# Patient Record
Sex: Female | Born: 1935 | Race: Black or African American | Hispanic: No | Smoking: Never smoker
Health system: Southern US, Community
[De-identification: ages and names within clinical notes are randomized; demographics above are authoritative.]

## PROBLEM LIST (undated history)

## (undated) DIAGNOSIS — I209 Angina pectoris, unspecified: Secondary | ICD-10-CM

## (undated) DIAGNOSIS — N3281 Overactive bladder: Secondary | ICD-10-CM

## (undated) DIAGNOSIS — I454 Nonspecific intraventricular block: Secondary | ICD-10-CM

## (undated) DIAGNOSIS — G4700 Insomnia, unspecified: Secondary | ICD-10-CM

## (undated) DIAGNOSIS — H9190 Unspecified hearing loss, unspecified ear: Secondary | ICD-10-CM

## (undated) DIAGNOSIS — E785 Hyperlipidemia, unspecified: Secondary | ICD-10-CM

## (undated) DIAGNOSIS — K259 Gastric ulcer, unspecified as acute or chronic, without hemorrhage or perforation: Secondary | ICD-10-CM

## (undated) DIAGNOSIS — D62 Acute posthemorrhagic anemia: Principal | ICD-10-CM

## (undated) DIAGNOSIS — R32 Unspecified urinary incontinence: Secondary | ICD-10-CM

## (undated) DIAGNOSIS — N189 Chronic kidney disease, unspecified: Secondary | ICD-10-CM

## (undated) DIAGNOSIS — G4730 Sleep apnea, unspecified: Secondary | ICD-10-CM

## (undated) DIAGNOSIS — N6019 Diffuse cystic mastopathy of unspecified breast: Secondary | ICD-10-CM

## (undated) DIAGNOSIS — I872 Venous insufficiency (chronic) (peripheral): Secondary | ICD-10-CM

## (undated) DIAGNOSIS — E119 Type 2 diabetes mellitus without complications: Secondary | ICD-10-CM

## (undated) DIAGNOSIS — R269 Unspecified abnormalities of gait and mobility: Secondary | ICD-10-CM

## (undated) DIAGNOSIS — E039 Hypothyroidism, unspecified: Secondary | ICD-10-CM

## (undated) DIAGNOSIS — D691 Qualitative platelet defects: Secondary | ICD-10-CM

## (undated) DIAGNOSIS — E049 Nontoxic goiter, unspecified: Secondary | ICD-10-CM

## (undated) DIAGNOSIS — I10 Essential (primary) hypertension: Secondary | ICD-10-CM

## (undated) DIAGNOSIS — L0390 Cellulitis, unspecified: Secondary | ICD-10-CM

## (undated) DIAGNOSIS — R2681 Unsteadiness on feet: Secondary | ICD-10-CM

## (undated) DIAGNOSIS — M1990 Unspecified osteoarthritis, unspecified site: Secondary | ICD-10-CM

## (undated) DIAGNOSIS — E559 Vitamin D deficiency, unspecified: Secondary | ICD-10-CM

## (undated) DIAGNOSIS — R209 Unspecified disturbances of skin sensation: Secondary | ICD-10-CM

## (undated) DIAGNOSIS — K449 Diaphragmatic hernia without obstruction or gangrene: Secondary | ICD-10-CM

## (undated) DIAGNOSIS — K219 Gastro-esophageal reflux disease without esophagitis: Secondary | ICD-10-CM

## (undated) DIAGNOSIS — R42 Dizziness and giddiness: Secondary | ICD-10-CM

## (undated) DIAGNOSIS — I517 Cardiomegaly: Secondary | ICD-10-CM

## (undated) DIAGNOSIS — Z9289 Personal history of other medical treatment: Secondary | ICD-10-CM

## (undated) DIAGNOSIS — R413 Other amnesia: Secondary | ICD-10-CM

## (undated) DIAGNOSIS — K5900 Constipation, unspecified: Secondary | ICD-10-CM

## (undated) DIAGNOSIS — R5381 Other malaise: Secondary | ICD-10-CM

## (undated) DIAGNOSIS — R609 Edema, unspecified: Secondary | ICD-10-CM

## (undated) DIAGNOSIS — I2510 Atherosclerotic heart disease of native coronary artery without angina pectoris: Secondary | ICD-10-CM

## (undated) HISTORY — DX: Unspecified osteoarthritis, unspecified site: M19.90

## (undated) HISTORY — DX: Chronic kidney disease, unspecified: N18.9

## (undated) HISTORY — PX: CATARACT EXTRACTION, BILATERAL: SHX1313

## (undated) HISTORY — DX: Venous insufficiency (chronic) (peripheral): I87.2

## (undated) HISTORY — DX: Unsteadiness on feet: R26.81

## (undated) HISTORY — DX: Atherosclerotic heart disease of native coronary artery without angina pectoris: I25.10

## (undated) HISTORY — DX: Dizziness and giddiness: R42

## (undated) HISTORY — DX: Unspecified disturbances of skin sensation: R20.9

## (undated) HISTORY — DX: Diaphragmatic hernia without obstruction or gangrene: K44.9

## (undated) HISTORY — DX: Vitamin D deficiency, unspecified: E55.9

## (undated) HISTORY — DX: Diffuse cystic mastopathy of unspecified breast: N60.19

## (undated) HISTORY — DX: Overactive bladder: N32.81

## (undated) HISTORY — PX: TONSILLECTOMY: SUR1361

## (undated) HISTORY — DX: Acute posthemorrhagic anemia: D62

## (undated) HISTORY — DX: Sleep apnea, unspecified: G47.30

## (undated) HISTORY — DX: Unspecified abnormalities of gait and mobility: R26.9

## (undated) HISTORY — DX: Insomnia, unspecified: G47.00

## (undated) HISTORY — DX: Hyperlipidemia, unspecified: E78.5

## (undated) HISTORY — DX: Other malaise: R53.81

## (undated) HISTORY — DX: Constipation, unspecified: K59.00

## (undated) HISTORY — DX: Unspecified urinary incontinence: R32

## (undated) HISTORY — DX: Edema, unspecified: R60.9

## (undated) HISTORY — DX: Nonspecific intraventricular block: I45.4

---
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
## 1997-03-04 HISTORY — PX: ROTATOR CUFF REPAIR: SHX139

## 1998-01-11 ENCOUNTER — Other Ambulatory Visit: Admission: RE | Admit: 1998-01-11 | Discharge: 1998-01-11 | Payer: Self-pay | Admitting: *Deleted

## 1999-01-23 ENCOUNTER — Other Ambulatory Visit: Admission: RE | Admit: 1999-01-23 | Discharge: 1999-01-23 | Payer: Self-pay | Admitting: *Deleted

## 1999-02-23 ENCOUNTER — Encounter: Admission: RE | Admit: 1999-02-23 | Discharge: 1999-02-23 | Payer: Self-pay | Admitting: *Deleted

## 1999-02-23 ENCOUNTER — Encounter: Payer: Self-pay | Admitting: *Deleted

## 2000-02-04 ENCOUNTER — Other Ambulatory Visit: Admission: RE | Admit: 2000-02-04 | Discharge: 2000-02-04 | Payer: Self-pay | Admitting: *Deleted

## 2000-02-27 ENCOUNTER — Encounter: Payer: Self-pay | Admitting: *Deleted

## 2000-02-27 ENCOUNTER — Encounter: Admission: RE | Admit: 2000-02-27 | Discharge: 2000-02-27 | Payer: Self-pay | Admitting: *Deleted

## 2000-03-04 HISTORY — PX: HIP SURGERY: SHX245

## 2000-10-15 ENCOUNTER — Encounter: Payer: Self-pay | Admitting: Specialist

## 2000-10-15 ENCOUNTER — Inpatient Hospital Stay (HOSPITAL_COMMUNITY): Admission: EM | Admit: 2000-10-15 | Discharge: 2000-10-20 | Payer: Self-pay | Admitting: Emergency Medicine

## 2000-10-20 ENCOUNTER — Inpatient Hospital Stay (HOSPITAL_COMMUNITY)
Admission: RE | Admit: 2000-10-20 | Discharge: 2000-11-14 | Payer: Self-pay | Admitting: Physical Medicine & Rehabilitation

## 2000-11-10 ENCOUNTER — Encounter: Payer: Self-pay | Admitting: Specialist

## 2000-11-11 ENCOUNTER — Encounter: Payer: Self-pay | Admitting: Physical Medicine & Rehabilitation

## 2001-06-23 ENCOUNTER — Encounter: Payer: Self-pay | Admitting: Internal Medicine

## 2001-06-23 ENCOUNTER — Encounter: Admission: RE | Admit: 2001-06-23 | Discharge: 2001-06-23 | Payer: Self-pay | Admitting: Internal Medicine

## 2001-12-29 ENCOUNTER — Ambulatory Visit (HOSPITAL_COMMUNITY): Admission: RE | Admit: 2001-12-29 | Discharge: 2001-12-29 | Payer: Self-pay | Admitting: Gastroenterology

## 2002-07-13 ENCOUNTER — Encounter: Payer: Self-pay | Admitting: Internal Medicine

## 2002-07-13 ENCOUNTER — Encounter: Admission: RE | Admit: 2002-07-13 | Discharge: 2002-07-13 | Payer: Self-pay | Admitting: Internal Medicine

## 2003-09-09 ENCOUNTER — Encounter: Admission: RE | Admit: 2003-09-09 | Discharge: 2003-09-09 | Payer: Self-pay | Admitting: Internal Medicine

## 2004-11-22 ENCOUNTER — Encounter: Admission: RE | Admit: 2004-11-22 | Discharge: 2004-11-22 | Payer: Self-pay | Admitting: Internal Medicine

## 2005-07-25 ENCOUNTER — Emergency Department (HOSPITAL_COMMUNITY): Admission: EM | Admit: 2005-07-25 | Discharge: 2005-07-25 | Payer: Self-pay | Admitting: Family Medicine

## 2005-09-26 ENCOUNTER — Encounter: Admission: RE | Admit: 2005-09-26 | Discharge: 2005-10-07 | Payer: Self-pay | Admitting: Internal Medicine

## 2005-10-28 ENCOUNTER — Encounter: Admission: RE | Admit: 2005-10-28 | Discharge: 2006-01-26 | Payer: Self-pay | Admitting: Internal Medicine

## 2005-12-03 ENCOUNTER — Encounter: Admission: RE | Admit: 2005-12-03 | Discharge: 2006-03-03 | Payer: Self-pay | Admitting: Internal Medicine

## 2006-01-21 ENCOUNTER — Encounter: Admission: RE | Admit: 2006-01-21 | Discharge: 2006-01-21 | Payer: Self-pay | Admitting: Internal Medicine

## 2006-08-28 ENCOUNTER — Encounter: Admission: RE | Admit: 2006-08-28 | Discharge: 2006-11-26 | Payer: Self-pay | Admitting: Specialist

## 2006-12-02 ENCOUNTER — Encounter: Admission: RE | Admit: 2006-12-02 | Discharge: 2006-12-16 | Payer: Self-pay | Admitting: Specialist

## 2006-12-16 ENCOUNTER — Encounter
Admission: RE | Admit: 2006-12-16 | Discharge: 2006-12-16 | Payer: Self-pay | Admitting: Physical Medicine & Rehabilitation

## 2007-04-02 ENCOUNTER — Encounter: Admission: RE | Admit: 2007-04-02 | Discharge: 2007-04-02 | Payer: Self-pay | Admitting: Internal Medicine

## 2007-07-14 ENCOUNTER — Encounter: Admission: RE | Admit: 2007-07-14 | Discharge: 2007-07-14 | Payer: Self-pay | Admitting: Gastroenterology

## 2008-04-11 ENCOUNTER — Encounter: Admission: RE | Admit: 2008-04-11 | Discharge: 2008-04-11 | Payer: Self-pay | Admitting: Internal Medicine

## 2008-10-06 ENCOUNTER — Encounter: Admission: RE | Admit: 2008-10-06 | Discharge: 2008-10-06 | Payer: Self-pay | Admitting: Internal Medicine

## 2009-04-21 ENCOUNTER — Encounter: Admission: RE | Admit: 2009-04-21 | Discharge: 2009-04-21 | Payer: Self-pay | Admitting: Internal Medicine

## 2010-02-27 ENCOUNTER — Encounter
Admission: RE | Admit: 2010-02-27 | Discharge: 2010-02-27 | Payer: Self-pay | Source: Home / Self Care | Attending: Internal Medicine | Admitting: Internal Medicine

## 2010-05-16 ENCOUNTER — Other Ambulatory Visit: Payer: Self-pay | Admitting: Internal Medicine

## 2010-05-16 DIAGNOSIS — Z1231 Encounter for screening mammogram for malignant neoplasm of breast: Secondary | ICD-10-CM

## 2010-05-25 ENCOUNTER — Ambulatory Visit
Admission: RE | Admit: 2010-05-25 | Discharge: 2010-05-25 | Disposition: A | Discharge status: Home / Self Care | Payer: Medicare Other | Source: Ambulatory Visit | Attending: Internal Medicine | Admitting: Internal Medicine

## 2010-05-25 DIAGNOSIS — Z1231 Encounter for screening mammogram for malignant neoplasm of breast: Secondary | ICD-10-CM

## 2010-07-20 NOTE — H&P (Signed)
[place]  Patient:    Jessica Hawkins, Jessica Hawkins                      MRN: [id number] Adm. Date:  [id number] Attending:  Erasmo Leventhal Dictator:   Sammuel Cooper. Mahar, P.A.                         History and Physical  DATE OF BIRTH:  07-Apr-1922  CHIEF COMPLAINT:  Right hip and groin pain.  HISTORY OF PRESENT ILLNESS:  Earlier this evening, the patient was in her home in the kitchen.  She was near the sink and slipped on some water.  She felt her hip twist and she fell to the ground.  She did have the immediate onset of right hip and groin pain and was unable to bear weight at the time.  She called some family members, who in turn called the ambulance, and she was transported here to [facility] via ambulance.  Prior to the injury, she did ambulate with only a cane for assistance, she did live independently and cared for herself.  ALLERGIES:  No known drug allergies.  MEDICATIONS: 1. Synthroid 0.1 mg one p.o. q.d. 2. Hydrochlorothiazide 25 mg one p.o. q.d.  PAST MEDICAL HISTORY:  Significant for hypertension and hypothyroidism.  PAST SURGICAL HISTORY:  A thyroidectomy approximately 20 years ago secondary to a goiter, left rotator cuff surgery in 1999 and a tonsillectomy as a child.  SOCIAL HISTORY:  Patient denies tobacco use and denies alcohol use.  She is married and lives in a private residence in [place] with her husband and one son.  She has been having some difficulty ambulating prior to the fall but has been able to do so with the use of a cane.  REVIEW OF SYSTEMS:  Negative with the exception of some urinary incontinence.  PHYSICAL EXAMINATION:  VITAL SIGNS:  Blood pressure is 150/91.  Respirations 16 and unlabored.  Pulse is 76 and regular.  Temperature is 97.2.  O2 saturation is 100% on room air.  GENERAL APPEARANCE:  Patient is a 77 year old African-American female who is alert and oriented and in no acute  distress.  She is pleasant and cooperative to exam.  She is well-nourished and well-groomed.  HEENT:  Head is normocephalic, atraumatic.  Pupils are equal, round and reactive to light and extraocular movement is intact.  NECK:  Soft and supple to palpation.  No thyromegaly noted.  No bruits appreciated.  CHEST:  Clear to auscultation bilaterally, anterior and posterior.  No wheezes, rales, rhonchi, stridor or friction rubs.  BREASTS:  Not pertinent and not examined.  HEART:  Normal S1, S2.  Regular rate and rhythm.  No murmurs, gallops or rubs appreciated.  ABDOMEN:  Soft and supple to palpation.  Positive bowel sounds throughout. Nondistended, nontender.  No organomegaly noted.  GU:  Not pertinent and not performed.  EXTREMITIES:  Right lower extremity is externally rotated and shortened as compared to the left lower extremity.  Dorsalis pedis and posterior tibialis pulses are intact and equal bilaterally.  Sensation is grossly intact to light touch.  Motion is grossly intact; patient is moving foot and ankle well and toes well.  SKIN:  Intact without any lesions, rashes or disruptions in the skin.  X-RAY FINDINGS:  X-ray revealed a right hip intertrochanteric fracture.  IMPRESSION: 1. Right hip intertrochanteric fracture. 2. Hypertension. 3. Hypothyroidism.  PLAN:  Plan is to admit to Dr. Benny Lennert for an open reduction and internal fixation of the right hip using a compression screw and sideplate on October 15, 2000. DD:  10/15/00 TD:  10/15/00 Job: [id number]

## 2010-07-20 NOTE — Op Note (Signed)
   NAME:  Jessica Hawkins, Jessica Hawkins                         ACCOUNT NO.:  [id number]   MEDICAL RECORD NO.:  [id number]                   PATIENT TYPE:  AMB   LOCATION:  ENDO                                 FACILITY:  [facility]   PHYSICIAN:  Petra Kuba, M.D.                 DATE OF BIRTH:  04-Jan-1923   DATE OF PROCEDURE:  12/29/2001  DATE OF DISCHARGE:                                 OPERATIVE REPORT   PROCEDURE:  Colonoscopy.   INDICATIONS:  Patient with change in bowel habits, due for colonic  screening.  Consent was signed after risks, benefits, methods, and options  thoroughly discussed in the office on multiple occasions.   MEDICATIONS:  Demerol 50 mg, Versed 4 mg.   DESCRIPTION OF PROCEDURE:  Rectal inspection was pertinent for small  external hemorrhoids.  Digital exam was negative.  The video pediatric  adjustable colonoscope was inserted, with moderate difficulty advanced  around the colon to the cecum.  This did require rolling her on her back,  various abdominal pressures.  No abnormality was seen on insertion.  The  cecum was identified by the appendiceal orifice and the ileocecal valve.  The prep was adequate.  There was some liquid stool that required washing  and suctioning, but on slow withdrawal through the colon she had a tortuous,  long, looping colon.  When we fell back around a loop, we did try to  readvanced around the curves to decrease chances of missing things but on  slow withdrawal back to the rectum, no abnormalities were seen.  Once back  in the rectum, the scope was then retroflexed, pertinent for some tiny  internal hemorrhoids.  The scope was straightened and readvanced a short way  up the left side of the colon, air was suctioned, and scope removed.  The  patient tolerated the procedure well.  There was no obvious immediate  complication.   ENDOSCOPIC DIAGNOSES:  1. Internal-external hemorrhoids.  2. Tortuous, long, looping colon.  3. Otherwise normal to the  cecum.   PLAN:  Happy to see back p.r.n.  Return care back to Dr. Chilton Si for the  customary health care maintenance to include yearly rectals and guaiacs, and  consider repeat screening in five to 10 years if doing well medically, and  will leave that to Dr. Chilton Si.                                               Petra Kuba, M.D.    MEM/MEDQ  D:  12/29/2001  T:  12/29/2001  Job:  [id number]   cc:   Lenon Curt. Chilton Si, M.D.

## 2010-07-20 NOTE — Discharge Summary (Signed)
[place]. [facility]  Patient:    Jessica Hawkins, Jessica Hawkins Visit Number: [id number] MRN: [id number]          Service Type: [facility] Location: [id number] Attending Physician:  Herold Harms Dictated by:   Mcarthur Rossetti. Angiulli, P.A. Adm. Date:  10/20/2000 Disc. Date: 10/29/00   CC:         Dr. Hayden Rasmussen  Dr. Janey Greaser   Discharge Summary  DISCHARGE DIAGNOSES: 1. Right comminuted displaced intertrochanteric hip fracture, status post    open reduction and internal fixation October 15, 2000. 2. Hypokalemia. 3. Hypothyroidism. 4. Hypertension.  HISTORY OF PRESENT ILLNESS:  A 77 year old female admitted October 15, 2000, after a fall without loss of consciousness, sustaining a right comminuted displaced intertrochanteric hip fracture.  She underwent open reduction and internal fixation October 15, 2000, per Dr. Hayden Rasmussen.  She was placed on Coumadin for deep venous thrombosis prophylaxis and touchdown weightbearing. Postoperative pain management.  Mild hypokalemia at 3.2 and supplemented. Minimal assistance for ambulation with a standard walker, minimal assistance transfers.  Latest chemistries with an INR of 1.8.  Hemoglobin 10.2. Admitted for comprehensive rehab program.  PAST MEDICAL HISTORY:  See discharge diagnoses.  PAST SURGICAL HISTORY:  Tonsillectomy, hysterectomy, and left rotator cuff tear.  ALLERGIES:  No known drug allergies.  PRIMARY M.D.:  Dr. Janey Greaser.  MEDICATIONS PRIOR TO ADMISSION:  Synthroid and hydrochlorothiazide.  SOCIAL HISTORY:  No tobacco.  No alcohol.  She lives [place] her husband and [place]  She was independent with a cane prior to admission.  She lives in a two-level home and stays on the first floor. There are four steps to entry of home.  Son works night shift. Husband can assist on discharge.  HOSPITAL COURSE:  The patient did well while in rehabilitation services with therapies initiated on a  b.i.d. basis. The following issues were followed during the patients rehab course.  Pertaining to Ms. Tokar right comminuted displaced intertrochanteric hip fracture, she had undergone open reduction and internal fixation October 15, 2000. Surgical site healing nicely. Staples had been removed.  No signs of infection.  She was still touchdown weightbearing and would follow up with Dr. Valma Cava.  She continued on Coumadin for deep venous thrombosis prophylaxis. Venous Doppler studies prior to her discharge were negative and she would complete Coumadin protocol.  There were no bleeding episodes.  She remained on hormone supplement for hypothyroidism. She had early bouts of hypokalemia in her hospital course. This had resolved with latest potassium of 3.6.  Blood pressures remained controlled on low doses of hydrochlorothiazide. She had some mild postoperative anemia.  She continued on iron supplement. Latest hemoglobin 9.3. She remained asymptomatic with no chest pain, no shortness of breath.  Overall for her functional mobility, she was ambulating household functional distances with a walker, essentially independent to standby assist in all areas of activities of daily living, needing some assistance for right lower extremity after recent surgical repair of fracture.  Plan will be for home health physical, occupational therapy and a nurse.  LABORATORY:  Latest labs showed an INR of 2.4, hemoglobin 9.3, sodium 138, BUN 11, creatinine 0.6. She was to be discharged to home.  She did have one follow-up hemoglobin prior to discharge that also improved to 10.3.  DISCHARGE MEDICATIONS: 1. Coumadin daily with dose to be established at the time of discharge to be    completed by November 15, 2000.  Also, she had been  receiving 5 mg of    Coumadin. 2. Os-Cal 500 mg three times daily. 3. Synthroid 100 mcg daily. 4. Hydrochlorothiazide 25 mg daily. 5. Trinsicon one capsule twice daily. 6.  Tylox as needed for pain.  ACTIVITY:  Touchdown weightbearing with walker.  DIET:  Regular.  WOUND CARE:  Cleanse incision daily with warm soap and water.  SPECIAL INSTRUCTIONS:  No aspirin or ibuprofen while on Coumadin.  Home health nurse to be arranged for Coumadin therapy.  Home health physical and occupational therapy had also been arranged.  FOLLOW-UP:  She should follow up with Dr. Hayden Rasmussen in two weeks for follow-up of her recent hip repair, also with Dr. Janey Greaser for medical management. Dictated by:   Mcarthur Rossetti. Angiulli, P.A. Attending Physician:  Herold Harms DD:  10/28/00 TD:  10/28/00 Job: [id number]

## 2011-03-05 DIAGNOSIS — D62 Acute posthemorrhagic anemia: Secondary | ICD-10-CM

## 2011-03-05 DIAGNOSIS — D691 Qualitative platelet defects: Secondary | ICD-10-CM

## 2011-03-05 HISTORY — DX: Qualitative platelet defects: D69.1

## 2011-03-05 HISTORY — DX: Acute posthemorrhagic anemia: D62

## 2011-03-13 DIAGNOSIS — E119 Type 2 diabetes mellitus without complications: Secondary | ICD-10-CM | POA: Diagnosis not present

## 2011-03-13 DIAGNOSIS — I10 Essential (primary) hypertension: Secondary | ICD-10-CM | POA: Diagnosis not present

## 2011-03-13 DIAGNOSIS — Z23 Encounter for immunization: Secondary | ICD-10-CM | POA: Diagnosis not present

## 2011-03-13 DIAGNOSIS — E039 Hypothyroidism, unspecified: Secondary | ICD-10-CM | POA: Diagnosis not present

## 2011-03-20 DIAGNOSIS — R198 Other specified symptoms and signs involving the digestive system and abdomen: Secondary | ICD-10-CM | POA: Diagnosis not present

## 2011-03-20 DIAGNOSIS — K5900 Constipation, unspecified: Secondary | ICD-10-CM | POA: Diagnosis not present

## 2011-03-27 DIAGNOSIS — M1710 Unilateral primary osteoarthritis, unspecified knee: Secondary | ICD-10-CM | POA: Diagnosis not present

## 2011-03-27 DIAGNOSIS — IMO0002 Reserved for concepts with insufficient information to code with codable children: Secondary | ICD-10-CM | POA: Diagnosis not present

## 2011-03-28 DIAGNOSIS — E119 Type 2 diabetes mellitus without complications: Secondary | ICD-10-CM | POA: Diagnosis not present

## 2011-04-09 DIAGNOSIS — M5137 Other intervertebral disc degeneration, lumbosacral region: Secondary | ICD-10-CM | POA: Diagnosis not present

## 2011-04-09 DIAGNOSIS — M999 Biomechanical lesion, unspecified: Secondary | ICD-10-CM | POA: Diagnosis not present

## 2011-04-30 DIAGNOSIS — R42 Dizziness and giddiness: Secondary | ICD-10-CM | POA: Diagnosis not present

## 2011-04-30 DIAGNOSIS — R609 Edema, unspecified: Secondary | ICD-10-CM | POA: Diagnosis not present

## 2011-04-30 DIAGNOSIS — M2550 Pain in unspecified joint: Secondary | ICD-10-CM | POA: Diagnosis not present

## 2011-05-03 DIAGNOSIS — M79609 Pain in unspecified limb: Secondary | ICD-10-CM | POA: Diagnosis not present

## 2011-05-03 DIAGNOSIS — B351 Tinea unguium: Secondary | ICD-10-CM | POA: Diagnosis not present

## 2011-05-06 DIAGNOSIS — M1710 Unilateral primary osteoarthritis, unspecified knee: Secondary | ICD-10-CM | POA: Diagnosis not present

## 2011-05-06 DIAGNOSIS — IMO0002 Reserved for concepts with insufficient information to code with codable children: Secondary | ICD-10-CM | POA: Diagnosis not present

## 2011-05-07 DIAGNOSIS — M999 Biomechanical lesion, unspecified: Secondary | ICD-10-CM | POA: Diagnosis not present

## 2011-05-07 DIAGNOSIS — M5137 Other intervertebral disc degeneration, lumbosacral region: Secondary | ICD-10-CM | POA: Diagnosis not present

## 2011-05-13 DIAGNOSIS — M1710 Unilateral primary osteoarthritis, unspecified knee: Secondary | ICD-10-CM | POA: Diagnosis not present

## 2011-05-13 DIAGNOSIS — IMO0002 Reserved for concepts with insufficient information to code with codable children: Secondary | ICD-10-CM | POA: Diagnosis not present

## 2011-05-20 DIAGNOSIS — IMO0002 Reserved for concepts with insufficient information to code with codable children: Secondary | ICD-10-CM | POA: Diagnosis not present

## 2011-05-20 DIAGNOSIS — M1710 Unilateral primary osteoarthritis, unspecified knee: Secondary | ICD-10-CM | POA: Diagnosis not present

## 2011-05-25 ENCOUNTER — Emergency Department (INDEPENDENT_AMBULATORY_CARE_PROVIDER_SITE_OTHER)
Admission: EM | Admit: 2011-05-25 | Discharge: 2011-05-25 | Disposition: A | Discharge status: Home / Self Care | Payer: Medicare Other | Source: Home / Self Care | Attending: Emergency Medicine | Admitting: Emergency Medicine

## 2011-05-25 ENCOUNTER — Encounter (HOSPITAL_COMMUNITY): Payer: Self-pay | Admitting: Emergency Medicine

## 2011-05-25 DIAGNOSIS — L03119 Cellulitis of unspecified part of limb: Secondary | ICD-10-CM

## 2011-05-25 DIAGNOSIS — L02419 Cutaneous abscess of limb, unspecified: Secondary | ICD-10-CM | POA: Diagnosis not present

## 2011-05-25 DIAGNOSIS — R609 Edema, unspecified: Secondary | ICD-10-CM

## 2011-05-25 DIAGNOSIS — L03115 Cellulitis of right lower limb: Secondary | ICD-10-CM

## 2011-05-25 HISTORY — DX: Unspecified osteoarthritis, unspecified site: M19.90

## 2011-05-25 HISTORY — DX: Essential (primary) hypertension: I10

## 2011-05-25 MED ORDER — DOXYCYCLINE HYCLATE 100 MG PO TABS: 100.0000 mg | ORAL_TABLET | Freq: Two times a day (BID) | ORAL | Status: AC

## 2011-05-25 NOTE — ED Provider Notes (Signed)
History     CSN: [id number]  Arrival date & time 05/25/11  1002   First MD Initiated Contact with Patient 05/25/11 1015      Chief Complaint  Patient presents with  . Leg Swelling    (Consider location/radiation/quality/duration/timing/severity/associated sxs/prior treatment) HPI Comments: Patient presents urgent care with worsening of her right lower leg swelling with a localized area of redness and tenderness she has seen and observed spontaneous purulent drainage from it. Patient seemed to describe that it has been red and tender for at least 4-5 days. No recent injuries, no falls. Patient denies any further symptoms no fevers no chills no changes in appetite. Is accompanied by a relative. She recalls no injuries to the right lower leg and does not know where that scabbed area on her right inner aspect of her right lower leg came from.  The history is provided by the patient.    Past Medical History  Diagnosis Date  . Hypertension   . Diabetes mellitus   . Arthritis   . Osteoporosis     Past Surgical History  Procedure Date  . Hip surgery   . Thyroidectomy     No family history on file.  History  Substance Use Topics  . Smoking status: Never Smoker   . Smokeless tobacco: Not on file  . Alcohol Use: No    OB History    Grav Para Term Preterm Abortions TAB SAB Ect Mult Living                  Review of Systems  Constitutional: Negative for fever, chills, activity change, appetite change and fatigue.  HENT: Negative for neck pain and ear discharge.   Cardiovascular: Positive for leg swelling. Negative for chest pain and palpitations.  Skin: Positive for rash and wound.    Allergies  Review of patient's allergies indicates no known allergies.  Home Medications   Current Outpatient Rx  Name Route Sig Dispense Refill  . ASPIRIN 81 MG PO TABS Oral Take 81 mg by mouth daily.    . FUROSEMIDE 40 MG PO TABS Oral Take 40 mg by mouth daily.    Marland Kitchen GLUCOSAMINE  SULFATE PO Oral Take by mouth once.    Marland Kitchen SYNTHROID PO Oral Take 25 mcg by mouth.     Marland Kitchen LOSARTAN POTASSIUM-HCTZ 50-12.5 MG PO TABS Oral Take 1 tablet by mouth daily. Med stopped at last appt with physician in February    . OMEPRAZOLE 20 MG PO CPDR Oral Take 20 mg by mouth daily. Taken as needed    . DETROL PO Oral Take 1 capsule by mouth once.     Marland Kitchen DOXYCYCLINE HYCLATE 100 MG PO TABS Oral Take 1 tablet (100 mg total) by mouth 2 (two) times daily. 20 tablet 0    BP 168/63  Pulse 72  Temp(Src) 97.9 F (36.6 C) (Oral)  Resp 19  SpO2 100%  Physical Exam  Nursing note and vitals reviewed. Constitutional: No distress.  Pulmonary/Chest: No respiratory distress.  Musculoskeletal: She exhibits edema and tenderness.       Legs: Skin: Skin is warm. She is not diaphoretic. There is erythema.    ED Course  Procedures (including critical care time)  Labs Reviewed - No data to display No results found.   1. Cellulitis of right lower leg   2. Chronic edema       MDM  Right lower leg anterior aspect of tibia with a localized cellulitic process and associated tenderness  and soft tissue swelling superimposed on chronic bilateral edema. Patient looks comfortable is afebrile. We had to discern multiple medications she is chronically on some clear he frequently she is taking them including Lasix. Followup was recommended in 48 hours with [place] or her primary care Dr.        Jimmie Molly, MD 05/25/11 [phone]

## 2011-05-25 NOTE — Discharge Instructions (Signed)
  Return in 2 days for recheck, or followup with your primary care Dr. Melene Muller with antibiotics today, expect tenderness to improve in the next 48 hours if worsening swelling and tenderness or any fevers (above 100.4) should go to the emergency department for further treatment.  Cellulitis Cellulitis is an infection of the tissue under the skin. The infected area is usually red and tender. This is caused by germs. These germs enter the body through cuts or sores. This usually happens in the arms or lower legs. HOME CARE   Take your medicine as told. Finish it even if you start to feel better.   If the infection is on the arm or leg, keep it raised (elevated).   Use a warm cloth on the infected area several times a day.   See your doctor for a follow-up visit as told.  GET HELP RIGHT AWAY IF:   You are tired or confused.   You throw up (vomit).   You have watery poop (diarrhea).   You feel ill and have muscle aches.   You have a fever.  MAKE SURE YOU:   Understand these instructions.   Will watch your condition.   Will get help right away if you are not doing well or get worse.  Document Released: 08/07/2007 Document Revised: 02/07/2011 Document Reviewed: 01/20/2009 [facility] Patient Information 2012 [place], [place].

## 2011-05-25 NOTE — ED Notes (Signed)
Son returned with medications

## 2011-05-25 NOTE — ED Notes (Signed)
Bilateral leg swelling.  Reports chronic leg swelling, but over the past few days swelling has increased and is more than she has ever had.  3 plus edema.  Patient also has a scabbed area to right inner, mid-lower leg.  Redness and increased tenderness around this wound.  Patient reports pus drainage.  Noted much smaller area to left, lateral lower leg.  Unknown injuries.

## 2011-05-25 NOTE — ED Notes (Signed)
Son has gone to patient's home to get medication

## 2011-05-27 ENCOUNTER — Emergency Department (INDEPENDENT_AMBULATORY_CARE_PROVIDER_SITE_OTHER)
Admission: EM | Admit: 2011-05-27 | Discharge: 2011-05-27 | Disposition: A | Discharge status: Home / Self Care | Payer: Medicare Other | Source: Home / Self Care | Attending: Emergency Medicine | Admitting: Emergency Medicine

## 2011-05-27 ENCOUNTER — Encounter (HOSPITAL_COMMUNITY): Payer: Self-pay | Admitting: *Deleted

## 2011-05-27 DIAGNOSIS — L0291 Cutaneous abscess, unspecified: Secondary | ICD-10-CM | POA: Diagnosis not present

## 2011-05-27 DIAGNOSIS — L0390 Cellulitis, unspecified: Secondary | ICD-10-CM

## 2011-05-27 NOTE — Discharge Instructions (Signed)
Your exam was improved in comparison with 2 days ago continue with your antibiotics as discussed if any further concerns or any worsening should return for recheck.   Cellulitis Cellulitis is an infection of the tissue under the skin. The infected area is usually red and tender. This is caused by germs. These germs enter the body through cuts or sores. This usually happens in the arms or lower legs. HOME CARE   Take your medicine as told. Finish it even if you start to feel better.   If the infection is on the arm or leg, keep it raised (elevated).   Use a warm cloth on the infected area several times a day.   See your doctor for a follow-up visit as told.  GET HELP RIGHT AWAY IF:   You are tired or confused.   You throw up (vomit).   You have watery poop (diarrhea).   You feel ill and have muscle aches.   You have a fever.  MAKE SURE YOU:   Understand these instructions.   Will watch your condition.   Will get help right away if you are not doing well or get worse.  Document Released: 08/07/2007 Document Revised: 02/07/2011 Document Reviewed: 01/20/2009 [facility] Patient Information 2012 [place], [place].

## 2011-05-27 NOTE — ED Notes (Signed)
Pt  Reports   She  Is  Here  For  Wound  Check of  r  Lower  Leg     Seen  ucc  2  Days  ago

## 2011-05-27 NOTE — ED Provider Notes (Signed)
History     CSN: [id number]  Arrival date & time 05/27/11  1152   First MD Initiated Contact with Patient 05/27/11 1317      Chief Complaint  Patient presents with  . Wound Check    (Consider location/radiation/quality/duration/timing/severity/associated sxs/prior treatment) HPI Comments: Patient returns today for followup for right lower leg cellulitis. "Feels and looks like the swelling is going down". Home taking the antibiotics as you told me" its. I think is getting better".  Patient denies any fevers, chills, or worsening swelling or redness.  Patient is a 78 y.o. female presenting with wound check. The history is provided by the patient.  Wound Check  She was treated in the ED 5 to 10 days ago. Treatments since wound repair include oral antibiotics. There has been no drainage from the wound. The redness has improved. The swelling has improved. The pain has not changed.    Past Medical History  Diagnosis Date  . Hypertension   . Diabetes mellitus   . Arthritis   . Osteoporosis     Past Surgical History  Procedure Date  . Hip surgery   . Thyroidectomy     No family history on file.  History  Substance Use Topics  . Smoking status: Never Smoker   . Smokeless tobacco: Not on file  . Alcohol Use: No    OB History    Grav Para Term Preterm Abortions TAB SAB Ect Mult Living                  Review of Systems  Constitutional: Negative for fever and chills.  Cardiovascular: Positive for leg swelling.  Musculoskeletal: Negative for joint swelling.  Skin: Positive for wound.    Allergies  Review of patient's allergies indicates no known allergies.  Home Medications   Current Outpatient Rx  Name Route Sig Dispense Refill  . ASPIRIN 81 MG PO TABS Oral Take 81 mg by mouth daily.    Marland Kitchen DOXYCYCLINE HYCLATE 100 MG PO TABS Oral Take 1 tablet (100 mg total) by mouth 2 (two) times daily. 20 tablet 0  . FUROSEMIDE 40 MG PO TABS Oral Take 40 mg by mouth daily.      Marland Kitchen GLUCOSAMINE SULFATE PO Oral Take by mouth once.    Marland Kitchen SYNTHROID PO Oral Take 25 mcg by mouth.     Marland Kitchen LOSARTAN POTASSIUM-HCTZ 50-12.5 MG PO TABS Oral Take 1 tablet by mouth daily. Med stopped at last appt with physician in February    . OMEPRAZOLE 20 MG PO CPDR Oral Take 20 mg by mouth daily. Taken as needed    . DETROL PO Oral Take 1 capsule by mouth once.       BP 139/51  Pulse 76  Temp(Src) 98.2 F (36.8 C) (Oral)  Resp 20  SpO2 100%  Physical Exam  Nursing note and vitals reviewed. Constitutional: She is oriented to person, place, and time. She appears well-developed and well-nourished.  Musculoskeletal: She exhibits tenderness.       Right ankle: She exhibits swelling.       Legs: Neurological: She is oriented to person, place, and time.  Skin: Rash noted. There is erythema.    ED Course  Procedures (including critical care time)  Labs Reviewed - No data to display No results found.   1. Cellulitis       MDM  Followup visit for right lower extremity cellulitis. Patient and myself believe moderate improvement decreased erythema and decreased swelling tenderness seem to  have persisted. Patient encouraged to continue with antibiotics to return if any worsening or new symptoms patient agree with plan of care and understood instructions        Jimmie Molly, MD 05/27/11 1453

## 2011-05-28 ENCOUNTER — Telehealth (HOSPITAL_COMMUNITY): Payer: Self-pay | Admitting: *Deleted

## 2011-05-28 NOTE — ED Notes (Signed)
Pt. called on VM @ 1346 and called again now asking if she was infectious to others with her cellulitis. States it is getting better, and is taking her antibiotics.  States her son is coming home from his group home for Easter. I told her she did not have any cultures done, but she has been on Doxycycline since 3/23 so she is not infectious to others. Pt. very happy with this information and thanked me. [name] 05/28/2011

## 2011-05-29 ENCOUNTER — Ambulatory Visit
Admission: RE | Admit: 2011-05-29 | Discharge: 2011-05-29 | Disposition: A | Discharge status: Home / Self Care | Payer: Medicare Other | Source: Ambulatory Visit | Attending: Internal Medicine | Admitting: Internal Medicine

## 2011-05-29 ENCOUNTER — Other Ambulatory Visit: Payer: Self-pay | Admitting: Internal Medicine

## 2011-05-29 DIAGNOSIS — L0291 Cutaneous abscess, unspecified: Secondary | ICD-10-CM | POA: Diagnosis not present

## 2011-05-29 DIAGNOSIS — I10 Essential (primary) hypertension: Secondary | ICD-10-CM | POA: Diagnosis not present

## 2011-05-29 DIAGNOSIS — S81809A Unspecified open wound, unspecified lower leg, initial encounter: Secondary | ICD-10-CM | POA: Diagnosis not present

## 2011-05-29 DIAGNOSIS — T148XXA Other injury of unspecified body region, initial encounter: Secondary | ICD-10-CM

## 2011-05-29 DIAGNOSIS — L0390 Cellulitis, unspecified: Secondary | ICD-10-CM | POA: Diagnosis not present

## 2011-05-29 DIAGNOSIS — E119 Type 2 diabetes mellitus without complications: Secondary | ICD-10-CM | POA: Diagnosis not present

## 2011-05-29 DIAGNOSIS — R269 Unspecified abnormalities of gait and mobility: Secondary | ICD-10-CM | POA: Diagnosis not present

## 2011-05-29 DIAGNOSIS — R609 Edema, unspecified: Secondary | ICD-10-CM | POA: Diagnosis not present

## 2011-06-13 DIAGNOSIS — M25569 Pain in unspecified knee: Secondary | ICD-10-CM | POA: Diagnosis not present

## 2011-06-13 DIAGNOSIS — M159 Polyosteoarthritis, unspecified: Secondary | ICD-10-CM | POA: Diagnosis not present

## 2011-06-17 DIAGNOSIS — M159 Polyosteoarthritis, unspecified: Secondary | ICD-10-CM | POA: Diagnosis not present

## 2011-06-17 DIAGNOSIS — M25569 Pain in unspecified knee: Secondary | ICD-10-CM | POA: Diagnosis not present

## 2011-06-19 DIAGNOSIS — M159 Polyosteoarthritis, unspecified: Secondary | ICD-10-CM | POA: Diagnosis not present

## 2011-06-19 DIAGNOSIS — M25569 Pain in unspecified knee: Secondary | ICD-10-CM | POA: Diagnosis not present

## 2011-06-21 DIAGNOSIS — M25569 Pain in unspecified knee: Secondary | ICD-10-CM | POA: Diagnosis not present

## 2011-06-21 DIAGNOSIS — M159 Polyosteoarthritis, unspecified: Secondary | ICD-10-CM | POA: Diagnosis not present

## 2011-06-24 DIAGNOSIS — M159 Polyosteoarthritis, unspecified: Secondary | ICD-10-CM | POA: Diagnosis not present

## 2011-06-24 DIAGNOSIS — M25569 Pain in unspecified knee: Secondary | ICD-10-CM | POA: Diagnosis not present

## 2011-06-26 DIAGNOSIS — M25569 Pain in unspecified knee: Secondary | ICD-10-CM | POA: Diagnosis not present

## 2011-06-26 DIAGNOSIS — M159 Polyosteoarthritis, unspecified: Secondary | ICD-10-CM | POA: Diagnosis not present

## 2011-06-28 DIAGNOSIS — M159 Polyosteoarthritis, unspecified: Secondary | ICD-10-CM | POA: Diagnosis not present

## 2011-06-28 DIAGNOSIS — M25569 Pain in unspecified knee: Secondary | ICD-10-CM | POA: Diagnosis not present

## 2011-06-29 ENCOUNTER — Emergency Department (HOSPITAL_COMMUNITY): Payer: Medicare Other

## 2011-06-29 ENCOUNTER — Encounter (HOSPITAL_COMMUNITY): Payer: Self-pay | Admitting: Neurology

## 2011-06-29 ENCOUNTER — Inpatient Hospital Stay (HOSPITAL_COMMUNITY): Payer: Medicare Other

## 2011-06-29 ENCOUNTER — Encounter (HOSPITAL_COMMUNITY): Payer: Self-pay | Admitting: Anesthesiology

## 2011-06-29 ENCOUNTER — Encounter (HOSPITAL_COMMUNITY)
Admission: EM | Disposition: A | Discharge status: Skilled Nursing Facility | Payer: Self-pay | Source: Home / Self Care | Attending: Family Medicine

## 2011-06-29 ENCOUNTER — Inpatient Hospital Stay (HOSPITAL_COMMUNITY): Payer: Medicare Other | Admitting: Anesthesiology

## 2011-06-29 ENCOUNTER — Inpatient Hospital Stay (HOSPITAL_COMMUNITY)
Admission: EM | Admit: 2011-06-29 | Discharge: 2011-07-08 | DRG: 481 | Disposition: A | Discharge status: Skilled Nursing Facility | Payer: Medicare Other | Attending: Family Medicine | Admitting: Family Medicine

## 2011-06-29 DIAGNOSIS — M1990 Unspecified osteoarthritis, unspecified site: Secondary | ICD-10-CM | POA: Diagnosis not present

## 2011-06-29 DIAGNOSIS — N179 Acute kidney failure, unspecified: Secondary | ICD-10-CM | POA: Diagnosis not present

## 2011-06-29 DIAGNOSIS — Y92410 Unspecified street and highway as the place of occurrence of the external cause: Secondary | ICD-10-CM

## 2011-06-29 DIAGNOSIS — Z7982 Long term (current) use of aspirin: Secondary | ICD-10-CM | POA: Diagnosis not present

## 2011-06-29 DIAGNOSIS — S7223XA Displaced subtrochanteric fracture of unspecified femur, initial encounter for closed fracture: Secondary | ICD-10-CM | POA: Diagnosis not present

## 2011-06-29 DIAGNOSIS — S72143A Displaced intertrochanteric fracture of unspecified femur, initial encounter for closed fracture: Secondary | ICD-10-CM

## 2011-06-29 DIAGNOSIS — S2220XA Unspecified fracture of sternum, initial encounter for closed fracture: Secondary | ICD-10-CM | POA: Diagnosis not present

## 2011-06-29 DIAGNOSIS — E119 Type 2 diabetes mellitus without complications: Secondary | ICD-10-CM | POA: Diagnosis present

## 2011-06-29 DIAGNOSIS — D691 Qualitative platelet defects: Secondary | ICD-10-CM

## 2011-06-29 DIAGNOSIS — R079 Chest pain, unspecified: Secondary | ICD-10-CM | POA: Diagnosis not present

## 2011-06-29 DIAGNOSIS — E039 Hypothyroidism, unspecified: Secondary | ICD-10-CM | POA: Diagnosis present

## 2011-06-29 DIAGNOSIS — Z79899 Other long term (current) drug therapy: Secondary | ICD-10-CM

## 2011-06-29 DIAGNOSIS — D62 Acute posthemorrhagic anemia: Secondary | ICD-10-CM | POA: Diagnosis not present

## 2011-06-29 DIAGNOSIS — K2100 Gastro-esophageal reflux disease with esophagitis, without bleeding: Secondary | ICD-10-CM | POA: Diagnosis not present

## 2011-06-29 DIAGNOSIS — I10 Essential (primary) hypertension: Secondary | ICD-10-CM | POA: Diagnosis present

## 2011-06-29 DIAGNOSIS — J90 Pleural effusion, not elsewhere classified: Secondary | ICD-10-CM | POA: Diagnosis not present

## 2011-06-29 DIAGNOSIS — M25559 Pain in unspecified hip: Secondary | ICD-10-CM | POA: Diagnosis not present

## 2011-06-29 DIAGNOSIS — R58 Hemorrhage, not elsewhere classified: Secondary | ICD-10-CM | POA: Diagnosis not present

## 2011-06-29 DIAGNOSIS — M12869 Other specific arthropathies, not elsewhere classified, unspecified knee: Secondary | ICD-10-CM | POA: Diagnosis present

## 2011-06-29 DIAGNOSIS — K5900 Constipation, unspecified: Secondary | ICD-10-CM | POA: Diagnosis not present

## 2011-06-29 DIAGNOSIS — S72142A Displaced intertrochanteric fracture of left femur, initial encounter for closed fracture: Secondary | ICD-10-CM | POA: Diagnosis present

## 2011-06-29 DIAGNOSIS — R109 Unspecified abdominal pain: Secondary | ICD-10-CM | POA: Diagnosis not present

## 2011-06-29 DIAGNOSIS — M810 Age-related osteoporosis without current pathological fracture: Secondary | ICD-10-CM | POA: Diagnosis present

## 2011-06-29 DIAGNOSIS — M069 Rheumatoid arthritis, unspecified: Secondary | ICD-10-CM | POA: Diagnosis not present

## 2011-06-29 DIAGNOSIS — S20219A Contusion of unspecified front wall of thorax, initial encounter: Secondary | ICD-10-CM | POA: Diagnosis present

## 2011-06-29 DIAGNOSIS — M542 Cervicalgia: Secondary | ICD-10-CM | POA: Diagnosis not present

## 2011-06-29 DIAGNOSIS — D696 Thrombocytopenia, unspecified: Secondary | ICD-10-CM | POA: Diagnosis not present

## 2011-06-29 DIAGNOSIS — S300XXA Contusion of lower back and pelvis, initial encounter: Secondary | ICD-10-CM | POA: Diagnosis not present

## 2011-06-29 DIAGNOSIS — R51 Headache: Secondary | ICD-10-CM | POA: Diagnosis not present

## 2011-06-29 DIAGNOSIS — J9819 Other pulmonary collapse: Secondary | ICD-10-CM | POA: Diagnosis not present

## 2011-06-29 DIAGNOSIS — S72009D Fracture of unspecified part of neck of unspecified femur, subsequent encounter for closed fracture with routine healing: Secondary | ICD-10-CM | POA: Diagnosis not present

## 2011-06-29 DIAGNOSIS — IMO0002 Reserved for concepts with insufficient information to code with codable children: Secondary | ICD-10-CM | POA: Diagnosis not present

## 2011-06-29 DIAGNOSIS — S301XXA Contusion of abdominal wall, initial encounter: Secondary | ICD-10-CM | POA: Diagnosis not present

## 2011-06-29 DIAGNOSIS — J8410 Pulmonary fibrosis, unspecified: Secondary | ICD-10-CM | POA: Diagnosis not present

## 2011-06-29 DIAGNOSIS — K219 Gastro-esophageal reflux disease without esophagitis: Secondary | ICD-10-CM | POA: Diagnosis not present

## 2011-06-29 DIAGNOSIS — S72009A Fracture of unspecified part of neck of unspecified femur, initial encounter for closed fracture: Secondary | ICD-10-CM | POA: Diagnosis not present

## 2011-06-29 DIAGNOSIS — I517 Cardiomegaly: Secondary | ICD-10-CM | POA: Diagnosis not present

## 2011-06-29 DIAGNOSIS — Z5189 Encounter for other specified aftercare: Secondary | ICD-10-CM | POA: Diagnosis not present

## 2011-06-29 HISTORY — PX: FEMUR IM NAIL: SHX1597

## 2011-06-29 HISTORY — DX: Essential (primary) hypertension: I10

## 2011-06-29 HISTORY — DX: Gastro-esophageal reflux disease without esophagitis: K21.9

## 2011-06-29 HISTORY — DX: Hypothyroidism, unspecified: E03.9

## 2011-06-29 HISTORY — DX: Qualitative platelet defects: D69.1

## 2011-06-29 LAB — DIFFERENTIAL
Eosinophils Relative: 1 % (ref 0–5)
Lymphocytes Relative: 34 % (ref 12–46)
Lymphs Abs: 3.1 10*3/uL (ref 0.7–4.0)
Monocytes Relative: 7 % (ref 3–12)
Neutro Abs: 5.3 10*3/uL (ref 1.7–7.7)

## 2011-06-29 LAB — RAPID URINE DRUG SCREEN, HOSP PERFORMED
Barbiturates: NOT DETECTED
Cocaine: NOT DETECTED

## 2011-06-29 LAB — CBC
HCT: 36.5 % (ref 36.0–46.0)
Hemoglobin: 12 g/dL (ref 12.0–15.0)
MCHC: 32.9 g/dL (ref 30.0–36.0)
Platelets: 168 10*3/uL (ref 150–400)
RBC: 4.4 MIL/uL (ref 3.87–5.11)
RDW: 14.6 % (ref 11.5–15.5)
WBC: 9.1 10*3/uL (ref 4.0–10.5)

## 2011-06-29 LAB — COMPREHENSIVE METABOLIC PANEL
ALT: 23 U/L (ref 0–35)
Albumin: 3.4 g/dL — ABNORMAL LOW (ref 3.5–5.2)
Alkaline Phosphatase: 92 U/L (ref 39–117)
CO2: 27 mEq/L (ref 19–32)
Calcium: 9.4 mg/dL (ref 8.4–10.5)
Creatinine, Ser: 0.64 mg/dL (ref 0.50–1.10)
GFR calc non Af Amer: 79 mL/min — ABNORMAL LOW (ref >90)
Total Bilirubin: 0.4 mg/dL (ref 0.3–1.2)

## 2011-06-29 LAB — URINALYSIS, ROUTINE W REFLEX MICROSCOPIC
Glucose, UA: NEGATIVE mg/dL
Nitrite: NEGATIVE
Protein, ur: 100 mg/dL — AB
Urobilinogen, UA: 1 mg/dL (ref 0.0–1.0)
pH: 7 (ref 5.0–8.0)

## 2011-06-29 LAB — POCT I-STAT, CHEM 8
BUN: 20 mg/dL (ref 6–23)
Calcium, Ion: 1.17 mmol/L (ref 1.12–1.32)
Chloride: 107 mEq/L (ref 96–112)
Glucose, Bld: 128 mg/dL — ABNORMAL HIGH (ref 70–99)
TCO2: 28 mmol/L (ref 0–100)

## 2011-06-29 LAB — ABO/RH: ABO/RH(D): O POS

## 2011-06-29 SURGERY — INSERTION, INTRAMEDULLARY ROD, FEMUR
Anesthesia: General | Site: Hip | Laterality: Left | Wound class: Clean

## 2011-06-29 MED ORDER — METHOCARBAMOL 100 MG/ML IJ SOLN
500.0000 mg | Freq: Four times a day (QID) | INTRAVENOUS | Status: DC | PRN
  Filled 2011-06-29: qty 5

## 2011-06-29 MED ORDER — ADULT MULTIVITAMIN W/MINERALS CH
1.0000 | ORAL_TABLET | Freq: Every day | ORAL | Status: DC
  Administered 2011-06-30 – 2011-07-08 (×9): 1 via ORAL
  Filled 2011-06-29 (×9): qty 1

## 2011-06-29 MED ORDER — PHENOL 1.4 % MT LIQD: 1.0000 | OROMUCOSAL | Status: DC | PRN

## 2011-06-29 MED ORDER — ONDANSETRON HCL 4 MG PO TABS: 4.0000 mg | ORAL_TABLET | Freq: Four times a day (QID) | ORAL | Status: DC | PRN

## 2011-06-29 MED ORDER — PANTOPRAZOLE SODIUM 40 MG PO TBEC
40.0000 mg | DELAYED_RELEASE_TABLET | Freq: Every day | ORAL | Status: DC
  Administered 2011-06-30 – 2011-07-07 (×8): 40 mg via ORAL
  Filled 2011-06-29 (×7): qty 1

## 2011-06-29 MED ORDER — CLONIDINE HCL 0.1 MG PO TABS
0.1000 mg | ORAL_TABLET | Freq: Four times a day (QID) | ORAL | Status: DC | PRN
  Filled 2011-06-29: qty 1

## 2011-06-29 MED ORDER — IOHEXOL 300 MG/ML  SOLN
100.0000 mL | Freq: Once | INTRAMUSCULAR | Status: AC | PRN
  Administered 2011-06-29: 100 mL via INTRAVENOUS

## 2011-06-29 MED ORDER — HYDROCODONE-ACETAMINOPHEN 5-325 MG PO TABS
1.0000 | ORAL_TABLET | ORAL | Status: DC | PRN
  Administered 2011-06-30 – 2011-07-01 (×3): 1 via ORAL
  Administered 2011-07-01: 2 via ORAL
  Administered 2011-07-02: 1 via ORAL
  Administered 2011-07-02 – 2011-07-03 (×2): 2 via ORAL
  Administered 2011-07-03 – 2011-07-08 (×13): 1 via ORAL
  Filled 2011-06-29 (×10): qty 1
  Filled 2011-06-29: qty 2
  Filled 2011-06-29 (×2): qty 1
  Filled 2011-06-29: qty 2
  Filled 2011-06-29 (×2): qty 1
  Filled 2011-06-29: qty 2
  Filled 2011-06-29 (×3): qty 1

## 2011-06-29 MED ORDER — DOCUSATE SODIUM 100 MG PO CAPS
100.0000 mg | ORAL_CAPSULE | Freq: Two times a day (BID) | ORAL | Status: DC
  Administered 2011-06-30 – 2011-07-06 (×14): 100 mg via ORAL
  Filled 2011-06-29 (×15): qty 1

## 2011-06-29 MED ORDER — CALCIUM CARBONATE 1250 (500 CA) MG PO TABS
1500.0000 mg | ORAL_TABLET | Freq: Two times a day (BID) | ORAL | Status: DC
  Administered 2011-06-30 – 2011-07-08 (×17): 1250 mg via ORAL
  Filled 2011-06-29 (×19): qty 1

## 2011-06-29 MED ORDER — ACETAMINOPHEN 10 MG/ML IV SOLN
INTRAVENOUS | Status: DC | PRN
  Administered 2011-06-29: 1000 mg via INTRAVENOUS

## 2011-06-29 MED ORDER — CEFAZOLIN SODIUM-DEXTROSE 2-3 GM-% IV SOLR
2.0000 g | Freq: Three times a day (TID) | INTRAVENOUS | Status: DC
  Administered 2011-06-30: 2 g via INTRAVENOUS
  Filled 2011-06-29 (×4): qty 50

## 2011-06-29 MED ORDER — HYDROMORPHONE HCL PF 1 MG/ML IJ SOLN: 0.2500 mg | INTRAMUSCULAR | Status: DC | PRN

## 2011-06-29 MED ORDER — SODIUM CHLORIDE 0.9 % IV SOLN
INTRAVENOUS | Status: DC
  Administered 2011-06-30: 02:00:00 via INTRAVENOUS

## 2011-06-29 MED ORDER — HYDROCODONE-ACETAMINOPHEN 5-325 MG PO TABS
1.0000 | ORAL_TABLET | ORAL | Status: DC | PRN
Start: 2011-06-29 — End: 2011-06-29

## 2011-06-29 MED ORDER — POTASSIUM CHLORIDE IN NACL 20-0.9 MEQ/L-% IV SOLN
INTRAVENOUS | Status: DC
  Filled 2011-06-29: qty 1000

## 2011-06-29 MED ORDER — LACTATED RINGERS IV SOLN
INTRAVENOUS | Status: DC | PRN
  Administered 2011-06-29 (×2): via INTRAVENOUS

## 2011-06-29 MED ORDER — FUROSEMIDE 40 MG PO TABS
40.0000 mg | ORAL_TABLET | Freq: Every day | ORAL | Status: DC
  Administered 2011-06-30 – 2011-07-03 (×4): 40 mg via ORAL
  Filled 2011-06-29 (×4): qty 1

## 2011-06-29 MED ORDER — FERROUS SULFATE 325 (65 FE) MG PO TABS
325.0000 mg | ORAL_TABLET | Freq: Two times a day (BID) | ORAL | Status: DC
  Administered 2011-06-30 – 2011-07-04 (×10): 325 mg via ORAL
  Filled 2011-06-29 (×11): qty 1

## 2011-06-29 MED ORDER — METOPROLOL TARTRATE 25 MG PO TABS
25.0000 mg | ORAL_TABLET | Freq: Two times a day (BID) | ORAL | Status: DC
  Administered 2011-06-30 (×2): 25 mg via ORAL
  Filled 2011-06-29 (×3): qty 1

## 2011-06-29 MED ORDER — METOPROLOL TARTRATE 1 MG/ML IV SOLN
5.0000 mg | INTRAVENOUS | Status: DC | PRN
  Filled 2011-06-29: qty 5

## 2011-06-29 MED ORDER — ACETAMINOPHEN 325 MG PO TABS
650.0000 mg | ORAL_TABLET | Freq: Four times a day (QID) | ORAL | Status: DC | PRN
  Administered 2011-06-30: 650 mg via ORAL
  Filled 2011-06-29 (×2): qty 2

## 2011-06-29 MED ORDER — METHOCARBAMOL 500 MG PO TABS
500.0000 mg | ORAL_TABLET | Freq: Four times a day (QID) | ORAL | Status: DC | PRN
  Administered 2011-07-04 – 2011-07-08 (×3): 500 mg via ORAL
  Filled 2011-06-29 (×4): qty 1

## 2011-06-29 MED ORDER — ENOXAPARIN SODIUM 30 MG/0.3ML ~~LOC~~ SOLN
30.0000 mg | Freq: Two times a day (BID) | SUBCUTANEOUS | Status: DC
  Administered 2011-06-30 – 2011-07-01 (×3): 30 mg via SUBCUTANEOUS
  Filled 2011-06-29 (×6): qty 0.3

## 2011-06-29 MED ORDER — ACETAMINOPHEN 650 MG RE SUPP: 650.0000 mg | Freq: Four times a day (QID) | RECTAL | Status: DC | PRN

## 2011-06-29 MED ORDER — MORPHINE SULFATE 2 MG/ML IJ SOLN
0.5000 mg | INTRAMUSCULAR | Status: DC | PRN
  Administered 2011-06-30 (×2): 0.5 mg via INTRAVENOUS
  Filled 2011-06-29 (×2): qty 1

## 2011-06-29 MED ORDER — PROPOFOL 10 MG/ML IV EMUL
INTRAVENOUS | Status: DC | PRN
  Administered 2011-06-29: 50 ug/kg/min via INTRAVENOUS
  Administered 2011-06-29: 25 ug/kg/min via INTRAVENOUS

## 2011-06-29 MED ORDER — CENTRUM PO LIQD: 5.0000 mL | Freq: Every day | ORAL | Status: DC

## 2011-06-29 MED ORDER — CEFAZOLIN SODIUM-DEXTROSE 2-3 GM-% IV SOLR: 2.0000 g | Freq: Once | INTRAVENOUS | Status: DC

## 2011-06-29 MED ORDER — PROPOFOL 10 MG/ML IV EMUL
INTRAVENOUS | Status: DC | PRN
  Administered 2011-06-29: 20 mg via INTRAVENOUS
  Administered 2011-06-29: 30 mg via INTRAVENOUS

## 2011-06-29 MED ORDER — FENTANYL CITRATE 0.05 MG/ML IJ SOLN
50.0000 ug | Freq: Once | INTRAMUSCULAR | Status: AC
  Administered 2011-06-29: 50 ug via INTRAVENOUS
  Filled 2011-06-29: qty 2

## 2011-06-29 MED ORDER — SODIUM CHLORIDE 0.9 % IV BOLUS (SEPSIS)
500.0000 mL | Freq: Once | INTRAVENOUS | Status: AC
  Administered 2011-06-29: 1000 mL via INTRAVENOUS

## 2011-06-29 MED ORDER — ONDANSETRON HCL 4 MG/2ML IJ SOLN: 4.0000 mg | Freq: Once | INTRAMUSCULAR | Status: DC | PRN

## 2011-06-29 MED ORDER — ONDANSETRON HCL 4 MG/2ML IJ SOLN: 4.0000 mg | Freq: Four times a day (QID) | INTRAMUSCULAR | Status: DC | PRN

## 2011-06-29 MED ORDER — SODIUM CHLORIDE 0.9 % IV SOLN: INTRAVENOUS | Status: DC

## 2011-06-29 MED ORDER — CEFAZOLIN SODIUM 1-5 GM-% IV SOLN
INTRAVENOUS | Status: DC | PRN
  Administered 2011-06-29: 2 g via INTRAVENOUS

## 2011-06-29 MED ORDER — DEXTROSE 5 % IV SOLN
INTRAVENOUS | Status: DC | PRN
  Administered 2011-06-29: 20:00:00 via INTRAVENOUS

## 2011-06-29 MED ORDER — SODIUM CHLORIDE 0.9 % IV SOLN
10.0000 mg | INTRAVENOUS | Status: DC | PRN
  Administered 2011-06-29: 10 ug via INTRAVENOUS

## 2011-06-29 MED ORDER — BISACODYL 10 MG RE SUPP
10.0000 mg | Freq: Every day | RECTAL | Status: DC | PRN
  Administered 2011-07-03 – 2011-07-06 (×2): 10 mg via RECTAL
  Filled 2011-06-29 (×3): qty 1

## 2011-06-29 MED ORDER — LOSARTAN POTASSIUM 25 MG PO TABS
25.0000 mg | ORAL_TABLET | Freq: Every day | ORAL | Status: DC
  Administered 2011-06-30 – 2011-07-03 (×5): 25 mg via ORAL
  Filled 2011-06-29 (×5): qty 1

## 2011-06-29 MED ORDER — MENTHOL 3 MG MT LOZG: 1.0000 | LOZENGE | OROMUCOSAL | Status: DC | PRN

## 2011-06-29 MED ORDER — METOCLOPRAMIDE HCL 5 MG/ML IJ SOLN
5.0000 mg | Freq: Three times a day (TID) | INTRAMUSCULAR | Status: DC | PRN
  Filled 2011-06-29: qty 2

## 2011-06-29 MED ORDER — MORPHINE SULFATE 2 MG/ML IJ SOLN
1.0000 mg | INTRAMUSCULAR | Status: DC | PRN
  Administered 2011-06-29: 1 mg via INTRAVENOUS
  Filled 2011-06-29: qty 1

## 2011-06-29 MED ORDER — ONDANSETRON HCL 4 MG/2ML IJ SOLN
INTRAMUSCULAR | Status: DC | PRN
  Administered 2011-06-29: 4 mg via INTRAVENOUS

## 2011-06-29 MED ORDER — FENTANYL CITRATE 0.05 MG/ML IJ SOLN
INTRAMUSCULAR | Status: DC | PRN
  Administered 2011-06-29: 50 ug via INTRAVENOUS
  Administered 2011-06-29: 25 ug via INTRAVENOUS

## 2011-06-29 MED ORDER — SODIUM CHLORIDE 0.9 % IJ SOLN
3.0000 mL | Freq: Two times a day (BID) | INTRAMUSCULAR | Status: DC
  Administered 2011-06-30 – 2011-07-08 (×15): 3 mL via INTRAVENOUS

## 2011-06-29 MED ORDER — LEVOTHYROXINE SODIUM 25 MCG PO TABS
25.0000 ug | ORAL_TABLET | Freq: Every day | ORAL | Status: DC
  Administered 2011-06-30 – 2011-07-08 (×8): 25 ug via ORAL
  Filled 2011-06-29 (×10): qty 1

## 2011-06-29 MED ORDER — ASPIRIN 81 MG PO CHEW
81.0000 mg | CHEWABLE_TABLET | Freq: Every day | ORAL | Status: DC
  Administered 2011-06-30 – 2011-07-08 (×9): 81 mg via ORAL
  Filled 2011-06-29 (×8): qty 1

## 2011-06-29 MED ORDER — CHLORHEXIDINE GLUCONATE 4 % EX LIQD
60.0000 mL | Freq: Once | CUTANEOUS | Status: DC
  Filled 2011-06-29: qty 60

## 2011-06-29 MED ORDER — GUAIFENESIN-DM 100-10 MG/5ML PO SYRP
5.0000 mL | ORAL_SOLUTION | ORAL | Status: DC | PRN
  Filled 2011-06-29: qty 5

## 2011-06-29 MED ORDER — METOCLOPRAMIDE HCL 5 MG PO TABS
5.0000 mg | ORAL_TABLET | Freq: Three times a day (TID) | ORAL | Status: DC | PRN
  Filled 2011-06-29: qty 2

## 2011-06-29 MED ORDER — TOLTERODINE TARTRATE 1 MG PO TABS
1.0000 mg | ORAL_TABLET | Freq: Two times a day (BID) | ORAL | Status: DC
  Administered 2011-06-30 – 2011-07-08 (×17): 1 mg via ORAL
  Filled 2011-06-29 (×19): qty 1

## 2011-06-29 MED ORDER — ALBUTEROL SULFATE (5 MG/ML) 0.5% IN NEBU: 2.5000 mg | INHALATION_SOLUTION | RESPIRATORY_TRACT | Status: DC | PRN

## 2011-06-29 SURGICAL SUPPLY — 51 items
BANDAGE ELASTIC 4 VELCRO ST LF (GAUZE/BANDAGES/DRESSINGS) ×2 IMPLANT
BANDAGE ELASTIC 6 VELCRO ST LF (GAUZE/BANDAGES/DRESSINGS) ×2 IMPLANT
BANDAGE GAUZE ELAST BULKY 4 IN (GAUZE/BANDAGES/DRESSINGS) ×1 IMPLANT
BIT DRILL 4.3MMS DISTAL GRDTED (BIT) IMPLANT
CLOTH BEACON ORANGE TIMEOUT ST (SAFETY) ×2 IMPLANT
COVER MAYO STAND STRL (DRAPES) ×2 IMPLANT
COVER SURGICAL LIGHT HANDLE (MISCELLANEOUS) ×2 IMPLANT
DRAPE C-ARM 42X72 X-RAY (DRAPES) IMPLANT
DRAPE INCISE IOBAN 66X45 STRL (DRAPES) IMPLANT
DRAPE ORTHO SPLIT 77X108 STRL (DRAPES) ×4
DRAPE PROXIMA HALF (DRAPES) ×4 IMPLANT
DRAPE STERI IOBAN 125X83 (DRAPES) IMPLANT
DRAPE SURG ORHT 6 SPLT 77X108 (DRAPES) ×2 IMPLANT
DRAPE U-SHAPE 47X51 STRL (DRAPES) ×2 IMPLANT
DRILL 4.3MMS DISTAL GRADUATED (BIT) ×2
DRSG ADAPTIC 3X8 NADH LF (GAUZE/BANDAGES/DRESSINGS) ×2 IMPLANT
DRSG MEPILEX BORDER 4X4 (GAUZE/BANDAGES/DRESSINGS) ×4 IMPLANT
DRSG MEPILEX BORDER 4X8 (GAUZE/BANDAGES/DRESSINGS) ×2 IMPLANT
DURAPREP 26ML APPLICATOR (WOUND CARE) ×1 IMPLANT
ELECT REM PT RETURN 9FT ADLT (ELECTROSURGICAL) ×2
ELECTRODE REM PT RTRN 9FT ADLT (ELECTROSURGICAL) ×1 IMPLANT
FACESHIELD LNG OPTICON STERILE (SAFETY) ×4 IMPLANT
GLOVE BIO SURGEON STRL SZ7.5 (GLOVE) ×3 IMPLANT
GLOVE BIO SURGEON STRL SZ8.5 (GLOVE) ×1 IMPLANT
GLOVE BIOGEL PI ORTHO PRO 7.5 (GLOVE) ×1
GLOVE BIOGEL PI ORTHO PRO SZ8 (GLOVE) ×1
GLOVE ORTHO TXT STRL SZ7.5 (GLOVE) ×2 IMPLANT
GLOVE PI ORTHO PRO STRL 7.5 (GLOVE) ×1 IMPLANT
GLOVE PI ORTHO PRO STRL SZ8 (GLOVE) ×1 IMPLANT
GLOVE SURG ORTHO 8.5 STRL (GLOVE) ×2 IMPLANT
GOWN STRL NON-REIN LRG LVL3 (GOWN DISPOSABLE) ×6 IMPLANT
GOWN STRL REIN XL XLG (GOWN DISPOSABLE) ×4 IMPLANT
GUIDEPIN 3.2X17.5 THRD DISP (PIN) ×1 IMPLANT
GUIDEWIRE BALL NOSE 100CM (WIRE) ×1 IMPLANT
HFN LH 130 DEG 11MM X 340MM (Nail) ×1 IMPLANT
KIT BASIN OR (CUSTOM PROCEDURE TRAY) ×2 IMPLANT
KIT ROOM TURNOVER OR (KITS) ×2 IMPLANT
MANIFOLD NEPTUNE II (INSTRUMENTS) ×2 IMPLANT
NS IRRIG 1000ML POUR BTL (IV SOLUTION) ×2 IMPLANT
PACK GENERAL/GYN (CUSTOM PROCEDURE TRAY) ×2 IMPLANT
PAD ARMBOARD 7.5X6 YLW CONV (MISCELLANEOUS) ×4 IMPLANT
PAD CAST 4YDX4 CTTN HI CHSV (CAST SUPPLIES) ×1 IMPLANT
PADDING CAST COTTON 4X4 STRL (CAST SUPPLIES) ×2
SCREW BONE CORTICAL 5.0X38 (Screw) ×1 IMPLANT
SCREW LAG HIP NAIL 10.5X95 (Screw) ×1 IMPLANT
STAPLER VISISTAT 35W (STAPLE) ×1 IMPLANT
SUT ETHILON 4 0 PS 2 18 (SUTURE) IMPLANT
SUT VIC AB 0 CTB1 27 (SUTURE) ×1 IMPLANT
SUT VIC AB 2-0 CT1 27 (SUTURE) ×2
SUT VIC AB 2-0 CT1 TAPERPNT 27 (SUTURE) IMPLANT
WATER STERILE IRR 1000ML POUR (IV SOLUTION) ×2 IMPLANT
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
## 2011-06-29 NOTE — ED Notes (Signed)
Patient transported to CT 
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
## 2011-06-29 NOTE — Transfer of Care (Signed)
Immediate Anesthesia Transfer of Care Note  Patient: Jessica Hawkins  Procedure(s) Performed: Procedure(s) (LRB): INTRAMEDULLARY (IM) NAIL FEMORAL (Left)  Patient Location: PACU  Anesthesia Type: Spinal  Level of Consciousness: awake, alert  and oriented  Airway & Oxygen Therapy: Patient Spontanous Breathing and Patient connected to nasal cannula oxygen  Post-op Assessment: Report given to PACU RN and Post -op Vital signs reviewed and stable  Post vital signs: Reviewed and stable  Complications: No apparent anesthesia complications

## 2011-06-29 NOTE — ED Notes (Signed)
This RN reported to [employer] on 4700 that pt is scheduled for surgery w/Dr. Ranell Patrick as long as her CT scan of spine and chest is clear and will come to 4700 after surgery. If results are abnormal pt may or may not come to 4700, please advise receiving RN of recent update
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
## 2011-06-29 NOTE — ED Notes (Signed)
Spoke with Neal Dy and daughter [phone].
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
## 2011-06-29 NOTE — Anesthesia Procedure Notes (Addendum)
Anesthesia Regional Block:   Narrative:    Anesthesia Regional Block:   Narrative:    Spinal  Patient location during procedure: OR Start time: 06/29/2011 8:10 PM End time: 06/29/2011 8:20 PM Staffing Performed by: anesthesiologist  Preanesthetic Checklist Completed: patient identified, site marked, surgical consent, pre-op evaluation, timeout performed, IV checked, risks and benefits discussed and monitors and equipment checked Spinal Block Patient position: right lateral decubitus Prep: ChloraPrep Patient monitoring: heart rate, cardiac monitor, continuous pulse ox and blood pressure Approach: right paramedian Location: L3-4 Injection technique: single-shot Needle Needle type: Tuohy  Needle gauge: 22 G Needle length: 9 cm Needle insertion depth: 5 cm Assessment Sensory level: T12 Additional Notes R. Paramedian approach. Clear CSF, 7 mg 0.75% bupivicaine injected without difficulty.  Kipp Brood, MD
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
## 2011-06-29 NOTE — ED Notes (Signed)
Family updated as to patient's status. Spoke with daughter Jessica Hawkins from [place], Health care POA, she has been updated on pt's condition per patients request. Contact numbers [phone] if not available [phone]. Please call with any new information.
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
## 2011-06-29 NOTE — ED Notes (Signed)
Pt transported to OR perDr. Ranell Patrick
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
## 2011-06-29 NOTE — Preoperative (Signed)
Beta Blockers   Reason not to administer Beta Blockers:Not Applicable 

## 2011-06-30 ENCOUNTER — Encounter (HOSPITAL_COMMUNITY)
Admission: EM | Disposition: A | Discharge status: Skilled Nursing Facility | Payer: Self-pay | Source: Home / Self Care | Attending: Family Medicine

## 2011-06-30 ENCOUNTER — Encounter (HOSPITAL_COMMUNITY): Payer: Self-pay | Admitting: *Deleted

## 2011-06-30 DIAGNOSIS — M542 Cervicalgia: Secondary | ICD-10-CM | POA: Diagnosis not present

## 2011-06-30 DIAGNOSIS — K219 Gastro-esophageal reflux disease without esophagitis: Secondary | ICD-10-CM | POA: Diagnosis not present

## 2011-06-30 DIAGNOSIS — M069 Rheumatoid arthritis, unspecified: Secondary | ICD-10-CM | POA: Diagnosis not present

## 2011-06-30 DIAGNOSIS — I10 Essential (primary) hypertension: Secondary | ICD-10-CM | POA: Diagnosis not present

## 2011-06-30 DIAGNOSIS — S72009A Fracture of unspecified part of neck of unspecified femur, initial encounter for closed fracture: Secondary | ICD-10-CM | POA: Diagnosis not present

## 2011-06-30 LAB — CBC
HCT: 29.8 % — ABNORMAL LOW (ref 36.0–46.0)
MCV: 83 fL (ref 78.0–100.0)
RDW: 15 % (ref 11.5–15.5)
WBC: 11.1 10*3/uL — ABNORMAL HIGH (ref 4.0–10.5)

## 2011-06-30 LAB — PROTIME-INR
INR: 1.22 (ref 0.00–1.49)
Prothrombin Time: 15.7 seconds — ABNORMAL HIGH (ref 11.6–15.2)

## 2011-06-30 LAB — HEMOGLOBIN AND HEMATOCRIT, BLOOD
HCT: 22.8 % — ABNORMAL LOW (ref 36.0–46.0)
Hemoglobin: 7.6 g/dL — ABNORMAL LOW (ref 12.0–15.0)

## 2011-06-30 SURGERY — INSERTION, INTRAMEDULLARY ROD, FEMUR
Anesthesia: General | Laterality: Left

## 2011-06-30 MED ORDER — CEFAZOLIN SODIUM-DEXTROSE 2-3 GM-% IV SOLR
2.0000 g | Freq: Three times a day (TID) | INTRAVENOUS | Status: DC
  Administered 2011-06-30 – 2011-07-03 (×10): 2 g via INTRAVENOUS
  Filled 2011-06-30 (×13): qty 50

## 2011-06-30 MED ORDER — ACETAMINOPHEN 325 MG PO TABS
650.0000 mg | ORAL_TABLET | Freq: Once | ORAL | Status: AC
  Administered 2011-06-30: 650 mg via ORAL

## 2011-06-30 MED ORDER — METOPROLOL TARTRATE 12.5 MG HALF TABLET
12.5000 mg | ORAL_TABLET | Freq: Two times a day (BID) | ORAL | Status: DC
  Administered 2011-06-30 – 2011-07-08 (×16): 12.5 mg via ORAL
  Filled 2011-06-30 (×17): qty 1

## 2011-06-30 MED ORDER — FUROSEMIDE 10 MG/ML IJ SOLN
20.0000 mg | Freq: Once | INTRAMUSCULAR | Status: AC
  Administered 2011-06-30: 20 mg via INTRAVENOUS
  Filled 2011-06-30: qty 2

## 2011-06-30 MED ORDER — DIPHENHYDRAMINE HCL 25 MG PO CAPS
25.0000 mg | ORAL_CAPSULE | Freq: Once | ORAL | Status: AC
  Administered 2011-06-30: 25 mg via ORAL
  Filled 2011-06-30: qty 1
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
## 2011-06-30 NOTE — Progress Notes (Signed)
NOTIFIED [name] [facility] OF PATIENT'S C-COLLAR NOT FITTING APPROPRIATELY.  PATIENT'S CHIN CONTINUED TO SLIP DOWN INTO COLLAR-HAVING HER NOSE REST ON THE TOP OF IT.  THIS WAS CAUSING PATIENT SOME DISCOMFORT.  PER [name], PATIENT HAD NO ORDER AND CT SCAN SHOWED NO ACUTE FRACTURES SO TAKE COLLAR OFF.  WILL REMOVE COLLAR AND CONTINUE TO MONITOR PATIENT.   Jessica Hawkins

## 2011-06-30 NOTE — Progress Notes (Signed)
Orthopedic Tech Progress Note Patient Details:  Jessica Hawkins 1922/07/28 [id number]  Patient ID: Jessica Hawkins, female   DOB: 1922/05/01, 78 y.o.   MRN: [id number] Contacted [employer] for brace order.  Jessica Hawkins 06/30/2011, 11:35 AM

## 2011-06-30 NOTE — Anesthesia Postprocedure Evaluation (Signed)
  Anesthesia Post-op Note  Patient: Jessica Hawkins  Procedure(s) Performed: Procedure(s) (LRB): INTRAMEDULLARY (IM) NAIL FEMORAL (Left)  Patient Location: PACU  Anesthesia Type: Spinal  Level of Consciousness: awake, alert  and oriented  Airway and Oxygen Therapy: Patient Spontanous Breathing and Patient connected to nasal cannula oxygen  Post-op Pain: none  Post-op Assessment: Post-op Vital signs reviewed and Patient's Cardiovascular Status Stable  Post-op Vital Signs: stable  Complications: No apparent anesthesia complications
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
## 2011-06-30 NOTE — Progress Notes (Signed)
PATIENT ARRIVED TO UNIT FROM PACU VIA BED.  PATIENT ALERT & ORIENTED. PATIENT ORIENTED TO UNIT, CALL BUTTON WITHIN REACH. REVIEWED FALL PRECAUTIONS AND SAFETY PLAN.  WILL CONTINUE TO MONITOR.  Jessica Hawkins
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
## 2011-06-30 NOTE — Progress Notes (Addendum)
Clinical Social Work Department CLINICAL SOCIAL WORK PLACEMENT NOTE 06/30/2011  Patient:  Jessica Hawkins, Jessica Hawkins  Account Number:  [id number] Admit date:  06/29/2011  Clinical Social Worker:  Noel Journey, CLINICAL SOCIAL WORKER  Date/time:  06/30/2011 11:00 AM  Clinical Social Work is seeking post-discharge placement for this patient at the following level of care:   SKILLED NURSING   (*CSW will update this form in Epic as items are completed)   06/30/2011  Patient/family provided with [facility] Department of Clinical Social Work's list of facilities offering this level of care within the geographic area requested by the patient (or if unable, by the patient's family).  06/30/2011  Patient/family informed of their freedom to choose among providers that offer the needed level of care, that participate in Medicare, Medicaid or managed care program needed by the patient, have an available bed and are willing to accept the patient.  06/30/2011  Patient/family informed of [facility]' ownership interest in [facility], as well as of the fact that they are under no obligation to receive care at this facility.  PASARR submitted to EDS on 06/30/2011 PASARR number received from EDS on 07/02/2011  FL2 transmitted to all facilities in geographic area requested by pt/family on  06/30/2011 FL2 transmitted to all facilities within larger geographic area on   Patient informed that his/her managed care company has contracts with or will negotiate with  certain facilities, including the following:     Patient/family informed of bed offers received:07/01/2011   Patient chooses bed at [facility] Physician recommends and patient chooses bed at    Patient to be transferred to  on  07/08/11 [facility] Patient to be transferred to facility by - non emergency ambulance-[facility]  The following physician request were entered in Epic:   Additional Comments: Theresia Bough, MSW, [employer] [phone]

## 2011-07-01 ENCOUNTER — Encounter (HOSPITAL_COMMUNITY): Payer: Self-pay | Admitting: Orthopedic Surgery

## 2011-07-01 DIAGNOSIS — S72009A Fracture of unspecified part of neck of unspecified femur, initial encounter for closed fracture: Secondary | ICD-10-CM | POA: Diagnosis not present

## 2011-07-01 DIAGNOSIS — K219 Gastro-esophageal reflux disease without esophagitis: Secondary | ICD-10-CM | POA: Diagnosis not present

## 2011-07-01 DIAGNOSIS — I10 Essential (primary) hypertension: Secondary | ICD-10-CM | POA: Diagnosis not present

## 2011-07-01 DIAGNOSIS — M069 Rheumatoid arthritis, unspecified: Secondary | ICD-10-CM | POA: Diagnosis not present

## 2011-07-01 LAB — HEMOGLOBIN AND HEMATOCRIT, BLOOD: Hemoglobin: 7.7 g/dL — ABNORMAL LOW (ref 12.0–15.0)

## 2011-07-01 LAB — BASIC METABOLIC PANEL
BUN: 22 mg/dL (ref 6–23)
Calcium: 8.2 mg/dL — ABNORMAL LOW (ref 8.4–10.5)
Creatinine, Ser: 1.01 mg/dL (ref 0.50–1.10)
GFR calc non Af Amer: 49 mL/min — ABNORMAL LOW (ref >90)
Glucose, Bld: 130 mg/dL — ABNORMAL HIGH (ref 70–99)
Potassium: 4.1 mEq/L (ref 3.5–5.1)

## 2011-07-01 LAB — CBC
HCT: 25.6 % — ABNORMAL LOW (ref 36.0–46.0)
Hemoglobin: 8.4 g/dL — ABNORMAL LOW (ref 12.0–15.0)
MCH: 27.6 pg (ref 26.0–34.0)
MCHC: 32.8 g/dL (ref 30.0–36.0)
MCV: 84.2 fL (ref 78.0–100.0)

## 2011-07-01 LAB — PREPARE RBC (CROSSMATCH)
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
## 2011-07-01 NOTE — Progress Notes (Signed)
Orthopedics Progress Note  Subjective: I feel better today.  The left hip still hurts.  Objective:  Filed Vitals:   07/01/11 0642  BP: 129/52  Pulse: 95  Temp: 99.3 F (37.4 C)  Resp: 18    General: Awake and alert  Musculoskeletal: Left LE:  Thigh dressings CDI, mod swelling, no cords Neurovascularly intact  Lab Results  Component Value Date   WBC 9.1 07/01/2011   HGB 8.4* 07/01/2011   HCT 25.6* 07/01/2011   MCV 84.2 07/01/2011   PLT 78* 07/01/2011       Component Value Date/Time   NA 142 06/29/2011 1437   K 4.0 06/29/2011 1437   CL 107 06/29/2011 1437   CO2 27 06/29/2011 1425   GLUCOSE 128* 06/29/2011 1437   BUN 20 06/29/2011 1437   CREATININE 0.70 06/29/2011 1437   CALCIUM 9.4 06/29/2011 1425   GFRNONAA 79* 06/29/2011 1425   GFRAA >90 06/29/2011 1425    Lab Results  Component Value Date   INR 1.22 06/30/2011    Assessment/Plan: POD #2s/p Procedure(s): INTRAMEDULLARY (IM) NAIL FEMORAL Stable from ortho standpoint. Will need SNF rehab PT,OT,OOB DVT prophylaxis , mechanical and Lovenox  F/U with Dr Ranell Patrick in 2 weeks [phone]  NWB on left LE  Almedia Balls. Ranell Patrick, MD 07/01/2011 7:55 AM
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
## 2011-07-01 NOTE — Progress Notes (Signed)
Physical Therapy Treatment Patient Details Name: LENNA HAGARTY MRN: [id number] DOB: March 02, 1923 Today's Date: 07/01/2011 Time: 3235-5732 PT Time Calculation (min): 42 min  PT Assessment / Plan / Recommendation Comments on Treatment Session       Follow Up Recommendations  Skilled nursing facility    Equipment Recommendations  Defer to next venue    Frequency Min 5X/week   Plan Discharge plan remains appropriate;Frequency remains appropriate    Precautions / Restrictions Precautions Precautions: Fall Required Braces or Orthoses: Cervical Brace (for comfort only per Dr. Danielle Dess) Restrictions Weight Bearing Restrictions: Yes LLE Weight Bearing: Non weight bearing   Pertinent Vitals/Pain Pt reports pain 4/10 RN to give pain meds.     Mobility  Bed Mobility Bed Mobility: Supine to Sit;Sitting - Scoot to Edge of Bed Supine to Sit: 2: Max assist;HOB flat;HOB elevated;With rails (pt 30%) Sitting - Scoot to Edge of Bed: 2: Max assist Details for Bed Mobility Assistance: Assist to manage LLE cues for sequencing assist to move shoulders to left.  assist to raise shoulders from bed and manage LLE to minimize pain.   Transfers Transfers: Pharmacologist;Sit to Stand;Stand to Sit Sit to Stand: 1: +1 Total assist;With upper extremity assist;From bed Stand to Sit: With upper extremity assist;To chair/3-in-1;2: Max assist Stand Pivot Transfers: 1: +2 Total assist Stand Pivot Transfers: Patient Percentage: 20% Lateral/Scoot Transfers: Not tested (comment) Details for Transfer Assistance: Pt able to maintain NWB in L LE well.  Pt total assist to stand. Verbal cues for hand placement and technique.  Assist to maintain standing secondary to posterior lean. Manual facilitation to initiate pivot on Rt LE and negotiate walker. Mod assist for controlled descent intor chair, pt reaching back for Left armrest without cueing.   Ambulation/Gait Ambulation/Gait Assistance: Not tested (comment)    Exercises General Exercises - Lower Extremity Ankle Circles/Pumps: AROM;Both;10 reps Quad Sets: Both;5 reps Gluteal Sets: Both;5 reps Heel Slides: AAROM;5 reps;Both;Supine (limited ROM in Lt LE secondary to pain. ) Hip ABduction/ADduction: Both;5 reps;AAROM Straight Leg Raises: 5 reps;Left;AAROM;Supine   PT Goals Acute Rehab PT Goals PT Goal Formulation: With patient Time For Goal Achievement: 07/14/11 Potential to Achieve Goals: Good Pt will go Supine/Side to Sit: with min assist PT Goal: Supine/Side to Sit - Progress: Progressing toward goal Pt will go Sit to Supine/Side: with min assist PT Goal: Sit to Supine/Side - Progress: Progressing toward goal Pt will go Sit to Stand: with min assist PT Goal: Sit to Stand - Progress: Progressing toward goal Pt will go Stand to Sit: with min assist PT Goal: Stand to Sit - Progress: Progressing toward goal Pt will Transfer Bed to Chair/Chair to Bed: with min assist PT Transfer Goal: Bed to Chair/Chair to Bed - Progress: Progressing toward goal  Visit Information  Last PT Received On: 07/01/11 Assistance Needed: +2    Subjective Data  Subjective: I am feeling better today.  Patient Stated Goal: be able to stand up    Cognition  Overall Cognitive Status: Appears within functional limits for tasks assessed/performed Arousal/Alertness: Awake/alert Orientation Level: Oriented X4 / Intact Behavior During Session: [facility] for tasks performed    Balance  Balance Balance Assessed: Yes Static Sitting Balance Static Sitting - Balance Support: Bilateral upper extremity supported;Feet supported Static Sitting - Level of Assistance: 4: Min assist Static Sitting - Comment/# of Minutes: 5+ minutes sitting of EOB with min assist secondary to posteiror trunk lean and Rt lateral trunk lean (pt avoids weighbearing on Left hip) Provided pt  with tactile cues through L hip to increase wt on Lt buttock to centralize pt in sitting.  Pt's hips sliding forward  with attempt to have pt flex at hips via tactile cues.   End of Session PT - End of Session Equipment Utilized During Treatment: Gait belt;Cervical collar Activity Tolerance: Patient tolerated treatment well Patient left: in chair;with call bell/phone within reach;with nursing in room;with family/visitor present Nurse Communication: Mobility status    Kamel Haven 07/01/2011, 2:21 PM Dazani Norby L. Sydell Prowell DPT [phone]

## 2011-07-01 NOTE — Clinical Documentation Improvement (Signed)
Abnormal CT Report Clarification  THIS DOCUMENT IS NOT A PERMANENT PART OF THE MEDICAL RECORD   Please update your documentation within the medical record to reflect your response to this query.                                                                                    07/01/11  Dr. Thedore Mins and/or Associates,  Abnormal radiology findings are not coded and reported unless the physician indicates their clinical significance.     The medical record reflects the following clinical findings:     06/29/11   CT Chest Acute sternal fracture.  Probable injury to the right costochondral junction to the right of the sternal fracture.  No evidence of aortic injury. Lack of intravenous contrast limits sensitivity for aortic injury.  Original Report Authenticated By: Donavan Burnet, M.D.   Please clarify and document the clinical significance of the abnormal CT results in the progress notes and discharge summary,    In responding to this query please exercise your independent judgment.  The fact that a query is asked, does not imply that any particular answer is desired or expected.   Reviewed: "chest contusion, acute sternal fracture" documented by dr. Thedore Mins progress note 07/02/11.  Mathis Dad RN   Thank You,  Jerral Ralph  RN BSN Certified Clinical Documentation Specialist: Cell   [phone]  Health Information Management [place]  TO RESPOND TO THE THIS QUERY, FOLLOW THE INSTRUCTIONS BELOW:  1. If needed, update documentation for the patient's encounter via the notes activity.  2. Access this query again and click edit on the [employer].  3. After updating, or not, click F2 to complete all highlighted (required) fields concerning your review. Select "additional documentation in the medical record" OR "no additional documentation provided".  4. Click Sign note button.  5. The deficiency will fall out of your InBasket *Please let us know if you are not able  to complete this workflow by phone or e-mail (listed below).
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
## 2011-07-01 NOTE — Progress Notes (Signed)
Physical Therapy Treatment Patient Details Name: Jessica Hawkins MRN: [id number] DOB: 04-Oct-1922 Today's Date: 07/01/2011 Time: 9528-4132 PT Time Calculation (min): 25 min  PT Assessment / Plan / Recommendation Comments on Treatment Session       Follow Up Recommendations  Skilled nursing facility    Equipment Recommendations  Defer to next venue    Frequency Min 5X/week   Plan Discharge plan remains appropriate;Frequency remains appropriate    Precautions / Restrictions Precautions Required Braces or Orthoses: Cervical Brace Restrictions Weight Bearing Restrictions: Yes LLE Weight Bearing: Non weight bearing    Pertinent Vitals/Pain Pt reports pain 4/10 in hip with activity 2/10 at rest.      Mobility  Bed Mobility Bed Mobility: Sit to Supine Supine to Sit: Not tested (comment) Sitting - Scoot to Edge of Bed: Not tested (comment) Sit to Supine: HOB flat;1: +2 Total assist Sit to Supine: Patient Percentage: 20% Details for Bed Mobility Assistance: Cues for techniques total assist to manage bilateral LEs and trunk.   Transfers Transfers: Lateral/Scoot Transfers Sit to Stand: Not tested (comment) Stand to Sit: Not tested (comment) Stand Pivot Transfers: Not tested (comment) Lateral/Scoot Transfers: 1: +1 Total assist Details for Transfer Assistance: Max cueing for safe technique and encouragement. Total assist to initate scooting to bed. pt able to maintain NWB L LE.      Exercises     PT Goals Acute Rehab PT Goals PT Goal Formulation: With patient Time For Goal Achievement: 07/14/11 Potential to Achieve Goals: Good Pt will go Supine/Side to Sit: with min assist Pt will go Sit to Supine/Side: with min assist PT Goal: Sit to Supine/Side - Progress: Progressing toward goal Pt will Transfer Bed to Chair/Chair to Bed: with min assist PT Transfer Goal: Bed to Chair/Chair to Bed - Progress: Progressing toward goal  Visit Information  Last PT Received On: 07/01/11   Subjective Data      Cognition  Overall Cognitive Status: Appears within functional limits for tasks assessed/performed Arousal/Alertness: Awake/alert Orientation Level: Oriented X4 / Intact Behavior During Session: [facility] for tasks performed    Balance  Balance Balance Assessed: No  End of Session PT - End of Session Equipment Utilized During Treatment: Gait belt;Cervical collar Activity Tolerance: Patient tolerated treatment well Patient left: in bed;with call bell/phone within reach;with family/visitor present;with bed alarm set Nurse Communication: Mobility status    Zakariya Knickerbocker 07/01/2011, 7:13 PM Alajiah Dutkiewicz L. Annmarie Plemmons DPT [phone]

## 2011-07-01 NOTE — Progress Notes (Signed)
Utilization review completed.  

## 2011-07-02 DIAGNOSIS — K219 Gastro-esophageal reflux disease without esophagitis: Secondary | ICD-10-CM | POA: Diagnosis not present

## 2011-07-02 DIAGNOSIS — I10 Essential (primary) hypertension: Secondary | ICD-10-CM | POA: Diagnosis not present

## 2011-07-02 DIAGNOSIS — S72009A Fracture of unspecified part of neck of unspecified femur, initial encounter for closed fracture: Secondary | ICD-10-CM | POA: Diagnosis not present

## 2011-07-02 DIAGNOSIS — M069 Rheumatoid arthritis, unspecified: Secondary | ICD-10-CM | POA: Diagnosis not present

## 2011-07-02 LAB — HEMOGLOBIN AND HEMATOCRIT, BLOOD
HCT: 23.7 % — ABNORMAL LOW (ref 36.0–46.0)
HCT: 24.9 % — ABNORMAL LOW (ref 36.0–46.0)

## 2011-07-02 LAB — PREPARE RBC (CROSSMATCH)

## 2011-07-02 MED ORDER — FUROSEMIDE 10 MG/ML IJ SOLN
10.0000 mg | INTRAMUSCULAR | Status: AC
  Administered 2011-07-02 (×2): 10 mg via INTRAVENOUS
  Filled 2011-07-02 (×2): qty 1
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
## 2011-07-02 NOTE — Progress Notes (Signed)
Pt sates no BM since she adm.  Message sent to Dr.Singh for meds for BM>  Harrie Foreman, RN.

## 2011-07-02 NOTE — Progress Notes (Signed)
Clinical Social Work:  Following for patient who is a new nursing home placement: with bed at [facility].  Spoke with MD and CM and patient is not ready for discharge today, however per MD maybe tomorrow.  Spoke with Jasmine December at [facility] to notify facility of delay of dc today and reports that is not a problem and will accept when ready.   Will continue to follow and assist with discharge planning.  Ashley Jacobs, MSW LCSW [phone]

## 2011-07-02 NOTE — Progress Notes (Signed)
Dr. Gilman Buttner return call. Instructed nurse to  initiate prn laxative order first.  Jessica Hawkins.

## 2011-07-02 NOTE — Progress Notes (Signed)
Orthopedics Progress Note  Subjective: Pt resting comfortably c/o mild pain to left hip this morning, denies any other new symptoms  Objective:  Filed Vitals:   07/02/11 0439  BP: 127/50  Pulse: 72  Temp: 97.8 F (36.6 C)  Resp: 18    General: Awake and alert  Musculoskeletal: left hip incisions healing well, nv intact distally Neurovascularly intact  Lab Results  Component Value Date   WBC 9.1 07/01/2011   HGB 7.9* 07/02/2011   HCT 23.7* 07/02/2011   MCV 84.2 07/01/2011   PLT 78* 07/01/2011       Component Value Date/Time   NA 138 07/01/2011 0525   K 4.1 07/01/2011 0525   CL 104 07/01/2011 0525   CO2 26 07/01/2011 0525   GLUCOSE 130* 07/01/2011 0525   BUN 22 07/01/2011 0525   CREATININE 1.01 07/01/2011 0525   CALCIUM 8.2* 07/01/2011 0525   GFRNONAA 49* 07/01/2011 0525   GFRAA 57* 07/01/2011 0525    Lab Results  Component Value Date   INR 1.22 06/30/2011    Assessment/Plan: POD #3 s/p Procedure(s):left  INTRAMEDULLARY (IM) NAIL FEMORAL  Non weight bearing left lower extremity D/c planning per medical team Stable from orthopedic standpoint  Viviann Spare R. Ranell Patrick, MD 07/02/2011 7:18 AM

## 2011-07-02 NOTE — Progress Notes (Signed)
NP notified at beginning of shift of Hgb 7.7 and Hct 23.1. Orders received to transfuse 1 unit pRBCs. Unit transfused, ending at midnight. NP later paged about pt's new Hgb 7.9 and Hct 23.7. No orders received at this time. Will continue to monitor.

## 2011-07-02 NOTE — Progress Notes (Signed)
Occupational Therapy Treatment Patient Details Name: DORCAS MELITO MRN: [id number] DOB: [birth year] Today's Date: 07/02/2011 Time: 4098-1191 OT Time Calculation (min): 37 min  OT Assessment / Plan / Recommendation Comments on Treatment Session Pt. agreeable for OOB activity today and progressing well even with decreased hgb.     Follow Up Recommendations  Skilled nursing facility    Equipment Recommendations  Defer to next venue    Frequency Min 1X/week   Plan Discharge plan remains appropriate    Precautions / Restrictions Precautions Precautions: Fall Required Braces or Orthoses: Cervical Brace (For comfort per Dr Danielle Dess.  ) Cervical Brace: Hard collar Restrictions Weight Bearing Restrictions: Yes LLE Weight Bearing: Non weight bearing   Pertinent Vitals/Pain 8/10 left LE    ADL  Grooming: Performed;Wash/dry face;Set up Where Assessed - Grooming: Supine, head of bed up Upper Body Dressing: Performed;Minimal assistance Where Assessed - Upper Body Dressing: Unsupported;Sitting, bed Toilet Transfer: Simulated;+2 Total assistance;Comment for patient % (pt=40%) Toilet Transfer Method: Squat pivot Toilet Transfer Equipment: Drop arm bedside commode    OT Goals Acute Rehab OT Goals OT Goal Formulation: With patient Time For Goal Achievement: 07/15/11 Potential to Achieve Goals: Good ADL Goals Pt Will Perform Upper Body Dressing: Sitting, chair;with set-up ADL Goal: Upper Body Dressing - Progress: Progressing toward goals Pt Will Transfer to Toilet: with mod assist;Drop arm 3-in-1;Maintaining weight bearing status;with cueing (comment type and amount) ADL Goal: Toilet Transfer - Progress: Progressing toward goals  Visit Information  Last OT Received On: 07/02/11 Assistance Needed: +2 PT/OT Co-Evaluation/Treatment: Yes          Cognition  Overall Cognitive Status: Appears within functional limits for tasks assessed/performed Arousal/Alertness:  Awake/alert Orientation Level: Oriented X4 / Intact Behavior During Session: [facility] for tasks performed    Mobility Bed Mobility Bed Mobility: Supine to Sit Supine to Sit: 3: Mod assist;With rails Sitting - Scoot to Edge of Bed: 2: Max assist Details for Bed Mobility Assistance: cues for sequencing, safe technique, A with L LE and trunk.   Transfers Transfers: Sit to Stand Sit to Stand: 1: +1 Total assist;With upper extremity assist;From bed Details for Transfer Assistance: max verbal cues for hand placement and technique for maintaining NWB L LE      Balance Balance Balance Assessed: No  End of Session OT - End of Session Equipment Utilized During Treatment: Gait belt Activity Tolerance: Patient tolerated treatment well Patient left: in chair;with call bell/phone within reach Nurse Communication: Mobility status   Marg Macmaster, OTR/L Pager [phone] 07/02/2011, 2:50 PM

## 2011-07-02 NOTE — Progress Notes (Signed)
Physical Therapy Note   07/02/11 1320  PT Visit Information  Last PT Received On 07/02/11  Assistance Needed +2  PT/OT Co-Evaluation/Treatment Yes  PT Time Calculation  PT Start Time 1315  PT Stop Time 1340  PT Time Calculation (min) 25 min  Subjective Data  Subjective IT would feel good to get up.    Precautions  Precautions Fall  Required Braces or Orthoses Cervical Brace (For comfort per Dr Danielle Dess.  )  Cervical Brace Hard collar  Restrictions  Weight Bearing Restrictions Yes  LLE Weight Bearing NWB  Cognition  Overall Cognitive Status Appears within functional limits for tasks assessed/performed  Arousal/Alertness Awake/alert  Orientation Level Oriented X4 / Intact  Behavior During Session [facility] for tasks performed  Bed Mobility  Bed Mobility Supine to Sit  Supine to Sit 3: Mod assist;With rails  Sitting - Scoot to Edge of Bed 2: Max assist  Details for Bed Mobility Assistance cues for sequencing, safe technique, A with L LE and trunk.    Transfers  Transfers Lateral/Scoot Transfers  Lateral/Scoot Transfers 1: +2 Total assist;With armrests removed  Lateral Transfers: Patient Percentage 40%  Details for Transfer Assistance max verbal cues for hand placement and technique for maintaining NWB L LE  Ambulation/Gait  Ambulation/Gait Assistance Not tested (comment)  Stairs No  Wheelchair Mobility  Wheelchair Mobility No  Balance  Balance Assessed No  Exercises  Exercises General Lower Extremity  General Exercises - Lower Extremity  Ankle Circles/Pumps AROM;Both;10 reps  Quad Sets AROM;Both;10 reps  PT - End of Session  Equipment Utilized During Treatment Gait belt;Cervical collar  Activity Tolerance Patient tolerated treatment well  Patient left in chair;with call bell/phone within reach  Nurse Communication Mobility status  PT - Assessment/Plan  PT Plan Discharge plan remains appropriate;Frequency remains appropriate  PT Frequency Min 5X/week  Follow Up  Recommendations Skilled nursing facility  Equipment Recommended Defer to next venue  Acute Rehab PT Goals  PT Goal: Supine/Side to Sit - Progress Progressing toward goal  PT Transfer Goal: Bed to Chair/Chair to Bed - Progress Progressing toward goal    Mack Hook, PT [phone]

## 2011-07-03 ENCOUNTER — Encounter (HOSPITAL_COMMUNITY): Payer: Self-pay | Admitting: Family Medicine

## 2011-07-03 DIAGNOSIS — S72009A Fracture of unspecified part of neck of unspecified femur, initial encounter for closed fracture: Secondary | ICD-10-CM

## 2011-07-03 DIAGNOSIS — K219 Gastro-esophageal reflux disease without esophagitis: Secondary | ICD-10-CM

## 2011-07-03 DIAGNOSIS — M069 Rheumatoid arthritis, unspecified: Secondary | ICD-10-CM

## 2011-07-03 DIAGNOSIS — I10 Essential (primary) hypertension: Secondary | ICD-10-CM

## 2011-07-03 LAB — CBC
HCT: 25.5 % — ABNORMAL LOW (ref 36.0–46.0)
Hemoglobin: 8.7 g/dL — ABNORMAL LOW (ref 12.0–15.0)
WBC: 9.6 10*3/uL (ref 4.0–10.5)

## 2011-07-03 LAB — BASIC METABOLIC PANEL
BUN: 27 mg/dL — ABNORMAL HIGH (ref 6–23)
Calcium: 8.3 mg/dL — ABNORMAL LOW (ref 8.4–10.5)
Calcium: 8.3 mg/dL — ABNORMAL LOW (ref 8.4–10.5)
Chloride: 100 mEq/L (ref 96–112)
Creatinine, Ser: 0.93 mg/dL (ref 0.50–1.10)
GFR calc Af Amer: 63 mL/min — ABNORMAL LOW (ref >90)
GFR calc non Af Amer: 54 mL/min — ABNORMAL LOW (ref >90)
GFR calc non Af Amer: 73 mL/min — ABNORMAL LOW (ref >90)
Glucose, Bld: 121 mg/dL — ABNORMAL HIGH (ref 70–99)
Glucose, Bld: 152 mg/dL — ABNORMAL HIGH (ref 70–99)
Potassium: 5 mEq/L (ref 3.5–5.1)
Sodium: 135 mEq/L (ref 135–145)

## 2011-07-03 LAB — CALCIUM, IONIZED: Calcium, Ion: 1.14 mmol/L (ref 1.12–1.32)

## 2011-07-03 LAB — TYPE AND SCREEN
Unit division: 0
Unit division: 0
Unit division: 0

## 2011-07-03 NOTE — Progress Notes (Signed)
Physical Therapy Treatment Patient Details Name: Jessica Hawkins MRN: [id number] DOB: 07-May-1922 Today's Date: 07/03/2011 Time: 1230-1340 PT Time Calculation (min): 70 min  PT Assessment / Plan / Recommendation Comments on Treatment Session       Follow Up Recommendations  Skilled nursing facility    Equipment Recommendations  Defer to next venue    Frequency Min 5X/week   Plan Discharge plan remains appropriate;Frequency remains appropriate    Precautions / Restrictions Precautions Precautions: Fall Restrictions Weight Bearing Restrictions: No LLE Weight Bearing: Non weight bearing    Pertinent Vitals/Pain Pt denied pain at rest. Mild pain with activity RN medicated pt during session.  SpO2 92% throughout session on Room air.     Mobility  Bed Mobility Bed Mobility: Supine to Sit Supine to Sit: 3: Mod assist;With rails Sitting - Scoot to Edge of Bed: 2: Max assist Sit to Supine: Not Tested (comment) Details for Bed Mobility Assistance: Assist for L LE and trunk, cues for technique and use of strap to assist in managing LLE.   Transfers Transfers: Lateral/Scoot Programmer, systems Transfers Sit to Stand: 1: +2 Total assist;From bed;With upper extremity assist;From chair/3-in-1 (three trials ) Sit to Stand: Patient Percentage: 30% Stand to Sit: 2: Max assist;To chair/3-in-1;With armrests;With upper extremity assist Stand Pivot Transfers: 1: +2 Total assist;From elevated surface;With armrests Stand Pivot Transfers: Patient Percentage: 20% Squat Pivot Transfers: 1: +2 Total assist;With upper extremity assistance;From elevated surface Squat Pivot Transfers: Patient Percentage: 20% Lateral/Scoot Transfers: With slide board;1: +2 Total assist (Unable to complete transfer.  ) Lateral Transfers: Patient Percentage: 20% Details for Transfer Assistance: Instructed pt in sequencing and technique for slide board transfer.  Unable to complete transfer despite +2 total assist.  Pt  unable to raise hips to slide across the board.  Squat pivot transfer from bed to recliner then from recliner to 3 in 1 +2 total assist to initiate standing, blocking right knee and foot, assist to manage pt's body weight while pt maintained NWB on L LE.  +2 total assist to initiate pivot (pt <20%).  Stand Pivot transfer with RW from 3 in 1 to recliner+2 total assist for all aspects of transfer including management of walker.  Pt adheres to NWB restictions well .    Exercises Total Joint Exercises Ankle Circles/Pumps: AROM;Both;5 reps Quad Sets: 5 reps;Left Short Arc Quad: Left;AROM;AAROM;10 reps (5AROM, 5 AAROM) Heel Slides: 10 reps;AAROM;Left   PT Goals Acute Rehab PT Goals PT Goal Formulation: With patient Time For Goal Achievement: 07/14/11 Potential to Achieve Goals: Fair Pt will go Supine/Side to Sit: with min assist PT Goal: Supine/Side to Sit - Progress: Progressing toward goal Pt will go Sit to Supine/Side: with min assist PT Goal: Sit to Supine/Side - Progress: Progressing toward goal Pt will go Sit to Stand: with min assist PT Goal: Sit to Stand - Progress: Progressing toward goal Pt will go Stand to Sit: with min assist PT Goal: Stand to Sit - Progress: Progressing toward goal Pt will Transfer Bed to Chair/Chair to Bed: with min assist PT Transfer Goal: Bed to Chair/Chair to Bed - Progress: Progressing toward goal  Visit Information  Last PT Received On: 07/03/11 Assistance Needed: +2    Subjective Data  Subjective: I am ready to get up.     Cognition  Overall Cognitive Status: Appears within functional limits for tasks assessed/performed Arousal/Alertness: Awake/alert Orientation Level: Oriented X4 / Intact Behavior During Session: [facility] for tasks performed    Balance  Balance Balance Assessed:  Yes Static Sitting Balance Static Sitting - Balance Support: Bilateral upper extremity supported;Feet supported Static Sitting - Level of Assistance: 4: Min assist Static  Sitting - Comment/# of Minutes: 5+ minutes sitting of EOB with min assist secondary to posteiror trunk lean and Rt lateral trunk lean (pt avoids weighbearing on Left hip) Provided pt with tactile cues through L hip to increase wt on Lt buttock to centralize pt in sitting.  Pt required repeated tactile cues to lean forward and decrease dependence on UEs.    End of Session PT - End of Session Equipment Utilized During Treatment: Gait belt;Cervical collar Activity Tolerance: Patient tolerated treatment well Patient left: in chair;with call bell/phone within reach Nurse Communication: Mobility status    Neymar Dowe 07/03/2011, 3:39 PM Sharde Gover L. Prince Couey DPT [phone]
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
## 2011-07-03 NOTE — Progress Notes (Signed)
Patient's foley removed per nursing protocol, patient has been working with PT and agrees to call for bed pan. Lorretta Harp RN
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
## 2011-07-03 NOTE — Progress Notes (Signed)
Orthopedics Progress Note  Subjective: Pt states she is feeling better today along anterior chest wall and left hip. Family concerned about her blood counts and possibility of another cause for bleeding  Objective:  Filed Vitals:   07/03/11 0514  BP: 102/77  Pulse: 88  Temp: 98.2 F (36.8 C)  Resp: 16    General: Awake and alert  Musculoskeletal: left hip incisions healing well, nv intact distally Neurovascularly intact  Lab Results  Component Value Date   WBC 9.6 07/03/2011   HGB 8.7* 07/03/2011   HCT 25.5* 07/03/2011   MCV 84.4 07/03/2011   PLT 83* 07/03/2011       Component Value Date/Time   NA 136 07/03/2011 0525   K 5.0 07/03/2011 0525   CL 100 07/03/2011 0525   CO2 28 07/03/2011 0525   GLUCOSE 121* 07/03/2011 0525   BUN 27* 07/03/2011 0525   CREATININE 0.93 07/03/2011 0525   CALCIUM 8.3* 07/03/2011 0525   GFRNONAA 54* 07/03/2011 0525   GFRAA 63* 07/03/2011 0525    Lab Results  Component Value Date   INR 1.22 06/30/2011    Assessment/Plan: POD #4 s/p Procedure(s):left INTRAMEDULLARY (IM) NAIL FEMORAL  Left hip is stable currently and pt doing well Had a long discussion with the family and patient about her progress and blood counts. Currently pt doing well but non weight bearing on the hip will cause the most difficulty with ADL's.  Agree with snf placement  Almedia Balls. Ranell Patrick, MD 07/03/2011 9:32 AM

## 2011-07-03 NOTE — Progress Notes (Signed)
Orthopedics Progress Note  Subjective: My energy has just gone.  My hip feels better.  Objective:  Filed Vitals:   07/03/11 1638  BP: 121/60  Pulse: 79  Temp: 97 F (36.1 C)  Resp: 18    General: Awake and alert  Musculoskeletal: Left hip wounds CDI.  No drainage.  NO erythema Mod swelling, but not tense around the hip area.  No swelling distally. Abdomen nontender. Neurovascularly intact  Lab Results  Component Value Date   WBC 9.6 07/03/2011   HGB 8.7* 07/03/2011   HCT 25.5* 07/03/2011   MCV 84.4 07/03/2011   PLT 83* 07/03/2011       Component Value Date/Time   NA 136 07/03/2011 0525   K 5.0 07/03/2011 0525   CL 100 07/03/2011 0525   CO2 28 07/03/2011 0525   GLUCOSE 121* 07/03/2011 0525   BUN 27* 07/03/2011 0525   CREATININE 0.93 07/03/2011 0525   CALCIUM 8.3* 07/03/2011 0525   GFRNONAA 54* 07/03/2011 0525   GFRAA 63* 07/03/2011 0525    Lab Results  Component Value Date   INR 1.22 06/30/2011    Assessment/Plan: POD #4 s/p Procedure(s): INTRAMEDULLARY (IM) NAIL FEMORAL Continues with the anemia.  She has had 4 units of PRBCs post op and I suspect that this is due directly to traumatic loss associated with the subtroch fracture and the IM nailing. I very much doubt active bleeding at this time. Continue OOB, PT, OT, D/C planning  Almedia Balls. Ranell Patrick, MD 07/03/2011 5:21 PM

## 2011-07-03 NOTE — Progress Notes (Signed)
Clinical Social Work:   Following patient for placement SNF: at [facility].  Due to medical conditions, patient still not medically stable to discharge.  Facility was notified and aware.  Agreeable to accept patient once medically stable.  No other needs.  Anticipating dc tomorrow.  07/04/2011  Dahlia Client Nail, MSW LCSW [phone]

## 2011-07-04 ENCOUNTER — Inpatient Hospital Stay (HOSPITAL_COMMUNITY): Payer: Medicare Other

## 2011-07-04 DIAGNOSIS — M069 Rheumatoid arthritis, unspecified: Secondary | ICD-10-CM

## 2011-07-04 DIAGNOSIS — IMO0002 Reserved for concepts with insufficient information to code with codable children: Secondary | ICD-10-CM | POA: Diagnosis not present

## 2011-07-04 DIAGNOSIS — K219 Gastro-esophageal reflux disease without esophagitis: Secondary | ICD-10-CM

## 2011-07-04 DIAGNOSIS — R58 Hemorrhage, not elsewhere classified: Secondary | ICD-10-CM | POA: Diagnosis not present

## 2011-07-04 DIAGNOSIS — S300XXA Contusion of lower back and pelvis, initial encounter: Secondary | ICD-10-CM | POA: Diagnosis not present

## 2011-07-04 DIAGNOSIS — I10 Essential (primary) hypertension: Secondary | ICD-10-CM

## 2011-07-04 DIAGNOSIS — S72009A Fracture of unspecified part of neck of unspecified femur, initial encounter for closed fracture: Secondary | ICD-10-CM

## 2011-07-04 LAB — BASIC METABOLIC PANEL
BUN: 22 mg/dL (ref 6–23)
CO2: 28 mEq/L (ref 19–32)
Creatinine, Ser: 0.73 mg/dL (ref 0.50–1.10)
Sodium: 137 mEq/L (ref 135–145)

## 2011-07-04 LAB — CBC
HCT: 21.9 % — ABNORMAL LOW (ref 36.0–46.0)
Hemoglobin: 7.3 g/dL — ABNORMAL LOW (ref 12.0–15.0)
MCH: 28.6 pg (ref 26.0–34.0)
MCH: 29 pg (ref 26.0–34.0)
MCHC: 33.3 g/dL (ref 30.0–36.0)
Platelets: 112 10*3/uL — ABNORMAL LOW (ref 150–400)
RBC: 2.62 MIL/uL — ABNORMAL LOW (ref 3.87–5.11)
RDW: 15.3 % (ref 11.5–15.5)
RDW: 15.3 % (ref 11.5–15.5)

## 2011-07-04 LAB — PROTIME-INR: INR: 1.06 (ref 0.00–1.49)

## 2011-07-04 LAB — APTT: aPTT: 31 seconds (ref 24–37)

## 2011-07-04 LAB — FERRITIN: Ferritin: 290 ng/mL (ref 10–291)

## 2011-07-04 MED ORDER — SORBITOL 70 % SOLN
30.0000 mL | Freq: Two times a day (BID) | Status: DC
  Administered 2011-07-04 – 2011-07-06 (×5): 30 mL via ORAL
  Filled 2011-07-04 (×6): qty 30

## 2011-07-04 MED ORDER — FERROUS SULFATE 325 (65 FE) MG PO TABS
325.0000 mg | ORAL_TABLET | Freq: Three times a day (TID) | ORAL | Status: DC
  Administered 2011-07-05 – 2011-07-08 (×10): 325 mg via ORAL
  Filled 2011-07-04 (×13): qty 1

## 2011-07-04 MED ORDER — ACETAMINOPHEN 325 MG PO TABS
650.0000 mg | ORAL_TABLET | Freq: Once | ORAL | Status: AC
  Administered 2011-07-04: 650 mg via ORAL
  Filled 2011-07-04: qty 2

## 2011-07-04 MED ORDER — FUROSEMIDE 10 MG/ML IJ SOLN
20.0000 mg | Freq: Once | INTRAMUSCULAR | Status: AC
  Administered 2011-07-04: 20 mg via INTRAVENOUS
  Filled 2011-07-04: qty 2

## 2011-07-04 MED ORDER — DIPHENHYDRAMINE HCL 12.5 MG/5ML PO ELIX
12.5000 mg | ORAL_SOLUTION | Freq: Once | ORAL | Status: AC
  Administered 2011-07-04: 12.5 mg via ORAL
  Filled 2011-07-04: qty 5

## 2011-07-04 MED ORDER — DIPHENHYDRAMINE HCL 25 MG PO CAPS: 12.5000 mg | ORAL_CAPSULE | Freq: Once | ORAL | Status: DC

## 2011-07-04 NOTE — Progress Notes (Signed)
Utilization review complete 

## 2011-07-04 NOTE — Progress Notes (Signed)
Orthopedics Progress Note  Subjective: Pt c/o mild headache this morning No pain to left hip today Awaiting snf placement  Objective:  Filed Vitals:   07/04/11 0618  BP: 106/64  Pulse: 79  Temp: 97.5 F (36.4 C)  Resp: 17    General: Awake and alert  Musculoskeletal: left hip incision healing well, no signs of drainage or erythema Neurovascularly intact  Lab Results  Component Value Date   WBC 8.6 07/04/2011   HGB 7.5* 07/04/2011   HCT 22.6* 07/04/2011   MCV 86.3 07/04/2011   PLT 112* 07/04/2011       Component Value Date/Time   NA 135 07/03/2011 1640   K 4.3 07/03/2011 1640   CL 98 07/03/2011 1640   CO2 28 07/03/2011 1640   GLUCOSE 152* 07/03/2011 1640   BUN 25* 07/03/2011 1640   CREATININE 0.79 07/03/2011 1640   CALCIUM 8.3* 07/03/2011 1640   GFRNONAA 73* 07/03/2011 1640   GFRAA 85* 07/03/2011 1640    Lab Results  Component Value Date   INR 1.22 06/30/2011    Assessment/Plan: POD #4 s/p Procedure(s):left INTRAMEDULLARY (IM) NAIL FEMORAL  Anemia: pt continues to drift down. No signs of any bleeding from the hip surgery. Transfusions per medical team. Currently the hip and neck are stable Will continue to follow  Almedia Balls. Ranell Patrick, MD 07/04/2011 7:54 AM
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
## 2011-07-04 NOTE — Progress Notes (Signed)
Physical Therapy Treatment Patient Details Name: Jessica Hawkins MRN: [id number] DOB: April 11, 1922 Today's Date: 07/04/2011 Time: 4403-4742 PT Time Calculation (min): 42 min  PT Assessment / Plan / Recommendation Comments on Treatment Session  Pt very pleasant & motivated to participate in therapy.  Happy to be getting out of bed.  Continues to required +2 total (A) at this time for transfers.      Follow Up Recommendations  Skilled nursing facility    Equipment Recommendations  Defer to next venue    Frequency Min 5X/week   Plan Discharge plan remains appropriate;Frequency remains appropriate    Precautions / Restrictions Precautions Precautions: Fall Restrictions Weight Bearing Restrictions: Yes LLE Weight Bearing: Non weight bearing       Mobility  Bed Mobility Bed Mobility: Supine to Sit Supine to Sit: 3: Mod assist;HOB elevated;With rails (HOB elevated 40 degrees) Sitting - Scoot to Edge of Bed: 2: Max assist Sit to Supine: Not Tested (comment) Details for Bed Mobility Assistance: (A) for LLE & trunk.  Cues for technique.  Use of draw pad to scoot hips closer to EOB while support given at shoulders for stability.   Transfers Transfers: Heritage manager Transfers: 1: +2 Total assist;From elevated surface;With armrests;With upper extremity assistance Squat Pivot Transfers: Patient Percentage: 30% Details for Transfer Assistance: Cues for initiation, hand placement, technique.  (A) to lift hips off of surface, balance/manage pt's body weight, rotation of hips, & safety.      Exercises     PT Goals Acute Rehab PT Goals PT Goal: Supine/Side to Sit - Progress: Not met PT Transfer Goal: Bed to Chair/Chair to Bed - Progress: Not met  Visit Information  Last PT Received On: 07/04/11 Assistance Needed: +2    Subjective Data      Cognition  Overall Cognitive Status: Appears within functional limits for tasks assessed/performed Arousal/Alertness:  Awake/alert Orientation Level: Oriented X4 / Intact Behavior During Session: [facility] for tasks performed    Balance     End of Session PT - End of Session Activity Tolerance: Patient tolerated treatment well Patient left: in chair;with call bell/phone within reach;with family/visitor present Nurse Communication: Need for lift equipment    Verdell Face, PTA [phone] 07/04/2011

## 2011-07-05 ENCOUNTER — Encounter (HOSPITAL_COMMUNITY): Payer: Self-pay | Admitting: Family Medicine

## 2011-07-05 DIAGNOSIS — I10 Essential (primary) hypertension: Secondary | ICD-10-CM

## 2011-07-05 DIAGNOSIS — K219 Gastro-esophageal reflux disease without esophagitis: Secondary | ICD-10-CM

## 2011-07-05 DIAGNOSIS — S72009A Fracture of unspecified part of neck of unspecified femur, initial encounter for closed fracture: Secondary | ICD-10-CM

## 2011-07-05 DIAGNOSIS — M069 Rheumatoid arthritis, unspecified: Secondary | ICD-10-CM

## 2011-07-05 DIAGNOSIS — D691 Qualitative platelet defects: Secondary | ICD-10-CM | POA: Insufficient documentation

## 2011-07-05 LAB — TYPE AND SCREEN
ABO/RH(D): O POS
Unit division: 0

## 2011-07-05 LAB — CBC
HCT: 26.3 % — ABNORMAL LOW (ref 36.0–46.0)
Hemoglobin: 8.6 g/dL — ABNORMAL LOW (ref 12.0–15.0)
RBC: 2.98 MIL/uL — ABNORMAL LOW (ref 3.87–5.11)
WBC: 8.3 10*3/uL (ref 4.0–10.5)

## 2011-07-05 LAB — PLATELET FUNCTION ASSAY
Collagen / ADP: 145 seconds (ref 0–108)
Collagen / Epinephrine: >300 seconds (ref 0–197)

## 2011-07-05 NOTE — Progress Notes (Signed)
CRITICAL VALUE ALERT  Critical value received:  Collagen epinephrine >300/collagen ADP 145  Date of notification:  07/05/2011   Time of notification:  0340  Critical value read back:yes  Nurse who received alert:  Hively, Avie Echevaria, RN  MD notified (1st page):  Benedetto Coons  Time of first page:  3:48 AM   MD notified (2nd page):  Time of second page:  Responding MD:    Time MD responded:
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
## 2011-07-05 NOTE — Progress Notes (Signed)
Clinical Social Worker continuing to follow for disposition planning. Plan is for pt to discharge to [facility] when medically stable. Due to medical conditions, patient still not medically stable to discharge today. Clinical Social Worker received phone call from MD at 4pm stating that pt may be ready for discharge today and asked Clinical Social Worker to discuss with facility if they could admit during weekend. Clinical Social Worker contacted [facility] who stated that they were unable to accept pt during weekend, but could accept pt on Monday. MD notified. Clinical Social Worker to facilitate pt discharge needs to [facility] when pt medically stable for discharge.  Jacklynn Lewis, MSW, LCSWA  Clinical Social Work [phone]

## 2011-07-06 DIAGNOSIS — M069 Rheumatoid arthritis, unspecified: Secondary | ICD-10-CM

## 2011-07-06 DIAGNOSIS — S72009A Fracture of unspecified part of neck of unspecified femur, initial encounter for closed fracture: Secondary | ICD-10-CM

## 2011-07-06 DIAGNOSIS — K219 Gastro-esophageal reflux disease without esophagitis: Secondary | ICD-10-CM

## 2011-07-06 DIAGNOSIS — I10 Essential (primary) hypertension: Secondary | ICD-10-CM

## 2011-07-06 LAB — CBC
HCT: 25.7 % — ABNORMAL LOW (ref 36.0–46.0)
MCHC: 33.1 g/dL (ref 30.0–36.0)
Platelets: 181 10*3/uL (ref 150–400)
RDW: 15.3 % (ref 11.5–15.5)

## 2011-07-06 MED ORDER — SENNOSIDES-DOCUSATE SODIUM 8.6-50 MG PO TABS
1.0000 | ORAL_TABLET | Freq: Two times a day (BID) | ORAL | Status: DC
  Administered 2011-07-06 – 2011-07-08 (×4): 1 via ORAL
  Filled 2011-07-06 (×2): qty 1

## 2011-07-06 MED ORDER — SORBITOL 70 % SOLN
30.0000 mL | Freq: Three times a day (TID) | Status: DC
  Administered 2011-07-06 – 2011-07-08 (×6): 30 mL via ORAL
  Filled 2011-07-06 (×8): qty 30
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
## 2011-07-06 NOTE — Progress Notes (Addendum)
Subjective: 7 Days Post-Op Procedure(s) (LRB): INTRAMEDULLARY (IM) NAIL FEMORAL (Left) Patient reports pain as mild.    Objective: Vital signs in last 24 hours: Temp:  [97.5 F (36.4 C)-99.5 F (37.5 C)] 97.5 F (36.4 C) (05/04 0614) Pulse Rate:  [79-95] 79  (05/04 0614) Resp:  [16-18] 18  (05/04 0614) BP: (119-126)/(43-47) 120/47 mmHg (05/04 0614) SpO2:  [100 %] 100 % (05/04 0614)  Intake/Output from previous day: 05/03 0701 - 05/04 0700 In: 840 [P.O.:840] Out: -  Intake/Output this shift:     Basename 07/06/11 0500 07/05/11 0635 07/04/11 1319 07/04/11 0645  HGB 8.5* 8.6* 7.3* 7.5*    Basename 07/06/11 0500 07/05/11 0635  WBC 9.0 8.3  RBC 2.89* 2.98*  HCT 25.7* 26.3*  PLT 181 137*    Basename 07/04/11 0645 07/03/11 1640  NA 137 135  K 4.0 4.3  CL 101 98  CO2 28 28  BUN 22 25*  CREATININE 0.73 0.79  GLUCOSE 111* 152*  CALCIUM 8.3* 8.3*    Basename 07/04/11 2135  LABPT --  INR 1.06    Wound clean, dry, intact with no erythema.   Assessment/Plan: 7 Days Post-Op Procedure(s) (LRB): INTRAMEDULLARY (IM) NAIL FEMORAL (Left) Discharge to SNF  Arora Coakley A 07/06/2011, 10:54 AM  Stable orthopaedically for D/C to SNF (skilled nursing facility)

## 2011-07-07 DIAGNOSIS — M069 Rheumatoid arthritis, unspecified: Secondary | ICD-10-CM

## 2011-07-07 DIAGNOSIS — S72009A Fracture of unspecified part of neck of unspecified femur, initial encounter for closed fracture: Secondary | ICD-10-CM

## 2011-07-07 DIAGNOSIS — K219 Gastro-esophageal reflux disease without esophagitis: Secondary | ICD-10-CM

## 2011-07-07 DIAGNOSIS — I10 Essential (primary) hypertension: Secondary | ICD-10-CM

## 2011-07-07 LAB — BASIC METABOLIC PANEL
CO2: 31 mEq/L (ref 19–32)
Calcium: 8.4 mg/dL (ref 8.4–10.5)
GFR calc non Af Amer: 82 mL/min — ABNORMAL LOW (ref >90)
Potassium: 4.6 mEq/L (ref 3.5–5.1)
Sodium: 137 mEq/L (ref 135–145)

## 2011-07-07 LAB — CBC
MCH: 28.9 pg (ref 26.0–34.0)
Platelets: 226 10*3/uL (ref 150–400)
RBC: 2.87 MIL/uL — ABNORMAL LOW (ref 3.87–5.11)
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
## 2011-07-07 NOTE — Progress Notes (Signed)
Orthopedics Progress Note  Subjective: Sleeping comfortably  Objective:  Filed Vitals:   07/07/11 0654  BP: 145/80  Pulse: 98  Temp: 98.4 F (36.9 C)  Resp: 18    General: Awake and alert  Musculoskeletal: left hip wounds CDI Neurovascularly intact  Lab Results  Component Value Date   WBC 10.5 07/07/2011   HGB 8.3* 07/07/2011   HCT 25.3* 07/07/2011   MCV 88.2 07/07/2011   PLT 226 07/07/2011       Component Value Date/Time   NA 137 07/07/2011 0515   K 4.6 07/07/2011 0515   CL 100 07/07/2011 0515   CO2 31 07/07/2011 0515   GLUCOSE 119* 07/07/2011 0515   BUN 12 07/07/2011 0515   CREATININE 0.57 07/07/2011 0515   CALCIUM 8.4 07/07/2011 0515   GFRNONAA 82* 07/07/2011 0515   GFRAA >90 07/07/2011 0515    Lab Results  Component Value Date   INR 1.06 07/04/2011   INR 1.22 06/30/2011    Assessment/Plan:  s/p Procedure(s): INTRAMEDULLARY (IM) NAIL FEMORAL Patient stable for d/c from ortho standpoint. Hgb > 8.0 No symptoms currently of SOB or dizziness. Continue PT, OT and D/C planning.  Almedia Balls. Ranell Patrick, MD 07/07/2011 8:10 AM

## 2011-07-08 DIAGNOSIS — G4730 Sleep apnea, unspecified: Secondary | ICD-10-CM | POA: Diagnosis not present

## 2011-07-08 DIAGNOSIS — M25519 Pain in unspecified shoulder: Secondary | ICD-10-CM | POA: Diagnosis not present

## 2011-07-08 DIAGNOSIS — M7989 Other specified soft tissue disorders: Secondary | ICD-10-CM | POA: Diagnosis not present

## 2011-07-08 DIAGNOSIS — S72143A Displaced intertrochanteric fracture of unspecified femur, initial encounter for closed fracture: Secondary | ICD-10-CM | POA: Diagnosis not present

## 2011-07-08 DIAGNOSIS — D62 Acute posthemorrhagic anemia: Secondary | ICD-10-CM | POA: Diagnosis not present

## 2011-07-08 DIAGNOSIS — R079 Chest pain, unspecified: Secondary | ICD-10-CM | POA: Diagnosis not present

## 2011-07-08 DIAGNOSIS — E785 Hyperlipidemia, unspecified: Secondary | ICD-10-CM | POA: Diagnosis not present

## 2011-07-08 DIAGNOSIS — S7223XA Displaced subtrochanteric fracture of unspecified femur, initial encounter for closed fracture: Secondary | ICD-10-CM | POA: Diagnosis not present

## 2011-07-08 DIAGNOSIS — M79609 Pain in unspecified limb: Secondary | ICD-10-CM | POA: Diagnosis not present

## 2011-07-08 DIAGNOSIS — S7290XA Unspecified fracture of unspecified femur, initial encounter for closed fracture: Secondary | ICD-10-CM | POA: Diagnosis not present

## 2011-07-08 DIAGNOSIS — D649 Anemia, unspecified: Secondary | ICD-10-CM | POA: Diagnosis not present

## 2011-07-08 DIAGNOSIS — Z5189 Encounter for other specified aftercare: Secondary | ICD-10-CM | POA: Diagnosis not present

## 2011-07-08 DIAGNOSIS — S72009D Fracture of unspecified part of neck of unspecified femur, subsequent encounter for closed fracture with routine healing: Secondary | ICD-10-CM | POA: Diagnosis not present

## 2011-07-08 DIAGNOSIS — I10 Essential (primary) hypertension: Secondary | ICD-10-CM | POA: Diagnosis not present

## 2011-07-08 DIAGNOSIS — S72009A Fracture of unspecified part of neck of unspecified femur, initial encounter for closed fracture: Secondary | ICD-10-CM | POA: Diagnosis not present

## 2011-07-08 DIAGNOSIS — M810 Age-related osteoporosis without current pathological fracture: Secondary | ICD-10-CM | POA: Diagnosis not present

## 2011-07-08 DIAGNOSIS — E119 Type 2 diabetes mellitus without complications: Secondary | ICD-10-CM | POA: Diagnosis not present

## 2011-07-08 DIAGNOSIS — R350 Frequency of micturition: Secondary | ICD-10-CM | POA: Diagnosis not present

## 2011-07-08 DIAGNOSIS — M1990 Unspecified osteoarthritis, unspecified site: Secondary | ICD-10-CM | POA: Diagnosis not present

## 2011-07-08 DIAGNOSIS — K5900 Constipation, unspecified: Secondary | ICD-10-CM | POA: Diagnosis not present

## 2011-07-08 DIAGNOSIS — K219 Gastro-esophageal reflux disease without esophagitis: Secondary | ICD-10-CM | POA: Diagnosis not present

## 2011-07-08 DIAGNOSIS — D531 Other megaloblastic anemias, not elsewhere classified: Secondary | ICD-10-CM | POA: Diagnosis not present

## 2011-07-08 DIAGNOSIS — K2100 Gastro-esophageal reflux disease with esophagitis, without bleeding: Secondary | ICD-10-CM | POA: Diagnosis not present

## 2011-07-08 DIAGNOSIS — N390 Urinary tract infection, site not specified: Secondary | ICD-10-CM | POA: Diagnosis not present

## 2011-07-08 DIAGNOSIS — M069 Rheumatoid arthritis, unspecified: Secondary | ICD-10-CM | POA: Diagnosis not present

## 2011-07-08 DIAGNOSIS — E039 Hypothyroidism, unspecified: Secondary | ICD-10-CM | POA: Diagnosis not present

## 2011-07-08 LAB — CBC
HCT: 26.6 % — ABNORMAL LOW (ref 36.0–46.0)
MCHC: 32 g/dL (ref 30.0–36.0)
Platelets: 275 10*3/uL (ref 150–400)
RDW: 15.1 % (ref 11.5–15.5)
WBC: 9.7 10*3/uL (ref 4.0–10.5)

## 2011-07-08 MED ORDER — METOPROLOL TARTRATE 12.5 MG HALF TABLET: 12.5000 mg | ORAL_TABLET | Freq: Two times a day (BID) | ORAL | Status: DC

## 2011-07-08 MED ORDER — FERROUS SULFATE 325 (65 FE) MG PO TABS: 325.0000 mg | ORAL_TABLET | Freq: Three times a day (TID) | ORAL | Status: DC

## 2011-07-08 MED ORDER — HYDROCODONE-ACETAMINOPHEN 5-325 MG PO TABS: 1.0000 | ORAL_TABLET | ORAL | Status: AC | PRN

## 2011-07-08 MED ORDER — SORBITOL 70 % SOLN: 30.0000 mL | Freq: Three times a day (TID) | Status: DC

## 2011-07-08 MED ORDER — METHOCARBAMOL 500 MG PO TABS: 500.0000 mg | ORAL_TABLET | Freq: Three times a day (TID) | ORAL | Status: AC | PRN

## 2011-07-08 MED ORDER — SENNOSIDES-DOCUSATE SODIUM 8.6-50 MG PO TABS: 1.0000 | ORAL_TABLET | Freq: Two times a day (BID) | ORAL | Status: DC
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
## 2011-07-08 NOTE — Progress Notes (Signed)
Physical Therapy Treatment Note   07/08/11 0900  PT Visit Information  Last PT Received On 07/08/11  Assistance Needed +2  PT Time Calculation  PT Start Time 0900  PT Stop Time 0931  PT Time Calculation (min) 31 min  Subjective Data  Subjective "I can't understand everyones directions."  Precautions  Precautions Fall  Required Braces or Orthoses Cervical Brace  Cervical Brace Hard collar (re-adjusted in supine to provide optimal support due to inappropriate fitting)  Restrictions  Weight Bearing Restrictions Yes  LLE Weight Bearing NWB  Cognition  Overall Cognitive Status Appears within functional limits for tasks assessed/performed  Arousal/Alertness Awake/alert  Orientation Level Oriented X4 / Intact  Behavior During Session Anxious  Cognition - Other Comments delayed processing, v/c's to stay on task  Bed Mobility  Bed Mobility Supine to Sit;Sit to Supine  Supine to Sit 1: +2 Total assist;HOB flat;With rails  Supine to Sit: Patient Percentage 30%  Sitting - Scoot to Edge of Bed 2: Max assist  Sit to Supine 1: +2 Total assist;HOB flat  Sit to Supine: Patient Percentage 20%  Details for Bed Mobility Assistance assist for L LE, elevated trunk, v/c's for sequencing, patient with strong retro pulsion  Transfers  Transfers Sit to Stand  Sit to Stand 1: +2 Total assist;With upper extremity assist;With armrests;From bed  Sit to Stand: Patient Percentage 30%  Details for Transfer Assistance pt with increased anxiety this date and strongly deferred transferred to chair this date due to leaving today. patient unable to maintain L LE NWB even though she's aware of precaution. Patient unable to achieve FWB on R LE to compensate for L LE NWB. attempted x 2 but unable to achieve and hold full upright position. pt with minimal assist with UEs despite freq verbal cues  Ambulation/Gait  Ambulation/Gait Assistance Not tested (comment)  Static Sitting Balance  Static Sitting - Balance  Support Bilateral upper extremity supported;Feet supported  Static Sitting - Level of Assistance 4: Min assist  Static Sitting - Comment/# of Minutes pt with strong retropulsion requiring freq verbal and tactile cues to maintain upright posture  PT - End of Session  Equipment Utilized During Treatment Gait belt;Cervical collar  Activity Tolerance Patient limited by pain (pt limited by anxiety)  Patient left in bed;with call bell/phone within reach;with bed alarm set  Nurse Communication Mobility status;Patient requests pain meds  PT - Assessment/Plan  Comments on Treatment Session Pt con't to progressing very slowly and had increased anxiety this date. Patient unable to achieve standing or maintain L LE NWB. patient very deconditioned and has increased pain at sternum and neck limiting abiltiy for patient to use UEs to assist. Patient to con't to benefit from SNF placement for maximal functional recovery and adequate assist for all mobiltiy and ADLs.  PT Plan Discharge plan remains appropriate;Frequency needs to be updated  PT Frequency Min 3X/week  Follow Up Recommendations Skilled nursing facility  Equipment Recommended Defer to next venue  Acute Rehab PT Goals  PT Goal: Supine/Side to Sit - Progress Progressing toward goal  PT Goal: Sit to Supine/Side - Progress Progressing toward goal  PT Goal: Sit to Stand - Progress Progressing toward goal  PT Goal: Stand to Sit - Progress Progressing toward goal   Pain: 4/10 at rest at sternum and neck, increased to 7/10 with mvmt   Lewis Shock, PT, DPT Pager #: [phone] Office #: [phone]

## 2011-07-08 NOTE — Progress Notes (Signed)
Covering Clinical Child psychotherapist (CSW) contacted pt daughter who informed CSW that daughter is on the way back to the hospital and is fine with CSW contacted [facility] now for transport. CSW aware pt eating lunch and CSW will contact [facility] for a 12:45p transport to [facility] following. CSW has placed pt dc packet in pt wall-a-roo.  CSW signing off.  Theresia Bough, MSW, Theresia Majors [phone]

## 2011-07-08 NOTE — Care Management Note (Signed)
    Page 1 of 1   07/08/2011     1:55:15 PM   CARE MANAGEMENT NOTE 07/08/2011  Patient:  Jessica Hawkins, Jessica Hawkins   Account Number:  [id number]  Date Initiated:  07/01/2011  Documentation initiated by:  Letha Cape  Subjective/Objective Assessment:   dx intertrochanteric fx of left femur.  admit- lives alone.     Action/Plan:   pt recs snf.   Anticipated DC Date:  07/08/2011   Anticipated DC Plan:  SKILLED NURSING FACILITY  In-house referral  Clinical Social Worker      DC Planning Services  CM consult      Choice offered to / List presented to:             Status of service:  Completed, signed off Medicare Important Message given?   (If response is "NO", the following Medicare IM given date fields will be blank) Date Medicare IM given:   Date Additional Medicare IM given:    Discharge Disposition:  SKILLED NURSING FACILITY  Per UR Regulation:    If discussed at Long Length of Stay Meetings, dates discussed:    Comments:  07/08/11 13:54 Letha Cape RN, BSN [phone] patient dc to snf today.  07/03/11 16:06 Letha Cape RN, BSN [phone] patient is for possible dc today.  07/01/11 10:29 Letha Cape RN, BSN [phone] patient lives alone.  Per physical therapy recs snf.  CSW referral.

## 2011-07-08 NOTE — Progress Notes (Signed)
Covering Clinical Child psychotherapist (CSW) informed pt ready for [employer] to [facility] today. CSW spoke with pt daughter who informed CSW that she completed paperwork for pt last week. CSW left a message for facility just to confirm pt okay to be admitted today. CSW has faxed pt dc summary to facility and will facilitate with dc once confirmation received from [facility].  Theresia Bough, MSW, Theresia Majors [phone]

## 2011-07-09 DIAGNOSIS — D62 Acute posthemorrhagic anemia: Secondary | ICD-10-CM | POA: Diagnosis not present

## 2011-07-09 DIAGNOSIS — E785 Hyperlipidemia, unspecified: Secondary | ICD-10-CM | POA: Diagnosis not present

## 2011-07-09 DIAGNOSIS — I10 Essential (primary) hypertension: Secondary | ICD-10-CM | POA: Diagnosis not present

## 2011-07-09 DIAGNOSIS — M79609 Pain in unspecified limb: Secondary | ICD-10-CM | POA: Diagnosis not present

## 2011-07-09 DIAGNOSIS — E039 Hypothyroidism, unspecified: Secondary | ICD-10-CM | POA: Diagnosis not present

## 2011-07-09 DIAGNOSIS — E119 Type 2 diabetes mellitus without complications: Secondary | ICD-10-CM | POA: Diagnosis not present

## 2011-07-11 DIAGNOSIS — E119 Type 2 diabetes mellitus without complications: Secondary | ICD-10-CM | POA: Diagnosis not present

## 2011-07-11 DIAGNOSIS — K5900 Constipation, unspecified: Secondary | ICD-10-CM | POA: Diagnosis not present

## 2011-07-11 DIAGNOSIS — I10 Essential (primary) hypertension: Secondary | ICD-10-CM | POA: Diagnosis not present

## 2011-07-11 DIAGNOSIS — D62 Acute posthemorrhagic anemia: Secondary | ICD-10-CM | POA: Diagnosis not present

## 2011-07-11 DIAGNOSIS — S72143A Displaced intertrochanteric fracture of unspecified femur, initial encounter for closed fracture: Secondary | ICD-10-CM | POA: Diagnosis not present

## 2011-07-11 DIAGNOSIS — S72009D Fracture of unspecified part of neck of unspecified femur, subsequent encounter for closed fracture with routine healing: Secondary | ICD-10-CM | POA: Diagnosis not present

## 2011-07-16 DIAGNOSIS — S7223XA Displaced subtrochanteric fracture of unspecified femur, initial encounter for closed fracture: Secondary | ICD-10-CM | POA: Diagnosis not present

## 2011-07-22 DIAGNOSIS — E119 Type 2 diabetes mellitus without complications: Secondary | ICD-10-CM | POA: Diagnosis not present

## 2011-07-22 DIAGNOSIS — D62 Acute posthemorrhagic anemia: Secondary | ICD-10-CM | POA: Diagnosis not present

## 2011-07-22 DIAGNOSIS — E039 Hypothyroidism, unspecified: Secondary | ICD-10-CM | POA: Diagnosis not present

## 2011-07-22 DIAGNOSIS — I10 Essential (primary) hypertension: Secondary | ICD-10-CM | POA: Diagnosis not present

## 2011-07-22 DIAGNOSIS — R350 Frequency of micturition: Secondary | ICD-10-CM | POA: Diagnosis not present

## 2011-07-30 DIAGNOSIS — D62 Acute posthemorrhagic anemia: Secondary | ICD-10-CM | POA: Diagnosis not present

## 2011-07-30 DIAGNOSIS — I10 Essential (primary) hypertension: Secondary | ICD-10-CM | POA: Diagnosis not present

## 2011-07-30 DIAGNOSIS — R350 Frequency of micturition: Secondary | ICD-10-CM | POA: Diagnosis not present

## 2011-07-30 DIAGNOSIS — K2100 Gastro-esophageal reflux disease with esophagitis, without bleeding: Secondary | ICD-10-CM | POA: Diagnosis not present

## 2011-07-30 DIAGNOSIS — E039 Hypothyroidism, unspecified: Secondary | ICD-10-CM | POA: Diagnosis not present

## 2011-07-30 DIAGNOSIS — E119 Type 2 diabetes mellitus without complications: Secondary | ICD-10-CM | POA: Diagnosis not present

## 2011-08-12 DIAGNOSIS — K2100 Gastro-esophageal reflux disease with esophagitis, without bleeding: Secondary | ICD-10-CM | POA: Diagnosis not present

## 2011-08-12 DIAGNOSIS — E119 Type 2 diabetes mellitus without complications: Secondary | ICD-10-CM | POA: Diagnosis not present

## 2011-08-12 DIAGNOSIS — E039 Hypothyroidism, unspecified: Secondary | ICD-10-CM | POA: Diagnosis not present

## 2011-08-12 DIAGNOSIS — S72143A Displaced intertrochanteric fracture of unspecified femur, initial encounter for closed fracture: Secondary | ICD-10-CM | POA: Diagnosis not present

## 2011-08-12 DIAGNOSIS — I10 Essential (primary) hypertension: Secondary | ICD-10-CM | POA: Diagnosis not present

## 2011-08-15 DIAGNOSIS — S7290XA Unspecified fracture of unspecified femur, initial encounter for closed fracture: Secondary | ICD-10-CM | POA: Diagnosis not present

## 2011-09-12 DIAGNOSIS — S7290XA Unspecified fracture of unspecified femur, initial encounter for closed fracture: Secondary | ICD-10-CM | POA: Diagnosis not present

## 2011-09-30 DIAGNOSIS — E039 Hypothyroidism, unspecified: Secondary | ICD-10-CM | POA: Diagnosis not present

## 2011-09-30 DIAGNOSIS — E119 Type 2 diabetes mellitus without complications: Secondary | ICD-10-CM | POA: Diagnosis not present

## 2011-09-30 DIAGNOSIS — S72143A Displaced intertrochanteric fracture of unspecified femur, initial encounter for closed fracture: Secondary | ICD-10-CM | POA: Diagnosis not present

## 2011-09-30 DIAGNOSIS — K2100 Gastro-esophageal reflux disease with esophagitis, without bleeding: Secondary | ICD-10-CM | POA: Diagnosis not present

## 2011-09-30 DIAGNOSIS — M25519 Pain in unspecified shoulder: Secondary | ICD-10-CM | POA: Diagnosis not present

## 2011-09-30 DIAGNOSIS — I10 Essential (primary) hypertension: Secondary | ICD-10-CM | POA: Diagnosis not present

## 2011-10-10 DIAGNOSIS — S7290XA Unspecified fracture of unspecified femur, initial encounter for closed fracture: Secondary | ICD-10-CM | POA: Diagnosis not present

## 2011-10-15 DIAGNOSIS — S72143A Displaced intertrochanteric fracture of unspecified femur, initial encounter for closed fracture: Secondary | ICD-10-CM | POA: Diagnosis not present

## 2011-10-15 DIAGNOSIS — E039 Hypothyroidism, unspecified: Secondary | ICD-10-CM | POA: Diagnosis not present

## 2011-10-15 DIAGNOSIS — K2100 Gastro-esophageal reflux disease with esophagitis, without bleeding: Secondary | ICD-10-CM | POA: Diagnosis not present

## 2011-10-15 DIAGNOSIS — G4730 Sleep apnea, unspecified: Secondary | ICD-10-CM | POA: Diagnosis not present

## 2011-10-15 DIAGNOSIS — I10 Essential (primary) hypertension: Secondary | ICD-10-CM | POA: Diagnosis not present

## 2011-10-15 DIAGNOSIS — E119 Type 2 diabetes mellitus without complications: Secondary | ICD-10-CM | POA: Diagnosis not present

## 2011-10-17 DIAGNOSIS — S72009D Fracture of unspecified part of neck of unspecified femur, subsequent encounter for closed fracture with routine healing: Secondary | ICD-10-CM | POA: Diagnosis not present

## 2011-10-17 DIAGNOSIS — E119 Type 2 diabetes mellitus without complications: Secondary | ICD-10-CM | POA: Diagnosis not present

## 2011-10-17 DIAGNOSIS — IMO0001 Reserved for inherently not codable concepts without codable children: Secondary | ICD-10-CM | POA: Diagnosis not present

## 2011-10-17 DIAGNOSIS — I10 Essential (primary) hypertension: Secondary | ICD-10-CM | POA: Diagnosis not present

## 2011-10-17 DIAGNOSIS — D62 Acute posthemorrhagic anemia: Secondary | ICD-10-CM | POA: Diagnosis not present

## 2011-10-18 DIAGNOSIS — IMO0001 Reserved for inherently not codable concepts without codable children: Secondary | ICD-10-CM | POA: Diagnosis not present

## 2011-10-18 DIAGNOSIS — D62 Acute posthemorrhagic anemia: Secondary | ICD-10-CM | POA: Diagnosis not present

## 2011-10-18 DIAGNOSIS — I10 Essential (primary) hypertension: Secondary | ICD-10-CM | POA: Diagnosis not present

## 2011-10-18 DIAGNOSIS — E119 Type 2 diabetes mellitus without complications: Secondary | ICD-10-CM | POA: Diagnosis not present

## 2011-10-18 DIAGNOSIS — S72009D Fracture of unspecified part of neck of unspecified femur, subsequent encounter for closed fracture with routine healing: Secondary | ICD-10-CM | POA: Diagnosis not present

## 2011-10-21 DIAGNOSIS — D62 Acute posthemorrhagic anemia: Secondary | ICD-10-CM | POA: Diagnosis not present

## 2011-10-21 DIAGNOSIS — I10 Essential (primary) hypertension: Secondary | ICD-10-CM | POA: Diagnosis not present

## 2011-10-21 DIAGNOSIS — IMO0001 Reserved for inherently not codable concepts without codable children: Secondary | ICD-10-CM | POA: Diagnosis not present

## 2011-10-21 DIAGNOSIS — E119 Type 2 diabetes mellitus without complications: Secondary | ICD-10-CM | POA: Diagnosis not present

## 2011-10-21 DIAGNOSIS — S72009D Fracture of unspecified part of neck of unspecified femur, subsequent encounter for closed fracture with routine healing: Secondary | ICD-10-CM | POA: Diagnosis not present

## 2011-10-22 DIAGNOSIS — E119 Type 2 diabetes mellitus without complications: Secondary | ICD-10-CM | POA: Diagnosis not present

## 2011-10-22 DIAGNOSIS — I10 Essential (primary) hypertension: Secondary | ICD-10-CM | POA: Diagnosis not present

## 2011-10-22 DIAGNOSIS — D62 Acute posthemorrhagic anemia: Secondary | ICD-10-CM | POA: Diagnosis not present

## 2011-10-22 DIAGNOSIS — S72009D Fracture of unspecified part of neck of unspecified femur, subsequent encounter for closed fracture with routine healing: Secondary | ICD-10-CM | POA: Diagnosis not present

## 2011-10-22 DIAGNOSIS — IMO0001 Reserved for inherently not codable concepts without codable children: Secondary | ICD-10-CM | POA: Diagnosis not present

## 2011-10-23 DIAGNOSIS — D62 Acute posthemorrhagic anemia: Secondary | ICD-10-CM | POA: Diagnosis not present

## 2011-10-23 DIAGNOSIS — IMO0001 Reserved for inherently not codable concepts without codable children: Secondary | ICD-10-CM | POA: Diagnosis not present

## 2011-10-23 DIAGNOSIS — I10 Essential (primary) hypertension: Secondary | ICD-10-CM | POA: Diagnosis not present

## 2011-10-23 DIAGNOSIS — S72009D Fracture of unspecified part of neck of unspecified femur, subsequent encounter for closed fracture with routine healing: Secondary | ICD-10-CM | POA: Diagnosis not present

## 2011-10-23 DIAGNOSIS — E119 Type 2 diabetes mellitus without complications: Secondary | ICD-10-CM | POA: Diagnosis not present

## 2011-10-24 DIAGNOSIS — E119 Type 2 diabetes mellitus without complications: Secondary | ICD-10-CM | POA: Diagnosis not present

## 2011-10-24 DIAGNOSIS — D62 Acute posthemorrhagic anemia: Secondary | ICD-10-CM | POA: Diagnosis not present

## 2011-10-24 DIAGNOSIS — IMO0001 Reserved for inherently not codable concepts without codable children: Secondary | ICD-10-CM | POA: Diagnosis not present

## 2011-10-24 DIAGNOSIS — S72009D Fracture of unspecified part of neck of unspecified femur, subsequent encounter for closed fracture with routine healing: Secondary | ICD-10-CM | POA: Diagnosis not present

## 2011-10-24 DIAGNOSIS — K5900 Constipation, unspecified: Secondary | ICD-10-CM | POA: Diagnosis not present

## 2011-10-24 DIAGNOSIS — R109 Unspecified abdominal pain: Secondary | ICD-10-CM | POA: Diagnosis not present

## 2011-10-24 DIAGNOSIS — I10 Essential (primary) hypertension: Secondary | ICD-10-CM | POA: Diagnosis not present

## 2011-10-25 DIAGNOSIS — S72009D Fracture of unspecified part of neck of unspecified femur, subsequent encounter for closed fracture with routine healing: Secondary | ICD-10-CM | POA: Diagnosis not present

## 2011-10-25 DIAGNOSIS — D62 Acute posthemorrhagic anemia: Secondary | ICD-10-CM | POA: Diagnosis not present

## 2011-10-25 DIAGNOSIS — IMO0001 Reserved for inherently not codable concepts without codable children: Secondary | ICD-10-CM | POA: Diagnosis not present

## 2011-10-25 DIAGNOSIS — E119 Type 2 diabetes mellitus without complications: Secondary | ICD-10-CM | POA: Diagnosis not present

## 2011-10-25 DIAGNOSIS — I10 Essential (primary) hypertension: Secondary | ICD-10-CM | POA: Diagnosis not present

## 2011-10-28 DIAGNOSIS — D62 Acute posthemorrhagic anemia: Secondary | ICD-10-CM | POA: Diagnosis not present

## 2011-10-28 DIAGNOSIS — E119 Type 2 diabetes mellitus without complications: Secondary | ICD-10-CM | POA: Diagnosis not present

## 2011-10-28 DIAGNOSIS — IMO0001 Reserved for inherently not codable concepts without codable children: Secondary | ICD-10-CM | POA: Diagnosis not present

## 2011-10-28 DIAGNOSIS — I10 Essential (primary) hypertension: Secondary | ICD-10-CM | POA: Diagnosis not present

## 2011-10-28 DIAGNOSIS — S72009D Fracture of unspecified part of neck of unspecified femur, subsequent encounter for closed fracture with routine healing: Secondary | ICD-10-CM | POA: Diagnosis not present

## 2011-10-29 DIAGNOSIS — IMO0001 Reserved for inherently not codable concepts without codable children: Secondary | ICD-10-CM | POA: Diagnosis not present

## 2011-10-29 DIAGNOSIS — D62 Acute posthemorrhagic anemia: Secondary | ICD-10-CM | POA: Diagnosis not present

## 2011-10-29 DIAGNOSIS — S72009D Fracture of unspecified part of neck of unspecified femur, subsequent encounter for closed fracture with routine healing: Secondary | ICD-10-CM | POA: Diagnosis not present

## 2011-10-29 DIAGNOSIS — E119 Type 2 diabetes mellitus without complications: Secondary | ICD-10-CM | POA: Diagnosis not present

## 2011-10-29 DIAGNOSIS — I10 Essential (primary) hypertension: Secondary | ICD-10-CM | POA: Diagnosis not present

## 2011-10-30 DIAGNOSIS — I10 Essential (primary) hypertension: Secondary | ICD-10-CM | POA: Diagnosis not present

## 2011-10-30 DIAGNOSIS — S72009D Fracture of unspecified part of neck of unspecified femur, subsequent encounter for closed fracture with routine healing: Secondary | ICD-10-CM | POA: Diagnosis not present

## 2011-10-30 DIAGNOSIS — E119 Type 2 diabetes mellitus without complications: Secondary | ICD-10-CM | POA: Diagnosis not present

## 2011-10-30 DIAGNOSIS — IMO0001 Reserved for inherently not codable concepts without codable children: Secondary | ICD-10-CM | POA: Diagnosis not present

## 2011-10-30 DIAGNOSIS — D62 Acute posthemorrhagic anemia: Secondary | ICD-10-CM | POA: Diagnosis not present

## 2011-10-31 DIAGNOSIS — R634 Abnormal weight loss: Secondary | ICD-10-CM | POA: Diagnosis not present

## 2011-10-31 DIAGNOSIS — K449 Diaphragmatic hernia without obstruction or gangrene: Secondary | ICD-10-CM | POA: Diagnosis not present

## 2011-10-31 DIAGNOSIS — R1084 Generalized abdominal pain: Secondary | ICD-10-CM | POA: Diagnosis not present

## 2011-11-01 DIAGNOSIS — E119 Type 2 diabetes mellitus without complications: Secondary | ICD-10-CM | POA: Diagnosis not present

## 2011-11-01 DIAGNOSIS — IMO0001 Reserved for inherently not codable concepts without codable children: Secondary | ICD-10-CM | POA: Diagnosis not present

## 2011-11-01 DIAGNOSIS — D62 Acute posthemorrhagic anemia: Secondary | ICD-10-CM | POA: Diagnosis not present

## 2011-11-01 DIAGNOSIS — I10 Essential (primary) hypertension: Secondary | ICD-10-CM | POA: Diagnosis not present

## 2011-11-01 DIAGNOSIS — S72009D Fracture of unspecified part of neck of unspecified femur, subsequent encounter for closed fracture with routine healing: Secondary | ICD-10-CM | POA: Diagnosis not present

## 2011-11-05 DIAGNOSIS — E119 Type 2 diabetes mellitus without complications: Secondary | ICD-10-CM | POA: Diagnosis not present

## 2011-11-05 DIAGNOSIS — I10 Essential (primary) hypertension: Secondary | ICD-10-CM | POA: Diagnosis not present

## 2011-11-05 DIAGNOSIS — IMO0001 Reserved for inherently not codable concepts without codable children: Secondary | ICD-10-CM | POA: Diagnosis not present

## 2011-11-05 DIAGNOSIS — D62 Acute posthemorrhagic anemia: Secondary | ICD-10-CM | POA: Diagnosis not present

## 2011-11-05 DIAGNOSIS — S72009D Fracture of unspecified part of neck of unspecified femur, subsequent encounter for closed fracture with routine healing: Secondary | ICD-10-CM | POA: Diagnosis not present

## 2011-11-06 DIAGNOSIS — S72009D Fracture of unspecified part of neck of unspecified femur, subsequent encounter for closed fracture with routine healing: Secondary | ICD-10-CM | POA: Diagnosis not present

## 2011-11-06 DIAGNOSIS — IMO0001 Reserved for inherently not codable concepts without codable children: Secondary | ICD-10-CM | POA: Diagnosis not present

## 2011-11-06 DIAGNOSIS — D62 Acute posthemorrhagic anemia: Secondary | ICD-10-CM | POA: Diagnosis not present

## 2011-11-06 DIAGNOSIS — E119 Type 2 diabetes mellitus without complications: Secondary | ICD-10-CM | POA: Diagnosis not present

## 2011-11-06 DIAGNOSIS — I10 Essential (primary) hypertension: Secondary | ICD-10-CM | POA: Diagnosis not present

## 2011-11-07 DIAGNOSIS — IMO0001 Reserved for inherently not codable concepts without codable children: Secondary | ICD-10-CM | POA: Diagnosis not present

## 2011-11-07 DIAGNOSIS — E119 Type 2 diabetes mellitus without complications: Secondary | ICD-10-CM | POA: Diagnosis not present

## 2011-11-07 DIAGNOSIS — S72009D Fracture of unspecified part of neck of unspecified femur, subsequent encounter for closed fracture with routine healing: Secondary | ICD-10-CM | POA: Diagnosis not present

## 2011-11-07 DIAGNOSIS — D62 Acute posthemorrhagic anemia: Secondary | ICD-10-CM | POA: Diagnosis not present

## 2011-11-07 DIAGNOSIS — I10 Essential (primary) hypertension: Secondary | ICD-10-CM | POA: Diagnosis not present

## 2011-11-08 DIAGNOSIS — I10 Essential (primary) hypertension: Secondary | ICD-10-CM | POA: Diagnosis not present

## 2011-11-08 DIAGNOSIS — D62 Acute posthemorrhagic anemia: Secondary | ICD-10-CM | POA: Diagnosis not present

## 2011-11-08 DIAGNOSIS — E119 Type 2 diabetes mellitus without complications: Secondary | ICD-10-CM | POA: Diagnosis not present

## 2011-11-08 DIAGNOSIS — IMO0001 Reserved for inherently not codable concepts without codable children: Secondary | ICD-10-CM | POA: Diagnosis not present

## 2011-11-08 DIAGNOSIS — S72009D Fracture of unspecified part of neck of unspecified femur, subsequent encounter for closed fracture with routine healing: Secondary | ICD-10-CM | POA: Diagnosis not present

## 2011-11-11 DIAGNOSIS — S72009D Fracture of unspecified part of neck of unspecified femur, subsequent encounter for closed fracture with routine healing: Secondary | ICD-10-CM | POA: Diagnosis not present

## 2011-11-11 DIAGNOSIS — D62 Acute posthemorrhagic anemia: Secondary | ICD-10-CM | POA: Diagnosis not present

## 2011-11-11 DIAGNOSIS — E119 Type 2 diabetes mellitus without complications: Secondary | ICD-10-CM | POA: Diagnosis not present

## 2011-11-11 DIAGNOSIS — IMO0001 Reserved for inherently not codable concepts without codable children: Secondary | ICD-10-CM | POA: Diagnosis not present

## 2011-11-11 DIAGNOSIS — I10 Essential (primary) hypertension: Secondary | ICD-10-CM | POA: Diagnosis not present

## 2011-11-12 DIAGNOSIS — D62 Acute posthemorrhagic anemia: Secondary | ICD-10-CM | POA: Diagnosis not present

## 2011-11-12 DIAGNOSIS — S72009D Fracture of unspecified part of neck of unspecified femur, subsequent encounter for closed fracture with routine healing: Secondary | ICD-10-CM | POA: Diagnosis not present

## 2011-11-12 DIAGNOSIS — I10 Essential (primary) hypertension: Secondary | ICD-10-CM | POA: Diagnosis not present

## 2011-11-12 DIAGNOSIS — IMO0001 Reserved for inherently not codable concepts without codable children: Secondary | ICD-10-CM | POA: Diagnosis not present

## 2011-11-12 DIAGNOSIS — E119 Type 2 diabetes mellitus without complications: Secondary | ICD-10-CM | POA: Diagnosis not present

## 2011-11-13 DIAGNOSIS — D62 Acute posthemorrhagic anemia: Secondary | ICD-10-CM | POA: Diagnosis not present

## 2011-11-13 DIAGNOSIS — I10 Essential (primary) hypertension: Secondary | ICD-10-CM | POA: Diagnosis not present

## 2011-11-13 DIAGNOSIS — IMO0001 Reserved for inherently not codable concepts without codable children: Secondary | ICD-10-CM | POA: Diagnosis not present

## 2011-11-13 DIAGNOSIS — E119 Type 2 diabetes mellitus without complications: Secondary | ICD-10-CM | POA: Diagnosis not present

## 2011-11-13 DIAGNOSIS — S72009D Fracture of unspecified part of neck of unspecified femur, subsequent encounter for closed fracture with routine healing: Secondary | ICD-10-CM | POA: Diagnosis not present

## 2011-11-14 DIAGNOSIS — E119 Type 2 diabetes mellitus without complications: Secondary | ICD-10-CM | POA: Diagnosis not present

## 2011-11-14 DIAGNOSIS — I10 Essential (primary) hypertension: Secondary | ICD-10-CM | POA: Diagnosis not present

## 2011-11-14 DIAGNOSIS — K2100 Gastro-esophageal reflux disease with esophagitis, without bleeding: Secondary | ICD-10-CM | POA: Diagnosis not present

## 2011-11-14 DIAGNOSIS — Z23 Encounter for immunization: Secondary | ICD-10-CM | POA: Diagnosis not present

## 2011-11-15 DIAGNOSIS — I10 Essential (primary) hypertension: Secondary | ICD-10-CM | POA: Diagnosis not present

## 2011-11-15 DIAGNOSIS — S72009D Fracture of unspecified part of neck of unspecified femur, subsequent encounter for closed fracture with routine healing: Secondary | ICD-10-CM | POA: Diagnosis not present

## 2011-11-15 DIAGNOSIS — E119 Type 2 diabetes mellitus without complications: Secondary | ICD-10-CM | POA: Diagnosis not present

## 2011-11-15 DIAGNOSIS — D62 Acute posthemorrhagic anemia: Secondary | ICD-10-CM | POA: Diagnosis not present

## 2011-11-15 DIAGNOSIS — IMO0001 Reserved for inherently not codable concepts without codable children: Secondary | ICD-10-CM | POA: Diagnosis not present

## 2011-11-18 DIAGNOSIS — I10 Essential (primary) hypertension: Secondary | ICD-10-CM | POA: Diagnosis not present

## 2011-11-18 DIAGNOSIS — S72009D Fracture of unspecified part of neck of unspecified femur, subsequent encounter for closed fracture with routine healing: Secondary | ICD-10-CM | POA: Diagnosis not present

## 2011-11-18 DIAGNOSIS — IMO0001 Reserved for inherently not codable concepts without codable children: Secondary | ICD-10-CM | POA: Diagnosis not present

## 2011-11-18 DIAGNOSIS — D62 Acute posthemorrhagic anemia: Secondary | ICD-10-CM | POA: Diagnosis not present

## 2011-11-18 DIAGNOSIS — E119 Type 2 diabetes mellitus without complications: Secondary | ICD-10-CM | POA: Diagnosis not present

## 2011-11-19 DIAGNOSIS — D62 Acute posthemorrhagic anemia: Secondary | ICD-10-CM | POA: Diagnosis not present

## 2011-11-19 DIAGNOSIS — S72009D Fracture of unspecified part of neck of unspecified femur, subsequent encounter for closed fracture with routine healing: Secondary | ICD-10-CM | POA: Diagnosis not present

## 2011-11-19 DIAGNOSIS — IMO0001 Reserved for inherently not codable concepts without codable children: Secondary | ICD-10-CM | POA: Diagnosis not present

## 2011-11-19 DIAGNOSIS — I10 Essential (primary) hypertension: Secondary | ICD-10-CM | POA: Diagnosis not present

## 2011-11-19 DIAGNOSIS — E119 Type 2 diabetes mellitus without complications: Secondary | ICD-10-CM | POA: Diagnosis not present

## 2011-11-21 DIAGNOSIS — S72009D Fracture of unspecified part of neck of unspecified femur, subsequent encounter for closed fracture with routine healing: Secondary | ICD-10-CM | POA: Diagnosis not present

## 2011-11-21 DIAGNOSIS — D62 Acute posthemorrhagic anemia: Secondary | ICD-10-CM | POA: Diagnosis not present

## 2011-11-21 DIAGNOSIS — E119 Type 2 diabetes mellitus without complications: Secondary | ICD-10-CM | POA: Diagnosis not present

## 2011-11-21 DIAGNOSIS — IMO0001 Reserved for inherently not codable concepts without codable children: Secondary | ICD-10-CM | POA: Diagnosis not present

## 2011-11-21 DIAGNOSIS — I10 Essential (primary) hypertension: Secondary | ICD-10-CM | POA: Diagnosis not present

## 2011-11-22 DIAGNOSIS — E119 Type 2 diabetes mellitus without complications: Secondary | ICD-10-CM | POA: Diagnosis not present

## 2011-11-22 DIAGNOSIS — IMO0001 Reserved for inherently not codable concepts without codable children: Secondary | ICD-10-CM | POA: Diagnosis not present

## 2011-11-22 DIAGNOSIS — S72009D Fracture of unspecified part of neck of unspecified femur, subsequent encounter for closed fracture with routine healing: Secondary | ICD-10-CM | POA: Diagnosis not present

## 2011-11-22 DIAGNOSIS — I10 Essential (primary) hypertension: Secondary | ICD-10-CM | POA: Diagnosis not present

## 2011-11-22 DIAGNOSIS — D62 Acute posthemorrhagic anemia: Secondary | ICD-10-CM | POA: Diagnosis not present

## 2011-11-25 DIAGNOSIS — S72009D Fracture of unspecified part of neck of unspecified femur, subsequent encounter for closed fracture with routine healing: Secondary | ICD-10-CM | POA: Diagnosis not present

## 2011-11-25 DIAGNOSIS — I10 Essential (primary) hypertension: Secondary | ICD-10-CM | POA: Diagnosis not present

## 2011-11-25 DIAGNOSIS — E119 Type 2 diabetes mellitus without complications: Secondary | ICD-10-CM | POA: Diagnosis not present

## 2011-11-25 DIAGNOSIS — IMO0001 Reserved for inherently not codable concepts without codable children: Secondary | ICD-10-CM | POA: Diagnosis not present

## 2011-11-25 DIAGNOSIS — D62 Acute posthemorrhagic anemia: Secondary | ICD-10-CM | POA: Diagnosis not present

## 2011-11-26 DIAGNOSIS — E119 Type 2 diabetes mellitus without complications: Secondary | ICD-10-CM | POA: Diagnosis not present

## 2011-11-26 DIAGNOSIS — IMO0001 Reserved for inherently not codable concepts without codable children: Secondary | ICD-10-CM | POA: Diagnosis not present

## 2011-11-26 DIAGNOSIS — S72009D Fracture of unspecified part of neck of unspecified femur, subsequent encounter for closed fracture with routine healing: Secondary | ICD-10-CM | POA: Diagnosis not present

## 2011-11-26 DIAGNOSIS — I10 Essential (primary) hypertension: Secondary | ICD-10-CM | POA: Diagnosis not present

## 2011-11-26 DIAGNOSIS — D62 Acute posthemorrhagic anemia: Secondary | ICD-10-CM | POA: Diagnosis not present

## 2011-11-27 DIAGNOSIS — E119 Type 2 diabetes mellitus without complications: Secondary | ICD-10-CM | POA: Diagnosis not present

## 2011-11-27 DIAGNOSIS — S72009D Fracture of unspecified part of neck of unspecified femur, subsequent encounter for closed fracture with routine healing: Secondary | ICD-10-CM | POA: Diagnosis not present

## 2011-11-27 DIAGNOSIS — I10 Essential (primary) hypertension: Secondary | ICD-10-CM | POA: Diagnosis not present

## 2011-11-27 DIAGNOSIS — IMO0001 Reserved for inherently not codable concepts without codable children: Secondary | ICD-10-CM | POA: Diagnosis not present

## 2011-11-27 DIAGNOSIS — D62 Acute posthemorrhagic anemia: Secondary | ICD-10-CM | POA: Diagnosis not present

## 2011-11-28 DIAGNOSIS — S72009D Fracture of unspecified part of neck of unspecified femur, subsequent encounter for closed fracture with routine healing: Secondary | ICD-10-CM | POA: Diagnosis not present

## 2011-11-28 DIAGNOSIS — IMO0001 Reserved for inherently not codable concepts without codable children: Secondary | ICD-10-CM | POA: Diagnosis not present

## 2011-11-28 DIAGNOSIS — I10 Essential (primary) hypertension: Secondary | ICD-10-CM | POA: Diagnosis not present

## 2011-11-28 DIAGNOSIS — E119 Type 2 diabetes mellitus without complications: Secondary | ICD-10-CM | POA: Diagnosis not present

## 2011-11-28 DIAGNOSIS — D62 Acute posthemorrhagic anemia: Secondary | ICD-10-CM | POA: Diagnosis not present

## 2011-11-29 DIAGNOSIS — IMO0001 Reserved for inherently not codable concepts without codable children: Secondary | ICD-10-CM | POA: Diagnosis not present

## 2011-11-29 DIAGNOSIS — D62 Acute posthemorrhagic anemia: Secondary | ICD-10-CM | POA: Diagnosis not present

## 2011-11-29 DIAGNOSIS — E119 Type 2 diabetes mellitus without complications: Secondary | ICD-10-CM | POA: Diagnosis not present

## 2011-11-29 DIAGNOSIS — I10 Essential (primary) hypertension: Secondary | ICD-10-CM | POA: Diagnosis not present

## 2011-11-29 DIAGNOSIS — S72009D Fracture of unspecified part of neck of unspecified femur, subsequent encounter for closed fracture with routine healing: Secondary | ICD-10-CM | POA: Diagnosis not present

## 2011-12-02 DIAGNOSIS — IMO0001 Reserved for inherently not codable concepts without codable children: Secondary | ICD-10-CM | POA: Diagnosis not present

## 2011-12-02 DIAGNOSIS — E119 Type 2 diabetes mellitus without complications: Secondary | ICD-10-CM | POA: Diagnosis not present

## 2011-12-02 DIAGNOSIS — D62 Acute posthemorrhagic anemia: Secondary | ICD-10-CM | POA: Diagnosis not present

## 2011-12-02 DIAGNOSIS — I10 Essential (primary) hypertension: Secondary | ICD-10-CM | POA: Diagnosis not present

## 2011-12-02 DIAGNOSIS — S72009D Fracture of unspecified part of neck of unspecified femur, subsequent encounter for closed fracture with routine healing: Secondary | ICD-10-CM | POA: Diagnosis not present

## 2011-12-04 DIAGNOSIS — I10 Essential (primary) hypertension: Secondary | ICD-10-CM | POA: Diagnosis not present

## 2011-12-04 DIAGNOSIS — E119 Type 2 diabetes mellitus without complications: Secondary | ICD-10-CM | POA: Diagnosis not present

## 2011-12-04 DIAGNOSIS — IMO0001 Reserved for inherently not codable concepts without codable children: Secondary | ICD-10-CM | POA: Diagnosis not present

## 2011-12-04 DIAGNOSIS — S72009D Fracture of unspecified part of neck of unspecified femur, subsequent encounter for closed fracture with routine healing: Secondary | ICD-10-CM | POA: Diagnosis not present

## 2011-12-04 DIAGNOSIS — D62 Acute posthemorrhagic anemia: Secondary | ICD-10-CM | POA: Diagnosis not present

## 2011-12-05 DIAGNOSIS — E119 Type 2 diabetes mellitus without complications: Secondary | ICD-10-CM | POA: Diagnosis not present

## 2011-12-05 DIAGNOSIS — S72009D Fracture of unspecified part of neck of unspecified femur, subsequent encounter for closed fracture with routine healing: Secondary | ICD-10-CM | POA: Diagnosis not present

## 2011-12-05 DIAGNOSIS — D62 Acute posthemorrhagic anemia: Secondary | ICD-10-CM | POA: Diagnosis not present

## 2011-12-05 DIAGNOSIS — I10 Essential (primary) hypertension: Secondary | ICD-10-CM | POA: Diagnosis not present

## 2011-12-05 DIAGNOSIS — IMO0001 Reserved for inherently not codable concepts without codable children: Secondary | ICD-10-CM | POA: Diagnosis not present

## 2011-12-06 DIAGNOSIS — I10 Essential (primary) hypertension: Secondary | ICD-10-CM | POA: Diagnosis not present

## 2011-12-06 DIAGNOSIS — E119 Type 2 diabetes mellitus without complications: Secondary | ICD-10-CM | POA: Diagnosis not present

## 2011-12-06 DIAGNOSIS — D62 Acute posthemorrhagic anemia: Secondary | ICD-10-CM | POA: Diagnosis not present

## 2011-12-06 DIAGNOSIS — IMO0001 Reserved for inherently not codable concepts without codable children: Secondary | ICD-10-CM | POA: Diagnosis not present

## 2011-12-06 DIAGNOSIS — S72009D Fracture of unspecified part of neck of unspecified femur, subsequent encounter for closed fracture with routine healing: Secondary | ICD-10-CM | POA: Diagnosis not present

## 2011-12-11 DIAGNOSIS — B351 Tinea unguium: Secondary | ICD-10-CM | POA: Diagnosis not present

## 2011-12-11 DIAGNOSIS — M79609 Pain in unspecified limb: Secondary | ICD-10-CM | POA: Diagnosis not present

## 2011-12-12 DIAGNOSIS — E119 Type 2 diabetes mellitus without complications: Secondary | ICD-10-CM | POA: Diagnosis not present

## 2011-12-12 DIAGNOSIS — E559 Vitamin D deficiency, unspecified: Secondary | ICD-10-CM | POA: Diagnosis not present

## 2011-12-12 DIAGNOSIS — E039 Hypothyroidism, unspecified: Secondary | ICD-10-CM | POA: Diagnosis not present

## 2011-12-12 DIAGNOSIS — E785 Hyperlipidemia, unspecified: Secondary | ICD-10-CM | POA: Diagnosis not present

## 2011-12-13 DIAGNOSIS — IMO0001 Reserved for inherently not codable concepts without codable children: Secondary | ICD-10-CM | POA: Diagnosis not present

## 2011-12-13 DIAGNOSIS — D62 Acute posthemorrhagic anemia: Secondary | ICD-10-CM | POA: Diagnosis not present

## 2011-12-13 DIAGNOSIS — I10 Essential (primary) hypertension: Secondary | ICD-10-CM | POA: Diagnosis not present

## 2011-12-13 DIAGNOSIS — E119 Type 2 diabetes mellitus without complications: Secondary | ICD-10-CM | POA: Diagnosis not present

## 2011-12-13 DIAGNOSIS — S72009D Fracture of unspecified part of neck of unspecified femur, subsequent encounter for closed fracture with routine healing: Secondary | ICD-10-CM | POA: Diagnosis not present

## 2011-12-18 DIAGNOSIS — E039 Hypothyroidism, unspecified: Secondary | ICD-10-CM | POA: Diagnosis not present

## 2011-12-18 DIAGNOSIS — I10 Essential (primary) hypertension: Secondary | ICD-10-CM | POA: Diagnosis not present

## 2011-12-18 DIAGNOSIS — E785 Hyperlipidemia, unspecified: Secondary | ICD-10-CM | POA: Diagnosis not present

## 2011-12-18 DIAGNOSIS — E119 Type 2 diabetes mellitus without complications: Secondary | ICD-10-CM | POA: Diagnosis not present

## 2012-01-22 DIAGNOSIS — S7290XA Unspecified fracture of unspecified femur, initial encounter for closed fracture: Secondary | ICD-10-CM | POA: Diagnosis not present

## 2012-01-28 DIAGNOSIS — M25569 Pain in unspecified knee: Secondary | ICD-10-CM | POA: Diagnosis not present

## 2012-02-07 DIAGNOSIS — M25569 Pain in unspecified knee: Secondary | ICD-10-CM | POA: Diagnosis not present

## 2012-02-11 DIAGNOSIS — M25569 Pain in unspecified knee: Secondary | ICD-10-CM | POA: Diagnosis not present

## 2012-02-14 DIAGNOSIS — M25569 Pain in unspecified knee: Secondary | ICD-10-CM | POA: Diagnosis not present

## 2012-02-18 DIAGNOSIS — M25569 Pain in unspecified knee: Secondary | ICD-10-CM | POA: Diagnosis not present

## 2012-02-20 DIAGNOSIS — M25569 Pain in unspecified knee: Secondary | ICD-10-CM | POA: Diagnosis not present

## 2012-02-24 DIAGNOSIS — M25569 Pain in unspecified knee: Secondary | ICD-10-CM | POA: Diagnosis not present

## 2012-02-27 DIAGNOSIS — M25569 Pain in unspecified knee: Secondary | ICD-10-CM | POA: Diagnosis not present

## 2012-03-06 DIAGNOSIS — M79609 Pain in unspecified limb: Secondary | ICD-10-CM | POA: Diagnosis not present

## 2012-03-06 DIAGNOSIS — B351 Tinea unguium: Secondary | ICD-10-CM | POA: Diagnosis not present

## 2012-03-11 DIAGNOSIS — I10 Essential (primary) hypertension: Secondary | ICD-10-CM | POA: Diagnosis not present

## 2012-03-11 DIAGNOSIS — E039 Hypothyroidism, unspecified: Secondary | ICD-10-CM | POA: Diagnosis not present

## 2012-03-11 DIAGNOSIS — E785 Hyperlipidemia, unspecified: Secondary | ICD-10-CM | POA: Diagnosis not present

## 2012-03-11 DIAGNOSIS — E119 Type 2 diabetes mellitus without complications: Secondary | ICD-10-CM | POA: Diagnosis not present

## 2012-04-28 DIAGNOSIS — I10 Essential (primary) hypertension: Secondary | ICD-10-CM | POA: Diagnosis not present

## 2012-04-28 DIAGNOSIS — IMO0002 Reserved for concepts with insufficient information to code with codable children: Secondary | ICD-10-CM | POA: Diagnosis not present

## 2012-04-28 DIAGNOSIS — K2100 Gastro-esophageal reflux disease with esophagitis, without bleeding: Secondary | ICD-10-CM | POA: Diagnosis not present

## 2012-04-28 DIAGNOSIS — M1990 Unspecified osteoarthritis, unspecified site: Secondary | ICD-10-CM | POA: Diagnosis not present

## 2012-04-28 DIAGNOSIS — M1710 Unilateral primary osteoarthritis, unspecified knee: Secondary | ICD-10-CM | POA: Diagnosis not present

## 2012-05-28 ENCOUNTER — Other Ambulatory Visit: Payer: Self-pay | Admitting: *Deleted

## 2012-05-28 DIAGNOSIS — E785 Hyperlipidemia, unspecified: Secondary | ICD-10-CM

## 2012-05-28 DIAGNOSIS — E039 Hypothyroidism, unspecified: Secondary | ICD-10-CM

## 2012-05-28 DIAGNOSIS — E119 Type 2 diabetes mellitus without complications: Secondary | ICD-10-CM

## 2012-06-05 ENCOUNTER — Other Ambulatory Visit: Payer: Medicare Other

## 2012-06-05 DIAGNOSIS — E039 Hypothyroidism, unspecified: Secondary | ICD-10-CM

## 2012-06-05 DIAGNOSIS — E1149 Type 2 diabetes mellitus with other diabetic neurological complication: Secondary | ICD-10-CM | POA: Diagnosis not present

## 2012-06-05 DIAGNOSIS — E119 Type 2 diabetes mellitus without complications: Secondary | ICD-10-CM | POA: Diagnosis not present

## 2012-06-05 DIAGNOSIS — E785 Hyperlipidemia, unspecified: Secondary | ICD-10-CM | POA: Diagnosis not present

## 2012-06-05 DIAGNOSIS — B351 Tinea unguium: Secondary | ICD-10-CM | POA: Diagnosis not present

## 2012-06-05 DIAGNOSIS — Q828 Other specified congenital malformations of skin: Secondary | ICD-10-CM | POA: Diagnosis not present

## 2012-06-06 LAB — BASIC METABOLIC PANEL
BUN/Creatinine Ratio: 27 — ABNORMAL HIGH (ref 11–26)
BUN: 19 mg/dL (ref 8–27)
CO2: 29 mmol/L — ABNORMAL HIGH (ref 19–28)
Calcium: 9.5 mg/dL (ref 8.6–10.2)
Chloride: 101 mmol/L (ref 97–108)
Creatinine, Ser: 0.71 mg/dL (ref 0.57–1.00)
Sodium: 142 mmol/L (ref 134–144)

## 2012-06-06 LAB — HEMOGLOBIN A1C: Est. average glucose Bld gHb Est-mCnc: 128 mg/dL

## 2012-06-09 ENCOUNTER — Ambulatory Visit (INDEPENDENT_AMBULATORY_CARE_PROVIDER_SITE_OTHER): Payer: Medicare Other | Admitting: Internal Medicine

## 2012-06-09 ENCOUNTER — Encounter: Payer: Self-pay | Admitting: Internal Medicine

## 2012-06-09 VITALS — BP 146/82 | HR 71 | Temp 97.6°F | Resp 16 | Ht <= 58 in | Wt 149.6 lb

## 2012-06-09 DIAGNOSIS — E1059 Type 1 diabetes mellitus with other circulatory complications: Secondary | ICD-10-CM | POA: Diagnosis not present

## 2012-06-09 DIAGNOSIS — K219 Gastro-esophageal reflux disease without esophagitis: Secondary | ICD-10-CM

## 2012-06-09 DIAGNOSIS — E1159 Type 2 diabetes mellitus with other circulatory complications: Secondary | ICD-10-CM

## 2012-06-09 DIAGNOSIS — R32 Unspecified urinary incontinence: Secondary | ICD-10-CM

## 2012-06-09 DIAGNOSIS — E785 Hyperlipidemia, unspecified: Secondary | ICD-10-CM | POA: Diagnosis not present

## 2012-06-09 DIAGNOSIS — I10 Essential (primary) hypertension: Secondary | ICD-10-CM

## 2012-06-09 DIAGNOSIS — E039 Hypothyroidism, unspecified: Secondary | ICD-10-CM

## 2012-06-09 DIAGNOSIS — R609 Edema, unspecified: Secondary | ICD-10-CM

## 2012-06-09 DIAGNOSIS — R269 Unspecified abnormalities of gait and mobility: Secondary | ICD-10-CM

## 2012-06-09 NOTE — Patient Instructions (Signed)
Continue  Medications on the current list.

## 2012-06-25 DIAGNOSIS — E119 Type 2 diabetes mellitus without complications: Secondary | ICD-10-CM | POA: Diagnosis not present

## 2012-07-15 ENCOUNTER — Other Ambulatory Visit: Payer: Self-pay | Admitting: Geriatric Medicine

## 2012-07-15 MED ORDER — TOLTERODINE TARTRATE ER 4 MG PO CP24: 4.0000 mg | ORAL_CAPSULE | Freq: Every day | ORAL | Status: DC

## 2012-07-15 MED ORDER — PANTOPRAZOLE SODIUM 40 MG PO TBEC: 40.0000 mg | DELAYED_RELEASE_TABLET | Freq: Every day | ORAL | Status: DC

## 2012-07-15 MED ORDER — LOSARTAN POTASSIUM 50 MG PO TABS: 50.0000 mg | ORAL_TABLET | Freq: Every day | ORAL | Status: DC

## 2012-07-15 MED ORDER — LEVOTHYROXINE SODIUM 25 MCG PO TABS: 25.0000 ug | ORAL_TABLET | Freq: Every day | ORAL | Status: DC

## 2012-07-23 DIAGNOSIS — R269 Unspecified abnormalities of gait and mobility: Secondary | ICD-10-CM | POA: Insufficient documentation

## 2012-07-23 DIAGNOSIS — E1059 Type 1 diabetes mellitus with other circulatory complications: Secondary | ICD-10-CM | POA: Insufficient documentation

## 2012-07-23 DIAGNOSIS — R32 Unspecified urinary incontinence: Secondary | ICD-10-CM | POA: Insufficient documentation

## 2012-07-23 DIAGNOSIS — E1151 Type 2 diabetes mellitus with diabetic peripheral angiopathy without gangrene: Secondary | ICD-10-CM | POA: Insufficient documentation

## 2012-07-23 DIAGNOSIS — E785 Hyperlipidemia, unspecified: Secondary | ICD-10-CM | POA: Insufficient documentation

## 2012-07-23 DIAGNOSIS — E119 Type 2 diabetes mellitus without complications: Secondary | ICD-10-CM | POA: Insufficient documentation

## 2012-07-23 DIAGNOSIS — R609 Edema, unspecified: Secondary | ICD-10-CM | POA: Insufficient documentation

## 2012-07-23 DIAGNOSIS — E1159 Type 2 diabetes mellitus with other circulatory complications: Secondary | ICD-10-CM | POA: Insufficient documentation

## 2012-07-23 DIAGNOSIS — E1051 Type 1 diabetes mellitus with diabetic peripheral angiopathy without gangrene: Secondary | ICD-10-CM | POA: Insufficient documentation

## 2012-07-23 NOTE — Progress Notes (Signed)
Subjective:    Patient ID: Jessica Hawkins, female    DOB: [birth year], 79 y.o.   MRN: [id number]  HPI 1. Type II or unspecified type diabetes mellitus with peripheral circulatory disorders, not stated as uncontrolled(250.70) : controlled  2. Other and unspecified hyperlipidemia : controlled  3. GERD (gastroesophageal reflux disease) : impproved  4. Edema : chronic and unchanged at 3+ bilaterally  5. Abnormality of gait : remains a fall risk. Using walker.  6. Unspecified urinary incontinence : dribbling and stress incontinence  7. Hypothyroid : controlled  8. HTN (hypertension) : controlled  9.       Review of Systems  Constitutional: Negative for fever, chills, activity change, appetite change, fatigue and unexpected weight change.  HENT: Negative.   Eyes: Negative.   Respiratory: Negative.  Negative for cough and shortness of breath.   Cardiovascular: Positive for leg swelling. Negative for chest pain and palpitations.  Gastrointestinal: Negative for vomiting, diarrhea and abdominal distention.  Endocrine: Negative for cold intolerance, heat intolerance, polydipsia, polyphagia and polyuria.  Genitourinary:       Incontinent  Musculoskeletal: Positive for myalgias, back pain, joint swelling, arthralgias and gait problem.  Skin: Negative.   Neurological: Negative.   Hematological: Negative.   Psychiatric/Behavioral: Negative.        Objective: BP 146/82  Pulse 71  Temp(Src) 97.6 F (36.4 C) (Oral)  Resp 16  Ht 4\' 4"  (1.321 m)  Wt 149 lb 9.6 oz (67.858 kg)  BMI 38.89 kg/m2    Physical Exam  Constitutional: She is oriented to person, place, and time. She appears well-developed. No distress.  frail  HENT:  Head: Normocephalic and atraumatic.  Left Ear: External ear normal.  Nose: Nose normal.  Eyes: Conjunctivae and EOM are normal. Pupils are equal, round, and reactive to light.  Neck: Normal range of motion. Neck supple. No JVD present. No tracheal deviation  present. No thyromegaly present.  Cardiovascular: Normal rate, regular rhythm, normal heart sounds and intact distal pulses.  Exam reveals no gallop and no friction rub.   No murmur heard. Varicose veins.  Pulmonary/Chest: No respiratory distress. She has no wheezes. She has no rales.  Abdominal: She exhibits no distension and no mass. There is no tenderness.  Musculoskeletal: She exhibits edema and tenderness.  Poor balance. Using walker.  Lymphadenopathy:    She has no cervical adenopathy.  Neurological: She is alert and oriented to person, place, and time. No cranial nerve deficit. Coordination normal.  Skin: No rash noted. No erythema. No pallor.  Seborrheic keratoses  Psychiatric: She has a normal mood and affect. Her behavior is normal. Thought content normal.    Lab reports Appointment on 06/05/2012  Component Date Value Range Status  . Glucose 06/05/2012 94  65 - 99 mg/dL Final  . BUN 40/98/1191 19  8 - 27 mg/dL Final  . Creatinine, Ser 06/05/2012 0.71  0.57 - 1.00 mg/dL Final  . GFR calc non Af Amer 06/05/2012 77  >59 mL/min/1.73 Final  . GFR calc Af Amer 06/05/2012 89  >59 mL/min/1.73 Final  . BUN/Creatinine Ratio 06/05/2012 27* 11 - 26 Final  . Sodium 06/05/2012 142  134 - 144 mmol/L Final  . Potassium 06/05/2012 4.3  3.5 - 5.2 mmol/L Final  . Chloride 06/05/2012 101  97 - 108 mmol/L Final  . CO2 06/05/2012 29* 19 - 28 mmol/L Final  . Calcium 06/05/2012 9.5  8.6 - 10.2 mg/dL Final  . Hemoglobin Y7W 06/05/2012 6.1* 4.8 - 5.6 %  Final   Comment:          Increased risk for diabetes: 5.7 - 6.4                                   Diabetes: >6.4                                   Glycemic control for adults with diabetes: <7.0  . Estimated average glucose 06/05/2012 128   Final       Assessment & Plan:   Other and unspecified hyperlipidemia - Controlled  Plan: Lipid panel  GERD (gastroesophageal reflux disease) improved  Edema Chronic.Unchanged.  Abnormality of  gait:  Chronic. Continue to exercise and use walker.  Unspecified urinary incontinence; Offered urology referral, but she does not desire at this time.  Hypothyroid controlled  HTN (hypertension) controlled  Type II or unspecified type diabetes mellitus with peripheral circulatory disorders, not stated as uncontrolled(250.70) Controlled Plan: Hemoglobin A1c, Basic Metabolic Panel

## 2012-08-24 ENCOUNTER — Encounter: Payer: Self-pay | Admitting: Internal Medicine

## 2012-08-24 ENCOUNTER — Telehealth: Payer: Self-pay | Admitting: *Deleted

## 2012-08-24 ENCOUNTER — Ambulatory Visit (INDEPENDENT_AMBULATORY_CARE_PROVIDER_SITE_OTHER): Payer: Medicare Other | Admitting: Internal Medicine

## 2012-08-24 VITALS — BP 210/70 | HR 80 | Temp 98.0°F | Resp 20 | Ht <= 58 in | Wt 155.0 lb

## 2012-08-24 DIAGNOSIS — M62838 Other muscle spasm: Secondary | ICD-10-CM

## 2012-08-24 DIAGNOSIS — G8929 Other chronic pain: Secondary | ICD-10-CM

## 2012-08-24 DIAGNOSIS — R1031 Right lower quadrant pain: Secondary | ICD-10-CM | POA: Diagnosis not present

## 2012-08-24 NOTE — Telephone Encounter (Signed)
Patient called and stated that she has pain in her groin area on the right side. York Spaniel it was feeling better with Tylenol and wanted to cancel her appointment. Told her that she needed to be seen for the pain. Patient stated that it was feeling better and that she would just take Tylenol that Dr. Chilton Si prescribed before. I told her to keep her appointment and be evaluated and she stated that she would keep it for now.

## 2012-08-24 NOTE — Telephone Encounter (Signed)
Jessica Hawkins shows up at the office,son dropping her off and leaving, stating that she is still having lower groin right sided pain. Patient took two more Tylenol with no relief. Said her daughter would not be here to pick her up until around 5:00pm. Dr. Renato Gails agreed to see patient. Patient made aware it Colbe Viviano be alittle while before she sees her. Patient agreed.

## 2012-08-24 NOTE — Progress Notes (Signed)
Patient ID: Jessica Hawkins, female   DOB: 14-May-1922, 79 y.o.   MRN: [id number] Location:  [facility]  Allergies  Allergen Reactions  . Cabbage     unknown    Chief Complaint  Patient presents with  . Acute Visit    right side pain    HPI: Patient is a 79 y.o. AA female seen in the office today for right side pain.  Hurting since last night and this am.  Took a tylenol this am, but did not help.  Hurts when she sits up.  Had normal BM this am.  Does have to eat apple and plenty of greens to keep moving.  Was to have cscope, but has very curvy bowel so GI was going to sigmoidoscopy.  No dark stool or bloody stool.  No nausea.  Eating regularly--ate less today due to this.  No fever or chills.    Review of Systems:  Review of Systems  Respiratory: Negative for shortness of breath.   Cardiovascular: Negative for chest pain.  Gastrointestinal: Positive for abdominal pain. Negative for heartburn, nausea, vomiting, diarrhea, constipation, blood in stool and melena.  Musculoskeletal:       Hip pain, difficulty ambulating with her rollator     Past Medical History  Diagnosis Date  . Hypertension   . Osteoporosis   . GERD (gastroesophageal reflux disease) 06/29/2011  . Hypothyroid 06/29/2011  . HTN (hypertension) 06/29/2011  . Platelet disorder     ? Von Willebrand  . Hyperlipidemia   . Diabetes mellitus without complication   . Edema   . BBB (bundle branch block)     RT  . Hiatal hernia   . Fibrocystic breast   . Arthritis     Osteoarthritis  . Vertigo   . Insomnia   . Gait disorder     Past Surgical History  Procedure Laterality Date  . Hip surgery Right 2002    ORIF  . Thyroidectomy  1952  . Tonsillectomy    . Femur im nail  06/29/2011    Procedure: INTRAMEDULLARY (IM) NAIL FEMORAL;  Surgeon: Verlee Rossetti, MD;  Location: [facility] OR;  Service: Orthopedics;  Laterality: Left;  . Rotator cuff repair Left 1999    Tear  . Abdominal  hysterectomy    . Eye surgery Right 2005    Cataract surgery  . Eye surgery Left 2006    Cataract surgery    Social History:   reports that she has never smoked. She does not have any smokeless tobacco history on file. She reports that she does not drink alcohol or use illicit drugs.  Family History  Problem Relation Age of Onset  . Cancer Mother     Stomach  . Cancer Father     Prostate  . Emphysema Father   . Alcohol abuse Sister   . Liver disease Sister   . Kidney disease Brother   . Hypertension Daughter   . Cancer Brother     Prostate, Bladder  . Emphysema Brother   . Cancer Brother     Prostate  . Arthritis Daughter     Knees  . Heart murmur Son   . Mental illness Son     Autism    Medications: Patient's Medications  New Prescriptions   No medications on file  Previous Medications   ASPIRIN 81 MG TABLET    Take 81 mg by mouth daily.   CALCIUM CARBONATE PO  Take 1 tablet by mouth daily.   FUROSEMIDE (LASIX) 40 MG TABLET    Take 40 mg by mouth daily. Take one tablet by mouth once daily for edema   LEVOTHYROXINE (SYNTHROID) 25 MCG TABLET    Take 1 tablet (25 mcg total) by mouth daily. Take one tablet by mouth daily for thyroid supplement.   LOSARTAN (COZAAR) 50 MG TABLET    Take 1 tablet (50 mg total) by mouth daily. Take one tablet by mouth once daily for blood pressure   MULTIPLE VITAMINS-MINERALS (CENTRUM PO)    Take 1 tablet by mouth daily.   PANTOPRAZOLE (PROTONIX) 40 MG TABLET    Take 1 tablet (40 mg total) by mouth daily. Take one tablet once daily for stomach   TOLTERODINE (DETROL LA) 4 MG 24 HR CAPSULE    Take 1 capsule (4 mg total) by mouth daily. Take one tablet once daily for bladder  Modified Medications   No medications on file  Discontinued Medications   No medications on file     Physical Exam: Filed Vitals:   08/24/12 1631  BP: 210/70  Pulse: 80  Temp: 98 F (36.7 C)  TempSrc: Oral  Resp: 20  Height: 4\' 4"  (1.321 m)  Weight: 155 lb  (70.308 kg)  SpO2: 98%  Physical Exam  HENT:  Head: Normocephalic and atraumatic.  Cardiovascular: Normal rate and regular rhythm.   Pulmonary/Chest: Effort normal and breath sounds normal.  Abdominal: Soft. Bowel sounds are normal. She exhibits no distension and no mass. There is no tenderness. There is no rebound and no guarding.  Musculoskeletal:  Unable to actively elevate right leg on her own due to pain in right lower quadrant, groin area, and right thigh, but no significant pain when I elevate her leg passively;  Able to elevate left leg w/o a problem  Neurological: She is alert.  Skin: Skin is warm and dry.  Psychiatric: She has a normal mood and affect.    Labs reviewed: Basic Metabolic Panel:  Recent Labs  16/10/96 1004 08/24/12 1648 08/25/12 1619  NA 142 141 136  K 4.3 4.2 4.6  CL 101 100 99  CO2 29* 26 29  GLUCOSE 94 99 126*  BUN 19 18 14   CREATININE 0.71 0.74 0.67  CALCIUM 9.5 9.8 9.7   Lab Results  Component Value Date   HGBA1C 6.1* 06/05/2012   Assessment/Plan No problem-specific assessment & plan notes found for this encounter. 1. Abdominal pain, chronic, right lower quadrant -seems to be musculoskeletal--suspect iliopsoas muscle injury from getting in and out of high hotel bed while traveling with family over the weekend - CMP - CBC with Differential -Use topical medicine for muscle pain right lower quadrant like aspercreme or icy hot. May also (at other times) apply heat to the area.  Told to go to the emergency room if develops blood in stool or worsening pain or fever.      2. Spasm of right side abdominal muscles -as above--has no local tenderness on palpation of the abdomen to suggest internal GI problem  Labs/tests ordered: cbc, cmp Next appt: keep regular appt

## 2012-08-24 NOTE — Patient Instructions (Addendum)
Use topical medicine for your muscle pain in your right lower quadrant like aspercreme or icy hot.  You may also (at other times) apply heat to the area.    Go to the emergency room if you develop blood in your stool or worsening pain or fever.

## 2012-08-25 ENCOUNTER — Encounter (HOSPITAL_COMMUNITY): Payer: Self-pay | Admitting: *Deleted

## 2012-08-25 ENCOUNTER — Emergency Department (HOSPITAL_COMMUNITY)
Admission: EM | Admit: 2012-08-25 | Discharge: 2012-08-25 | Disposition: A | Discharge status: Home / Self Care | Payer: Medicare Other | Attending: Emergency Medicine | Admitting: Emergency Medicine

## 2012-08-25 ENCOUNTER — Emergency Department (INDEPENDENT_AMBULATORY_CARE_PROVIDER_SITE_OTHER)
Admission: EM | Admit: 2012-08-25 | Discharge: 2012-08-25 | Disposition: A | Discharge status: Nursing Home (Includes ICF) | Payer: Medicare Other | Source: Home / Self Care | Attending: Family Medicine | Admitting: Family Medicine

## 2012-08-25 ENCOUNTER — Emergency Department (HOSPITAL_COMMUNITY): Payer: Medicare Other

## 2012-08-25 DIAGNOSIS — R109 Unspecified abdominal pain: Secondary | ICD-10-CM | POA: Diagnosis not present

## 2012-08-25 DIAGNOSIS — Z8742 Personal history of other diseases of the female genital tract: Secondary | ICD-10-CM | POA: Diagnosis not present

## 2012-08-25 DIAGNOSIS — I10 Essential (primary) hypertension: Secondary | ICD-10-CM | POA: Insufficient documentation

## 2012-08-25 DIAGNOSIS — E785 Hyperlipidemia, unspecified: Secondary | ICD-10-CM | POA: Insufficient documentation

## 2012-08-25 DIAGNOSIS — Y929 Unspecified place or not applicable: Secondary | ICD-10-CM | POA: Insufficient documentation

## 2012-08-25 DIAGNOSIS — R110 Nausea: Secondary | ICD-10-CM | POA: Insufficient documentation

## 2012-08-25 DIAGNOSIS — Z8719 Personal history of other diseases of the digestive system: Secondary | ICD-10-CM | POA: Insufficient documentation

## 2012-08-25 DIAGNOSIS — R630 Anorexia: Secondary | ICD-10-CM | POA: Diagnosis not present

## 2012-08-25 DIAGNOSIS — IMO0002 Reserved for concepts with insufficient information to code with codable children: Secondary | ICD-10-CM | POA: Insufficient documentation

## 2012-08-25 DIAGNOSIS — S39011A Strain of muscle, fascia and tendon of abdomen, initial encounter: Secondary | ICD-10-CM

## 2012-08-25 DIAGNOSIS — Z8739 Personal history of other diseases of the musculoskeletal system and connective tissue: Secondary | ICD-10-CM | POA: Diagnosis not present

## 2012-08-25 DIAGNOSIS — E039 Hypothyroidism, unspecified: Secondary | ICD-10-CM | POA: Insufficient documentation

## 2012-08-25 DIAGNOSIS — Z862 Personal history of diseases of the blood and blood-forming organs and certain disorders involving the immune mechanism: Secondary | ICD-10-CM | POA: Insufficient documentation

## 2012-08-25 DIAGNOSIS — R319 Hematuria, unspecified: Secondary | ICD-10-CM | POA: Insufficient documentation

## 2012-08-25 DIAGNOSIS — Y939 Activity, unspecified: Secondary | ICD-10-CM | POA: Insufficient documentation

## 2012-08-25 DIAGNOSIS — Z7982 Long term (current) use of aspirin: Secondary | ICD-10-CM | POA: Diagnosis not present

## 2012-08-25 DIAGNOSIS — E119 Type 2 diabetes mellitus without complications: Secondary | ICD-10-CM | POA: Insufficient documentation

## 2012-08-25 DIAGNOSIS — Z79899 Other long term (current) drug therapy: Secondary | ICD-10-CM | POA: Diagnosis not present

## 2012-08-25 DIAGNOSIS — K219 Gastro-esophageal reflux disease without esophagitis: Secondary | ICD-10-CM | POA: Insufficient documentation

## 2012-08-25 DIAGNOSIS — Z8679 Personal history of other diseases of the circulatory system: Secondary | ICD-10-CM | POA: Diagnosis not present

## 2012-08-25 DIAGNOSIS — X58XXXA Exposure to other specified factors, initial encounter: Secondary | ICD-10-CM | POA: Insufficient documentation

## 2012-08-25 LAB — CBC WITH DIFFERENTIAL/PLATELET
Basophils Absolute: 0 10*3/uL (ref 0.0–0.2)
Basos: 0 % (ref 0–3)
Eos: 1 % (ref 0–5)
Eosinophils Absolute: 0 10*3/uL (ref 0.0–0.4)
Eosinophils Absolute: 0 10*3/uL (ref 0.0–0.7)
Eosinophils Relative: 0 % (ref 0–5)
HCT: 35.5 % (ref 34.0–46.6)
Hemoglobin: 11.9 g/dL (ref 11.1–15.9)
Hemoglobin: 12.1 g/dL (ref 12.0–15.0)
Immature Grans (Abs): 0 10*3/uL (ref 0.0–0.1)
Immature Granulocytes: 0 % (ref 0–2)
Lymphocytes Absolute: 2 10*3/uL (ref 0.7–3.1)
Lymphs Abs: 0.9 10*3/uL (ref 0.7–4.0)
Lymphs: 35 % (ref 14–46)
MCH: 27.3 pg (ref 26.6–33.0)
MCH: 27.8 pg (ref 26.0–34.0)
MCHC: 33.5 g/dL (ref 31.5–35.7)
MCV: 81 fL (ref 79–97)
MCV: 85.1 fL (ref 78.0–100.0)
Monocytes Absolute: 0.7 10*3/uL (ref 0.1–0.9)
Monocytes Relative: 5 % (ref 3–12)
Monocytes: 11 % (ref 4–12)
Neutrophils Absolute: 3 10*3/uL (ref 1.4–7.0)
Neutrophils Relative %: 53 % (ref 40–74)
Platelets: 176 10*3/uL (ref 150–400)
RBC: 4.36 x10E6/uL (ref 3.77–5.28)
RBC: 4.36 MIL/uL (ref 3.87–5.11)
RDW: 13.9 % (ref 12.3–15.4)
WBC: 5.7 10*3/uL (ref 3.4–10.8)

## 2012-08-25 LAB — COMPREHENSIVE METABOLIC PANEL
ALT: 8 IU/L (ref 0–32)
ALT: 9 U/L (ref 0–35)
AST: 18 IU/L (ref 0–40)
AST: 18 U/L (ref 0–37)
Albumin/Globulin Ratio: 1.2 (ref 1.1–2.5)
Albumin: 4.1 g/dL (ref 3.5–4.7)
Albumin: 3.7 g/dL (ref 3.5–5.2)
Alkaline Phosphatase: 85 IU/L (ref 39–117)
BUN/Creatinine Ratio: 24 (ref 11–26)
BUN: 18 mg/dL (ref 8–27)
CO2: 26 mmol/L (ref 18–29)
Calcium: 9.8 mg/dL (ref 8.6–10.2)
Chloride: 100 mmol/L (ref 97–108)
Chloride: 99 mEq/L (ref 96–112)
Creatinine, Ser: 0.74 mg/dL (ref 0.57–1.00)
Creatinine, Ser: 0.67 mg/dL (ref 0.50–1.10)
GFR calc Af Amer: 84 mL/min/{1.73_m2} (ref >59)
GFR calc non Af Amer: 73 mL/min/{1.73_m2} (ref >59)
Globulin, Total: 3.3 g/dL (ref 1.5–4.5)
Glucose: 99 mg/dL (ref 65–99)
Potassium: 4.2 mmol/L (ref 3.5–5.2)
Potassium: 4.6 mEq/L (ref 3.5–5.1)
Sodium: 141 mmol/L (ref 134–144)
Sodium: 136 mEq/L (ref 135–145)
Total Bilirubin: 0.5 mg/dL (ref 0.0–1.2)
Total Bilirubin: 0.7 mg/dL (ref 0.3–1.2)
Total Protein: 7.4 g/dL (ref 6.0–8.5)

## 2012-08-25 LAB — URINALYSIS, ROUTINE W REFLEX MICROSCOPIC
Glucose, UA: NEGATIVE mg/dL
Ketones, ur: 15 mg/dL — AB
Protein, ur: >300 mg/dL — AB

## 2012-08-25 LAB — POCT URINALYSIS DIP (DEVICE)
Glucose, UA: NEGATIVE mg/dL
Leukocytes, UA: NEGATIVE
Nitrite: NEGATIVE
pH: 6.5 (ref 5.0–8.0)

## 2012-08-25 LAB — URINE MICROSCOPIC-ADD ON

## 2012-08-25 MED ORDER — IOHEXOL 300 MG/ML  SOLN
50.0000 mL | Freq: Once | INTRAMUSCULAR | Status: AC | PRN
  Administered 2012-08-25: 50 mL via ORAL

## 2012-08-25 MED ORDER — SODIUM CHLORIDE 0.9 % IV BOLUS (SEPSIS)
500.0000 mL | Freq: Once | INTRAVENOUS | Status: AC
  Administered 2012-08-25: 500 mL via INTRAVENOUS

## 2012-08-25 MED ORDER — MORPHINE SULFATE 4 MG/ML IJ SOLN
4.0000 mg | Freq: Once | INTRAMUSCULAR | Status: AC
  Administered 2012-08-25: 4 mg via INTRAVENOUS
  Filled 2012-08-25: qty 1

## 2012-08-25 MED ORDER — IOHEXOL 300 MG/ML  SOLN
100.0000 mL | Freq: Once | INTRAMUSCULAR | Status: AC | PRN
  Administered 2012-08-25: 100 mL via INTRAVENOUS

## 2012-08-25 MED ORDER — ONDANSETRON HCL 4 MG/2ML IJ SOLN
4.0000 mg | Freq: Once | INTRAMUSCULAR | Status: AC
  Administered 2012-08-25: 4 mg via INTRAVENOUS
  Filled 2012-08-25: qty 2

## 2012-08-25 NOTE — ED Notes (Addendum)
Pt  Seen  yest   [facility]  For  Abdominal  Pain     She  Continues  To  Have  Pain at  This  Time     She  Is awake  And  Alert      She  Reports  She  Called  Her  Clinic  And  Was told  tthat  She  Could  Not  Be  Seen until  tommorow  In the  Meantime  She  Was  To  Come  To  Urgent  Care  She  Reports  Pain is  In  rlq   And  Radiates   Down   Her  Legs   Are     Edematous

## 2012-08-25 NOTE — ED Notes (Signed)
Myself and Thayer Ohm, RN assisted pt from wheelchair to bedside commode; family at bedside

## 2012-08-25 NOTE — ED Provider Notes (Signed)
History    CSN: [id number] Arrival date & time 08/25/12  1554  First MD Initiated Contact with Patient 08/25/12 1826     Chief Complaint  Patient presents with  . Abdominal Pain  . Hematuria   (Consider location/radiation/quality/duration/timing/severity/associated sxs/prior Treatment) Patient is a 79 y.o. female presenting with abdominal pain. The history is provided by the patient.  Abdominal Pain This is a new problem. The current episode started yesterday. The problem occurs constantly. The problem has been unchanged. Associated symptoms include abdominal pain, anorexia and nausea. Pertinent negatives include no chest pain, chills, congestion, fever, vomiting or weakness. Exacerbated by: lifting right leg, walking. She has tried nothing for the symptoms.   Past Medical History  Diagnosis Date  . Hypertension   . Osteoporosis   . GERD (gastroesophageal reflux disease) 06/29/2011  . Hypothyroid 06/29/2011  . HTN (hypertension) 06/29/2011  . Platelet disorder     ? Von Willebrand  . Hyperlipidemia   . Diabetes mellitus without complication   . Edema   . BBB (bundle branch block)     RT  . Hiatal hernia   . Fibrocystic breast   . Arthritis     Osteoarthritis  . Vertigo   . Insomnia   . Gait disorder    Past Surgical History  Procedure Laterality Date  . Hip surgery Right 2002    ORIF  . Thyroidectomy  1952  . Tonsillectomy    . Femur im nail  06/29/2011    Procedure: INTRAMEDULLARY (IM) NAIL FEMORAL;  Surgeon: Verlee Rossetti, MD;  Location: [facility] OR;  Service: Orthopedics;  Laterality: Left;  . Rotator cuff repair Left 1999    Tear  . Abdominal hysterectomy    . Eye surgery Right 2005    Cataract surgery  . Eye surgery Left 2006    Cataract surgery   Family History  Problem Relation Age of Onset  . Cancer Mother     Stomach  . Cancer Father     Prostate  . Emphysema Father   . Alcohol abuse Sister   . Liver disease Sister   . Kidney disease Brother   .  Hypertension Daughter   . Cancer Brother     Prostate, Bladder  . Emphysema Brother   . Cancer Brother     Prostate  . Arthritis Daughter     Knees  . Heart murmur Son   . Mental illness Son     Autism   History  Substance Use Topics  . Smoking status: Never Smoker   . Smokeless tobacco: Not on file  . Alcohol Use: No   OB History   Grav Para Term Preterm Abortions TAB SAB Ect Mult Living                 Review of Systems  Constitutional: Negative for fever and chills.  HENT: Negative for congestion and rhinorrhea.   Respiratory: Negative for chest tightness and shortness of breath.   Cardiovascular: Negative for chest pain.  Gastrointestinal: Positive for nausea, abdominal pain and anorexia. Negative for vomiting, diarrhea, constipation and rectal pain.  Genitourinary: Negative for dysuria.  Musculoskeletal: Negative for back pain.  Neurological: Negative for dizziness and weakness.  All other systems reviewed and are negative.    Allergies  Review of patient's allergies indicates no known allergies.  Home Medications   Current Outpatient Rx  Name  Route  Sig  Dispense  Refill  . aspirin 81 MG tablet   Oral  Take 81 mg by mouth daily.         Marland Kitchen CALCIUM CARBONATE PO   Oral   Take 1 tablet by mouth daily.         . furosemide (LASIX) 40 MG tablet      Take one tablet by mouth once daily for edema         . levothyroxine (SYNTHROID) 25 MCG tablet   Oral   Take 1 tablet (25 mcg total) by mouth daily. Take one tablet by mouth daily for thyroid supplement.   90 tablet   3   . losartan (COZAAR) 50 MG tablet   Oral   Take 1 tablet (50 mg total) by mouth daily. Take one tablet by mouth once daily for blood pressure   90 tablet   3   . Multiple Vitamins-Minerals (CENTRUM PO)   Oral   Take 1 tablet by mouth daily.         . pantoprazole (PROTONIX) 40 MG tablet   Oral   Take 1 tablet (40 mg total) by mouth daily. Take one tablet once daily for  stomach   90 tablet   3   . tolterodine (DETROL LA) 4 MG 24 hr capsule   Oral   Take 1 capsule (4 mg total) by mouth daily. Take one tablet once daily for bladder   90 capsule   3    BP 179/58  Pulse 65  Temp(Src) 99 F (37.2 C) (Oral)  Resp 18  SpO2 98% Physical Exam  Nursing note and vitals reviewed. Constitutional: She is oriented to person, place, and time. She appears well-developed and well-nourished. No distress.  HENT:  Head: Normocephalic and atraumatic.  Mouth/Throat: Oropharynx is clear and moist.  Eyes: EOM are normal. Pupils are equal, round, and reactive to light.  Neck: Normal range of motion. Neck supple.  Cardiovascular: Normal rate, regular rhythm and normal heart sounds.  Exam reveals no friction rub.   No murmur heard. Pulmonary/Chest: Effort normal and breath sounds normal. No respiratory distress. She has no wheezes. She has no rales.  Abdominal: Soft. There is tenderness (mild TTP over right groin region and right lateral abdomen. no mass or hernia. ). There is no rebound and no guarding.  Musculoskeletal: Normal range of motion. She exhibits no edema and no tenderness.  Lymphadenopathy:    She has no cervical adenopathy.  Neurological: She is alert and oriented to person, place, and time.  Skin: Skin is warm and dry. No rash noted.  Psychiatric: She has a normal mood and affect. Her behavior is normal.    ED Course  Procedures (including critical care time) Labs Reviewed  COMPREHENSIVE METABOLIC PANEL - Abnormal; Notable for the following:    Glucose, Bld 126 (*)    GFR calc non Af Amer 77 (*)    GFR calc Af Amer 89 (*)    All other components within normal limits  CBC WITH DIFFERENTIAL - Abnormal; Notable for the following:    Neutrophils Relative % 82 (*)    All other components within normal limits  URINALYSIS, ROUTINE W REFLEX MICROSCOPIC - Abnormal; Notable for the following:    Hgb urine dipstick MODERATE (*)    Bilirubin Urine SMALL (*)     Ketones, ur 15 (*)    Protein, ur >300 (*)    All other components within normal limits  URINE MICROSCOPIC-ADD ON - Abnormal; Notable for the following:    Squamous Epithelial / LPF FEW (*)  Bacteria, UA FEW (*)    Casts HYALINE CASTS (*)    All other components within normal limits  URINE CULTURE   Ct Abdomen Pelvis W Contrast  08/25/2012   *RADIOLOGY REPORT*  Clinical Data: Diffuse abdominal pain, hematuria, and diminished appetite.  CT ABDOMEN AND PELVIS WITH CONTRAST  Technique:  Multidetector CT imaging of the abdomen and pelvis was performed following the standard protocol during bolus administration of intravenous contrast.  Contrast: OMNIPAQUE IOHEXOL 300 MG/ML  SOLN  Comparison: CT scan dated 07/04/2011  Findings: The liver, spleen, adrenal glands, kidneys are normal. There is slight prominence of the common bile duct and pancreatic duct but there is no evidence of inflammation or a mass.  No dilated loops of large or small bowel.  The cecum lies in the mid abdomen.  The appendix is not discretely identifiable.  There are no dilated loops of large or small bowel and there is no identifiable inflammation.  No free air free fluid.  Uterus is normal.  Bladder appears normal.  No acute osseous abnormality.  IMPRESSION: No visible acute abnormalities.  However, the appendix is not identified.   Original Report Authenticated By: Francene Boyers, M.D.   1. Abdominal pain   2. Abdominal wall strain, initial encounter     MDM  85:31 PM 79 year old female with history of hypertension, GERD diabetes presenting at request of urgent care due to concern for right lower quadrant abdominal pain. She states pain started yesterday and was intermittent, but today is worse now constant and radiating across her lower abdomen. She localizes the pain over her right groin region. She was seen by her PCP yesterday and initially was felt to be muscle strain was she was walking awkwardly recently. At urgent  care she had a urine dip which showed moderate blood and so was transferred for further evaluation. She endorses nausea and anorexia but no vomiting, diarrhea or fever. No history of kidney stones. She has had no abdominal surgeries in the past. Abdominal exam is benign. No flank pain. We'll check labs urinalysis and CT stone study.  9:50PM: Labs show no significant abnormality. CT negative. Visualization of the appendix was poor, but overall picture is not consistent with appendicitis. Exam is not consistent with appendicitis. She does have moderate blood in her urine, however she had blood in her urine as far back as April of 2013. This does not seem to be new. This is most likely muscle strain sustained from her recent difficulty walking. Discussed with family. She will f/u with per PCP in 48hrs for recheck. She was given strong return precautions including worsening pain, persistent N/V, or fever. Pt and family voiced understanding and dc'd home in stable condition.   Caren Hazy, MD 08/25/12 2308

## 2012-08-25 NOTE — ED Notes (Signed)
Patient transported to CT 

## 2012-08-25 NOTE — ED Notes (Addendum)
Myself and Heather, EMT assisted pt from bedside commode, got pt undressed and into a gown; family at bedside

## 2012-08-25 NOTE — ED Notes (Signed)
Patient back from CT.

## 2012-08-25 NOTE — ED Notes (Signed)
C/o lower abd pain with bloating, nausea, decreased appetite since yesterday. Denies urinary frequency, dysuria or visible hematuria. States pain has progressively worsened throughout the day. Sent here to ED from [facility].

## 2012-08-25 NOTE — ED Notes (Signed)
CT notified that patient has finished drinking oral contrast.

## 2012-08-25 NOTE — ED Notes (Signed)
Pt states since Sunday started having right lower and side pain with some nausea.  PT seen at urgent care and told to come here for further eval and r/o stone

## 2012-08-25 NOTE — ED Provider Notes (Signed)
History    CSN: [id number] Arrival date & time 08/25/12  1339  First MD Initiated Contact with Patient 08/25/12 1458     Chief Complaint  Patient presents with  . Abdominal Pain   (Consider location/radiation/quality/duration/timing/severity/associated sxs/prior Treatment) Patient is a 79 y.o. female presenting with abdominal pain. The history is provided by the patient.  Abdominal Pain This is a new problem. The current episode started more than 2 days ago. The problem has not changed since onset.Associated symptoms include abdominal pain.   Past Medical History  Diagnosis Date  . Hypertension   . Osteoporosis   . GERD (gastroesophageal reflux disease) 06/29/2011  . Hypothyroid 06/29/2011  . HTN (hypertension) 06/29/2011  . Platelet disorder     ? Von Willebrand  . Hyperlipidemia   . Diabetes mellitus without complication   . Edema   . BBB (bundle branch block)     RT  . Hiatal hernia   . Fibrocystic breast   . Arthritis     Osteoarthritis  . Vertigo   . Insomnia   . Gait disorder    Past Surgical History  Procedure Laterality Date  . Hip surgery Right 2002    ORIF  . Thyroidectomy  1952  . Tonsillectomy    . Femur im nail  06/29/2011    Procedure: INTRAMEDULLARY (IM) NAIL FEMORAL;  Surgeon: Verlee Rossetti, MD;  Location: [facility] OR;  Service: Orthopedics;  Laterality: Left;  . Rotator cuff repair Left 1999    Tear  . Abdominal hysterectomy    . Eye surgery Right 2005    Cataract surgery  . Eye surgery Left 2006    Cataract surgery   Family History  Problem Relation Age of Onset  . Cancer Mother     Stomach  . Cancer Father     Prostate  . Emphysema Father   . Alcohol abuse Sister   . Liver disease Sister   . Kidney disease Brother   . Hypertension Daughter   . Cancer Brother     Prostate, Bladder  . Emphysema Brother   . Cancer Brother     Prostate  . Arthritis Daughter     Knees  . Heart murmur Son   . Mental illness Son     Autism   History   Substance Use Topics  . Smoking status: Never Smoker   . Smokeless tobacco: Not on file  . Alcohol Use: No   OB History   Grav Para Term Preterm Abortions TAB SAB Ect Mult Living                 Review of Systems  Constitutional: Negative.   Cardiovascular: Positive for leg swelling.  Gastrointestinal: Positive for abdominal pain. Negative for nausea and vomiting.    Allergies  Review of patient's allergies indicates no known allergies.  Home Medications   Current Outpatient Rx  Name  Route  Sig  Dispense  Refill  . aspirin 81 MG tablet   Oral   Take 81 mg by mouth daily.         Marland Kitchen CALCIUM CARBONATE PO   Oral   Take 1 tablet by mouth daily.         . furosemide (LASIX) 40 MG tablet      Take one tablet by mouth once daily for edema         . levothyroxine (SYNTHROID) 25 MCG tablet   Oral   Take 1 tablet (25 mcg total) by  mouth daily. Take one tablet by mouth daily for thyroid supplement.   90 tablet   3   . losartan (COZAAR) 50 MG tablet   Oral   Take 1 tablet (50 mg total) by mouth daily. Take one tablet by mouth once daily for blood pressure   90 tablet   3   . Multiple Vitamins-Minerals (CENTRUM PO)   Oral   Take 1 tablet by mouth daily.         . pantoprazole (PROTONIX) 40 MG tablet   Oral   Take 1 tablet (40 mg total) by mouth daily. Take one tablet once daily for stomach   90 tablet   3   . tolterodine (DETROL LA) 4 MG 24 hr capsule   Oral   Take 1 capsule (4 mg total) by mouth daily. Take one tablet once daily for bladder   90 capsule   3    BP 185/57  Pulse 67  Temp(Src) 97.4 F (36.3 C) (Oral)  Resp 14  SpO2 100% Physical Exam  Nursing note and vitals reviewed. Constitutional: She is oriented to person, place, and time. She appears well-developed and well-nourished. She appears distressed.  HENT:  Head: Normocephalic.  Neck: Normal range of motion. Neck supple.  Cardiovascular: Regular rhythm and normal heart sounds.    Pulmonary/Chest: Breath sounds normal.  Abdominal: Soft. Bowel sounds are normal. She exhibits no distension and no mass. There is tenderness. There is no rebound and no guarding.  Musculoskeletal: She exhibits edema.  Neurological: She is alert and oriented to person, place, and time.  Skin: Skin is warm and dry.    ED Course  Procedures (including critical care time) Labs Reviewed  POCT URINALYSIS DIP (DEVICE) - Abnormal; Notable for the following:    Bilirubin Urine SMALL (*)    Ketones, ur 40 (*)    Hgb urine dipstick MODERATE (*)    Protein, ur >=300 (*)    All other components within normal limits   No results found. 1. Abdominal pain of unknown etiology     MDM  Sent for eval of rlq abd pain since yest, weakness, diabetic., hematuria--poss ureteral stone.  Linna Hoff, MD 08/25/12 [phone]

## 2012-08-26 ENCOUNTER — Ambulatory Visit: Payer: Self-pay | Admitting: Internal Medicine

## 2012-08-26 ENCOUNTER — Encounter: Payer: Self-pay | Admitting: Nurse Practitioner

## 2012-08-26 ENCOUNTER — Ambulatory Visit: Payer: Medicare Other | Admitting: Nurse Practitioner

## 2012-08-26 ENCOUNTER — Ambulatory Visit (INDEPENDENT_AMBULATORY_CARE_PROVIDER_SITE_OTHER): Payer: Medicare Other | Admitting: Nurse Practitioner

## 2012-08-26 VITALS — BP 130/58 | HR 83 | Temp 98.2°F | Resp 18 | Ht <= 58 in | Wt 155.0 lb

## 2012-08-26 DIAGNOSIS — R109 Unspecified abdominal pain: Secondary | ICD-10-CM | POA: Diagnosis not present

## 2012-08-26 NOTE — Patient Instructions (Addendum)
Please keep follow up appt with Dr Renato Gails  If you are having worsening of symptoms, pain, fevers, chills or nausea or vomiting please seek medical attention.

## 2012-08-26 NOTE — Progress Notes (Signed)
Patient ID: Jessica Hawkins, female   DOB: May 28, 1922, 79 y.o.   MRN: [id number]   Allergies  Allergen Reactions  . Cabbage     unknown    Chief Complaint  Patient presents with  . Acute Visit     follow up urgent care    HPI: Patient is a 79 y.o. female seen in the office today for follow up from urgent care. Per epic labs showed no significant abnormality. CT negative and appendicitis unlikely due to exam and CT. She does have moderate blood in her urine, however she had blood in her urine as far back as April of 2013. most likely muscle strain sustained from her recent difficulty walking- pt also reports symptoms improved with aleve  Pt reports she is doing much better today. No fevers or chills. Pain in abdomen is minimal and intermediately occurs. Waiting on urine culture from urgent care  Review of Systems:  Review of Systems  Constitutional: Positive for malaise/fatigue. Negative for fever and chills.  Respiratory: Negative for shortness of breath.   Cardiovascular: Negative for chest pain and palpitations.  Gastrointestinal: Positive for abdominal pain. Negative for nausea, vomiting, diarrhea, constipation and blood in stool.       Pain is intermediate and less intense than yesterday-- overall has improved   Genitourinary: Negative for dysuria, urgency, frequency and hematuria.  Musculoskeletal: Negative for myalgias.     Past Medical History  Diagnosis Date  . Hypertension   . Osteoporosis   . GERD (gastroesophageal reflux disease) 06/29/2011  . Hypothyroid 06/29/2011  . HTN (hypertension) 06/29/2011  . Platelet disorder     ? Von Willebrand  . Hyperlipidemia   . Diabetes mellitus without complication   . Edema   . BBB (bundle branch block)     RT  . Hiatal hernia   . Fibrocystic breast   . Arthritis     Osteoarthritis  . Vertigo   . Insomnia   . Gait disorder    Past Surgical History  Procedure Laterality Date  . Hip surgery Right 2002    ORIF  .  Thyroidectomy  1952  . Tonsillectomy    . Femur im nail  06/29/2011    Procedure: INTRAMEDULLARY (IM) NAIL FEMORAL;  Surgeon: Verlee Rossetti, MD;  Location: [facility] OR;  Service: Orthopedics;  Laterality: Left;  . Rotator cuff repair Left 1999    Tear  . Abdominal hysterectomy    . Eye surgery Right 2005    Cataract surgery  . Eye surgery Left 2006    Cataract surgery   Social History:   reports that she has never smoked. She does not have any smokeless tobacco history on file. She reports that she does not drink alcohol or use illicit drugs.  Family History  Problem Relation Age of Onset  . Cancer Mother     Stomach  . Cancer Father     Prostate  . Emphysema Father   . Alcohol abuse Sister   . Liver disease Sister   . Kidney disease Brother   . Hypertension Daughter   . Cancer Brother     Prostate, Bladder  . Emphysema Brother   . Cancer Brother     Prostate  . Arthritis Daughter     Knees  . Heart murmur Son   . Mental illness Son     Autism    Medications: Patient's Medications  New Prescriptions   No medications on file  Previous Medications   ASPIRIN 81  MG TABLET    Take 81 mg by mouth daily.   CALCIUM CARBONATE PO    Take 1 tablet by mouth daily.   FUROSEMIDE (LASIX) 40 MG TABLET    Take 40 mg by mouth daily. Take one tablet by mouth once daily for edema   LEVOTHYROXINE (SYNTHROID) 25 MCG TABLET    Take 1 tablet (25 mcg total) by mouth daily. Take one tablet by mouth daily for thyroid supplement.   LOSARTAN (COZAAR) 50 MG TABLET    Take 1 tablet (50 mg total) by mouth daily. Take one tablet by mouth once daily for blood pressure   MULTIPLE VITAMINS-MINERALS (CENTRUM PO)    Take 1 tablet by mouth daily.   PANTOPRAZOLE (PROTONIX) 40 MG TABLET    Take 1 tablet (40 mg total) by mouth daily. Take one tablet once daily for stomach   TOLTERODINE (DETROL LA) 4 MG 24 HR CAPSULE    Take 1 capsule (4 mg total) by mouth daily. Take one tablet once daily for bladder  Modified  Medications   No medications on file  Discontinued Medications   No medications on file     Physical Exam:  Filed Vitals:   08/26/12 1624  BP: 130/58  Pulse: 83  Temp: 98.2 F (36.8 C)  TempSrc: Oral  Resp: 18  Height: 4\' 4"  (1.321 m)  Weight: 155 lb (70.308 kg)  SpO2: 96%   Physical Exam  Constitutional: She is well-developed, well-nourished, and in no distress. No distress.  Eyes: Conjunctivae and EOM are normal. Pupils are equal, round, and reactive to light.  Neck: Normal range of motion. Neck supple.  Cardiovascular: Normal rate, regular rhythm and normal heart sounds.   Pulmonary/Chest: Effort normal and breath sounds normal. No respiratory distress.  Abdominal: Soft. Bowel sounds are normal. She exhibits no distension and no mass. There is no tenderness. There is no rebound and no guarding.  Musculoskeletal: Normal range of motion. She exhibits no edema and no tenderness.  Neurological: She is alert.  Skin: Skin is warm and dry. She is not diaphoretic.     Labs reviewed: Basic Metabolic Panel:  Recent Labs  57/84/69 1004 08/24/12 1648 08/25/12 1619  NA 142 141 136  K 4.3 4.2 4.6  CL 101 100 99  CO2 29* 26 29  GLUCOSE 94 99 126*  BUN 19 18 14   CREATININE 0.71 0.74 0.67  CALCIUM 9.5 9.8 9.7   Liver Function Tests:  Recent Labs  08/24/12 1648 08/25/12 1619  AST 18 18  ALT 8 9  ALKPHOS 85 80  BILITOT 0.5 0.7  PROT 7.4 7.7  ALBUMIN  --  3.7   No results found for this basename: LIPASE, AMYLASE,  in the last 8760 hours No results found for this basename: AMMONIA,  in the last 8760 hours CBC:  Recent Labs  08/24/12 1648 08/25/12 1619  WBC 5.7 7.0  NEUTROABS 3.0 5.8  HGB 11.9 12.1  HCT 35.5 37.1  MCV 81 85.1  PLT  --  176   Lipid Panel: No results found for this basename: CHOL, HDL, LDLCALC, TRIG, CHOLHDL, LDLDIRECT,  in the last 8760 hours  Past Procedures:     Assessment/Plan Abdominal pain- Improved- may cont to use tylenol as  needed if mild pain occurs. Keep follow up with Dr Renato Gails; awaiting culture and sensitivity from urgent care. Pt and family educated on worsening of symptoms, pain, fevers, chills or nausea or vomiting please seek medical attention.

## 2012-08-27 LAB — URINE CULTURE

## 2012-08-27 NOTE — ED Provider Notes (Signed)
I saw and evaluated the patient, reviewed the resident's note and I agree with the findings and plan.  Abdomen soft and nontender except for far right lateral flank small area without chest wall tenderness or CVAT.  Hurman Horn, MD 08/27/12 [phone]

## 2012-08-31 DIAGNOSIS — Q828 Other specified congenital malformations of skin: Secondary | ICD-10-CM | POA: Diagnosis not present

## 2012-08-31 DIAGNOSIS — M79609 Pain in unspecified limb: Secondary | ICD-10-CM | POA: Diagnosis not present

## 2012-08-31 DIAGNOSIS — B351 Tinea unguium: Secondary | ICD-10-CM | POA: Diagnosis not present

## 2012-09-03 ENCOUNTER — Other Ambulatory Visit: Payer: Medicare Other

## 2012-09-03 DIAGNOSIS — R1031 Right lower quadrant pain: Secondary | ICD-10-CM

## 2012-09-03 DIAGNOSIS — G8929 Other chronic pain: Secondary | ICD-10-CM

## 2012-09-03 DIAGNOSIS — E785 Hyperlipidemia, unspecified: Secondary | ICD-10-CM | POA: Diagnosis not present

## 2012-09-04 LAB — LIPID PANEL
HDL: 66 mg/dL (ref >39)
LDL Calculated: 84 mg/dL (ref 0–99)
VLDL Cholesterol Cal: 11 mg/dL (ref 5–40)

## 2012-09-08 ENCOUNTER — Encounter: Payer: Self-pay | Admitting: *Deleted

## 2012-09-09 ENCOUNTER — Encounter: Payer: Self-pay | Admitting: Internal Medicine

## 2012-09-09 ENCOUNTER — Ambulatory Visit (INDEPENDENT_AMBULATORY_CARE_PROVIDER_SITE_OTHER): Payer: Medicare Other | Admitting: Internal Medicine

## 2012-09-09 VITALS — BP 150/88 | HR 80 | Temp 98.3°F | Resp 16 | Ht <= 58 in | Wt 151.8 lb

## 2012-09-09 DIAGNOSIS — E785 Hyperlipidemia, unspecified: Secondary | ICD-10-CM

## 2012-09-09 DIAGNOSIS — E1159 Type 2 diabetes mellitus with other circulatory complications: Secondary | ICD-10-CM

## 2012-09-09 DIAGNOSIS — I10 Essential (primary) hypertension: Secondary | ICD-10-CM | POA: Diagnosis not present

## 2012-09-09 DIAGNOSIS — E039 Hypothyroidism, unspecified: Secondary | ICD-10-CM | POA: Diagnosis not present

## 2012-09-09 DIAGNOSIS — M129 Arthropathy, unspecified: Secondary | ICD-10-CM

## 2012-09-09 DIAGNOSIS — R609 Edema, unspecified: Secondary | ICD-10-CM

## 2012-09-09 DIAGNOSIS — K219 Gastro-esophageal reflux disease without esophagitis: Secondary | ICD-10-CM

## 2012-09-09 DIAGNOSIS — M1990 Unspecified osteoarthritis, unspecified site: Secondary | ICD-10-CM

## 2012-09-09 DIAGNOSIS — R269 Unspecified abnormalities of gait and mobility: Secondary | ICD-10-CM

## 2012-09-09 NOTE — Patient Instructions (Addendum)
Consider seeing another orthopedist about your knee.

## 2012-09-09 NOTE — Progress Notes (Signed)
Subjective:    Patient ID: Jessica Hawkins, female    DOB: 1923/02/12, 79 y.o.   MRN: [id number]  HPI HTN (hypertension): generally well controlled. High today because she missed her medication.  Arthritis: knee pains. No cartilage in right leg. Thinking about seeing surgeon about knee replacement.  Hypothyroid: not rechecked since Oct 2013  Type II or unspecified type diabetes mellitus with peripheral circulatory disorders, not stated as uncontrolled(250.70): under good control  Edema of the legs persists.  Requiring assistance with bathing, meal preparation, prompting for medications.  Current Outpatient Prescriptions on File Prior to Visit  Medication Sig Dispense Refill  . aspirin 81 MG tablet Take 81 mg by mouth daily.      Marland Kitchen CALCIUM CARBONATE PO Take 1 tablet by mouth daily.      . furosemide (LASIX) 40 MG tablet Take 40 mg by mouth daily. Take one tablet by mouth once daily for edema      . levothyroxine (SYNTHROID) 25 MCG tablet Take 1 tablet (25 mcg total) by mouth daily. Take one tablet by mouth daily for thyroid supplement.  90 tablet  3  . losartan (COZAAR) 50 MG tablet Take 1 tablet (50 mg total) by mouth daily. Take one tablet by mouth once daily for blood pressure  90 tablet  3  . Multiple Vitamins-Minerals (CENTRUM PO) Take 1 tablet by mouth daily.      . pantoprazole (PROTONIX) 40 MG tablet Take 1 tablet (40 mg total) by mouth daily. Take one tablet once daily for stomach  90 tablet  3  . tolterodine (DETROL LA) 4 MG 24 hr capsule Take 1 capsule (4 mg total) by mouth daily. Take one tablet once daily for bladder  90 capsule  3     Review of Systems  Constitutional: Negative for fever, chills, activity change, appetite change, fatigue and unexpected weight change.  HENT: Negative.   Eyes: Negative.   Respiratory: Negative.  Negative for cough and shortness of breath.   Cardiovascular: Positive for leg swelling. Negative for chest pain and palpitations.   Gastrointestinal: Negative for vomiting, diarrhea and abdominal distention.  Endocrine: Negative for cold intolerance, heat intolerance, polydipsia, polyphagia and polyuria.  Genitourinary:       Incontinent  Musculoskeletal: Positive for myalgias, back pain, joint swelling, arthralgias and gait problem.  Skin: Negative.   Neurological: Negative.   Hematological: Negative.   Psychiatric/Behavioral: Negative.        Objective:BP 164/72  Pulse 80  Temp(Src) 98.3 F (36.8 C) (Oral)  Resp 16  Ht 4\' 10"  (1.473 m)  Wt 151 lb 12.8 oz (68.856 kg)  BMI 31.73 kg/m2    Physical Exam  Constitutional: She is oriented to person, place, and time. She appears well-developed. No distress.  frail  HENT:  Head: Normocephalic and atraumatic.  Left Ear: External ear normal.  Nose: Nose normal.  Eyes: Conjunctivae and EOM are normal. Pupils are equal, round, and reactive to light.  Neck: Normal range of motion. Neck supple. No JVD present. No tracheal deviation present. No thyromegaly present.  Cardiovascular: Normal rate, regular rhythm, normal heart sounds and intact distal pulses.  Exam reveals no gallop and no friction rub.   No murmur heard. Varicose veins.  Pulmonary/Chest: No respiratory distress. She has no wheezes. She has no rales.  Abdominal: She exhibits no distension and no mass. There is no tenderness.  Musculoskeletal: She exhibits edema and tenderness.  Poor balance. Using walker.  Lymphadenopathy:    She has no  cervical adenopathy.  Neurological: She is alert and oriented to person, place, and time. No cranial nerve deficit. Coordination normal.  Skin: No rash noted. No erythema. No pallor.  Seborrheic keratoses  Psychiatric: She has a normal mood and affect. Her behavior is normal. Thought content normal.          Appointment on 09/03/2012  Component Date Value Range Status  . Glucose 08/24/2012 99  65 - 99 mg/dL Final  . BUN 29/56/2130 18  8 - 27 mg/dL Final  .  Creatinine, Ser 08/24/2012 0.74  0.57 - 1.00 mg/dL Final  . GFR calc non Af Amer 08/24/2012 73  >59 mL/min/1.73 Final  . GFR calc Af Amer 08/24/2012 84  >59 mL/min/1.73 Final  . BUN/Creatinine Ratio 08/24/2012 24  11 - 26 Final  . Sodium 08/24/2012 141  134 - 144 mmol/L Final  . Potassium 08/24/2012 4.2  3.5 - 5.2 mmol/L Final  . Chloride 08/24/2012 100  97 - 108 mmol/L Final  . CO2 08/24/2012 26  18 - 29 mmol/L Final                 **Please note reference interval change**  . Calcium 08/24/2012 9.8  8.6 - 10.2 mg/dL Final  . Total Protein 08/24/2012 7.4  6.0 - 8.5 g/dL Final  . Albumin 86/57/8469 4.1  3.5 - 4.7 g/dL Final  . Globulin, Total 08/24/2012 3.3  1.5 - 4.5 g/dL Final  . Albumin/Globulin Ratio 08/24/2012 1.2  1.1 - 2.5 Final  . Total Bilirubin 08/24/2012 0.5  0.0 - 1.2 mg/dL Final  . Alkaline Phosphatase 08/24/2012 85  39 - 117 IU/L Final  . AST 08/24/2012 18  0 - 40 IU/L Final  . ALT 08/24/2012 8  0 - 32 IU/L Final  . WBC 08/24/2012 5.7  3.4 - 10.8 x10E3/uL Final  . RBC 08/24/2012 4.36  3.77 - 5.28 x10E6/uL Final  . Hemoglobin 08/24/2012 11.9  11.1 - 15.9 g/dL Final  . HCT 62/95/2841 35.5  34.0 - 46.6 % Final  . MCV 08/24/2012 81  79 - 97 fL Final  . MCH 08/24/2012 27.3  26.6 - 33.0 pg Final  . MCHC 08/24/2012 33.5  31.5 - 35.7 g/dL Final  . RDW 32/44/0102 13.9  12.3 - 15.4 % Final  . Neutrophils Relative % 08/24/2012 53  40 - 74 % Final  . Lymphs 08/24/2012 35  14 - 46 % Final  . Monocytes 08/24/2012 11  4 - 12 % Final  . Eos 08/24/2012 1  0 - 5 % Final  . Basos 08/24/2012 0  0 - 3 % Final  . Neutrophils Absolute 08/24/2012 3.0  1.4 - 7.0 x10E3/uL Final  . Lymphocytes Absolute 08/24/2012 2.0  0.7 - 3.1 x10E3/uL Final  . Monocytes Absolute 08/24/2012 0.7  0.1 - 0.9 x10E3/uL Final  . Eosinophils Absolute 08/24/2012 0.0  0.0 - 0.4 x10E3/uL Final  . Basophils Absolute 08/24/2012 0.0  0.0 - 0.2 x10E3/uL Final  . Immature Granulocytes 08/24/2012 0  0 - 2 % Final  .  Immature Grans (Abs) 08/24/2012 0.0  0.0 - 0.1 x10E3/uL Final  . Cholesterol, Total 09/03/2012 161  100 - 199 mg/dL Final  . Triglycerides 09/03/2012 54  0 - 149 mg/dL Final  . HDL 72/53/6644 66  >39 mg/dL Final   Comment: According to ATP-III Guidelines, HDL-C >59 mg/dL is considered a  negative risk factor for CHD.  Marland Kitchen VLDL Cholesterol Cal 09/03/2012 11  5 - 40 mg/dL Final  . LDL Calculated 09/03/2012 84  0 - 99 mg/dL Final  . Chol/HDL Ratio 09/03/2012 2.4  0.0 - 4.4 ratio units Final  Admission on 08/25/2012, Discharged on 08/25/2012  Component Date Value Range Status  . Sodium 08/25/2012 136  135 - 145 mEq/L Final  . Potassium 08/25/2012 4.6  3.5 - 5.1 mEq/L Final  . Chloride 08/25/2012 99  96 - 112 mEq/L Final  . CO2 08/25/2012 29  19 - 32 mEq/L Final  . Glucose, Bld 08/25/2012 126* 70 - 99 mg/dL Final  . BUN 40/98/1191 14  6 - 23 mg/dL Final  . Creatinine, Ser 08/25/2012 0.67  0.50 - 1.10 mg/dL Final  . Calcium 47/82/9562 9.7  8.4 - 10.5 mg/dL Final  . Total Protein 08/25/2012 7.7  6.0 - 8.3 g/dL Final  . Albumin 13/10/6576 3.7  3.5 - 5.2 g/dL Final  . AST 46/96/2952 18  0 - 37 U/L Final  . ALT 08/25/2012 9  0 - 35 U/L Final  . Alkaline Phosphatase 08/25/2012 80  39 - 117 U/L Final  . Total Bilirubin 08/25/2012 0.7  0.3 - 1.2 mg/dL Final  . GFR calc non Af Amer 08/25/2012 77* >90 mL/min Final  . GFR calc Af Amer 08/25/2012 89* >90 mL/min Final   Comment:                                 The eGFR has been calculated                          using the CKD EPI equation.                          This calculation has not been                          validated in all clinical                          situations.                          eGFR's persistently                          <90 mL/min signify                          possible Chronic Kidney Disease.  . WBC 08/25/2012 7.0  4.0 - 10.5 K/uL Final  . RBC 08/25/2012 4.36  3.87 - 5.11 MIL/uL Final  .  Hemoglobin 08/25/2012 12.1  12.0 - 15.0 g/dL Final  . HCT 84/13/2440 37.1  36.0 - 46.0 % Final  . MCV 08/25/2012 85.1  78.0 - 100.0 fL Final  . MCH 08/25/2012 27.8  26.0 - 34.0 pg Final  . MCHC 08/25/2012 32.6  30.0 - 36.0 g/dL Final  . RDW 12/29/2534 13.8  11.5 - 15.5 % Final  . Platelets 08/25/2012 176  150 - 400 K/uL Final  . Neutrophils Relative % 08/25/2012 82* 43 - 77 % Final  . Neutro Abs 08/25/2012 5.8  1.7 - 7.7 K/uL Final  .  Lymphocytes Relative 08/25/2012 13  12 - 46 % Final  . Lymphs Abs 08/25/2012 0.9  0.7 - 4.0 K/uL Final  . Monocytes Relative 08/25/2012 5  3 - 12 % Final  . Monocytes Absolute 08/25/2012 0.3  0.1 - 1.0 K/uL Final  . Eosinophils Relative 08/25/2012 0  0 - 5 % Final  . Eosinophils Absolute 08/25/2012 0.0  0.0 - 0.7 K/uL Final  . Basophils Relative 08/25/2012 0  0 - 1 % Final  . Basophils Absolute 08/25/2012 0.0  0.0 - 0.1 K/uL Final  . Color, Urine 08/25/2012 YELLOW  YELLOW Final  . APPearance 08/25/2012 CLEAR  CLEAR Final  . Specific Gravity, Urine 08/25/2012 1.027  1.005 - 1.030 Final  . pH 08/25/2012 6.5  5.0 - 8.0 Final  . Glucose, UA 08/25/2012 NEGATIVE  NEGATIVE mg/dL Final  . Hgb urine dipstick 08/25/2012 MODERATE* NEGATIVE Final  . Bilirubin Urine 08/25/2012 SMALL* NEGATIVE Final  . Ketones, ur 08/25/2012 15* NEGATIVE mg/dL Final  . Protein, ur 40/98/1191 >300* NEGATIVE mg/dL Final  . Urobilinogen, UA 08/25/2012 1.0  0.0 - 1.0 mg/dL Final  . Nitrite 47/82/9562 NEGATIVE  NEGATIVE Final  . Leukocytes, UA 08/25/2012 NEGATIVE  NEGATIVE Final  . Squamous Epithelial / LPF 08/25/2012 FEW* RARE Final  . WBC, UA 08/25/2012 3-6  <3 WBC/hpf Final  . RBC / HPF 08/25/2012 7-10  <3 RBC/hpf Final  . Bacteria, UA 08/25/2012 FEW* RARE Final  . Casts 08/25/2012 HYALINE CASTS* NEGATIVE Final  . Specimen Description 08/25/2012 URINE, CLEAN CATCH   Final  . Special Requests 08/25/2012 NONE   Final  . Culture  Setup Time 08/25/2012 08/25/2012 22:01   Final  .  Colony Count 08/25/2012 >=100,000 COLONIES/ML   Final  . Culture 08/25/2012 Multiple bacterial morphotypes present, none predominant. Suggest appropriate recollection if clinically indicated.   Final  . Report Status 08/25/2012 08/27/2012 FINAL   Final  Admission on 08/25/2012, Discharged on 08/25/2012  Component Date Value Range Status  . Glucose, UA 08/25/2012 NEGATIVE  NEGATIVE mg/dL Final  . Bilirubin Urine 08/25/2012 SMALL* NEGATIVE Final  . Ketones, ur 08/25/2012 40* NEGATIVE mg/dL Final  . Specific Gravity, Urine 08/25/2012 >=1.030  1.005 - 1.030 Final  . Hgb urine dipstick 08/25/2012 MODERATE* NEGATIVE Final  . pH 08/25/2012 6.5  5.0 - 8.0 Final  . Protein, ur 08/25/2012 >=300* NEGATIVE mg/dL Final  . Urobilinogen, UA 08/25/2012 1.0  0.0 - 1.0 mg/dL Final  . Nitrite 13/10/6576 NEGATIVE  NEGATIVE Final  . Leukocytes, UA 08/25/2012 NEGATIVE  NEGATIVE Final   Biochemical Testing Only. Please order routine urinalysis from main lab if confirmatory testing is needed.       Assessment & Plan:  HTN (hypertension) - Plan: Comprehensive metabolic panel  Arthritis: Continue current medications  Hypothyroid - Plan: TSH  Type II or unspecified type diabetes mellitus with peripheral circulatory disorders, not stated as uncontrolled(250.70) - Plan: Comprehensive metabolic panel, Hemoglobin A1c  Edema: No change in orders.  Abnormality of gait: Continue use walker  GERD (gastroesophageal reflux disease): Continue pantoprazole  Other and unspecified hyperlipidemia - Plan: Lipid panel  Forms were completed for the Department [facility] for needs for regular aid and attendance.

## 2012-09-14 DIAGNOSIS — R269 Unspecified abnormalities of gait and mobility: Secondary | ICD-10-CM | POA: Diagnosis not present

## 2012-09-14 DIAGNOSIS — M6281 Muscle weakness (generalized): Secondary | ICD-10-CM | POA: Diagnosis not present

## 2012-09-14 DIAGNOSIS — M25579 Pain in unspecified ankle and joints of unspecified foot: Secondary | ICD-10-CM | POA: Diagnosis not present

## 2012-10-02 ENCOUNTER — Other Ambulatory Visit: Payer: Self-pay | Admitting: Geriatric Medicine

## 2012-10-02 MED ORDER — BAYER MICROLET LANCETS MISC: Status: DC

## 2012-10-02 MED ORDER — FUROSEMIDE 40 MG PO TABS: 40.0000 mg | ORAL_TABLET | Freq: Every day | ORAL | Status: DC

## 2012-10-02 MED ORDER — GLUCOSE BLOOD VI STRP: ORAL_STRIP | Status: DC

## 2012-10-27 DIAGNOSIS — R609 Edema, unspecified: Secondary | ICD-10-CM | POA: Diagnosis not present

## 2012-10-27 DIAGNOSIS — M1710 Unilateral primary osteoarthritis, unspecified knee: Secondary | ICD-10-CM | POA: Diagnosis not present

## 2012-10-27 DIAGNOSIS — IMO0002 Reserved for concepts with insufficient information to code with codable children: Secondary | ICD-10-CM | POA: Diagnosis not present

## 2012-10-29 DIAGNOSIS — M1710 Unilateral primary osteoarthritis, unspecified knee: Secondary | ICD-10-CM | POA: Insufficient documentation

## 2012-10-29 DIAGNOSIS — M179 Osteoarthritis of knee, unspecified: Secondary | ICD-10-CM | POA: Insufficient documentation

## 2012-11-12 ENCOUNTER — Other Ambulatory Visit: Payer: Self-pay | Admitting: *Deleted

## 2012-11-12 MED ORDER — TOLTERODINE TARTRATE ER 4 MG PO CP24: ORAL_CAPSULE | ORAL | Status: DC

## 2012-11-12 MED ORDER — LEVOTHYROXINE SODIUM 25 MCG PO TABS: ORAL_TABLET | ORAL | Status: DC

## 2012-11-12 MED ORDER — PANTOPRAZOLE SODIUM 40 MG PO TBEC: DELAYED_RELEASE_TABLET | ORAL | Status: DC

## 2012-11-16 DIAGNOSIS — Q828 Other specified congenital malformations of skin: Secondary | ICD-10-CM | POA: Diagnosis not present

## 2012-11-16 DIAGNOSIS — B351 Tinea unguium: Secondary | ICD-10-CM | POA: Diagnosis not present

## 2012-11-16 DIAGNOSIS — M79609 Pain in unspecified limb: Secondary | ICD-10-CM | POA: Diagnosis not present

## 2012-12-11 ENCOUNTER — Other Ambulatory Visit: Payer: Medicare Other

## 2012-12-11 DIAGNOSIS — I10 Essential (primary) hypertension: Secondary | ICD-10-CM | POA: Diagnosis not present

## 2012-12-11 DIAGNOSIS — E039 Hypothyroidism, unspecified: Secondary | ICD-10-CM

## 2012-12-11 DIAGNOSIS — E785 Hyperlipidemia, unspecified: Secondary | ICD-10-CM | POA: Diagnosis not present

## 2012-12-11 DIAGNOSIS — E1159 Type 2 diabetes mellitus with other circulatory complications: Secondary | ICD-10-CM | POA: Diagnosis not present

## 2012-12-12 LAB — COMPREHENSIVE METABOLIC PANEL
ALT: 8 IU/L (ref 0–32)
Albumin: 4 g/dL (ref 3.5–4.7)
Alkaline Phosphatase: 75 IU/L (ref 39–117)
BUN/Creatinine Ratio: 25 (ref 11–26)
BUN: 18 mg/dL (ref 8–27)
CO2: 27 mmol/L (ref 18–29)
Chloride: 102 mmol/L (ref 97–108)
GFR calc Af Amer: 88 mL/min/{1.73_m2} (ref >59)
Glucose: 95 mg/dL (ref 65–99)
Potassium: 4.5 mmol/L (ref 3.5–5.2)
Total Bilirubin: 0.6 mg/dL (ref 0.0–1.2)

## 2012-12-12 LAB — HEMOGLOBIN A1C
Est. average glucose Bld gHb Est-mCnc: 140 mg/dL
Hgb A1c MFr Bld: 6.5 % — ABNORMAL HIGH (ref 4.8–5.6)

## 2012-12-12 LAB — TSH: TSH: 0.637 u[IU]/mL (ref 0.450–4.500)

## 2012-12-12 LAB — LIPID PANEL: Chol/HDL Ratio: 2.1 ratio units (ref 0.0–4.4)

## 2012-12-14 ENCOUNTER — Other Ambulatory Visit: Payer: Medicare Other

## 2012-12-16 ENCOUNTER — Ambulatory Visit (INDEPENDENT_AMBULATORY_CARE_PROVIDER_SITE_OTHER): Payer: Medicare Other | Admitting: Internal Medicine

## 2012-12-16 ENCOUNTER — Encounter: Payer: Self-pay | Admitting: Internal Medicine

## 2012-12-16 VITALS — BP 126/78 | HR 78 | Temp 97.5°F | Wt 152.0 lb

## 2012-12-16 DIAGNOSIS — I10 Essential (primary) hypertension: Secondary | ICD-10-CM

## 2012-12-16 DIAGNOSIS — R269 Unspecified abnormalities of gait and mobility: Secondary | ICD-10-CM

## 2012-12-16 DIAGNOSIS — R609 Edema, unspecified: Secondary | ICD-10-CM

## 2012-12-16 DIAGNOSIS — N6019 Diffuse cystic mastopathy of unspecified breast: Secondary | ICD-10-CM

## 2012-12-16 DIAGNOSIS — E1159 Type 2 diabetes mellitus with other circulatory complications: Secondary | ICD-10-CM | POA: Diagnosis not present

## 2012-12-16 DIAGNOSIS — E785 Hyperlipidemia, unspecified: Secondary | ICD-10-CM

## 2012-12-16 DIAGNOSIS — Z23 Encounter for immunization: Secondary | ICD-10-CM

## 2012-12-16 DIAGNOSIS — E039 Hypothyroidism, unspecified: Secondary | ICD-10-CM

## 2012-12-16 DIAGNOSIS — M25569 Pain in unspecified knee: Secondary | ICD-10-CM | POA: Insufficient documentation

## 2012-12-16 DIAGNOSIS — R32 Unspecified urinary incontinence: Secondary | ICD-10-CM

## 2012-12-16 LAB — HM MAMMOGRAPHY: HM Mammogram: NORMAL

## 2012-12-16 NOTE — Patient Instructions (Signed)
Continue medication as listed. 

## 2012-12-16 NOTE — Progress Notes (Signed)
Subjective:    Patient ID: Jessica Hawkins, female    DOB: April 02, 1922, 79 y.o.   MRN: [id number]  Chief Complaint  Patient presents with  . Medical Managment of Chronic Issues    3 month follow-up, discuss labs (copy printed)     HPI Type II or unspecified type diabetes mellitus with peripheral circulatory disorders, not stated as uncontrolled(250.70): controlled  HTN (hypertension): controlled  Other and unspecified hyperlipidemia: controlled  Edema:unchanged  Hypothyroid: now normal on supplement  Abnormality of gait: using walker regularly  Knee pain: increased. Not a good surgical candidate  Unspecified urinary incontinence: Detrol has been helpful  Need for prophylactic vaccination and inoculation against influenza      Current Outpatient Prescriptions on File Prior to Visit  Medication Sig Dispense Refill  . aspirin 81 MG tablet Take 81 mg by mouth daily.      Marland Kitchen BAYER MICROLET LANCETS lancets Use as instructed to check blood sugar once daily.250.00  100 each  12  . CALCIUM CARBONATE PO Take 1 tablet by mouth daily.      . furosemide (LASIX) 40 MG tablet Take 1 tablet (40 mg total) by mouth daily. Take one tablet by mouth once daily for edema  90 tablet  3  . glucose blood (BAYER CONTOUR TEST) test strip Use as instructed to check blood sugar once daily. 250.00  100 each  12  . levothyroxine (SYNTHROID) 25 MCG tablet Take one tablet by mouth daily for thyroid supplement.  90 tablet  3  . losartan (COZAAR) 50 MG tablet Take 1 tablet (50 mg total) by mouth daily. Take one tablet by mouth once daily for blood pressure  90 tablet  3  . Multiple Vitamins-Minerals (CENTRUM PO) Take 1 tablet by mouth daily.      . pantoprazole (PROTONIX) 40 MG tablet Take one tablet once daily for stomach  90 tablet  3  . tolterodine (DETROL LA) 4 MG 24 hr capsule Take one tablet once daily for bladder  90 capsule  3   No current facility-administered medications on file prior to visit.     Review of Systems  Constitutional: Negative for fever, chills, activity change, appetite change, fatigue and unexpected weight change.  HENT: Negative.   Eyes: Negative.   Respiratory: Negative.  Negative for cough and shortness of breath.   Cardiovascular: Positive for leg swelling. Negative for chest pain and palpitations.  Gastrointestinal: Negative for vomiting, diarrhea and abdominal distention.  Endocrine: Negative for cold intolerance, heat intolerance, polydipsia, polyphagia and polyuria.  Genitourinary:       Incontinent  Musculoskeletal: Positive for arthralgias, back pain, gait problem, joint swelling and myalgias.  Skin: Negative.   Neurological: Negative.   Hematological: Negative.   Psychiatric/Behavioral: Negative.        Objective:BP 126/78  Pulse 78  Temp(Src) 97.5 F (36.4 C) (Oral)  Wt 152 lb (68.947 kg)  BMI 31.78 kg/m2  SpO2 96%    Physical Exam  Constitutional: She is oriented to person, place, and time. She appears well-developed. No distress.  frail  HENT:  Head: Normocephalic and atraumatic.  Left Ear: External ear normal.  Nose: Nose normal.  Eyes: Conjunctivae and EOM are normal. Pupils are equal, round, and reactive to light.  Neck: Normal range of motion. Neck supple. No JVD present. No tracheal deviation present. No thyromegaly present.  Cardiovascular: Normal rate, regular rhythm, normal heart sounds and intact distal pulses.  Exam reveals no gallop and no friction rub.  No murmur heard. Varicose veins.  Pulmonary/Chest: No respiratory distress. She has no wheezes. She has no rales.  Abdominal: She exhibits no distension and no mass. There is no tenderness.  Musculoskeletal: She exhibits edema and tenderness.  Poor balance. Using walker.  Lymphadenopathy:    She has no cervical adenopathy.  Neurological: She is alert and oriented to person, place, and time. No cranial nerve deficit. Coordination normal.  Skin: No rash noted. No  erythema. No pallor.  Seborrheic keratoses  Psychiatric: She has a normal mood and affect. Her behavior is normal. Thought content normal.      Appointment on 12/11/2012  Component Date Value Range Status  . Glucose 12/11/2012 95  65 - 99 mg/dL Final  . BUN 13/10/6576 18  8 - 27 mg/dL Final  . Creatinine, Ser 12/11/2012 0.71  0.57 - 1.00 mg/dL Final  . GFR calc non Af Amer 12/11/2012 76  >59 mL/min/1.73 Final  . GFR calc Af Amer 12/11/2012 88  >59 mL/min/1.73 Final  . BUN/Creatinine Ratio 12/11/2012 25  11 - 26 Final  . Sodium 12/11/2012 143  134 - 144 mmol/L Final  . Potassium 12/11/2012 4.5  3.5 - 5.2 mmol/L Final  . Chloride 12/11/2012 102  97 - 108 mmol/L Final  . CO2 12/11/2012 27  18 - 29 mmol/L Final  . Calcium 12/11/2012 9.3  8.6 - 10.2 mg/dL Final  . Total Protein 12/11/2012 6.6  6.0 - 8.5 g/dL Final  . Albumin 46/96/2952 4.0  3.5 - 4.7 g/dL Final  . Globulin, Total 12/11/2012 2.6  1.5 - 4.5 g/dL Final  . Albumin/Globulin Ratio 12/11/2012 1.5  1.1 - 2.5 Final  . Total Bilirubin 12/11/2012 0.6  0.0 - 1.2 mg/dL Final  . Alkaline Phosphatase 12/11/2012 75  39 - 117 IU/L Final  . AST 12/11/2012 18  0 - 40 IU/L Final  . ALT 12/11/2012 8  0 - 32 IU/L Final  . Hemoglobin A1C 12/11/2012 6.5* 4.8 - 5.6 % Final   Comment:          Increased risk for diabetes: 5.7 - 6.4                                   Diabetes: >6.4                                   Glycemic control for adults with diabetes: <7.0  . Estimated average glucose 12/11/2012 140   Final  . Cholesterol, Total 12/11/2012 142  100 - 199 mg/dL Final  . Triglycerides 12/11/2012 46  0 - 149 mg/dL Final  . HDL 84/13/2440 67  >39 mg/dL Final   Comment: According to ATP-III Guidelines, HDL-C >59 mg/dL is considered a                          negative risk factor for CHD.  Marland Kitchen VLDL Cholesterol Cal 12/11/2012 9  5 - 40 mg/dL Final  . LDL Calculated 12/11/2012 66  0 - 99 mg/dL Final  . Chol/HDL Ratio 12/11/2012 2.1  0.0 - 4.4  ratio units Final   Comment:                                   T.  Chol/HDL Ratio                                                                      Men  Women                                                        1/2 Avg.Risk  3.4    3.3                                                            Avg.Risk  5.0    4.4                                                         2X Avg.Risk  9.6    7.1                                                         3X Avg.Risk 23.4   11.0  . TSH 12/11/2012 0.637  0.450 - 4.500 uIU/mL Final       Assessment & Plan:  Type II or unspecified type diabetes mellitus with peripheral circulatory disorders, not stated as uncontrolled(250.70): controlled  HTN (hypertension);controlled  Other and unspecified hyperlipidemia: controlled  Edema: unchanged  Hypothyroid: controlled  Abnormality of gait: continue use of walker  Knee pain: may be a candidate for hyaluronate injection  Unspecified urinary incontinence: continue with Detrol  Need for prophylactic vaccination and inoculation against influenza

## 2013-01-14 ENCOUNTER — Ambulatory Visit
Admission: RE | Admit: 2013-01-14 | Discharge: 2013-01-14 | Disposition: A | Discharge status: Home / Self Care | Payer: Medicare Other | Source: Ambulatory Visit | Attending: Internal Medicine | Admitting: Internal Medicine

## 2013-01-14 DIAGNOSIS — N6489 Other specified disorders of breast: Secondary | ICD-10-CM | POA: Diagnosis not present

## 2013-01-14 DIAGNOSIS — N6019 Diffuse cystic mastopathy of unspecified breast: Secondary | ICD-10-CM

## 2013-01-21 ENCOUNTER — Telehealth: Payer: Self-pay

## 2013-01-21 DIAGNOSIS — M1710 Unilateral primary osteoarthritis, unspecified knee: Secondary | ICD-10-CM | POA: Diagnosis not present

## 2013-01-21 NOTE — Telephone Encounter (Signed)
Patient seen Dr.Green in July 2014, Dr.Green recommended a specialist for knee, I reviewed encounter from July and did see the name of recommended specialist.   Dr.Green please advise on your recommendation for orthopedic specialist

## 2013-01-22 NOTE — Telephone Encounter (Signed)
Left message on VM for Jessica Hawkins to inform her that I have not heard a response from provider. I will f/u as soon as response available

## 2013-01-27 ENCOUNTER — Encounter: Payer: Self-pay | Admitting: Nurse Practitioner

## 2013-01-27 ENCOUNTER — Ambulatory Visit (INDEPENDENT_AMBULATORY_CARE_PROVIDER_SITE_OTHER): Payer: Medicare Other | Admitting: Nurse Practitioner

## 2013-01-27 ENCOUNTER — Ambulatory Visit (HOSPITAL_COMMUNITY)
Admission: RE | Admit: 2013-01-27 | Discharge: 2013-01-27 | Disposition: A | Discharge status: Home / Self Care | Payer: Medicare Other | Source: Ambulatory Visit | Attending: Nurse Practitioner | Admitting: Nurse Practitioner

## 2013-01-27 VITALS — BP 124/70 | HR 72 | Temp 97.5°F | Resp 16 | Wt 152.4 lb

## 2013-01-27 DIAGNOSIS — L03115 Cellulitis of right lower limb: Secondary | ICD-10-CM

## 2013-01-27 DIAGNOSIS — M7989 Other specified soft tissue disorders: Secondary | ICD-10-CM

## 2013-01-27 DIAGNOSIS — L02419 Cutaneous abscess of limb, unspecified: Secondary | ICD-10-CM

## 2013-01-27 DIAGNOSIS — R609 Edema, unspecified: Secondary | ICD-10-CM

## 2013-01-27 MED ORDER — DOXYCYCLINE HYCLATE 100 MG PO TABS: 100.0000 mg | ORAL_TABLET | Freq: Two times a day (BID) | ORAL | Status: DC

## 2013-01-27 NOTE — Progress Notes (Signed)
VASCULAR LAB PRELIMINARY  PRELIMINARY  PRELIMINARY  PRELIMINARY  Right lower extremity venous duplex completed.    Preliminary report:  Right:  No evidence of DVT, superficial thrombosis, or Baker's cyst.  Prominent lymph node noted.  Intertstitial fluid noted in the calf.  Neidra Girvan, RVT 01/27/2013, 5:55 PM

## 2013-01-27 NOTE — Patient Instructions (Signed)
-  To keep leg elevated above the level of the heart as much as possible  -increase lasix to twice daily for 3 days (in the am and 12 noon)  -doxycyline to pharmacy to take by mouth twice daily for 10 days for cellulitis  -will get doppler to rule out DVT of right leg  -follow up in office on Monday or Tuesday of next week   Cellulitis Cellulitis is an infection of the skin and the tissue beneath it. The infected area is usually red and tender. Cellulitis occurs most often in the arms and lower legs.  CAUSES  Cellulitis is caused by bacteria that enter the skin through cracks or cuts in the skin. The most common types of bacteria that cause cellulitis are Staphylococcus and Streptococcus. SYMPTOMS   Redness and warmth.  Swelling.  Tenderness or pain.  Fever. DIAGNOSIS  Your caregiver can usually determine what is wrong based on a physical exam. Blood tests may also be done. TREATMENT  Treatment usually involves taking an antibiotic medicine. HOME CARE INSTRUCTIONS   Take your antibiotics as directed. Finish them even if you start to feel better.  Keep the infected arm or leg elevated to reduce swelling.  Apply a warm cloth to the affected area up to 4 times per day to relieve pain.  Only take over-the-counter or prescription medicines for pain, discomfort, or fever as directed by your caregiver.  Keep all follow-up appointments as directed by your caregiver. SEEK MEDICAL CARE IF:   You notice red streaks coming from the infected area.  Your red area gets larger or turns dark in color.  Your bone or joint underneath the infected area becomes painful after the skin has healed.  Your infection returns in the same area or another area.  You notice a swollen bump in the infected area.  You develop new symptoms. SEEK IMMEDIATE MEDICAL CARE IF:   You have a fever.  You feel very sleepy.  You develop vomiting or diarrhea.  You have a general ill feeling (malaise)  with muscle aches and pains.  If area gets bigger MAKE SURE YOU:   Understand these instructions.  Will watch your condition.  Will get help right away if you are not doing well or get worse. Document Released: 11/28/2004 Document Revised: 08/20/2011 Document Reviewed: 05/06/2011 [facility] Patient Information 2014 [place], [place].

## 2013-01-27 NOTE — Progress Notes (Signed)
Patient ID: Jessica Hawkins, female   DOB: 09-01-1922, 79 y.o.   MRN: [id number]    Allergies  Allergen Reactions  . Cabbage     unknown    Chief Complaint  Patient presents with  . Acute Visit    swelling & sore on the RT leg x 1 week     HPI: Patient is a 79 y.o. female seen in the office today for increase in swelling in right leg; was not taking lasix as prescribed but for the past week she has taken it daily About 1 week ago started noticing her right leg swelling, sores appeared the next day, and redness has been going on for 4 days  No fevers or chill, good appetite, feels good overall Putting neosporin on wounds on leg  Review of Systems:  Review of Systems  Constitutional: Negative for fever, chills and malaise/fatigue.  Respiratory: Negative for shortness of breath.   Cardiovascular: Positive for leg swelling. Negative for chest pain.  Gastrointestinal: Negative for abdominal pain, constipation and blood in stool.  Musculoskeletal: Negative for myalgias.  Skin:       Open areas on right lateral leg  Neurological: Negative for weakness.     Past Medical History  Diagnosis Date  . Hypertension   . Osteoporosis   . GERD (gastroesophageal reflux disease) 06/29/2011  . Hypothyroid 06/29/2011  . HTN (hypertension) 06/29/2011  . Platelet disorder     ? Von Willebrand  . Hyperlipidemia   . Diabetes mellitus without complication   . Edema   . BBB (bundle branch block)     RT  . Hiatal hernia   . Fibrocystic breast   . Arthritis     Osteoarthritis  . Vertigo   . Insomnia   . Gait disorder   . Unspecified constipation   . Disturbance of skin sensation   . Unspecified vitamin D deficiency   . Unspecified urinary incontinence   . Unspecified sleep apnea   . Coronary atherosclerosis of unspecified type of vessel, native or graft   . Diaphragmatic hernia without mention of obstruction or gangrene   . Debility, unspecified    Past Surgical History  Procedure  Laterality Date  . Hip surgery Right 2002    ORIF  . Thyroidectomy  1952  . Tonsillectomy    . Femur im nail  06/29/2011    Procedure: INTRAMEDULLARY (IM) NAIL FEMORAL;  Surgeon: Verlee Rossetti, MD;  Location: [facility] OR;  Service: Orthopedics;  Laterality: Left;  . Rotator cuff repair Left 1999    Tear  . Abdominal hysterectomy    . Eye surgery Right 2005    Cataract surgery  . Eye surgery Left 2006    Cataract surgery   Social History:   reports that she has never smoked. She does not have any smokeless tobacco history on file. She reports that she does not drink alcohol or use illicit drugs.  Family History  Problem Relation Age of Onset  . Cancer Mother     Stomach  . Cancer Father     Prostate  . Emphysema Father   . Alcohol abuse Sister   . Liver disease Sister   . Kidney disease Brother   . Hypertension Daughter   . Cancer Brother     Prostate, Bladder  . Emphysema Brother   . Cancer Brother     Prostate  . Arthritis Daughter     Knees  . Heart murmur Son   . Mental illness Son  Autism    Medications: Patient's Medications  New Prescriptions   No medications on file  Previous Medications   ASPIRIN 81 MG TABLET    Take 81 mg by mouth daily.   BAYER MICROLET LANCETS LANCETS    Use as instructed to check blood sugar once daily.250.00   CALCIUM CARBONATE PO    Take 1 tablet by mouth daily.   FUROSEMIDE (LASIX) 40 MG TABLET    Take 1 tablet (40 mg total) by mouth daily. Take one tablet by mouth once daily for edema   GLUCOSE BLOOD (BAYER CONTOUR TEST) TEST STRIP    Use as instructed to check blood sugar once daily. 250.00   LEVOTHYROXINE (SYNTHROID) 25 MCG TABLET    Take one tablet by mouth daily for thyroid supplement.   LOSARTAN (COZAAR) 50 MG TABLET    Take 1 tablet (50 mg total) by mouth daily. Take one tablet by mouth once daily for blood pressure   MULTIPLE VITAMINS-MINERALS (CENTRUM PO)    Take 1 tablet by mouth daily.   PANTOPRAZOLE (PROTONIX) 40 MG  TABLET    Take one tablet once daily for stomach   TOLTERODINE (DETROL LA) 4 MG 24 HR CAPSULE    Take one tablet once daily for bladder  Modified Medications   No medications on file  Discontinued Medications   No medications on file     Physical Exam:  Filed Vitals:   01/27/13 1507  BP: 124/70  Pulse: 72  Temp: 97.5 F (36.4 C)  TempSrc: Oral  Resp: 16  Weight: 152 lb 6.4 oz (69.128 kg)  SpO2: 99%    Physical Exam  Constitutional: She is well-developed, well-nourished, and in no distress. No distress.  Cardiovascular: Normal rate and regular rhythm.   Murmur heard. Pulmonary/Chest: Effort normal and breath sounds normal. No respiratory distress.  Abdominal: Soft. Bowel sounds are normal. She exhibits no distension.  Musculoskeletal: She exhibits edema (2+ edema on left , 3+ on right with redness on right from ankel to below [place]).  Neurological: She is alert.  Skin: Skin is warm. She is not diaphoretic. There is erythema (to right lower leg with 2 open areas 0.5 x 0.5  cm).  Psychiatric: Affect normal.     Labs reviewed: Basic Metabolic Panel:  Recent Labs  16/10/96 1648 08/25/12 1619 12/11/12 1057  NA 141 136 143  K 4.2 4.6 4.5  CL 100 99 102  CO2 26 29 27   GLUCOSE 99 126* 95  BUN 18 14 18   CREATININE 0.74 0.67 0.71  CALCIUM 9.8 9.7 9.3  TSH  --   --  0.637   Liver Function Tests:  Recent Labs  08/24/12 1648 08/25/12 1619 12/11/12 1057  AST 18 18 18   ALT 8 9 8   ALKPHOS 85 80 75  BILITOT 0.5 0.7 0.6  PROT 7.4 7.7 6.6  ALBUMIN  --  3.7  --    No results found for this basename: LIPASE, AMYLASE,  in the last 8760 hours No results found for this basename: AMMONIA,  in the last 8760 hours CBC:  Recent Labs  08/24/12 1648 08/25/12 1619  WBC 5.7 7.0  NEUTROABS 3.0 5.8  HGB 11.9 12.1  HCT 35.5 37.1  MCV 81 85.1  PLT  --  176   Lipid Panel:  Recent Labs  09/03/12 1155 12/11/12 1057  HDL 66 67  LDLCALC 84 66  TRIG 54 46  CHOLHDL  2.4 2.1   TSH:  Recent Labs  12/11/12  1057  TSH 0.637   A1C: No components found with this basename: A1C,    Assessment/Plan 1. Cellulitis of leg, right -to keep leg elevated above heart -monitor sores, keep clean and dry -may use bandage to keep clean but change daily and not to let wet dressing stay on skin -will increase lasix to 40 mg twice daily for 3 days then  - Lower Extremity Venous Duplex Right; r/o DVT - doxycycline (VIBRA-TABS) 100 MG tablet; Take 1 tablet (100 mg total) by mouth 2 (two) times daily.  Dispense: 20 tablet; Refill: 0 -to follow up on Monday in office for further evaluation  - given warning signs on when to seek emergency care

## 2013-02-01 ENCOUNTER — Ambulatory Visit (INDEPENDENT_AMBULATORY_CARE_PROVIDER_SITE_OTHER): Payer: Medicare Other | Admitting: Internal Medicine

## 2013-02-01 ENCOUNTER — Encounter: Payer: Self-pay | Admitting: Internal Medicine

## 2013-02-01 VITALS — BP 134/68 | HR 64 | Temp 97.5°F | Wt 151.0 lb

## 2013-02-01 DIAGNOSIS — L02419 Cutaneous abscess of limb, unspecified: Secondary | ICD-10-CM

## 2013-02-01 DIAGNOSIS — I872 Venous insufficiency (chronic) (peripheral): Secondary | ICD-10-CM | POA: Diagnosis not present

## 2013-02-01 DIAGNOSIS — IMO0001 Reserved for inherently not codable concepts without codable children: Secondary | ICD-10-CM

## 2013-02-01 DIAGNOSIS — L97909 Non-pressure chronic ulcer of unspecified part of unspecified lower leg with unspecified severity: Secondary | ICD-10-CM

## 2013-02-01 DIAGNOSIS — L03119 Cellulitis of unspecified part of limb: Secondary | ICD-10-CM | POA: Diagnosis not present

## 2013-02-01 DIAGNOSIS — L03115 Cellulitis of right lower limb: Secondary | ICD-10-CM

## 2013-02-01 DIAGNOSIS — I83009 Varicose veins of unspecified lower extremity with ulcer of unspecified site: Secondary | ICD-10-CM

## 2013-02-01 NOTE — Telephone Encounter (Signed)
Dr. Ranell Patrick or [facility] with Tomasita Crumble, or Dr. Dorthula Nettles.

## 2013-02-01 NOTE — Patient Instructions (Addendum)
Continue lasix twice a day  We will check your potassium level today  I will get home health to come out to tend to your ulcers and wrap your legs AFTER we get your arterial circulation checked (another ultrasound test)--we have to make sure we won't cause a blockage in circulation to your feet by wrapping them.

## 2013-02-01 NOTE — Telephone Encounter (Signed)
Discussed with patients daughter.

## 2013-02-01 NOTE — Progress Notes (Signed)
Patient ID: Jessica Hawkins, female   DOB: 01-30-1923, 79 y.o.   MRN: [id number]   Location:  [facility]  Allergies  Allergen Reactions  . Cabbage     unknown    Chief Complaint  Patient presents with  . Follow-up    5 day f/u Cellulititis/Edema on RT lower leg.    HPI: Patient is a 79 y.o. black female seen in the office today for 5 day follow up on right LE cellulitis and edema.  She notes improvement and MA notes much less redness and swelling of leg though swelling remains prominent.  Pt has three ulcerated areas still present on lateral aspect of right lower leg.  She has been applying neosporin to them and wearing her diabetic socks  Takes lasix twice daily only since 11/26.  Had previously been skipping her regular lasix when she went out.  Reviewed importance of taking lasix to get rid of swelling.  Has not missed since the bid therapy was started.  Weight down only 1.5lbs, but says she has been eating quite a bit with the thanksgiving holiday.    Wanted to be measured for compression hose today, but not at her baseline weight or calf size, plus we don't know the status of her arterial circulation so deferred this.  Review of Systems:  Review of Systems  Constitutional: Negative for fever and chills.       Down 1.5lbs since 11/26  HENT: Negative for congestion.   Respiratory: Negative for cough and shortness of breath.   Cardiovascular: Positive for leg swelling. Negative for chest pain, palpitations, orthopnea and PND.       Slight improvement in swelling  Gastrointestinal: Negative for abdominal pain, blood in stool and melena.  Genitourinary: Positive for frequency. Negative for dysuria.       With lasix 40mg  po bid  Musculoskeletal: Positive for joint pain. Negative for falls.       Hip  Skin:       3 ulcers on right leg, mild redness  Neurological: Negative for dizziness, loss of consciousness and weakness.     Past  Medical History  Diagnosis Date  . Hypertension   . Osteoporosis   . GERD (gastroesophageal reflux disease) 06/29/2011  . Hypothyroid 06/29/2011  . HTN (hypertension) 06/29/2011  . Platelet disorder     ? Von Willebrand  . Hyperlipidemia   . Diabetes mellitus without complication   . Edema   . BBB (bundle branch block)     RT  . Hiatal hernia   . Fibrocystic breast   . Arthritis     Osteoarthritis  . Vertigo   . Insomnia   . Gait disorder   . Unspecified constipation   . Disturbance of skin sensation   . Unspecified vitamin D deficiency   . Unspecified urinary incontinence   . Unspecified sleep apnea   . Coronary atherosclerosis of unspecified type of vessel, native or graft   . Diaphragmatic hernia without mention of obstruction or gangrene   . Debility, unspecified     Past Surgical History  Procedure Laterality Date  . Hip surgery Right 2002    ORIF  . Thyroidectomy  1952  . Tonsillectomy    . Femur im nail  06/29/2011    Procedure: INTRAMEDULLARY (IM) NAIL FEMORAL;  Surgeon: Verlee Rossetti, MD;  Location: [facility] OR;  Service: Orthopedics;  Laterality: Left;  . Rotator cuff repair Left 1999    Tear  .  Abdominal hysterectomy    . Eye surgery Right 2005    Cataract surgery  . Eye surgery Left 2006    Cataract surgery    Social History:   reports that she has never smoked. She does not have any smokeless tobacco history on file. She reports that she does not drink alcohol or use illicit drugs.  Family History  Problem Relation Age of Onset  . Cancer Mother     Stomach  . Cancer Father     Prostate  . Emphysema Father   . Alcohol abuse Sister   . Liver disease Sister   . Kidney disease Brother   . Hypertension Daughter   . Cancer Brother     Prostate, Bladder  . Emphysema Brother   . Cancer Brother     Prostate  . Arthritis Daughter     Knees  . Heart murmur Son   . Mental illness Son     Autism    Medications: Patient's Medications  New  Prescriptions   No medications on file  Previous Medications   ASPIRIN 81 MG TABLET    Take 81 mg by mouth daily.   BAYER MICROLET LANCETS LANCETS    Use as instructed to check blood sugar once daily.250.00   CALCIUM CARBONATE PO    Take 1 tablet by mouth daily.   DOXYCYCLINE (VIBRA-TABS) 100 MG TABLET    Take 1 tablet (100 mg total) by mouth 2 (two) times daily.   FUROSEMIDE (LASIX) 40 MG TABLET    Take 1 tablet (40 mg total) by mouth daily. Take one tablet by mouth once daily for edema   GLUCOSE BLOOD (BAYER CONTOUR TEST) TEST STRIP    Use as instructed to check blood sugar once daily. 250.00   LEVOTHYROXINE (SYNTHROID) 25 MCG TABLET    Take one tablet by mouth daily for thyroid supplement.   LOSARTAN (COZAAR) 50 MG TABLET    Take 1 tablet (50 mg total) by mouth daily. Take one tablet by mouth once daily for blood pressure   MULTIPLE VITAMINS-MINERALS (CENTRUM PO)    Take 1 tablet by mouth daily.   PANTOPRAZOLE (PROTONIX) 40 MG TABLET    Take one tablet once daily for stomach   TOLTERODINE (DETROL LA) 4 MG 24 HR CAPSULE    Take one tablet once daily for bladder  Modified Medications   No medications on file  Discontinued Medications   No medications on file     Physical Exam: Filed Vitals:   02/01/13 1451  BP: 134/68  Pulse: 64  Temp: 97.5 F (36.4 C)  TempSrc: Oral  Weight: 151 lb (68.493 kg)  SpO2: 98%  Physical Exam  Constitutional: She is oriented to person, place, and time.  Obese black female, ambulates very slowly with rollator walker with limp  HENT:  Head: Normocephalic and atraumatic.  Cardiovascular: Normal rate and intact distal pulses.   Murmur heard. 3+ edema right, 2+ left  Pulmonary/Chest: Effort normal and breath sounds normal. No respiratory distress. She has no rales.  Abdominal: Soft. Bowel sounds are normal. She exhibits no distension. There is no tenderness.  Neurological: She is alert and oriented to person, place, and time.  Skin: Skin is warm and  dry.  Mild erythema surrounding 3 less than 1cm ulcers on right lateral leg, 2 are open with pink tissue exposed, one with eschar over it    Labs reviewed: Basic Metabolic Panel:  Recent Labs  16/10/96 1648 08/25/12 1619 12/11/12 1057  NA 141 136 143  K 4.2 4.6 4.5  CL 100 99 102  CO2 26 29 27   GLUCOSE 99 126* 95  BUN 18 14 18   CREATININE 0.74 0.67 0.71  CALCIUM 9.8 9.7 9.3  TSH  --   --  0.637   Liver Function Tests:  Recent Labs  08/24/12 1648 08/25/12 1619 12/11/12 1057  AST 18 18 18   ALT 8 9 8   ALKPHOS 85 80 75  BILITOT 0.5 0.7 0.6  PROT 7.4 7.7 6.6  ALBUMIN  --  3.7  --   CBC:  Recent Labs  08/24/12 1648 08/25/12 1619  WBC 5.7 7.0  NEUTROABS 3.0 5.8  HGB 11.9 12.1  HCT 35.5 37.1  MCV 81 85.1  PLT  --  176   Lipid Panel:  Recent Labs  09/03/12 1155 12/11/12 1057  HDL 66 67  LDLCALC 84 66  TRIG 54 46  CHOLHDL 2.4 2.1   Lab Results  Component Value Date   HGBA1C 6.5* 12/11/2012    Past Procedures: 01/27/13 Venous doppler:  - No evidence of deep vein thrombosis involving the right lower extremity. Prominent lymph node in right groin. Severe interstitial fluid noted in the right calf.  Assessment/Plan 1. Cellulitis of leg, right -finish abx (doxycycline) as ordered--appears cellulitis is responding nicely to this  2. Venous insufficiency (chronic) (peripheral) - appears to be cause of her edema--has chronic venous stasis skin changes present and has now developed 3 ulcerations that were likely source of her cellulitis - -should ideally use compression hose in the long term -need assessment of her arterial circulation before unnaboots can be applied to help with edema and prevent further ulcerations - Ankle brachial index; Future - Lower Extremity Arterial Doppler Bilateral; Future - Basic Metabolic Panel to check K and cr with lasix 40mg  po bid--cont this and f/u in 1 wk to reassess edema, weight  3. Venous stasis ulcer, right -may  continue to apply neosporin on the 2 open areas, one is closed -plan on dressings and unnaboots by home health if arterial circulation is not compromised  Labs/tests ordered:  Bmp, arterial doppler with abis Next appt:  1 week with me or Shanda Bumps

## 2013-02-02 DIAGNOSIS — I872 Venous insufficiency (chronic) (peripheral): Secondary | ICD-10-CM | POA: Insufficient documentation

## 2013-02-02 DIAGNOSIS — I83009 Varicose veins of unspecified lower extremity with ulcer of unspecified site: Secondary | ICD-10-CM | POA: Insufficient documentation

## 2013-02-02 DIAGNOSIS — L97909 Non-pressure chronic ulcer of unspecified part of unspecified lower leg with unspecified severity: Secondary | ICD-10-CM | POA: Insufficient documentation

## 2013-02-02 LAB — BASIC METABOLIC PANEL
BUN/Creatinine Ratio: 40 — ABNORMAL HIGH (ref 11–26)
BUN: 34 mg/dL — ABNORMAL HIGH (ref 8–27)
CO2: 27 mmol/L (ref 18–29)
Calcium: 9.5 mg/dL (ref 8.6–10.2)
Chloride: 99 mmol/L (ref 97–108)
Creatinine, Ser: 0.85 mg/dL (ref 0.57–1.00)
GFR calc Af Amer: 71 mL/min/{1.73_m2} (ref >59)
GFR calc non Af Amer: 61 mL/min/{1.73_m2} (ref >59)
Glucose: 167 mg/dL — ABNORMAL HIGH (ref 65–99)
Potassium: 4.3 mmol/L (ref 3.5–5.2)
Sodium: 141 mmol/L (ref 134–144)

## 2013-02-04 NOTE — Progress Notes (Signed)
Patient ID: Jessica Hawkins, female   DOB: May 07, 1922, 79 y.o.   MRN: [id number] Pt has pain in her bilateral lower extremities especially surrounding the three ulcers on her right leg.

## 2013-02-05 ENCOUNTER — Other Ambulatory Visit: Payer: Self-pay | Admitting: Internal Medicine

## 2013-02-08 ENCOUNTER — Ambulatory Visit (HOSPITAL_COMMUNITY)
Admission: RE | Admit: 2013-02-08 | Discharge: 2013-02-08 | Disposition: A | Discharge status: Home / Self Care | Payer: Medicare Other | Source: Ambulatory Visit | Attending: Internal Medicine | Admitting: Internal Medicine

## 2013-02-08 ENCOUNTER — Other Ambulatory Visit: Payer: Self-pay | Admitting: Internal Medicine

## 2013-02-08 ENCOUNTER — Ambulatory Visit (HOSPITAL_COMMUNITY)
Admission: RE | Admit: 2013-02-08 | Discharge: 2013-02-08 | Disposition: A | Discharge status: Home / Self Care | Payer: Medicare Other | Source: Ambulatory Visit | Attending: Vascular Surgery | Admitting: Vascular Surgery

## 2013-02-08 ENCOUNTER — Ambulatory Visit (HOSPITAL_COMMUNITY)
Admission: RE | Admit: 2013-02-08 | Discharge: 2013-02-08 | Disposition: A | Discharge status: Home / Self Care | Payer: Medicare Other | Source: Ambulatory Visit | Attending: Surgery | Admitting: Surgery

## 2013-02-08 DIAGNOSIS — L97909 Non-pressure chronic ulcer of unspecified part of unspecified lower leg with unspecified severity: Secondary | ICD-10-CM | POA: Insufficient documentation

## 2013-02-08 DIAGNOSIS — I739 Peripheral vascular disease, unspecified: Secondary | ICD-10-CM | POA: Insufficient documentation

## 2013-02-09 ENCOUNTER — Other Ambulatory Visit: Payer: Self-pay | Admitting: Internal Medicine

## 2013-02-09 DIAGNOSIS — L97909 Non-pressure chronic ulcer of unspecified part of unspecified lower leg with unspecified severity: Secondary | ICD-10-CM

## 2013-02-10 ENCOUNTER — Other Ambulatory Visit: Payer: Self-pay | Admitting: Internal Medicine

## 2013-02-10 DIAGNOSIS — I739 Peripheral vascular disease, unspecified: Secondary | ICD-10-CM

## 2013-02-11 ENCOUNTER — Ambulatory Visit: Payer: Medicare Other | Admitting: Nurse Practitioner

## 2013-02-15 ENCOUNTER — Encounter: Payer: Self-pay | Admitting: Podiatry

## 2013-02-15 ENCOUNTER — Ambulatory Visit (INDEPENDENT_AMBULATORY_CARE_PROVIDER_SITE_OTHER): Payer: Medicare Other | Admitting: Podiatry

## 2013-02-15 VITALS — BP 119/58 | HR 68 | Resp 16 | Ht 63.0 in | Wt 165.0 lb

## 2013-02-15 DIAGNOSIS — M79609 Pain in unspecified limb: Secondary | ICD-10-CM | POA: Diagnosis not present

## 2013-02-15 DIAGNOSIS — Q828 Other specified congenital malformations of skin: Secondary | ICD-10-CM | POA: Diagnosis not present

## 2013-02-15 DIAGNOSIS — B351 Tinea unguium: Secondary | ICD-10-CM

## 2013-02-15 NOTE — Progress Notes (Signed)
Patient ID: Jessica Hawkins, female   DOB: [birth year], 79 y.o.   MRN: [id number] Subjective: This 79 year old black female known type II diabetic presents for ongoing debridement of painful mycotic toenails and keratoses. She has been a patient in this practice since 2007.  Objective: Brittle, hypertrophic, discolored, toenails x10 with palpable tenderness in all 10 nail plates. Hemorrhagic keratoses distal third right toe.  Assessment: Symptomatic onychomycoses x10 Painful hyperkeratotic lesion distal third right  Plan: Nails x10 debrided back without any bleeding. Keratoses third right debrided back without any bleeding. Reappoint at three-month intervals.

## 2013-02-18 ENCOUNTER — Ambulatory Visit (INDEPENDENT_AMBULATORY_CARE_PROVIDER_SITE_OTHER): Payer: Medicare Other | Admitting: Nurse Practitioner

## 2013-02-18 ENCOUNTER — Encounter: Payer: Self-pay | Admitting: Nurse Practitioner

## 2013-02-18 VITALS — BP 116/64 | HR 65 | Temp 97.6°F | Resp 16 | Wt 165.2 lb

## 2013-02-18 DIAGNOSIS — K5900 Constipation, unspecified: Secondary | ICD-10-CM | POA: Diagnosis not present

## 2013-02-18 DIAGNOSIS — L0291 Cutaneous abscess, unspecified: Secondary | ICD-10-CM | POA: Diagnosis not present

## 2013-02-18 DIAGNOSIS — L0390 Cellulitis, unspecified: Secondary | ICD-10-CM

## 2013-02-18 DIAGNOSIS — I10 Essential (primary) hypertension: Secondary | ICD-10-CM | POA: Diagnosis not present

## 2013-02-18 DIAGNOSIS — R609 Edema, unspecified: Secondary | ICD-10-CM

## 2013-02-18 DIAGNOSIS — E1159 Type 2 diabetes mellitus with other circulatory complications: Secondary | ICD-10-CM | POA: Diagnosis not present

## 2013-02-18 NOTE — Progress Notes (Signed)
Patient ID: Jessica Hawkins, female   DOB: 09/02/1922, 79 y.o.   MRN: [id number]    Allergies  Allergen Reactions  . Cabbage     unknown    Chief Complaint  Patient presents with  . Follow-up    1 week f/u    HPI: Patient is a 79 y.o. female seen in the office today for follow up on cellulitis to right leg; finish course of doxycyline;  Was taking lasix 2 times daily to help with edema ( RX was for once daily and she was skipping pills frequently before she was diagnosed with cellulitis); no pain in leg, no fever or chills; no new sores  No new concens Review of Systems:  Review of Systems  Constitutional: Negative for fever and chills.  HENT: Negative for congestion.   Respiratory: Negative for cough and shortness of breath.   Cardiovascular: Positive for leg swelling (improved). Negative for chest pain, palpitations, orthopnea and PND.  Gastrointestinal: Positive for constipation. Negative for abdominal pain, blood in stool and melena.  Genitourinary: Negative for dysuria and frequency.  Musculoskeletal: Positive for joint pain. Negative for falls.       Hip  Skin:       scabbed areas on right leg  Neurological: Negative for dizziness, loss of consciousness and weakness.     Past Medical History  Diagnosis Date  . Hypertension   . Osteoporosis   . GERD (gastroesophageal reflux disease) 06/29/2011  . Hypothyroid 06/29/2011  . HTN (hypertension) 06/29/2011  . Platelet disorder     ? Von Willebrand  . Hyperlipidemia   . Diabetes mellitus without complication   . Edema   . BBB (bundle branch block)     RT  . Hiatal hernia   . Fibrocystic breast   . Arthritis     Osteoarthritis  . Vertigo   . Insomnia   . Gait disorder   . Unspecified constipation   . Disturbance of skin sensation   . Unspecified vitamin D deficiency   . Unspecified urinary incontinence   . Unspecified sleep apnea   . Coronary atherosclerosis of unspecified type of vessel, native or graft   .  Diaphragmatic hernia without mention of obstruction or gangrene   . Debility, unspecified    Past Surgical History  Procedure Laterality Date  . Hip surgery Right 2002    ORIF  . Thyroidectomy  1952  . Tonsillectomy    . Femur im nail  06/29/2011    Procedure: INTRAMEDULLARY (IM) NAIL FEMORAL;  Surgeon: Verlee Rossetti, MD;  Location: [facility];  Service: Orthopedics;  Laterality: Left;  . Rotator cuff repair Left 1999    Tear  . Abdominal hysterectomy    . Eye surgery Right 2005    Cataract surgery  . Eye surgery Left 2006    Cataract surgery   Social History:   reports that she has never smoked. She has never used smokeless tobacco. She reports that she does not drink alcohol or use illicit drugs.  Family History  Problem Relation Age of Onset  . Cancer Mother     Stomach  . Cancer Father     Prostate  . Emphysema Father   . Alcohol abuse Sister   . Liver disease Sister   . Kidney disease Brother   . Hypertension Daughter   . Cancer Brother     Prostate, Bladder  . Emphysema Brother   . Cancer Brother     Prostate  . Arthritis Daughter  Knees  . Heart murmur Son   . Mental illness Son     Autism    Medications: Patient's Medications  New Prescriptions   No medications on file  Previous Medications   ASPIRIN 81 MG TABLET    Take 81 mg by mouth daily.   BAYER MICROLET LANCETS LANCETS    Use as instructed to check blood sugar once daily.250.00   CALCIUM CARBONATE PO    Take 1 tablet by mouth daily.   FUROSEMIDE (LASIX) 40 MG TABLET    Take 1 tablet (40 mg total) by mouth daily. Take one tablet by mouth once daily for edema   GLUCOSE BLOOD (BAYER CONTOUR TEST) TEST STRIP    Use as instructed to check blood sugar once daily. 250.00   LEVOTHYROXINE (SYNTHROID) 25 MCG TABLET    Take one tablet by mouth daily for thyroid supplement.   LOSARTAN (COZAAR) 50 MG TABLET    TAKE 1 TABLET DAILY   MULTIPLE VITAMINS-MINERALS (CENTRUM PO)    Take 1 tablet by mouth daily.    PANTOPRAZOLE (PROTONIX) 40 MG TABLET    Take one tablet once daily for stomach   TOLTERODINE (DETROL LA) 4 MG 24 HR CAPSULE    Take one tablet once daily for bladder  Modified Medications   No medications on file  Discontinued Medications   DOXYCYCLINE (VIBRA-TABS) 100 MG TABLET    Take 1 tablet (100 mg total) by mouth 2 (two) times daily.     Physical Exam:  Filed Vitals:   02/18/13 1439  BP: 116/64  Pulse: 65  Temp: 97.6 F (36.4 C)  TempSrc: Oral  Resp: 16  Weight: 165 lb 3.2 oz (74.934 kg)  SpO2: 97%    Physical Exam  Constitutional: She is well-developed, well-nourished, and in no distress. No distress.  Cardiovascular: Normal rate and regular rhythm.   Murmur heard. Pulmonary/Chest: Effort normal and breath sounds normal. No respiratory distress.  Abdominal: Soft. Bowel sounds are normal. She exhibits no distension.  Musculoskeletal: She exhibits edema (+1 bilaterally).  Neurological: She is alert.  Skin: Skin is warm and dry. She is not diaphoretic.  Psychiatric: Affect normal.    Labs reviewed: Basic Metabolic Panel:  Recent Labs  40/98/11 1619 12/11/12 1057 02/01/13 1537  NA 136 143 141  K 4.6 4.5 4.3  CL 99 102 99  CO2 29 27 27   GLUCOSE 126* 95 167*  BUN 14 18 34*  CREATININE 0.67 0.71 0.85  CALCIUM 9.7 9.3 9.5  TSH  --  0.637  --    Liver Function Tests:  Recent Labs  08/24/12 1648 08/25/12 1619 12/11/12 1057  AST 18 18 18   ALT 8 9 8   ALKPHOS 85 80 75  BILITOT 0.5 0.7 0.6  PROT 7.4 7.7 6.6  ALBUMIN  --  3.7  --    No results found for this basename: LIPASE, AMYLASE,  in the last 8760 hours No results found for this basename: AMMONIA,  in the last 8760 hours CBC:  Recent Labs  08/24/12 1648 08/25/12 1619  WBC 5.7 7.0  NEUTROABS 3.0 5.8  HGB 11.9 12.1  HCT 35.5 37.1  MCV 81 85.1  PLT  --  176   Lipid Panel:  Recent Labs  09/03/12 1155 12/11/12 1057  HDL 66 67  LDLCALC 84 66  TRIG 54 46  CHOLHDL 2.4 2.1    TSH:  Recent Labs  12/11/12 1057  TSH 0.637   A1C: No components found with this basename:  A1C,    Assessment/Plan 1. Cellulitis -improved; no further treatment needed  2. Edema -significantly improved -will have pt start taking her maintenance dose of lasix 40 mg daily  - TED hose to wear during the day and take off at night - Basic metabolic panel  3. Unspecified constipation -given information on constipation -to wait until she has the urge and then not wait -increase fiber intake

## 2013-02-18 NOTE — Patient Instructions (Addendum)
Keep follow up with Dr Chilton Si  Lasix 40 mg once daily -fluid pill   To get TED hose; to put on in the morning and then take off at night  Constipation, Adult Constipation is when a person has fewer than 3 bowel movements a week; has difficulty having a bowel movement; or has stools that are dry, hard, or larger than normal. As people grow older, constipation is more common. If you try to fix constipation with medicines that make you have a bowel movement (laxatives), the problem may get worse. Long-term laxative use may cause the muscles of the colon to become weak. A low-fiber diet, not taking in enough fluids, and taking certain medicines may make constipation worse. CAUSES   Certain medicines, such as antidepressants, pain medicine, iron supplements, antacids, and water pills.   Certain diseases, such as diabetes, irritable bowel syndrome (IBS), thyroid disease, or depression.   Not drinking enough water.   Not eating enough fiber-rich foods.   Stress or travel.  Lack of physical activity or exercise.  Not going to the restroom when there is the urge to have a bowel movement.  Ignoring the urge to have a bowel movement.  Using laxatives too much. SYMPTOMS   Having fewer than 3 bowel movements a week.   Straining to have a bowel movement.   Having hard, dry, or larger than normal stools.   Feeling full or bloated.   Pain in the lower abdomen.  Not feeling relief after having a bowel movement. DIAGNOSIS  Your caregiver will take a medical history and perform a physical exam. Further testing may be done for severe constipation. Some tests may include:   A barium enema X-ray to examine your rectum, colon, and sometimes, your small intestine.  A sigmoidoscopy to examine your lower colon.  A colonoscopy to examine your entire colon. TREATMENT  Treatment will depend on the severity of your constipation and what is causing it. Some dietary treatments include drinking  more fluids and eating more fiber-rich foods. Lifestyle treatments may include regular exercise. If these diet and lifestyle recommendations do not help, your caregiver may recommend taking over-the-counter laxative medicines to help you have bowel movements. Prescription medicines may be prescribed if over-the-counter medicines do not work.  HOME CARE INSTRUCTIONS   Increase dietary fiber in your diet, such as fruits, vegetables, whole grains, and beans. Limit high-fat and processed sugars in your diet, such as [place] fries, hamburgers, cookies, candies, and soda.   A fiber supplement may be added to your diet if you cannot get enough fiber from foods.   Drink enough fluids to keep your urine clear or pale yellow.   Exercise regularly or as directed by your caregiver.   Go to the restroom when you have the urge to go. Do not hold it.  Only take medicines as directed by your caregiver. Do not take other medicines for constipation without talking to your caregiver first. SEEK IMMEDIATE MEDICAL CARE IF:   You have bright red blood in your stool.   Your constipation lasts for more than 4 days or gets worse.   You have abdominal or rectal pain.   You have thin, pencil-like stools.  You have unexplained weight loss. MAKE SURE YOU:   Understand these instructions.  Will watch your condition.  Will get help right away if you are not doing well or get worse. Document Released: 11/17/2003 Document Revised: 05/13/2011 Document Reviewed: 01/22/2011 [facility] Patient Information 2014 [place], [place].

## 2013-02-23 ENCOUNTER — Other Ambulatory Visit: Payer: Self-pay | Admitting: Nurse Practitioner

## 2013-02-23 DIAGNOSIS — R609 Edema, unspecified: Secondary | ICD-10-CM

## 2013-02-23 NOTE — Addendum Note (Signed)
Addended by: Maurice Small on: 02/23/2013 01:29 PM   Modules accepted: Orders

## 2013-03-01 ENCOUNTER — Other Ambulatory Visit: Payer: Medicare Other

## 2013-03-01 DIAGNOSIS — E1159 Type 2 diabetes mellitus with other circulatory complications: Secondary | ICD-10-CM

## 2013-03-01 DIAGNOSIS — E785 Hyperlipidemia, unspecified: Secondary | ICD-10-CM

## 2013-03-02 LAB — COMPREHENSIVE METABOLIC PANEL
ALT: 11 IU/L (ref 0–32)
AST: 20 IU/L (ref 0–40)
Alkaline Phosphatase: 89 IU/L (ref 39–117)
BUN/Creatinine Ratio: 42 — ABNORMAL HIGH (ref 11–26)
Chloride: 101 mmol/L (ref 97–108)
GFR calc Af Amer: 86 mL/min/{1.73_m2} (ref >59)
GFR calc non Af Amer: 75 mL/min/{1.73_m2} (ref >59)
Glucose: 100 mg/dL — ABNORMAL HIGH (ref 65–99)
Potassium: 4.5 mmol/L (ref 3.5–5.2)
Sodium: 141 mmol/L (ref 134–144)
Total Bilirubin: 0.4 mg/dL (ref 0.0–1.2)

## 2013-03-02 LAB — LIPID PANEL: Cholesterol, Total: 155 mg/dL (ref 100–199)

## 2013-03-03 ENCOUNTER — Encounter: Payer: Self-pay | Admitting: *Deleted

## 2013-04-08 ENCOUNTER — Other Ambulatory Visit: Payer: Self-pay | Admitting: *Deleted

## 2013-04-08 MED ORDER — GLUCOSE BLOOD VI STRP: ORAL_STRIP | Status: DC

## 2013-04-08 MED ORDER — BAYER MICROLET LANCETS MISC: Status: DC

## 2013-04-08 NOTE — Telephone Encounter (Signed)
rx sent to pharmacy per pt request

## 2013-04-22 ENCOUNTER — Other Ambulatory Visit: Payer: Medicare Other

## 2013-04-22 DIAGNOSIS — E1059 Type 1 diabetes mellitus with other circulatory complications: Secondary | ICD-10-CM | POA: Diagnosis not present

## 2013-04-23 LAB — BASIC METABOLIC PANEL
BUN / CREAT RATIO: 32 — AB (ref 11–26)
BUN: 25 mg/dL (ref 8–27)
CHLORIDE: 98 mmol/L (ref 97–108)
CO2: 28 mmol/L (ref 18–29)
Calcium: 9.3 mg/dL (ref 8.7–10.3)
Creatinine, Ser: 0.77 mg/dL (ref 0.57–1.00)
GFR calc Af Amer: 80 mL/min/{1.73_m2} (ref >59)
GFR calc non Af Amer: 69 mL/min/{1.73_m2} (ref >59)
Glucose: 99 mg/dL (ref 65–99)
Potassium: 4.1 mmol/L (ref 3.5–5.2)
SODIUM: 141 mmol/L (ref 134–144)

## 2013-04-23 LAB — HEMOGLOBIN A1C
Est. average glucose Bld gHb Est-mCnc: 137 mg/dL
HEMOGLOBIN A1C: 6.4 % — AB (ref 4.8–5.6)

## 2013-04-27 ENCOUNTER — Ambulatory Visit: Payer: Medicare Other | Admitting: Internal Medicine

## 2013-04-28 ENCOUNTER — Ambulatory Visit (INDEPENDENT_AMBULATORY_CARE_PROVIDER_SITE_OTHER): Payer: Medicare Other | Admitting: Internal Medicine

## 2013-04-28 ENCOUNTER — Encounter: Payer: Self-pay | Admitting: Internal Medicine

## 2013-04-28 VITALS — BP 140/72 | HR 66 | Temp 97.4°F | Resp 20 | Ht 63.0 in | Wt 152.6 lb

## 2013-04-28 DIAGNOSIS — M129 Arthropathy, unspecified: Secondary | ICD-10-CM

## 2013-04-28 DIAGNOSIS — E039 Hypothyroidism, unspecified: Secondary | ICD-10-CM

## 2013-04-28 DIAGNOSIS — E785 Hyperlipidemia, unspecified: Secondary | ICD-10-CM

## 2013-04-28 DIAGNOSIS — I10 Essential (primary) hypertension: Secondary | ICD-10-CM

## 2013-04-28 DIAGNOSIS — R32 Unspecified urinary incontinence: Secondary | ICD-10-CM

## 2013-04-28 DIAGNOSIS — R609 Edema, unspecified: Secondary | ICD-10-CM | POA: Diagnosis not present

## 2013-04-28 DIAGNOSIS — R269 Unspecified abnormalities of gait and mobility: Secondary | ICD-10-CM

## 2013-04-28 DIAGNOSIS — E1159 Type 2 diabetes mellitus with other circulatory complications: Secondary | ICD-10-CM

## 2013-04-28 DIAGNOSIS — M1990 Unspecified osteoarthritis, unspecified site: Secondary | ICD-10-CM

## 2013-04-28 DIAGNOSIS — I872 Venous insufficiency (chronic) (peripheral): Secondary | ICD-10-CM

## 2013-04-28 MED ORDER — TOLTERODINE TARTRATE ER 4 MG PO CP24: ORAL_CAPSULE | ORAL | Status: DC

## 2013-04-28 MED ORDER — FUROSEMIDE 40 MG PO TABS: ORAL_TABLET | ORAL | Status: DC

## 2013-04-28 MED ORDER — LOSARTAN POTASSIUM 50 MG PO TABS: ORAL_TABLET | ORAL | Status: DC

## 2013-04-28 MED ORDER — LEVOTHYROXINE SODIUM 25 MCG PO TABS: ORAL_TABLET | ORAL | Status: DC

## 2013-04-28 NOTE — Progress Notes (Signed)
Patient ID: Jessica Hawkins, female   DOB: 1922-12-02, 80 y.o.   MRN: [id number]    Location:    [facility]  Place of Service:  OFFICE   Allergies  Allergen Reactions  . Cabbage     unknown    Chief Complaint  Patient presents with  . Follow-up    HPI:  HTN (hypertension) - Benefits from losartan (COZAAR) 50 MG tablet  Hypothyroid - compensated on levothyroxine (SYNTHROID) 25 MCG tablet, TSH  Edema - benefits from furosemide (LASIX) 40 MG tablet  Arthritis: generalized. Worse in knees and hips.  Abnormality of gait: using cane and walker  Type II or unspecified type diabetes mellitus with peripheral circulatory disorders, not stated as uncontrolled(250.70) - controlled  Venous insufficiency (chronic) (peripheral): contributes to edema  Hyperlipidemia: controlled  Unspecified urinary incontinence - would like to try a medication    Medications: Patient's Medications  New Prescriptions   No medications on file  Previous Medications   ASPIRIN 81 MG TABLET    Take 81 mg by mouth daily.   BAYER MICROLET LANCETS LANCETS    Use as instructed to check blood sugar once daily.250.00   CALCIUM CARBONATE PO    Take 1 tablet by mouth daily.   FUROSEMIDE (LASIX) 40 MG TABLET    Take 1 tablet (40 mg total) by mouth daily. Take one tablet by mouth once daily for edema   GLUCOSE BLOOD (BAYER CONTOUR TEST) TEST STRIP    Use as instructed to check blood sugar once daily. 250.00   LEVOTHYROXINE (SYNTHROID) 25 MCG TABLET    Take one tablet by mouth daily for thyroid supplement.   LOSARTAN (COZAAR) 50 MG TABLET    TAKE 1 TABLET DAILY   MULTIPLE VITAMINS-MINERALS (CENTRUM PO)    Take 1 tablet by mouth daily.   PANTOPRAZOLE (PROTONIX) 40 MG TABLET    Take one tablet once daily for stomach   TOLTERODINE (DETROL LA) 4 MG 24 HR CAPSULE    Take one tablet once daily for bladder  Modified Medications   No medications on file  Discontinued Medications   No medications on file     Review of  Systems  Constitutional: Negative for fever, chills, activity change, appetite change, fatigue and unexpected weight change.  HENT: Negative.   Eyes: Negative.   Respiratory: Negative.  Negative for cough and shortness of breath.   Cardiovascular: Positive for leg swelling. Negative for chest pain and palpitations.  Gastrointestinal: Negative for vomiting, diarrhea and abdominal distention.  Endocrine: Negative for cold intolerance, heat intolerance, polydipsia, polyphagia and polyuria.  Genitourinary:       Incontinent  Musculoskeletal: Positive for arthralgias, back pain, gait problem, joint swelling and myalgias.  Skin: Negative.   Neurological: Negative.   Hematological: Negative.   Psychiatric/Behavioral: Negative.     Filed Vitals:   04/28/13 1600 04/28/13 1609  BP: 162/78 140/72  Pulse: 66   Temp: 97.4 F (36.3 C)   TempSrc: Oral   Resp: 20   Height: 5\' 3"  (1.6 m)   Weight: 152 lb 9.6 oz (69.219 kg)   SpO2: 98%    Physical Exam  Constitutional: She is oriented to person, place, and time. She appears well-developed. No distress.  frail  HENT:  Head: Normocephalic and atraumatic.  Left Ear: External ear normal.  Nose: Nose normal.  Eyes: Conjunctivae and EOM are normal. Pupils are equal, round, and reactive to light.  Neck: Normal range of motion. Neck supple. No JVD present. No tracheal  deviation present. No thyromegaly present.  Cardiovascular: Normal rate, regular rhythm, normal heart sounds and intact distal pulses.  Exam reveals no gallop and no friction rub.   No murmur heard. Varicose veins.  Pulmonary/Chest: No respiratory distress. She has no wheezes. She has no rales.  Abdominal: She exhibits no distension and no mass. There is no tenderness.  Musculoskeletal: She exhibits edema and tenderness.  Poor balance. Using walker.  Lymphadenopathy:    She has no cervical adenopathy.  Neurological: She is alert and oriented to person, place, and time. No cranial  nerve deficit. Coordination normal.  Skin: No rash noted. No erythema. No pallor.  Seborrheic keratoses  Psychiatric: She has a normal mood and affect. Her behavior is normal. Thought content normal.     Labs reviewed: Appointment on 04/22/2013  Component Date Value Ref Range Status  . Hemoglobin A1C 04/22/2013 6.4* 4.8 - 5.6 % Final   Comment:          Increased risk for diabetes: 5.7 - 6.4                                   Diabetes: >6.4                                   Glycemic control for adults with diabetes: <7.0  . Estimated average glucose 04/22/2013 137   Final  . Glucose 04/22/2013 99  65 - 99 mg/dL Final  . BUN 16/12/9602 25  8 - 27 mg/dL Final  . Creatinine, Ser 04/22/2013 0.77  0.57 - 1.00 mg/dL Final  . GFR calc non Af Amer 04/22/2013 69  >59 mL/min/1.73 Final  . GFR calc Af Amer 04/22/2013 80  >59 mL/min/1.73 Final  . BUN/Creatinine Ratio 04/22/2013 32* 11 - 26 Final  . Sodium 04/22/2013 141  134 - 144 mmol/L Final  . Potassium 04/22/2013 4.1  3.5 - 5.2 mmol/L Final  . Chloride 04/22/2013 98  97 - 108 mmol/L Final  . CO2 04/22/2013 28  18 - 29 mmol/L Final  . Calcium 04/22/2013 9.3  8.7 - 10.3 mg/dL Final  Appointment on 54/11/8117  Component Date Value Ref Range Status  . Hemoglobin A1C 03/01/2013 6.5* 4.8 - 5.6 % Final   Comment:          Increased risk for diabetes: 5.7 - 6.4                                   Diabetes: >6.4                                   Glycemic control for adults with diabetes: <7.0  . Estimated average glucose 03/01/2013 140   Final  . Glucose 03/01/2013 100* 65 - 99 mg/dL Final  . BUN 14/78/2956 30* 8 - 27 mg/dL Final  . Creatinine, Ser 03/01/2013 0.72  0.57 - 1.00 mg/dL Final  . GFR calc non Af Amer 03/01/2013 75  >59 mL/min/1.73 Final  . GFR calc Af Amer 03/01/2013 86  >59 mL/min/1.73 Final  . BUN/Creatinine Ratio 03/01/2013 42* 11 - 26 Final  . Sodium 03/01/2013 141  134 - 144 mmol/L Final  . Potassium 03/01/2013 4.5  3.5 -  5.2  mmol/L Final  . Chloride 03/01/2013 101  97 - 108 mmol/L Final  . CO2 03/01/2013 27  18 - 29 mmol/L Final  . Calcium 03/01/2013 9.1  8.6 - 10.2 mg/dL Final  . Total Protein 03/01/2013 6.5  6.0 - 8.5 g/dL Final  . Albumin 21/30/865712/29/2014 3.7  3.5 - 4.7 g/dL Final  . Globulin, Total 03/01/2013 2.8  1.5 - 4.5 g/dL Final  . Albumin/Globulin Ratio 03/01/2013 1.3  1.1 - 2.5 Final  . Total Bilirubin 03/01/2013 0.4  0.0 - 1.2 mg/dL Final  . Alkaline Phosphatase 03/01/2013 89  39 - 117 IU/L Final  . AST 03/01/2013 20  0 - 40 IU/L Final  . ALT 03/01/2013 11  0 - 32 IU/L Final  . Cholesterol, Total 03/01/2013 155  100 - 199 mg/dL Final  . Triglycerides 03/01/2013 48  0 - 149 mg/dL Final  . HDL 84/69/629512/29/2014 67  >39 mg/dL Final   Comment: According to ATP-III Guidelines, HDL-C >59 mg/dL is considered a                          negative risk factor for CHD.  Marland Kitchen. VLDL Cholesterol Cal 03/01/2013 10  5 - 40 mg/dL Final  . LDL Calculated 03/01/2013 78  0 - 99 mg/dL Final  . Chol/HDL Ratio 03/01/2013 2.3  0.0 - 4.4 ratio units Final   Comment:                                   T. Chol/HDL Ratio                                                                      Men  Women                                                        1/2 Avg.Risk  3.4    3.3                                                            Avg.Risk  5.0    4.4                                                         2X Avg.Risk  9.6    7.1  3X Avg.Risk 23.4   11.0  Office Visit on 02/18/2013  Component Date Value Ref Range Status  . HM Mammogram 12/16/2012 normal   Final  Office Visit on 02/01/2013  Component Date Value Ref Range Status  . Glucose 02/01/2013 167* 65 - 99 mg/dL Final  . BUN 82/95/6213 34* 8 - 27 mg/dL Final  . Creatinine, Ser 02/01/2013 0.85  0.57 - 1.00 mg/dL Final  . GFR calc non Af Amer 02/01/2013 61  >59 mL/min/1.73 Final  . GFR calc Af Amer 02/01/2013 71  >59  mL/min/1.73 Final  . BUN/Creatinine Ratio 02/01/2013 40* 11 - 26 Final  . Sodium 02/01/2013 141  134 - 144 mmol/L Final  . Potassium 02/01/2013 4.3  3.5 - 5.2 mmol/L Final  . Chloride 02/01/2013 99  97 - 108 mmol/L Final  . CO2 02/01/2013 27  18 - 29 mmol/L Final  . Calcium 02/01/2013 9.5  8.6 - 10.2 mg/dL Final      Assessment/Plan  1. HTN (hypertension) - losartan (COZAAR) 50 MG tablet; One daily to control BP  Dispense: 30 tablet; Refill: 3  2. Hypothyroid - levothyroxine (SYNTHROID) 25 MCG tablet; Take one tablet by mouth daily for thyroid supplement.  Dispense: 30 tablet; Refill: 3 - TSH; Future  3. Edema - furosemide (LASIX) 40 MG tablet; Take one tablet by daily for edema  Dispense: 30 tablet; Refill: 3  4. Arthritis Continue current med  5. Abnormality of gait Continue walker  6. Type II or unspecified type diabetes mellitus with peripheral circulatory disorders, not stated as uncontrolled(250.70) - Hemoglobin A1c; Future - Basic metabolic panel; Future - Microalbumin / creatinine urine ratio; Future  7. Venous insufficiency (chronic) (peripheral) Compression hose  8. Other and unspecified hyperlipidemia No change in med  9. Unspecified urinary incontinence - tolterodine (DETROL LA) 4 MG 24 hr capsule; Take one tablet once daily for bladder control  Dispense: 30 capsule; Refill: 3

## 2013-04-28 NOTE — Patient Instructions (Signed)
Continue medications as listed 

## 2013-05-10 ENCOUNTER — Ambulatory Visit (INDEPENDENT_AMBULATORY_CARE_PROVIDER_SITE_OTHER): Payer: Medicare Other | Admitting: Podiatry

## 2013-05-10 ENCOUNTER — Encounter: Payer: Self-pay | Admitting: Podiatry

## 2013-05-10 VITALS — BP 113/62 | HR 75 | Resp 16

## 2013-05-10 DIAGNOSIS — M79609 Pain in unspecified limb: Secondary | ICD-10-CM

## 2013-05-10 DIAGNOSIS — Q828 Other specified congenital malformations of skin: Secondary | ICD-10-CM

## 2013-05-10 DIAGNOSIS — B351 Tinea unguium: Secondary | ICD-10-CM | POA: Diagnosis not present

## 2013-05-10 NOTE — Patient Instructions (Addendum)
Diabetes and Foot Care  Apply skin lotion to your feet daily   Diabetes may cause you to have problems because of poor blood supply (circulation) to your feet and legs. This may cause the skin on your feet to become thinner, break easier, and heal more slowly. Your skin may become dry, and the skin may peel and crack. You may also have nerve damage in your legs and feet causing decreased feeling in them. You may not notice minor injuries to your feet that could lead to infections or more serious problems. Taking care of your feet is one of the most important things you can do for yourself.  HOME CARE INSTRUCTIONS  Wear shoes at all times, even in the house. Do not go barefoot. Bare feet are easily injured.  Check your feet daily for blisters, cuts, and redness. If you cannot see the bottom of your feet, use a mirror or ask someone for help.  Wash your feet with warm water (do not use hot water) and mild soap. Then pat your feet and the areas between your toes until they are completely dry. Do not soak your feet as this can dry your skin.  Apply a moisturizing lotion or petroleum jelly (that does not contain alcohol and is unscented) to the skin on your feet and to dry, brittle toenails. Do not apply lotion between your toes.  Trim your toenails straight across. Do not dig under them or around the cuticle. File the edges of your nails with an emery board or nail file.  Do not cut corns or calluses or try to remove them with medicine.  Wear clean socks or stockings every day. Make sure they are not too tight. Do not wear knee-high stockings since they may decrease blood flow to your legs.  Wear shoes that fit properly and have enough cushioning. To break in new shoes, wear them for just a few hours a day. This prevents you from injuring your feet. Always look in your shoes before you put them on to be sure there are no objects inside.  Do not cross your legs. This may decrease the blood flow to  your feet.  If you find a minor scrape, cut, or break in the skin on your feet, keep it and the skin around it clean and dry. These areas may be cleansed with mild soap and water. Do not cleanse the area with peroxide, alcohol, or iodine.  When you remove an adhesive bandage, be sure not to damage the skin around it.  If you have a wound, look at it several times a day to make sure it is healing.  Do not use heating pads or hot water bottles. They may burn your skin. If you have lost feeling in your feet or legs, you may not know it is happening until it is too late.  Make sure your health care provider performs a complete foot exam at least annually or more often if you have foot problems. Report any cuts, sores, or bruises to your health care provider immediately. SEEK MEDICAL CARE IF:   You have an injury that is not healing.  You have cuts or breaks in the skin.  You have an ingrown nail.  You notice redness on your legs or feet.  You feel burning or tingling in your legs or feet.  You have pain or cramps in your legs and feet.  Your legs or feet are numb.  Your feet always feel cold. SEEK  IMMEDIATE MEDICAL CARE IF:   There is increasing redness, swelling, or pain in or around a wound.  There is a red line that goes up your leg.  Pus is coming from a wound.  You develop a fever or as directed by your health care provider.  You notice a bad smell coming from an ulcer or wound. Document Released: 02/16/2000 Document Revised: 10/21/2012 Document Reviewed: 07/28/2012 [facility] Patient Information 2014 [place].

## 2013-05-11 NOTE — Progress Notes (Signed)
Patient ID: Jessica MunchPauline G Hawkins, female   DOB: 15-Sep-1922, 80 y.o.   MRN: [id number]  Subjective: This 80 year old black female known type II diabetic presents for ongoing debridement of painful mycotic toenails and keratoses. She has been a patient in this practice since 2007.   Objective: Brittle, hypertrophic, discolored, toenails x10 with palpable tenderness in all 10 nail plates. Hemorrhagic keratoses distal third right toe. Nucleated plantar keratoses noted  Assessment: Symptomatic onychomycoses x10  Painful hyperkeratotic lesion distal third right  Porokeratoses bilaterally  Plan: Nails x10 and keratoses  debrided back without any bleeding.  Reappoint at three-month intervals.

## 2013-05-12 ENCOUNTER — Ambulatory Visit
Admission: RE | Admit: 2013-05-12 | Discharge: 2013-05-12 | Disposition: A | Discharge status: Home / Self Care | Payer: Medicare Other | Source: Ambulatory Visit | Attending: Nurse Practitioner | Admitting: Nurse Practitioner

## 2013-05-12 ENCOUNTER — Encounter: Payer: Self-pay | Admitting: Nurse Practitioner

## 2013-05-12 ENCOUNTER — Ambulatory Visit (INDEPENDENT_AMBULATORY_CARE_PROVIDER_SITE_OTHER): Payer: Medicare Other | Admitting: Nurse Practitioner

## 2013-05-12 VITALS — BP 122/70 | HR 100 | Temp 97.0°F | Resp 10

## 2013-05-12 DIAGNOSIS — K219 Gastro-esophageal reflux disease without esophagitis: Secondary | ICD-10-CM

## 2013-05-12 DIAGNOSIS — R059 Cough, unspecified: Secondary | ICD-10-CM | POA: Diagnosis not present

## 2013-05-12 DIAGNOSIS — R05 Cough: Secondary | ICD-10-CM | POA: Diagnosis not present

## 2013-05-12 DIAGNOSIS — J209 Acute bronchitis, unspecified: Secondary | ICD-10-CM | POA: Diagnosis not present

## 2013-05-12 MED ORDER — PANTOPRAZOLE SODIUM 40 MG PO TBEC: DELAYED_RELEASE_TABLET | ORAL | Status: DC

## 2013-05-12 NOTE — Progress Notes (Signed)
Patient ID: Jessica Hawkins, female   DOB: 30-Jun-1922, 80 y.o.   MRN: [id number]    Allergies  Allergen Reactions  . Cabbage     unknown    Chief Complaint  Patient presents with  . URI    Cough and congestion x 1 week or more   . Medication Management    Discuss Protonix     HPI: Patient is a 80 y.o. female seen in the office today for follow up cough and congestion; started on mucinex taking 2 tablets every 12 hours which has helped; she has been taking for 4 days and she is feeling much better but thought she should get checked over. Still coughing but better, productive; no shortness of breath no fever. No chest pain.  Chest congestion. Sleeping at night;  Reports she is drinking a lot of water.  Review of Systems:  Review of Systems  Constitutional: Negative for fever, chills and malaise/fatigue.  HENT: Negative for congestion and sore throat.   Respiratory: Positive for cough and sputum production. Negative for shortness of breath and wheezing.   Cardiovascular: Negative for chest pain.  Gastrointestinal: Positive for heartburn. Negative for abdominal pain, diarrhea and constipation.  Genitourinary: Negative for dysuria, urgency and frequency.  Neurological: Negative for weakness and headaches.     Past Medical History  Diagnosis Date  . Hypertension   . Osteoporosis   . GERD (gastroesophageal reflux disease) 06/29/2011  . Hypothyroid 06/29/2011  . HTN (hypertension) 06/29/2011  . Platelet disorder     ? Von Willebrand  . Hyperlipidemia   . Diabetes mellitus without complication   . Edema   . BBB (bundle branch block)     RT  . Hiatal hernia   . Fibrocystic breast   . Arthritis     Osteoarthritis  . Vertigo   . Insomnia   . Gait disorder   . Unspecified constipation   . Disturbance of skin sensation   . Unspecified vitamin D deficiency   . Unspecified urinary incontinence   . Unspecified sleep apnea   . Coronary atherosclerosis of unspecified type of vessel,  native or graft   . Diaphragmatic hernia without mention of obstruction or gangrene   . Debility, unspecified    Past Surgical History  Procedure Laterality Date  . Hip surgery Right 2002    ORIF  . Thyroidectomy  1952  . Tonsillectomy    . Femur im nail  06/29/2011    Procedure: INTRAMEDULLARY (IM) NAIL FEMORAL;  Surgeon: Verlee RossettiSteven R Norris, MD;  [facility];  Service: Orthopedics;  Laterality: Left;  . Rotator cuff repair Left 1999    Tear  . Abdominal hysterectomy    . Eye surgery Right 2005    Cataract surgery  . Eye surgery Left 2006    Cataract surgery   Social History:   reports that she has never smoked. She has never used smokeless tobacco. She reports that she does not drink alcohol or use illicit drugs.  Family History  Problem Relation Age of Onset  . Cancer Mother     Stomach  . Cancer Father     Prostate  . Emphysema Father   . Alcohol abuse Sister   . Liver disease Sister   . Kidney disease Brother   . Hypertension Daughter   . Cancer Brother     Prostate, Bladder  . Emphysema Brother   . Cancer Brother     Prostate  . Arthritis Daughter  Knees  . Heart murmur Son   . Mental illness Son     Autism    Medications: Patient's Medications  New Prescriptions   No medications on file  Previous Medications   ASPIRIN 81 MG TABLET    Take 81 mg by mouth daily.   BAYER MICROLET LANCETS LANCETS    Use as instructed to check blood sugar once daily.250.00   CALCIUM CARBONATE PO    Take 1 tablet by mouth daily.   FUROSEMIDE (LASIX) 40 MG TABLET    Take one tablet by daily for edema   GLUCOSE BLOOD (BAYER CONTOUR TEST) TEST STRIP    Use as instructed to check blood sugar once daily. 250.00   GUAIFENESIN (MUCINEX) 600 MG 12 HR TABLET    Take 600 mg by mouth 2 (two) times daily.   LEVOTHYROXINE (SYNTHROID) 25 MCG TABLET    Take one tablet by mouth daily for thyroid supplement.   LOSARTAN (COZAAR) 50 MG TABLET    One daily to control BP   PANTOPRAZOLE  (PROTONIX) 40 MG TABLET    Take one tablet once daily for stomach   TOLTERODINE (DETROL LA) 4 MG 24 HR CAPSULE    Take one tablet once daily for bladder control  Modified Medications   No medications on file  Discontinued Medications   MULTIPLE VITAMINS-MINERALS (CENTRUM PO)    Take 1 tablet by mouth daily.     Physical Exam:  Filed Vitals:   05/12/13 1129  BP: 122/70  Pulse: 100  Temp: 97 F (36.1 C)  TempSrc: Oral  Resp: 10  SpO2: 95%    Physical Exam  Constitutional: She is oriented to person, place, and time and well-developed, well-nourished, and in no distress.  HENT:  Head: Normocephalic and atraumatic.  Right Ear: External ear normal.  Left Ear: External ear normal.  Nose: Nose normal.  Mouth/Throat: Oropharynx is clear and moist.  Neck: Normal range of motion. Neck supple. No thyromegaly present.  Cardiovascular: Normal rate, regular rhythm and normal heart sounds.   Pulmonary/Chest: Effort normal. No respiratory distress. She has no wheezes.  Rhonchi throughout right   Abdominal: Soft. Bowel sounds are normal. She exhibits no distension.  Musculoskeletal: Normal range of motion. She exhibits edema.  Neurological: She is alert and oriented to person, place, and time.  Skin: Skin is warm and dry. No erythema.  Psychiatric: Affect normal.    Labs reviewed: Basic Metabolic Panel:  Recent Labs  53/66/44 1057 02/01/13 1537 03/01/13 1211 04/22/13 1155  NA 143 141 141 141  K 4.5 4.3 4.5 4.1  CL 102 99 101 98  CO2 27 27 27 28   GLUCOSE 95 167* 100* 99  BUN 18 34* 30* 25  CREATININE 0.71 0.85 0.72 0.77  CALCIUM 9.3 9.5 9.1 9.3  TSH 0.637  --   --   --    Liver Function Tests:  Recent Labs  08/25/12 1619 12/11/12 1057 03/01/13 1211  AST 18 18 20   ALT 9 8 11   ALKPHOS 80 75 89  BILITOT 0.7 0.6 0.4  PROT 7.7 6.6 6.5  ALBUMIN 3.7  --   --    No results found for this basename: LIPASE, AMYLASE,  in the last 8760 hours No results found for this  basename: AMMONIA,  in the last 8760 hours CBC:  Recent Labs  08/24/12 1648 08/25/12 1619  WBC 5.7 7.0  NEUTROABS 3.0 5.8  HGB 11.9 12.1  HCT 35.5 37.1  MCV 81 85.1  PLT  --  176   Lipid Panel:  Recent Labs  09/03/12 1155 12/11/12 1057 03/01/13 1211  HDL 66 67 67  LDLCALC 84 66 78  TRIG 54 46 48  CHOLHDL 2.4 2.1 2.3   TSH:  Recent Labs  12/11/12 1057  TSH 0.637   A1C: No components found with this basename: A1C,    Assessment/Plan  1. Acute bronchitis -improving however due to rhonchi will get DG Chest 2 View to rule out pneumonia -cont mucinex BID for 1 week  -may use dextromethorphan for cough if needed  -deep breathing encouraged - to follow up if symptoms worsen or fevers occur  2. GERD (gastroesophageal reflux disease) -has increase in symptoms off medications  - pantoprazole (PROTONIX) 40 MG tablet; Take one tablet once daily for stomach  Dispense: 90 tablet; Refill: 3

## 2013-05-12 NOTE — Patient Instructions (Signed)
To get chest xray to rule out pneumonia   May take DEXTROMETHORPHAN IF NEEDED with mucinex to help with cough -- ask pharmacist to help if you can not find this at the pharmacy   Cont mucinex twice daily for 4 more days   Bronchitis Bronchitis is inflammation of the airways that extend from the windpipe into the lungs (bronchi). The inflammation often causes mucus to develop, which leads to a cough. If the inflammation becomes severe, it may cause shortness of breath. CAUSES  Bronchitis may be caused by:   Viral infections.   Bacteria.   Cigarette smoke.   Allergens, pollutants, and other irritants.  SIGNS AND SYMPTOMS  The most common symptom of bronchitis is a frequent cough that produces mucus. Other symptoms include:  Fever.   Body aches.   Chest congestion.   Chills.   Shortness of breath.   Sore throat.  DIAGNOSIS  Bronchitis is usually diagnosed through a medical history and physical exam. Tests, such as chest X-rays, are sometimes done to rule out other conditions.  TREATMENT  You may need to avoid contact with whatever caused the problem (smoking, for example). Medicines are sometimes needed. These may include:  Antibiotics. These may be prescribed if the condition is caused by bacteria.  Cough suppressants. These may be prescribed for relief of cough symptoms.   Inhaled medicines. These may be prescribed to help open your airways and make it easier for you to breathe.   Steroid medicines. These may be prescribed for those with recurrent (chronic) bronchitis. HOME CARE INSTRUCTIONS  Get plenty of rest.   Drink enough fluids to keep your urine clear or pale yellow (unless you have a medical condition that requires fluid restriction). Increasing fluids may help thin your secretions and will prevent dehydration.   Only take over-the-counter or prescription medicines as directed by your health care provider.  Only take antibiotics as directed.  Make sure you finish them even if you start to feel better.  Avoid secondhand smoke, irritating chemicals, and strong fumes. These will make bronchitis worse. If you are a smoker, quit smoking. Consider using nicotine gum or skin patches to help control withdrawal symptoms. Quitting smoking will help your lungs heal faster.   Put a cool-mist humidifier in your bedroom at night to moisten the air. This may help loosen mucus. Change the water in the humidifier daily. You can also run the hot water in your shower and sit in the bathroom with the door closed for 5 10 minutes.   Follow up with your health care provider as directed.   Wash your hands frequently to avoid catching bronchitis again or spreading an infection to others.  SEEK MEDICAL CARE IF: Your symptoms do not improve after 1 week of treatment.  SEEK IMMEDIATE MEDICAL CARE IF:  Your fever increases.  You have chills.   You have chest pain.   You have worsening shortness of breath.   You have bloody sputum.  You faint.  You have lightheadedness.  You have a severe headache.   You vomit repeatedly. MAKE SURE YOU:   Understand these instructions.  Will watch your condition.  Will get help right away if you are not doing well or get worse. Document Released: 02/18/2005 Document Revised: 12/09/2012 Document Reviewed: 10/13/2012 [facility] Patient Information 2014 [employer] [place].

## 2013-05-21 ENCOUNTER — Other Ambulatory Visit: Payer: Self-pay | Admitting: *Deleted

## 2013-05-21 DIAGNOSIS — I10 Essential (primary) hypertension: Secondary | ICD-10-CM

## 2013-05-21 DIAGNOSIS — R32 Unspecified urinary incontinence: Secondary | ICD-10-CM

## 2013-05-21 DIAGNOSIS — E039 Hypothyroidism, unspecified: Secondary | ICD-10-CM

## 2013-05-21 DIAGNOSIS — R609 Edema, unspecified: Secondary | ICD-10-CM

## 2013-05-21 MED ORDER — LEVOTHYROXINE SODIUM 25 MCG PO TABS: ORAL_TABLET | ORAL | Status: DC

## 2013-05-21 MED ORDER — BAYER MICROLET LANCETS MISC: Status: DC

## 2013-05-21 MED ORDER — LOSARTAN POTASSIUM 50 MG PO TABS: ORAL_TABLET | ORAL | Status: DC

## 2013-05-21 MED ORDER — GLUCOSE BLOOD VI STRP: ORAL_STRIP | Status: DC

## 2013-05-21 MED ORDER — TOLTERODINE TARTRATE ER 4 MG PO CP24
ORAL_CAPSULE | ORAL | Status: DC
Start: 2013-05-21 — End: 2013-05-27

## 2013-05-21 MED ORDER — FUROSEMIDE 40 MG PO TABS: ORAL_TABLET | ORAL | Status: DC

## 2013-05-27 ENCOUNTER — Other Ambulatory Visit: Payer: Self-pay

## 2013-05-27 DIAGNOSIS — R609 Edema, unspecified: Secondary | ICD-10-CM

## 2013-05-27 DIAGNOSIS — E039 Hypothyroidism, unspecified: Secondary | ICD-10-CM

## 2013-05-27 DIAGNOSIS — R32 Unspecified urinary incontinence: Secondary | ICD-10-CM

## 2013-05-27 DIAGNOSIS — I10 Essential (primary) hypertension: Secondary | ICD-10-CM

## 2013-05-27 MED ORDER — LOSARTAN POTASSIUM 50 MG PO TABS: ORAL_TABLET | ORAL | Status: DC

## 2013-05-27 MED ORDER — FUROSEMIDE 40 MG PO TABS: ORAL_TABLET | ORAL | Status: DC

## 2013-05-27 MED ORDER — BAYER MICROLET LANCETS MISC: Status: DC

## 2013-05-27 MED ORDER — GLUCOSE BLOOD VI STRP: ORAL_STRIP | Status: DC

## 2013-05-27 MED ORDER — LEVOTHYROXINE SODIUM 25 MCG PO TABS: ORAL_TABLET | ORAL | Status: DC

## 2013-05-27 MED ORDER — TOLTERODINE TARTRATE ER 4 MG PO CP24
ORAL_CAPSULE | ORAL | Status: DC
Start: 2013-05-27 — End: 2013-08-24

## 2013-08-16 ENCOUNTER — Other Ambulatory Visit: Payer: Medicare Other

## 2013-08-16 ENCOUNTER — Ambulatory Visit (INDEPENDENT_AMBULATORY_CARE_PROVIDER_SITE_OTHER): Payer: Medicare Other | Admitting: Podiatry

## 2013-08-16 ENCOUNTER — Encounter: Payer: Self-pay | Admitting: Podiatry

## 2013-08-16 VITALS — BP 134/69 | HR 71 | Resp 17 | Ht 62.0 in | Wt 170.0 lb

## 2013-08-16 DIAGNOSIS — M79609 Pain in unspecified limb: Secondary | ICD-10-CM | POA: Diagnosis not present

## 2013-08-16 DIAGNOSIS — E1159 Type 2 diabetes mellitus with other circulatory complications: Secondary | ICD-10-CM | POA: Diagnosis not present

## 2013-08-16 DIAGNOSIS — B351 Tinea unguium: Secondary | ICD-10-CM

## 2013-08-16 DIAGNOSIS — E039 Hypothyroidism, unspecified: Secondary | ICD-10-CM

## 2013-08-16 DIAGNOSIS — Q828 Other specified congenital malformations of skin: Secondary | ICD-10-CM | POA: Diagnosis not present

## 2013-08-16 NOTE — Progress Notes (Signed)
Patient ID: Jessica MunchPauline G Hawkins, female   DOB: [birth year], 80 y.o.   MRN: [id number]  Subjective: Orientated x3 diabetic black female presents complaining of painful toenails and multiple keratoses  Objective: Hypertrophic, brittle, incurvated, discolored toenails x10 Hemorrhagic keratoses distal third toes bilaterally Plantar callus left first MPJ  Assessment: Symptomatic onychomycoses x10 Porokeratoses and keratoses x3  Plan: Debridement toenails and keratoses without a bleeding  Reappoint at three-month intervals

## 2013-08-17 LAB — BASIC METABOLIC PANEL
BUN/Creatinine Ratio: 47 — ABNORMAL HIGH (ref 11–26)
BUN: 27 mg/dL (ref 8–27)
CALCIUM: 9.4 mg/dL (ref 8.7–10.3)
CO2: 26 mmol/L (ref 18–29)
Chloride: 99 mmol/L (ref 97–108)
Creatinine, Ser: 0.58 mg/dL (ref 0.57–1.00)
GFR calc Af Amer: 95 mL/min/{1.73_m2} (ref >59)
GFR, EST NON AFRICAN AMERICAN: 83 mL/min/{1.73_m2} (ref >59)
Glucose: 98 mg/dL (ref 65–99)
POTASSIUM: 4.6 mmol/L (ref 3.5–5.2)
SODIUM: 140 mmol/L (ref 134–144)

## 2013-08-17 LAB — HEMOGLOBIN A1C
ESTIMATED AVERAGE GLUCOSE: 128 mg/dL
Hgb A1c MFr Bld: 6.1 % — ABNORMAL HIGH (ref 4.8–5.6)

## 2013-08-17 LAB — MICROALBUMIN / CREATININE URINE RATIO
Creatinine, Ur: 68 mg/dL (ref 15.0–278.0)
MICROALB/CREAT RATIO: 86.5 mg/g{creat} — AB (ref 0.0–30.0)
Microalbumin, Urine: 58.8 ug/mL — ABNORMAL HIGH (ref 0.0–17.0)

## 2013-08-17 LAB — TSH: TSH: 0.591 u[IU]/mL (ref 0.450–4.500)

## 2013-08-18 ENCOUNTER — Ambulatory Visit (INDEPENDENT_AMBULATORY_CARE_PROVIDER_SITE_OTHER): Payer: Medicare Other | Admitting: Internal Medicine

## 2013-08-18 ENCOUNTER — Encounter: Payer: Self-pay | Admitting: Internal Medicine

## 2013-08-18 VITALS — BP 124/77 | HR 67 | Temp 97.7°F | Wt 145.0 lb

## 2013-08-18 DIAGNOSIS — E039 Hypothyroidism, unspecified: Secondary | ICD-10-CM

## 2013-08-18 DIAGNOSIS — M170 Bilateral primary osteoarthritis of knee: Secondary | ICD-10-CM

## 2013-08-18 DIAGNOSIS — E1159 Type 2 diabetes mellitus with other circulatory complications: Secondary | ICD-10-CM | POA: Diagnosis not present

## 2013-08-18 DIAGNOSIS — I872 Venous insufficiency (chronic) (peripheral): Secondary | ICD-10-CM

## 2013-08-18 DIAGNOSIS — E785 Hyperlipidemia, unspecified: Secondary | ICD-10-CM

## 2013-08-18 DIAGNOSIS — M1710 Unilateral primary osteoarthritis, unspecified knee: Secondary | ICD-10-CM | POA: Diagnosis not present

## 2013-08-18 DIAGNOSIS — R269 Unspecified abnormalities of gait and mobility: Secondary | ICD-10-CM

## 2013-08-18 DIAGNOSIS — I10 Essential (primary) hypertension: Secondary | ICD-10-CM

## 2013-08-18 DIAGNOSIS — R609 Edema, unspecified: Secondary | ICD-10-CM

## 2013-08-18 NOTE — Patient Instructions (Signed)
Continue current medications. 

## 2013-08-18 NOTE — Progress Notes (Signed)
Patient ID: Jessica MunchPauline G Hinote, female   DOB: [birth year], 80 y.o.   MRN: [id number]    Location:    [facility]  Place of Service:  OFFICE    Allergies  Allergen Reactions  . Cabbage     unknown    Chief Complaint  Patient presents with  . Medical Management of Chronic Issues    4 month follow-up ,discuss labs (copy printed)   . Referral    Eye doctor referral for DM eye exam, patient unable to recall last time she was seen at eye doctor     HPI:   Type II or unspecified type diabetes mellitus with peripheral circulatory disorders, not stated as uncontrolled(250.70): Diabetes is under good control.  Knee pain: Chronic issues with the pain. Has been to see several orthopedists, it is a serious that they have told her that she is too feeble and old to have surgery on her knees.  HTN (hypertension): Controlled  Edema: Stable, bipedal  Venous insufficiency (chronic) (peripheral): Contributes to edema in the legs  Abnormality of gait: Using a walker. Chronic knee pains contribute to her instability.    Medications: Patient's Medications  New Prescriptions   No medications on file  Previous Medications   ASPIRIN 81 MG TABLET    Take 81 mg by mouth daily.   BAYER MICROLET LANCETS LANCETS    Use as instructed to check blood sugar once daily.250.00   CALCIUM CARBONATE PO    Take 1 tablet by mouth daily.   FUROSEMIDE (LASIX) 40 MG TABLET    Take one tablet by daily for edema   GLUCOSE BLOOD (BAYER CONTOUR TEST) TEST STRIP    Use as instructed to check blood sugar once daily. 250.00   GLUCOSE BLOOD TEST STRIP    Test blood sugar once daily   GUAIFENESIN (MUCINEX) 600 MG 12 HR TABLET    Take 600 mg by mouth 2 (two) times daily.   LEVOTHYROXINE (SYNTHROID) 25 MCG TABLET    Take one tablet by mouth daily for thyroid supplement.   LOSARTAN (COZAAR) 50 MG TABLET    One daily to control BP   TOLTERODINE (DETROL LA) 4 MG 24 HR CAPSULE    Take one tablet once daily for bladder control    Modified Medications   No medications on file  Discontinued Medications   PANTOPRAZOLE (PROTONIX) 40 MG TABLET    Take one tablet once daily for stomach     Review of Systems  Constitutional: Negative for fever, chills, activity change, appetite change, fatigue and unexpected weight change.  HENT: Negative.   Eyes: Negative.   Respiratory: Negative.  Negative for cough and shortness of breath.   Cardiovascular: Positive for leg swelling. Negative for chest pain and palpitations.  Gastrointestinal: Negative for vomiting, diarrhea and abdominal distention.  Endocrine: Negative for cold intolerance, heat intolerance, polydipsia, polyphagia and polyuria.  Genitourinary:       Incontinent  Musculoskeletal: Positive for arthralgias, back pain, gait problem, joint swelling and myalgias.  Skin: Negative.   Neurological: Negative.   Hematological: Negative.   Psychiatric/Behavioral: Negative.     Filed Vitals:   08/18/13 1534  BP: 124/77  Pulse: 67  Temp: 97.7 F (36.5 C)  TempSrc: Oral  Weight: 145 lb (65.772 kg)  SpO2: 97%   Body mass index is 26.51 kg/(m^2).  Physical Exam  Constitutional: She is oriented to person, place, and time. She appears well-developed. No distress.  frail  HENT:  Head: Normocephalic and atraumatic.  Left Ear: External ear normal.  Nose: Nose normal.  Eyes: Conjunctivae and EOM are normal. Pupils are equal, round, and reactive to light.  Neck: Normal range of motion. Neck supple. No JVD present. No tracheal deviation present. No thyromegaly present.  Cardiovascular: Normal rate, regular rhythm, normal heart sounds and intact distal pulses.  Exam reveals no gallop and no friction rub.   No murmur heard. Varicose veins.  Pulmonary/Chest: No respiratory distress. She has no wheezes. She has no rales.  Abdominal: She exhibits no distension and no mass. There is no tenderness.  Musculoskeletal: She exhibits edema and tenderness.  Poor balance. Using  walker.  Lymphadenopathy:    She has no cervical adenopathy.  Neurological: She is alert and oriented to person, place, and time. No cranial nerve deficit. Coordination normal.  Skin: No rash noted. No erythema. No pallor.  Seborrheic keratoses  Psychiatric: She has a normal mood and affect. Her behavior is normal. Thought content normal.     Labs reviewed: Appointment on 08/16/2013  Component Date Value Ref Range Status  . Hemoglobin A1C 08/16/2013 6.1* 4.8 - 5.6 % Final   Comment:          Increased risk for diabetes: 5.7 - 6.4                                   Diabetes: >6.4                                   Glycemic control for adults with diabetes: <7.0  . Estimated average glucose 08/16/2013 128   Final  . Glucose 08/16/2013 98  65 - 99 mg/dL Final  . BUN 16/10/960406/15/2015 27  8 - 27 mg/dL Final  . Creatinine, Ser 08/16/2013 0.58  0.57 - 1.00 mg/dL Final  . GFR calc non Af Amer 08/16/2013 83  >59 mL/min/1.73 Final  . GFR calc Af Amer 08/16/2013 95  >59 mL/min/1.73 Final  . BUN/Creatinine Ratio 08/16/2013 47* 11 - 26 Final  . Sodium 08/16/2013 140  134 - 144 mmol/L Final  . Potassium 08/16/2013 4.6  3.5 - 5.2 mmol/L Final  . Chloride 08/16/2013 99  97 - 108 mmol/L Final  . CO2 08/16/2013 26  18 - 29 mmol/L Final  . Calcium 08/16/2013 9.4  8.7 - 10.3 mg/dL Final  . TSH 54/09/811906/15/2015 0.591  0.450 - 4.500 uIU/mL Final  . Creatinine, Ur 08/16/2013 68.0  15.0 - 278.0 mg/dL Final  . Microalbum.,U,Random 08/16/2013 58.8* 0.0 - 17.0 ug/mL Final  . MICROALB/CREAT RATIO 08/16/2013 86.5* 0.0 - 30.0 mg/g creat Final      Assessment/Plan  1. Type II or unspecified type diabetes mellitus with peripheral circulatory disorders, not stated as uncontrolled(250.70) Controlled - Hemoglobin A1c; Future - Comprehensive metabolic panel; Future  2. Knee pain Chronic. Advised patient that she should reconcile herself to continued use of pain medication as needed. She will use a walker as needed for  gait abnormality.  3. HTN (hypertension) Controlled - Comprehensive metabolic panel; Future  4. Edema Unchanged  5. Venous insufficiency (chronic) (peripheral) Unchanged  6. Abnormality of gait Use walker  7. Hypothyroid Compensated - TSH; Future  8. Other and unspecified hyperlipidemia Controlled - Lipid panel; Future

## 2013-08-24 ENCOUNTER — Other Ambulatory Visit: Payer: Self-pay | Admitting: *Deleted

## 2013-08-24 DIAGNOSIS — R609 Edema, unspecified: Secondary | ICD-10-CM

## 2013-08-24 DIAGNOSIS — R32 Unspecified urinary incontinence: Secondary | ICD-10-CM

## 2013-08-24 MED ORDER — FUROSEMIDE 40 MG PO TABS: ORAL_TABLET | ORAL | Status: DC

## 2013-08-24 MED ORDER — LOSARTAN POTASSIUM 50 MG PO TABS: ORAL_TABLET | ORAL | Status: DC

## 2013-08-24 MED ORDER — LEVOTHYROXINE SODIUM 25 MCG PO TABS: ORAL_TABLET | ORAL | Status: DC

## 2013-08-24 MED ORDER — TOLTERODINE TARTRATE ER 4 MG PO CP24: ORAL_CAPSULE | ORAL | Status: DC

## 2013-09-10 ENCOUNTER — Other Ambulatory Visit: Payer: Self-pay

## 2013-09-10 ENCOUNTER — Telehealth: Payer: Self-pay | Admitting: *Deleted

## 2013-09-10 MED ORDER — LOSARTAN POTASSIUM 50 MG PO TABS: ORAL_TABLET | ORAL | Status: DC

## 2013-09-10 MED ORDER — LEVOTHYROXINE SODIUM 25 MCG PO TABS: ORAL_TABLET | ORAL | Status: DC

## 2013-09-10 NOTE — Addendum Note (Signed)
Addended by: Maurice SmallBEATTY, Randel Hargens C on: 09/10/2013 02:25 PM   Modules accepted: Orders

## 2013-09-10 NOTE — Telephone Encounter (Signed)
Patient called stating her mail order supply is delayed and [facility]

## 2013-09-10 NOTE — Telephone Encounter (Signed)
Patient daughter, Jessica Hawkins, called and left message to return her call regarding patient's medications.  Tried calling her back and left message on her voicemail to return my call. Also called patient's number and left message to return call. Patient returned my call and stated that her medication refills were taken care of and thanks for my help. She stated that her daughter took care of speaking with [facility].

## 2013-10-06 ENCOUNTER — Encounter: Payer: Self-pay | Admitting: Internal Medicine

## 2013-10-06 ENCOUNTER — Ambulatory Visit (INDEPENDENT_AMBULATORY_CARE_PROVIDER_SITE_OTHER): Payer: Medicare Other | Admitting: Internal Medicine

## 2013-10-06 VITALS — BP 130/72 | HR 79 | Temp 97.7°F | Ht 62.0 in | Wt 140.0 lb

## 2013-10-06 DIAGNOSIS — R634 Abnormal weight loss: Secondary | ICD-10-CM

## 2013-10-06 DIAGNOSIS — R32 Unspecified urinary incontinence: Secondary | ICD-10-CM

## 2013-10-06 DIAGNOSIS — I10 Essential (primary) hypertension: Secondary | ICD-10-CM

## 2013-10-06 DIAGNOSIS — K5900 Constipation, unspecified: Secondary | ICD-10-CM | POA: Diagnosis not present

## 2013-10-06 DIAGNOSIS — R609 Edema, unspecified: Secondary | ICD-10-CM | POA: Diagnosis not present

## 2013-10-06 NOTE — Patient Instructions (Signed)
Increase fluid intake to amounts of about 50 oz daily. Senokot-S 2 tablets at bed. Extra fiber such as Metamucil, Citrucel, or other fiber wafers, or cereals with fiber.

## 2013-10-06 NOTE — Progress Notes (Signed)
Patient ID: Jessica Hawkins, female   DOB: [birth year], 80 y.o.   MRN: [id number]    Location:    [facility]  Place of Service:  OFFICE    Allergies  Allergen Reactions  . Cabbage     unknown    Chief Complaint  Patient presents with  . Weakness    Weakness in arms and shoulders per patient's daughter   . Constipation    Patient concerned about constipation x 1 month or longer   . Weight Loss    Patient down by 5 pounds, patient's daughters would like to make sure this is a healthy weight loss     HPI:  Constipation - chronic and distressing to patient. Has some seepage of stool with incontinence or stool.  Essential hypertension; controlled  Edema: chronic and unchanged  Unspecified urinary incontinence: leakage of urine with incontinence sometimes.  Weight ;loss: dropped from 152# in 04/28/13 to  weight of 145# on 08/18/13, and 140# today.  Medications: Patient's Medications  New Prescriptions   No medications on file  Previous Medications   ASPIRIN 81 MG TABLET    Take 81 mg by mouth daily.   BAYER MICROLET LANCETS LANCETS    Use as instructed to check blood sugar once daily.250.00   CALCIUM CARBONATE PO    Take 1 tablet by mouth daily.   FUROSEMIDE (LASIX) 40 MG TABLET    Take one tablet by mouth once daily for edema   GLUCOSE BLOOD (BAYER CONTOUR TEST) TEST STRIP    Use as instructed to check blood sugar once daily. 250.00   GLUCOSE BLOOD TEST STRIP    Test blood sugar once daily   LEVOTHYROXINE (SYNTHROID) 25 MCG TABLET    Take one tablet by mouth daily for thyroid supplement.   LOSARTAN (COZAAR) 50 MG TABLET    Take One tablet by mouth once daily to control BP   TOLTERODINE (DETROL LA) 4 MG 24 HR CAPSULE    Take one tablet once daily for bladder control  Modified Medications   No medications on file  Discontinued Medications   GUAIFENESIN (MUCINEX) 600 MG 12 HR TABLET    Take 600 mg by mouth 2 (two) times daily.   LEVOTHYROXINE (SYNTHROID) 25 MCG TABLET    Take one  tablet by mouth daily for thyroid supplement.   LOSARTAN (COZAAR) 50 MG TABLET    Take One tablet by mouth once daily to control BP     Review of Systems  Constitutional: Negative for fever, chills, activity change, appetite change, fatigue and unexpected weight change.  HENT: Negative.   Eyes: Negative.   Respiratory: Negative.  Negative for cough and shortness of breath.   Cardiovascular: Positive for leg swelling. Negative for chest pain and palpitations.  Gastrointestinal: Positive for constipation. Negative for vomiting, diarrhea and abdominal distention.       Incontinent of stool and urine sometimes  Endocrine: Negative for cold intolerance, heat intolerance, polydipsia, polyphagia and polyuria.  Genitourinary:       Incontinent  Musculoskeletal: Positive for arthralgias, back pain, gait problem, joint swelling and myalgias.  Skin: Negative.   Neurological: Negative.   Hematological: Negative.   Psychiatric/Behavioral: Negative.     Filed Vitals:   10/06/13 1557  BP: 130/72  Pulse: 79  Temp: 97.7 F (36.5 C)  TempSrc: Oral  Height: 5\' 2"  (1.575 m)  Weight: 140 lb (63.504 kg)  SpO2: 97%   Body mass index is 25.6 kg/(m^2).  Physical Exam  Constitutional:  She is oriented to person, place, and time. She appears well-developed. No distress.  frail  HENT:  Head: Normocephalic and atraumatic.  Left Ear: External ear normal.  Nose: Nose normal.  Eyes: Conjunctivae and EOM are normal. Pupils are equal, round, and reactive to light.  Neck: Normal range of motion. Neck supple. No JVD present. No tracheal deviation present. No thyromegaly present.  Cardiovascular: Normal rate, regular rhythm, normal heart sounds and intact distal pulses.  Exam reveals no gallop and no friction rub.   No murmur heard. Varicose veins.  Pulmonary/Chest: No respiratory distress. She has no wheezes. She has no rales.  Abdominal: She exhibits no distension and no mass. There is no tenderness.    Musculoskeletal: She exhibits edema and tenderness.  Poor balance. Using walker.  Lymphadenopathy:    She has no cervical adenopathy.  Neurological: She is alert and oriented to person, place, and time. No cranial nerve deficit. Coordination normal.  Skin: No rash noted. No erythema. No pallor.  Seborrheic keratoses  Psychiatric: She has a normal mood and affect. Her behavior is normal. Thought content normal.     Labs reviewed: Appointment on 08/16/2013  Component Date Value Ref Range Status  . Hemoglobin A1C 08/16/2013 6.1* 4.8 - 5.6 % Final   Comment:          Increased risk for diabetes: 5.7 - 6.4                                   Diabetes: >6.4                                   Glycemic control for adults with diabetes: <7.0  . Estimated average glucose 08/16/2013 128   Final  . Glucose 08/16/2013 98  65 - 99 mg/dL Final  . BUN 30/86/578406/15/2015 27  8 - 27 mg/dL Final  . Creatinine, Ser 08/16/2013 0.58  0.57 - 1.00 mg/dL Final  . GFR calc non Af Amer 08/16/2013 83  >59 mL/min/1.73 Final  . GFR calc Af Amer 08/16/2013 95  >59 mL/min/1.73 Final  . BUN/Creatinine Ratio 08/16/2013 47* 11 - 26 Final  . Sodium 08/16/2013 140  134 - 144 mmol/L Final  . Potassium 08/16/2013 4.6  3.5 - 5.2 mmol/L Final  . Chloride 08/16/2013 99  97 - 108 mmol/L Final  . CO2 08/16/2013 26  18 - 29 mmol/L Final  . Calcium 08/16/2013 9.4  8.7 - 10.3 mg/dL Final  . TSH 69/62/952806/15/2015 0.591  0.450 - 4.500 uIU/mL Final  . Creatinine, Ur 08/16/2013 68.0  15.0 - 278.0 mg/dL Final  . Microalbum.,U,Random 08/16/2013 58.8* 0.0 - 17.0 ug/mL Final  . MICROALB/CREAT RATIO 08/16/2013 86.5* 0.0 - 30.0 mg/g creat Final      Assessment/Plan 1. Constipation Add fiber to diet - Ambulatory referral to Gastroenterology  2. Essential hypertension controlled  3. Edema unchanged  4. Unspecified urinary incontinence Mild. Use pads or incontinence briefs  5. Loss of weight Follow. She says the loss is intentional.

## 2013-10-08 DIAGNOSIS — K5900 Constipation, unspecified: Secondary | ICD-10-CM | POA: Diagnosis not present

## 2013-10-08 DIAGNOSIS — R634 Abnormal weight loss: Secondary | ICD-10-CM | POA: Diagnosis not present

## 2013-10-13 DIAGNOSIS — R634 Abnormal weight loss: Secondary | ICD-10-CM | POA: Insufficient documentation

## 2013-11-22 ENCOUNTER — Ambulatory Visit: Payer: Medicare Other | Admitting: Podiatry

## 2013-11-24 ENCOUNTER — Other Ambulatory Visit: Payer: Medicare Other

## 2013-11-24 DIAGNOSIS — E1159 Type 2 diabetes mellitus with other circulatory complications: Secondary | ICD-10-CM | POA: Diagnosis not present

## 2013-11-24 DIAGNOSIS — E785 Hyperlipidemia, unspecified: Secondary | ICD-10-CM

## 2013-11-24 DIAGNOSIS — E039 Hypothyroidism, unspecified: Secondary | ICD-10-CM | POA: Diagnosis not present

## 2013-11-24 DIAGNOSIS — I10 Essential (primary) hypertension: Secondary | ICD-10-CM | POA: Diagnosis not present

## 2013-11-25 ENCOUNTER — Other Ambulatory Visit: Payer: Medicare Other

## 2013-11-25 LAB — COMPREHENSIVE METABOLIC PANEL
ALK PHOS: 74 IU/L (ref 39–117)
ALT: 9 IU/L (ref 0–32)
AST: 23 IU/L (ref 0–40)
Albumin/Globulin Ratio: 1.3 (ref 1.1–2.5)
Albumin: 3.9 g/dL (ref 3.5–4.7)
BILIRUBIN TOTAL: 0.7 mg/dL (ref 0.0–1.2)
BUN / CREAT RATIO: 33 — AB (ref 11–26)
BUN: 28 mg/dL — ABNORMAL HIGH (ref 8–27)
CHLORIDE: 99 mmol/L (ref 97–108)
CO2: 27 mmol/L (ref 18–29)
Calcium: 9.3 mg/dL (ref 8.7–10.3)
Creatinine, Ser: 0.84 mg/dL (ref 0.57–1.00)
GFR calc non Af Amer: 62 mL/min/{1.73_m2} (ref >59)
GFR, EST AFRICAN AMERICAN: 71 mL/min/{1.73_m2} (ref >59)
GLUCOSE: 90 mg/dL (ref 65–99)
Globulin, Total: 2.9 g/dL (ref 1.5–4.5)
Potassium: 4.7 mmol/L (ref 3.5–5.2)
Sodium: 138 mmol/L (ref 134–144)
Total Protein: 6.8 g/dL (ref 6.0–8.5)

## 2013-11-25 LAB — LIPID PANEL
CHOLESTEROL TOTAL: 152 mg/dL (ref 100–199)
Chol/HDL Ratio: 2.3 ratio units (ref 0.0–4.4)
HDL: 66 mg/dL (ref >39)
LDL Calculated: 75 mg/dL (ref 0–99)
Triglycerides: 55 mg/dL (ref 0–149)
VLDL Cholesterol Cal: 11 mg/dL (ref 5–40)

## 2013-11-25 LAB — HEMOGLOBIN A1C
Est. average glucose Bld gHb Est-mCnc: 126 mg/dL
HEMOGLOBIN A1C: 6 % — AB (ref 4.8–5.6)

## 2013-11-25 LAB — TSH: TSH: 0.797 u[IU]/mL (ref 0.450–4.500)

## 2013-12-01 ENCOUNTER — Encounter: Payer: Self-pay | Admitting: Internal Medicine

## 2013-12-01 ENCOUNTER — Ambulatory Visit (INDEPENDENT_AMBULATORY_CARE_PROVIDER_SITE_OTHER): Payer: Medicare Other | Admitting: Internal Medicine

## 2013-12-01 VITALS — BP 134/82 | HR 67 | Temp 97.9°F | Resp 10 | Ht <= 58 in | Wt 145.0 lb

## 2013-12-01 DIAGNOSIS — I10 Essential (primary) hypertension: Secondary | ICD-10-CM | POA: Diagnosis not present

## 2013-12-01 DIAGNOSIS — E1151 Type 2 diabetes mellitus with diabetic peripheral angiopathy without gangrene: Secondary | ICD-10-CM

## 2013-12-01 DIAGNOSIS — I872 Venous insufficiency (chronic) (peripheral): Secondary | ICD-10-CM | POA: Diagnosis not present

## 2013-12-01 DIAGNOSIS — Z23 Encounter for immunization: Secondary | ICD-10-CM

## 2013-12-01 DIAGNOSIS — E1159 Type 2 diabetes mellitus with other circulatory complications: Secondary | ICD-10-CM | POA: Diagnosis not present

## 2013-12-01 DIAGNOSIS — R609 Edema, unspecified: Secondary | ICD-10-CM

## 2013-12-01 DIAGNOSIS — E039 Hypothyroidism, unspecified: Secondary | ICD-10-CM | POA: Diagnosis not present

## 2013-12-01 DIAGNOSIS — R32 Unspecified urinary incontinence: Secondary | ICD-10-CM | POA: Diagnosis not present

## 2013-12-01 DIAGNOSIS — E785 Hyperlipidemia, unspecified: Secondary | ICD-10-CM

## 2013-12-01 DIAGNOSIS — K5900 Constipation, unspecified: Secondary | ICD-10-CM | POA: Diagnosis not present

## 2013-12-01 MED ORDER — PANTOPRAZOLE SODIUM 40 MG PO TBEC: DELAYED_RELEASE_TABLET | ORAL | Status: DC

## 2013-12-01 MED ORDER — LEVOTHYROXINE SODIUM 25 MCG PO TABS: ORAL_TABLET | ORAL | Status: DC

## 2013-12-01 MED ORDER — FUROSEMIDE 40 MG PO TABS: ORAL_TABLET | ORAL | Status: DC

## 2013-12-01 MED ORDER — TOLTERODINE TARTRATE ER 4 MG PO CP24: ORAL_CAPSULE | ORAL | Status: DC

## 2013-12-01 MED ORDER — LOSARTAN POTASSIUM 50 MG PO TABS: ORAL_TABLET | ORAL | Status: DC

## 2013-12-01 NOTE — Progress Notes (Signed)
Patient ID: Jessica Hawkins, female   DOB: December 20, 1922, 80 y.o.   MRN: [id number]      [facility]    Place of Service:   OFFICE    Allergies  Allergen Reactions  . Cabbage     unknown    Chief Complaint  Patient presents with  . Medical Management of Chronic Issues    3 month follow-up, discuss labs (copy printed)   . Immunizations    Flu Vaccine   . Gastrophageal Reflux    Patient would like to discuss rx for Acid Reflux (took protonix before)   . Constipation    Ongoing constipation, patient is always straining     HPI:  Type II or unspecified type diabetes mellitus with peripheral circulatory disorders, not stated as uncontrolled(250.70); controlled  Constipation: moderately severe  Hypothyroidism, unspecified hypothyroidism type - compensated  Essential hypertension - controlled  Venous insufficiency (chronic) (peripheral) - contributes to and is the main cause of peripheral edema  Edema - chronic and related to venous insufficiency  Unspecified urinary incontinence - discussed trial of Detrol  Hyperlipidemia - controlled  Type 2 diabetes with peripheral circulatory disorder, controlled - good control  Need for prophylactic vaccination and inoculation against influenza    Medications: Patient's Medications  New Prescriptions   No medications on file  Previous Medications   ASPIRIN 81 MG TABLET    Take 81 mg by mouth daily.   BAYER MICROLET LANCETS LANCETS    Use as instructed to check blood sugar once daily.250.00   CALCIUM CARBONATE PO    Take 1 tablet by mouth daily.   FUROSEMIDE (LASIX) 40 MG TABLET    Take one tablet by mouth once daily for edema   GLUCOSE BLOOD (BAYER CONTOUR TEST) TEST STRIP    Use as instructed to check blood sugar once daily. 250.00   GLUCOSE BLOOD TEST STRIP    Test blood sugar once daily   LEVOTHYROXINE (SYNTHROID) 25 MCG TABLET    Take one tablet by mouth daily for thyroid supplement.   LOSARTAN (COZAAR) 50 MG TABLET    Take One  tablet by mouth once daily to control BP   TOLTERODINE (DETROL LA) 4 MG 24 HR CAPSULE    Take one tablet once daily for bladder control  Modified Medications   No medications on file  Discontinued Medications   No medications on file     Review of Systems  Constitutional: Negative for fever, chills, activity change, appetite change, fatigue and unexpected weight change.  HENT: Negative.   Eyes: Negative.   Respiratory: Negative.  Negative for cough and shortness of breath.   Cardiovascular: Positive for leg swelling. Negative for chest pain and palpitations.  Gastrointestinal: Positive for constipation. Negative for vomiting, diarrhea and abdominal distention.       Incontinent of stool and urine sometimes  Endocrine: Negative for cold intolerance, heat intolerance, polydipsia, polyphagia and polyuria.  Genitourinary:       Incontinent  Musculoskeletal: Positive for arthralgias, back pain, gait problem, joint swelling and myalgias.  Skin: Negative.   Neurological: Negative.   Hematological: Negative.   Psychiatric/Behavioral: Negative.     Filed Vitals:   12/01/13 1600  BP: 134/82  Pulse: 67  Temp: 97.9 F (36.6 C)  TempSrc: Oral  Resp: 10  Height: 4\' 8"  (1.422 m)  Weight: 145 lb (65.772 kg)  SpO2: 99%   Body mass index is 32.53 kg/(m^2).  Physical Exam  Constitutional: She is oriented to person, place, and  time. She appears well-developed. No distress.  frail  HENT:  Head: Normocephalic and atraumatic.  Left Ear: External ear normal.  Nose: Nose normal.  Eyes: Conjunctivae and EOM are normal. Pupils are equal, round, and reactive to light.  Neck: Normal range of motion. Neck supple. No JVD present. No tracheal deviation present. No thyromegaly present.  Cardiovascular: Normal rate, regular rhythm, normal heart sounds and intact distal pulses.  Exam reveals no gallop and no friction rub.   No murmur heard. Varicose veins.  Pulmonary/Chest: No respiratory distress.  She has no wheezes. She has no rales.  Abdominal: She exhibits no distension and no mass. There is no tenderness.  Musculoskeletal: She exhibits edema and tenderness.  Poor balance. Using walker.  Lymphadenopathy:    She has no cervical adenopathy.  Neurological: She is alert and oriented to person, place, and time. No cranial nerve deficit. Coordination normal.  Skin: No rash noted. No erythema. No pallor.  Seborrheic keratoses  Psychiatric: She has a normal mood and affect. Her behavior is normal. Thought content normal.     Labs reviewed: Appointment on 11/24/2013  Component Date Value Ref Range Status  . Hemoglobin A1C 11/24/2013 6.0* 4.8 - 5.6 % Final   Comment:          Increased risk for diabetes: 5.7 - 6.4                                   Diabetes: >6.4                                   Glycemic control for adults with diabetes: <7.0  . Estimated average glucose 11/24/2013 126   Final  . Glucose 11/24/2013 90  65 - 99 mg/dL Final  . BUN 40/98/119109/23/2015 28* 8 - 27 mg/dL Final  . Creatinine, Ser 11/24/2013 0.84  0.57 - 1.00 mg/dL Final  . GFR calc non Af Amer 11/24/2013 62  >59 mL/min/1.73 Final  . GFR calc Af Amer 11/24/2013 71  >59 mL/min/1.73 Final  . BUN/Creatinine Ratio 11/24/2013 33* 11 - 26 Final  . Sodium 11/24/2013 138  134 - 144 mmol/L Final  . Potassium 11/24/2013 4.7  3.5 - 5.2 mmol/L Final  . Chloride 11/24/2013 99  97 - 108 mmol/L Final  . CO2 11/24/2013 27  18 - 29 mmol/L Final  . Calcium 11/24/2013 9.3  8.7 - 10.3 mg/dL Final  . Total Protein 11/24/2013 6.8  6.0 - 8.5 g/dL Final  . Albumin 47/82/956209/23/2015 3.9  3.5 - 4.7 g/dL Final  . Globulin, Total 11/24/2013 2.9  1.5 - 4.5 g/dL Final  . Albumin/Globulin Ratio 11/24/2013 1.3  1.1 - 2.5 Final  . Total Bilirubin 11/24/2013 0.7  0.0 - 1.2 mg/dL Final  . Alkaline Phosphatase 11/24/2013 74  39 - 117 IU/L Final  . AST 11/24/2013 23  0 - 40 IU/L Final  . ALT 11/24/2013 9  0 - 32 IU/L Final  . Cholesterol, Total  11/24/2013 152  100 - 199 mg/dL Final  . Triglycerides 11/24/2013 55  0 - 149 mg/dL Final  . HDL 13/08/657809/23/2015 66  >39 mg/dL Final   Comment: According to ATP-III Guidelines, HDL-C >59 mg/dL is considered a  negative risk factor for CHD.  Marland Kitchen VLDL Cholesterol Cal 11/24/2013 11  5 - 40 mg/dL Final  . LDL Calculated 11/24/2013 75  0 - 99 mg/dL Final  . Chol/HDL Ratio 11/24/2013 2.3  0.0 - 4.4 ratio units Final   Comment:                                   T. Chol/HDL Ratio                                                                      Men  Women                                                        1/2 Avg.Risk  3.4    3.3                                                            Avg.Risk  5.0    4.4                                                         2X Avg.Risk  9.6    7.1                                                         3X Avg.Risk 23.4   11.0  . TSH 11/24/2013 0.797  0.450 - 4.500 uIU/mL Final     Assessment/Plan 1. Type II or unspecified type diabetes mellitus with peripheral circulatory disorders, not stated as uncontrolled(250.70) controlled  2. Constipation Discussed OTC laxatives. Stressed increase in fluids and fiber.  3. Hypothyroidism, unspecified hypothyroidism type - TSH; Future  4. Essential hypertension - EKG 12-Lead; Future  5. Venous insufficiency (chronic) (peripheral) Chronic and will not likely respond well to increasing diuretics. Continue compression hosiery  6. Edema - furosemide (LASIX) 40 MG tablet; Take one tablet by mouth once daily for edema  Dispense: 90 tablet; Refill: 3  7. Unspecified urinary incontinence - tolterodine (DETROL LA) 4 MG 24 hr capsule; Take one tablet once daily for bladder control  Dispense: 90 capsule; Refill: 3  8. Hyperlipidemia controlled - Lipid panel; Future  9. Type 2 diabetes with peripheral circulatory disorder, controlled controlled - Hemoglobin A1c; Future - Comprehensive  metabolic panel; Future - Microalbumin, urine; Future  10. Need for prophylactic vaccination and inoculation against influenza Flu vaccine administered

## 2013-12-02 LAB — HM DIABETES EYE EXAM

## 2013-12-14 ENCOUNTER — Encounter: Payer: Self-pay | Admitting: Internal Medicine

## 2013-12-17 ENCOUNTER — Other Ambulatory Visit: Payer: Self-pay | Admitting: *Deleted

## 2013-12-17 MED ORDER — PANTOPRAZOLE SODIUM 40 MG PO TBEC: DELAYED_RELEASE_TABLET | ORAL | Status: DC

## 2013-12-17 NOTE — Telephone Encounter (Signed)
[name],daughter called and stated that patient could not get her "stomach" medication at the pharmacy. I called the pharmacy and spoke with pharmacist and she stated that she will get her medication ready and it would be a $12 copay. [name] Notified. # [phone]

## 2014-01-12 ENCOUNTER — Encounter: Payer: Self-pay | Admitting: Internal Medicine

## 2014-01-12 ENCOUNTER — Ambulatory Visit (INDEPENDENT_AMBULATORY_CARE_PROVIDER_SITE_OTHER): Payer: Medicare Other | Admitting: Internal Medicine

## 2014-01-12 VITALS — BP 120/70 | HR 73 | Temp 96.5°F | Ht 62.0 in | Wt 148.4 lb

## 2014-01-12 DIAGNOSIS — I10 Essential (primary) hypertension: Secondary | ICD-10-CM | POA: Diagnosis not present

## 2014-01-12 DIAGNOSIS — I798 Other disorders of arteries, arterioles and capillaries in diseases classified elsewhere: Secondary | ICD-10-CM

## 2014-01-12 DIAGNOSIS — E785 Hyperlipidemia, unspecified: Secondary | ICD-10-CM

## 2014-01-12 DIAGNOSIS — R269 Unspecified abnormalities of gait and mobility: Secondary | ICD-10-CM

## 2014-01-12 DIAGNOSIS — M1990 Unspecified osteoarthritis, unspecified site: Secondary | ICD-10-CM

## 2014-01-12 DIAGNOSIS — E039 Hypothyroidism, unspecified: Secondary | ICD-10-CM

## 2014-01-12 DIAGNOSIS — M25561 Pain in right knee: Secondary | ICD-10-CM

## 2014-01-12 DIAGNOSIS — E1151 Type 2 diabetes mellitus with diabetic peripheral angiopathy without gangrene: Secondary | ICD-10-CM | POA: Diagnosis not present

## 2014-01-12 DIAGNOSIS — R609 Edema, unspecified: Secondary | ICD-10-CM

## 2014-01-12 NOTE — Patient Instructions (Signed)
Come for lab prior to your next visit.

## 2014-01-12 NOTE — Progress Notes (Signed)
Patient ID: Jessica MunchPauline G Hawkins, female   DOB: June 21, 1922, 80 y.o.   MRN: [id number]    Facility  [facility]    Place of Service:   OFFICE   Allergies  Allergen Reactions  . Cabbage     unknown    Chief Complaint  Patient presents with  . Acute Visit    Pt. complaining with swelling in leg's.    HPI:  Edema: increasing. Related to chronic venous insufficiency  Essential hypertension: controlled  Abnormality of gait: using walker  Arthritis: back, knees and hips  Controlled type 2 DM with peripheral circulatory disorder: controlled  Pain in joint, lower leg, rightL chronic  Hypothyroidism, unspecified hypothyroidism type: recheck next visit  Hyperlipidemia: recheck next visit    Medications: Patient's Medications  New Prescriptions   No medications on file  Previous Medications   ASPIRIN 81 MG TABLET    Take 81 mg by mouth daily.   BAYER MICROLET LANCETS LANCETS    Use as instructed to check blood sugar once daily.250.00   CALCIUM CARBONATE PO    Take 1 tablet by mouth daily.   FUROSEMIDE (LASIX) 40 MG TABLET    Take one tablet by mouth once daily for edema   GLUCOSE BLOOD (BAYER CONTOUR TEST) TEST STRIP    Use as instructed to check blood sugar once daily. 250.00   GLUCOSE BLOOD TEST STRIP    Test blood sugar once daily   LEVOTHYROXINE (SYNTHROID) 25 MCG TABLET    Take one tablet by mouth daily for thyroid supplement.   LOSARTAN (COZAAR) 50 MG TABLET    Take One tablet by mouth once daily to control BP   MULTIVITAMIN-IRON-MINERALS-FOLIC ACID (CENTRUM) CHEWABLE TABLET    Chew 1 tablet by mouth daily.   PANTOPRAZOLE (PROTONIX) 40 MG TABLET    Take 1 tablet by mouth daily for stomach.   TOLTERODINE (DETROL LA) 4 MG 24 HR CAPSULE    Take one tablet once daily for bladder control  Modified Medications   No medications on file  Discontinued Medications   No medications on file     Review of Systems  Constitutional: Negative for fever, chills, activity change, appetite  change, fatigue and unexpected weight change.  HENT: Negative.   Eyes: Negative.   Respiratory: Negative.  Negative for cough and shortness of breath.   Cardiovascular: Positive for leg swelling. Negative for chest pain and palpitations.  Gastrointestinal: Positive for constipation. Negative for vomiting, diarrhea and abdominal distention.       Incontinent of stool and urine sometimes  Endocrine: Negative for cold intolerance, heat intolerance, polydipsia, polyphagia and polyuria.  Genitourinary:       Incontinent  Musculoskeletal: Positive for myalgias, back pain, joint swelling, arthralgias and gait problem.  Skin: Negative.   Neurological: Negative.   Hematological: Negative.   Psychiatric/Behavioral: Negative.     Filed Vitals:   01/12/14 1357  BP: 120/70  Pulse: 73  Temp: 96.5 F (35.8 C)  TempSrc: Oral  Height: 5\' 2"  (1.575 m)  Weight: 148 lb 6.4 oz (67.314 kg)  SpO2: 97%   Body mass index is 27.14 kg/(m^2).  Physical Exam  Constitutional: She is oriented to person, place, and time. She appears well-developed. No distress.  frail  HENT:  Head: Normocephalic and atraumatic.  Left Ear: External ear normal.  Nose: Nose normal.  Eyes: Conjunctivae and EOM are normal. Pupils are equal, round, and reactive to light.  Neck: Normal range of motion. Neck supple. No JVD present. No  tracheal deviation present. No thyromegaly present.  Cardiovascular: Normal rate, regular rhythm, normal heart sounds and intact distal pulses.  Exam reveals no gallop and no friction rub.   No murmur heard. Varicose veins.  Pulmonary/Chest: No respiratory distress. She has no wheezes. She has no rales.  Abdominal: She exhibits no distension and no mass. There is no tenderness.  Musculoskeletal: She exhibits edema and tenderness.  Poor balance. Using walker.  Lymphadenopathy:    She has no cervical adenopathy.  Neurological: She is alert and oriented to person, place, and time. No cranial nerve  deficit. Coordination normal.  Skin: No rash noted. No erythema. No pallor.  Seborrheic keratoses  Psychiatric: She has a normal mood and affect. Her behavior is normal. Thought content normal.     Labs reviewed: Abstract on 12/14/2013  Component Date Value Ref Range Status  . HM Diabetic Eye Exam 12/02/2013 No Retinopathy  No Retinopathy Final   London SheerPeter Dunn, OD  Appointment on 11/24/2013  Component Date Value Ref Range Status  . Hgb A1c MFr Bld 11/24/2013 6.0* 4.8 - 5.6 % Final   Comment:          Increased risk for diabetes: 5.7 - 6.4                                   Diabetes: >6.4                                   Glycemic control for adults with diabetes: <7.0  . Est. average glucose Bld gHb Est-m* 11/24/2013 126   Final  . Glucose 11/24/2013 90  65 - 99 mg/dL Final  . BUN 40/98/119109/23/2015 28* 8 - 27 mg/dL Final  . Creatinine, Ser 11/24/2013 0.84  0.57 - 1.00 mg/dL Final  . GFR calc non Af Amer 11/24/2013 62  >59 mL/min/1.73 Final  . GFR calc Af Amer 11/24/2013 71  >59 mL/min/1.73 Final  . BUN/Creatinine Ratio 11/24/2013 33* 11 - 26 Final  . Sodium 11/24/2013 138  134 - 144 mmol/L Final  . Potassium 11/24/2013 4.7  3.5 - 5.2 mmol/L Final  . Chloride 11/24/2013 99  97 - 108 mmol/L Final  . CO2 11/24/2013 27  18 - 29 mmol/L Final  . Calcium 11/24/2013 9.3  8.7 - 10.3 mg/dL Final  . Total Protein 11/24/2013 6.8  6.0 - 8.5 g/dL Final  . Albumin 47/82/956209/23/2015 3.9  3.5 - 4.7 g/dL Final  . Globulin, Total 11/24/2013 2.9  1.5 - 4.5 g/dL Final  . Albumin/Globulin Ratio 11/24/2013 1.3  1.1 - 2.5 Final  . Total Bilirubin 11/24/2013 0.7  0.0 - 1.2 mg/dL Final  . Alkaline Phosphatase 11/24/2013 74  39 - 117 IU/L Final  . AST 11/24/2013 23  0 - 40 IU/L Final  . ALT 11/24/2013 9  0 - 32 IU/L Final  . Cholesterol, Total 11/24/2013 152  100 - 199 mg/dL Final  . Triglycerides 11/24/2013 55  0 - 149 mg/dL Final  . HDL 13/08/657809/23/2015 66  >39 mg/dL Final   Comment: According to ATP-III Guidelines, HDL-C  >59 mg/dL is considered a                          negative risk factor for CHD.  Marland Kitchen. VLDL Cholesterol Cal 11/24/2013 11  5 - 40 mg/dL  Final  . LDL Calculated 11/24/2013 75  0 - 99 mg/dL Final  . Chol/HDL Ratio 11/24/2013 2.3  0.0 - 4.4 ratio units Final   Comment:                                   T. Chol/HDL Ratio                                                                      Men  Women                                                        1/2 Avg.Risk  3.4    3.3                                                            Avg.Risk  5.0    4.4                                                         2X Avg.Risk  9.6    7.1                                                         3X Avg.Risk 23.4   11.0  . TSH 11/24/2013 0.797  0.450 - 4.500 uIU/mL Final     Assessment/Plan 1. Edema Continue diuretic. Get compression stockings. Hospital bed to elevate legs.  2. Essential hypertension controlled  3. Abnormality of gait continue walker  4. Arthritis unchanged  5. Controlled type 2 DM with peripheral circulatory disorder -A1c, CMP, future  6. Pain in joint, lower leg, right chronic  7. Hypothyroidism, unspecified hypothyroidism type -TSH, future  8. Hyperlipidemia -lipids, future

## 2014-01-13 ENCOUNTER — Other Ambulatory Visit: Payer: Self-pay

## 2014-01-13 DIAGNOSIS — Z1231 Encounter for screening mammogram for malignant neoplasm of breast: Secondary | ICD-10-CM

## 2014-01-19 ENCOUNTER — Telehealth: Payer: Self-pay | Admitting: *Deleted

## 2014-01-19 NOTE — Telephone Encounter (Signed)
Jessica MouldingRuth, Daughter called and stated that Patient was seen last Wednesday and was prescribed compression stockings but patient now has a sore on left leg. Sore is not red or painful. Patient just wants to make sure it is still ok to wear the compression stockings with the sore. Please Advise.

## 2014-01-20 NOTE — Telephone Encounter (Signed)
Jessica Hawkins, daughter Notified.

## 2014-01-20 NOTE — Telephone Encounter (Signed)
Cover the sore with bandage and then use the stockings overe the bandage.

## 2014-02-11 ENCOUNTER — Ambulatory Visit
Admission: RE | Admit: 2014-02-11 | Discharge: 2014-02-11 | Disposition: A | Discharge status: Home / Self Care | Payer: Medicare Other | Source: Ambulatory Visit

## 2014-02-11 ENCOUNTER — Encounter (INDEPENDENT_AMBULATORY_CARE_PROVIDER_SITE_OTHER): Payer: Self-pay

## 2014-02-11 ENCOUNTER — Other Ambulatory Visit: Payer: Self-pay

## 2014-02-11 DIAGNOSIS — Z1231 Encounter for screening mammogram for malignant neoplasm of breast: Secondary | ICD-10-CM | POA: Diagnosis not present

## 2014-02-21 ENCOUNTER — Telehealth: Payer: Self-pay | Admitting: *Deleted

## 2014-02-21 DIAGNOSIS — Z0110 Encounter for examination of ears and hearing without abnormal findings: Secondary | ICD-10-CM

## 2014-02-21 NOTE — Telephone Encounter (Signed)
Patient daughter, Crist FatFern called and stated that they need a referral to [facility] for a hearing test so they can get patient's hearing aid fixed. Please Advise.

## 2014-02-22 NOTE — Telephone Encounter (Signed)
Per Dr. Chilton SiGreen it is ok to refer to Audiology. Referral Placed and daughter Notified.

## 2014-02-28 ENCOUNTER — Encounter: Payer: Self-pay | Admitting: Podiatry

## 2014-02-28 ENCOUNTER — Ambulatory Visit (INDEPENDENT_AMBULATORY_CARE_PROVIDER_SITE_OTHER): Payer: Medicare Other | Admitting: Podiatry

## 2014-02-28 DIAGNOSIS — M79676 Pain in unspecified toe(s): Secondary | ICD-10-CM | POA: Diagnosis not present

## 2014-02-28 DIAGNOSIS — B351 Tinea unguium: Secondary | ICD-10-CM | POA: Diagnosis not present

## 2014-02-28 DIAGNOSIS — E1151 Type 2 diabetes mellitus with diabetic peripheral angiopathy without gangrene: Secondary | ICD-10-CM | POA: Diagnosis not present

## 2014-02-28 DIAGNOSIS — I798 Other disorders of arteries, arterioles and capillaries in diseases classified elsewhere: Secondary | ICD-10-CM | POA: Diagnosis not present

## 2014-02-28 DIAGNOSIS — L84 Corns and callosities: Secondary | ICD-10-CM

## 2014-02-28 NOTE — Progress Notes (Signed)
Patient ID: Jessica Hawkins, female   DOB: [birth year], 80 y.o.   MRN: [id number]  Subjective: Patient presents complaining of painful toenails and keratoses. She is a diabetic and was advised present at three-month intervals, however, her last visit was 08/16/2013. Her daughter who is with her today is her power of attorney and is requesting a diabetic foot exam in the near future for possible disability.  Objective: The toenails are elongated, hypertrophic, brittle, discolored and tender to palpation 6-10 Hemorrhagic distal keratoses third toes bilaterally Plantar keratoses left first MPJ   Assessment: Symptomatic onychomycoses 6-10 Keratoses multiple History of peripheral arterial disease  Plan: Debride nails 10 keratoses 3 without a bleeding  Reappoint at three-month intervals for skin a nail debridement  Reappoint at request of patient's daughter for diabetic foot exam

## 2014-03-02 ENCOUNTER — Other Ambulatory Visit: Payer: Self-pay

## 2014-03-02 MED ORDER — GLUCOSE BLOOD VI STRP: ORAL_STRIP | Status: DC

## 2014-03-02 MED ORDER — BAYER MICROLET LANCETS MISC: Status: DC

## 2014-03-02 NOTE — Telephone Encounter (Signed)
Patient called to request refill on diabetic testing supplies

## 2014-03-04 IMAGING — CR DG TIBIA/FIBULA 2V*R*
4 series · 4 of 4 positions shown · non-contrast
Comparison: None.

CLINICAL DATA: Open wound.  Question osteomyelitis.

RIGHT TIBIA AND FIBULA - 2 VIEW

[view not recorded (1 of 4)]
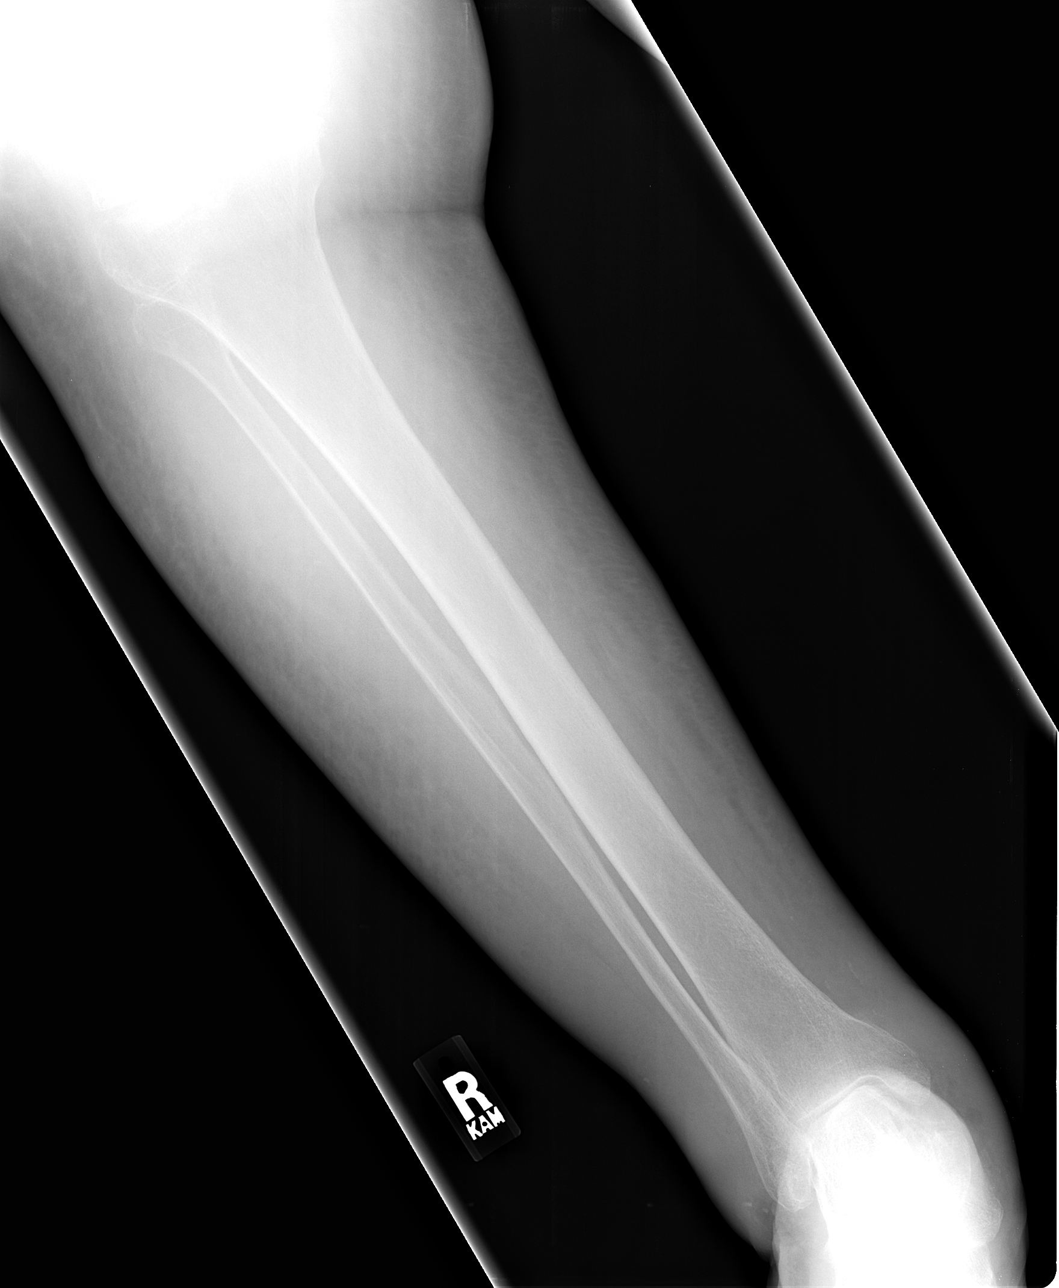

[view not recorded (2 of 4)]
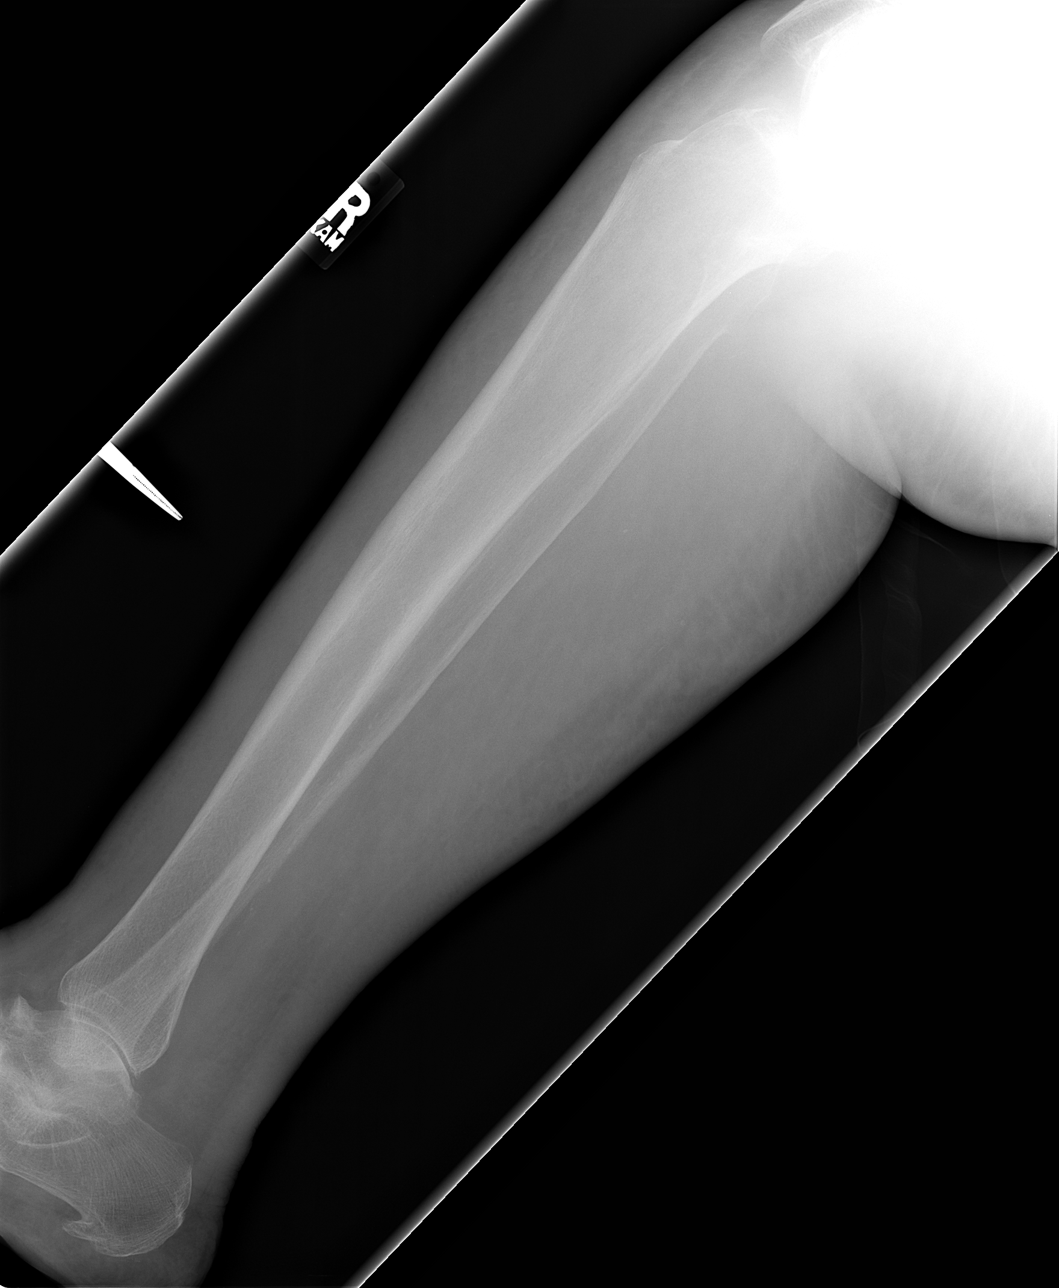

[view not recorded (3 of 4)]
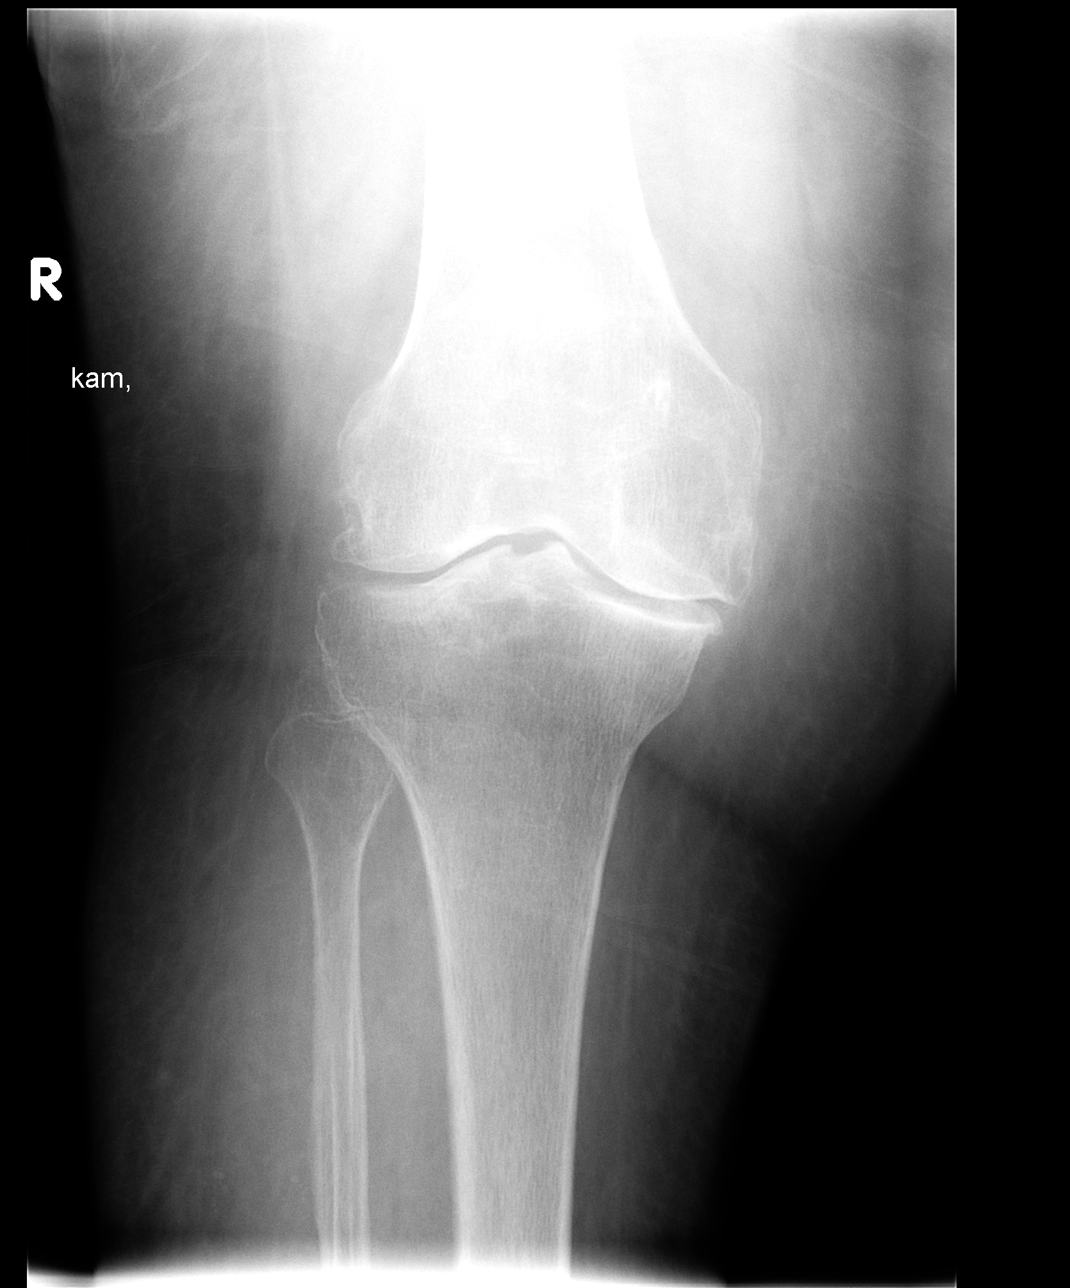

[view not recorded (4 of 4)]
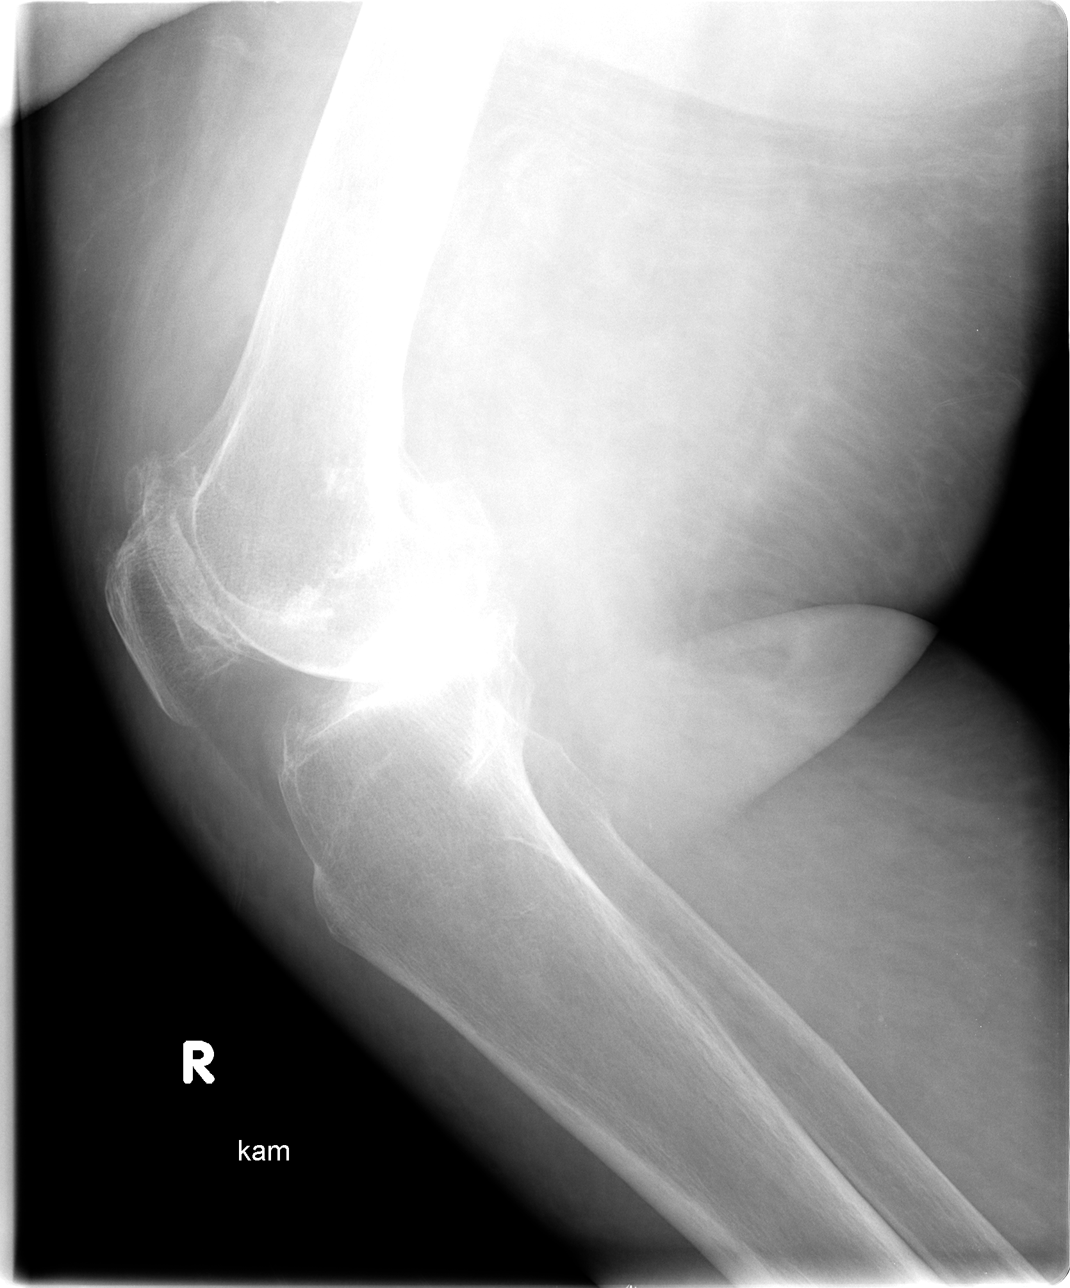

[4 of 4 positions shown; findings below may reference images not displayed]

FINDINGS: Advanced degenerative changes in the right knee with
joint space narrowing and osteophyte formation.  Degenerative
changes in the right ankle.  Diffuse osteopenia.  No bony changes
to suggest osteomyelitis.
IMPRESSION: No acute findings.

## 2014-03-07 ENCOUNTER — Ambulatory Visit (INDEPENDENT_AMBULATORY_CARE_PROVIDER_SITE_OTHER): Payer: Medicare Other | Admitting: Podiatry

## 2014-03-07 DIAGNOSIS — E1151 Type 2 diabetes mellitus with diabetic peripheral angiopathy without gangrene: Secondary | ICD-10-CM

## 2014-03-07 NOTE — Patient Instructions (Signed)
Diabetes and Foot Care Diabetes may cause you to have problems because of poor blood supply (circulation) to your feet and legs. This may cause the skin on your feet to become thinner, break easier, and heal more slowly. Your skin may become dry, and the skin may peel and crack. You may also have nerve damage in your legs and feet causing decreased feeling in them. You may not notice minor injuries to your feet that could lead to infections or more serious problems. Taking care of your feet is one of the most important things you can do for yourself.  HOME CARE INSTRUCTIONS  Wear shoes at all times, even in the house. Do not go barefoot. Bare feet are easily injured.  Check your feet daily for blisters, cuts, and redness. If you cannot see the bottom of your feet, use a mirror or ask someone for help.  Wash your feet with warm water (do not use hot water) and mild soap. Then pat your feet and the areas between your toes until they are completely dry. Do not soak your feet as this can dry your skin.  Apply a moisturizing lotion or petroleum jelly (that does not contain alcohol and is unscented) to the skin on your feet and to dry, brittle toenails. Do not apply lotion between your toes.  Trim your toenails straight across. Do not dig under them or around the cuticle. File the edges of your nails with an emery board or nail file.  Do not cut corns or calluses or try to remove them with medicine.  Wear clean socks or stockings every day. Make sure they are not too tight. Do not wear knee-high stockings since they may decrease blood flow to your legs.  Wear shoes that fit properly and have enough cushioning. To break in new shoes, wear them for just a few hours a day. This prevents you from injuring your feet. Always look in your shoes before you put them on to be sure there are no objects inside.  Do not cross your legs. This may decrease the blood flow to your feet.  If you find a minor scrape,  cut, or break in the skin on your feet, keep it and the skin around it clean and dry. These areas may be cleansed with mild soap and water. Do not cleanse the area with peroxide, alcohol, or iodine.  When you remove an adhesive bandage, be sure not to damage the skin around it.  If you have a wound, look at it several times a day to make sure it is healing.  Do not use heating pads or hot water bottles. They may burn your skin. If you have lost feeling in your feet or legs, you may not know it is happening until it is too late.  Make sure your health care provider performs a complete foot exam at least annually or more often if you have foot problems. Report any cuts, sores, or bruises to your health care provider immediately. SEEK MEDICAL CARE IF:   You have an injury that is not healing.  You have cuts or breaks in the skin.  You have an ingrown nail.  You notice redness on your legs or feet.  You feel burning or tingling in your legs or feet.  You have pain or cramps in your legs and feet.  Your legs or feet are numb.  Your feet always feel cold. SEEK IMMEDIATE MEDICAL CARE IF:   There is increasing redness,   swelling, or pain in or around a wound.  There is a red line that goes up your leg.  Pus is coming from a wound.  You develop a fever or as directed by your health care provider.  You notice a bad smell coming from an ulcer or wound. Document Released: 02/16/2000 Document Revised: 10/21/2012 Document Reviewed: 07/28/2012 Exit[employer] Patient Information 2015 [employer]. This information is not intended to replace advice given to you by your health care provider. Make sure you discuss any questions you have with your health care provider.  

## 2014-03-08 NOTE — Progress Notes (Signed)
Patient ID: Gerhard MunchPauline G Erskin, female   DOB: 1922-08-15, 81 y.o.   MRN: [id number]  Subjective: This patient presents after the visit of 02/28/2014 with her daughter present who is requesting detailed examination in for the purpose of possibly obtaining disability and/or additional home health. Patient has difficulty walking and standing. She has  history of diabetes.  Objective: Patient appears to be orientated and responsive to questions  Vascular: DP pulses 2/4 bilaterally PT pulses 1/4 bilaterally Pitting edema ankles bilaterally  Neurological: Vibratory sensation nonreactive bilaterally Ankle reflex equal and reactive bilaterally Sensation to 10 g monofilament wire intact 3/5 bilaterally  Dermatological: Keratoses distal third right toe, plantar second MPJ and first MPJ right Keratoses plantar left first MPJ and third left toe distally The toenails are incurvated, hypertrophic, discolored  Musculoskeletal: Unsteady gait pattern requiring a roller walker Pes planus bilaterally Limited subtalar joint bilaterally Maximally pronated feet upon weight-bearing bilaterally HAV deformity bilaterally Hammertoe second right  Assessment: Diminished pedal pulses suggestive peripheral arterial disease Peripheral neuropathy Pes planus bilaterally Gait disturbance bilaterally Mycotic toenails bilaterally Keratoses bilaterally  Return patient at three-month intervals for debridement of skin and nails

## 2014-04-06 ENCOUNTER — Ambulatory Visit (INDEPENDENT_AMBULATORY_CARE_PROVIDER_SITE_OTHER): Payer: Medicare Other | Admitting: Internal Medicine

## 2014-04-06 ENCOUNTER — Encounter: Payer: Self-pay | Admitting: Internal Medicine

## 2014-04-06 VITALS — BP 130/60 | HR 79 | Temp 98.3°F | Resp 20 | Ht 62.0 in | Wt 148.0 lb

## 2014-04-06 DIAGNOSIS — R269 Unspecified abnormalities of gait and mobility: Secondary | ICD-10-CM | POA: Diagnosis not present

## 2014-04-06 DIAGNOSIS — I10 Essential (primary) hypertension: Secondary | ICD-10-CM

## 2014-04-06 DIAGNOSIS — I872 Venous insufficiency (chronic) (peripheral): Secondary | ICD-10-CM | POA: Diagnosis not present

## 2014-04-06 DIAGNOSIS — R609 Edema, unspecified: Secondary | ICD-10-CM

## 2014-04-06 DIAGNOSIS — M25561 Pain in right knee: Secondary | ICD-10-CM

## 2014-04-06 NOTE — Progress Notes (Signed)
Patient ID: Jessica Hawkins, female   DOB: Jun 26, 1922, 81 y.o.   MRN: [id number]    Facility  [facility]    Place of Service:   OFFICE   Allergies  Allergen Reactions  . Cabbage     unknown    Chief Complaint  Patient presents with  . Acute Visit    still having issues with legs and knees    HPI:  Pain in joint, lower leg, right: Is having knee pains. They're worse on the right side but are present in both knees. She has been to see surgeons at [facility] as well as Dr. Wyline Mood in [place]. She is felt to be a high risk candidate due to age and peripheral edema secondary to venous insufficiency. Pains are quite disabling at the present time, but patient and her daughter who is with her today, state that she has slowed down and quit doing some of her physical activities as well as her exercise program with water therapy and at the [employer] program. Patient did not want to have additional surgery in the past, but she has reconsidered now and is seeking advice as to whether it is reasonable to try to continue contacts with orthopedist to see if one will do necessary surgery to help her feel better and become more mobile.  Venous insufficiency (chronic) (peripheral): Patient has chronic venous insufficiency bilaterally which has led to significant edema in both legs. She is not using compression stockings and taking a diuretic, furosemide 40 mg daily. Has improved somewhat. She says that her legs do go down overnight but began to swell soon after getting up each day. She is asking about zippered compression stockings due to difficulty in getting the standard impression stocking on her leg.  Abnormality of gait: Patient is using a four-wheel walker  Edema: Secondary to venous insufficiency  Essential hypertension: Controlled    Medications: Patient's Medications  New Prescriptions   No medications on file  Previous Medications   ASPIRIN 81 MG TABLET    Take 81 mg by mouth daily.   BAYER MICROLET LANCETS LANCETS    Use as instructed to check blood sugar once daily.250.00   CALCIUM CARBONATE PO    Take 1 tablet by mouth daily.   FUROSEMIDE (LASIX) 40 MG TABLET    Take one tablet by mouth once daily for edema   GLUCOSE BLOOD (BAYER CONTOUR TEST) TEST STRIP    Use as instructed to check blood sugar once daily. 250.00   GLUCOSE BLOOD TEST STRIP    Test blood sugar once daily   LEVOTHYROXINE (SYNTHROID) 25 MCG TABLET    Take one tablet by mouth daily for thyroid supplement.   LOSARTAN (COZAAR) 50 MG TABLET    Take One tablet by mouth once daily to control BP   MULTIVITAMIN-IRON-MINERALS-FOLIC ACID (CENTRUM) CHEWABLE TABLET    Chew 1 tablet by mouth daily.   PANTOPRAZOLE (PROTONIX) 40 MG TABLET    Take 1 tablet by mouth daily for stomach.   TOLTERODINE (DETROL LA) 4 MG 24 HR CAPSULE    Take one tablet once daily for bladder control  Modified Medications   No medications on file  Discontinued Medications   No medications on file     Review of Systems  Constitutional: Negative for fever, chills, activity change, appetite change, fatigue and unexpected weight change.  HENT: Negative.   Eyes: Negative.   Respiratory: Negative.  Negative for cough and shortness of breath.   Cardiovascular: Positive for leg  swelling. Negative for chest pain and palpitations.  Gastrointestinal: Positive for constipation. Negative for vomiting, diarrhea and abdominal distention.       Incontinent of stool and urine sometimes  Endocrine: Negative for cold intolerance, heat intolerance, polydipsia, polyphagia and polyuria.  Genitourinary:       Incontinent  Musculoskeletal: Positive for myalgias, back pain, joint swelling, arthralgias and gait problem.  Skin: Negative.   Neurological: Negative.   Hematological: Negative.   Psychiatric/Behavioral: Negative.     Filed Vitals:   04/06/14 1530  BP: 130/60  Pulse: 79  Temp: 98.3 F (36.8 C)  TempSrc: Oral  Resp: 20  Height: 5\' 2"  (1.575  m)  Weight: 148 lb (67.132 kg)  SpO2: 99%   Body mass index is 27.06 kg/(m^2).  Physical Exam  Constitutional: She is oriented to person, place, and time. She appears well-developed. No distress.  frail  HENT:  Head: Normocephalic and atraumatic.  Left Ear: External ear normal.  Nose: Nose normal.  Eyes: Conjunctivae and EOM are normal. Pupils are equal, round, and reactive to light.  Neck: Normal range of motion. Neck supple. No JVD present. No tracheal deviation present. No thyromegaly present.  Cardiovascular: Normal rate, regular rhythm, normal heart sounds and intact distal pulses.  Exam reveals no gallop and no friction rub.   No murmur heard. Varicose veins.  Pulmonary/Chest: No respiratory distress. She has no wheezes. She has no rales.  Abdominal: She exhibits no distension and no mass. There is no tenderness.  Musculoskeletal: She exhibits edema and tenderness.  Poor balance. Using walker.  Lymphadenopathy:    She has no cervical adenopathy.  Neurological: She is alert and oriented to person, place, and time. No cranial nerve deficit. Coordination normal.  Skin: No rash noted. No erythema. No pallor.  Seborrheic keratoses  Psychiatric: She has a normal mood and affect. Her behavior is normal. Thought content normal.     Labs reviewed: No visits with results within 3 Month(s) from this visit. Latest known visit with results is:  Abstract on 12/14/2013  Component Date Value Ref Range Status  . HM Diabetic Eye Exam 12/02/2013 No Retinopathy  No Retinopathy Final   Jessica Hawkins, OD     Assessment/Plan 1. Pain in joint, lower leg, right Advised patient that she should get back in her physical therapy with the [facility] and water therapy. She will try zippered compression stockings. She is to return 05/10/14 for further evaluation. If she is feeling stronger at that time, I think we should get her another appointment with the orthopedist for reevaluation of her  potential to have surgery. Advise the patient and her daughter that prior to surgery, she should have arterial studies with ABI to assess arterial circulation. She should have a cardiology preoperative evaluation as well.  2. Venous insufficiency (chronic) (peripheral) Elevate leg slightly at night. Use compression stockings.  3. Abnormality of gait Continue to use walker  4. Edema Continue furosemide, leg elevation, and compression stockings  5. Essential hypertension Controlled

## 2014-04-06 NOTE — Patient Instructions (Signed)
Resume water exercise therapy. Try zipper compression stockings. If we can find a surgeon willing to consider surgery, you will need to get ABI to check arterial circulation and a cardiology referral for a preoperative check.

## 2014-04-21 ENCOUNTER — Telehealth: Payer: Self-pay | Admitting: *Deleted

## 2014-04-21 NOTE — Telephone Encounter (Signed)
Patient caregiver, Crist FatFern called and stated that you had talked with them regarding some Zipper Compression Hose and they called the supply store [facility]  and they told her they would need a Rx and measurements. Please Advise.

## 2014-04-22 MED ORDER — AMBULATORY NON FORMULARY MEDICATION: Status: DC

## 2014-04-22 NOTE — Telephone Encounter (Signed)
She needs knee high compression hose with zippers for both legs. Please send the prescription. She will have to go there to get the legs measured.

## 2014-04-25 NOTE — Telephone Encounter (Signed)
LMOM to return call.

## 2014-05-04 MED ORDER — AMBULATORY NON FORMULARY MEDICATION: Status: DC

## 2014-05-04 NOTE — Telephone Encounter (Signed)
Message left on triage voicemail, please call to discuss order request for compression hose. Daughter states she called about this a couple weeks ago and it still is not resolved. Order request needs to be sent to [facility]   I called patients daughter back, order will be faxed. Daughter gave me the number of [phone]. I called [facility] and got fax number of [phone].  Order faxed  Crist FatFern (patient's daughter aware)

## 2014-05-04 NOTE — Addendum Note (Signed)
Addended by: Maurice SmallBEATTY, Cecile Gillispie C on: 05/04/2014 04:40 PM   Modules accepted: Orders

## 2014-05-05 ENCOUNTER — Other Ambulatory Visit: Payer: Medicare Other

## 2014-05-05 DIAGNOSIS — E039 Hypothyroidism, unspecified: Secondary | ICD-10-CM | POA: Diagnosis not present

## 2014-05-05 DIAGNOSIS — E1151 Type 2 diabetes mellitus with diabetic peripheral angiopathy without gangrene: Secondary | ICD-10-CM

## 2014-05-05 DIAGNOSIS — E785 Hyperlipidemia, unspecified: Secondary | ICD-10-CM | POA: Diagnosis not present

## 2014-05-05 DIAGNOSIS — I798 Other disorders of arteries, arterioles and capillaries in diseases classified elsewhere: Secondary | ICD-10-CM | POA: Diagnosis not present

## 2014-05-06 LAB — COMPREHENSIVE METABOLIC PANEL
ALBUMIN: 4 g/dL (ref 3.5–4.7)
ALK PHOS: 95 IU/L (ref 39–117)
ALT: 15 IU/L (ref 0–32)
AST: 22 IU/L (ref 0–40)
Albumin/Globulin Ratio: 1.3 (ref 1.1–2.5)
BUN / CREAT RATIO: 34 — AB (ref 11–26)
BUN: 31 mg/dL — ABNORMAL HIGH (ref 8–27)
Bilirubin Total: 0.4 mg/dL (ref 0.0–1.2)
CO2: 27 mmol/L (ref 18–29)
Calcium: 9.2 mg/dL (ref 8.7–10.3)
Chloride: 99 mmol/L (ref 97–108)
Creatinine, Ser: 0.9 mg/dL (ref 0.57–1.00)
GFR calc Af Amer: 66 mL/min/{1.73_m2} (ref >59)
GFR, EST NON AFRICAN AMERICAN: 57 mL/min/{1.73_m2} — AB (ref >59)
GLOBULIN, TOTAL: 3.1 g/dL (ref 1.5–4.5)
GLUCOSE: 107 mg/dL — AB (ref 65–99)
POTASSIUM: 4.9 mmol/L (ref 3.5–5.2)
Sodium: 139 mmol/L (ref 134–144)
Total Protein: 7.1 g/dL (ref 6.0–8.5)

## 2014-05-06 LAB — LIPID PANEL
CHOLESTEROL TOTAL: 143 mg/dL (ref 100–199)
Chol/HDL Ratio: 2 ratio units (ref 0.0–4.4)
HDL: 72 mg/dL (ref >39)
LDL Calculated: 65 mg/dL (ref 0–99)
Triglycerides: 29 mg/dL (ref 0–149)
VLDL Cholesterol Cal: 6 mg/dL (ref 5–40)

## 2014-05-06 LAB — MICROALBUMIN, URINE: MICROALBUM., U, RANDOM: 94.5 ug/mL — AB (ref 0.0–17.0)

## 2014-05-06 LAB — TSH: TSH: 1.04 u[IU]/mL (ref 0.450–4.500)

## 2014-05-06 LAB — HEMOGLOBIN A1C
Est. average glucose Bld gHb Est-mCnc: 131 mg/dL
Hgb A1c MFr Bld: 6.2 % — ABNORMAL HIGH (ref 4.8–5.6)

## 2014-05-10 ENCOUNTER — Ambulatory Visit (INDEPENDENT_AMBULATORY_CARE_PROVIDER_SITE_OTHER): Payer: Medicare Other | Admitting: Internal Medicine

## 2014-05-10 ENCOUNTER — Encounter: Payer: Self-pay | Admitting: Internal Medicine

## 2014-05-10 VITALS — BP 124/76 | HR 70 | Temp 97.4°F | Wt 159.0 lb

## 2014-05-10 DIAGNOSIS — E785 Hyperlipidemia, unspecified: Secondary | ICD-10-CM

## 2014-05-10 DIAGNOSIS — I10 Essential (primary) hypertension: Secondary | ICD-10-CM | POA: Diagnosis not present

## 2014-05-10 DIAGNOSIS — R609 Edema, unspecified: Secondary | ICD-10-CM | POA: Diagnosis not present

## 2014-05-10 DIAGNOSIS — M1990 Unspecified osteoarthritis, unspecified site: Secondary | ICD-10-CM

## 2014-05-10 DIAGNOSIS — M25561 Pain in right knee: Secondary | ICD-10-CM

## 2014-05-10 DIAGNOSIS — E039 Hypothyroidism, unspecified: Secondary | ICD-10-CM

## 2014-05-10 DIAGNOSIS — I798 Other disorders of arteries, arterioles and capillaries in diseases classified elsewhere: Secondary | ICD-10-CM | POA: Diagnosis not present

## 2014-05-10 DIAGNOSIS — R269 Unspecified abnormalities of gait and mobility: Secondary | ICD-10-CM

## 2014-05-10 DIAGNOSIS — I872 Venous insufficiency (chronic) (peripheral): Secondary | ICD-10-CM

## 2014-05-10 DIAGNOSIS — E1151 Type 2 diabetes mellitus with diabetic peripheral angiopathy without gangrene: Secondary | ICD-10-CM

## 2014-05-10 NOTE — Progress Notes (Signed)
Patient ID: Jessica MunchPauline G Radel, female   DOB: [birth year], 81 y.o.   MRN: [id number]    Facility  [facility]    Place of Service:   OFFICE   Allergies  Allergen Reactions  . Cabbage     unknown    Chief Complaint  Patient presents with  . Medical Management of Chronic Issues    4 month follow-up, discuss labs (copy printed)   Daughter present today.  HPI:  Controlled type 2 DM with peripheral circulatory disorder - excellent control  Essential hypertension -controlled  Edema: stable and related to venous insufficiency. She was wearing knee-high compression stockings unable to tolerate them. She found that there was an accumulation of fluid above the knee. She is requesting thigh-high stockings now. Zippers on the knee high stockings were helpful to her.  Abnormality of gait: Unstable related to chronic hip and knee pains  Arthritis: Generalized but affecting mostly the knees at this time  Hyperlipidemia: Controlled  Hypothyroidism, unspecified hypothyroidism type: Compensated  Pain in joint, lower leg, right: Right knee pains. She has started her water aerobics class. Daughter is asking whether she should be getting some physical therapy as well, but patient says she is taking the aerobics 3 days per week and she thinks this is all she can do.  Venous insufficiency (chronic) (peripheral): Continue to optimize the compression stockings and use them regularly.    Medications: Patient's Medications  New Prescriptions   No medications on file  Previous Medications   AMBULATORY NON FORMULARY MEDICATION    Knee High Compression Hose with Zippers for both legs. Diagnosis M25.561, R60.9, I87.2   ASPIRIN 81 MG TABLET    Take 81 mg by mouth daily.   BAYER MICROLET LANCETS LANCETS    Use as instructed to check blood sugar once daily.250.00   CALCIUM CARBONATE PO    Take 1 tablet by mouth daily.   FUROSEMIDE (LASIX) 40 MG TABLET    Take one tablet by mouth once daily for edema   GLUCOSE  BLOOD (BAYER CONTOUR TEST) TEST STRIP    Use as instructed to check blood sugar once daily. 250.00   GLUCOSE BLOOD TEST STRIP    Test blood sugar once daily   LEVOTHYROXINE (SYNTHROID) 25 MCG TABLET    Take one tablet by mouth daily for thyroid supplement.   LOSARTAN (COZAAR) 50 MG TABLET    Take One tablet by mouth once daily to control BP   MULTIVITAMIN-IRON-MINERALS-FOLIC ACID (CENTRUM) CHEWABLE TABLET    Chew 1 tablet by mouth daily.   TOLTERODINE (DETROL LA) 4 MG 24 HR CAPSULE    Take one tablet once daily for bladder control  Modified Medications   No medications on file  Discontinued Medications   PANTOPRAZOLE (PROTONIX) 40 MG TABLET    Take 1 tablet by mouth daily for stomach.     Review of Systems  Constitutional: Negative for fever, chills, activity change, appetite change, fatigue and unexpected weight change.  HENT: Negative.   Eyes: Negative.   Respiratory: Negative.  Negative for cough and shortness of breath.   Cardiovascular: Positive for leg swelling. Negative for chest pain and palpitations.  Gastrointestinal: Positive for constipation. Negative for vomiting, diarrhea and abdominal distention.       Incontinent of stool and urine sometimes  Endocrine: Negative for cold intolerance, heat intolerance, polydipsia, polyphagia and polyuria.  Genitourinary:       Incontinent  Musculoskeletal: Positive for myalgias, back pain, joint swelling, arthralgias and gait problem.  Skin: Negative.   Neurological: Negative.   Hematological: Negative.   Psychiatric/Behavioral: Negative.     Filed Vitals:   05/10/14 1440  BP: 124/76  Pulse: 70  Temp: 97.4 F (36.3 C)  TempSrc: Oral  Weight: 159 lb (72.122 kg)  SpO2: 96%   Body mass index is 29.07 kg/(m^2).  Physical Exam  Constitutional: She is oriented to person, place, and time. She appears well-developed. No distress.  frail  HENT:  Head: Normocephalic and atraumatic.  Left Ear: External ear normal.  Nose: Nose  normal.  Eyes: Conjunctivae and EOM are normal. Pupils are equal, round, and reactive to light.  Neck: Normal range of motion. Neck supple. No JVD present. No tracheal deviation present. No thyromegaly present.  Cardiovascular: Normal rate, regular rhythm, normal heart sounds and intact distal pulses.  Exam reveals no gallop and no friction rub.   No murmur heard. Varicose veins.  Pulmonary/Chest: No respiratory distress. She has no wheezes. She has no rales.  Abdominal: She exhibits no distension and no mass. There is no tenderness.  Musculoskeletal: She exhibits edema and tenderness (both knees).  Poor balance. Using walker.  Lymphadenopathy:    She has no cervical adenopathy.  Neurological: She is alert and oriented to person, place, and time. No cranial nerve deficit. Coordination normal.  Skin: No rash noted. No erythema. No pallor.  Seborrheic keratoses  Psychiatric: She has a normal mood and affect. Her behavior is normal. Thought content normal.     Labs reviewed: Appointment on 05/05/2014  Component Date Value Ref Range Status  . Hgb A1c MFr Bld 05/05/2014 6.2* 4.8 - 5.6 % Final   Comment:          Pre-diabetes: 5.7 - 6.4          Diabetes: >6.4          Glycemic control for adults with diabetes: <7.0   . Est. average glucose Bld gHb Est-m* 05/05/2014 131   Final  . Glucose 05/05/2014 107* 65 - 99 mg/dL Final  . BUN 16/12/9602 31* 8 - 27 mg/dL Final  . Creatinine, Ser 05/05/2014 0.90  0.57 - 1.00 mg/dL Final  . GFR calc non Af Amer 05/05/2014 57* >59 mL/min/1.73 Final  . GFR calc Af Amer 05/05/2014 66  >59 mL/min/1.73 Final  . BUN/Creatinine Ratio 05/05/2014 34* 11 - 26 Final  . Sodium 05/05/2014 139  134 - 144 mmol/L Final  . Potassium 05/05/2014 4.9  3.5 - 5.2 mmol/L Final  . Chloride 05/05/2014 99  97 - 108 mmol/L Final  . CO2 05/05/2014 27  18 - 29 mmol/L Final  . Calcium 05/05/2014 9.2  8.7 - 10.3 mg/dL Final  . Total Protein 05/05/2014 7.1  6.0 - 8.5 g/dL Final    . Albumin 54/11/8117 4.0  3.5 - 4.7 g/dL Final  . Globulin, Total 05/05/2014 3.1  1.5 - 4.5 g/dL Final  . Albumin/Globulin Ratio 05/05/2014 1.3  1.1 - 2.5 Final  . Bilirubin Total 05/05/2014 0.4  0.0 - 1.2 mg/dL Final  . Alkaline Phosphatase 05/05/2014 95  39 - 117 IU/L Final  . AST 05/05/2014 22  0 - 40 IU/L Final  . ALT 05/05/2014 15  0 - 32 IU/L Final  . Cholesterol, Total 05/05/2014 143  100 - 199 mg/dL Final  . Triglycerides 05/05/2014 29  0 - 149 mg/dL Final  . HDL 14/78/2956 72  >39 mg/dL Final   Comment: According to ATP-III Guidelines, HDL-C >59 mg/dL is considered a negative risk  factor for CHD.   Marland Kitchen VLDL Cholesterol Cal 05/05/2014 6  5 - 40 mg/dL Final  . LDL Calculated 05/05/2014 65  0 - 99 mg/dL Final  . Chol/HDL Ratio 05/05/2014 2.0  0.0 - 4.4 ratio units Final   Comment:                                   T. Chol/HDL Ratio                                             Men  Women                               1/2 Avg.Risk  3.4    3.3                                   Avg.Risk  5.0    4.4                                2X Avg.Risk  9.6    7.1                                3X Avg.Risk 23.4   11.0   . Microalbum.,U,Random 05/05/2014 94.5* 0.0 - 17.0 ug/mL Final  . TSH 05/05/2014 1.040  0.450 - 4.500 uIU/mL Final     Assessment/Plan 1. Controlled type 2 DM with peripheral circulatory disorder Continue current medications - Hemoglobin A1c; Future - Basic metabolic panel; Future  2. Essential hypertension Continue current medications - Basic metabolic panel; Future  3. Edema Prescriptions were written for thigh-high stockings both with and without zippers at about 20-30 mm compression and open toe.  4. Abnormality of gait Continue use walker  5. Arthritis Continue exercises. Patient has scheduled visits in [place] at the [facility]. Although I'm not familiar with this clinic, it seems to offer alternatives to surgery for chronic knee pains. It was recommended  by her son.   6. Hyperlipidemia Controlled  7. Hypothyroidism, unspecified hypothyroidism type Compensated  8. Pain in joint, lower leg, right Continue exercises at [facility]. Continue water exercises.  9. Venous insufficiency (chronic) (peripheral) Continue compression stockings. She will try the thigh-high stockings. She has arranged appointment at the [facility] vein and laser treatment center, to see if there is anything additional they would recommend for her chronically swollen legs due to venous insufficiency

## 2014-05-10 NOTE — Patient Instructions (Signed)
Go ahead with planned visits to clinics in [place].

## 2014-05-13 DIAGNOSIS — R269 Unspecified abnormalities of gait and mobility: Secondary | ICD-10-CM | POA: Diagnosis not present

## 2014-05-13 DIAGNOSIS — M79669 Pain in unspecified lower leg: Secondary | ICD-10-CM | POA: Diagnosis not present

## 2014-05-13 DIAGNOSIS — M25562 Pain in left knee: Secondary | ICD-10-CM | POA: Diagnosis not present

## 2014-05-13 DIAGNOSIS — M25561 Pain in right knee: Secondary | ICD-10-CM | POA: Diagnosis not present

## 2014-05-13 DIAGNOSIS — M170 Bilateral primary osteoarthritis of knee: Secondary | ICD-10-CM | POA: Diagnosis not present

## 2014-05-24 DIAGNOSIS — M25562 Pain in left knee: Secondary | ICD-10-CM | POA: Diagnosis not present

## 2014-05-24 DIAGNOSIS — M170 Bilateral primary osteoarthritis of knee: Secondary | ICD-10-CM | POA: Diagnosis not present

## 2014-05-24 DIAGNOSIS — R269 Unspecified abnormalities of gait and mobility: Secondary | ICD-10-CM | POA: Diagnosis not present

## 2014-05-24 DIAGNOSIS — M25561 Pain in right knee: Secondary | ICD-10-CM | POA: Diagnosis not present

## 2014-05-31 DIAGNOSIS — M25562 Pain in left knee: Secondary | ICD-10-CM | POA: Diagnosis not present

## 2014-05-31 DIAGNOSIS — R269 Unspecified abnormalities of gait and mobility: Secondary | ICD-10-CM | POA: Diagnosis not present

## 2014-05-31 DIAGNOSIS — M25561 Pain in right knee: Secondary | ICD-10-CM | POA: Diagnosis not present

## 2014-05-31 DIAGNOSIS — M170 Bilateral primary osteoarthritis of knee: Secondary | ICD-10-CM | POA: Diagnosis not present

## 2014-06-01 ENCOUNTER — Ambulatory Visit: Payer: Medicare Other | Admitting: Podiatry

## 2014-06-07 DIAGNOSIS — M25561 Pain in right knee: Secondary | ICD-10-CM | POA: Diagnosis not present

## 2014-06-07 DIAGNOSIS — R269 Unspecified abnormalities of gait and mobility: Secondary | ICD-10-CM | POA: Diagnosis not present

## 2014-06-07 DIAGNOSIS — M25562 Pain in left knee: Secondary | ICD-10-CM | POA: Diagnosis not present

## 2014-06-07 DIAGNOSIS — M170 Bilateral primary osteoarthritis of knee: Secondary | ICD-10-CM | POA: Diagnosis not present

## 2014-06-16 DIAGNOSIS — M25562 Pain in left knee: Secondary | ICD-10-CM | POA: Diagnosis not present

## 2014-06-16 DIAGNOSIS — R269 Unspecified abnormalities of gait and mobility: Secondary | ICD-10-CM | POA: Diagnosis not present

## 2014-06-16 DIAGNOSIS — M170 Bilateral primary osteoarthritis of knee: Secondary | ICD-10-CM | POA: Diagnosis not present

## 2014-06-16 DIAGNOSIS — M25561 Pain in right knee: Secondary | ICD-10-CM | POA: Diagnosis not present

## 2014-06-27 ENCOUNTER — Telehealth: Payer: Self-pay | Admitting: *Deleted

## 2014-06-27 NOTE — Telephone Encounter (Signed)
Jessica Hawkins, Daughter called and stated that [facility] [place] and they suggested patient to take an anti-inflammatory. They suggested Ibuprofen 600mg  up to three times daily. Daughter wanted to make sure this is ok or if there are any other suggestions. Please Advise.

## 2014-06-28 NOTE — Telephone Encounter (Signed)
It is okay to use ibuprofen as directed.

## 2014-06-28 NOTE — Telephone Encounter (Signed)
Crist FatFern, daughter Notified and agreed.

## 2014-07-29 ENCOUNTER — Ambulatory Visit (INDEPENDENT_AMBULATORY_CARE_PROVIDER_SITE_OTHER): Payer: Medicare Other | Admitting: Internal Medicine

## 2014-07-29 ENCOUNTER — Encounter: Payer: Self-pay | Admitting: Internal Medicine

## 2014-07-29 VITALS — BP 124/82 | HR 69 | Temp 97.2°F | Resp 20 | Ht 62.0 in | Wt 151.2 lb

## 2014-07-29 DIAGNOSIS — E039 Hypothyroidism, unspecified: Secondary | ICD-10-CM

## 2014-07-29 DIAGNOSIS — I10 Essential (primary) hypertension: Secondary | ICD-10-CM | POA: Diagnosis not present

## 2014-07-29 DIAGNOSIS — I798 Other disorders of arteries, arterioles and capillaries in diseases classified elsewhere: Secondary | ICD-10-CM

## 2014-07-29 DIAGNOSIS — H9193 Unspecified hearing loss, bilateral: Secondary | ICD-10-CM

## 2014-07-29 DIAGNOSIS — E1151 Type 2 diabetes mellitus with diabetic peripheral angiopathy without gangrene: Secondary | ICD-10-CM

## 2014-07-29 NOTE — Progress Notes (Signed)
Patient ID: Gerhard Munch, female   DOB: [birth year], 81 y.o.   MRN: [id number]    Location:    [facility]   Place of Service:  OFFICE    Chief Complaint  Patient presents with  . Acute Visit    Patient c/o Decreased hearing    HPI:  81 yo female seen today for an acute visit. Family noted reduced hearing. Pt wears hearing aids. Last checked 2 mos ago. No tinnitus. No ear pressure or pain. Denies f/c, HA or dizziness.   BP stable with lasix and losartan  Diabetes is diet controlled  Taking levothyroxine for thyroid d/o  Past Medical History  Diagnosis Date  . Hypertension   . Osteoporosis   . GERD (gastroesophageal reflux disease) 06/29/2011  . Hypothyroid 06/29/2011  . HTN (hypertension) 06/29/2011  . Platelet disorder     ? Von Willebrand  . Hyperlipidemia   . Diabetes mellitus without complication   . Edema   . BBB (bundle branch block)     RT  . Hiatal hernia   . Fibrocystic breast   . Arthritis     Osteoarthritis  . Vertigo   . Insomnia   . Gait disorder   . Unspecified constipation   . Disturbance of skin sensation   . Unspecified vitamin D deficiency   . Unspecified urinary incontinence   . Unspecified sleep apnea   . Coronary atherosclerosis of unspecified type of vessel, native or graft   . Diaphragmatic hernia without mention of obstruction or gangrene   . Debility, unspecified     Past Surgical History  Procedure Laterality Date  . Hip surgery Right 2002    ORIF  . Thyroidectomy  1952  . Tonsillectomy    . Femur im nail  06/29/2011    Procedure: INTRAMEDULLARY (IM) NAIL FEMORAL;  Surgeon: Verlee Rossetti, MD;  Location: [facility] OR;  Service: Orthopedics;  Laterality: Left;  . Rotator cuff repair Left 1999    Tear  . Abdominal hysterectomy    . Eye surgery Right 2005    Cataract surgery  . Eye surgery Left 2006    Cataract surgery    Patient Care Team: Kimber Relic, MD as PCP - General (Internal Medicine) Vida Rigger, MD as Consulting Physician  (Gastroenterology) Marcine Matar, MD as Consulting Physician (Urology) Dominica Severin, MD as Consulting Physician (Orthopedic Surgery) Janalyn Harder, MD as Consulting Physician (Dermatology)  History   Social History  . Marital Status: Widowed    Spouse Name: N/A  . Number of Children: N/A  . Years of Education: N/A   Occupational History  . Not on file.   Social History Main Topics  . Smoking status: Never Smoker   . Smokeless tobacco: Never Used  . Alcohol Use: No  . Drug Use: No  . Sexual Activity: No   Other Topics Concern  . Not on file   Social History Narrative   Kelse Ploch [phone] Rexene Edison                      [phone] C     reports that she has never smoked. She has never used smokeless tobacco. She reports that she does not drink alcohol or use illicit drugs.  Allergies  Allergen Reactions  . Cabbage     unknown    Medications: Patient's Medications  New Prescriptions   No medications on file  Previous Medications   AMBULATORY NON FORMULARY MEDICATION    Knee High  Compression Hose with Zippers for both legs. Diagnosis M25.561, R60.9, I87.2   ASPIRIN 81 MG TABLET    Take 81 mg by mouth daily.   BAYER MICROLET LANCETS LANCETS    Use as instructed to check blood sugar once daily.250.00   CALCIUM CARBONATE PO    Take 1 tablet by mouth daily.   FUROSEMIDE (LASIX) 40 MG TABLET    Take one tablet by mouth once daily for edema   GLUCOSE BLOOD (BAYER CONTOUR TEST) TEST STRIP    Use as instructed to check blood sugar once daily. 250.00   GLUCOSE BLOOD TEST STRIP    Test blood sugar once daily   LEVOTHYROXINE (SYNTHROID) 25 MCG TABLET    Take one tablet by mouth daily for thyroid supplement.   LOSARTAN (COZAAR) 50 MG TABLET    Take One tablet by mouth once daily to control BP   MULTIVITAMIN-IRON-MINERALS-FOLIC ACID (CENTRUM) CHEWABLE TABLET    Chew 1 tablet by mouth daily.   TOLTERODINE (DETROL LA) 4 MG 24 HR CAPSULE    Take one tablet once daily for  bladder control  Modified Medications   No medications on file  Discontinued Medications   No medications on file    Review of Systems  Constitutional: Negative for fever, chills, diaphoresis, activity change, appetite change and fatigue.  HENT: Positive for hearing loss. Negative for ear pain and sore throat.   Eyes: Negative for visual disturbance.  Respiratory: Negative for cough, chest tightness and shortness of breath.   Cardiovascular: Negative for chest pain, palpitations and leg swelling.  Gastrointestinal: Negative for nausea, vomiting, abdominal pain, diarrhea, constipation and blood in stool.  Genitourinary: Negative for dysuria.  Musculoskeletal: Negative for arthralgias.  Neurological: Negative for dizziness, tremors, numbness and headaches.  Psychiatric/Behavioral: Negative for sleep disturbance. The patient is not nervous/anxious.     Filed Vitals:   07/29/14 1106  BP: 124/82  Pulse: 69  Temp: 97.2 F (36.2 C)  TempSrc: Oral  Resp: 20  Height: 5\' 2"  (1.575 m)  Weight: 151 lb 3.2 oz (68.584 kg)  SpO2: 93%   Body mass index is 27.65 kg/(m^2).  Physical Exam  Constitutional: She appears well-developed and well-nourished. She appears distressed.  HENT:  Head: Normocephalic and atraumatic.  Mouth/Throat: Oropharynx is clear and moist.  R>L TM dull but intact. No redness. No external ear canal swelling/discharge. No cerumen impaction. Oropharynx is clear without exudate. No mucosal lesions  Eyes: Pupils are equal, round, and reactive to light. Scleral icterus is present.  Neck: Neck supple. No tracheal deviation present. No thyromegaly present.  Cardiovascular: Normal rate, regular rhythm and intact distal pulses.  Exam reveals no gallop and no friction rub.   Murmur (1/6 SEM) heard. Pulmonary/Chest: Effort normal and breath sounds normal. No stridor. No respiratory distress. She has no wheezes. She has no rales.  Lymphadenopathy:       Head (right side): No  submandibular, no preauricular and no posterior auricular adenopathy present.       Head (left side): No submandibular, no preauricular and no posterior auricular adenopathy present.    She has no cervical adenopathy.       Right: No supraclavicular adenopathy present.       Left: No supraclavicular adenopathy present.  Skin: Skin is warm and dry. No rash noted.  Psychiatric: She has a normal mood and affect. Her behavior is normal. Thought content normal.     Labs reviewed: Appointment on 05/05/2014  Component Date Value Ref Range Status  .  Hgb A1c MFr Bld 05/05/2014 6.2* 4.8 - 5.6 % Final   Comment:          Pre-diabetes: 5.7 - 6.4          Diabetes: >6.4          Glycemic control for adults with diabetes: <7.0   . Est. average glucose Bld gHb Est-m* 05/05/2014 131   Final  . Glucose 05/05/2014 107* 65 - 99 mg/dL Final  . BUN 40/98/1191 31* 8 - 27 mg/dL Final  . Creatinine, Ser 05/05/2014 0.90  0.57 - 1.00 mg/dL Final  . GFR calc non Af Amer 05/05/2014 57* >59 mL/min/1.73 Final  . GFR calc Af Amer 05/05/2014 66  >59 mL/min/1.73 Final  . BUN/Creatinine Ratio 05/05/2014 34* 11 - 26 Final  . Sodium 05/05/2014 139  134 - 144 mmol/L Final  . Potassium 05/05/2014 4.9  3.5 - 5.2 mmol/L Final  . Chloride 05/05/2014 99  97 - 108 mmol/L Final  . CO2 05/05/2014 27  18 - 29 mmol/L Final  . Calcium 05/05/2014 9.2  8.7 - 10.3 mg/dL Final  . Total Protein 05/05/2014 7.1  6.0 - 8.5 g/dL Final  . Albumin 47/82/9562 4.0  3.5 - 4.7 g/dL Final  . Globulin, Total 05/05/2014 3.1  1.5 - 4.5 g/dL Final  . Albumin/Globulin Ratio 05/05/2014 1.3  1.1 - 2.5 Final  . Bilirubin Total 05/05/2014 0.4  0.0 - 1.2 mg/dL Final  . Alkaline Phosphatase 05/05/2014 95  39 - 117 IU/L Final  . AST 05/05/2014 22  0 - 40 IU/L Final  . ALT 05/05/2014 15  0 - 32 IU/L Final  . Cholesterol, Total 05/05/2014 143  100 - 199 mg/dL Final  . Triglycerides 05/05/2014 29  0 - 149 mg/dL Final  . HDL 13/10/6576 72  >39 mg/dL Final    Comment: According to ATP-III Guidelines, HDL-C >59 mg/dL is considered a negative risk factor for CHD.   Marland Kitchen VLDL Cholesterol Cal 05/05/2014 6  5 - 40 mg/dL Final  . LDL Calculated 05/05/2014 65  0 - 99 mg/dL Final  . Chol/HDL Ratio 05/05/2014 2.0  0.0 - 4.4 ratio units Final   Comment:                                   T. Chol/HDL Ratio                                             Men  Women                               1/2 Avg.Risk  3.4    3.3                                   Avg.Risk  5.0    4.4                                2X Avg.Risk  9.6    7.1  3X Avg.Risk 23.4   11.0   . Microalbum.,U,Random 05/05/2014 94.5* 0.0 - 17.0 ug/mL Final  . TSH 05/05/2014 1.040  0.450 - 4.500 uIU/mL Final    No results found.   Assessment/Plan    ICD-9-CM ICD-10-CM   1. Decreased hearing, bilateral 389.9 H91.93   2. Essential hypertension - stable; cont meds 401.9 I10   3. Controlled type 2 DM with peripheral circulatory disorder - stable; cont diet 250.70 E11.51    443.81 I79.8   4. Hypothyroidism, unspecified hypothyroidism type - stable; cont med 244.9 E03.9    --reassurance given. No cerumen impaction today  --Keep hearing aids clean and follow up with audiologist on a regular basis for maintenance  --continue current medications as ordered  --Keep appt with Dr Chilton Si as scheduled  Bertis Ruddy. Ancil Linsey  [facility] [address] [place], [place] [address] [phone] Cell (Monday-Friday 8 AM - 5 PM) [phone] After 5 PM and follow prompts

## 2014-07-29 NOTE — Patient Instructions (Signed)
No ear wax build up seen today. No ear lavage/cleaning needed  Keep hearing aids clean and follow up with audiologist on a regular basis for maintenance  Keep appt with Dr Chilton SiGreen as scheduled

## 2014-08-05 ENCOUNTER — Telehealth: Payer: Self-pay | Admitting: *Deleted

## 2014-08-05 NOTE — Telephone Encounter (Signed)
This would require a visit and examination to be fully accurate with recommendations. In general, most nosebleeds are anterior nosebleeds. Laying down with her head slightly propped up and pinching the nose while applying an ice pack across the upper lip can be helpful in stopping the bleeding.  Most of these nosebleeds involve a traumatized area along the nasal septum. Daily application of an ointment like Triple Antibiotic ointment may protect this area.

## 2014-08-05 NOTE — Telephone Encounter (Signed)
Patient daughter stated that patient had a bad nose bleed 2 days ago. Had heavy bleeding. Took about 30 minutes to get it stopped. She stated that the first one she had she had a cold and picked at her nose. This time it just started bleeding. Was concerned and wanted to know what to do to stop them and wanted to know what is causing them. Patient has an appointment at the end of this month. Please Advise.

## 2014-08-08 NOTE — Telephone Encounter (Signed)
Jessica Hawkins, daughter understood the directions and repeated back to me Dr. Thomasene LotGreen's instructions. She will call back if anymore nosebleeds between now and appointment time and schedule an appointment.

## 2014-08-12 DIAGNOSIS — M7989 Other specified soft tissue disorders: Secondary | ICD-10-CM | POA: Diagnosis not present

## 2014-08-12 DIAGNOSIS — I872 Venous insufficiency (chronic) (peripheral): Secondary | ICD-10-CM | POA: Diagnosis not present

## 2014-08-12 DIAGNOSIS — M174 Other bilateral secondary osteoarthritis of knee: Secondary | ICD-10-CM | POA: Diagnosis not present

## 2014-08-26 ENCOUNTER — Other Ambulatory Visit: Payer: Medicare Other

## 2014-08-26 DIAGNOSIS — I10 Essential (primary) hypertension: Secondary | ICD-10-CM | POA: Diagnosis not present

## 2014-08-26 DIAGNOSIS — E1151 Type 2 diabetes mellitus with diabetic peripheral angiopathy without gangrene: Secondary | ICD-10-CM | POA: Diagnosis not present

## 2014-08-26 DIAGNOSIS — I798 Other disorders of arteries, arterioles and capillaries in diseases classified elsewhere: Secondary | ICD-10-CM | POA: Diagnosis not present

## 2014-08-27 LAB — BASIC METABOLIC PANEL
BUN / CREAT RATIO: 28 — AB (ref 11–26)
BUN: 32 mg/dL — ABNORMAL HIGH (ref 8–27)
CALCIUM: 9.6 mg/dL (ref 8.7–10.3)
CO2: 28 mmol/L (ref 18–29)
CREATININE: 1.16 mg/dL — AB (ref 0.57–1.00)
Chloride: 100 mmol/L (ref 97–108)
GFR calc Af Amer: 48 mL/min/{1.73_m2} — ABNORMAL LOW (ref >59)
GFR, EST NON AFRICAN AMERICAN: 42 mL/min/{1.73_m2} — AB (ref >59)
Glucose: 109 mg/dL — ABNORMAL HIGH (ref 65–99)
Potassium: 4.2 mmol/L (ref 3.5–5.2)
Sodium: 141 mmol/L (ref 134–144)

## 2014-08-27 LAB — DRAWING FEE

## 2014-08-27 LAB — HEMOGLOBIN A1C
ESTIMATED AVERAGE GLUCOSE: 140 mg/dL
Hgb A1c MFr Bld: 6.5 % — ABNORMAL HIGH (ref 4.8–5.6)

## 2014-08-29 ENCOUNTER — Other Ambulatory Visit: Payer: Medicare Other

## 2014-08-29 DIAGNOSIS — Z5189 Encounter for other specified aftercare: Secondary | ICD-10-CM | POA: Diagnosis not present

## 2014-08-29 DIAGNOSIS — M7989 Other specified soft tissue disorders: Secondary | ICD-10-CM | POA: Diagnosis not present

## 2014-08-31 ENCOUNTER — Ambulatory Visit (INDEPENDENT_AMBULATORY_CARE_PROVIDER_SITE_OTHER): Payer: Medicare Other | Admitting: Internal Medicine

## 2014-08-31 ENCOUNTER — Encounter: Payer: Self-pay | Admitting: Internal Medicine

## 2014-08-31 VITALS — BP 108/68 | HR 84 | Temp 97.8°F | Resp 20 | Ht 62.0 in | Wt 144.6 lb

## 2014-08-31 DIAGNOSIS — M1990 Unspecified osteoarthritis, unspecified site: Secondary | ICD-10-CM

## 2014-08-31 DIAGNOSIS — I872 Venous insufficiency (chronic) (peripheral): Secondary | ICD-10-CM

## 2014-08-31 DIAGNOSIS — E785 Hyperlipidemia, unspecified: Secondary | ICD-10-CM

## 2014-08-31 DIAGNOSIS — I798 Other disorders of arteries, arterioles and capillaries in diseases classified elsewhere: Secondary | ICD-10-CM

## 2014-08-31 DIAGNOSIS — E039 Hypothyroidism, unspecified: Secondary | ICD-10-CM | POA: Diagnosis not present

## 2014-08-31 DIAGNOSIS — R609 Edema, unspecified: Secondary | ICD-10-CM

## 2014-08-31 DIAGNOSIS — I10 Essential (primary) hypertension: Secondary | ICD-10-CM | POA: Diagnosis not present

## 2014-08-31 DIAGNOSIS — E1151 Type 2 diabetes mellitus with diabetic peripheral angiopathy without gangrene: Secondary | ICD-10-CM | POA: Diagnosis not present

## 2014-08-31 NOTE — Progress Notes (Signed)
Patient ID: Jessica Hawkins, female   DOB: November 17, 1922, 81 y.o.   MRN: [id number]    Facility  [facility]    Place of Service:   OFFICE    Allergies  Allergen Reactions  . Cabbage     unknown    Chief Complaint  Patient presents with  . Medical Management of Chronic Issues    3 month follow-up    HPI:  Patient's main issue today is of continued problems with lymphedema and with knee pains.  Patient would like a referral to the lymphedema clinic at [facility] in [place]. She was told that this might be as much as a one-year weight  The family has a CNA to continue wrapping techniques with Ace bandage.  She has been to [facility] for evaluation of her knees. She saw a Dr Jarold Motto. This company now has an office in [place] on [address].  She continues to go to her water exercise therapy.  Medications: Patient's Medications  New Prescriptions   No medications on file  Previous Medications   AMBULATORY NON FORMULARY MEDICATION    Knee High Compression Hose with Zippers for both legs. Diagnosis M25.561, R60.9, I87.2   ASPIRIN 81 MG TABLET    Take 81 mg by mouth daily.   BAYER MICROLET LANCETS LANCETS    Use as instructed to check blood sugar once daily.250.00   CALCIUM CARBONATE PO    Take 1 tablet by mouth daily.   FUROSEMIDE (LASIX) 40 MG TABLET    Take one tablet by mouth once daily for edema   GLUCOSE BLOOD (BAYER CONTOUR TEST) TEST STRIP    Use as instructed to check blood sugar once daily. 250.00   GLUCOSE BLOOD TEST STRIP    Test blood sugar once daily   LEVOTHYROXINE (SYNTHROID) 25 MCG TABLET    Take one tablet by mouth daily for thyroid supplement.   LOSARTAN (COZAAR) 50 MG TABLET    Take One tablet by mouth once daily to control BP   MULTIVITAMIN-IRON-MINERALS-FOLIC ACID (CENTRUM) CHEWABLE TABLET    Chew 1 tablet by mouth daily.   TOLTERODINE (DETROL LA) 4 MG 24 HR CAPSULE    Take one tablet once daily for bladder control  Modified Medications   No medications on  file  Discontinued Medications   No medications on file     Review of Systems  Constitutional: Negative for fever, chills, activity change, appetite change, fatigue and unexpected weight change.  HENT: Negative.   Eyes: Negative.   Respiratory: Negative.  Negative for cough and shortness of breath.   Cardiovascular: Positive for leg swelling. Negative for chest pain and palpitations.  Gastrointestinal: Positive for constipation. Negative for vomiting, diarrhea and abdominal distention.       Incontinent of stool and urine sometimes  Endocrine: Negative for cold intolerance, heat intolerance, polydipsia, polyphagia and polyuria.  Genitourinary:       Incontinent  Musculoskeletal: Positive for myalgias, back pain, joint swelling, arthralgias and gait problem.  Skin: Negative.   Neurological: Negative.   Hematological: Negative.   Psychiatric/Behavioral: Negative.     Filed Vitals:   08/31/14 1344  BP: 108/68  Pulse: 84  Temp: 97.8 F (36.6 C)  TempSrc: Oral  Resp: 20  Height:  (1.575 m)  Weight: 144 lb 9.6 oz (65.59 kg)  SpO2: 94%   Body mass index is 26.44 kg/(m^2).  Physical Exam  Constitutional: She is oriented to person, place, and time. She appears well-developed. No distress.  frail  HENT:  Head: Normocephalic and atraumatic.  Left Ear: External ear normal.  Nose: Nose normal.  Eyes: Conjunctivae and EOM are normal. Pupils are equal, round, and reactive to light.  Neck: Normal range of motion. Neck supple. No JVD present. No tracheal deviation present. No thyromegaly present.  Cardiovascular: Normal rate, regular rhythm, normal heart sounds and intact distal pulses.  Exam reveals no gallop and no friction rub.   No murmur heard. Varicose veins.  Pulmonary/Chest: No respiratory distress. She has no wheezes. She has no rales.  Abdominal: She exhibits no distension and no mass. There is no tenderness.  Musculoskeletal: She exhibits edema and tenderness (both  knees).  Poor balance. Using walker.  Lymphadenopathy:    She has no cervical adenopathy.  Neurological: She is alert and oriented to person, place, and time. No cranial nerve deficit. Coordination normal.  Skin: No rash noted. No erythema. No pallor.  Seborrheic keratoses  Psychiatric: She has a normal mood and affect. Her behavior is normal. Thought content normal.     Labs reviewed: No visits with results within 3 Month(s) from this visit. Latest known visit with results is:  Appointment on 05/05/2014  Component Date Value Ref Range Status  . Hgb A1c MFr Bld 05/05/2014 6.2* 4.8 - 5.6 % Final   Comment:          Pre-diabetes: 5.7 - 6.4          Diabetes: >6.4          Glycemic control for adults with diabetes: <7.0   . Est. average glucose Bld gHb Est-m* 05/05/2014 131   Final  . Glucose 05/05/2014 107* 65 - 99 mg/dL Final  . BUN 16/12/9602 31* 8 - 27 mg/dL Final  . Creatinine, Ser 05/05/2014 0.90  0.57 - 1.00 mg/dL Final  . GFR calc non Af Amer 05/05/2014 57* >59 mL/min/1.73 Final  . GFR calc Af Amer 05/05/2014 66  >59 mL/min/1.73 Final  . BUN/Creatinine Ratio 05/05/2014 34* 11 - 26 Final  . Sodium 05/05/2014 139  134 - 144 mmol/L Final  . Potassium 05/05/2014 4.9  3.5 - 5.2 mmol/L Final  . Chloride 05/05/2014 99  97 - 108 mmol/L Final  . CO2 05/05/2014 27  18 - 29 mmol/L Final  . Calcium 05/05/2014 9.2  8.7 - 10.3 mg/dL Final  . Total Protein 05/05/2014 7.1  6.0 - 8.5 g/dL Final  . Albumin 54/11/8117 4.0  3.5 - 4.7 g/dL Final  . Globulin, Total 05/05/2014 3.1  1.5 - 4.5 g/dL Final  . Albumin/Globulin Ratio 05/05/2014 1.3  1.1 - 2.5 Final  . Bilirubin Total 05/05/2014 0.4  0.0 - 1.2 mg/dL Final  . Alkaline Phosphatase 05/05/2014 95  39 - 117 IU/L Final  . AST 05/05/2014 22  0 - 40 IU/L Final  . ALT 05/05/2014 15  0 - 32 IU/L Final  . Cholesterol, Total 05/05/2014 143  100 - 199 mg/dL Final  . Triglycerides 05/05/2014 29  0 - 149 mg/dL Final  . HDL 14/78/2956 72  >39  mg/dL Final   Comment: According to ATP-III Guidelines, HDL-C >59 mg/dL is considered a negative risk factor for CHD.   Marland Kitchen VLDL Cholesterol Cal 05/05/2014 6  5 - 40 mg/dL Final  . LDL Calculated 05/05/2014 65  0 - 99 mg/dL Final  . Chol/HDL Ratio 05/05/2014 2.0  0.0 - 4.4 ratio units Final   Comment:  T. Chol/HDL Ratio                                             Men  Women                               1/2 Avg.Risk  3.4    3.3                                   Avg.Risk  5.0    4.4                                2X Avg.Risk  9.6    7.1                                3X Avg.Risk 23.4   11.0   . Microalbum.,U,Random 05/05/2014 94.5* 0.0 - 17.0 ug/mL Final  . TSH 05/05/2014 1.040  0.450 - 4.500 uIU/mL Final     Assessment/Plan  1. Arthritis Patient would like to have knee surgery. Physicians at [facility] have W believe she would be a candidate if she can get her edema under better control.  2. Controlled type 2 DM with peripheral circulatory disorder - Hemoglobin A1c; Future - Comprehensive metabolic panel; Future  3. Essential hypertension Controlled  4. Hyperlipidemia Controlled  5. Edema See if we can get her enrolled in the lymphedema clinic  6. Hypothyroidism, unspecified hypothyroidism type - TSH; Future  7. Venous insufficiency (chronic) (peripheral) Causes most of the chronic venous insufficiency.

## 2014-09-22 ENCOUNTER — Telehealth: Payer: Self-pay

## 2014-09-22 NOTE — Telephone Encounter (Signed)
Patient's daughter Jessica Hawkins called stating she has left several message with no call back. Jessica Hawkins was calling to get a status update from Dr.Green about conversation he was to have with Careers adviser.  Jessica Hawkins would also like to request order for referral to [facility] for wrapping of ankles only.   Dr.Green please advise  (please complete pending referral)

## 2014-09-23 NOTE — Telephone Encounter (Signed)
09/23/2014. I discussed the issues with Lady Gary [phone]. I have been in contact with Dr. Dorthula Nettles who she saw over a year ago. He would like to see Zollie Beckers again. If it seems appropriate to refer her to the lymphedema clinic sponsored by [facility] orthopedics, he will make the referral. The long-term view is whether or not she can have surgery on her knee to alleviate the discomfort that is now making her ability to walk much more difficult. If Dr. Dion Saucier feels that surgery is a reasonable pathway to follow, I will need to make a referral to cardiologist for presurgical clearance. Friend will be calling the office of Dr. Dion Saucier for an appointment, but she would like for [place] also to push through on the usual referral channels to be sure that Dr. Dion Saucier recognizes that I'm in favor of getting her seen again.

## 2014-09-26 DIAGNOSIS — M170 Bilateral primary osteoarthritis of knee: Secondary | ICD-10-CM | POA: Diagnosis not present

## 2014-09-26 NOTE — Telephone Encounter (Signed)
The order for outpatient therapy should be discontinued. Patient should be getting an appointment with Dr. Dion Saucier. If there is a problem with this, I should know about it, so that I can proceed with the order for outpatient therapy. Dr. Dion Saucier may be able to get this patient into the lymphedema clinic sponsored by [facility] orthopedics sooner.

## 2014-09-26 NOTE — Telephone Encounter (Signed)
Order for referral is still pending, please complete questions if referral is to still take place otherwise we can delete order.

## 2014-09-28 NOTE — Telephone Encounter (Signed)
Order removed

## 2014-11-03 ENCOUNTER — Ambulatory Visit (INDEPENDENT_AMBULATORY_CARE_PROVIDER_SITE_OTHER): Payer: Medicare Other | Admitting: Nurse Practitioner

## 2014-11-03 ENCOUNTER — Encounter: Payer: Self-pay | Admitting: Nurse Practitioner

## 2014-11-03 VITALS — BP 132/62 | HR 74 | Temp 98.1°F | Wt 141.0 lb

## 2014-11-03 DIAGNOSIS — H6123 Impacted cerumen, bilateral: Secondary | ICD-10-CM | POA: Diagnosis not present

## 2014-11-03 DIAGNOSIS — M170 Bilateral primary osteoarthritis of knee: Secondary | ICD-10-CM | POA: Diagnosis not present

## 2014-11-03 NOTE — Progress Notes (Signed)
Patient ID: Jessica Hawkins, female   DOB: [birth year], 81 y.o.   MRN: [id number]    PCP: Kimber Relic, MD  Allergies  Allergen Reactions  . Cabbage     unknown    Chief Complaint  Patient presents with  . Hearing Loss    wax in hearing aids     HPI: Patient is a 81 y.o. female seen in the office today due to more difficulty hearing. Has hearing aids and they appear to be working normally  Increased wax build up in hearing aids, would like ears looked at. Pt has follow up with audiologist coming up and they do not flush her ears to get wax out and she has had a hx of this building up.  Also has her birthday party this weekend and wants to be able to hear. Daughter with questions regarding weight loss, water intake and MVI  Advanced Directive information Does patient have an advance directive?: Yes, Type of Advance Directive: Advance instruction for mental health treatment Review of Systems:  Review of Systems  Constitutional: Positive for unexpected weight change (weight loss). Negative for activity change and appetite change.  HENT: Positive for hearing loss.   Respiratory: Negative for shortness of breath.   Cardiovascular: Negative for leg swelling.  Gastrointestinal: Negative for diarrhea, constipation and abdominal distention.    Past Medical History  Diagnosis Date  . Hypertension   . Osteoporosis   . GERD (gastroesophageal reflux disease) 06/29/2011  . Hypothyroid 06/29/2011  . HTN (hypertension) 06/29/2011  . Platelet disorder     ? Von Willebrand  . Hyperlipidemia   . Diabetes mellitus without complication   . Edema   . BBB (bundle branch block)     RT  . Hiatal hernia   . Fibrocystic breast   . Arthritis     Osteoarthritis  . Vertigo   . Insomnia   . Gait disorder   . Unspecified constipation   . Disturbance of skin sensation   . Unspecified vitamin D deficiency   . Unspecified urinary incontinence   . Unspecified sleep apnea   . Coronary  atherosclerosis of unspecified type of vessel, native or graft   . Diaphragmatic hernia without mention of obstruction or gangrene   . Debility, unspecified    Past Surgical History  Procedure Laterality Date  . Hip surgery Right 2002    ORIF  . Thyroidectomy  1952  . Tonsillectomy    . Femur im nail  06/29/2011    Procedure: INTRAMEDULLARY (IM) NAIL FEMORAL;  Surgeon: Verlee Rossetti, MD;  Location: [facility] OR;  Service: Orthopedics;  Laterality: Left;  . Rotator cuff repair Left 1999    Tear  . Abdominal hysterectomy    . Eye surgery Right 2005    Cataract surgery  . Eye surgery Left 2006    Cataract surgery   Social History:   reports that she has never smoked. She has never used smokeless tobacco. She reports that she does not drink alcohol or use illicit drugs.  Family History  Problem Relation Age of Onset  . Cancer Mother     Stomach  . Cancer Father     Prostate  . Emphysema Father   . Alcohol abuse Sister   . Liver disease Sister   . Kidney disease Brother   . Hypertension Daughter   . Cancer Brother     Prostate, Bladder  . Emphysema Brother   . Cancer Brother     Prostate  .  Arthritis Daughter     Knees  . Heart murmur Son   . Mental illness Son     Autism    Medications: Patient's Medications  New Prescriptions   No medications on file  Previous Medications   AMBULATORY NON FORMULARY MEDICATION    Knee High Compression Hose with Zippers for both legs. Diagnosis M25.561, R60.9, I87.2   ASPIRIN 81 MG TABLET    Take 81 mg by mouth daily.   BAYER MICROLET LANCETS LANCETS    Use as instructed to check blood sugar once daily.250.00   CALCIUM CARBONATE PO    Take 1 tablet by mouth daily.   FUROSEMIDE (LASIX) 40 MG TABLET    Take one tablet by mouth once daily for edema   GLUCOSE BLOOD (BAYER CONTOUR TEST) TEST STRIP    Use as instructed to check blood sugar once daily. 250.00   GLUCOSE BLOOD TEST STRIP    Test blood sugar once daily   LEVOTHYROXINE  (SYNTHROID) 25 MCG TABLET    Take one tablet by mouth daily for thyroid supplement.   LOSARTAN (COZAAR) 50 MG TABLET    Take One tablet by mouth once daily to control BP   MULTIVITAMIN-IRON-MINERALS-FOLIC ACID (CENTRUM) CHEWABLE TABLET    Chew 1 tablet by mouth daily.   TOLTERODINE (DETROL LA) 4 MG 24 HR CAPSULE    Take one tablet once daily for bladder control  Modified Medications   No medications on file  Discontinued Medications   No medications on file     Physical Exam:  Filed Vitals:   11/03/14 1036  BP: 132/62  Pulse: 74  Temp: 98.1 F (36.7 C)  TempSrc: Oral  Weight: 141 lb (63.957 kg)  SpO2: 98%    Physical Exam  Constitutional: She appears well-developed and well-nourished.  HENT:  Bilateral wax build up.   Cardiovascular: Normal rate, regular rhythm and normal heart sounds.   Pulmonary/Chest: Effort normal and breath sounds normal.  Abdominal: Soft. Bowel sounds are normal. She exhibits no distension.  Neurological: She is alert.    Labs reviewed: Basic Metabolic Panel:  Recent Labs  40/98/11 1154 05/05/14 1037 08/26/14 0844  NA 138 139 141  K 4.7 4.9 4.2  CL 99 99 100  CO2 27 27 28   GLUCOSE 90 107* 109*  BUN 28* 31* 32*  CREATININE 0.84 0.90 1.16*  CALCIUM 9.3 9.2 9.6  TSH 0.797 1.040  --    Liver Function Tests:  Recent Labs  11/24/13 1154 05/05/14 1037  AST 23 22  ALT 9 15  ALKPHOS 74 95  BILITOT 0.7 0.4  PROT 6.8 7.1   No results for input(s): LIPASE, AMYLASE in the last 8760 hours. No results for input(s): AMMONIA in the last 8760 hours. CBC: No results for input(s): WBC, NEUTROABS, HGB, HCT, MCV, PLT in the last 8760 hours. Lipid Panel:  Recent Labs  11/24/13 1154 05/05/14 1037  CHOL 152 143  HDL 66 72  LDLCALC 75 65  TRIG 55 29  CHOLHDL 2.3 2.0   TSH:  Recent Labs  11/24/13 1154 05/05/14 1037  TSH 0.797 1.040   A1C: Lab Results  Component Value Date   HGBA1C 6.5* 08/26/2014     Assessment/Plan 1.  Cerumen impaction, bilateral -MA flushed ears bilaterally, pt tolerated well  2. Weight loss 3 lb weigh loss since June, not currently on supplements   -to liberalized diet, start nutritional supplement goal 2 daily but to at least try to get 1 supplement in  daily  -to cont current MVI  Sharonne Ricketts K. Biagio Borg  [facility] [phone] 8 am - 5 pm) [phone] (after hours)

## 2014-11-03 NOTE — Patient Instructions (Signed)
Increase water to 40 oz a day  Noted weight loss--- To drink 2 boost/ensure/resource (nutritional supplement drink) daily as a supplement-- NOT meal replacement  Cont multivitamin

## 2014-11-04 ENCOUNTER — Telehealth: Payer: Self-pay

## 2014-11-04 NOTE — Telephone Encounter (Signed)
Received request for surgery clearance from [facility]  Left total knee replacement, form given to Forest Becker to make appointment

## 2014-12-02 ENCOUNTER — Other Ambulatory Visit: Payer: Medicare Other

## 2014-12-02 DIAGNOSIS — I798 Other disorders of arteries, arterioles and capillaries in diseases classified elsewhere: Secondary | ICD-10-CM | POA: Diagnosis not present

## 2014-12-02 DIAGNOSIS — E1151 Type 2 diabetes mellitus with diabetic peripheral angiopathy without gangrene: Secondary | ICD-10-CM | POA: Diagnosis not present

## 2014-12-02 DIAGNOSIS — E039 Hypothyroidism, unspecified: Secondary | ICD-10-CM | POA: Diagnosis not present

## 2014-12-03 ENCOUNTER — Other Ambulatory Visit: Payer: Self-pay | Admitting: Internal Medicine

## 2014-12-03 LAB — COMPREHENSIVE METABOLIC PANEL
ALBUMIN: 4 g/dL (ref 3.2–4.6)
ALK PHOS: 83 IU/L (ref 39–117)
ALT: 13 IU/L (ref 0–32)
AST: 20 IU/L (ref 0–40)
Albumin/Globulin Ratio: 1.3 (ref 1.1–2.5)
BUN / CREAT RATIO: 39 — AB (ref 11–26)
BUN: 39 mg/dL — AB (ref 10–36)
Bilirubin Total: 0.4 mg/dL (ref 0.0–1.2)
CO2: 25 mmol/L (ref 18–29)
CREATININE: 1 mg/dL (ref 0.57–1.00)
Calcium: 9.4 mg/dL (ref 8.7–10.3)
Chloride: 100 mmol/L (ref 97–108)
GFR, EST AFRICAN AMERICAN: 57 mL/min/{1.73_m2} — AB (ref >59)
GFR, EST NON AFRICAN AMERICAN: 50 mL/min/{1.73_m2} — AB (ref >59)
GLOBULIN, TOTAL: 3.2 g/dL (ref 1.5–4.5)
GLUCOSE: 102 mg/dL — AB (ref 65–99)
Potassium: 4.3 mmol/L (ref 3.5–5.2)
SODIUM: 140 mmol/L (ref 134–144)
TOTAL PROTEIN: 7.2 g/dL (ref 6.0–8.5)

## 2014-12-03 LAB — HEMOGLOBIN A1C
Est. average glucose Bld gHb Est-mCnc: 128 mg/dL
Hgb A1c MFr Bld: 6.1 % — ABNORMAL HIGH (ref 4.8–5.6)

## 2014-12-03 LAB — TSH: TSH: 0.755 u[IU]/mL (ref 0.450–4.500)

## 2014-12-07 ENCOUNTER — Encounter: Payer: Self-pay | Admitting: Internal Medicine

## 2014-12-07 ENCOUNTER — Ambulatory Visit (INDEPENDENT_AMBULATORY_CARE_PROVIDER_SITE_OTHER): Payer: Medicare Other | Admitting: Internal Medicine

## 2014-12-07 VITALS — BP 128/70 | HR 68 | Temp 97.8°F | Resp 20 | Ht 62.0 in | Wt 142.6 lb

## 2014-12-07 DIAGNOSIS — I10 Essential (primary) hypertension: Secondary | ICD-10-CM

## 2014-12-07 DIAGNOSIS — E1151 Type 2 diabetes mellitus with diabetic peripheral angiopathy without gangrene: Secondary | ICD-10-CM | POA: Diagnosis not present

## 2014-12-07 DIAGNOSIS — R609 Edema, unspecified: Secondary | ICD-10-CM | POA: Diagnosis not present

## 2014-12-07 DIAGNOSIS — E039 Hypothyroidism, unspecified: Secondary | ICD-10-CM | POA: Diagnosis not present

## 2014-12-07 DIAGNOSIS — M25561 Pain in right knee: Secondary | ICD-10-CM | POA: Diagnosis not present

## 2014-12-07 DIAGNOSIS — I872 Venous insufficiency (chronic) (peripheral): Secondary | ICD-10-CM

## 2014-12-07 DIAGNOSIS — Z23 Encounter for immunization: Secondary | ICD-10-CM | POA: Diagnosis not present

## 2014-12-07 DIAGNOSIS — Z01810 Encounter for preprocedural cardiovascular examination: Secondary | ICD-10-CM | POA: Insufficient documentation

## 2014-12-07 NOTE — Progress Notes (Signed)
Patient ID: Jessica Hawkins, female   DOB: [birth year], 81 y.o.   MRN: [id number]    Facility  [place]    Place of Service:   OFFICE    Allergies  Allergen Reactions  . Cabbage     unknown    Chief Complaint  Patient presents with  . Medical Management of Chronic Issues    3 month follow-up for Hypertension, Hyperlipidemia, Hypothroidism, DM  . Immunizations    Would like flu shot today    HPI:   Controlled type 2 DM with peripheral circulatory disorder ([employer]) - diabetes mellitus under excellent control. See recent lab.  Essential hypertension - controlled  Hypothyroidism, unspecified hypothyroidism type - compensated  Venous insufficiency (chronic) (peripheral) - contributes to chronic lower leg edema  Arthralgia of right lower leg  - patient has seen Dr. Marchia Bond.  She still needs to see a cardiologist preoperatively, but if she passes a preoperative cardiology evaluation, she should probably have the surgery offered by Dr. Mardelle Matte for knee replacement .patient and family were concerned that her mobility issues had increased to the point that it looked like she was going to have to occupy a wheelchair. She has diligently reported for exercises in the water, physical therapy, and has followed directions in regards to the leg edema. Her legs are far less swollen at this point than they were in the past.   Edema - Chronic lower leg edema likely related to venous insufficiency. She is wearing compression stockings. Edema has responded to current treatment.    Medications: Patient's Medications  New Prescriptions   No medications on file  Previous Medications   AMBULATORY NON FORMULARY MEDICATION    Knee High Compression Hose with Zippers for both legs. Diagnosis M25.561, R60.9, I87.2   ASPIRIN 81 MG TABLET    Take 81 mg by mouth daily.   BAYER MICROLET LANCETS LANCETS    Use as instructed to check blood sugar once daily.250.00   CALCIUM CARBONATE PO    Take 1 tablet by  mouth daily.   FUROSEMIDE (LASIX) 40 MG TABLET    TAKE 1 TABLET DAILY FOR EDEMA   GLUCOSE BLOOD (BAYER CONTOUR TEST) TEST STRIP    Use as instructed to check blood sugar once daily. 250.00   GLUCOSE BLOOD TEST STRIP    Test blood sugar once daily   LEVOTHYROXINE (SYNTHROID, LEVOTHROID) 25 MCG TABLET    TAKE 1 TABLET DAILY FOR THYROID SUPPLEMENT   LOSARTAN (COZAAR) 50 MG TABLET    TAKE 1 TABLET DAILY TO CONTROL BLOOD PRESSURE   MULTIVITAMIN-IRON-MINERALS-FOLIC ACID (CENTRUM) CHEWABLE TABLET    Chew 1 tablet by mouth daily.   TOLTERODINE (DETROL LA) 4 MG 24 HR CAPSULE    TAKE 1 CAPSULE DAILY FOR BLADDER CONTROL  Modified Medications   No medications on file  Discontinued Medications   No medications on file    Review of Systems  Constitutional: Negative for fever, chills, activity change, appetite change, fatigue and unexpected weight change.  HENT: Negative.   Eyes: Negative.   Respiratory: Negative.  Negative for cough and shortness of breath.   Cardiovascular: Positive for leg swelling. Negative for chest pain and palpitations.  Gastrointestinal: Positive for constipation. Negative for vomiting, diarrhea and abdominal distention.       Incontinent of stool and urine sometimes  Endocrine: Negative for cold intolerance, heat intolerance, polydipsia, polyphagia and polyuria.  Genitourinary:       Incontinent  Musculoskeletal: Positive for myalgias, back pain, joint swelling,  arthralgias and gait problem.  Skin: Negative.   Neurological: Negative.   Hematological: Negative.   Psychiatric/Behavioral: Negative.     Filed Vitals:   12/07/14 1358  BP: 128/70  Pulse: 68  Temp: 97.8 F (36.6 C)  TempSrc: Oral  Resp: 20  Height: '5\' 2"'  (1.575 m)  Weight: 142 lb 9.6 oz (64.683 kg)  SpO2: 97%   Body mass index is 26.08 kg/(m^2).  Physical Exam  Constitutional: She is oriented to person, place, and time. She appears well-developed and well-nourished. No distress.  frail  HENT:    Head: Normocephalic and atraumatic.  Left Ear: External ear normal.  Nose: Nose normal.  Bilateral wax build up.   Eyes: Conjunctivae and EOM are normal. Pupils are equal, round, and reactive to light.  Neck: Normal range of motion. Neck supple. No JVD present. No tracheal deviation present. No thyromegaly present.  Cardiovascular: Normal rate, regular rhythm, normal heart sounds and intact distal pulses.  Exam reveals no gallop and no friction rub.   No murmur heard. Varicose veins.  Pulmonary/Chest: Effort normal and breath sounds normal. No respiratory distress. She has no wheezes. She has no rales.  Abdominal: Soft. Bowel sounds are normal. She exhibits no distension and no mass. There is no tenderness.  Musculoskeletal: She exhibits edema (1+ right and 2+ edema) and tenderness (both knees).  Poor balance. Using walker.  Lymphadenopathy:    She has no cervical adenopathy.  Neurological: She is alert and oriented to person, place, and time. No cranial nerve deficit. Coordination normal.  Skin: No rash noted. No erythema. No pallor.  Seborrheic keratoses  Psychiatric: She has a normal mood and affect. Her behavior is normal. Thought content normal.    Labs reviewed: Lab Summary Latest Ref Rng 12/02/2014 08/26/2014 05/05/2014 11/24/2013  Hemoglobin 13.0-17.0 g/dL (None) (None) (None) (None)  Hematocrit 39.0-52.0 % (None) (None) (None) (None)  White count - (None) (None) (None) (None)  Platelet count - (None) (None) (None) (None)  Sodium 134 - 144 mmol/L 140 141 139 138  Potassium 3.5 - 5.2 mmol/L 4.3 4.2 4.9 4.7  Calcium 8.7 - 10.3 mg/dL 9.4 9.6 9.2 9.3  Phosphorus - (None) (None) (None) (None)  Creatinine 0.57 - 1.00 mg/dL 1.00 1.16(H) 0.90 0.84  AST 0 - 40 IU/L 20 (None) 22 23  Alk Phos 39 - 117 IU/L 83 (None) 95 74  Bilirubin 0.0 - 1.2 mg/dL 0.4 (None) 0.4 0.7  Glucose 65 - 99 mg/dL 102(H) 109(H) 107(H) 90  Cholesterol - (None) (None) (None) (None)  HDL cholesterol >39 mg/dL  (None) (None) 72 66  Triglycerides 0 - 149 mg/dL (None) (None) 29 55  LDL Direct - (None) (None) (None) (None)  LDL Calc 0 - 99 mg/dL (None) (None) 65 75  Total protein - (None) (None) (None) (None)  Albumin 3.2 - 4.6 g/dL 4.0 (None) 4.0 3.9   Lab Results  Component Value Date   TSH 0.755 12/02/2014   Lab Results  Component Value Date   BUN 39* 12/02/2014   Lab Results  Component Value Date   HGBA1C 6.1* 12/02/2014    Assessment/Plan  1. Controlled type 2 DM with peripheral circulatory disorder (HCC) - Hemoglobin A1c; Future - Comprehensive metabolic panel; Future  2. Essential hypertension - Comprehensive metabolic panel; Future  3. Hypothyroidism, unspecified hypothyroidism type - TSH; Future  4. Venous insufficiency (chronic) (peripheral)  Continue compression stockings  5. Arthralgia of right lower leg  Preoperative cardiovascular examination is to be scheduled. Pending the advice,  she will make her final decision regarding the knee replacement surgery offered by Dr. Mardelle Matte.  6. Pre-operative cardiovascular examination - Ambulatory referral to Cardiology  7. Edema, unspecified type  Continue compression stockings*

## 2015-01-11 ENCOUNTER — Telehealth: Payer: Self-pay

## 2015-01-11 DIAGNOSIS — M25512 Pain in left shoulder: Secondary | ICD-10-CM | POA: Diagnosis not present

## 2015-01-11 NOTE — Telephone Encounter (Signed)
noted 

## 2015-01-11 NOTE — Telephone Encounter (Signed)
Patient's daughter called stating patient is shaking and in severe pain. Patient c/o upper right arm pain since yesterday and its getting worse. Patient was rated at a 10 on a scale of 1-10 and 10 being the worse pain that patient has ever experienced. Patient would like to be see today.  Dr.Green is out-of office and Dr.Carter's schedule is full. I recommended Urgent Care due to the urgency of the situation. Patient's daughter verbalized understanding of recommendation.  Patient called back a couple minutes later requesting appointment. Patient states she will wait til 5 pm if the provider will agree to see her. Patient was informed again we do not have any available appointments today and I will forward message to doctor in office and to her PCP. [facility]. Patient's daughter was in the background and stated she is trying to take patient to Urgent Care although she is insisting on waiting all day to be seen here. The final decision was the daughter saying she will take patient to Urgent Care now.

## 2015-01-12 NOTE — Telephone Encounter (Signed)
Noted  

## 2015-01-16 ENCOUNTER — Telehealth: Payer: Self-pay | Admitting: *Deleted

## 2015-01-16 NOTE — Telephone Encounter (Signed)
[facility] patient's Right shoulder X-Ray Results: Moderate DJD AC joint with joint space narrowing and osteophyte formation. The glenoid humeral joint is not seen in tangent in is difficult to evaluate. No obvious osteophyte formation. No fractures or acute abnormalities.   Given to Dr. Chilton SiGreen to review and sign.

## 2015-01-17 ENCOUNTER — Encounter: Payer: Self-pay | Admitting: Internal Medicine

## 2015-01-17 ENCOUNTER — Ambulatory Visit (INDEPENDENT_AMBULATORY_CARE_PROVIDER_SITE_OTHER): Payer: Medicare Other | Admitting: Internal Medicine

## 2015-01-17 VITALS — BP 130/72 | HR 79 | Temp 97.8°F | Resp 20 | Ht 62.0 in | Wt 136.8 lb

## 2015-01-17 DIAGNOSIS — M25511 Pain in right shoulder: Secondary | ICD-10-CM | POA: Diagnosis not present

## 2015-01-17 DIAGNOSIS — M25561 Pain in right knee: Secondary | ICD-10-CM

## 2015-01-17 DIAGNOSIS — I10 Essential (primary) hypertension: Secondary | ICD-10-CM

## 2015-01-17 DIAGNOSIS — R269 Unspecified abnormalities of gait and mobility: Secondary | ICD-10-CM

## 2015-01-17 NOTE — Patient Instructions (Signed)
Try Salonpas with Lidocaine. Available over the counter.

## 2015-01-17 NOTE — Progress Notes (Signed)
Patient ID: Jessica Hawkins, female   DOB: 04-Jul-1922, 81 y.o.   MRN: [id number]    Facility  [place]    Place of Service:   OFFICE    Allergies  Allergen Reactions  . Cabbage     unknown    Chief Complaint  Patient presents with  . Medical Management of Chronic Issues    ? regarding Lidoderm patches, pain in right arm. had a x-ray  that is on CD    HPI:  This is a follow-up visit for acute onset of pain in the right shoulder for which she was seen at [facility] on [address]. X-rays were done which showed osteoarthritis of the shoulder. He received 2 injections in the shoulder area that were probably corticosteroid. Patient achieved relief following the injections. Son is with patient today and they are inquiring about use of a lidocaine patch.  Patient appears to have full range of motion of the right shoulder with minimal discomfort with palpation. She wants to know if she should go back to doing water exercises because she has held off on this since the onset of her pain of the right shoulder.  Patient missed her appointment with cardiologist for preoperative evaluation for knee surgery.  Right knee continues to be a problem with pain and contributing to gait instability. Patient is continuing with a 4. walker.  Hypertension is under control.  Medications: Patient's Medications  New Prescriptions   No medications on file  Previous Medications   AMBULATORY NON FORMULARY MEDICATION    Knee High Compression Hose with Zippers for both legs. Diagnosis M25.561, R60.9, I87.2   ASPIRIN 81 MG TABLET    Take 81 mg by mouth daily.   BAYER MICROLET LANCETS LANCETS    Use as instructed to check blood sugar once daily.250.00   CALCIUM CARBONATE PO    Take 1 tablet by mouth daily.   FUROSEMIDE (LASIX) 40 MG TABLET    TAKE 1 TABLET DAILY FOR EDEMA   GLUCOSE BLOOD (BAYER CONTOUR TEST) TEST STRIP    Use as instructed to check blood sugar once daily. 250.00   GLUCOSE BLOOD  TEST STRIP    Test blood sugar once daily   LEVOTHYROXINE (SYNTHROID, LEVOTHROID) 25 MCG TABLET    TAKE 1 TABLET DAILY FOR THYROID SUPPLEMENT   LOSARTAN (COZAAR) 50 MG TABLET    TAKE 1 TABLET DAILY TO CONTROL BLOOD PRESSURE   MULTIVITAMIN-IRON-MINERALS-FOLIC ACID (CENTRUM) CHEWABLE TABLET    Chew 1 tablet by mouth daily.   TOLTERODINE (DETROL LA) 4 MG 24 HR CAPSULE    TAKE 1 CAPSULE DAILY FOR BLADDER CONTROL  Modified Medications   No medications on file  Discontinued Medications   No medications on file    Review of Systems  Constitutional: Negative for fever, chills, activity change, appetite change, fatigue and unexpected weight change.  HENT: Negative.   Eyes: Negative.   Respiratory: Negative.  Negative for cough and shortness of breath.   Cardiovascular: Positive for leg swelling. Negative for chest pain and palpitations.  Gastrointestinal: Positive for constipation. Negative for vomiting, diarrhea and abdominal distention.       Incontinent of stool and urine sometimes  Endocrine: Negative for cold intolerance, heat intolerance, polydipsia, polyphagia and polyuria.  Genitourinary:       Incontinent  Musculoskeletal: Positive for myalgias, back pain, joint swelling, arthralgias and gait problem.       Right shoulder pain. Right knee pain.  Skin: Negative.   Neurological:  Negative.   Hematological: Negative.   Psychiatric/Behavioral: Negative.     Filed Vitals:   01/17/15 1605  BP: 130/72  Pulse: 79  Temp: 97.8 F (36.6 C)  TempSrc: Oral  Resp: 20  Height: 5' 2" (1.575 m)  Weight: 136 lb 12.8 oz (62.052 kg)  SpO2: 97%   Body mass index is 25.01 kg/(m^2).  Physical Exam  Constitutional: She is oriented to person, place, and time. She appears well-developed and well-nourished. No distress.  frail  HENT:  Head: Normocephalic and atraumatic.  Left Ear: External ear normal.  Nose: Nose normal.  Bilateral wax build up.   Eyes: Conjunctivae and EOM are normal. Pupils  are equal, round, and reactive to light.  Neck: Normal range of motion. Neck supple. No JVD present. No tracheal deviation present. No thyromegaly present.  Cardiovascular: Normal rate, regular rhythm, normal heart sounds and intact distal pulses.  Exam reveals no gallop and no friction rub.   No murmur heard. Varicose veins.  Pulmonary/Chest: Effort normal and breath sounds normal. No respiratory distress. She has no wheezes. She has no rales.  Abdominal: Soft. Bowel sounds are normal. She exhibits no distension and no mass. There is no tenderness.  Musculoskeletal: She exhibits edema (1+ right and 2+ edema) and tenderness (both knees).  Poor balance. Using walker. Pain at the right shoulder and some crepitance noted with rotational movements.  Lymphadenopathy:    She has no cervical adenopathy.  Neurological: She is alert and oriented to person, place, and time. No cranial nerve deficit. Coordination normal.  Skin: No rash noted. No erythema. No pallor.  Seborrheic keratoses  Psychiatric: She has a normal mood and affect. Her behavior is normal. Thought content normal.    Labs reviewed: Lab Summary Latest Ref Rng 12/02/2014 08/26/2014 05/05/2014 11/24/2013  Hemoglobin 13.0-17.0 g/dL (None) (None) (None) (None)  Hematocrit 39.0-52.0 % (None) (None) (None) (None)  White count - (None) (None) (None) (None)  Platelet count - (None) (None) (None) (None)  Sodium 134 - 144 mmol/L 140 141 139 138  Potassium 3.5 - 5.2 mmol/L 4.3 4.2 4.9 4.7  Calcium 8.7 - 10.3 mg/dL 9.4 9.6 9.2 9.3  Phosphorus - (None) (None) (None) (None)  Creatinine 0.57 - 1.00 mg/dL 1.00 1.16(H) 0.90 0.84  AST 0 - 40 IU/L 20 (None) 22 23  Alk Phos 39 - 117 IU/L 83 (None) 95 74  Bilirubin 0.0 - 1.2 mg/dL 0.4 (None) 0.4 0.7  Glucose 65 - 99 mg/dL 102(H) 109(H) 107(H) 90  Cholesterol - (None) (None) (None) (None)  HDL cholesterol >39 mg/dL (None) (None) 72 66  Triglycerides 0 - 149 mg/dL (None) (None) 29 55  LDL Direct -  (None) (None) (None) (None)  LDL Calc 0 - 99 mg/dL (None) (None) 65 75  Total protein - (None) (None) (None) (None)  Albumin 3.2 - 4.6 g/dL 4.0 (None) 4.0 3.9   Lab Results  Component Value Date   TSH 0.755 12/02/2014   Lab Results  Component Value Date   BUN 39* 12/02/2014   Lab Results  Component Value Date   HGBA1C 6.1* 12/02/2014    Assessment/Plan  1. Right shoulder pain -Try Salonpas with lidocaine patches. Available OTC. -Resume water therapy and strengthening  2. Arthralgia of right lower leg Patient is considering surgery. She needs to reschedule her appointment with Dr. Ganji, cardiologist.  3. Abnormality of gait Continue use of walker for safety  4. Essential hypertension Controlled   

## 2015-01-18 DIAGNOSIS — M25519 Pain in unspecified shoulder: Secondary | ICD-10-CM | POA: Insufficient documentation

## 2015-02-02 ENCOUNTER — Encounter: Payer: Self-pay | Admitting: Internal Medicine

## 2015-02-08 ENCOUNTER — Encounter: Payer: Self-pay | Admitting: Podiatry

## 2015-02-08 ENCOUNTER — Ambulatory Visit (INDEPENDENT_AMBULATORY_CARE_PROVIDER_SITE_OTHER): Payer: Medicare Other | Admitting: Podiatry

## 2015-02-08 DIAGNOSIS — B351 Tinea unguium: Secondary | ICD-10-CM | POA: Diagnosis not present

## 2015-02-08 DIAGNOSIS — M79676 Pain in unspecified toe(s): Secondary | ICD-10-CM

## 2015-02-08 NOTE — Patient Instructions (Addendum)
For the fungal toenails I recommend debridement (trimming) at three-month intervals. Gen. information about diabetic foot care below Includes the importance of drying in between the toes after showering or bathing   Diabetes and Foot Care Diabetes may cause you to have problems because of poor blood supply (circulation) to your feet and legs. This may cause the skin on your feet to become thinner, break easier, and heal more slowly. Your skin may become dry, and the skin may peel and crack. You may also have nerve damage in your legs and feet causing decreased feeling in them. You may not notice minor injuries to your feet that could lead to infections or more serious problems. Taking care of your feet is one of the most important things you can do for yourself.  HOME CARE INSTRUCTIONS  Wear shoes at all times, even in the house. Do not go barefoot. Bare feet are easily injured.  Check your feet daily for blisters, cuts, and redness. If you cannot see the bottom of your feet, use a mirror or ask someone for help.  Wash your feet with warm water (do not use hot water) and mild soap. Then pat your feet and the areas between your toes until they are completely dry. Do not soak your feet as this can dry your skin.  Apply a moisturizing lotion or petroleum jelly (that does not contain alcohol and is unscented) to the skin on your feet and to dry, brittle toenails. Do not apply lotion between your toes.  Trim your toenails straight across. Do not dig under them or around the cuticle. File the edges of your nails with an emery board or nail file.  Do not cut corns or calluses or try to remove them with medicine.  Wear clean socks or stockings every day. Make sure they are not too tight. Do not wear knee-high stockings since they may decrease blood flow to your legs.  Wear shoes that fit properly and have enough cushioning. To break in new shoes, wear them for just a few hours a day. This prevents you  from injuring your feet. Always look in your shoes before you put them on to be sure there are no objects inside.  Do not cross your legs. This may decrease the blood flow to your feet.  If you find a minor scrape, cut, or break in the skin on your feet, keep it and the skin around it clean and dry. These areas may be cleansed with mild soap and water. Do not cleanse the area with peroxide, alcohol, or iodine.  When you remove an adhesive bandage, be sure not to damage the skin around it.  If you have a wound, look at it several times a day to make sure it is healing.  Do not use heating pads or hot water bottles. They may burn your skin. If you have lost feeling in your feet or legs, you may not know it is happening until it is too late.  Make sure your health care provider performs a complete foot exam at least annually or more often if you have foot problems. Report any cuts, sores, or bruises to your health care provider immediately. SEEK MEDICAL CARE IF:   You have an injury that is not healing.  You have cuts or breaks in the skin.  You have an ingrown nail.  You notice redness on your legs or feet.  You feel burning or tingling in your legs or feet.  You  have pain or cramps in your legs and feet.  Your legs or feet are numb.  Your feet always feel cold. SEEK IMMEDIATE MEDICAL CARE IF:   There is increasing redness, swelling, or pain in or around a wound.  There is a red line that goes up your leg.  Pus is coming from a wound.  You develop a fever or as directed by your health care provider.  You notice a bad smell coming from an ulcer or wound.   This information is not intended to replace advice given to you by your health care provider. Make sure you discuss any questions you have with your health care provider.   Document Released: 02/16/2000 Document Revised: 10/21/2012 Document Reviewed: 07/28/2012 Elsevier Interactive Patient Education [employer].

## 2015-02-09 DIAGNOSIS — R739 Hyperglycemia, unspecified: Secondary | ICD-10-CM | POA: Diagnosis not present

## 2015-02-09 DIAGNOSIS — R600 Localized edema: Secondary | ICD-10-CM | POA: Diagnosis not present

## 2015-02-09 DIAGNOSIS — Z01810 Encounter for preprocedural cardiovascular examination: Secondary | ICD-10-CM | POA: Diagnosis not present

## 2015-02-09 DIAGNOSIS — I10 Essential (primary) hypertension: Secondary | ICD-10-CM | POA: Diagnosis not present

## 2015-02-09 NOTE — Progress Notes (Signed)
Patient ID: Jessica MunchPauline G Kever, female   DOB: 06/13/1922, 81 y.o.   MRN: [id number]  Subjective:  This patient presents again today complaining of thick and long toenails are strong comfortable walking wearing shoes and requests nail debridement. The last visit for this similar service was on 03/07/2014 Patient's daughter present in treatment room today. Patient's son wanted specific instructions for her mother's foot care with emphasis of drying after showering and between your toes  Objective: Pitting edema bilaterally The toenails are extremely elongated, brittle, deformed, discolored and tender to palpation 6-10 No open skin lesions bilaterally  Assessment: Type II diabetic with a history of peripheral arterial disease and peripheral neuropathy Symptomatic onychomycoses 6-10  Plan: Debridement toenails 6-10 mechanically and electrically without any bleeding  Gen. information about diabetic foot care provided in the after visit summary with mention of repetitive debridement for fungal toenails and the importance of drying in between your toes after showering or bathing.  Return intervals recommended at 3 months

## 2015-02-24 ENCOUNTER — Other Ambulatory Visit: Payer: Self-pay

## 2015-02-24 ENCOUNTER — Telehealth (INDEPENDENT_AMBULATORY_CARE_PROVIDER_SITE_OTHER): Payer: Medicare Other

## 2015-02-24 ENCOUNTER — Other Ambulatory Visit: Payer: Medicare Other

## 2015-02-24 DIAGNOSIS — R309 Painful micturition, unspecified: Secondary | ICD-10-CM | POA: Diagnosis not present

## 2015-02-24 LAB — POCT URINALYSIS DIPSTICK
BILIRUBIN UA: POSITIVE
GLUCOSE UA: NEGATIVE
Ketones, UA: NEGATIVE
Nitrite, UA: POSITIVE
Protein, UA: 100
SPEC GRAV UA: 1.02
UROBILINOGEN UA: 0.2
pH, UA: 6

## 2015-02-24 MED ORDER — CIPROFLOXACIN HCL 500 MG PO TABS: 500.0000 mg | ORAL_TABLET | Freq: Two times a day (BID) | ORAL | Status: DC

## 2015-02-24 NOTE — Telephone Encounter (Signed)
Patient called and c/o pain when urinating since yesterday off/on. 3-4 on scale of 1-10. Patient would like to see Dr.Green.  We do not have any availability for patient to be seen today. Patient was offered lab appointment to collect a clean catch urine sample.

## 2015-02-24 NOTE — Telephone Encounter (Signed)
Sent for culture.

## 2015-02-24 NOTE — Telephone Encounter (Signed)
Jessica Hawkins, daughter called and wanted to know what was done today with patient. Explained a urine was collected and sent off for culture. She stated that if patient worsens over the weekend she is going to take to Urgent Care.

## 2015-02-26 LAB — URINE CULTURE

## 2015-03-07 ENCOUNTER — Encounter: Payer: Self-pay | Admitting: Internal Medicine

## 2015-03-07 ENCOUNTER — Ambulatory Visit (INDEPENDENT_AMBULATORY_CARE_PROVIDER_SITE_OTHER): Payer: Medicare Other | Admitting: Internal Medicine

## 2015-03-07 VITALS — BP 150/62 | HR 75 | Temp 97.7°F | Resp 20 | Ht 62.0 in | Wt 159.6 lb

## 2015-03-07 DIAGNOSIS — M2400 Loose body in unspecified joint: Secondary | ICD-10-CM

## 2015-03-07 DIAGNOSIS — M170 Bilateral primary osteoarthritis of knee: Secondary | ICD-10-CM | POA: Diagnosis not present

## 2015-03-07 DIAGNOSIS — N390 Urinary tract infection, site not specified: Secondary | ICD-10-CM

## 2015-03-07 DIAGNOSIS — I10 Essential (primary) hypertension: Secondary | ICD-10-CM | POA: Diagnosis not present

## 2015-03-07 NOTE — Progress Notes (Signed)
Patient ID: Jessica Hawkins, female   DOB: 1923/02/05, 82 y.o.   MRN: [id number]    Facility  [facility]    Place of Service:   OFFICE    Allergies  Allergen Reactions  . Cabbage     unknown    Chief Complaint  Patient presents with  . Medical Management of Chronic Issues    f/u for leg swelling, left leg gives way to the weight,    HPI:  Acute urinary tract infection with Escherichia coli on 02/24/2015. Treated with Cipro.  Seen by Dr. Einar Gip and cleared from cardiovascular standpoint for surgery on her knee.  Here because her left leg gives way all of a sudden. Seems to collapse under her. Does not have a lot of pain.  Chronic edema is about the same. Using the compression stockings.    Medications: Patient's Medications  New Prescriptions   No medications on file  Previous Medications   AMBULATORY NON FORMULARY MEDICATION    Knee High Compression Hose with Zippers for both legs. Diagnosis M25.561, R60.9, I87.2   ASPIRIN 81 MG TABLET    Take 81 mg by mouth daily.   BAYER MICROLET LANCETS LANCETS    Use as instructed to check blood sugar once daily.250.00   CALCIUM CARBONATE PO    Take 1 tablet by mouth daily.   FUROSEMIDE (LASIX) 40 MG TABLET    TAKE 1 TABLET DAILY FOR EDEMA   GLUCOSE BLOOD (BAYER CONTOUR TEST) TEST STRIP    Use as instructed to check blood sugar once daily. 250.00   GLUCOSE BLOOD TEST STRIP    Test blood sugar once daily   LEVOTHYROXINE (SYNTHROID, LEVOTHROID) 25 MCG TABLET    TAKE 1 TABLET DAILY FOR THYROID SUPPLEMENT   LOSARTAN (COZAAR) 50 MG TABLET    TAKE 1 TABLET DAILY TO CONTROL BLOOD PRESSURE   MULTIVITAMIN-IRON-MINERALS-FOLIC ACID (CENTRUM) CHEWABLE TABLET    Chew 1 tablet by mouth daily.   TOLTERODINE (DETROL LA) 4 MG 24 HR CAPSULE    TAKE 1 CAPSULE DAILY FOR BLADDER CONTROL  Modified Medications   No medications on file  Discontinued Medications   CIPROFLOXACIN (CIPRO) 500 MG TABLET    Take 1 tablet (500 mg total) by mouth 2 (two) times daily.      Review of Systems  Constitutional: Negative for fever, chills, activity change, appetite change, fatigue and unexpected weight change.  HENT: Negative.   Eyes: Negative.   Respiratory: Negative.  Negative for cough and shortness of breath.   Cardiovascular: Positive for leg swelling. Negative for chest pain and palpitations.  Gastrointestinal: Positive for constipation. Negative for vomiting, diarrhea and abdominal distention.       Incontinent of stool and urine sometimes  Endocrine: Negative for cold intolerance, heat intolerance, polydipsia, polyphagia and polyuria.  Genitourinary:       Incontinent  Musculoskeletal: Positive for myalgias, back pain, joint swelling, arthralgias and gait problem.       Right shoulder pain. Right knee pain.  Skin: Negative.   Neurological: Negative.   Hematological: Negative.   Psychiatric/Behavioral: Negative.     Filed Vitals:   03/07/15 1420  BP: 150/62  Pulse: 75  Temp: 97.7 F (36.5 C)  TempSrc: Oral  Resp: 20  Height: '5\' 2"'  (1.575 m)  Weight: 159 lb 9.6 oz (72.394 kg)  SpO2: 98%   Body mass index is 29.18 kg/(m^2). Filed Weights   03/07/15 1420  Weight: 159 lb 9.6 oz (72.394 kg)     Physical  Exam  Constitutional: She is oriented to person, place, and time. She appears well-developed and well-nourished. No distress.  frail  HENT:  Head: Normocephalic and atraumatic.  Left Ear: External ear normal.  Nose: Nose normal.  Bilateral wax build up.   Eyes: Conjunctivae and EOM are normal. Pupils are equal, round, and reactive to light.  Neck: Normal range of motion. Neck supple. No JVD present. No tracheal deviation present. No thyromegaly present.  Cardiovascular: Normal rate, regular rhythm, normal heart sounds and intact distal pulses.  Exam reveals no gallop and no friction rub.   No murmur heard. Varicose veins.  Pulmonary/Chest: Effort normal and breath sounds normal. No respiratory distress. She has no wheezes. She has  no rales.  Abdominal: Soft. Bowel sounds are normal. She exhibits no distension and no mass. There is no tenderness.  Musculoskeletal: She exhibits edema (1+ right and 2+ edema) and tenderness (both knees).  Poor balance. Using walker. Pain at the right shoulder and some crepitance noted with rotational movements.  Lymphadenopathy:    She has no cervical adenopathy.  Neurological: She is alert and oriented to person, place, and time. No cranial nerve deficit. Coordination normal.  Skin: No rash noted. No erythema. No pallor.  Seborrheic keratoses  Psychiatric: She has a normal mood and affect. Her behavior is normal. Thought content normal.    Labs reviewed: Lab Summary Latest Ref Rng 12/02/2014 08/26/2014 05/05/2014 11/24/2013  Hemoglobin 13.0-17.0 g/dL (None) (None) (None) (None)  Hematocrit 39.0-52.0 % (None) (None) (None) (None)  White count - (None) (None) (None) (None)  Platelet count - (None) (None) (None) (None)  Sodium 134 - 144 mmol/L 140 141 139 138  Potassium 3.5 - 5.2 mmol/L 4.3 4.2 4.9 4.7  Calcium 8.7 - 10.3 mg/dL 9.4 9.6 9.2 9.3  Phosphorus - (None) (None) (None) (None)  Creatinine 0.57 - 1.00 mg/dL 1.00 1.16(H) 0.90 0.84  AST 0 - 40 IU/L 20 (None) 22 23  Alk Phos 39 - 117 IU/L 83 (None) 95 74  Bilirubin 0.0 - 1.2 mg/dL 0.4 (None) 0.4 0.7  Glucose 65 - 99 mg/dL 102(H) 109(H) 107(H) 90  Cholesterol - (None) (None) (None) (None)  HDL cholesterol >39 mg/dL (None) (None) 72 66  Triglycerides 0 - 149 mg/dL (None) (None) 29 55  LDL Direct - (None) (None) (None) (None)  LDL Calc 0 - 99 mg/dL (None) (None) 65 75  Total protein - (None) (None) (None) (None)  Albumin 3.2 - 4.6 g/dL 4.0 (None) 4.0 3.9   Lab Results  Component Value Date   TSH 0.755 12/02/2014   TSH 1.040 05/05/2014   TSH 0.797 11/24/2013   Lab Results  Component Value Date   BUN 39* 12/02/2014   BUN 32* 08/26/2014   BUN 31* 05/05/2014   Lab Results  Component Value Date   HGBA1C 6.1* 12/02/2014    HGBA1C 6.5* 08/26/2014   HGBA1C 6.2* 05/05/2014    Assessment/Plan  1. Recurrent UTI resolved  2. Joint mouse Left knee  3. Essential hypertension controlled  4. Primary osteoarthritis of both knees Related to current locking of the left knee at times. I advised patient to go ahead and get her surgery scheduled with Dr. Carter Kitten. I also discussed the situation with her daughter Maryann Alar by telephone.

## 2015-03-09 ENCOUNTER — Telehealth: Payer: Self-pay | Admitting: *Deleted

## 2015-03-09 NOTE — Telephone Encounter (Signed)
Urines are not repeated after antibiotics are complete unless symptoms persist.  The letter can wait on Dr.Green b/c he must know what is going on with this surgery and I do not.

## 2015-03-09 NOTE — Telephone Encounter (Signed)
Patient daughter, Jessica Hawkins called and wanted to know if you will write a letter to mail to the patient stating that her condition is not going to get better and she needs surgery. Patient is doubting and wants to put the surgery off. Daughter thinks if she has something in writing from you she will feel better about having the surgery done.   Also daughter wants patient to have a repeat Urine Culture done to make sure her infection is gone. Please Advise.

## 2015-03-10 ENCOUNTER — Other Ambulatory Visit: Payer: Medicare Other

## 2015-03-10 NOTE — Telephone Encounter (Signed)
[name] notified and agreed.

## 2015-03-14 ENCOUNTER — Ambulatory Visit: Payer: Medicare Other | Admitting: Internal Medicine

## 2015-03-21 ENCOUNTER — Ambulatory Visit: Payer: Medicare Other | Admitting: Internal Medicine

## 2015-03-22 ENCOUNTER — Other Ambulatory Visit: Payer: Self-pay | Admitting: Orthopedic Surgery

## 2015-03-31 DIAGNOSIS — R198 Other specified symptoms and signs involving the digestive system and abdomen: Secondary | ICD-10-CM | POA: Diagnosis not present

## 2015-04-11 ENCOUNTER — Ambulatory Visit (INDEPENDENT_AMBULATORY_CARE_PROVIDER_SITE_OTHER): Payer: Medicare Other | Admitting: Internal Medicine

## 2015-04-11 ENCOUNTER — Encounter: Payer: Self-pay | Admitting: Internal Medicine

## 2015-04-11 VITALS — BP 110/62 | HR 67 | Temp 97.4°F | Resp 20 | Wt 159.6 lb

## 2015-04-11 DIAGNOSIS — N760 Acute vaginitis: Secondary | ICD-10-CM | POA: Diagnosis not present

## 2015-04-11 MED ORDER — METRONIDAZOLE 500 MG PO TABS: ORAL_TABLET | ORAL | Status: DC

## 2015-04-11 MED ORDER — FLUCONAZOLE 100 MG PO TABS: ORAL_TABLET | ORAL | Status: DC

## 2015-04-11 NOTE — Progress Notes (Signed)
Patient ID: Jessica Hawkins, female   DOB: 06-Jun-1922, 82 y.o.   MRN: [id number]    Facility  [facility]    Place of Service:   OFFICE    Allergies  Allergen Reactions  . Cabbage     unknown    Chief Complaint  Patient presents with  . Acute Visit    Patientt c/o has itching in her private areas    HPI:  Laser vaginal itch. She has had the problem off and on for months. It got worse 3 days ago. She denies discharge. No fever. She is diabetic. No recent antibiotics.  Medications: Patient's Medications  New Prescriptions   No medications on file  Previous Medications   AMBULATORY NON FORMULARY MEDICATION    Knee High Compression Hose with Zippers for both legs. Diagnosis M25.561, R60.9, I87.2   ASPIRIN 81 MG TABLET    Take 81 mg by mouth daily.   BAYER MICROLET LANCETS LANCETS    Use as instructed to check blood sugar once daily.250.00   CALCIUM CARBONATE PO    Take 1 tablet by mouth daily.   FUROSEMIDE (LASIX) 40 MG TABLET    TAKE 1 TABLET DAILY FOR EDEMA   GLUCOSE BLOOD (BAYER CONTOUR TEST) TEST STRIP    Use as instructed to check blood sugar once daily. 250.00   GLUCOSE BLOOD TEST STRIP    Test blood sugar once daily   LEVOTHYROXINE (SYNTHROID, LEVOTHROID) 25 MCG TABLET    TAKE 1 TABLET DAILY FOR THYROID SUPPLEMENT   LOSARTAN (COZAAR) 50 MG TABLET    TAKE 1 TABLET DAILY TO CONTROL BLOOD PRESSURE   MULTIVITAMIN-IRON-MINERALS-FOLIC ACID (CENTRUM) CHEWABLE TABLET    Chew 1 tablet by mouth daily.   TOLTERODINE (DETROL LA) 4 MG 24 HR CAPSULE    TAKE 1 CAPSULE DAILY FOR BLADDER CONTROL  Modified Medications   No medications on file  Discontinued Medications   No medications on file    Review of Systems  Constitutional: Negative for fever, chills, activity change, appetite change, fatigue and unexpected weight change.  HENT: Negative.   Eyes: Negative.   Respiratory: Negative.  Negative for cough and shortness of breath.   Cardiovascular: Positive for leg swelling. Negative for  chest pain and palpitations.  Gastrointestinal: Positive for constipation. Negative for vomiting, diarrhea and abdominal distention.       Incontinent of stool and urine sometimes  Endocrine: Negative for cold intolerance, heat intolerance, polydipsia, polyphagia and polyuria.  Genitourinary:       Incontinent. Vaginal itching and perirectal itching without discharge. No blood in the stool. No fever.  Musculoskeletal: Positive for myalgias, back pain, joint swelling, arthralgias and gait problem.       Right shoulder pain. Right knee pain.  Skin: Negative.   Neurological: Negative.   Hematological: Negative.   Psychiatric/Behavioral: Positive for decreased concentration.    Filed Vitals:   04/11/15 1457  BP: 110/62  Pulse: 67  Temp: 97.4 F (36.3 C)  TempSrc: Oral  Resp: 20  Weight: 159 lb 9.6 oz (72.394 kg)  SpO2: 97%   Body mass index is 29.18 kg/(m^2). Filed Weights   04/11/15 1457  Weight: 159 lb 9.6 oz (72.394 kg)     Physical Exam  Constitutional: She is oriented to person, place, and time. She appears well-developed and well-nourished. No distress.  frail  HENT:  Head: Normocephalic and atraumatic.  Left Ear: External ear normal.  Nose: Nose normal.  Bilateral wax build up.   Eyes: Conjunctivae and  EOM are normal. Pupils are equal, round, and reactive to light.  Neck: Normal range of motion. Neck supple. No JVD present. No tracheal deviation present. No thyromegaly present.  Cardiovascular: Normal rate, regular rhythm, normal heart sounds and intact distal pulses.  Exam reveals no gallop and no friction rub.   No murmur heard. Varicose veins.  Pulmonary/Chest: Effort normal and breath sounds normal. No respiratory distress. She has no wheezes. She has no rales.  Abdominal: Soft. Bowel sounds are normal. She exhibits no distension and no mass. There is no tenderness.  Genitourinary:  No evidence of fungal curds. There is a nonspecific vaginal irritation. There is  no discharge. There is thinning of the tissues overall. She was able to tolerate bimanual as well as speculum exam. Stool was heme negative when checked.  Musculoskeletal: She exhibits edema (1+ right and 2+ edema) and tenderness (both knees).  Poor balance. Using walker. Pain at the right shoulder and some crepitance noted with rotational movements.  Lymphadenopathy:    She has no cervical adenopathy.  Neurological: She is alert and oriented to person, place, and time. No cranial nerve deficit. Coordination normal.  Skin: No rash noted. No erythema. No pallor.  Seborrheic keratoses  Psychiatric: She has a normal mood and affect. Her behavior is normal. Thought content normal.    Labs reviewed: Lab Summary Latest Ref Rng 12/02/2014 08/26/2014 05/05/2014  Hemoglobin 13.0-17.0 g/dL (None) (None) (None)  Hematocrit 39.0-52.0 % (None) (None) (None)  White count - (None) (None) (None)  Platelet count - (None) (None) (None)  Sodium 134 - 144 mmol/L 140 141 139  Potassium 3.5 - 5.2 mmol/L 4.3 4.2 4.9  Calcium 8.7 - 10.3 mg/dL 9.4 9.6 9.2  Phosphorus - (None) (None) (None)  Creatinine 0.57 - 1.00 mg/dL 1.00 1.16(H) 0.90  AST 0 - 40 IU/L 20 (None) 22  Alk Phos 39 - 117 IU/L 83 (None) 95  Bilirubin 0.0 - 1.2 mg/dL 0.4 (None) 0.4  Glucose 65 - 99 mg/dL 102(H) 109(H) 107(H)  Cholesterol - (None) (None) (None)  HDL cholesterol >39 mg/dL (None) (None) 72  Triglycerides 0 - 149 mg/dL (None) (None) 29  LDL Direct - (None) (None) (None)  LDL Calc 0 - 99 mg/dL (None) (None) 65  Total protein - (None) (None) (None)  Albumin 3.2 - 4.6 g/dL 4.0 (None) 4.0   Lab Results  Component Value Date   TSH 0.755 12/02/2014   TSH 1.040 05/05/2014   TSH 0.797 11/24/2013   Lab Results  Component Value Date   BUN 39* 12/02/2014   BUN 32* 08/26/2014   BUN 31* 05/05/2014   Lab Results  Component Value Date   HGBA1C 6.1* 12/02/2014   HGBA1C 6.5* 08/26/2014   HGBA1C 6.2* 05/05/2014     Assessment/Plan

## 2015-04-17 ENCOUNTER — Telehealth: Payer: Self-pay | Admitting: *Deleted

## 2015-04-17 NOTE — Telephone Encounter (Signed)
Perhaps a leg brace across the knee would be helpful. I think Dr. Dion Saucier would be the best prescriber of this if he is willing.

## 2015-04-17 NOTE — Telephone Encounter (Signed)
Jessica Hawkins, daughter called and stated patient is having numerous of episodes of leg giving out and not safe at home. Patient is 3 weeks away form surgery with Dr. Dion Saucier. Daughter wants to know what options she has to keep patient safe until the surgery, where she can go for Pre OP Services. (daughter has also placed a call to Dr. Shelba Flake office with same question). Has gotten placement for her to go to [facility] for Rehab after surgery. Please Advise.

## 2015-04-17 NOTE — Telephone Encounter (Signed)
Crist Fat daughter notified and agreed. Had to schedule an appointment to have FL2 form filled out tomorrow for pre op

## 2015-04-18 ENCOUNTER — Encounter: Payer: Self-pay | Admitting: *Deleted

## 2015-04-18 ENCOUNTER — Ambulatory Visit (INDEPENDENT_AMBULATORY_CARE_PROVIDER_SITE_OTHER): Payer: Medicare Other | Admitting: Internal Medicine

## 2015-04-18 VITALS — BP 100/50 | HR 77 | Temp 97.3°F | Resp 20 | Ht 62.0 in | Wt 152.2 lb

## 2015-04-18 DIAGNOSIS — R609 Edema, unspecified: Secondary | ICD-10-CM

## 2015-04-18 DIAGNOSIS — I10 Essential (primary) hypertension: Secondary | ICD-10-CM

## 2015-04-18 DIAGNOSIS — M2400 Loose body in unspecified joint: Secondary | ICD-10-CM

## 2015-04-18 DIAGNOSIS — Z111 Encounter for screening for respiratory tuberculosis: Secondary | ICD-10-CM | POA: Diagnosis not present

## 2015-04-18 DIAGNOSIS — E1151 Type 2 diabetes mellitus with diabetic peripheral angiopathy without gangrene: Secondary | ICD-10-CM

## 2015-04-18 DIAGNOSIS — I872 Venous insufficiency (chronic) (peripheral): Secondary | ICD-10-CM | POA: Diagnosis not present

## 2015-04-18 DIAGNOSIS — R269 Unspecified abnormalities of gait and mobility: Secondary | ICD-10-CM | POA: Diagnosis not present

## 2015-04-18 DIAGNOSIS — M170 Bilateral primary osteoarthritis of knee: Secondary | ICD-10-CM | POA: Diagnosis not present

## 2015-04-18 NOTE — Progress Notes (Signed)
Patient ID: Jessica Hawkins, female   DOB: October 25, 1922, 82 y.o.   MRN: [id number]    Facility  [facility]    Place of Service:   OFFICE    Allergies  Allergen Reactions  . Cabbage     unknown    Chief Complaint  Patient presents with  . Medical Management of Chronic Issues    FL2 form and TB test placed on Rt arm.    HPI:   Joint mouse  Abnormality of gait  Controlled type 2 DM with peripheral circulatory disorder (HCC)  Edema, unspecified type  Essential hypertension  Venous insufficiency (chronic) (peripheral)  Primary osteoarthritis of both knees    Medications: Patient's Medications  New Prescriptions   No medications on file  Previous Medications   AMBULATORY NON FORMULARY MEDICATION    Knee High Compression Hose with Zippers for both legs. Diagnosis M25.561, R60.9, I87.2   ASPIRIN 81 MG TABLET    Take 81 mg by mouth daily.   BAYER MICROLET LANCETS LANCETS    Use as instructed to check blood sugar once daily.250.00   CALCIUM CARBONATE PO    Take 1 tablet by mouth daily.   FLUCONAZOLE (DIFLUCAN) 100 MG TABLET    1 tablet daily for 3 days for vaginitis   FUROSEMIDE (LASIX) 40 MG TABLET    TAKE 1 TABLET DAILY FOR EDEMA   GLUCOSE BLOOD (BAYER CONTOUR TEST) TEST STRIP    Use as instructed to check blood sugar once daily. 250.00   GLUCOSE BLOOD TEST STRIP    Test blood sugar once daily   LEVOTHYROXINE (SYNTHROID, LEVOTHROID) 25 MCG TABLET    TAKE 1 TABLET DAILY FOR THYROID SUPPLEMENT   LOSARTAN (COZAAR) 50 MG TABLET    TAKE 1 TABLET DAILY TO CONTROL BLOOD PRESSURE   METRONIDAZOLE (FLAGYL) 500 MG TABLET    Take one tablet 3 times daily for vaginitis   MULTIVITAMIN-IRON-MINERALS-FOLIC ACID (CENTRUM) CHEWABLE TABLET    Chew 1 tablet by mouth daily.   TOLTERODINE (DETROL LA) 4 MG 24 HR CAPSULE    TAKE 1 CAPSULE DAILY FOR BLADDER CONTROL  Modified Medications   No medications on file  Discontinued Medications   No medications on file    Review of Systems    Constitutional: Negative for fever, chills, activity change, appetite change, fatigue and unexpected weight change.  HENT: Negative.   Eyes: Negative.   Respiratory: Negative.  Negative for cough and shortness of breath.   Cardiovascular: Positive for leg swelling. Negative for chest pain and palpitations.  Gastrointestinal: Positive for constipation. Negative for vomiting, diarrhea and abdominal distention.       Incontinent of stool and urine sometimes  Endocrine: Negative for cold intolerance, heat intolerance, polydipsia, polyphagia and polyuria.  Genitourinary:       Incontinent. Vaginal itching and perirectal itching without discharge. No blood in the stool. No fever.  Musculoskeletal: Positive for myalgias, back pain, joint swelling, arthralgias and gait problem.       Right shoulder pain. Right knee pain.  Skin: Negative.   Neurological: Negative.   Hematological: Negative.   Psychiatric/Behavioral: Positive for decreased concentration.    Filed Vitals:   04/18/15 1416  BP: 100/50  Pulse: 77  Temp: 97.3 F (36.3 C)  TempSrc: Oral  Resp: 20  Height: 5' 2" (1.575 m)  Weight: 152 lb 3.2 oz (69.037 kg)  SpO2: 95%   Body mass index is 27.83 kg/(m^2). Filed Weights   04/18/15 1416  Weight: 152 lb 3.2 oz (  69.037 kg)     Physical Exam  Constitutional: She is oriented to person, place, and time. She appears well-developed and well-nourished. No distress.  frail  HENT:  Head: Normocephalic and atraumatic.  Left Ear: External ear normal.  Nose: Nose normal.  Bilateral wax build up.   Eyes: Conjunctivae and EOM are normal. Pupils are equal, round, and reactive to light.  Neck: Normal range of motion. Neck supple. No JVD present. No tracheal deviation present. No thyromegaly present.  Cardiovascular: Normal rate, regular rhythm, normal heart sounds and intact distal pulses.  Exam reveals no gallop and no friction rub.   No murmur heard. Varicose veins.  Pulmonary/Chest:  Effort normal and breath sounds normal. No respiratory distress. She has no wheezes. She has no rales.  Abdominal: Soft. Bowel sounds are normal. She exhibits no distension and no mass. There is no tenderness.  Genitourinary:  No evidence of fungal curds. There is a nonspecific vaginal irritation. There is no discharge. There is thinning of the tissues overall. She was able to tolerate bimanual as well as speculum exam. Stool was heme negative when checked.  Musculoskeletal: She exhibits edema (1+ right and 2+ edema) and tenderness (both knees).  Poor balance. Using walker. Pain at the right shoulder and some crepitance noted with rotational movements.  Lymphadenopathy:    She has no cervical adenopathy.  Neurological: She is alert and oriented to person, place, and time. No cranial nerve deficit. Coordination normal.  Skin: No rash noted. No erythema. No pallor.  Seborrheic keratoses  Psychiatric: She has a normal mood and affect. Her behavior is normal. Thought content normal.    Labs reviewed: Lab Summary Latest Ref Rng 12/02/2014 08/26/2014 05/05/2014  Hemoglobin 13.0-17.0 g/dL (None) (None) (None)  Hematocrit 39.0-52.0 % (None) (None) (None)  White count - (None) (None) (None)  Platelet count - (None) (None) (None)  Sodium 134 - 144 mmol/L 140 141 139  Potassium 3.5 - 5.2 mmol/L 4.3 4.2 4.9  Calcium 8.7 - 10.3 mg/dL 9.4 9.6 9.2  Phosphorus - (None) (None) (None)  Creatinine 0.57 - 1.00 mg/dL 1.00 1.16(H) 0.90  AST 0 - 40 IU/L 20 (None) 22  Alk Phos 39 - 117 IU/L 83 (None) 95  Bilirubin 0.0 - 1.2 mg/dL 0.4 (None) 0.4  Glucose 65 - 99 mg/dL 102(H) 109(H) 107(H)  Cholesterol - (None) (None) (None)  HDL cholesterol >39 mg/dL (None) (None) 72  Triglycerides 0 - 149 mg/dL (None) (None) 29  LDL Direct - (None) (None) (None)  LDL Calc 0 - 99 mg/dL (None) (None) 65  Total protein - (None) (None) (None)  Albumin 3.2 - 4.6 g/dL 4.0 (None) 4.0   Lab Results  Component Value Date   TSH  0.755 12/02/2014   TSH 1.040 05/05/2014   TSH 0.797 11/24/2013   Lab Results  Component Value Date   BUN 39* 12/02/2014   BUN 32* 08/26/2014   BUN 31* 05/05/2014   Lab Results  Component Value Date   HGBA1C 6.1* 12/02/2014   HGBA1C 6.5* 08/26/2014   HGBA1C 6.2* 05/05/2014    Assessment/Plan  1. Joint mouse Causes left Knee to be a "trick knee". It will occasionally lock up and it makes the knee so that she cannot walk.  2.Abnormality of gait Using walker regularly  3. Controlled type 2 DM with peripheral circulatory disorder (HCC) Stable  4. Edema, unspecified type Unchanged  5. Essential hypertension Controlled  6. Venous insufficiency (chronic) (peripheral) Causes the chronic edema of the legs  7. Primary osteoarthritis of both knees Degenerative condition. Patient is scheduled for surgery.  Due to the inability to walk sometimes due to the joint mouse secondary to her degenerative arthritis of the knees, patient's daughter is looking into getting her into an assisted living facility for caretaking, possible physical therapy, and medication supervision as well as dietary supervision prior to her scheduled surgery.

## 2015-04-18 NOTE — Addendum Note (Signed)
Addended by: Lamont Snowball on: 04/18/2015 03:22 PM   Modules accepted: Orders

## 2015-04-20 LAB — TB SKIN TEST
Induration: 0 mm
TB SKIN TEST: NEGATIVE

## 2015-04-24 ENCOUNTER — Telehealth: Payer: Self-pay | Admitting: *Deleted

## 2015-04-24 MED ORDER — LEVOTHYROXINE SODIUM 25 MCG PO TABS: ORAL_TABLET | ORAL | Status: DC

## 2015-04-24 MED ORDER — LOSARTAN POTASSIUM 50 MG PO TABS: ORAL_TABLET | ORAL | Status: DC

## 2015-04-24 MED ORDER — TOLTERODINE TARTRATE ER 4 MG PO CP24: ORAL_CAPSULE | ORAL | Status: DC

## 2015-04-24 MED ORDER — FUROSEMIDE 40 MG PO TABS: ORAL_TABLET | ORAL | Status: DC

## 2015-04-24 NOTE — Telephone Encounter (Signed)
Shawna with [facility] [phone] called and requested refill on all of patient's medications. Informed her that these have been refilled through [facility] today when patient's daughter, Windell Moulding called and requested. Artist Pais will call family.

## 2015-04-24 NOTE — Telephone Encounter (Signed)
Received fax forms from [place] [facility] #[phone] [facility] Verification. Given to Dr. Chilton Si with last OV and face sheet attached to review and sign. To be faxed back to [facility] Fax: [phone]

## 2015-04-24 NOTE — Telephone Encounter (Signed)
Patient daughter, Windell Moulding called and stated that patient needs an order for Compression Hoses to be faxed to [facility] for them to help her put them on and off. Please Advise.  Fax: [phone] Also [facility] Faxed.

## 2015-04-25 NOTE — Telephone Encounter (Signed)
Call in a Prescription for knee-high medium compression strength compression stockings. Medications May be refilled.

## 2015-04-25 NOTE — Telephone Encounter (Signed)
Faxed order to [facility]. Facility to help patient put on and take off Compression stockings daily.

## 2015-04-26 DIAGNOSIS — M170 Bilateral primary osteoarthritis of knee: Secondary | ICD-10-CM | POA: Diagnosis not present

## 2015-04-27 ENCOUNTER — Inpatient Hospital Stay (HOSPITAL_COMMUNITY)
Admission: RE | Admit: 2015-04-27 | Discharge: 2015-04-27 | Disposition: A | Discharge status: Home / Self Care | Payer: BC Managed Care – PPO | Source: Ambulatory Visit

## 2015-04-27 DIAGNOSIS — I872 Venous insufficiency (chronic) (peripheral): Secondary | ICD-10-CM | POA: Diagnosis not present

## 2015-04-27 DIAGNOSIS — M7989 Other specified soft tissue disorders: Secondary | ICD-10-CM | POA: Diagnosis not present

## 2015-04-27 NOTE — Pre-Procedure Instructions (Signed)
Jessica Hawkins  04/27/2015      [facility] - [place], [place] - [address] [place] [address] Phone: [phone] Fax: [phone]  [facility] DELIVERY - [place], [place] - [address] [facility] [address] [place] [address] Phone: [phone] Fax: [phone]  [facility] DELIVERY - [place], [place] - [address] [place] [address] Phone: [phone] Fax: [phone]    Your procedure is scheduled on March 7th, Tuesday   Report to [facility] Admitting at 5:30 AM             ( Posted Surgery time 7:30 - 10:10 am)   Call this number if you have problems the morning of surgery:  [phone]   Remember:  Do not eat food or drink liquids after midnight Monday   Take these medicines the morning of surgery with A SIP OF WATER : Levothyroxine, Detrol              ( 4-5 days prior to surgery, please stop taking vitamins, herbal supplements, blood thinners, anti-inflammatories)   Do not wear jewelry, make-up or nail polish.  Do not wear lotions, powders, or perfumes.        Do not bring valuables to the hospital.  [facility] is not responsible for any belongings or valuables.  Contacts, dentures or bridgework may not be worn into surgery.  Leave your suitcase in the car.  After surgery it may be brought to your room.  For patients admitted to the hospital, discharge time will be determined by your treatment team.  Patients discharged the day of surgery will not be allowed to drive home.   Name and phone number of your driver:     Please read over the following fact sheets that you were given. Pain Booklet, Coughing and Deep Breathing, MRSA Information and Surgical Site Infection Prevention

## 2015-05-02 ENCOUNTER — Ambulatory Visit (INDEPENDENT_AMBULATORY_CARE_PROVIDER_SITE_OTHER): Payer: Medicare Other | Admitting: Podiatry

## 2015-05-02 ENCOUNTER — Encounter: Payer: Self-pay | Admitting: Podiatry

## 2015-05-02 DIAGNOSIS — I872 Venous insufficiency (chronic) (peripheral): Secondary | ICD-10-CM | POA: Diagnosis not present

## 2015-05-02 DIAGNOSIS — M79676 Pain in unspecified toe(s): Secondary | ICD-10-CM | POA: Diagnosis not present

## 2015-05-02 DIAGNOSIS — B351 Tinea unguium: Secondary | ICD-10-CM | POA: Diagnosis not present

## 2015-05-02 DIAGNOSIS — M7989 Other specified soft tissue disorders: Secondary | ICD-10-CM | POA: Diagnosis not present

## 2015-05-02 NOTE — Progress Notes (Signed)
Patient ID: Jessica Hawkins, female   DOB: [birth year], 82 y.o.   MRN: [id number]   Subjective:  This patient presents again today complaining of thick and long toenails are strong comfortable walking wearing shoes and requests nail debridement. The last visit for this similar service was on 03/07/2014 Patient's son present in treatment room today. Patient's son wanted specific instructions for her mother's foot care with emphasis of drying after showering and between your toes  Objective: Pitting edema bilaterally The toenails are extremely elongated, brittle, deformed, discolored and tender to palpation 6-10 No open skin lesions bilaterally  Assessment: Type II diabetic with a history of peripheral arterial disease and peripheral neuropathy Symptomatic onychomycoses 6-10  Plan: Debridement toenails 6-10 mechanically and electrically without any bleeding  Gen. information about diabetic foot care provided in the after visit summary with mention of repetitive debridement for fungal toenails and the importance of drying in between your toes after showering or bathing.  Return intervals recommended at 3 months

## 2015-05-02 NOTE — Patient Instructions (Signed)
Diabetes and Foot Care Diabetes may cause you to have problems because of poor blood supply (circulation) to your feet and legs. This may cause the skin on your feet to become thinner, break easier, and heal more slowly. Your skin may become dry, and the skin may peel and crack. You may also have nerve damage in your legs and feet causing decreased feeling in them. You may not notice minor injuries to your feet that could lead to infections or more serious problems. Taking care of your feet is one of the most important things you can do for yourself.  HOME CARE INSTRUCTIONS  Wear shoes at all times, even in the house. Do not go barefoot. Bare feet are easily injured.  Check your feet daily for blisters, cuts, and redness. If you cannot see the bottom of your feet, use a mirror or ask someone for help.  Wash your feet with warm water (do not use hot water) and mild soap. Then pat your feet and the areas between your toes until they are completely dry. Do not soak your feet as this can dry your skin.  Apply a moisturizing lotion or petroleum jelly (that does not contain alcohol and is unscented) to the skin on your feet and to dry, brittle toenails. Do not apply lotion between your toes.  Trim your toenails straight across. Do not dig under them or around the cuticle. File the edges of your nails with an emery board or nail file.  Do not cut corns or calluses or try to remove them with medicine.  Wear clean socks or stockings every day. Make sure they are not too tight. Do not wear knee-high stockings since they may decrease blood flow to your legs.  Wear shoes that fit properly and have enough cushioning. To break in new shoes, wear them for just a few hours a day. This prevents you from injuring your feet. Always look in your shoes before you put them on to be sure there are no objects inside.  Do not cross your legs. This may decrease the blood flow to your feet.  If you find a minor scrape,  cut, or break in the skin on your feet, keep it and the skin around it clean and dry. These areas may be cleansed with mild soap and water. Do not cleanse the area with peroxide, alcohol, or iodine.  When you remove an adhesive bandage, be sure not to damage the skin around it.  If you have a wound, look at it several times a day to make sure it is healing.  Do not use heating pads or hot water bottles. They may burn your skin. If you have lost feeling in your feet or legs, you may not know it is happening until it is too late.  Make sure your health care provider performs a complete foot exam at least annually or more often if you have foot problems. Report any cuts, sores, or bruises to your health care provider immediately. SEEK MEDICAL CARE IF:   You have an injury that is not healing.  You have cuts or breaks in the skin.  You have an ingrown nail.  You notice redness on your legs or feet.  You feel burning or tingling in your legs or feet.  You have pain or cramps in your legs and feet.  Your legs or feet are numb.  Your feet always feel cold. SEEK IMMEDIATE MEDICAL CARE IF:   There is increasing redness,   swelling, or pain in or around a wound.  There is a red line that goes up your leg.  Pus is coming from a wound.  You develop a fever or as directed by your health care provider.  You notice a bad smell coming from an ulcer or wound.   This information is not intended to replace advice given to you by your health care provider. Make sure you discuss any questions you have with your health care provider.   Document Released: 02/16/2000 Document Revised: 10/21/2012 Document Reviewed: 07/28/2012 Elsevier Interactive Patient Education 2016 Elsevier Inc.  

## 2015-05-03 ENCOUNTER — Ambulatory Visit: Payer: Medicare Other | Admitting: Podiatry

## 2015-05-03 DIAGNOSIS — M170 Bilateral primary osteoarthritis of knee: Secondary | ICD-10-CM | POA: Diagnosis not present

## 2015-05-03 DIAGNOSIS — Z969 Presence of functional implant, unspecified: Secondary | ICD-10-CM | POA: Diagnosis not present

## 2015-05-04 ENCOUNTER — Telehealth: Payer: Self-pay | Admitting: *Deleted

## 2015-05-04 DIAGNOSIS — M170 Bilateral primary osteoarthritis of knee: Secondary | ICD-10-CM | POA: Diagnosis not present

## 2015-05-04 DIAGNOSIS — M1990 Unspecified osteoarthritis, unspecified site: Secondary | ICD-10-CM | POA: Insufficient documentation

## 2015-05-04 NOTE — Telephone Encounter (Signed)
Patient daughter, Windell Moulding dropped off Surgery Clearance Form from [facility] Dannielle Huh [phone] Given to Dr. Chilton Si to review and sign. To be faxed back to Fax: [phone]. Call Daughter when finished Tamiya Colello #[phone]

## 2015-05-09 ENCOUNTER — Ambulatory Visit: Payer: Medicare Other | Admitting: Podiatry

## 2015-05-09 ENCOUNTER — Inpatient Hospital Stay (HOSPITAL_COMMUNITY): Admission: RE | Admit: 2015-05-09 | Payer: Medicare Other | Source: Ambulatory Visit | Admitting: Orthopedic Surgery

## 2015-05-09 ENCOUNTER — Encounter (HOSPITAL_COMMUNITY): Admission: RE | Payer: Self-pay | Source: Ambulatory Visit

## 2015-05-09 SURGERY — ARTHROPLASTY, KNEE, TOTAL
Anesthesia: General | Site: Knee | Laterality: Left

## 2015-05-09 NOTE — Telephone Encounter (Signed)
Crist FatFern, daughter called back today and stated that the surgeon wants a letter from patient's Primary clearing her for surgery. Also daughter wants your opinion about Dr. Darcella CheshireSteven Lucey. Please Advise.

## 2015-05-10 DIAGNOSIS — R198 Other specified symptoms and signs involving the digestive system and abdomen: Secondary | ICD-10-CM | POA: Diagnosis not present

## 2015-05-13 ENCOUNTER — Other Ambulatory Visit: Payer: Self-pay | Admitting: Orthopedic Surgery

## 2015-05-16 ENCOUNTER — Telehealth: Payer: Self-pay

## 2015-05-16 DIAGNOSIS — R194 Change in bowel habit: Secondary | ICD-10-CM | POA: Diagnosis not present

## 2015-05-16 NOTE — Telephone Encounter (Signed)
Jessica Hawkins would like an order per the patient's family request for patient to receive ibuprofen as needed prior to water aerobics. Please advise
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
## 2015-05-17 MED ORDER — IBUPROFEN 200 MG PO CAPS: ORAL_CAPSULE | ORAL | Status: DC

## 2015-05-17 NOTE — Telephone Encounter (Signed)
This was done last week  

## 2015-05-17 NOTE — Telephone Encounter (Signed)
[facility]

## 2015-05-17 NOTE — Telephone Encounter (Signed)
Ibuprofen 200 mg. Take one tablet prior to water aerobics.

## 2015-05-19 ENCOUNTER — Encounter (HOSPITAL_COMMUNITY)
Admission: RE | Admit: 2015-05-19 | Discharge: 2015-05-19 | Disposition: A | Discharge status: Home / Self Care | Payer: Medicare Other | Source: Ambulatory Visit | Attending: Orthopedic Surgery | Admitting: Orthopedic Surgery

## 2015-05-19 ENCOUNTER — Encounter (HOSPITAL_COMMUNITY): Payer: Self-pay

## 2015-05-19 ENCOUNTER — Ambulatory Visit (HOSPITAL_COMMUNITY)
Admission: RE | Admit: 2015-05-19 | Discharge: 2015-05-19 | Disposition: A | Discharge status: Home / Self Care | Payer: Medicare Other | Source: Ambulatory Visit | Attending: Orthopedic Surgery | Admitting: Orthopedic Surgery

## 2015-05-19 ENCOUNTER — Telehealth: Payer: Self-pay | Admitting: *Deleted

## 2015-05-19 DIAGNOSIS — K449 Diaphragmatic hernia without obstruction or gangrene: Secondary | ICD-10-CM | POA: Insufficient documentation

## 2015-05-19 DIAGNOSIS — M25562 Pain in left knee: Secondary | ICD-10-CM | POA: Diagnosis not present

## 2015-05-19 DIAGNOSIS — Z01812 Encounter for preprocedural laboratory examination: Secondary | ICD-10-CM | POA: Diagnosis not present

## 2015-05-19 DIAGNOSIS — I4510 Unspecified right bundle-branch block: Secondary | ICD-10-CM | POA: Insufficient documentation

## 2015-05-19 DIAGNOSIS — Z01818 Encounter for other preprocedural examination: Secondary | ICD-10-CM | POA: Insufficient documentation

## 2015-05-19 DIAGNOSIS — M25462 Effusion, left knee: Secondary | ICD-10-CM | POA: Diagnosis not present

## 2015-05-19 DIAGNOSIS — M170 Bilateral primary osteoarthritis of knee: Secondary | ICD-10-CM | POA: Insufficient documentation

## 2015-05-19 DIAGNOSIS — I10 Essential (primary) hypertension: Secondary | ICD-10-CM | POA: Insufficient documentation

## 2015-05-19 DIAGNOSIS — K219 Gastro-esophageal reflux disease without esophagitis: Secondary | ICD-10-CM | POA: Insufficient documentation

## 2015-05-19 DIAGNOSIS — Z79899 Other long term (current) drug therapy: Secondary | ICD-10-CM | POA: Diagnosis not present

## 2015-05-19 DIAGNOSIS — M1990 Unspecified osteoarthritis, unspecified site: Secondary | ICD-10-CM | POA: Insufficient documentation

## 2015-05-19 DIAGNOSIS — E119 Type 2 diabetes mellitus without complications: Secondary | ICD-10-CM | POA: Insufficient documentation

## 2015-05-19 DIAGNOSIS — E785 Hyperlipidemia, unspecified: Secondary | ICD-10-CM | POA: Diagnosis not present

## 2015-05-19 DIAGNOSIS — G8929 Other chronic pain: Secondary | ICD-10-CM | POA: Diagnosis not present

## 2015-05-19 DIAGNOSIS — Z7982 Long term (current) use of aspirin: Secondary | ICD-10-CM | POA: Diagnosis not present

## 2015-05-19 DIAGNOSIS — Z0110 Encounter for examination of ears and hearing without abnormal findings: Secondary | ICD-10-CM

## 2015-05-19 HISTORY — DX: Personal history of other medical treatment: Z92.89

## 2015-05-19 HISTORY — DX: Angina pectoris, unspecified: I20.9

## 2015-05-19 LAB — SURGICAL PCR SCREEN
MRSA, PCR: NEGATIVE
STAPHYLOCOCCUS AUREUS: POSITIVE — AB

## 2015-05-19 LAB — CBC WITH DIFFERENTIAL/PLATELET
Basophils Absolute: 0 10*3/uL (ref 0.0–0.1)
Basophils Relative: 0 %
Eosinophils Absolute: 0.1 10*3/uL (ref 0.0–0.7)
Eosinophils Relative: 1 %
HCT: 37.5 % (ref 36.0–46.0)
Hemoglobin: 11.7 g/dL — ABNORMAL LOW (ref 12.0–15.0)
Lymphocytes Relative: 46 %
Lymphs Abs: 2.2 10*3/uL (ref 0.7–4.0)
MCH: 26.4 pg (ref 26.0–34.0)
MCHC: 31.2 g/dL (ref 30.0–36.0)
MCV: 84.5 fL (ref 78.0–100.0)
Monocytes Absolute: 0.4 10*3/uL (ref 0.1–1.0)
Monocytes Relative: 8 %
Neutro Abs: 2.2 10*3/uL (ref 1.7–7.7)
Neutrophils Relative %: 45 %
Platelets: 183 10*3/uL (ref 150–400)
RBC: 4.44 MIL/uL (ref 3.87–5.11)
RDW: 14.1 % (ref 11.5–15.5)
WBC: 4.8 10*3/uL (ref 4.0–10.5)

## 2015-05-19 LAB — GLUCOSE, CAPILLARY: Glucose-Capillary: 80 mg/dL (ref 65–99)

## 2015-05-19 LAB — URINALYSIS, ROUTINE W REFLEX MICROSCOPIC
Bilirubin Urine: NEGATIVE
Glucose, UA: NEGATIVE mg/dL
Ketones, ur: NEGATIVE mg/dL
Leukocytes, UA: NEGATIVE
Nitrite: NEGATIVE
Protein, ur: 30 mg/dL — AB
Specific Gravity, Urine: 1.018 (ref 1.005–1.030)
pH: 7 (ref 5.0–8.0)

## 2015-05-19 LAB — COMPREHENSIVE METABOLIC PANEL WITH GFR
ALT: 14 U/L (ref 14–54)
AST: 27 U/L (ref 15–41)
Albumin: 3.6 g/dL (ref 3.5–5.0)
Alkaline Phosphatase: 76 U/L (ref 38–126)
Anion gap: 11 (ref 5–15)
BUN: 27 mg/dL — ABNORMAL HIGH (ref 6–20)
CO2: 27 mmol/L (ref 22–32)
Calcium: 9.7 mg/dL (ref 8.9–10.3)
Chloride: 103 mmol/L (ref 101–111)
Creatinine, Ser: 0.89 mg/dL (ref 0.44–1.00)
GFR calc Af Amer: >60 mL/min
GFR calc non Af Amer: 55 mL/min — ABNORMAL LOW
Glucose, Bld: 96 mg/dL (ref 65–99)
Potassium: 4.1 mmol/L (ref 3.5–5.1)
Sodium: 141 mmol/L (ref 135–145)
Total Bilirubin: 0.3 mg/dL (ref 0.3–1.2)
Total Protein: 7.7 g/dL (ref 6.5–8.1)

## 2015-05-19 LAB — APTT: aPTT: 34 seconds (ref 24–37)

## 2015-05-19 LAB — URINE MICROSCOPIC-ADD ON

## 2015-05-19 LAB — PROTIME-INR
INR: 1.1 (ref 0.00–1.49)
PROTHROMBIN TIME: 14.4 s (ref 11.6–15.2)

## 2015-05-19 LAB — TYPE AND SCREEN
ABO/RH(D): O POS
Antibody Screen: NEGATIVE

## 2015-05-19 NOTE — Telephone Encounter (Signed)
Patient daughter, Crist FatFern called and stated that patient has an appointment on Monday with Dr. Adriana MccallumStephanie Nance at [facility] [phone] Fax: [phone] and needs a referral for insurance purpose. Referral placed.

## 2015-05-19 NOTE — Progress Notes (Signed)
Mupirocin Ointment Rx [facility] [address] for positive PCR of Staph. Left message on pt's son's voicemail Teena Dunk(Josiah Chancellor) with instructions to pick up Rx and use it 2 times a day for 5 days.

## 2015-05-20 LAB — HEMOGLOBIN A1C
HEMOGLOBIN A1C: 6.1 % — AB (ref 4.8–5.6)
MEAN PLASMA GLUCOSE: 128 mg/dL

## 2015-05-21 LAB — URINE CULTURE

## 2015-05-22 ENCOUNTER — Other Ambulatory Visit: Payer: Medicare Other

## 2015-05-22 ENCOUNTER — Encounter: Payer: Self-pay | Admitting: Hematology

## 2015-05-22 ENCOUNTER — Telehealth: Payer: Self-pay | Admitting: Hematology

## 2015-05-22 ENCOUNTER — Ambulatory Visit (HOSPITAL_BASED_OUTPATIENT_CLINIC_OR_DEPARTMENT_OTHER): Payer: Medicare Other

## 2015-05-22 ENCOUNTER — Ambulatory Visit (HOSPITAL_BASED_OUTPATIENT_CLINIC_OR_DEPARTMENT_OTHER): Payer: Medicare Other | Admitting: Hematology

## 2015-05-22 VITALS — BP 110/44 | HR 67 | Temp 97.9°F | Resp 17 | Ht 62.0 in | Wt 139.2 lb

## 2015-05-22 DIAGNOSIS — R7989 Other specified abnormal findings of blood chemistry: Secondary | ICD-10-CM

## 2015-05-22 DIAGNOSIS — H903 Sensorineural hearing loss, bilateral: Secondary | ICD-10-CM | POA: Diagnosis not present

## 2015-05-22 DIAGNOSIS — D699 Hemorrhagic condition, unspecified: Secondary | ICD-10-CM

## 2015-05-22 DIAGNOSIS — D691 Qualitative platelet defects: Secondary | ICD-10-CM | POA: Diagnosis not present

## 2015-05-22 DIAGNOSIS — D689 Coagulation defect, unspecified: Secondary | ICD-10-CM | POA: Diagnosis not present

## 2015-05-22 LAB — CBC & DIFF AND RETIC
BASO%: 0.5 % (ref 0.0–2.0)
BASOS ABS: 0 10*3/uL (ref 0.0–0.1)
EOS ABS: 0.1 10*3/uL (ref 0.0–0.5)
EOS%: 1.8 % (ref 0.0–7.0)
HEMATOCRIT: 36.8 % (ref 34.8–46.6)
HEMOGLOBIN: 12 g/dL (ref 11.6–15.9)
IMMATURE RETIC FRACT: 1.6 % (ref 1.60–10.00)
LYMPH#: 1.5 10*3/uL (ref 0.9–3.3)
LYMPH%: 35.1 % (ref 14.0–49.7)
MCH: 27.3 pg (ref 25.1–34.0)
MCHC: 32.6 g/dL (ref 31.5–36.0)
MCV: 83.6 fL (ref 79.5–101.0)
MONO#: 0.3 10*3/uL (ref 0.1–0.9)
MONO%: 7.7 % (ref 0.0–14.0)
NEUT#: 2.4 10*3/uL (ref 1.5–6.5)
NEUT%: 54.9 % (ref 38.4–76.8)
PLATELETS: 193 10*3/uL (ref 145–400)
RBC: 4.4 10*6/uL (ref 3.70–5.45)
RDW: 14.1 % (ref 11.2–14.5)
RETIC %: 0.8 % (ref 0.70–2.10)
RETIC CT ABS: 35.2 10*3/uL (ref 33.70–90.70)
WBC: 4.4 10*3/uL (ref 3.9–10.3)

## 2015-05-22 LAB — COMPREHENSIVE METABOLIC PANEL
ALBUMIN: 3.6 g/dL (ref 3.5–5.0)
ALT: 13 U/L (ref 0–55)
AST: 20 U/L (ref 5–34)
Alkaline Phosphatase: 83 U/L (ref 40–150)
Anion Gap: 9 mEq/L (ref 3–11)
BILIRUBIN TOTAL: 0.36 mg/dL (ref 0.20–1.20)
BUN: 33.3 mg/dL — AB (ref 7.0–26.0)
CALCIUM: 9.8 mg/dL (ref 8.4–10.4)
CHLORIDE: 101 meq/L (ref 98–109)
CO2: 30 mEq/L — ABNORMAL HIGH (ref 22–29)
CREATININE: 1.1 mg/dL (ref 0.6–1.1)
EGFR: 53 mL/min/{1.73_m2} — ABNORMAL LOW (ref >90)
Glucose: 186 mg/dl — ABNORMAL HIGH (ref 70–140)
Potassium: 4.1 mEq/L (ref 3.5–5.1)
Sodium: 140 mEq/L (ref 136–145)
TOTAL PROTEIN: 7.8 g/dL (ref 6.4–8.3)

## 2015-05-22 LAB — CHCC SMEAR

## 2015-05-22 NOTE — Telephone Encounter (Signed)
per po fto sch pt appt-gave pt copy of avs-sent back to lab °

## 2015-05-23 ENCOUNTER — Ambulatory Visit: Payer: Medicare Other | Admitting: Internal Medicine

## 2015-05-23 LAB — PROTHROMBIN TIME (PT)
INR: 1.1 (ref 0.8–1.2)
Prothrombin Time: 10.9 s (ref 9.1–12.0)

## 2015-05-23 LAB — APTT: APTT: 27 s (ref 24–33)

## 2015-05-23 NOTE — Progress Notes (Addendum)
Spoke with patients daughter Jessica Hawkins who has expressed concerns regarding this patients contact information.  Stated that pt is in a facility [facility]) and prescription for Mupirocin was called to pt's son, who is not supposed to be part of patients contact numbers, and the pharmacy listed and now they were having difficulty with providing Mupirocin to patient at the facility since a paper prescription was not given to them. After discussing pts need for Mupirocin and our protocols she has determined that she will try to get a physical prescription from Dr Tobin ChadLucey's office and if this doesn't work she said someone would go to the facility themselves and give it. I reinforced the instructions as to how to give. Her other concern is with having contact names on patients chart that should not be there.  She would like to have only pts daughter Jessica Hawkins and herself as contact persons.  I called admitting and they told me they would not be able to change pt's contact until they arrive. Ms Jessica Hawkins was not real happy with that as she thought the driver from the facility would be the one coming with her. I gave her admittings phone number and encouraged her to try and work it out with them directly.  I also told her I would place a note on the front of the patients chart with only the appropriate contact information.  Pt's health care POA and durable POA are in chart.  Stated contact people/phone numbers Jessica Hawkins  [phone] Jessica Hawkins   [phone] I felt that she was comfortable with the conversation and conclusions made.  Jessica Hawkins([name] Kimble did not feel pts account needed to be restricted according to my nursing secretary, Jessica Hawkins, who first fielded this call.)  Spoke with patient's pre admission nurse and was told that patient came to preadmission with one of the daughters and son Jessica Hawkins and was instructed by patient, daughter and son to have the nasal swab results called to Jessica Hawkins and [facility] on [address]  which was done later by Jessica Bibleeresa Crabtree, RN when results obtained.

## 2015-05-24 ENCOUNTER — Ambulatory Visit (INDEPENDENT_AMBULATORY_CARE_PROVIDER_SITE_OTHER): Payer: Medicare Other | Admitting: Internal Medicine

## 2015-05-24 ENCOUNTER — Encounter: Payer: Self-pay | Admitting: Internal Medicine

## 2015-05-24 VITALS — BP 122/64 | HR 64 | Temp 97.5°F | Resp 12 | Ht <= 58 in | Wt 142.0 lb

## 2015-05-24 DIAGNOSIS — E039 Hypothyroidism, unspecified: Secondary | ICD-10-CM

## 2015-05-24 DIAGNOSIS — E785 Hyperlipidemia, unspecified: Secondary | ICD-10-CM | POA: Diagnosis not present

## 2015-05-24 DIAGNOSIS — R269 Unspecified abnormalities of gait and mobility: Secondary | ICD-10-CM | POA: Diagnosis not present

## 2015-05-24 DIAGNOSIS — I10 Essential (primary) hypertension: Secondary | ICD-10-CM | POA: Diagnosis not present

## 2015-05-24 DIAGNOSIS — E1151 Type 2 diabetes mellitus with diabetic peripheral angiopathy without gangrene: Secondary | ICD-10-CM

## 2015-05-24 LAB — VON WILLEBRAND PANEL
FACTOR VIII ACTIVITY: 187 % — AB (ref 57–163)
PDF Image: 0
VON WILLEBRAND AG: 199 % (ref 50–200)
VON WILLEBRAND FACTOR: 143 % (ref 50–200)
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
## 2015-05-24 NOTE — Progress Notes (Signed)
Patient ID: Jessica Hawkins, female   DOB: Apr 30, 1922, 82 y.o.   MRN: [id number]   Location[facility]  [facility]  Provider: Murray Hodgkins, M.D.  Code Status: Full code Goals of Care:  Advanced Directives 05/24/2015  Does patient have an advance directive? Yes  Type of Advance Directive Healthcare Power of Attorney  Does patient want to make changes to advanced directive? No - Patient declined  Copy of advanced directive(s) in chart? Yes     Chief Complaint  Patient presents with  . Medical Management of Chronic Issues    3 month follow-up, examine ears (? clogged). Discuss testing done by hematologist. Here with Rinaldo Cloud (daughter)   . FYI    Pending surgery for Left knee on Monday   . Itching    Vaginal itching off/on- ? if there is a topically medication patient can have on hand when needed.     HPI: Patient is a 82 y.o. female seen today for medical management of chronic diseases.    Patient is having surgery 05/29/2015 on her left knee for degenerative arthritis and pain. Dr. Sherlean Foot will be performing the surgery  Since I last saw this patient, she said appointment with hematology oncology [facility] for evaluation for possible bleeding disorder. It appears that she does not have a bleeding disorder was identifiable at this time.  Blood sugar, nonfasting, done on the day of her appointment in hematology oncology was high at 186, but recent values fasting in the mornings have been 80 and 99 mg percent.  Patient is in good spirits.  Edema has improved substantially. She is using compression blames connected to an air compressor in order to bring the size of the legs down.  She is staying at an assisted-living facility at this time in order to prevent further deterioration prior to surgery.  She plans to go to skilled nursing facility, [facility], following her surgery next week.   Past Medical History  Diagnosis Date  . Hypertension   . Osteoporosis   . GERD (gastroesophageal reflux  disease) 06/29/2011  . Hypothyroid 06/29/2011  . HTN (hypertension) 06/29/2011  . Platelet disorder (HCC)     ? Von Willebrand; abnormal PLT function assay 07/04/11   . Hyperlipidemia   . Diabetes mellitus without complication (HCC)   . Edema   . BBB (bundle branch block)     RT  . Hiatal hernia   . Fibrocystic breast   . Arthritis     Osteoarthritis  . Vertigo   . Insomnia   . Gait disorder   . Unspecified constipation   . Disturbance of skin sensation   . Unspecified vitamin D deficiency   . Unspecified urinary incontinence   . Unspecified sleep apnea   . Coronary atherosclerosis of unspecified type of vessel, native or graft   . Diaphragmatic hernia without mention of obstruction or gangrene   . Debility, unspecified   . History of transfusion of packed red blood cells   . Anginal pain [facility])     'i haven't had them in a long time but i used to'    Past Surgical History  Procedure Laterality Date  . Hip surgery Right 2002    ORIF  . Thyroidectomy  1952  . Tonsillectomy    . Femur im nail  06/29/2011    Procedure: INTRAMEDULLARY (IM) NAIL FEMORAL;  Surgeon: Verlee Rossetti, MD;  Location: [facility] OR;  Service: Orthopedics;  Laterality: Left;  . Rotator cuff repair Left 1999  Tear  . Eye surgery Right 2005    Cataract surgery  . Eye surgery Left 2006    Cataract surgery    No Known Allergies    Medication List       This list is accurate as of: 05/24/15  3:16 PM.  Always use your most recent med list.               AMBULATORY NON FORMULARY MEDICATION  Knee High Compression Hose with Zippers for both legs. Diagnosis M25.561, R60.9, I87.2     aspirin 81 MG tablet  Take 81 mg by mouth daily.     BAYER MICROLET LANCETS lancets  Use as instructed to check blood sugar once daily.250.00     CALCIUM CARBONATE PO  Take 1 tablet by mouth daily.     furosemide 40 MG tablet  Commonly known as:  LASIX  Take one tablet by mouth once daily for edema     glucose blood  test strip  Test blood sugar once daily     glucose blood test strip  Commonly known as:  BAYER CONTOUR TEST  Use as instructed to check blood sugar once daily. 250.00     levothyroxine 25 MCG tablet  Commonly known as:  SYNTHROID, LEVOTHROID  Take one tablet by mouth once daily for thyroid supplement     losartan 50 MG tablet  Commonly known as:  COZAAR  Take one tablet by mouth once daily to control blood pressure     multivitamin-iron-minerals-folic acid chewable tablet  Chew 1 tablet by mouth daily.     mupirocin ointment 2 %  Commonly known as:  BACTROBAN  Apply inside each nostril twice a day for 5 days     tolterodine 4 MG 24 hr capsule  Commonly known as:  DETROL LA  Take one capsule by mouth once daily for bladder control        Review of Systems:  Review of Systems  Constitutional: Negative for fever, chills, activity change, appetite change, fatigue and unexpected weight change.  HENT: Negative.   Eyes: Negative.   Respiratory: Negative.  Negative for cough and shortness of breath.   Cardiovascular: Positive for leg swelling. Negative for chest pain and palpitations.  Gastrointestinal: Positive for constipation. Negative for vomiting, diarrhea and abdominal distention.       Incontinent of stool and urine sometimes  Endocrine: Negative for cold intolerance, heat intolerance, polydipsia, polyphagia and polyuria.  Genitourinary:       Incontinent. Vaginal itching and perirectal itching without discharge. No blood in the stool. No fever.  Musculoskeletal: Positive for myalgias, back pain, joint swelling, arthralgias and gait problem.       Right shoulder pain. Right knee pain.  Skin: Negative.   Neurological: Negative.   Hematological: Negative.   Psychiatric/Behavioral: Positive for decreased concentration.    Health Maintenance  Topic Date Due  . FOOT EXAM  08/19/2014  . OPHTHALMOLOGY EXAM  12/03/2014  . INFLUENZA VACCINE  10/03/2015  . HEMOGLOBIN A1C   11/19/2015  . TETANUS/TDAP  03/12/2021  . DEXA SCAN  Completed  . ZOSTAVAX  Completed  . PNA vac Low Risk Adult  Completed    Physical Exam: Filed Vitals:   05/24/15 1419  BP: 122/64  Pulse: 64  Temp: 97.5 F (36.4 C)  TempSrc: Oral  Resp: 12  Height: 4\' 8"  (1.422 m)  Weight: 142 lb (64.411 kg)  SpO2: 97%   Body mass index is 31.85 kg/(m^2). Physical Exam  Constitutional: She is oriented to person, place, and time. She appears well-developed and well-nourished. No distress.  frail  HENT:  Head: Normocephalic and atraumatic.  Left Ear: External ear normal.  Nose: Nose normal.  Bilateral wax build up.   Eyes: Conjunctivae and EOM are normal. Pupils are equal, round, and reactive to light.  Neck: Normal range of motion. Neck supple. No JVD present. No tracheal deviation present. No thyromegaly present.  Cardiovascular: Normal rate, regular rhythm, normal heart sounds and intact distal pulses.  Exam reveals no gallop and no friction rub.   No murmur heard. Varicose veins.  Pulmonary/Chest: Effort normal and breath sounds normal. No respiratory distress. She has no wheezes. She has no rales.  Abdominal: Soft. Bowel sounds are normal. She exhibits no distension and no mass. There is no tenderness.  Genitourinary:  No evidence of fungal curds. There is a nonspecific vaginal irritation. There is no discharge. There is thinning of the tissues overall. She was able to tolerate bimanual as well as speculum exam. Stool was heme negative when checked.  Musculoskeletal: She exhibits edema (1+ right and 2+ edema) and tenderness (both knees).  Poor balance. Using walker. Pain at the right shoulder and some crepitance noted with rotational movements.  Lymphadenopathy:    She has no cervical adenopathy.  Neurological: She is alert and oriented to person, place, and time. No cranial nerve deficit. Coordination normal.  Skin: No rash noted. No erythema. No pallor.  Seborrheic keratoses    Psychiatric: She has a normal mood and affect. Her behavior is normal. Thought content normal.    Labs reviewed: Basic Metabolic Panel:  Recent Labs  78/29/56 0844 12/02/14 0819 05/19/15 1555 05/22/15 1559  NA 141 140 141 140  K 4.2 4.3 4.1 4.1  CL 100 100 103  --   CO2 30*  GLUCOSE 109* 102* 96 186*  BUN 32* 39* 27* 33.3*  CREATININE 1.16* 1.00 0.89 1.1  CALCIUM 9.6 9.4 9.7 9.8  TSH  --  0.755  --   --    Liver Function Tests:  Recent Labs  12/02/14 0819 05/19/15 1555 05/22/15 1559  AST ALT ALKPHOS 83 76 83  BILITOT 0.4 0.3 0.36  PROT 7.2 7.7 7.8  ALBUMIN 4.0 3.6 3.6   No results for input(s): LIPASE, AMYLASE in the last 8760 hours. No results for input(s): AMMONIA in the last 8760 hours. CBC:  Recent Labs  05/19/15 1555 05/22/15 1559  WBC 4.8 4.4  NEUTROABS 2.2 2.4  HGB 11.7* 12.0  HCT 37.5 36.8  MCV 84.5 83.6  PLT 183 193   Lipid Panel: No results for input(s): CHOL, HDL, LDLCALC, TRIG, CHOLHDL, LDLDIRECT in the last 8760 hours. Lab Results  Component Value Date   HGBA1C 6.1* 05/19/2015    Procedures since last visit: Dg Chest 2 View  05/19/2015  CLINICAL DATA:  Preoperative evaluation for knee arthroplasty. Hypertension. EXAM: CHEST  2 VIEW COMPARISON:  May 12, 2013 FINDINGS: There is no edema or consolidation. Heart is upper normal in size with pulmonary vascularity within normal limits. There is atherosclerotic calcification in aorta with mild aortic tortuosity, stable. There is postoperative change in the left shoulder. IMPRESSION: No edema or consolidation.  Stable cardiac silhouette. Electronically Signed   By: Bretta Bang III M.D.   On: 05/19/2015 16:30    Assessment/Plan 1. Essential hypertension Controlled - Comprehensive metabolic panel; Future  2. 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 Abnormality of gait Riding wheelchair in the office today. Able to stand and move behind a walker.  4. Hyperlipidemia  5. Hypothyroidism, unspecified hypothyroidism type - TSH; Future

## 2015-05-26 MED ORDER — TRANEXAMIC ACID 1000 MG/10ML IV SOLN
1000.0000 mg | INTRAVENOUS | Status: AC
  Administered 2015-05-29: 1000 mg via INTRAVENOUS
  Filled 2015-05-26: qty 10

## 2015-05-26 MED ORDER — BUPIVACAINE LIPOSOME 1.3 % IJ SUSP
20.0000 mL | Freq: Once | INTRAMUSCULAR | Status: DC
  Filled 2015-05-26 (×2): qty 20

## 2015-05-28 MED ORDER — ACETAMINOPHEN 500 MG PO TABS
1000.0000 mg | ORAL_TABLET | Freq: Once | ORAL | Status: AC
  Administered 2015-05-29: 1000 mg via ORAL
  Filled 2015-05-28: qty 2

## 2015-05-28 MED ORDER — DEXTROSE 5 % IV SOLN
2.0000 g | INTRAVENOUS | Status: AC
  Administered 2015-05-29: 2 g via INTRAVENOUS
  Filled 2015-05-28: qty 20

## 2015-05-28 NOTE — Anesthesia Preprocedure Evaluation (Addendum)
Anesthesia Evaluation  Patient identified by MRN, date of birth, ID band Patient awake    Reviewed: Allergy & Precautions, H&P , Patient's Chart, lab work & pertinent test results  Airway Mallampati: II  TM Distance: >3 FB Neck ROM: full    Dental no notable dental hx.    Pulmonary    Pulmonary exam normal breath sounds clear to auscultation       Cardiovascular Exercise Tolerance: Good hypertension,  Rhythm:regular Rate:Normal     Neuro/Psych    GI/Hepatic   Endo/Other  diabetes  Renal/GU      Musculoskeletal   Abdominal   Peds  Hematology   Anesthesia Other Findings Hypertension      GERD (gastroesophageal reflux disease)    Hypothyroid HTN (hypertension)   Platelet disorder (HCC)    Von Willebrand Dz ruled out per Dr Candise CheKale Hyperlipidemia Diabetes mellitus without complication (HCC)   Edema BBB (bundle branch block)   Unspecified sleep apnea Coronary atherosclerosis of unspecified type    Diahragmatic hernia     Reproductive/Obstetrics                            Anesthesia Physical Anesthesia Plan  ASA: III  Anesthesia Plan: Spinal   Post-op Pain Management:    Induction:   Airway Management Planned:   Additional Equipment:   Intra-op Plan:   Post-operative Plan:   Informed Consent: I have reviewed the patients History and Physical, chart, labs and discussed the procedure including the risks, benefits and alternatives for the proposed anesthesia with the patient or authorized representative who has indicated his/her understanding and acceptance.   Dental Advisory Given  Plan Discussed with: CRNA  Anesthesia Plan Comments: (Lab work confirmed with CRNA in room. Platelets okay. Discussed spinal anesthetic, and patient consents to the procedure:  included risk of possible headache,backache, failed block, allergic reaction, and nerve injury. This patient was  asked if she had any questions or concerns before the procedure started. )        Anesthesia Quick Evaluation

## 2015-05-29 ENCOUNTER — Encounter (HOSPITAL_COMMUNITY): Payer: Self-pay | Admitting: Surgery

## 2015-05-29 ENCOUNTER — Inpatient Hospital Stay (HOSPITAL_COMMUNITY): Payer: Medicare Other | Admitting: Anesthesiology

## 2015-05-29 ENCOUNTER — Inpatient Hospital Stay (HOSPITAL_COMMUNITY)
Admission: RE | Admit: 2015-05-29 | Discharge: 2015-06-01 | DRG: 470 | Disposition: A | Discharge status: Skilled Nursing Facility | Payer: Medicare Other | Source: Ambulatory Visit | Attending: Orthopedic Surgery | Admitting: Orthopedic Surgery

## 2015-05-29 ENCOUNTER — Encounter (HOSPITAL_COMMUNITY)
Admission: RE | Disposition: A | Discharge status: Skilled Nursing Facility | Payer: Self-pay | Source: Ambulatory Visit | Attending: Orthopedic Surgery

## 2015-05-29 ENCOUNTER — Inpatient Hospital Stay (HOSPITAL_COMMUNITY): Payer: Medicare Other | Admitting: Vascular Surgery

## 2015-05-29 DIAGNOSIS — M810 Age-related osteoporosis without current pathological fracture: Secondary | ICD-10-CM | POA: Diagnosis present

## 2015-05-29 DIAGNOSIS — Z471 Aftercare following joint replacement surgery: Secondary | ICD-10-CM | POA: Diagnosis not present

## 2015-05-29 DIAGNOSIS — D62 Acute posthemorrhagic anemia: Secondary | ICD-10-CM | POA: Diagnosis not present

## 2015-05-29 DIAGNOSIS — I2510 Atherosclerotic heart disease of native coronary artery without angina pectoris: Secondary | ICD-10-CM | POA: Diagnosis present

## 2015-05-29 DIAGNOSIS — M1712 Unilateral primary osteoarthritis, left knee: Principal | ICD-10-CM | POA: Diagnosis present

## 2015-05-29 DIAGNOSIS — K219 Gastro-esophageal reflux disease without esophagitis: Secondary | ICD-10-CM | POA: Diagnosis present

## 2015-05-29 DIAGNOSIS — E039 Hypothyroidism, unspecified: Secondary | ICD-10-CM | POA: Diagnosis present

## 2015-05-29 DIAGNOSIS — D689 Coagulation defect, unspecified: Secondary | ICD-10-CM | POA: Diagnosis not present

## 2015-05-29 DIAGNOSIS — Z96652 Presence of left artificial knee joint: Secondary | ICD-10-CM | POA: Diagnosis not present

## 2015-05-29 DIAGNOSIS — M25562 Pain in left knee: Secondary | ICD-10-CM | POA: Diagnosis not present

## 2015-05-29 DIAGNOSIS — E1151 Type 2 diabetes mellitus with diabetic peripheral angiopathy without gangrene: Secondary | ICD-10-CM | POA: Diagnosis present

## 2015-05-29 DIAGNOSIS — M25569 Pain in unspecified knee: Secondary | ICD-10-CM | POA: Diagnosis not present

## 2015-05-29 DIAGNOSIS — I10 Essential (primary) hypertension: Secondary | ICD-10-CM | POA: Diagnosis present

## 2015-05-29 DIAGNOSIS — M179 Osteoarthritis of knee, unspecified: Secondary | ICD-10-CM | POA: Diagnosis not present

## 2015-05-29 DIAGNOSIS — E785 Hyperlipidemia, unspecified: Secondary | ICD-10-CM | POA: Diagnosis present

## 2015-05-29 DIAGNOSIS — K5900 Constipation, unspecified: Secondary | ICD-10-CM | POA: Diagnosis not present

## 2015-05-29 DIAGNOSIS — R262 Difficulty in walking, not elsewhere classified: Secondary | ICD-10-CM | POA: Diagnosis not present

## 2015-05-29 DIAGNOSIS — Z96659 Presence of unspecified artificial knee joint: Secondary | ICD-10-CM

## 2015-05-29 DIAGNOSIS — M6281 Muscle weakness (generalized): Secondary | ICD-10-CM | POA: Diagnosis not present

## 2015-05-29 DIAGNOSIS — R2681 Unsteadiness on feet: Secondary | ICD-10-CM | POA: Diagnosis not present

## 2015-05-29 DIAGNOSIS — E119 Type 2 diabetes mellitus without complications: Secondary | ICD-10-CM | POA: Diagnosis not present

## 2015-05-29 HISTORY — PX: TOTAL KNEE ARTHROPLASTY: SHX125

## 2015-05-29 LAB — GLUCOSE, CAPILLARY
GLUCOSE-CAPILLARY: 87 mg/dL (ref 65–99)
GLUCOSE-CAPILLARY: 115 mg/dL — AB (ref 65–99)
Glucose-Capillary: 109 mg/dL — ABNORMAL HIGH (ref 65–99)

## 2015-05-29 SURGERY — ARTHROPLASTY, KNEE, TOTAL
Anesthesia: Spinal | Laterality: Left

## 2015-05-29 MED ORDER — FUROSEMIDE 40 MG PO TABS
40.0000 mg | ORAL_TABLET | Freq: Every day | ORAL | Status: DC
  Administered 2015-05-30 – 2015-06-01 (×3): 40 mg via ORAL
  Filled 2015-05-29 (×3): qty 1

## 2015-05-29 MED ORDER — CEFAZOLIN SODIUM-DEXTROSE 2-4 GM/100ML-% IV SOLN
INTRAVENOUS | Status: AC
  Filled 2015-05-29: qty 100

## 2015-05-29 MED ORDER — TRANEXAMIC ACID 1000 MG/10ML IV SOLN
1000.0000 mg | Freq: Once | INTRAVENOUS | Status: DC
  Filled 2015-05-29: qty 10

## 2015-05-29 MED ORDER — METOCLOPRAMIDE HCL 5 MG/ML IJ SOLN: 5.0000 mg | Freq: Three times a day (TID) | INTRAMUSCULAR | Status: DC | PRN

## 2015-05-29 MED ORDER — SODIUM CHLORIDE 0.9 % IJ SOLN
INTRAMUSCULAR | Status: DC | PRN
  Administered 2015-05-29: 20 mL via INTRAVENOUS

## 2015-05-29 MED ORDER — SENNOSIDES-DOCUSATE SODIUM 8.6-50 MG PO TABS
1.0000 | ORAL_TABLET | Freq: Every evening | ORAL | Status: DC | PRN
  Administered 2015-06-01: 1 via ORAL
  Filled 2015-05-29: qty 1

## 2015-05-29 MED ORDER — RIVAROXABAN 10 MG PO TABS
10.0000 mg | ORAL_TABLET | Freq: Every day | ORAL | Status: DC
  Administered 2015-05-30 – 2015-06-01 (×3): 10 mg via ORAL
  Filled 2015-05-29 (×3): qty 1

## 2015-05-29 MED ORDER — PROPOFOL 10 MG/ML IV BOLUS
INTRAVENOUS | Status: DC | PRN
  Administered 2015-05-29: 20 mg via INTRAVENOUS
  Administered 2015-05-29: 100 mg via INTRAVENOUS

## 2015-05-29 MED ORDER — PHENOL 1.4 % MT LIQD: 1.0000 | OROMUCOSAL | Status: DC | PRN

## 2015-05-29 MED ORDER — MIDAZOLAM HCL 2 MG/2ML IJ SOLN
INTRAMUSCULAR | Status: DC
Start: 2015-05-29 — End: 2015-05-29
  Filled 2015-05-29: qty 2

## 2015-05-29 MED ORDER — SODIUM CHLORIDE 0.9 % IR SOLN
Status: DC | PRN
  Administered 2015-05-29: 1000 mL

## 2015-05-29 MED ORDER — METOCLOPRAMIDE HCL 5 MG PO TABS: 5.0000 mg | ORAL_TABLET | Freq: Three times a day (TID) | ORAL | Status: DC | PRN

## 2015-05-29 MED ORDER — ONDANSETRON HCL 4 MG/2ML IJ SOLN: 4.0000 mg | Freq: Once | INTRAMUSCULAR | Status: DC | PRN

## 2015-05-29 MED ORDER — OXYCODONE HCL 5 MG PO TABS
5.0000 mg | ORAL_TABLET | ORAL | Status: DC | PRN
  Administered 2015-05-29 – 2015-05-31 (×8): 10 mg via ORAL
  Administered 2015-05-31 – 2015-06-01 (×2): 5 mg via ORAL
  Filled 2015-05-29 (×7): qty 2
  Filled 2015-05-29 (×2): qty 1
  Filled 2015-05-29 (×2): qty 2

## 2015-05-29 MED ORDER — LOSARTAN POTASSIUM 50 MG PO TABS
50.0000 mg | ORAL_TABLET | Freq: Every day | ORAL | Status: DC
Start: 2015-05-29 — End: 2015-06-01
  Administered 2015-05-29 – 2015-06-01 (×4): 50 mg via ORAL
  Filled 2015-05-29 (×4): qty 1

## 2015-05-29 MED ORDER — CHLORHEXIDINE GLUCONATE 4 % EX LIQD: 60.0000 mL | Freq: Once | CUTANEOUS | Status: DC

## 2015-05-29 MED ORDER — METHOCARBAMOL 500 MG PO TABS: 500.0000 mg | ORAL_TABLET | Freq: Four times a day (QID) | ORAL | Status: DC | PRN

## 2015-05-29 MED ORDER — ACETAMINOPHEN 650 MG RE SUPP: 650.0000 mg | Freq: Four times a day (QID) | RECTAL | Status: DC | PRN

## 2015-05-29 MED ORDER — LIDOCAINE HCL (CARDIAC) 20 MG/ML IV SOLN
INTRAVENOUS | Status: DC | PRN
  Administered 2015-05-29: 60 mg via INTRAVENOUS

## 2015-05-29 MED ORDER — FENTANYL CITRATE (PF) 100 MCG/2ML IJ SOLN: 25.0000 ug | INTRAMUSCULAR | Status: DC | PRN

## 2015-05-29 MED ORDER — DOCUSATE SODIUM 100 MG PO CAPS
100.0000 mg | ORAL_CAPSULE | Freq: Two times a day (BID) | ORAL | Status: DC
  Administered 2015-05-29 – 2015-06-01 (×6): 100 mg via ORAL
  Filled 2015-05-29 (×6): qty 1

## 2015-05-29 MED ORDER — ALUM & MAG HYDROXIDE-SIMETH 200-200-20 MG/5ML PO SUSP: 30.0000 mL | ORAL | Status: DC | PRN

## 2015-05-29 MED ORDER — BUPIVACAINE-EPINEPHRINE (PF) 0.25% -1:200000 IJ SOLN
INTRAMUSCULAR | Status: DC | PRN
  Administered 2015-05-29: 30 mL

## 2015-05-29 MED ORDER — FLEET ENEMA 7-19 GM/118ML RE ENEM
1.0000 | ENEMA | Freq: Once | RECTAL | Status: AC | PRN
  Administered 2015-06-01: 1 via RECTAL
  Filled 2015-05-29: qty 1

## 2015-05-29 MED ORDER — HYDROMORPHONE HCL 1 MG/ML IJ SOLN
0.5000 mg | INTRAMUSCULAR | Status: DC | PRN
  Administered 2015-05-30: 0.5 mg via INTRAVENOUS
  Filled 2015-05-29: qty 1

## 2015-05-29 MED ORDER — BUPIVACAINE LIPOSOME 1.3 % IJ SUSP
INTRAMUSCULAR | Status: DC | PRN
  Administered 2015-05-29: 20 mL

## 2015-05-29 MED ORDER — MENTHOL 3 MG MT LOZG: 1.0000 | LOZENGE | OROMUCOSAL | Status: DC | PRN

## 2015-05-29 MED ORDER — DIPHENHYDRAMINE HCL 12.5 MG/5ML PO ELIX
12.5000 mg | ORAL_SOLUTION | ORAL | Status: DC | PRN
  Administered 2015-06-01: 25 mg via ORAL
  Filled 2015-05-29: qty 10

## 2015-05-29 MED ORDER — ONDANSETRON HCL 4 MG/2ML IJ SOLN: 4.0000 mg | Freq: Four times a day (QID) | INTRAMUSCULAR | Status: DC | PRN

## 2015-05-29 MED ORDER — LACTATED RINGERS IV SOLN
INTRAVENOUS | Status: DC
  Administered 2015-05-29 (×3): via INTRAVENOUS

## 2015-05-29 MED ORDER — SODIUM CHLORIDE 0.9 % IV SOLN: INTRAVENOUS | Status: DC

## 2015-05-29 MED ORDER — FENTANYL CITRATE (PF) 100 MCG/2ML IJ SOLN
INTRAMUSCULAR | Status: AC
  Filled 2015-05-29: qty 2

## 2015-05-29 MED ORDER — ONDANSETRON HCL 4 MG/2ML IJ SOLN
INTRAMUSCULAR | Status: DC | PRN
  Administered 2015-05-29: 4 mg via INTRAVENOUS

## 2015-05-29 MED ORDER — CELECOXIB 200 MG PO CAPS
200.0000 mg | ORAL_CAPSULE | Freq: Two times a day (BID) | ORAL | Status: DC
  Administered 2015-05-29 – 2015-06-01 (×6): 200 mg via ORAL
  Filled 2015-05-29 (×6): qty 1

## 2015-05-29 MED ORDER — BUPIVACAINE-EPINEPHRINE (PF) 0.25% -1:200000 IJ SOLN
INTRAMUSCULAR | Status: AC
  Filled 2015-05-29: qty 30

## 2015-05-29 MED ORDER — LEVOTHYROXINE SODIUM 25 MCG PO TABS
25.0000 ug | ORAL_TABLET | Freq: Every day | ORAL | Status: DC
  Administered 2015-05-30 – 2015-06-01 (×3): 25 ug via ORAL
  Filled 2015-05-29 (×3): qty 1

## 2015-05-29 MED ORDER — SODIUM CHLORIDE 0.9 % IV SOLN
INTRAVENOUS | Status: DC
  Administered 2015-05-30: 19:00:00 via INTRAVENOUS

## 2015-05-29 MED ORDER — OXYCODONE HCL ER 10 MG PO T12A
10.0000 mg | EXTENDED_RELEASE_TABLET | Freq: Two times a day (BID) | ORAL | Status: DC
  Administered 2015-05-29 – 2015-06-01 (×6): 10 mg via ORAL
  Filled 2015-05-29 (×6): qty 1

## 2015-05-29 MED ORDER — FENTANYL CITRATE (PF) 250 MCG/5ML IJ SOLN
INTRAMUSCULAR | Status: DC | PRN
  Administered 2015-05-29 (×3): 25 ug via INTRAVENOUS
  Administered 2015-05-29: 50 ug via INTRAVENOUS

## 2015-05-29 MED ORDER — PHENYLEPHRINE HCL 10 MG/ML IJ SOLN
INTRAMUSCULAR | Status: DC | PRN
  Administered 2015-05-29: 80 ug via INTRAVENOUS

## 2015-05-29 MED ORDER — ZOLPIDEM TARTRATE 5 MG PO TABS: 5.0000 mg | ORAL_TABLET | Freq: Every evening | ORAL | Status: DC | PRN

## 2015-05-29 MED ORDER — BISACODYL 5 MG PO TBEC
5.0000 mg | DELAYED_RELEASE_TABLET | Freq: Every day | ORAL | Status: DC | PRN
  Administered 2015-05-31: 5 mg via ORAL
  Filled 2015-05-29: qty 1

## 2015-05-29 MED ORDER — MEPERIDINE HCL 25 MG/ML IJ SOLN
6.2500 mg | INTRAMUSCULAR | Status: DC | PRN
Start: 2015-05-29 — End: 2015-05-29

## 2015-05-29 MED ORDER — PROPOFOL 500 MG/50ML IV EMUL
INTRAVENOUS | Status: DC | PRN
Start: 2015-05-29 — End: 2015-05-29
  Administered 2015-05-29: 50 ug/kg/min via INTRAVENOUS

## 2015-05-29 MED ORDER — FENTANYL CITRATE (PF) 250 MCG/5ML IJ SOLN
INTRAMUSCULAR | Status: AC
  Filled 2015-05-29: qty 5

## 2015-05-29 MED ORDER — ACETAMINOPHEN 325 MG PO TABS
650.0000 mg | ORAL_TABLET | Freq: Four times a day (QID) | ORAL | Status: DC | PRN
Start: 2015-05-29 — End: 2015-06-01
  Administered 2015-05-31: 650 mg via ORAL
  Filled 2015-05-29: qty 2

## 2015-05-29 MED ORDER — ONDANSETRON HCL 4 MG PO TABS: 4.0000 mg | ORAL_TABLET | Freq: Four times a day (QID) | ORAL | Status: DC | PRN

## 2015-05-29 MED ORDER — METHOCARBAMOL 1000 MG/10ML IJ SOLN
500.0000 mg | Freq: Four times a day (QID) | INTRAVENOUS | Status: DC | PRN
  Filled 2015-05-29: qty 5

## 2015-05-29 MED ORDER — FESOTERODINE FUMARATE ER 4 MG PO TB24
4.0000 mg | ORAL_TABLET | Freq: Every day | ORAL | Status: DC
  Administered 2015-05-30 – 2015-06-01 (×3): 4 mg via ORAL
  Filled 2015-05-29 (×6): qty 1

## 2015-05-29 SURGICAL SUPPLY — 59 items
BANDAGE ACE 6X5 VEL STRL LF (GAUZE/BANDAGES/DRESSINGS) ×1 IMPLANT
BANDAGE ESMARK 6X9 LF (GAUZE/BANDAGES/DRESSINGS) ×1 IMPLANT
BLADE SAGITTAL 13X1.27X60 (BLADE) ×2 IMPLANT
BLADE SAW SGTL 83.5X18.5 (BLADE) ×2 IMPLANT
BLADE SURG 10 STRL SS (BLADE) ×2 IMPLANT
BNDG CMPR 9X6 STRL LF SNTH (GAUZE/BANDAGES/DRESSINGS) ×1
BNDG ESMARK 6X9 LF (GAUZE/BANDAGES/DRESSINGS) ×2
BOWL SMART MIX CTS (DISPOSABLE) ×2 IMPLANT
CAPT KNEE TOTAL 3 ×2 IMPLANT
CEMENT BONE SIMPLEX SPEEDSET (Cement) ×4 IMPLANT
COVER SURGICAL LIGHT HANDLE (MISCELLANEOUS) ×2 IMPLANT
CUFF TOURNIQUET SINGLE 34IN LL (TOURNIQUET CUFF) ×2 IMPLANT
DRAPE EXTREMITY T 121X128X90 (DRAPE) ×2 IMPLANT
DRAPE INCISE IOBAN 66X45 STRL (DRAPES) ×4 IMPLANT
DRAPE PROXIMA HALF (DRAPES) IMPLANT
DRAPE U-SHAPE 47X51 STRL (DRAPES) ×2 IMPLANT
DRSG ADAPTIC 3X8 NADH LF (GAUZE/BANDAGES/DRESSINGS) ×2 IMPLANT
DRSG PAD ABDOMINAL 8X10 ST (GAUZE/BANDAGES/DRESSINGS) ×2 IMPLANT
DURAPREP 26ML APPLICATOR (WOUND CARE) ×4 IMPLANT
ELECT REM PT RETURN 9FT ADLT (ELECTROSURGICAL) ×2
ELECTRODE REM PT RTRN 9FT ADLT (ELECTROSURGICAL) ×1 IMPLANT
GAUZE SPONGE 4X4 12PLY STRL (GAUZE/BANDAGES/DRESSINGS) ×2 IMPLANT
GLOVE BIOGEL M 7.0 STRL (GLOVE) IMPLANT
GLOVE BIOGEL PI IND STRL 7.5 (GLOVE) IMPLANT
GLOVE BIOGEL PI IND STRL 8.5 (GLOVE) ×5 IMPLANT
GLOVE BIOGEL PI INDICATOR 7.5 (GLOVE)
GLOVE BIOGEL PI INDICATOR 8.5 (GLOVE) ×5
GLOVE SURG ORTHO 8.0 STRL STRW (GLOVE) ×12 IMPLANT
GOWN STRL REUS W/ TWL LRG LVL3 (GOWN DISPOSABLE) ×1 IMPLANT
GOWN STRL REUS W/ TWL XL LVL3 (GOWN DISPOSABLE) ×2 IMPLANT
GOWN STRL REUS W/TWL 2XL LVL3 (GOWN DISPOSABLE) ×2 IMPLANT
GOWN STRL REUS W/TWL LRG LVL3 (GOWN DISPOSABLE) ×2
GOWN STRL REUS W/TWL XL LVL3 (GOWN DISPOSABLE) ×4
HANDPIECE INTERPULSE COAX TIP (DISPOSABLE) ×2
HOOD PEEL AWAY FACE SHEILD DIS (HOOD) ×6 IMPLANT
KIT BASIN OR (CUSTOM PROCEDURE TRAY) ×2 IMPLANT
KIT ROOM TURNOVER OR (KITS) ×2 IMPLANT
KNEE CAPITATED TOTAL 3 IMPLANT
MANIFOLD NEPTUNE II (INSTRUMENTS) ×2 IMPLANT
NEEDLE 22X1 1/2 (OR ONLY) (NEEDLE) ×4 IMPLANT
NS IRRIG 1000ML POUR BTL (IV SOLUTION) ×2 IMPLANT
PACK TOTAL JOINT (CUSTOM PROCEDURE TRAY) ×2 IMPLANT
PACK UNIVERSAL I (CUSTOM PROCEDURE TRAY) ×2 IMPLANT
PAD ARMBOARD 7.5X6 YLW CONV (MISCELLANEOUS) ×4 IMPLANT
PADDING CAST COTTON 6X4 STRL (CAST SUPPLIES) ×2 IMPLANT
SET HNDPC FAN SPRY TIP SCT (DISPOSABLE) ×1 IMPLANT
STAPLER VISISTAT 35W (STAPLE) ×2 IMPLANT
SUCTION FRAZIER HANDLE 10FR (MISCELLANEOUS) ×1
SUCTION TUBE FRAZIER 10FR DISP (MISCELLANEOUS) ×1 IMPLANT
SUT BONE WAX W31G (SUTURE) ×2 IMPLANT
SUT VIC AB 0 CTB1 27 (SUTURE) ×4 IMPLANT
SUT VIC AB 1 CT1 27 (SUTURE) ×4
SUT VIC AB 1 CT1 27XBRD ANBCTR (SUTURE) ×2 IMPLANT
SUT VIC AB 2-0 CT1 27 (SUTURE) ×4
SUT VIC AB 2-0 CT1 TAPERPNT 27 (SUTURE) ×2 IMPLANT
SYR 20CC LL (SYRINGE) ×4 IMPLANT
TOWEL OR 17X24 6PK STRL BLUE (TOWEL DISPOSABLE) ×2 IMPLANT
TOWEL OR 17X26 10 PK STRL BLUE (TOWEL DISPOSABLE) ×2 IMPLANT
WATER STERILE IRR 1000ML POUR (IV SOLUTION) ×4 IMPLANT

## 2015-05-29 NOTE — Anesthesia Postprocedure Evaluation (Signed)
Anesthesia Post Note  Patient: Jessica Hawkins  Procedure(s) Performed: Procedure(s) (LRB): TOTAL KNEE ARTHROPLASTY (Left)  Patient location during evaluation: PACU Anesthesia Type: Spinal Level of consciousness: awake Pain management: satisfactory to patient Vital Signs Assessment: post-procedure vital signs reviewed and stable Respiratory status: spontaneous breathing Cardiovascular status: blood pressure returned to baseline Postop Assessment: no headache and spinal receding Anesthetic complications: no    Last Vitals:  Filed Vitals:   05/29/15 1400 05/29/15 1415  BP: 170/71 180/67  Pulse: 64 68  Temp:  36.9 C  Resp: 12 9    Last Pain:  Filed Vitals:   05/29/15 1423  PainSc: Asleep                 Shikara Mcauliffe EDWARD

## 2015-05-29 NOTE — Progress Notes (Signed)
Orthopedic Tech Progress Note Patient Details:  Gerhard Munchauline G Capp [birth year] [id number] Viewed order from doctor's order list CPM Left Knee CPM Left Knee: On Left Knee Flexion (Degrees): 90 Left Knee Extension (Degrees): 0 Additional Comments: trapeze bar patient helper   Nikki DomCrawford, Allene Furuya 05/29/2015, 1:46 PM

## 2015-05-29 NOTE — H&P (Signed)
Jessica Hawkins MRN:  [id number] DOB/SEX:  1922/09/01/female  CHIEF COMPLAINT:  Painful left Knee  HISTORY: Patient is a 82 y.o. female presented with a history of pain in the left knee. Onset of symptoms was gradual starting a few years ago with gradually worsening course since that time. Patient has been treated conservatively with over-the-counter NSAIDs and activity modification. Patient currently rates pain in the knee at 10 out of 10 with activity. There is pain at night.  PAST MEDICAL HISTORY: Patient Active Problem List   Diagnosis Date Noted  . Bleeding disorder (HCC) 05/22/2015  . Nonspecific vaginitis 04/11/2015  . Recurrent UTI 03/07/2015  . Joint mouse 03/07/2015  . Right shoulder pain 01/18/2015  . Pre-operative cardiovascular examination 12/07/2014  . Loss of weight 10/13/2013  . Constipation 10/06/2013  . Venous insufficiency (chronic) (peripheral) 02/02/2013  . Knee pain 12/16/2012  . Arthritis of knee, degenerative 10/29/2012  . Edema 07/23/2012  . Abnormality of gait 07/23/2012  . Unspecified urinary incontinence 07/23/2012  . Hyperlipidemia 07/23/2012  . Controlled type 2 DM with peripheral circulatory disorder (HCC) 07/23/2012  . GERD (gastroesophageal reflux disease) 06/29/2011  . HTN (hypertension) 06/29/2011  . Intertrochanteric fracture of left femur (HCC) 06/29/2011  . Arthritis 06/29/2011  . Hypothyroid 06/29/2011   Past Medical History  Diagnosis Date  . Hypertension   . Osteoporosis   . GERD (gastroesophageal reflux disease) 06/29/2011  . Hypothyroid 06/29/2011  . HTN (hypertension) 06/29/2011  . Platelet disorder (HCC)     ? Von Willebrand; abnormal PLT function assay 07/04/11   . Hyperlipidemia   . Diabetes mellitus without complication (HCC)   . Edema   . BBB (bundle branch block)     RT  . Hiatal hernia   . Fibrocystic breast   . Arthritis     Osteoarthritis  . Vertigo   . Insomnia   . Gait disorder   . Unspecified constipation   .  Disturbance of skin sensation   . Unspecified vitamin D deficiency   . Unspecified urinary incontinence   . Unspecified sleep apnea   . Coronary atherosclerosis of unspecified type of vessel, native or graft   . Diaphragmatic hernia without mention of obstruction or gangrene   . Debility, unspecified   . History of transfusion of packed red blood cells   . Anginal pain [facility])     'i haven't had them in a long time but i used to'   Past Surgical History  Procedure Laterality Date  . Hip surgery Right 2002    ORIF  . Thyroidectomy  1952  . Tonsillectomy    . Femur im nail  06/29/2011    Procedure: INTRAMEDULLARY (IM) NAIL FEMORAL;  Surgeon: Verlee Rossetti, MD;  Location: [facility];  Service: Orthopedics;  Laterality: Left;  . Rotator cuff repair Left 1999    Tear  . Eye surgery Right 2005    Cataract surgery  . Eye surgery Left 2006    Cataract surgery     MEDICATIONS:   No prescriptions prior to admission    ALLERGIES:  No Known Allergies  REVIEW OF SYSTEMS:  A comprehensive review of systems was negative except for: Musculoskeletal: positive for arthralgias and bone pain   FAMILY HISTORY:   Family History  Problem Relation Age of Onset  . Cancer Mother     Stomach  . Cancer Father     Prostate  . Emphysema Father   . Alcohol abuse Sister   . Liver disease Sister   .  Kidney disease Brother   . Hypertension Daughter   . Cancer Brother     Prostate, Bladder  . Emphysema Brother   . Cancer Brother     Prostate  . Arthritis Daughter     Knees  . Heart murmur Son   . Mental illness Son     Autism    SOCIAL HISTORY:   Social History  Substance Use Topics  . Smoking status: Never Smoker   . Smokeless tobacco: Never Used  . Alcohol Use: No     EXAMINATION:  Vital signs in last 24 hours:    There were no vitals taken for this visit. General appearance: alert, cooperative and no distress Lungs: clear to auscultation bilaterally Heart: regular rate and  rhythm, S1, S2 normal, no murmur, click, rub or gallop Abdomen: soft, non-tender; bowel sounds normal; no masses,  no organomegaly Extremities: extremities normal, atraumatic, no cyanosis or edema Skin: Skin color, texture, turgor normal. No rashes or lesions  Musculoskeletal:  ROM 0-100, Ligaments intact,  Imaging Review Plain radiographs demonstrate severe degenerative joint disease of the left knee. The overall alignment is neutral. The bone quality appears to be good for age and reported activity level.  Assessment/Plan: Primary osteoarthritis, left knee   The patient history, physical examination and imaging studies are consistent with advanced degenerative joint disease of the left knee. The patient has failed conservative treatment.  The clearance notes were reviewed.  After discussion with the patient it was felt that Total Knee Replacement was indicated. The procedure,  risks, and benefits of total knee arthroplasty were presented and reviewed. The risks including but not limited to aseptic loosening, infection, blood clots, vascular injury, stiffness, patella tracking problems complications among others were discussed. The patient acknowledged the explanation, agreed to proceed with the plan.  Guy SandiferColby Alan Robbins 05/29/2015, 6:30 AM

## 2015-05-29 NOTE — Transfer of Care (Signed)
Immediate Anesthesia Transfer of Care Note  Patient: Jessica Hawkins  Procedure(s) Performed: Procedure(s): TOTAL KNEE ARTHROPLASTY (Left)  Patient Location: PACU  Anesthesia Type:GA combined with regional for post-op pain  Level of Consciousness: awake and alert   Airway & Oxygen Therapy: Patient Spontanous Breathing and Patient connected to nasal cannula oxygen  Post-op Assessment: Report given to RN and Post -op Vital signs reviewed and stable  Post vital signs: Reviewed and stable  Last Vitals:  Filed Vitals:   05/29/15 0827 05/29/15 1248  BP: 171/69 169/64  Pulse: 64 68  Temp: 36.4 C 36.4 C  Resp: 16 9    Complications: No apparent anesthesia complications

## 2015-05-29 NOTE — Anesthesia Procedure Notes (Addendum)
Spinal Patient location during procedure: OR Preanesthetic Checklist Completed: patient identified, site marked, surgical consent, pre-op evaluation, timeout performed, IV checked, risks and benefits discussed and monitors and equipment checked Spinal Block Patient position: sitting Prep: DuraPrep Patient monitoring: heart rate, cardiac monitor, continuous pulse ox and blood pressure Approach: midline Location: L3-4 Injection technique: single-shot Needle Needle type: Sprotte  Needle gauge: 22 G Needle length: 9 cm Additional Notes Spinal Dosage in OR  Bupivicaine ml       1.0 L L Decubitus  Procedure Name: LMA Insertion Date/Time: 05/29/2015 11:08 AM Performed by: Reine JustFLOWERS, Bevely Hackbart T Pre-anesthesia Checklist: Patient identified, Emergency Drugs available, Suction available, Patient being monitored and Timeout performed Patient Re-evaluated:Patient Re-evaluated prior to inductionOxygen Delivery Method: Circle system utilized and Simple face mask Preoxygenation: Pre-oxygenation with 100% oxygen Intubation Type: Combination inhalational/ intravenous induction Ventilation: Mask ventilation without difficulty LMA: LMA inserted LMA Size: 4.0 Number of attempts: 1 Airway Equipment and Method: Patient positioned with wedge pillow Placement Confirmation: positive ETCO2 and breath sounds checked- equal and bilateral Tube secured with: Tape Dental Injury: Teeth and Oropharynx as per pre-operative assessment

## 2015-05-30 ENCOUNTER — Encounter (HOSPITAL_COMMUNITY): Payer: Self-pay | Admitting: Orthopedic Surgery

## 2015-05-30 LAB — CBC
HCT: 31.3 % — ABNORMAL LOW (ref 36.0–46.0)
HEMOGLOBIN: 10.1 g/dL — AB (ref 12.0–15.0)
MCH: 27.2 pg (ref 26.0–34.0)
MCHC: 32.3 g/dL (ref 30.0–36.0)
MCV: 84.1 fL (ref 78.0–100.0)
PLATELETS: 150 10*3/uL (ref 150–400)
RBC: 3.72 MIL/uL — AB (ref 3.87–5.11)
RDW: 14 % (ref 11.5–15.5)
WBC: 7.4 10*3/uL (ref 4.0–10.5)

## 2015-05-30 LAB — BASIC METABOLIC PANEL
ANION GAP: 8 (ref 5–15)
BUN: 18 mg/dL (ref 4–21)
BUN: 18 mg/dL (ref 6–20)
CO2: 27 mmol/L (ref 22–32)
Calcium: 8.4 mg/dL — ABNORMAL LOW (ref 8.9–10.3)
Chloride: 100 mmol/L — ABNORMAL LOW (ref 101–111)
Creatinine, Ser: 1.07 mg/dL — ABNORMAL HIGH (ref 0.44–1.00)
Creatinine: 1.1 mg/dL (ref 0.5–1.1)
GFR, EST AFRICAN AMERICAN: 51 mL/min — AB (ref >60)
GFR, EST NON AFRICAN AMERICAN: 44 mL/min — AB (ref >60)
GLUCOSE: 151 mg/dL
Glucose, Bld: 151 mg/dL — ABNORMAL HIGH (ref 65–99)
POTASSIUM: 3.7 mmol/L (ref 3.5–5.1)
SODIUM: 135 mmol/L — AB (ref 137–147)
SODIUM: 135 mmol/L (ref 135–145)

## 2015-05-30 NOTE — Op Note (Addendum)
TOTAL KNEE REPLACEMENT OPERATIVE NOTE:  05/29/2015  8:20 AM  PATIENT:  Jessica MunchPauline G Christoffersen  82 y.o. female  PRE-OPERATIVE DIAGNOSIS:  primary osteoarthritis left knee  POST-OPERATIVE DIAGNOSIS:  primary osteoarthritis left knee  PROCEDURE:  Procedure(s): TOTAL KNEE ARTHROPLASTY  SURGEON:  Surgeon(s): Dannielle HuhSteve Naveen Lorusso, MD  PHYSICIAN ASSISTANT: Laurier Nancyolby Robbins, [facility]   ANESTHESIA:   general  DRAINS: Hemovac  SPECIMEN: None  COUNTS:  Correct  TOURNIQUET:   Total Tourniquet Time Documented: Thigh (Left) - 66 minutes Total: Thigh (Left) - 66 minutes   DICTATION:  Indication for procedure:    The patient is a 82 y.o. female who has failed conservative treatment for primary osteoarthritis left knee.  Informed consent was obtained prior to anesthesia. The risks versus benefits of the operation were explain and in a way the patient can, and did, understand.   On the implant demand matching protocol, this patient scored 10.  Therefore, this patient did" "did not receive a polyethylene insert with vitamin E which is a high demand implant.  Description of procedure:     The patient was taken to the operating room and placed under anesthesia.  The patient was positioned in the usual fashion taking care that all body parts were adequately padded and/or protected.  I foley catheter was not placed.  A tourniquet was applied and the leg prepped and draped in the usual sterile fashion.  The extremity was exsanguinated with the esmarch and tourniquet inflated to 350 mmHg.  Pre-operative range of motion was 10-105  degrees flexion.  The knee was in 8 degree of significant valgus.  A midline incision approximately 6-7 inches long was made with a #10 blade.  A new blade was used to make a parapatellar arthrotomy going 2-3 cm into the quadriceps tendon, over the patella, and alongside the medial aspect of the patellar tendon.  A synovectomy was then performed with the #10 blade and forceps. I then elevated  the deep MCL off the medial tibial metaphysis subperiosteally around to the semimembranosus attachment.    I everted the patella and used calipers to measure patellar thickness.  I used the reamer to ream down to appropriate thickness to recreate the native thickness.  I then removed excess bone with the rongeur and sagittal saw.  I used the appropriately sized template and drilled the three lug holes.  I then put the trial in place and measured the thickness with the calipers to ensure recreation of the native thickness.  The trial was then removed and the patella subluxed and the knee brought into flexion.  A homan retractor was place to retract and protect the patella and lateral structures.  A Z-retractor was place medially to protect the medial structures.  The extra-medullary alignment system was used to make cut the tibial articular surface perpendicular to the anamotic axis of the tibia and in 3 degrees of posterior slope.  The cut surface and alignment jig was removed.  I then used the intramedullary alignment guide to make a 4 valgus cut on the distal femur.  I then marked out the epicondylar axis on the distal femur.  The posterior condylar axis measured 5 degrees.  I then used the anterior referencing sizer and measured the femur to be a size 7.  The 4-In-1 cutting block was screwed into place in external rotation matching the posterior condylar angle, making our cuts perpendicular to the epicondylar axis.  Anterior, posterior and chamfer cuts were made with the sagittal saw.  The cutting  block and cut pieces were removed.  A lamina spreader was placed in 90 degrees of flexion.  The ACL, PCL, menisci, and posterior condylar osteophytes were removed.  A 11 mm spacer blocked was found to offer good flexion and extension gap balance after severe in degree releasing.   The scoop retractor was then placed and the femoral finishing block was pinned in place.  The small sagittal saw was used as well as  the lug drill to finish the femur.  The block and cut surfaces were removed and the medullary canal hole filled with autograft bone from the cut pieces.  The tibia was delivered forward in deep flexion and external rotation.  A size D tray was selected and pinned into place centered on the medial 1/3 of the tibial tubercle.  The reamer and keel was used to prepare the tibia through the tray.    I then trialed with the size 7 femur, size D tibia, a 11 mm insert and the 32 patella.  I had excellent flexion/extension gap balance, excellent patella tracking.  Flexion was full and beyond 120 degrees; extension was zero.  These components were chosen and the staff opened them to me on the back table while the knee was lavaged copiously and the cement mixed.  The soft tissue was infiltrated with 60cc of exparel 1.3% through a 21 gauge needle.  I cemented in the components and removed all excess cement.  The polyethylene tibial component was snapped into place and the knee placed in extension while cement was hardening.  The capsule was infilltrated with 30cc of .25% Marcaine with epinephrine.  A hemovac was place in the joint exiting superolaterally.  A pain pump was place superomedially superficial to the arthrotomy.  Once the cement was hard, the tourniquet was let down.  Hemostasis was obtained.  The arthrotomy was closed with figure-8 #1 vicryl sutures.  The deep soft tissues were closed with #0 vicryls and the subcuticular layer closed with a running #2-0 vicryl.  The skin was reapproximated and closed with skin staples.  The wound was dressed with xeroform, 4 x4's, 2 ABD sponges, a single layer of webril and a TED stocking.   The patient was then awakened, extubated, and taken to the recovery room in stable condition.  BLOOD LOSS:  300cc DRAINS: 1 hemovac, 1 pain catheter COMPLICATIONS:  None.  PLAN OF CARE: Admit to inpatient   PATIENT DISPOSITION:  PACU - hemodynamically stable.   Delay start of  Pharmacological VTE agent (>24hrs) due to surgical blood loss or risk of bleeding:  not applicable  Please fax a copy of this op note to my office at [phone] (please only include page 1 and 2 of the Case Information op note)

## 2015-05-30 NOTE — Progress Notes (Signed)
PT Cancellation Note  Patient Details Name: Jessica Hawkins MRN: [id number] DOB: Jan 10, 1923   Cancelled Treatment:    Reason Eval/Treat Not Completed: Other (comment) (Attempted to see pt at 1216 and 1254 but both times pt eating lunch and deferred therapy)   [name], [name] 05/30/2015, 12:57 PM [name], PT [phone]

## 2015-05-30 NOTE — NC FL2 (Signed)
[place] MEDICAID FL2 LEVEL OF CARE SCREENING TOOL     IDENTIFICATION  Patient Name: Jessica Hawkins Birthdate: 28-Sep-1922 Sex: female Admission Date (Current Location): 05/29/2015  [facility] and [place] Number:  Producer, television/film/video and Address:  [facility] [place], [address] [place], [place] [address]      Provider Number: [id number]  Attending Physician Name and Address:  Dannielle Huh, MD  Relative Name and Phone Number:       Current Level of Care: Hospital Recommended Level of Care: Skilled Nursing Facility Prior Approval Number:    Date Approved/Denied:   PASRR Number: [id number]  Discharge Plan: SNF    Current Diagnoses: Patient Active Problem List   Diagnosis Date Noted  . S/P total knee arthroplasty 05/29/2015  . Bleeding disorder (HCC) 05/22/2015  . Nonspecific vaginitis 04/11/2015  . Recurrent UTI 03/07/2015  . Joint mouse 03/07/2015  . Right shoulder pain 01/18/2015  . Pre-operative cardiovascular examination 12/07/2014  . Loss of weight 10/13/2013  . Constipation 10/06/2013  . Venous insufficiency (chronic) (peripheral) 02/02/2013  . Knee pain 12/16/2012  . Arthritis of knee, degenerative 10/29/2012  . Edema 07/23/2012  . Abnormality of gait 07/23/2012  . Unspecified urinary incontinence 07/23/2012  . Hyperlipidemia 07/23/2012  . Controlled type 2 DM with peripheral circulatory disorder (HCC) 07/23/2012  . GERD (gastroesophageal reflux disease) 06/29/2011  . HTN (hypertension) 06/29/2011  . Intertrochanteric fracture of left femur (HCC) 06/29/2011  . Arthritis 06/29/2011  . Hypothyroid 06/29/2011    Orientation RESPIRATION BLADDER Height & Weight     Self, Time, Situation, Place  O2 Continent Weight:   Height:     BEHAVIORAL SYMPTOMS/MOOD NEUROLOGICAL BOWEL NUTRITION STATUS      Continent Diet (Please see discharge summary.)  AMBULATORY STATUS COMMUNICATION OF NEEDS Skin   Extensive Assist Verbally Surgical wounds                        Personal Care Assistance Level of Assistance  Bathing, Feeding, Dressing Bathing Assistance: Limited assistance Feeding assistance: Limited assistance Dressing Assistance: Limited assistance     Functional Limitations Info             SPECIAL CARE FACTORS FREQUENCY  PT (By licensed PT), OT (By licensed OT)                    Contractures      Additional Factors Info  Code Status, Allergies Code Status Info: FULL Allergies Info: No known           Current Medications (05/30/2015):  This is the current hospital active medication list Current Facility-Administered Medications  Medication Dose Route Frequency Provider Last Rate Last Dose  . 0.9 %  sodium chloride infusion   Intravenous Continuous Guy Sandifer, [place]      . acetaminophen (TYLENOL) tablet 650 mg  650 mg Oral Q6H PRN Guy Sandifer, PA       Or  . acetaminophen (TYLENOL) suppository 650 mg  650 mg Rectal Q6H PRN Guy Sandifer, PA      . alum & mag hydroxide-simeth (MAALOX/MYLANTA) 200-200-20 MG/5ML suspension 30 mL  30 mL Oral Q4H PRN Guy Sandifer, PA      . bisacodyl (DULCOLAX) EC tablet 5 mg  5 mg Oral Daily PRN Guy Sandifer, PA      . celecoxib (CELEBREX) capsule 200 mg  200 mg Oral Q12H Guy Sandifer, [place]  200 mg at 05/30/15 1003  . diphenhydrAMINE (BENADRYL) 12.5 MG/5ML elixir 12.5-25 mg  12.5-25 mg Oral Q4H PRN Guy Sandiferolby Alan Robbins, PA      . docusate sodium (COLACE) capsule 100 mg  100 mg Oral BID Guy Sandiferolby Alan Robbins, PA   100 mg at 05/30/15 1002  . fesoterodine (TOVIAZ) tablet 4 mg  4 mg Oral Daily Guy Sandiferolby Alan Robbins, [place]   4 mg at 05/30/15 1003  . furosemide (LASIX) tablet 40 mg  40 mg Oral Daily Guy Sandiferolby Alan Robbins, [place]   40 mg at 05/30/15 1002  . HYDROmorphone (DILAUDID) injection 0.5 mg  0.5 mg Intravenous Q2H PRN Guy Sandiferolby Alan Robbins, PA      . levothyroxine (SYNTHROID, LEVOTHROID) tablet 25 mcg  25 mcg Oral QAC breakfast Guy Sandiferolby Alan Robbins, [place]    25 mcg at 05/30/15 [id number]  . losartan (COZAAR) tablet 50 mg  50 mg Oral Daily Guy Sandiferolby Alan Robbins, [place]   50 mg at 05/30/15 1002  . menthol-cetylpyridinium (CEPACOL) lozenge 3 mg  1 lozenge Oral PRN Guy Sandiferolby Alan Robbins, PA       Or  . phenol (CHLORASEPTIC) mouth spray 1 spray  1 spray Mouth/Throat PRN Guy Sandiferolby Alan Robbins, PA      . methocarbamol (ROBAXIN) tablet 500 mg  500 mg Oral Q6H PRN Guy Sandiferolby Alan Robbins, PA       Or  . methocarbamol (ROBAXIN) 500 mg in dextrose 5 % 50 mL IVPB  500 mg Intravenous Q6H PRN Guy Sandiferolby Alan Robbins, PA      . metoCLOPramide (REGLAN) tablet 5 mg  5 mg Oral Q8H PRN Dannielle HuhSteve Lucey, MD       Or  . metoCLOPramide (REGLAN) injection 5 mg  5 mg Intravenous Q8H PRN Dannielle HuhSteve Lucey, MD      . ondansetron [facility](ZOFRAN) tablet 4 mg  4 mg Oral Q6H PRN Guy Sandiferolby Alan Robbins, PA       Or  . ondansetron [facility](ZOFRAN) injection 4 mg  4 mg Intravenous Q6H PRN Guy Sandiferolby Alan Robbins, PA      . oxyCODONE (Oxy IR/ROXICODONE) immediate release tablet 5-10 mg  5-10 mg Oral Q3H PRN Guy Sandiferolby Alan Robbins, PA   10 mg at 05/30/15 [id number]  . oxyCODONE (OXYCONTIN) 12 hr tablet 10 mg  10 mg Oral Q12H Guy Sandiferolby Alan Robbins, [place]   10 mg at 05/30/15 1007  . rivaroxaban (XARELTO) tablet 10 mg  10 mg Oral Q breakfast Guy Sandiferolby Alan Robbins, [place]   10 mg at 05/30/15 0802  . senna-docusate (Senokot-S) tablet 1 tablet  1 tablet Oral QHS PRN Guy Sandiferolby Alan Robbins, PA      . sodium phosphate (FLEET) 7-19 GM/118ML enema 1 enema  1 enema Rectal Once PRN Guy Sandiferolby Alan Robbins, PA      . tranexamic acid (CYKLOKAPRON) 1,000 mg in sodium chloride 0.9 % 100 mL IVPB  1,000 mg Intravenous Once Guy Sandiferolby Alan Robbins, [place]      . zolpidem [facility](AMBIEN) tablet 5 mg  5 mg Oral QHS PRN Guy Sandiferolby Alan Robbins, PA         Discharge Medications: Please see discharge summary for a list of discharge medications.  Relevant Imaging Results:  Relevant Lab Results:   Additional Information SSN: [id number]  Rod MaeVaughn, Svetlana Bagby S, LCSW

## 2015-05-30 NOTE — Discharge Instructions (Signed)
Information on my medicine - XARELTO® (Rivaroxaban) ° °This medication education was reviewed with me or my healthcare representative as part of my discharge preparation.  The pharmacist that spoke with me during my hospital stay was:  Leah Skora, Pharm.D. ° °Why was Xarelto® prescribed for you? °Xarelto® was prescribed for you to reduce the risk of blood clots forming after orthopedic surgery. The medical term for these abnormal blood clots is venous thromboembolism (VTE). ° °What do you need to know about xarelto® ? °Take your Xarelto® ONCE DAILY at the same time every day. °You may take it either with or without food. ° °If you have difficulty swallowing the tablet whole, you may crush it and mix in applesauce just prior to taking your dose. ° °Take Xarelto® exactly as prescribed by your doctor and DO NOT stop taking Xarelto® without talking to the doctor who prescribed the medication.  Stopping without other VTE prevention medication to take the place of Xarelto® may increase your risk of developing a clot. ° °After discharge, you should have regular check-up appointments with your healthcare provider that is prescribing your Xarelto®.   ° °What do you do if you miss a dose? °If you miss a dose, take it as soon as you remember on the same day then continue your regularly scheduled once daily regimen the next day. Do not take two doses of Xarelto® on the same day.  ° °Important Safety Information °A possible side effect of Xarelto® is bleeding. You should call your healthcare provider right away if you experience any of the following: °? Bleeding from an injury or your nose that does not stop. °? Unusual colored urine (red or dark brown) or unusual colored stools (red or black). °? Unusual bruising for unknown reasons. °? A serious fall or if you hit your head (even if there is no bleeding). ° °Some medicines may interact with Xarelto® and might increase your risk of bleeding while on Xarelto®. To help  avoid this, consult your healthcare provider or pharmacist prior to using any new prescription or non-prescription medications, including herbals, vitamins, non-steroidal anti-inflammatory drugs (NSAIDs) and supplements. ° °This website has more information on Xarelto®: www.xarelto.com. ° ° ° °

## 2015-05-30 NOTE — Care Management Note (Signed)
Case Management Note  Patient Details  Name: Jessica Hawkins MRN: [id number] Date of Birth: May 06, 1922  Subjective/Objective:  82 yr old female s/p left total knee arthroplasty.                  Action/Plan:  [facility]. Social worker is aware.    Expected Discharge Date:    05/31/15              Expected Discharge Plan:  Skilled Nursing Facility  In-House Referral:  Clinical Social Work  Discharge planning Services  CM Consult  Post Acute Care Choice:    Choice offered to:  NA  DME Arranged:  N/A DME Agency:     HH Arranged:  NA HH Agency:  NA  Status of Service:  Completed, signed off  Medicare Important Message Given:    Date Medicare IM Given:    Medicare IM give by:    Date Additional Medicare IM Given:    Additional Medicare Important Message give by:     If discussed at Long Length of Stay Meetings, dates discussed:    Additional Comments:  Durenda GuthrieBrady, Ibrahima Holberg Naomi, RN 05/30/2015, 10:59 AM

## 2015-05-30 NOTE — Clinical Social Work Note (Signed)
CSW spoke with patient's daughter, Lady GaryFern Gunia, regarding discharge planning for patient. Per patient's daughter, [facility] medically stable for discharge. CSW to meet with patient and patient's daughter on 05/31/2015 between 11-11:30 am to finalize discharge planning.  Full assessment to follow.  Marcelline Deistmily Ellon Marasco, LCSW [phone] Orthopedics: [phone] Surgical: [phone]

## 2015-05-30 NOTE — Evaluation (Addendum)
Physical Therapy Evaluation Patient Details Name: Jessica Hawkins MRN: [id number] DOB: 12-29-1922 Today's Date: 05/30/2015   History of Present Illness  82 yo admitted for L TKA. PMHx: incontinence, arthritis. bleeding d/o  Clinical Impression  Pt pleasant, HOH and moving slowly. Pt with great difficulty with standing, anterior translation and stepping. Pt with generalized weakness, decreased balance and function who will benefit from acute therapy to maximize mobility, gait, function and strength to decrease burden of care. Pt with plans for [facility] at D/C.    Follow Up Recommendations SNF;Supervision/Assistance - 24 hour    Equipment Recommendations  None recommended by PT    Recommendations for Other Services       Precautions / Restrictions Precautions Precautions: Fall;Knee Restrictions LLE Weight Bearing: Weight bearing as tolerated      Mobility  Bed Mobility Overal bed mobility: Needs Assistance Bed Mobility: Supine to Sit     Supine to sit: Min guard     General bed mobility comments: guarding for IV, safety and cues for sequence  Transfers Overall transfer level: Needs assistance   Transfers: Sit to/from Stand;Stand Pivot Transfers Sit to Stand: Mod assist;+2 physical assistance Stand pivot transfers: Mod assist;+2 physical assistance       General transfer comment: max cueing for hand placement, anterior translation with assist for rise from surface with assist to pivot walker and pelvis to [facility]  Ambulation/Gait Ambulation/Gait assistance: Mod assist;+2 physical assistance Ambulation Distance (Feet): 5 Feet Assistive device: Rolling walker (2 wheeled) Gait Pattern/deviations: Shuffle;Trunk flexed;Narrow base of support   Gait velocity interpretation: Below normal speed for age/gender General Gait Details: max cues for safety, sequence, assist to advance RW, pt maintains flexed posture and chair to follow closely behind  Stairs             Wheelchair Mobility    Modified Rankin (Stroke Patients Only)       Balance Overall balance assessment: Needs assistance   Sitting balance-Leahy Scale: Good       Standing balance-Leahy Scale: Poor                               Pertinent Vitals/Pain Pain Assessment: 0-10 Pain Score: 2  Pain Location: left knee Pain Descriptors / Indicators: Sore Pain Intervention(s): Limited activity within patient's tolerance;Premedicated before session;Repositioned    Home Living Family/patient expects to be discharged to:: Private residence Living Arrangements: Children Available Help at Discharge: Family;Available PRN/intermittently Type of Home: House Home Access: Ramped entrance     Home Layout: Two level;Able to live on main level with bedroom/bathroom Home Equipment: Dan Humphreys - 2 wheels;Bedside commode;Cane - single point      Prior Function Level of Independence: Independent with assistive device(s)               Hand Dominance        Extremity/Trunk Assessment   Upper Extremity Assessment: Generalized weakness           Lower Extremity Assessment: Generalized weakness      Cervical / Trunk Assessment: Normal  Communication   Communication: HOH  Cognition Arousal/Alertness: Awake/alert Behavior During Therapy: WFL for tasks assessed/performed Overall Cognitive Status: Within Functional Limits for tasks assessed                      General Comments      Exercises Total Joint Exercises Quad Sets: AROM;Left;5 reps;Supine Heel Slides: AAROM;Left;5 reps;Supine Straight Leg  Raises: AAROM;Left;5 reps;Supine      Assessment/Plan    PT Assessment Patient needs continued PT services  PT Diagnosis Difficulty walking;Acute pain;Generalized weakness   PT Problem List Decreased strength;Decreased range of motion;Decreased activity tolerance;Decreased balance;Decreased mobility;Pain;Decreased knowledge of precautions;Decreased  knowledge of use of DME;Decreased safety awareness  PT Treatment Interventions DME instruction;Gait training;Functional mobility training;Therapeutic activities;Therapeutic exercise;Balance training;Patient/family education   PT Goals (Current goals can be found in the Care Plan section) Acute Rehab PT Goals Patient Stated Goal: walk on the front porch PT Goal Formulation: With patient Time For Goal Achievement: 06/06/15 Potential to Achieve Goals: Fair    Frequency 7X/week   Barriers to discharge Decreased caregiver support      Co-evaluation               End of Session Equipment Utilized During Treatment: Gait belt Activity Tolerance: Patient tolerated treatment well Patient left: in chair;with call bell/phone within reach;with chair alarm set Nurse Communication: Mobility status         Time: 0727-0808 PT Time Calculation (min) (ACUTE ONLY): 41 min   Charges:   PT Evaluation $PT Eval Moderate Complexity: 1 Procedure PT Treatments $Therapeutic Activity: 23-37 mins   PT G CodesDelorse Lek:        Tabor, Rachelle Edwards Beth 05/30/2015, 8:10 AM Delaney MeigsMaija Tabor Davinia Riccardi, PT [phone]

## 2015-05-30 NOTE — Progress Notes (Signed)
SPORTS MEDICINE AND JOINT REPLACEMENT  Jessica SpurlingStephen Lucey, MD   [facility] Orvil Faraone PA-C [address], [place], [place]  [address]                             [phone]   PROGRESS NOTE  Subjective:  negative for Chest Pain  negative for Shortness of Breath  negative for Nausea/Vomiting   negative for Calf Pain  negative for Bowel Movement   Tolerating Diet: yes         Patient reports pain as 6 on 0-10 scale.    Objective: Vital signs in last 24 hours:   Patient Vitals for the past 24 hrs:  BP Temp Temp src Pulse Resp SpO2  05/29/15 1435 (!) 171/64 mmHg - - 64 14 100 %  05/29/15 1415 (!) 180/67 mmHg 98.4 F (36.9 C) - 68 (!) 9 100 %  05/29/15 1400 (!) 170/71 mmHg - - 64 12 100 %  05/29/15 1345 (!) 173/71 mmHg - - 66 13 100 %  05/29/15 1331 - - - - - 100 %  05/29/15 1330 (!) 162/64 mmHg - - 66 14 (!) 85 %  05/29/15 1315 (!) 166/57 mmHg - - 70 16 100 %  05/29/15 1300 (!) 171/61 mmHg - - 66 12 99 %  05/29/15 1248 (!) 169/64 mmHg 97.5 F (36.4 C) - 68 (!) 9 97 %  05/29/15 0827 (!) 171/69 mmHg 97.5 F (36.4 C) Oral 64 16 100 %    @flow {1959:LAST@   Intake/Output from previous day:   03/27 0701 - 03/28 0700 In: 1368.3 [I.V.:1368.3] Out: 150    Intake/Output this shift:       Intake/Output      03/27 0701 - 03/28 0700 03/28 0701 - 03/29 0700   I.V. 1368.3    Total Intake 1368.3     Urine 0    Blood 150    Total Output 150     Net +1218.3          Urine Occurrence 5 x       LABORATORY DATA:  Recent Labs  05/30/15 0552  WBC 7.4  HGB 10.1*  HCT 31.3*  PLT 150    Recent Labs  05/30/15 0552  NA 135  K 3.7  CL 100*  CO2 27  BUN 18  CREATININE 1.07*  GLUCOSE 151*  CALCIUM 8.4*   Lab Results  Component Value Date   INR 1.10 05/19/2015   INR 1.06 07/04/2011   INR 1.22 06/30/2011    Examination:  General appearance: alert, cooperative and no distress Extremities: extremities normal, atraumatic, no cyanosis or edema  Wound Exam: clean, dry,  intact   Drainage:  None: wound tissue dry  Motor Exam: Quadriceps and Hamstrings Intact  Sensory Exam: Superficial Peroneal, Deep Peroneal and Tibial normal   Assessment:    1 Day Post-Op  Procedure(s) (LRB): TOTAL KNEE ARTHROPLASTY (Left)  ADDITIONAL DIAGNOSIS:  Active Problems:   S/P total knee arthroplasty  Acute Blood Loss Anemia   Plan: Physical Therapy as ordered Weight Bearing as Tolerated (WBAT)  DVT Prophylaxis:  Xarelto  DISCHARGE PLAN: Skilled Nursing Facility/Rehab  DISCHARGE NEEDS: CPM and Jessica Hawkins         Jessica Hawkins 05/30/2015, 7:18 AM

## 2015-05-31 LAB — CBC
HEMATOCRIT: 29.5 % — AB (ref 36.0–46.0)
Hemoglobin: 9.7 g/dL — ABNORMAL LOW (ref 12.0–15.0)
MCH: 27.3 pg (ref 26.0–34.0)
MCHC: 32.9 g/dL (ref 30.0–36.0)
MCV: 83.1 fL (ref 78.0–100.0)
Platelets: 134 10*3/uL — ABNORMAL LOW (ref 150–400)
RBC: 3.55 MIL/uL — ABNORMAL LOW (ref 3.87–5.11)
RDW: 13.9 % (ref 11.5–15.5)
WBC: 8.8 10*3/uL (ref 4.0–10.5)

## 2015-05-31 MED ORDER — OXYCODONE HCL 5 MG PO TABS: 5.0000 mg | ORAL_TABLET | ORAL | Status: DC | PRN

## 2015-05-31 MED ORDER — OXYCODONE HCL ER 10 MG PO T12A: 10.0000 mg | EXTENDED_RELEASE_TABLET | Freq: Two times a day (BID) | ORAL | Status: DC

## 2015-05-31 MED ORDER — RIVAROXABAN 10 MG PO TABS: 10.0000 mg | ORAL_TABLET | Freq: Every day | ORAL | Status: DC

## 2015-05-31 MED ORDER — METHOCARBAMOL 500 MG PO TABS: 500.0000 mg | ORAL_TABLET | Freq: Four times a day (QID) | ORAL | Status: DC | PRN

## 2015-05-31 MED ORDER — POLYETHYLENE GLYCOL 3350 17 G PO PACK
17.0000 g | PACK | Freq: Every day | ORAL | Status: DC
  Administered 2015-05-31 – 2015-06-01 (×2): 17 g via ORAL
  Filled 2015-05-31 (×2): qty 1

## 2015-05-31 MED ORDER — ONDANSETRON HCL 4 MG PO TABS: 4.0000 mg | ORAL_TABLET | Freq: Four times a day (QID) | ORAL | Status: DC | PRN

## 2015-05-31 NOTE — Progress Notes (Signed)
Physical Therapy Treatment Patient Details Name: Jessica Hawkins MRN: [id number] DOB: [birth year] Today's Date: 05/31/2015    History of Present Illness 82 yo admitted for L TKA. PMHx: incontinence, arthritis. bleeding d/o    PT Comments    Patient is progressing gradually toward mobility goals and with no c/o pain throughout session. Daughter present and reported pt is typically much more alert and believes she is staying sleepy due to medications. Edema present in L foot--RN aware. Current plan remains appropriate.   Follow Up Recommendations  SNF;Supervision/Assistance - 24 hour     Equipment Recommendations  None recommended by PT    Recommendations for Other Services       Precautions / Restrictions Precautions Precautions: Fall;Knee Restrictions Weight Bearing Restrictions: Yes LLE Weight Bearing: Weight bearing as tolerated    Mobility  Bed Mobility Overal bed mobility: Needs Assistance Bed Mobility: Supine to Sit     Supine to sit: Min guard     General bed mobility comments: cues for sequencing and use of bedrails  Transfers Overall transfer level: Needs assistance Equipment used: Rolling walker (2 wheeled) Transfers: Sit to/from Stand Sit to Stand: Mod assist;+2 safety/equipment;+2 physical assistance Stand pivot transfers: Mod assist;+2 physical assistance;+2 safety/equipment       General transfer comment: cues for sequencing, position of RW, and tactile and verbal cues for posture; pt with heavy posterior lean  Ambulation/Gait Ambulation/Gait assistance: +2 safety/equipment;Mod assist Ambulation Distance (Feet): 25 Feet Assistive device: Rolling walker (2 wheeled) Gait Pattern/deviations: Step-to pattern;Step-through pattern;Decreased stance time - left;Decreased step length - right;Leaning posteriorly;Trunk flexed;Narrow base of support     General Gait Details: assist to maintain balance at times with posterior lean noted during ambulation but  improved with frequent multimodal cues;  assist to guide RW at times and max vc for sequencing, position of RW, and posture   Stairs            Wheelchair Mobility    Modified Rankin (Stroke Patients Only)       Balance Overall balance assessment: Needs assistance Sitting-balance support: No upper extremity supported;Feet supported Sitting balance-Leahy Scale: Fair   Postural control: Posterior lean Standing balance support: Bilateral upper extremity supported Standing balance-Leahy Scale: Poor Standing balance comment: heavy reliance on RW                    Cognition Arousal/Alertness: Lethargic;Suspect due to medications Behavior During Therapy: [facility] for tasks assessed/performed Overall Cognitive Status: Within Functional Limits for tasks assessed                      Exercises Total Joint Exercises Ankle Circles/Pumps: AROM;Both;10 reps;Seated Quad Sets: AROM;Left;10 reps;Seated Heel Slides: AAROM;Left;10 reps;Seated    General Comments General comments (skin integrity, edema, etc.): pt with edema in L foot and RN notified      Pertinent Vitals/Pain Pain Assessment: No/denies pain Faces Pain Scale: Hurts a little bit Pain Location: left knee with mobility  Pain Descriptors / Indicators: Sore;Guarding Pain Intervention(s): Monitored during session;Premedicated before session;Repositioned    Home Living Family/patient expects to be discharged to:: Skilled nursing facility Living Arrangements: Children Available Help at Discharge: Family;Available PRN/intermittently Type of Home: House Home Access: Ramped entrance   Home Layout: Two level;Able to live on main level with bedroom/bathroom Home Equipment: Dan Humphreys - 2 wheels;Bedside commode;Cane - single point;Tub bench      Prior Function Level of Independence: Independent with assistive device(s)      Comments: Pt goes  to gym 3X per week, uses a personal trainer    PT Goals (current goals  can now be found in the care plan section) Acute Rehab PT Goals Patient Stated Goal: none stated Progress towards PT goals: Progressing toward goals    Frequency  7X/week    PT Plan Current plan remains appropriate    Co-evaluation             End of Session Equipment Utilized During Treatment: Gait belt Activity Tolerance: Patient tolerated treatment well Patient left: in chair;with call bell/phone within reach;with family/visitor present     Time: [id number] PT Time Calculation (min) (ACUTE ONLY): 41 min  Charges:  $Gait Training: 8-22 mins $Therapeutic Exercise: 8-22 mins $Therapeutic Activity: 8-22 mins                    G Codes:      Derek MoundKellyn R Gevorg Brum Luisana Lutzke, PTA Pager: [phone]   05/31/2015, 2:44 PM

## 2015-05-31 NOTE — Progress Notes (Signed)
SPORTS MEDICINE AND JOINT REPLACEMENT  Georgena SpurlingStephen Lucey, MD   [facility] [address], [place], [place]  [address]                             [phone]   PROGRESS NOTE  Subjective:  negative for Chest Pain  negative for Shortness of Breath  negative for Nausea/Vomiting   negative for Calf Pain  negative for Bowel Movement   Tolerating Diet: yes         Patient reports pain as 5 on 0-10 scale.    Objective: Vital signs in last 24 hours:   Patient Vitals for the past 24 hrs:  BP Temp Pulse Resp SpO2  05/31/15 0500 (!) 187/68 mmHg 97.9 F (36.6 C) 96 16 97 %  05/30/15 2100 (!) 133/52 mmHg 98.6 F (37 C) (!) 102 16 99 %  05/30/15 1300 130/61 mmHg - 75 16 98 %  05/30/15 0742 - - - - 100 %    @flow {1959:LAST@   Intake/Output from previous day:   03/28 0701 - 03/29 0700 In: 600 [P.O.:600] Out: 400 [Urine:400]   Intake/Output this shift:       Intake/Output      03/28 0701 - 03/29 0700   P.O. 600   Total Intake 600   Urine 400   Total Output 400   Net +200       Urine Occurrence 2 x      LABORATORY DATA:  Recent Labs  05/30/15 0552 05/31/15 0415  WBC 7.4 8.8  HGB 10.1* 9.7*  HCT 31.3* 29.5*  PLT 150 134*    Recent Labs  05/30/15 0552  NA 135  K 3.7  CL 100*  CO2 27  BUN 18  CREATININE 1.07*  GLUCOSE 151*  CALCIUM 8.4*   Lab Results  Component Value Date   INR 1.10 05/19/2015   INR 1.06 07/04/2011   INR 1.22 06/30/2011    Examination:  General appearance: alert, cooperative and no distress Extremities: extremities normal, atraumatic, no cyanosis or edema  Wound Exam: clean, dry, intact   Drainage:  None: wound tissue dry  Motor Exam: Quadriceps and Hamstrings Intact  Sensory Exam: Superficial Peroneal, Deep Peroneal and Tibial normal   Assessment:    2 Days Post-Op  Procedure(s) (LRB): TOTAL KNEE ARTHROPLASTY (Left)  ADDITIONAL DIAGNOSIS:  Active Problems:   S/P total knee arthroplasty  Acute Blood Loss  Anemia   Plan: Physical Therapy as ordered Weight Bearing as Tolerated (WBAT)  DVT Prophylaxis:  Xarelto  DISCHARGE PLAN: Skilled Nursing Facility/Rehab  DISCHARGE NEEDS: CPM and Dan HumphreysWalker   Awaiting [facility] decision to [facility]. Either today or tomorrow         Guy SandiferColby Alan Robbins 05/31/2015, 6:46 AM

## 2015-05-31 NOTE — Discharge Summary (Signed)
SPORTS MEDICINE & JOINT REPLACEMENT   Georgena Spurling, MD    Laurier Nancy, PA-C [address] [place], [place], [place]  [address]                             [phone]  PATIENT ID: Jessica Hawkins        MRN:  [id number]          DOB/AGE: [birth year] / 82 y.o.    DISCHARGE SUMMARY  ADMISSION DATE:    05/29/2015 DISCHARGE DATE:   06/01/2015   ADMISSION DIAGNOSIS: primary osteoarthritis left knee    DISCHARGE DIAGNOSIS:  primary osteoarthritis left knee    ADDITIONAL DIAGNOSIS: Active Problems:   S/P total knee arthroplasty  Past Medical History  Diagnosis Date  . Hypertension   . Osteoporosis   . GERD (gastroesophageal reflux disease) 06/29/2011  . Hypothyroid 06/29/2011  . HTN (hypertension) 06/29/2011  . Platelet disorder (HCC)     ? Von Willebrand; abnormal PLT function assay 07/04/11   . Hyperlipidemia   . Diabetes mellitus without complication (HCC)   . Edema   . BBB (bundle branch block)     RT  . Hiatal hernia   . Fibrocystic breast   . Arthritis     Osteoarthritis  . Vertigo   . Insomnia   . Gait disorder   . Unspecified constipation   . Disturbance of skin sensation   . Unspecified vitamin D deficiency   . Unspecified urinary incontinence   . Unspecified sleep apnea   . Coronary atherosclerosis of unspecified type of vessel, native or graft   . Diaphragmatic hernia without mention of obstruction or gangrene   . Debility, unspecified   . History of transfusion of packed red blood cells   . Anginal pain (HCC)     'i haven't had them in a long time but i used to'    PROCEDURE: Procedure(s): TOTAL KNEE ARTHROPLASTY on 05/29/2015  CONSULTS:     HISTORY:  See H&P in chart  HOSPITAL COURSE:  LAMONDA NOXON is a 82 y.o. admitted on 05/29/2015 and found to have a diagnosis of primary osteoarthritis left knee.  After appropriate laboratory studies were obtained  they were taken to the operating room on 05/29/2015 and underwent Procedure(s): TOTAL KNEE  ARTHROPLASTY.   They were given perioperative antibiotics:  Anti-infectives    Start     Dose/Rate Route Frequency Ordered Stop   05/29/15 0820  ceFAZolin (ANCEF) 2-4 GM/100ML-% IVPB    Comments:  [facility] Large   : cabinet override      05/29/15 0820 05/29/15 2029   05/29/15 0600  ceFAZolin (ANCEF) 2 g in dextrose 5 % 50 mL IVPB     2 g 140 mL/hr over 30 Minutes Intravenous On call to O.R. 05/28/15 1459 05/29/15 1040    .  Patient given tranexamic acid IV or topical and exparel intra-operatively.  Tolerated the procedure well.    POD# 1: Vital signs were stable.  Patient denied Chest pain, shortness of breath, or calf pain.  Patient was started on Lovenox 30 mg subcutaneously twice daily at 8am.  Consults to PT, OT, and care management were made.  The patient was weight bearing as tolerated.  CPM was placed on the operative leg 0-90 degrees for 6-8 hours a day. When out of the CPM, patient was placed in the foam block to achieve full extension. Incentive spirometry was taught.  Dressing was  changed.       POD #2, Continued  PT for ambulation and exercise program.  IV saline locked.  O2 discontinued.    The remainder of the hospital course was dedicated to ambulation and strengthening.   The patient was discharged on 3 days post-op in  Good condition.  Blood products given:none  DIAGNOSTIC STUDIES: Recent vital signs: Patient Vitals for the past 24 hrs:  BP Temp Pulse Resp SpO2  05/31/15 0500 (!) 187/68 mmHg 97.9 F (36.6 C) 96 16 97 %  05/30/15 2100 (!) 133/52 mmHg 98.6 F (37 C) (!) 102 16 99 %       Recent laboratory studies:  Recent Labs  05/30/15 0552 05/31/15 0415  WBC 7.4 8.8  HGB 10.1* 9.7*  HCT 31.3* 29.5*  PLT 150 134*    Recent Labs  05/30/15 0552  NA 135  K 3.7  CL 100*  CO2 27  BUN 18  CREATININE 1.07*  GLUCOSE 151*  CALCIUM 8.4*   Lab Results  Component Value Date   INR 1.10 05/19/2015   INR 1.06 07/04/2011   INR 1.22 06/30/2011      Recent Radiographic Studies :  Dg Chest 2 View  05/19/2015  CLINICAL DATA:  Preoperative evaluation for knee arthroplasty. Hypertension. EXAM: CHEST  2 VIEW COMPARISON:  May 12, 2013 FINDINGS: There is no edema or consolidation. Heart is upper normal in size with pulmonary vascularity within normal limits. There is atherosclerotic calcification in aorta with mild aortic tortuosity, stable. There is postoperative change in the left shoulder. IMPRESSION: No edema or consolidation.  Stable cardiac silhouette. Electronically Signed   By: Bretta Bang III M.D.   On: 05/19/2015 16:30    DISCHARGE INSTRUCTIONS: Discharge Instructions    CPM    Complete by:  As directed   Continuous passive motion machine (CPM):      Use the CPM from 0 to 90 for 4-6 hours per day.      You may increase by 10 per day.  You may break it up into 2 or 3 sessions per day.      Use CPM for 2 weeks or until you are told to stop.     Call MD / Call 911    Complete by:  As directed   If you experience chest pain or shortness of breath, CALL 911 and be transported to the hospital emergency room.  If you develope a fever above 101 F, pus (white drainage) or increased drainage or redness at the wound, or calf pain, call your surgeon's office.     Change dressing    Complete by:  As directed   Change dressing on tomorrow, then change the dressing daily with sterile 4 x 4 inch gauze dressing and apply TED hose.  You may clean the incision with alcohol prior to redressing.     Constipation Prevention    Complete by:  As directed   Drink plenty of fluids.  Prune juice may be helpful.  You may use a stool softener, such as Colace (over the counter) 100 mg twice a day.  Use MiraLax (over the counter) for constipation as needed.     Diet - low sodium heart healthy    Complete by:  As directed      Discharge instructions    Complete by:  As directed   INSTRUCTIONS AFTER JOINT REPLACEMENT   Remove items at home which could  result in a fall. This includes throw rugs or  furniture in walking pathways ICE to the affected joint every three hours while awake for 30 minutes at a time, for at least the first 3-5 days, and then as needed for pain and swelling.  Continue to use ice for pain and swelling. You may notice swelling that will progress down to the foot and ankle.  This is normal after surgery.  Elevate your leg when you are not up walking on it.   Continue to use the breathing machine you got in the hospital (incentive spirometer) which will help keep your temperature down.  It is common for your temperature to cycle up and down following surgery, especially at night when you are not up moving around and exerting yourself.  The breathing machine keeps your lungs expanded and your temperature down.   DIET:  As you were doing prior to hospitalization, we recommend a well-balanced diet.  DRESSING / WOUND CARE / SHOWERING  You may change your dressing 3-5 days after surgery.  Then change the dressing every day with sterile gauze.  Please use good hand washing techniques before changing the dressing.  Do not use any lotions or creams on the incision until instructed by your surgeon.  ACTIVITY  Increase activity slowly as tolerated, but follow the weight bearing instructions below.   No driving for 6 weeks or until further direction given by your physician.  You cannot drive while taking narcotics.  No lifting or carrying greater than 10 lbs. until further directed by your surgeon. Avoid periods of inactivity such as sitting longer than an hour when not asleep. This helps prevent blood clots.  You may return to work once you are authorized by your doctor.     WEIGHT BEARING   Weight bearing as tolerated with assist device (walker, cane, etc) as directed, use it as long as suggested by your surgeon or therapist, typically at least 4-6 weeks.   EXERCISES  Results after joint replacement surgery are often greatly  improved when you follow the exercise, range of motion and muscle strengthening exercises prescribed by your doctor. Safety measures are also important to protect the joint from further injury. Any time any of these exercises cause you to have increased pain or swelling, decrease what you are doing until you are comfortable again and then slowly increase them. If you have problems or questions, call your caregiver or physical therapist for advice.   Rehabilitation is important following a joint replacement. After just a few days of immobilization, the muscles of the leg can become weakened and shrink (atrophy).  These exercises are designed to build up the tone and strength of the thigh and leg muscles and to improve motion. Often times heat used for twenty to thirty minutes before working out will loosen up your tissues and help with improving the range of motion but do not use heat for the first two weeks following surgery (sometimes heat can increase post-operative swelling).   These exercises can be done on a training (exercise) mat, on the floor, on a table or on a bed. Use whatever works the best and is most comfortable for you.    Use music or television while you are exercising so that the exercises are a pleasant break in your day. This will make your life better with the exercises acting as a break in your routine that you can look forward to.   Perform all exercises about fifteen times, three times per day or as directed.  You should exercise both  the operative leg and the other leg as well.   Exercises include:   Quad Sets - Tighten up the muscle on the front of the thigh (Quad) and hold for 5-10 seconds.   Straight Leg Raises - With your knee straight (if you were given a brace, keep it on), lift the leg to 60 degrees, hold for 3 seconds, and slowly lower the leg.  Perform this exercise against resistance later as your leg gets stronger.  Leg Slides: Lying on your back, slowly slide your foot  toward your buttocks, bending your knee up off the floor (only go as far as is comfortable). Then slowly slide your foot back down until your leg is flat on the floor again.  Angel Wings: Lying on your back spread your legs to the side as far apart as you can without causing discomfort.  Hamstring Strength:  Lying on your back, push your heel against the floor with your leg straight by tightening up the muscles of your buttocks.  Repeat, but this time bend your knee to a comfortable angle, and push your heel against the floor.  You may put a pillow under the heel to make it more comfortable if necessary.   A rehabilitation program following joint replacement surgery can speed recovery and prevent re-injury in the future due to weakened muscles. Contact your doctor or a physical therapist for more information on knee rehabilitation.    CONSTIPATION  Constipation is defined medically as fewer than three stools per week and severe constipation as less than one stool per week.  Even if you have a regular bowel pattern at home, your normal regimen is likely to be disrupted due to multiple reasons following surgery.  Combination of anesthesia, postoperative narcotics, change in appetite and fluid intake all can affect your bowels.   YOU MUST use at least one of the following options; they are listed in order of increasing strength to get the job done.  They are all available over the counter, and you may need to use some, POSSIBLY even all of these options:    Drink plenty of fluids (prune juice may be helpful) and high fiber foods Colace 100 mg by mouth twice a day  Senokot for constipation as directed and as needed Dulcolax (bisacodyl), take with full glass of water  Miralax (polyethylene glycol) once or twice a day as needed.  If you have tried all these things and are unable to have a bowel movement in the first 3-4 days after surgery call either your surgeon or your primary doctor.    If you  experience loose stools or diarrhea, hold the medications until you stool forms back up.  If your symptoms do not get better within 1 week or if they get worse, check with your doctor.  If you experience "the worst abdominal pain ever" or develop nausea or vomiting, please contact the office immediately for further recommendations for treatment.   ITCHING:  If you experience itching with your medications, try taking only a single pain pill, or even half a pain pill at a time.  You can also use Benadryl over the counter for itching or also to help with sleep.   TED HOSE STOCKINGS:  Use stockings on both legs until for at least 2 weeks or as directed by physician office. They may be removed at night for sleeping.  MEDICATIONS:  See your medication summary on the "After Visit Summary" that nursing will review with you.  You may  have some home medications which will be placed on hold until you complete the course of blood thinner medication.  It is important for you to complete the blood thinner medication as prescribed.  PRECAUTIONS:  If you experience chest pain or shortness of breath - call 911 immediately for transfer to the hospital emergency department.   If you develop a fever greater that 101 F, purulent drainage from wound, increased redness or drainage from wound, foul odor from the wound/dressing, or calf pain - CONTACT YOUR SURGEON.                                                   FOLLOW-UP APPOINTMENTS:  If you do not already have a post-op appointment, please call the office for an appointment to be seen by your surgeon.  Guidelines for how soon to be seen are listed in your "After Visit Summary", but are typically between 1-4 weeks after surgery.  OTHER INSTRUCTIONS:   Knee Replacement:  Do not place pillow under knee, focus on keeping the knee straight while resting. CPM instructions: 0-90 degrees, 2 hours in the morning, 2 hours in the afternoon, and 2 hours in the evening. Place foam  block, curve side up under heel at all times except when in CPM or when walking.  DO NOT modify, tear, cut, or change the foam block in any way.  MAKE SURE YOU:  Understand these instructions.  Get help right away if you are not doing well or get worse.    Thank you for letting us be a part of your medical care team.  It is a privilege we respect greatly.  We hope these instructions will help you stay on track for a fast and full recovery!     Increase activity slowly as tolerated    Complete by:  As directed            DISCHARGE MEDICATIONS:     Medication List    STOP taking these medications        CALCIUM CARBONATE PO     mupirocin ointment 2 %  Commonly known as:  BACTROBAN      TAKE these medications        AMBULATORY NON FORMULARY MEDICATION  Knee High Compression Hose with Zippers for both legs. Diagnosis M25.561, R60.9, I87.2     aspirin 81 MG tablet  Take 81 mg by mouth daily.     BAYER MICROLET LANCETS lancets  Use as instructed to check blood sugar once daily.250.00     furosemide 40 MG tablet  Commonly known as:  LASIX  Take one tablet by mouth once daily for edema     glucose blood test strip  Test blood sugar once daily     glucose blood test strip  Commonly known as:  BAYER CONTOUR TEST  Use as instructed to check blood sugar once daily. 250.00     levothyroxine 25 MCG tablet  Commonly known as:  SYNTHROID, LEVOTHROID  Take one tablet by mouth once daily for thyroid supplement     losartan 50 MG tablet  Commonly known as:  COZAAR  Take one tablet by mouth once daily to control blood pressure     methocarbamol 500 MG tablet  Commonly known as:  ROBAXIN  Take 1 tablet (500 mg total) by mouth every 6 (  six) hours as needed for muscle spasms.     multivitamin-iron-minerals-folic acid chewable tablet  Chew 1 tablet by mouth daily.     ondansetron 4 MG tablet  Commonly known as:  ZOFRAN  Take 1 tablet (4 mg total) by mouth every 6 (six) hours  as needed for nausea.     oxyCODONE 5 MG immediate release tablet  Commonly known as:  Oxy IR/ROXICODONE  Take 1 tablet (5 mg total) by mouth every 3 (three) hours as needed for breakthrough pain.     oxyCODONE 10 mg 12 hr tablet  Commonly known as:  OXYCONTIN  Take 1 tablet (10 mg total) by mouth every 12 (twelve) hours.     rivaroxaban 10 MG Tabs tablet  Commonly known as:  XARELTO  Take 1 tablet (10 mg total) by mouth daily.     tolterodine 4 MG 24 hr capsule  Commonly known as:  DETROL LA  Take one capsule by mouth once daily for bladder control        FOLLOW UP VISIT:       Follow-up Information    Follow up with Raymon Mutton, MD. Call on 06/13/2015.   Specialty:  Orthopedic Surgery   Contact information:   [address] [place] [address] [phone]       DISPOSITION: . SNF  CONDITION:  Meriel Pica 05/31/2015, 5:42 PM

## 2015-05-31 NOTE — Clinical Social Work Placement (Signed)
   CLINICAL SOCIAL WORK PLACEMENT  NOTE  Date:  05/31/2015  Patient Details  Name: Jessica Hawkins MRN: [id number] Date of Birth: 01/28/1923  Clinical Social Work is seeking post-discharge placement for this patient at the Skilled  Nursing Facility level of care (*CSW will initial, date and re-position this form in  chart as items are completed):  Yes   Patient/family provided with [facility] Clinical Social Work Department's list of facilities offering this level of care within the geographic area requested by the patient (or if unable, by the patient's family).  Yes   Patient/family informed of their freedom to choose among providers that offer the needed level of care, that participate in Medicare, Medicaid or managed care program needed by the patient, have an available bed and are willing to accept the patient.  Yes   Patient/family informed of [facility]'s ownership interest in [facility] and [facility], as well as of the fact that they are under no obligation to receive care at these facilities.  PASRR submitted to EDS on 05/31/15     PASRR number received on 05/31/15     Existing PASRR number confirmed on       FL2 transmitted to all facilities in geographic area requested by pt/family on 05/31/15     FL2 transmitted to all facilities within larger geographic area on       Patient informed that his/her managed care company has contracts with or will negotiate with certain facilities, including the following:        Yes   Patient/family informed of bed offers received.  [facility]     Physician recommends and patient [facility]    [facility]  .  Patient to be transferred to facility by [facility]     Patient family notified on   of transfer.  Name of family member notified:        PHYSICIAN Please prepare prescriptions, Please sign FL2     Additional Comment:     _______________________________________________ Rod MaeVaughn, Lynnetta Tom S, LCSW 05/31/2015, 12:56 PM

## 2015-05-31 NOTE — Clinical Social Work Note (Signed)
Clinical Social Work Assessment  Patient Details  Name: Jessica Hawkins MRN: [id number] Date of Birth: [birth year]  Date of referral:  05/31/15               Reason for consult:  Discharge Planning                Permission sought to share information with:  Facility Medical sales representativeContact Representative, Family Supports Permission granted to share information::  Yes, Verbal Permission Granted  Name::     Jessica Hawkins  [facility]  Relationship::  Daughter  Contact Information:     Housing/Transportation Living arrangements for the past 2 months:  Single Family Home Source of Information:  Adult Children Patient Interpreter Needed:  None Criminal Activity/Legal Involvement Pertinent to Current Situation/Hospitalization:  No - Comment as needed Significant Relationships:  Adult Children Lives with:  Self Do you feel safe going back to the place where you live?  No Need for family participation in patient care:  Yes (Comment) (Patient's daughter, Steward DroneBrenda, active in patient's care.)  Care giving concerns:  Patient's daughter expressed no concern at this time.   Social Worker assessment / plan:  *Bundle patient* CSW received referral that patient would be discharged to [facility] once medically stable for discharge. CSW confirmed with patient's daughter, Steward DroneBrenda, that patient is to [facility] once medically stable for discharge. Patient's daughter confirmed. CSW to continue to follow and assist with discharge planning needs.  Employment status:  Retired Health and safety inspectornsurance information:  Medicare PT Recommendations:  Skilled Nursing Facility Information / Referral to community resources:  Skilled Nursing Facility  Patient/Family's Response to care:  Patient's daughter understanding and agreeable to CSW plan of care.  Patient/Family's Understanding of and Emotional Response to Diagnosis, Current Treatment, and Prognosis:  Patient's daughter understanding and agreeable to CSW plan of  care.  Emotional Assessment Appearance:  Appears stated age Attitude/Demeanor/Rapport:  Other (Patient requested CSW speak with patient's daughter.) Affect (typically observed):  Other (Patient requested CSW speak with patient's daughter.) Orientation:  Oriented to Self, Oriented to Place, Oriented to  Time, Oriented to Situation Alcohol / Substance use:  Not Applicable Psych involvement (Current and /or in the community):  No (Comment) (Not appropriate on this admission.)  Discharge Needs  Concerns to be addressed:  No discharge needs identified Readmission within the last 30 days:  No Current discharge risk:  None Barriers to Discharge:  No Barriers Identified   Rod MaeVaughn, Maurica Omura S, LCSW 05/31/2015, 12:53 PM [phone]

## 2015-05-31 NOTE — Evaluation (Signed)
Occupational Therapy Evaluation Patient Details Name: Jessica Hawkins MRN: [id number] DOB: [birth year] Today's Date: 05/31/2015    History of Present Illness 82 yo admitted for L TKA. PMHx: incontinence, arthritis. bleeding d/o   Clinical Impression   Patient presenting with decreased ADL and functional mobility independence secondary to above. Patient independent and working out at a gym with a Systems analyst 3X per week PTA. Patient currently functioning at an overall min to max assist level. Patient will benefit from acute OT to increase overall independence in the areas of ADLs, functional mobility, and overall safety in order to safely discharge to venue listed below.     Follow Up Recommendations  SNF;Supervision/Assistance - 24 hour    Equipment Recommendations  Other (comment) (TBD next veune of care)    Recommendations for Other Services  None at this time   Precautions / Restrictions Precautions Precautions: Fall;Knee Restrictions Weight Bearing Restrictions: Yes LLE Weight Bearing: Weight bearing as tolerated    Mobility Bed Mobility General bed mobility comments: Pt found seated in recliner upon OT entering/exiting room   Transfers Overall transfer level: Needs assistance Equipment used: Rolling walker (2 wheeled) Transfers: Sit to/from [employer] Transfers Sit to Stand: Max assist Stand pivot transfers: Max assist General transfer comment: Max physical assist for transfers and max multimodal cueing for hand placment, technique, sequencing during transfers.     Balance Overall balance assessment: Needs assistance Sitting-balance support: No upper extremity supported;Feet supported Sitting balance-Leahy Scale: Fair   Postural control: Posterior lean Standing balance support: Bilateral upper extremity supported;During functional activity Standing balance-Leahy Scale: Poor Standing balance comment: heavy reliance on RW    ADL Overall ADL's : Needs  assistance/impaired Eating/Feeding: Set up;Sitting   Grooming: Set up;Sitting   Upper Body Bathing: Sitting;Minimal assitance   Lower Body Bathing: Moderate assistance;Sit to/from stand   Upper Body Dressing : Sitting;Minimal assistance   Lower Body Dressing: Maximal assistance;Sit to/from stand   Toilet Transfer: Maximal assistance;BSC;RW;Stand-pivot   Toileting- Architect and Hygiene: Sit to/from stand;Total assistance Toileting - Clothing Manipulation Details (indicate cue type and reason): assistance needed to move gown out of the way and assistance needed for peri cleansing    Tub/Shower Transfer Details (indicate cue type and reason): did not asses  Functional mobility during ADLs: Maximal assistance;Rolling walker General ADL Comments: Pt limited by generalized weakness, pain, and recent surgery. Pt reports RLE needing surgery too.     Vision Vision Assessment?: No apparent visual deficits          Pertinent Vitals/Pain Pain Assessment: Faces Faces Pain Scale: Hurts a little bit Pain Location: left knee with mobility  Pain Descriptors / Indicators: Sore;Guarding Pain Intervention(s): Limited activity within patient's tolerance;Monitored during session;Repositioned     Hand Dominance Right   Extremity/Trunk Assessment Upper Extremity Assessment Upper Extremity Assessment: Generalized weakness   Lower Extremity Assessment Lower Extremity Assessment: Generalized weakness   Cervical / Trunk Assessment Cervical / Trunk Assessment: Normal   Communication Communication Communication: HOH   Cognition Arousal/Alertness: Awake/alert Behavior During Therapy: WFL for tasks assessed/performed Overall Cognitive Status: Within Functional Limits for tasks assessed              Home Living Family/patient expects to be discharged to:: Skilled nursing facility Living Arrangements: Children Available Help at Discharge: Family;Available  PRN/intermittently Type of Home: House Home Access: Ramped entrance     Home Layout: Two level;Able to live on main level with bedroom/bathroom     Bathroom Shower/Tub: Tub/shower unit;Curtain  Bathroom Toilet: Standard     Home Equipment: Environmental consultantWalker - 2 wheels;Bedside commode;Cane - single point;Tub bench    Prior Functioning/Environment Level of Independence: Independent with assistive device(s)        Comments: Pt goes to gym 3X per week, uses a personal trainer     OT Diagnosis: Generalized weakness;Acute pain   OT Problem List: Decreased strength;Decreased range of motion;Decreased activity tolerance;Impaired balance (sitting and/or standing);Decreased coordination;Decreased safety awareness;Decreased knowledge of use of DME or AE;Decreased knowledge of precautions;Pain   OT Treatment/Interventions: Self-care/ADL training;Therapeutic exercise;Energy conservation;DME and/or AE instruction;Therapeutic activities;Patient/family education;Balance training    OT Goals(Current goals can be found in the care plan section) Acute Rehab OT Goals Patient Stated Goal: go to rehab before going home OT Goal Formulation: With patient/family Time For Goal Achievement: 06/14/15 Potential to Achieve Goals: Good ADL Goals Pt Will Perform Grooming: with min assist;standing Pt Will Perform Lower Body Bathing: with mod assist;sit to/from stand Pt Will Perform Lower Body Dressing: with mod assist;sit to/from stand Pt Will Transfer to Toilet: with min assist;ambulating;bedside commode  OT Frequency: Min 2X/week   Barriers to D/C: Decreased caregiver support   End of Session Equipment Utilized During Treatment: Gait belt;Rolling walker CPM Left Knee CPM Left Knee: Off  Activity Tolerance: Patient tolerated treatment well Patient left: in chair;with call bell/phone within reach;with family/visitor present   Time: [id number] OT Time Calculation (min): 25 min Charges:  OT General  Charges $OT Visit: 1 Procedure OT Evaluation $OT Eval Moderate Complexity: 1 Procedure OT Treatments $Self Care/Home Management : 8-22 mins  Edwin CapPatricia Floyd Lusignan , MS, OTR/L, CLT Pager: [phone]  05/31/2015, 1:32 PM

## 2015-06-01 ENCOUNTER — Other Ambulatory Visit: Payer: Self-pay

## 2015-06-01 ENCOUNTER — Inpatient Hospital Stay (HOSPITAL_COMMUNITY): Payer: Medicare Other

## 2015-06-01 DIAGNOSIS — R300 Dysuria: Secondary | ICD-10-CM | POA: Diagnosis not present

## 2015-06-01 DIAGNOSIS — E119 Type 2 diabetes mellitus without complications: Secondary | ICD-10-CM | POA: Diagnosis not present

## 2015-06-01 DIAGNOSIS — R2681 Unsteadiness on feet: Secondary | ICD-10-CM | POA: Diagnosis not present

## 2015-06-01 DIAGNOSIS — K5900 Constipation, unspecified: Secondary | ICD-10-CM | POA: Diagnosis not present

## 2015-06-01 DIAGNOSIS — N3281 Overactive bladder: Secondary | ICD-10-CM | POA: Diagnosis not present

## 2015-06-01 DIAGNOSIS — M25569 Pain in unspecified knee: Secondary | ICD-10-CM | POA: Diagnosis not present

## 2015-06-01 DIAGNOSIS — R262 Difficulty in walking, not elsewhere classified: Secondary | ICD-10-CM | POA: Diagnosis not present

## 2015-06-01 DIAGNOSIS — E039 Hypothyroidism, unspecified: Secondary | ICD-10-CM | POA: Diagnosis not present

## 2015-06-01 DIAGNOSIS — I10 Essential (primary) hypertension: Secondary | ICD-10-CM | POA: Diagnosis not present

## 2015-06-01 DIAGNOSIS — M6281 Muscle weakness (generalized): Secondary | ICD-10-CM | POA: Diagnosis not present

## 2015-06-01 DIAGNOSIS — R600 Localized edema: Secondary | ICD-10-CM | POA: Diagnosis not present

## 2015-06-01 DIAGNOSIS — Z96652 Presence of left artificial knee joint: Secondary | ICD-10-CM | POA: Diagnosis not present

## 2015-06-01 DIAGNOSIS — M1712 Unilateral primary osteoarthritis, left knee: Secondary | ICD-10-CM | POA: Diagnosis not present

## 2015-06-01 DIAGNOSIS — D62 Acute posthemorrhagic anemia: Secondary | ICD-10-CM | POA: Diagnosis not present

## 2015-06-01 DIAGNOSIS — M810 Age-related osteoporosis without current pathological fracture: Secondary | ICD-10-CM | POA: Diagnosis not present

## 2015-06-01 DIAGNOSIS — Z471 Aftercare following joint replacement surgery: Secondary | ICD-10-CM | POA: Diagnosis not present

## 2015-06-01 DIAGNOSIS — N289 Disorder of kidney and ureter, unspecified: Secondary | ICD-10-CM | POA: Diagnosis not present

## 2015-06-01 DIAGNOSIS — E43 Unspecified severe protein-calorie malnutrition: Secondary | ICD-10-CM | POA: Diagnosis not present

## 2015-06-01 LAB — CBC
HCT: 26.4 % — ABNORMAL LOW (ref 36.0–46.0)
Hemoglobin: 8.4 g/dL — ABNORMAL LOW (ref 12.0–15.0)
MCH: 26.3 pg (ref 26.0–34.0)
MCHC: 31.8 g/dL (ref 30.0–36.0)
MCV: 82.8 fL (ref 78.0–100.0)
PLATELETS: 136 10*3/uL — AB (ref 150–400)
RBC: 3.19 MIL/uL — ABNORMAL LOW (ref 3.87–5.11)
RDW: 14 % (ref 11.5–15.5)
WBC: 6.7 10*3/uL (ref 4.0–10.5)

## 2015-06-01 LAB — CBC AND DIFFERENTIAL: WBC: 6.7 10*3/mL

## 2015-06-01 MED ORDER — OXYCODONE HCL ER 10 MG PO T12A: 10.0000 mg | EXTENDED_RELEASE_TABLET | Freq: Two times a day (BID) | ORAL | Status: DC

## 2015-06-01 MED ORDER — OXYCODONE HCL 5 MG PO TABS: 5.0000 mg | ORAL_TABLET | ORAL | Status: DC | PRN

## 2015-06-01 MED ORDER — MAGNESIUM CITRATE PO SOLN
1.0000 | Freq: Once | ORAL | Status: AC
  Administered 2015-06-01: 1 via ORAL

## 2015-06-01 NOTE — Clinical Social Work Placement (Signed)
   CLINICAL SOCIAL WORK PLACEMENT  NOTE  Date:  06/01/2015  Patient Details  Name: Jessica Hawkins MRN: [id number] Date of Birth: [birth year]  Clinical Social Work is seeking post-discharge placement for this patient at the Skilled  Nursing Facility level of care (*CSW will initial, date and re-position this form in  chart as items are completed):  Yes   Patient/family provided with [facility] Clinical Social Work Department's list of facilities offering this level of care within the geographic area requested by the patient (or if unable, by the patient's family).  Yes   Patient/family informed of their freedom to choose among providers that offer the needed level of care, that participate in Medicare, Medicaid or managed care program needed by the patient, have an available bed and are willing to accept the patient.  Yes   Patient/family informed of [facility]'s ownership interest in [facility] and [facility], as well as of the fact that they are under no obligation to receive care at these facilities.  PASRR submitted to EDS on 05/31/15     PASRR number received on 05/31/15     Existing PASRR number confirmed on       FL2 transmitted to all facilities in geographic area requested by pt/family on 05/31/15     FL2 transmitted to all facilities within larger geographic area on       Patient informed that his/her managed care company has contracts with or will negotiate with certain facilities, including the following:        Yes   Patient/family informed of bed offers received.  [facility]     Physician [facility] and [facility]    [facility].  Patient to be transferred to facility by [facility]     Patient family notified on 06/01/15 of transfer.  Name of family member notified:  Lady GaryFern Chawla     PHYSICIAN Please prepare prescriptions, Please sign FL2     Additional Comment:     _______________________________________________ Rod MaeVaughn, Belanna Manring S, LCSW 06/01/2015, 12:36 PM

## 2015-06-01 NOTE — Progress Notes (Signed)
Orthopedic Tech Progress Note Patient Details:  Jessica Hawkins [birth year] [id number]  Patient ID: Jessica MunchPauline G Hawkins, female   DOB: [birth year], 82 y.o.   MRN: [id number] Applied cpm 0-60  Jessica Hawkins, Jessica Hawkins 06/01/2015, 5:50 AM

## 2015-06-01 NOTE — Progress Notes (Signed)
Report called to RN at [facility]. 

## 2015-06-01 NOTE — Addendum Note (Signed)
Addended by: Maurice SmallBEATTY, Earline Stiner C on: 06/01/2015 04:49 PM   Modules accepted: Orders

## 2015-06-01 NOTE — Clinical Social Work Note (Signed)
Patient ready for discharge from CSW standpoint. Patient's daughter expressed concern regarding patient requiring enema and bowel movement prior to discharge. CSW updated RNCM and RN.  Patient to be transported via EMS. EMS form completed and placed on patient's chart. RN to call for EMS [facility]) once medically stable for discharge.  [facility]: [phone]  Marcelline Deistmily Jordain Radin, [place] [phone] Orthopedics: [phone] [id number] Surgical: [phone] [id number]

## 2015-06-01 NOTE — Telephone Encounter (Signed)
Rx faxed to [facility] @ [phone], phone number [phone]  

## 2015-06-01 NOTE — Care Management Important Message (Signed)
Important Message  Patient Details  Name: Jessica Hawkins MRN: [id number] Date of Birth: 1922/06/25   Medicare Important Message Given:  Yes    Amazin Pincock P Nelson Noone 06/01/2015, 11:42 AM

## 2015-06-01 NOTE — Progress Notes (Signed)
Physical Therapy Treatment Patient Details Name: Gerhard Munchauline G Stephen MRN: [id number] DOB: February 28, 1923 Today's Date: 06/01/2015    History of Present Illness 82 yo admitted for L TKA. PMHx: incontinence, arthritis. bleeding d/o    PT Comments    Patient is making gradual progress but limited this session by incontinence. Daughter present for session. Current plan remains appropriate.   Follow Up Recommendations  SNF;Supervision/Assistance - 24 hour     Equipment Recommendations  None recommended by PT    Recommendations for Other Services       Precautions / Restrictions Precautions Precautions: Fall;Knee Restrictions Weight Bearing Restrictions: Yes LLE Weight Bearing: Weight bearing as tolerated    Mobility  Bed Mobility Overal bed mobility: Needs Assistance Bed Mobility: Supine to Sit     Supine to sit: Min assist     General bed mobility comments: assist to bring hips to EOB with use of bedpad; cues for technique and sequencing; use of bedrails and HOB elevated  Transfers Overall transfer level: Needs assistance Equipment used: Rolling walker (2 wheeled) Transfers: Sit to/from Stand Sit to Stand: Mod assist;+2 safety/equipment;+2 physical assistance Stand pivot transfers: Mod assist;+2 safety/equipment       General transfer comment: [facility]; step by step cues for hand placement and technique with assist to power up into standing and maintain balance upon stand with heavy posterior lean   Ambulation/Gait Ambulation/Gait assistance: Mod assist;+2 safety/equipment Ambulation Distance (Feet): 25 Feet Assistive device: Rolling walker (2 wheeled) Gait Pattern/deviations: Step-to pattern;Step-through pattern;Decreased stance time - left;Decreased step length - right;Leaning posteriorly;Trunk flexed     General Gait Details: pt with posterior lean throughout; multimodal cues for posture and position of RW; ability to correct posture with max cues but  continues posterior lean   Stairs            Wheelchair Mobility    Modified Rankin (Stroke Patients Only)       Balance     Sitting balance-Leahy Scale: Fair       Standing balance-Leahy Scale: Poor                      Cognition Arousal/Alertness: Awake/alert Behavior During Therapy: WFL for tasks assessed/performed Overall Cognitive Status: Within Functional Limits for tasks assessed                      Exercises      General Comments General comments (skin integrity, edema, etc.): pt incontinent during session X 2      Pertinent Vitals/Pain Pain Assessment: No/denies pain Pain Intervention(s): Monitored during session;Premedicated before session;Repositioned    Home Living                      Prior Function            PT Goals (current goals can now be found in the care plan section) Acute Rehab PT Goals Patient Stated Goal: none stated Progress towards PT goals: Progressing toward goals    Frequency  7X/week    PT Plan Current plan remains appropriate    Co-evaluation             End of Session Equipment Utilized During Treatment: Gait belt Activity Tolerance: Patient tolerated treatment well Patient left: in chair;with call bell/phone within reach;with family/visitor present     Time: 0825-0903 PT Time Calculation (min) (ACUTE ONLY): 38 min  Charges:  $Gait Training: 8-22 mins $Therapeutic Activity:  23-37 mins                    G Codes:      Derek Mound, PTA Pager: [phone]   06/01/2015, 9:15 AM

## 2015-06-02 ENCOUNTER — Telehealth: Payer: Self-pay | Admitting: Internal Medicine

## 2015-06-02 ENCOUNTER — Non-Acute Institutional Stay (SKILLED_NURSING_FACILITY): Payer: Medicare Other | Admitting: Internal Medicine

## 2015-06-02 ENCOUNTER — Encounter: Payer: Self-pay | Admitting: Internal Medicine

## 2015-06-02 DIAGNOSIS — N289 Disorder of kidney and ureter, unspecified: Secondary | ICD-10-CM

## 2015-06-02 DIAGNOSIS — R2681 Unsteadiness on feet: Secondary | ICD-10-CM

## 2015-06-02 DIAGNOSIS — E119 Type 2 diabetes mellitus without complications: Secondary | ICD-10-CM | POA: Diagnosis not present

## 2015-06-02 DIAGNOSIS — M1712 Unilateral primary osteoarthritis, left knee: Secondary | ICD-10-CM

## 2015-06-02 DIAGNOSIS — K5900 Constipation, unspecified: Secondary | ICD-10-CM | POA: Diagnosis not present

## 2015-06-02 DIAGNOSIS — D62 Acute posthemorrhagic anemia: Secondary | ICD-10-CM | POA: Diagnosis not present

## 2015-06-02 DIAGNOSIS — R600 Localized edema: Secondary | ICD-10-CM

## 2015-06-02 DIAGNOSIS — I10 Essential (primary) hypertension: Secondary | ICD-10-CM

## 2015-06-02 DIAGNOSIS — R300 Dysuria: Secondary | ICD-10-CM

## 2015-06-02 DIAGNOSIS — N3281 Overactive bladder: Secondary | ICD-10-CM

## 2015-06-02 DIAGNOSIS — E039 Hypothyroidism, unspecified: Secondary | ICD-10-CM

## 2015-06-02 NOTE — Telephone Encounter (Signed)
This is a patient of [facility] who was admitted to **[facility]. Hospital discharge **06/01/15**

## 2015-06-02 NOTE — Progress Notes (Signed)
LOCATION: [facility]  PCP: Kimber RelicGREEN, ARTHUR G, MD   Code Status: Full Code  Goals of care: Advanced Directive information Advanced Directives 05/24/2015  Does patient have an advance directive? Yes  Type of Advance Directive Healthcare Power of Attorney  Does patient want to make changes to advanced directive? No - Patient declined  Copy of advanced directive(s) in chart? Yes       Extended Emergency Contact Information Primary Emergency Contact: Tafoya,[name] Address: [address]          [place], [place] [id number] [place] Home Phone: [phone] Mobile Phone: [phone] Relation: Daughter Secondary Emergency Contact: Sherryl BartersJones,Ruth Address: [address]          [place], [place] [id number] [place] Home Phone: [phone] Mobile Phone: [phone] Relation: Daughter   No Known Allergies  Chief Complaint  Patient presents with  . Readmit To SNF    Readmission     HPI:  Patient is a 82 y.o. female seen today for short term rehabilitation post hospital admission from 05/29/15-06/01/15 with primary OA of her left knee. She underwent left total knee arthroplasty. She is seen in her room today. She complaints of pain to her knee. She has not asked for her rescue pain medications.   Review of Systems:  Constitutional: Negative for fever, chills, diaphoresis. Energy is slowly coming back.  HENT: Negative for headache, congestion, nasal discharge.   Eyes: Negative for blurred vision, double vision and discharge.  Respiratory: Negative for cough, shortness of breath and wheezing.   Cardiovascular: Negative for chest pain, palpitations, leg swelling.  Gastrointestinal: Negative for heartburn, nausea, vomiting, abdominal pain. Last bowel movement was yesterday.  Genitourinary: Negative for flank pain. Positive for some burning with urination.  Musculoskeletal: Negative for back pain, fall in the facility.  Skin: Negative for itching,  rash.  Neurological: Negative for dizziness. Psychiatric/Behavioral: Negative for depression   Past Medical History  Diagnosis Date  . Hypertension   . Osteoporosis   . GERD (gastroesophageal reflux disease) 06/29/2011  . Hypothyroid 06/29/2011  . HTN (hypertension) 06/29/2011  . Platelet disorder (HCC)     ? Von Willebrand; abnormal PLT function assay 07/04/11   . Hyperlipidemia   . Diabetes mellitus without complication (HCC)   . Edema   . BBB (bundle branch block)     RT  . Hiatal hernia   . Fibrocystic breast   . Arthritis     Osteoarthritis  . Vertigo   . Insomnia   . Gait disorder   . Unspecified constipation   . Disturbance of skin sensation   . Unspecified vitamin D deficiency   . Unspecified urinary incontinence   . Unspecified sleep apnea   . Coronary atherosclerosis of unspecified type of vessel, native or graft   . Diaphragmatic hernia without mention of obstruction or gangrene   . Debility, unspecified   . History of transfusion of packed red blood cells   . Anginal pain [facility](HCC)     'i haven't had them in a long time but i used to'   Past Surgical History  Procedure Laterality Date  . Hip surgery Right 2002    ORIF  . Thyroidectomy  1952  . Tonsillectomy    . Femur im nail  06/29/2011    Procedure: INTRAMEDULLARY (IM) NAIL FEMORAL;  Surgeon: Verlee RossettiSteven R Norris, MD;  [facility];  Service: Orthopedics;  Laterality: Left;  . Rotator cuff repair Left 1999  Tear  . Eye surgery Right 2005    Cataract surgery  . Eye surgery Left 2006    Cataract surgery  . Total knee arthroplasty Left 05/29/2015    Procedure: TOTAL KNEE ARTHROPLASTY;  Surgeon: Dannielle Huh, MD;  Location: [facility] OR;  Service: Orthopedics;  Laterality: Left;   Social History:   reports that she has never smoked. She has never used smokeless tobacco. She reports that she does not drink alcohol or use illicit drugs.  Family History  Problem Relation Age of Onset  . Cancer Mother     Stomach  .  Cancer Father     Prostate  . Emphysema Father   . Alcohol abuse Sister   . Liver disease Sister   . Kidney disease Brother   . Hypertension Daughter   . Cancer Brother     Prostate, Bladder  . Emphysema Brother   . Cancer Brother     Prostate  . Arthritis Daughter     Knees  . Heart murmur Son   . Mental illness Son     Autism    Medications:   Medication List       This list is accurate as of: 06/02/15  3:28 PM.  Always use your most recent med list.               AMBULATORY NON FORMULARY MEDICATION  Knee High Compression Hose with Zippers for both legs. Diagnosis M25.561, R60.9, I87.2     aspirin EC 81 MG tablet  Take 81 mg by mouth daily.     BAYER MICROLET LANCETS lancets  Use as instructed to check blood sugar once daily.250.00     furosemide 40 MG tablet  Commonly known as:  LASIX  Take one tablet by mouth once daily for edema     glucose blood test strip  Test blood sugar once daily     glucose blood test strip  Commonly known as:  BAYER CONTOUR TEST  Use as instructed to check blood sugar once daily. 250.00     levothyroxine 25 MCG tablet  Commonly known as:  SYNTHROID, LEVOTHROID  Take one tablet by mouth once daily for thyroid supplement     losartan 50 MG tablet  Commonly known as:  COZAAR  Take one tablet by mouth once daily to control blood pressure     methocarbamol 500 MG tablet  Commonly known as:  ROBAXIN  Take 1 tablet (500 mg total) by mouth every 6 (six) hours as needed for muscle spasms.     multivitamin-iron-minerals-folic acid chewable tablet  Chew 1 tablet by mouth daily.     ondansetron 4 MG tablet  Commonly known as:  ZOFRAN  Take 1 tablet (4 mg total) by mouth every 6 (six) hours as needed for nausea.     oxyCODONE 5 MG immediate release tablet  Commonly known as:  Oxy IR/ROXICODONE  Take 1 tablet (5 mg total) by mouth every 3 (three) hours as needed for breakthrough pain.     oxyCODONE 10 mg 12 hr tablet  Commonly  known as:  OXYCONTIN  Take 1 tablet (10 mg total) by mouth every 12 (twelve) hours.     rivaroxaban 10 MG Tabs tablet  Commonly known as:  XARELTO  Take 1 tablet (10 mg total) by mouth daily.     tolterodine 4 MG 24 hr capsule  Commonly known as:  DETROL LA  Take one capsule by mouth once daily for bladder control  Immunizations: Immunization History  Administered Date(s) Administered  . Influenza,inj,Quad PF,36+ Mos 12/16/2012, 12/01/2013, 12/07/2014  . Influenza-Unspecified 12/23/2008, 10/31/2010, 11/14/2011  . PPD Test 04/18/2015, 06/01/2015  . Pneumococcal Conjugate-13 12/07/2014  . Pneumococcal-Unspecified 01/02/1998  . Tdap 03/13/2011  . Zoster 01/31/2009     Physical Exam: Filed Vitals:   06/02/15 1517  BP: 123/62  Pulse: 86  Temp: 98.6 F (37 C)  TempSrc: Oral  Resp: 16  Height: 5\' 8"  (1.727 m)  Weight: 145 lb 12.8 oz (66.134 kg)  SpO2: 95%   Body mass index is 22.17 kg/(m^2).  General- elderly female, thin built, in no acute distress Head- normocephalic, atraumatic Nose- no maxillary or frontal sinus tenderness, no nasal discharge Throat- moist mucus membrane , poor dentition Eyes- PERRLA, EOMI, no pallor, no icterus, no discharge, normal conjunctiva, normal sclera Neck- no cervical lymphadenopathy Cardiovascular- normal s1,s2, no murmur, trace right and 1+ left leg edema Respiratory- bilateral clear to auscultation, no wheeze, no rhonchi, no crackles, no use of accessory muscles Abdomen- bowel sounds present, soft, non tender Musculoskeletal- able to move all 4 extremities, generalized weakness, limited left knee ROM Neurological- alert and oriented to person, place and time Skin- warm and dry Psychiatry- normal mood and affect    Labs reviewed: Basic Metabolic Panel:  Recent Labs  16/10/96 0819 05/19/15 1555 05/22/15 1559 05/30/15 05/30/15 0552  NA 140 141 140 135* 135  K 4.3 4.1 4.1  --  3.7  CL 100 103  --   --  100*  CO2 25 27  30*  --  27  GLUCOSE 102* 96 186*  --  151*  BUN 39* 27* 33.3* 18 18  CREATININE 1.00 0.89 1.1 1.1 1.07*  CALCIUM 9.4 9.7 9.8  --  8.4*   Liver Function Tests:  Recent Labs  12/02/14 0819 05/19/15 1555 05/22/15 1559  AST 20 27 20   ALT 13 14 13   ALKPHOS 83 76 83  BILITOT 0.4 0.3 0.36  PROT 7.2 7.7 7.8  ALBUMIN 4.0 3.6 3.6   No results for input(s): LIPASE, AMYLASE in the last 8760 hours. No results for input(s): AMMONIA in the last 8760 hours. CBC:  Recent Labs  05/19/15 1555 05/22/15 1559 05/30/15 0552 05/31/15 0415 06/01/15 06/01/15 0338  WBC 4.8 4.4 7.4 8.8 6.7 6.7  NEUTROABS 2.2 2.4  --   --   --   --   HGB 11.7* 12.0 10.1* 9.7*  --  8.4*  HCT 37.5 36.8 31.3* 29.5*  --  26.4*  MCV 84.5 83.6 84.1 83.1  --  82.8  PLT 183 193 150 134*  --  136*   Cardiac Enzymes: No results for input(s): CKTOTAL, CKMB, CKMBINDEX, TROPONINI in the last 8760 hours. BNP: Invalid input(s): POCBNP CBG:  Recent Labs  05/29/15 0950 05/29/15 1251 05/29/15 1635  GLUCAP 87 109* 115*    Radiological Exams: Dg Chest 2 View  05/19/2015  CLINICAL DATA:  Preoperative evaluation for knee arthroplasty. Hypertension. EXAM: CHEST  2 VIEW COMPARISON:  May 12, 2013 FINDINGS: There is no edema or consolidation. Heart is upper normal in size with pulmonary vascularity within normal limits. There is atherosclerotic calcification in aorta with mild aortic tortuosity, stable. There is postoperative change in the left shoulder. IMPRESSION: No edema or consolidation.  Stable cardiac silhouette. Electronically Signed   By: Bretta Bang III M.D.   On: 05/19/2015 16:30   Dg Abd Portable 1v  06/01/2015  CLINICAL DATA:  82 year old female with constipation. Initial encounter. EXAM: PORTABLE  ABDOMEN - 1 VIEW COMPARISON:  CT Abdomen and Pelvis 08/25/2012. FINDINGS: 2 portable supine views of the abdomen at 1337 hours. Non obstructed bowel gas pattern. Redundant colon with moderate volume of retained  stool. No definite pneumoperitoneum on these supine views. Moderate dextro convex lumbar scoliosis. Partially visible bilateral proximal femur chronic ORIF hardware. Negative visualized lung bases. Calcified aortic atherosclerosis. IMPRESSION: Non obstructed bowel gas pattern with redundant colon and moderate volume of retained stool. Electronically Signed   By: Odessa Fleming M.D.   On: 06/01/2015 13:48    Assessment/Plan  Unsteady gait With left knee surgery. Will have patient work with PT/OT as tolerated to regain strength and restore function.  Fall precautions are in place.  Left knee OA S/p left total knee arthroplasty. Will have her work with physical therapy and occupational therapy team to help with gait training and muscle strengthening exercises.fall precautions. Skin care. Encourage to be out of bed. Continue oxycontin 10 mg bid with oxyIR 5 mg q8h prn pain. Continue robaxin 500 m q6h prn muscle spasm. Continue xarelto for dvt prophylaxis. Has follow up with orthopedics. Get physiatry consult  Blood loss anemia Post op, monitor cbc  Hypothyroidism Continue synthroid, no changes made  HTN Monitor bp. Continue lasix 40 mg daily and losartan 50 mg daily. Check bmp  Leg edema Continue lasix 40 mg daily, add ted hose, check bmp  Impaired renal function Check bmp  Dysuria Send u/a with c/s. Hydration encouraged  Constipation prophylaxis On scheduled narcotic. Add colace 100 mg bid to prevent constipation  DM Lab Results  Component Value Date   HGBA1C 6.1* 05/19/2015  monitor cbg, off all meds at present  OAB Continue detrol    Goals of care: short term rehabilitation   Labs/tests ordered: cbc, cmp  Family/ staff Communication: reviewed care plan with patient and nursing supervisor    Oneal Grout, MD Internal Medicine [facility] [address] [place], [place] [address] Cell Phone (Monday-Friday 8 am - 5 pm): [phone] On  Call: [phone] and follow prompts after 5 pm and on weekends Office Phone: [phone] Office Fax: [phone]

## 2015-06-05 LAB — CBC AND DIFFERENTIAL
HEMATOCRIT: 25 % — AB (ref 36–46)
Hemoglobin: 7.8 g/dL — AB (ref 12.0–16.0)
NEUTROS ABS: 3 /uL
Platelets: 238 10*3/uL (ref 150–399)
WBC: 5.3 10*3/mL

## 2015-06-05 LAB — HEPATIC FUNCTION PANEL
ALK PHOS: 62 U/L (ref 25–125)
ALT: 12 U/L (ref 7–35)
AST: 18 U/L (ref 13–35)
BILIRUBIN, TOTAL: 0.9 mg/dL

## 2015-06-05 LAB — BASIC METABOLIC PANEL
BUN: 24 mg/dL — AB (ref 4–21)
Creatinine: 0.9 mg/dL (ref 0.5–1.1)
GLUCOSE: 115 mg/dL
Potassium: 5.3 mmol/L (ref 3.4–5.3)
SODIUM: 136 mmol/L — AB (ref 137–147)

## 2015-06-06 ENCOUNTER — Encounter: Payer: Self-pay | Admitting: Adult Health

## 2015-06-06 ENCOUNTER — Non-Acute Institutional Stay (SKILLED_NURSING_FACILITY): Payer: Medicare Other | Admitting: Adult Health

## 2015-06-06 DIAGNOSIS — E43 Unspecified severe protein-calorie malnutrition: Secondary | ICD-10-CM | POA: Diagnosis not present

## 2015-06-06 DIAGNOSIS — D62 Acute posthemorrhagic anemia: Secondary | ICD-10-CM

## 2015-06-06 NOTE — Progress Notes (Signed)
Patient ID: Jessica Hawkins, female   DOB: Sep 06, 1922, 82 y.o.   MRN: [id number]    DATE:    06/06/15  MRN:  [id number]  BIRTHDAY: 09-Jun-1922  Facility:  Nursing Home Location:  [facility]  Nursing Home Room Number: [id number]  LEVEL OF CARE:  SNF [phone])  Contact Information    Name Relation Home Work Mobile   Hawkins,Jessica Daughter [phone]  [phone]   Jessica, Hawkins Daughter [phone]  [phone]   Jessica, Hawkins [phone]     Jessica, Hawkins [phone]     Jessica Hawkins Daughter [phone]  [phone]   Jessica, Hawkins [phone]  [phone]       Code Status History    Date Active Date Inactive Code Status Order ID Comments User Context   06/29/2011 11:49 PM 07/08/2011  4:24 PM Full Code [id number]  Troy Sine, RN Inpatient       Chief Complaint  Patient presents with  . Acute Visit    Protein calorie malnutrition, anemia    HISTORY OF PRESENT ILLNESS:  This is a 82 year old female who was noted to have hgb 7.8. No complaints of dizziness. She was, also, noted to have albumin 2.85.   She has been admitted to [facility] on 06/01/15 from [facility] for osteoarthritis of left knee  for which she had left total knee arthroplasty. She had been admitted for a short-term rehabilitation.  PAST MEDICAL HISTORY:  Past Medical History  Diagnosis Date  . Hypertension 06/29/11  . Osteoporosis   . GERD (gastroesophageal reflux disease) 06/29/2011  . Hypothyroid 06/29/2011  . Platelet disorder (HCC)     ? Von Willebrand; abnormal PLT function assay 07/04/11   . Hyperlipidemia   . Diabetes mellitus without complication (HCC)     Diet controlled  . Edema   . BBB (bundle branch block)     RT  . Hiatal hernia   . Fibrocystic breast   . Arthritis     Osteoarthritis  . Vertigo   . Insomnia   . Gait disorder   . Unspecified constipation   . Disturbance of skin sensation   . Unspecified vitamin D deficiency   . Unspecified urinary  incontinence   . Unspecified sleep apnea   . Coronary atherosclerosis of unspecified type of vessel, native or graft   . Diaphragmatic hernia without mention of obstruction or gangrene   . Debility, unspecified   . History of transfusion of packed red blood cells   . Anginal pain [facility])     'i haven't had them in a long time but i used to'  . Unsteady gait   . Acute blood loss anemia   . OAB (overactive bladder)      CURRENT MEDICATIONS: Reviewed  Patient's Medications  New Prescriptions   No medications on file  Previous Medications   AMBULATORY NON FORMULARY MEDICATION    Knee High Compression Hose with Zippers for both legs. Diagnosis M25.561, R60.9, I87.2   ASPIRIN EC 81 MG TABLET    Take 81 mg by mouth daily.   BAYER MICROLET LANCETS LANCETS    Use as instructed to check blood sugar once daily.250.00   FERROUS SULFATE 325 (65 FE) MG TABLET    Take 325 mg by mouth 2 (two) times daily with a meal.   FUROSEMIDE (LASIX) 40 MG TABLET    Take one tablet by mouth once daily for edema   GLUCOSE BLOOD (BAYER CONTOUR TEST) TEST STRIP    Use as instructed  to check blood sugar once daily. 250.00   GLUCOSE BLOOD TEST STRIP    Reported on 06/09/2015   LEVOTHYROXINE (SYNTHROID, LEVOTHROID) 25 MCG TABLET    Take one tablet by mouth once daily for thyroid supplement   LOSARTAN (COZAAR) 50 MG TABLET    Take one tablet by mouth once daily to control blood pressure   METHOCARBAMOL (ROBAXIN) 500 MG TABLET    Take 1 tablet (500 mg total) by mouth every 6 (six) hours as needed for muscle spasms.   MULTIVITAMIN-IRON-MINERALS-FOLIC ACID (CENTRUM) CHEWABLE TABLET    Chew 1 tablet by mouth daily.   ONDANSETRON (ZOFRAN) 4 MG TABLET    Take 1 tablet (4 mg total) by mouth every 6 (six) hours as needed for nausea.   OXYCODONE (OXY-IR) 5 MG CAPSULE    Take 5 mg by mouth every 3 (three) hours as needed   OXYCODONE (OXYCONTIN) 10 MG 12 HR TABLET    Take 10 mg by mouth daily.   POLYETHYLENE GLYCOL (MIRALAX /  GLYCOLAX) PACKET    Take 17 g by mouth 2 (two) times daily. Hold for loose stools   PROTEIN (PROCEL) POWD    Take 2 scoop by mouth 2 (two) times daily.   RIVAROXABAN (XARELTO) 10 MG TABS TABLET    Take 1 tablet (10 mg total) by mouth daily.   SENNOSIDES-DOCUSATE SODIUM (SENOKOT-S) 8.6-50 MG TABLET    Take 2 tablets by mouth 2 (two) times daily. Hold for loose stools   TOLTERODINE (DETROL LA) 4 MG 24 HR CAPSULE    Take one capsule by mouth once daily for bladder control                   No Known Allergies   REVIEW OF SYSTEMS:  GENERAL: no change in appetite, no fatigue, no weight changes, no fever, chills or weakness EYES: Denies change in vision, dry eyes, eye pain, itching or discharge EARS: Denies change in hearing, ringing in ears, or earache NOSE: Denies nasal congestion or epistaxis MOUTH and THROAT: Denies oral discomfort, gingival pain or bleeding, pain from teeth or hoarseness   RESPIRATORY: no cough, SOB, DOE, wheezing, hemoptysis CARDIAC: no chest pain, edema or palpitations GI: no abdominal pain, diarrhea, constipation, heart burn, nausea or vomiting GU: Denies dysuria, frequency, hematuria, incontinence, or discharge PSYCHIATRIC: Denies feeling of depression or anxiety. No report of hallucinations, insomnia, paranoia, or agitation   PHYSICAL EXAMINATION  GENERAL APPEARANCE: Well nourished. In no acute distress. Normal body habitus SKIN:  Left knee surgical incision is dry, covered with dry dressing and ACE wrap HEAD: Normal in size and contour. No evidence of trauma EYES: Lids open and close normally. No blepharitis, entropion or ectropion. PERRL. Conjunctivae are clear and sclerae are white. Lenses are without opacity EARS: Pinnae are normal. Patient hears normal voice tunes of the examiner MOUTH and THROAT: Lips are without lesions. Oral mucosa is moist and without lesions. Tongue is normal in shape, size, and color and without lesions NECK: supple, trachea  midline, no neck masses, no thyroid tenderness, no thyromegaly LYMPHATICS: no LAN in the neck, no supraclavicular LAN RESPIRATORY: breathing is even & unlabored, BS CTAB CARDIAC: RRR, no murmur,no extra heart sounds, no edema GI: abdomen soft, normal BS, no masses, no tenderness, no hepatomegaly, no splenomegaly EXTREMITIES:  Able to move X 4 extremities PSYCHIATRIC: Alert and oriented X 3. Affect and behavior are appropriate  LABS/RADIOLOGY: Labs reviewed: Basic Metabolic Panel:  Recent Labs  09/81/1909/30/16 0819 05/19/15 1555  05/22/15 1559 05/30/15 05/30/15 0552 06/05/15  NA 140 141 140 135* 135 136*  K 4.3 4.1 4.1  --  3.7 5.3  CL 100 103  --   --  100*  --   CO2 25 27 30*  --  27  --   GLUCOSE 102* 96 186*  --  151*  --   BUN 39* 27* 33.3* 18 18 24*  CREATININE 1.00 0.89 1.1 1.1 1.07* 0.9  CALCIUM 9.4 9.7 9.8  --  8.4*  --    Liver Function Tests:  Recent Labs  12/02/14 0819 05/19/15 1555 05/22/15 1559 06/05/15  AST ALT ALKPHOS 83 76 83 62  BILITOT 0.4 0.3 0.36  --   PROT 7.2 7.7 7.8  --   ALBUMIN 4.0 3.6 3.6  --    CBC:  Recent Labs  05/19/15 1555 05/22/15 1559 05/30/15 0552 05/31/15 0415 06/01/15 06/01/15 0338 06/05/15  WBC 4.8 4.4 7.4 8.8 6.7 6.7 5.3  NEUTROABS 2.2 2.4  --   --   --   --  3  HGB 11.7* 12.0 10.1* 9.7*  --  8.4* 7.8*  HCT 37.5 36.8 31.3* 29.5*  --  26.4* 25*  MCV 84.5 83.6 84.1 83.1  --  82.8  --   PLT 183 193 150 134*  --  136* 238   CBG:  Recent Labs  05/29/15 0950 05/29/15 1251 05/29/15 1635  GLUCAP 87 109* 115*      Dg Chest 2 View  05/19/2015  CLINICAL DATA:  Preoperative evaluation for knee arthroplasty. Hypertension. EXAM: CHEST  2 VIEW COMPARISON:  May 12, 2013 FINDINGS: There is no edema or consolidation. Heart is upper normal in size with pulmonary vascularity within normal limits. There is atherosclerotic calcification in aorta with mild aortic tortuosity, stable. There is postoperative change  in the left shoulder. IMPRESSION: No edema or consolidation.  Stable cardiac silhouette. Electronically Signed   By: Bretta Bang III M.D.   On: 05/19/2015 16:30   Dg Abd Portable 1v  06/01/2015  CLINICAL DATA:  82 year old female with constipation. Initial encounter. EXAM: PORTABLE ABDOMEN - 1 VIEW COMPARISON:  CT Abdomen and Pelvis 08/25/2012. FINDINGS: 2 portable supine views of the abdomen at 1337 hours. Non obstructed bowel gas pattern. Redundant colon with moderate volume of retained stool. No definite pneumoperitoneum on these supine views. Moderate dextro convex lumbar scoliosis. Partially visible bilateral proximal femur chronic ORIF hardware. Negative visualized lung bases. Calcified aortic atherosclerosis. IMPRESSION: Non obstructed bowel gas pattern with redundant colon and moderate volume of retained stool. Electronically Signed   By: Odessa Fleming M.D.   On: 06/01/2015 13:48    ASSESSMENT/PLAN:  Anemia, acute blood loss - hgb 7.8; start FeSO4 325 mg 1 tab PO BID; CBC in 1 week  Protein-calorie malnutrition, severe - albumin 2.85; start Procel 2 scoops PO BID; RD consult     MEDINA-VARGAS,Natia Fahmy, NP [employer] [phone]

## 2015-06-08 ENCOUNTER — Telehealth: Payer: Self-pay | Admitting: *Deleted

## 2015-06-08 NOTE — Telephone Encounter (Addendum)
Daughter Lady GaryFern Robards, calling with multiple questions for Dr. Glade LloydPandey, daughter wanted to know that wound care is being done like it was in the hospital, she has concerns about physical therapy if and when it's being done and what is being done? She wanted to know if pt is still on pain medication? Which I did explain pt is taking Oxycodone 10mg  12hr one tablet twice daily ( she said it should be once daily) and also Oxycodone 5mg  1 tablet three times daily as needed for breakthrough pain.   I told daughter to call facility to address concerns with nursing staff, per daughter she has done that and only talked to staff and not Dr. Glade LloydPandey.

## 2015-06-09 ENCOUNTER — Non-Acute Institutional Stay (SKILLED_NURSING_FACILITY): Payer: Medicare Other | Admitting: Adult Health

## 2015-06-09 ENCOUNTER — Encounter: Payer: Self-pay | Admitting: Adult Health

## 2015-06-09 DIAGNOSIS — M1712 Unilateral primary osteoarthritis, left knee: Secondary | ICD-10-CM | POA: Diagnosis not present

## 2015-06-09 DIAGNOSIS — E039 Hypothyroidism, unspecified: Secondary | ICD-10-CM | POA: Diagnosis not present

## 2015-06-09 DIAGNOSIS — R600 Localized edema: Secondary | ICD-10-CM | POA: Diagnosis not present

## 2015-06-09 DIAGNOSIS — E119 Type 2 diabetes mellitus without complications: Secondary | ICD-10-CM

## 2015-06-09 DIAGNOSIS — N3281 Overactive bladder: Secondary | ICD-10-CM | POA: Diagnosis not present

## 2015-06-09 DIAGNOSIS — K5900 Constipation, unspecified: Secondary | ICD-10-CM

## 2015-06-09 DIAGNOSIS — E43 Unspecified severe protein-calorie malnutrition: Secondary | ICD-10-CM

## 2015-06-09 DIAGNOSIS — I10 Essential (primary) hypertension: Secondary | ICD-10-CM | POA: Diagnosis not present

## 2015-06-09 DIAGNOSIS — D62 Acute posthemorrhagic anemia: Secondary | ICD-10-CM | POA: Diagnosis not present

## 2015-06-09 NOTE — Progress Notes (Signed)
Patient ID: Jessica MunchPauline G Hawkins, female   DOB: [birth year], 82 y.o.   MRN: [id number]    DATE:  06/09/2015   MRN:  [id number]  BIRTHDAY: [birth year]  Facility:  Nursing Home Location:  [facility]  Nursing Home Room Number: 702-P  LEVEL OF CARE:  SNF [phone]  Contact Information    Name Relation Home Work Mobile   Brougher,[name] Daughter [phone]  [phone]   Sherryl BartersJones,Ruth Daughter [phone]  [phone]   Conley CanalJones,Wagner Son [phone]     Lilyan GilfordJones,Josiah Son [phone]     Flora LippsBanks,Pamela Daughter [phone]  [phone]   Lilyan GilfordJones,Josiah Son [phone]  [phone]       Code Status History    Date Active Date Inactive Code Status Order ID Comments User Context   06/29/2011 11:49 PM 07/08/2011  4:24 PM Full Code [id number]  Troy SineAleasha M Walker, RN Inpatient       Chief Complaint  Patient presents with  . Discharge Note    HISTORY OF PRESENT ILLNESS:  This is a 82 year old female who is for discharge home with Home health PT and OT. [facility] has been [facility] for osteoarthritis of left knee  for which she had left total knee arthroplasty.  Patient was admitted to this facility for short-term rehabilitation after the patient's recent hospitalization.  Patient has completed SNF rehabilitation and therapy has cleared the patient for discharge.  PAST MEDICAL HISTORY:  Past Medical History  Diagnosis Date  . Hypertension 06/29/11  . Osteoporosis   . GERD (gastroesophageal reflux disease) 06/29/2011  . Hypothyroid 06/29/2011  . Platelet disorder (HCC)     ? Von Willebrand; abnormal PLT function assay 07/04/11   . Hyperlipidemia   . Diabetes mellitus without complication (HCC)     Diet controlled  . Edema   . BBB (bundle branch block)     RT  . Hiatal hernia   . Fibrocystic breast   . Arthritis     Osteoarthritis  . Vertigo   . Insomnia   . Gait disorder   . Unspecified constipation   . Disturbance of skin sensation    . Unspecified vitamin D deficiency   . Unspecified urinary incontinence   . Unspecified sleep apnea   . Coronary atherosclerosis of unspecified type of vessel, native or graft   . Diaphragmatic hernia without mention of obstruction or gangrene   . Debility, unspecified   . History of transfusion of packed red blood cells   . Anginal pain [facility](HCC)     'i haven't had them in a long time but i used to'  . Unsteady gait   . Acute blood loss anemia   . OAB (overactive bladder)      CURRENT MEDICATIONS: Reviewed  Patient's Medications  New Prescriptions   No medications on file  Previous Medications   AMBULATORY NON FORMULARY MEDICATION    Knee High Compression Hose with Zippers for both legs. Diagnosis M25.561, R60.9, I87.2   ASPIRIN EC 81 MG TABLET    Take 81 mg by mouth daily.   BAYER MICROLET LANCETS LANCETS    Use as instructed to check blood sugar once daily.250.00   FERROUS SULFATE 325 (65 FE) MG TABLET    Take 325 mg by mouth 2 (two) times daily with a meal.   FUROSEMIDE (LASIX) 40 MG TABLET    Take one tablet by mouth once daily for edema   GLUCOSE BLOOD (BAYER CONTOUR TEST) TEST STRIP  Use as instructed to check blood sugar once daily. 250.00   GLUCOSE BLOOD TEST STRIP    Reported on 06/09/2015   LEVOTHYROXINE (SYNTHROID, LEVOTHROID) 25 MCG TABLET    Take one tablet by mouth once daily for thyroid supplement   LOSARTAN (COZAAR) 50 MG TABLET    Take one tablet by mouth once daily to control blood pressure   METHOCARBAMOL (ROBAXIN) 500 MG TABLET    Take 1 tablet (500 mg total) by mouth every 6 (six) hours as needed for muscle spasms.   MULTIVITAMIN-IRON-MINERALS-FOLIC ACID (CENTRUM) CHEWABLE TABLET    Chew 1 tablet by mouth daily.   ONDANSETRON (ZOFRAN) 4 MG TABLET    Take 1 tablet (4 mg total) by mouth every 6 (six) hours as needed for nausea.   OXYCODONE (OXY-IR) 5 MG CAPSULE    Take 5 mg by mouth every 3 (three) hours as needed.    POLYETHYLENE GLYCOL (MIRALAX / GLYCOLAX) PACKET     Take 17 g by mouth 2 (two) times daily. Hold for loose stools   PROTEIN (PROCEL) POWD    Take 2 scoop by mouth 2 (two) times daily.   RIVAROXABAN (XARELTO) 10 MG TABS TABLET    Take 1 tablet (10 mg total) by mouth daily.   SENNOSIDES-DOCUSATE SODIUM (SENOKOT-S) 8.6-50 MG TABLET    Take 2 tablets by mouth 2 (two) times daily. Hold for loose stools   TOLTERODINE (DETROL LA) 4 MG 24 HR CAPSULE    Take one capsule by mouth once daily for bladder control  Modified Medications   No medications on file  Discontinued Medications   OXYCODONE (OXY IR/ROXICODONE) 5 MG IMMEDIATE RELEASE TABLET    Take 1 tablet (5 mg total) by mouth every 3 (three) hours as needed for breakthrough pain.   OXYCODONE (OXYCONTIN) 10 MG 12 HR TABLET    Take 1 tablet (10 mg total) by mouth every 12 (twelve) hours.   OXYCODONE (OXYCONTIN) 10 MG 12 HR TABLET    Take 10 mg by mouth daily.     No Known Allergies   REVIEW OF SYSTEMS:  GENERAL: no change in appetite, no fatigue, no weight changes, no fever, chills or weakness EYES: Denies change in vision, dry eyes, eye pain, itching or discharge EARS: Denies change in hearing, ringing in ears, or earache NOSE: Denies nasal congestion or epistaxis MOUTH and THROAT: Denies oral discomfort, gingival pain or bleeding, pain from teeth or hoarseness   RESPIRATORY: no cough, SOB, DOE, wheezing, hemoptysis CARDIAC: no chest pain,  or palpitations GI: no abdominal pain, diarrhea, constipation, heart burn, nausea or vomiting GU: Denies dysuria, frequency, hematuria, incontinence, or discharge PSYCHIATRIC: Denies feeling of depression or anxiety. No report of hallucinations, insomnia, paranoia, or agitation   PHYSICAL EXAMINATION  GENERAL APPEARANCE: Well nourished. In no acute distress. Normal body habitus SKIN:  Left knee surgical incision is dry, no erythema HEAD: Normal in size and contour. No evidence of trauma EYES: Lids open and close normally. No blepharitis, entropion  or ectropion. PERRL. Conjunctivae are clear and sclerae are white. Lenses are without opacity EARS: Pinnae are normal. Patient hears normal voice tunes of the examiner MOUTH and THROAT: Lips are without lesions. Oral mucosa is moist and without lesions. Tongue is normal in shape, size, and color and without lesions NECK: supple, trachea midline, no neck masses, no thyroid tenderness, no thyromegaly LYMPHATICS: no LAN in the neck, no supraclavicular LAN RESPIRATORY: breathing is even & unlabored, BS CTAB CARDIAC: RRR, no murmur,no extra  heart sounds, RLE edema trace and LLE edema 1+ GI: abdomen soft, normal BS, no masses, no tenderness, no hepatomegaly, no splenomegaly EXTREMITIES:  Able to move X 4 extremities PSYCHIATRIC: Alert and oriented X 3. Affect and behavior are appropriate  LABS/RADIOLOGY: Labs reviewed: Basic Metabolic Panel:  Recent Labs  16/10/96 0819 05/19/15 1555 05/22/15 1559 05/30/15 05/30/15 0552 06/05/15  NA 140 141 140 135* 135 136*  K 4.3 4.1 4.1  --  3.7 5.3  CL 100 103  --   --  100*  --   CO2 25 27 30*  --  27  --   GLUCOSE 102* 96 186*  --  151*  --   BUN 39* 27* 33.3* 18 18 24*  CREATININE 1.00 0.89 1.1 1.1 1.07* 0.9  CALCIUM 9.4 9.7 9.8  --  8.4*  --    Liver Function Tests:  Recent Labs  12/02/14 0819 05/19/15 1555 05/22/15 1559 06/05/15  AST ALT ALKPHOS 83 76 83 62  BILITOT 0.4 0.3 0.36  --   PROT 7.2 7.7 7.8  --   ALBUMIN 4.0 3.6 3.6  --    CBC:  Recent Labs  05/19/15 1555 05/22/15 1559 05/30/15 0552 05/31/15 0415 06/01/15 06/01/15 0338 06/05/15  WBC 4.8 4.4 7.4 8.8 6.7 6.7 5.3  NEUTROABS 2.2 2.4  --   --   --   --  3  HGB 11.7* 12.0 10.1* 9.7*  --  8.4* 7.8*  HCT 37.5 36.8 31.3* 29.5*  --  26.4* 25*  MCV 84.5 83.6 84.1 83.1  --  82.8  --   PLT 183 193 150 134*  --  136* 238   CBG:  Recent Labs  05/29/15 0950 05/29/15 1251 05/29/15 1635  GLUCAP 87 109* 115*      Dg Chest 2 View  05/19/2015   CLINICAL DATA:  Preoperative evaluation for knee arthroplasty. Hypertension. EXAM: CHEST  2 VIEW COMPARISON:  May 12, 2013 FINDINGS: There is no edema or consolidation. Heart is upper normal in size with pulmonary vascularity within normal limits. There is atherosclerotic calcification in aorta with mild aortic tortuosity, stable. There is postoperative change in the left shoulder. IMPRESSION: No edema or consolidation.  Stable cardiac silhouette. Electronically Signed   By: Bretta Bang III M.D.   On: 05/19/2015 16:30   Dg Abd Portable 1v  06/01/2015  CLINICAL DATA:  82 year old female with constipation. Initial encounter. EXAM: PORTABLE ABDOMEN - 1 VIEW COMPARISON:  CT Abdomen and Pelvis 08/25/2012. FINDINGS: 2 portable supine views of the abdomen at 1337 hours. Non obstructed bowel gas pattern. Redundant colon with moderate volume of retained stool. No definite pneumoperitoneum on these supine views. Moderate dextro convex lumbar scoliosis. Partially visible bilateral proximal femur chronic ORIF hardware. Negative visualized lung bases. Calcified aortic atherosclerosis. IMPRESSION: Non obstructed bowel gas pattern with redundant colon and moderate volume of retained stool. Electronically Signed   By: Odessa Fleming M.D.   On: 06/01/2015 13:48    ASSESSMENT/PLAN:  Left knee osteoarthritis S/P left total knee arthroplasty - for Home health PT and OT; will discontinue Oxycontin 10 mg ER and continue Oxycodone 5 mg IR 1 tab PO Q 3 hours PRN for pain; Xarelto 10 mg 1 tab PO daily for DVT prophylaxis; Robaxin 500 mg 1 tab PO Q 6 hours PRN for muscle spasm; follow-up with Dr. Ranell Patrick, orthopedics  Hypothyroidism - continue Synthroid 25 g 1 tab daily  Hypertension -  well-controlled; continue Cozaar 50 mg 1 tab by mouth daily  Anemia, acute blood loss - hemoglobin 7.8; continue ferrous sulfate 325 mg 1 tab by mouth twice a day  Leg edema - continue Lasix 40 mg 1 tab by mouth daily  Diabetes mellitus,  type II  - hemoglobin A1c 6.1; diet-controlled  OAB - continue Detrol LA 4 mg 1 capsule by mouth daily  Constipation - continue MiraLAX 17 g by mouth twice a day and senna S 2 tabs by mouth twice a day  Protein calorie malnutrition, severe - albumin 2.85; continue Procel 2 scoops by mouth twice a day     I have filled out patient's discharge paperwork and written prescriptions.  Patient will receive home health PT and OT.  Total discharge time: Less than 30 minutes  Discharge time involved coordination of the discharge process with Child psychotherapist, nursing staff and therapy department. Medical justification for home health services  verified.    [facility], NP [employer] [phone]

## 2015-06-13 DIAGNOSIS — I2510 Atherosclerotic heart disease of native coronary artery without angina pectoris: Secondary | ICD-10-CM | POA: Diagnosis not present

## 2015-06-13 DIAGNOSIS — I872 Venous insufficiency (chronic) (peripheral): Secondary | ICD-10-CM | POA: Diagnosis not present

## 2015-06-13 DIAGNOSIS — I10 Essential (primary) hypertension: Secondary | ICD-10-CM | POA: Diagnosis not present

## 2015-06-13 DIAGNOSIS — Z471 Aftercare following joint replacement surgery: Secondary | ICD-10-CM | POA: Diagnosis not present

## 2015-06-13 DIAGNOSIS — E1159 Type 2 diabetes mellitus with other circulatory complications: Secondary | ICD-10-CM | POA: Diagnosis not present

## 2015-06-13 DIAGNOSIS — Z96652 Presence of left artificial knee joint: Secondary | ICD-10-CM | POA: Diagnosis not present

## 2015-06-13 DIAGNOSIS — M25562 Pain in left knee: Secondary | ICD-10-CM | POA: Diagnosis not present

## 2015-06-13 DIAGNOSIS — M1990 Unspecified osteoarthritis, unspecified site: Secondary | ICD-10-CM | POA: Diagnosis not present

## 2015-06-14 DIAGNOSIS — I872 Venous insufficiency (chronic) (peripheral): Secondary | ICD-10-CM | POA: Diagnosis not present

## 2015-06-14 DIAGNOSIS — M1990 Unspecified osteoarthritis, unspecified site: Secondary | ICD-10-CM | POA: Diagnosis not present

## 2015-06-14 DIAGNOSIS — E1159 Type 2 diabetes mellitus with other circulatory complications: Secondary | ICD-10-CM | POA: Diagnosis not present

## 2015-06-14 DIAGNOSIS — I10 Essential (primary) hypertension: Secondary | ICD-10-CM | POA: Diagnosis not present

## 2015-06-14 DIAGNOSIS — Z471 Aftercare following joint replacement surgery: Secondary | ICD-10-CM | POA: Diagnosis not present

## 2015-06-14 DIAGNOSIS — I2510 Atherosclerotic heart disease of native coronary artery without angina pectoris: Secondary | ICD-10-CM | POA: Diagnosis not present

## 2015-06-15 DIAGNOSIS — M1990 Unspecified osteoarthritis, unspecified site: Secondary | ICD-10-CM | POA: Diagnosis not present

## 2015-06-15 DIAGNOSIS — E1159 Type 2 diabetes mellitus with other circulatory complications: Secondary | ICD-10-CM | POA: Diagnosis not present

## 2015-06-15 DIAGNOSIS — I10 Essential (primary) hypertension: Secondary | ICD-10-CM | POA: Diagnosis not present

## 2015-06-15 DIAGNOSIS — Z471 Aftercare following joint replacement surgery: Secondary | ICD-10-CM | POA: Diagnosis not present

## 2015-06-15 DIAGNOSIS — I2510 Atherosclerotic heart disease of native coronary artery without angina pectoris: Secondary | ICD-10-CM | POA: Diagnosis not present

## 2015-06-15 DIAGNOSIS — I872 Venous insufficiency (chronic) (peripheral): Secondary | ICD-10-CM | POA: Diagnosis not present

## 2015-06-16 DIAGNOSIS — Z471 Aftercare following joint replacement surgery: Secondary | ICD-10-CM | POA: Diagnosis not present

## 2015-06-16 DIAGNOSIS — M1990 Unspecified osteoarthritis, unspecified site: Secondary | ICD-10-CM | POA: Diagnosis not present

## 2015-06-16 DIAGNOSIS — I10 Essential (primary) hypertension: Secondary | ICD-10-CM | POA: Diagnosis not present

## 2015-06-16 DIAGNOSIS — I2510 Atherosclerotic heart disease of native coronary artery without angina pectoris: Secondary | ICD-10-CM | POA: Diagnosis not present

## 2015-06-16 DIAGNOSIS — E1159 Type 2 diabetes mellitus with other circulatory complications: Secondary | ICD-10-CM | POA: Diagnosis not present

## 2015-06-16 DIAGNOSIS — I872 Venous insufficiency (chronic) (peripheral): Secondary | ICD-10-CM | POA: Diagnosis not present

## 2015-06-19 DIAGNOSIS — I2510 Atherosclerotic heart disease of native coronary artery without angina pectoris: Secondary | ICD-10-CM | POA: Diagnosis not present

## 2015-06-19 DIAGNOSIS — E1159 Type 2 diabetes mellitus with other circulatory complications: Secondary | ICD-10-CM | POA: Diagnosis not present

## 2015-06-19 DIAGNOSIS — M1990 Unspecified osteoarthritis, unspecified site: Secondary | ICD-10-CM | POA: Diagnosis not present

## 2015-06-19 DIAGNOSIS — I10 Essential (primary) hypertension: Secondary | ICD-10-CM | POA: Diagnosis not present

## 2015-06-19 DIAGNOSIS — I872 Venous insufficiency (chronic) (peripheral): Secondary | ICD-10-CM | POA: Diagnosis not present

## 2015-06-19 DIAGNOSIS — Z471 Aftercare following joint replacement surgery: Secondary | ICD-10-CM | POA: Diagnosis not present

## 2015-06-20 DIAGNOSIS — M25662 Stiffness of left knee, not elsewhere classified: Secondary | ICD-10-CM | POA: Diagnosis not present

## 2015-06-20 DIAGNOSIS — Z96652 Presence of left artificial knee joint: Secondary | ICD-10-CM | POA: Diagnosis not present

## 2015-06-20 DIAGNOSIS — R262 Difficulty in walking, not elsewhere classified: Secondary | ICD-10-CM | POA: Diagnosis not present

## 2015-06-22 DIAGNOSIS — Z96652 Presence of left artificial knee joint: Secondary | ICD-10-CM | POA: Diagnosis not present

## 2015-06-22 DIAGNOSIS — M25662 Stiffness of left knee, not elsewhere classified: Secondary | ICD-10-CM | POA: Diagnosis not present

## 2015-06-22 DIAGNOSIS — R262 Difficulty in walking, not elsewhere classified: Secondary | ICD-10-CM | POA: Diagnosis not present

## 2015-06-26 DIAGNOSIS — R262 Difficulty in walking, not elsewhere classified: Secondary | ICD-10-CM | POA: Diagnosis not present

## 2015-06-26 DIAGNOSIS — Z96652 Presence of left artificial knee joint: Secondary | ICD-10-CM | POA: Diagnosis not present

## 2015-06-26 DIAGNOSIS — M25662 Stiffness of left knee, not elsewhere classified: Secondary | ICD-10-CM | POA: Diagnosis not present

## 2015-06-28 DIAGNOSIS — M25662 Stiffness of left knee, not elsewhere classified: Secondary | ICD-10-CM | POA: Diagnosis not present

## 2015-06-28 DIAGNOSIS — R262 Difficulty in walking, not elsewhere classified: Secondary | ICD-10-CM | POA: Diagnosis not present

## 2015-06-28 DIAGNOSIS — Z96652 Presence of left artificial knee joint: Secondary | ICD-10-CM | POA: Diagnosis not present

## 2015-07-04 DIAGNOSIS — R262 Difficulty in walking, not elsewhere classified: Secondary | ICD-10-CM | POA: Diagnosis not present

## 2015-07-04 DIAGNOSIS — M25662 Stiffness of left knee, not elsewhere classified: Secondary | ICD-10-CM | POA: Diagnosis not present

## 2015-07-04 DIAGNOSIS — Z96652 Presence of left artificial knee joint: Secondary | ICD-10-CM | POA: Diagnosis not present

## 2015-07-05 ENCOUNTER — Ambulatory Visit (INDEPENDENT_AMBULATORY_CARE_PROVIDER_SITE_OTHER): Payer: Medicare Other | Admitting: Podiatry

## 2015-07-05 ENCOUNTER — Encounter: Payer: Self-pay | Admitting: Podiatry

## 2015-07-05 DIAGNOSIS — M79676 Pain in unspecified toe(s): Secondary | ICD-10-CM

## 2015-07-05 DIAGNOSIS — B351 Tinea unguium: Secondary | ICD-10-CM | POA: Diagnosis not present

## 2015-07-05 NOTE — Patient Instructions (Signed)
Diabetes and Foot Care Diabetes may cause you to have problems because of poor blood supply (circulation) to your feet and legs. This may cause the skin on your feet to become thinner, break easier, and heal more slowly. Your skin may become dry, and the skin may peel and crack. You may also have nerve damage in your legs and feet causing decreased feeling in them. You may not notice minor injuries to your feet that could lead to infections or more serious problems. Taking care of your feet is one of the most important things you can do for yourself.  HOME CARE INSTRUCTIONS  Wear shoes at all times, even in the house. Do not go barefoot. Bare feet are easily injured.  Check your feet daily for blisters, cuts, and redness. If you cannot see the bottom of your feet, use a mirror or ask someone for help.  Wash your feet with warm water (do not use hot water) and mild soap. Then pat your feet and the areas between your toes until they are completely dry. Do not soak your feet as this can dry your skin.  Apply a moisturizing lotion or petroleum jelly (that does not contain alcohol and is unscented) to the skin on your feet and to dry, brittle toenails. Do not apply lotion between your toes.  Trim your toenails straight across. Do not dig under them or around the cuticle. File the edges of your nails with an emery board or nail file.  Do not cut corns or calluses or try to remove them with medicine.  Wear clean socks or stockings every day. Make sure they are not too tight. Do not wear knee-high stockings since they may decrease blood flow to your legs.  Wear shoes that fit properly and have enough cushioning. To break in new shoes, wear them for just a few hours a day. This prevents you from injuring your feet. Always look in your shoes before you put them on to be sure there are no objects inside.  Do not cross your legs. This may decrease the blood flow to your feet.  If you find a minor scrape,  cut, or break in the skin on your feet, keep it and the skin around it clean and dry. These areas may be cleansed with mild soap and water. Do not cleanse the area with peroxide, alcohol, or iodine.  When you remove an adhesive bandage, be sure not to damage the skin around it.  If you have a wound, look at it several times a day to make sure it is healing.  Do not use heating pads or hot water bottles. They may burn your skin. If you have lost feeling in your feet or legs, you may not know it is happening until it is too late.  Make sure your health care provider performs a complete foot exam at least annually or more often if you have foot problems. Report any cuts, sores, or bruises to your health care provider immediately. SEEK MEDICAL CARE IF:   You have an injury that is not healing.  You have cuts or breaks in the skin.  You have an ingrown nail.  You notice redness on your legs or feet.  You feel burning or tingling in your legs or feet.  You have pain or cramps in your legs and feet.  Your legs or feet are numb.  Your feet always feel cold. SEEK IMMEDIATE MEDICAL CARE IF:   There is increasing redness,   swelling, or pain in or around a wound.  There is a red line that goes up your leg.  Pus is coming from a wound.  You develop a fever or as directed by your health care provider.  You notice a bad smell coming from an ulcer or wound.   This information is not intended to replace advice given to you by your health care provider. Make sure you discuss any questions you have with your health care provider.   Document Released: 02/16/2000 Document Revised: 10/21/2012 Document Reviewed: 07/28/2012 Elsevier Interactive Patient Education 2016 Elsevier Inc.  

## 2015-07-06 DIAGNOSIS — Z96652 Presence of left artificial knee joint: Secondary | ICD-10-CM | POA: Diagnosis not present

## 2015-07-06 DIAGNOSIS — R262 Difficulty in walking, not elsewhere classified: Secondary | ICD-10-CM | POA: Diagnosis not present

## 2015-07-06 DIAGNOSIS — M25662 Stiffness of left knee, not elsewhere classified: Secondary | ICD-10-CM | POA: Diagnosis not present

## 2015-07-06 NOTE — Progress Notes (Signed)
Patient ID: Jessica Hawkins, female   DOB: October 14, 1922, 82 y.o.   MRN: [id number]   Subjective:  This patient presents again today complaining of thick and long toenails are strong comfortable walking wearing shoes and requests nail debridement. The last visit for this similar service was on 03/07/2014 Patient's caregiver is present in treatment room today. Patient undergoing physical therapy associated with knee surgery. The physical therapist notice muscular weakness in the left foot ankle area and mentioned this to patient's caregiver  Objective: Pitting edema bilaterally The toenails are extremely elongated, brittle, deformed, discolored and tender to palpation 6-10 No open skin lesions bilaterally Dorsi flexion, plantar flexion 5/5 right and 0/5 left  Assessment: Type II diabetic with a history of peripheral arterial disease and peripheral neuropathy Symptomatic onychomycoses 6-10 Motor weakness left lower extremity Patient has minimal ability to weight-bear and walk at this time  Plan: Debridement toenails 6-10 mechanically and electrically without any bleeding  As patient has minimal weightbearing I made the patient and caregiver way that an AFO on the left may cause a gait disturbance causing possible falling. I advised them to follow-up with orthopedic doctor in regards to left lower leg dropfoot  Return intervals recommended at 3 months

## 2015-07-11 ENCOUNTER — Ambulatory Visit (INDEPENDENT_AMBULATORY_CARE_PROVIDER_SITE_OTHER): Payer: Medicare Other | Admitting: Podiatry

## 2015-07-11 ENCOUNTER — Encounter: Payer: Self-pay | Admitting: Podiatry

## 2015-07-11 DIAGNOSIS — I7389 Other specified peripheral vascular diseases: Secondary | ICD-10-CM

## 2015-07-11 DIAGNOSIS — Z96652 Presence of left artificial knee joint: Secondary | ICD-10-CM | POA: Diagnosis not present

## 2015-07-11 DIAGNOSIS — R262 Difficulty in walking, not elsewhere classified: Secondary | ICD-10-CM | POA: Diagnosis not present

## 2015-07-11 DIAGNOSIS — L84 Corns and callosities: Secondary | ICD-10-CM

## 2015-07-11 DIAGNOSIS — M25662 Stiffness of left knee, not elsewhere classified: Secondary | ICD-10-CM | POA: Diagnosis not present

## 2015-07-11 NOTE — Patient Instructions (Signed)
Today the ends of the third toes right and left feet he had thick skin or corn-like formations. The end of the third left toe had a small corn with some dried blood. There is no sign of infection. Wear the toe prop to elevate toes 2-5 on the left foot daily

## 2015-07-11 NOTE — Progress Notes (Signed)
Patient ID: Jessica MunchPauline G Hawkins, female   DOB: November 09, 1922, 82 y.o.   MRN: [id number]   Subjective:  This patient presents again today complaining of thick and long toenails are strong comfortable walking wearing shoes and requests nail debridement. The last visit for this similar service was on 07/05/2015 Patient's daughter is present in treatment room today. Their concern today is discomfort on the distal third toes bilaterally and discomfort around the lateral fifth right toenail margin. The nails were debrided on the visit of 07/05/2015 Objective: Pitting edema bilaterally No open skin lesions bilaterally The toenails have been debrided previously, however, there is a keratoses on the lateral fifth right nail groove Distal keratoses third right toe Dried blood distal third left toe without any surrounding erythema edema or drainage Dorsi flexion, plantar flexion 5/5 right and 0/5 left Hammertoe 2-5 bilaterally Assessment: Type II diabetic with a history of peripheral arterial disease and peripheral neuropathy  Motor weakness left lower extremity Patient has minimal ability to weight-bear and walk at this time hammertoes 2-5 bilaterally with symptoms distal third toes bilaterally  Listers corn fifth right toe  Plan: Debridement of toenails toenails 6-10 mechanically and electrically without any bleeding Debrided keratoses third right toe without any bleeding Dispensed toe prop to support and elevate toes 2-4 left Patient and patient's daughter advised that the corners of the distal third toes were surgery with hammertoes   Return intervals recommended at 3 months

## 2015-07-12 ENCOUNTER — Telehealth: Payer: Self-pay

## 2015-07-12 NOTE — Telephone Encounter (Signed)
Patient's daughter called to inform [name] patient with decreased mobility and some apparent nerve issues in left foot [facility]. Patient has a family member with medical background that thinks a QMC or QME (nerve conduction test) may be necessary to further evaluate patient for nerve damage.   I scheduled patient for an appointment next Tuesday 07-18-15 at 4:00 pm. Crist FatFern would like to know if [name] has any recommendations prior to pending appointment, if so please advise.

## 2015-07-12 NOTE — Telephone Encounter (Signed)
I have no recommendations prior to the appointment that has been made.

## 2015-07-13 NOTE — Telephone Encounter (Signed)
[name],daughter notified and agreed.

## 2015-07-14 DIAGNOSIS — M25662 Stiffness of left knee, not elsewhere classified: Secondary | ICD-10-CM | POA: Diagnosis not present

## 2015-07-14 DIAGNOSIS — Z96652 Presence of left artificial knee joint: Secondary | ICD-10-CM | POA: Diagnosis not present

## 2015-07-14 DIAGNOSIS — R262 Difficulty in walking, not elsewhere classified: Secondary | ICD-10-CM | POA: Diagnosis not present

## 2015-07-18 ENCOUNTER — Ambulatory Visit (INDEPENDENT_AMBULATORY_CARE_PROVIDER_SITE_OTHER): Payer: Medicare Other | Admitting: Internal Medicine

## 2015-07-18 ENCOUNTER — Encounter: Payer: Self-pay | Admitting: Internal Medicine

## 2015-07-18 VITALS — BP 120/72 | HR 76 | Temp 98.4°F | Ht 68.0 in | Wt 139.0 lb

## 2015-07-18 DIAGNOSIS — M170 Bilateral primary osteoarthritis of knee: Secondary | ICD-10-CM

## 2015-07-18 DIAGNOSIS — I872 Venous insufficiency (chronic) (peripheral): Secondary | ICD-10-CM | POA: Diagnosis not present

## 2015-07-18 DIAGNOSIS — I10 Essential (primary) hypertension: Secondary | ICD-10-CM | POA: Diagnosis not present

## 2015-07-18 DIAGNOSIS — R269 Unspecified abnormalities of gait and mobility: Secondary | ICD-10-CM

## 2015-07-18 DIAGNOSIS — M21372 Foot drop, left foot: Secondary | ICD-10-CM | POA: Diagnosis not present

## 2015-07-18 DIAGNOSIS — M25511 Pain in right shoulder: Secondary | ICD-10-CM

## 2015-07-18 DIAGNOSIS — M25662 Stiffness of left knee, not elsewhere classified: Secondary | ICD-10-CM | POA: Diagnosis not present

## 2015-07-18 DIAGNOSIS — Z96652 Presence of left artificial knee joint: Secondary | ICD-10-CM | POA: Diagnosis not present

## 2015-07-18 DIAGNOSIS — R262 Difficulty in walking, not elsewhere classified: Secondary | ICD-10-CM | POA: Diagnosis not present

## 2015-07-18 NOTE — Progress Notes (Signed)
Patient ID: Jessica Hawkins, female   DOB: [birth year], 82 y.o.   MRN: [id number]    Facility  [facility]    Place of Service:   OFFICE    No Known Allergies  Chief Complaint  Patient presents with  . Acute Visit    side of left lower leg numb, poor ankle movement, can't bring toes up.  Here with Surveyor, mining  . Shoulder Pain    right and right knee pain, popping daily  . Sore Throat    today    HPI:  Foot drop, left - Weakness in the left lower leg and foot appears to have occurred since her surgery for replacement of the left knee. Patient has a very weak dorsiflexion of the left foot. Patient an attendant said that she is not actually dragging his foot when she attempts walking. She says that there is a tingling and numbness from at least the knee down into the foot.  Primary osteoarthritis of both knees - significant pain in the right knee. Left knee has had the knee replacement and is doing much better.  Venous insufficiency (chronic) (peripheral) - chronic edema related mainly to this problem. Ankles appear much better than they have in the past. She is wearing compression stockings.  Right shoulder pain - chronic problem that is quite bothersome to her now.  Abnormality of gait - unstable. Using walker.   Essential hypertension - controlled    Medications: Patient's Medications  New Prescriptions   No medications on file  Previous Medications   AMBULATORY NON FORMULARY MEDICATION    Knee High Compression Hose with Zippers for both legs. Diagnosis M25.561, R60.9, I87.2   ASPIRIN EC 81 MG TABLET    Take 81 mg by mouth daily.   BAYER MICROLET LANCETS LANCETS    Use as instructed to check blood sugar once daily.250.00   FERROUS SULFATE 325 (65 FE) MG TABLET    Take 325 mg by mouth 2 (two) times daily with a meal.   FUROSEMIDE (LASIX) 40 MG TABLET    Take one tablet by mouth once daily for edema   GLUCOSE BLOOD (BAYER CONTOUR TEST) TEST STRIP    Use as instructed to check  blood sugar once daily. 250.00   GLUCOSE BLOOD TEST STRIP    Reported on 06/09/2015   LEVOTHYROXINE (SYNTHROID, LEVOTHROID) 25 MCG TABLET    Take one tablet by mouth once daily for thyroid supplement   LOSARTAN (COZAAR) 50 MG TABLET    Take one tablet by mouth once daily to control blood pressure   MULTIVITAMIN-IRON-MINERALS-FOLIC ACID (CENTRUM) CHEWABLE TABLET    Chew 1 tablet by mouth daily.   RIVAROXABAN (XARELTO) 10 MG TABS TABLET    Take 1 tablet (10 mg total) by mouth daily.  Modified Medications   No medications on file  Discontinued Medications   METHOCARBAMOL (ROBAXIN) 500 MG TABLET    Take 1 tablet (500 mg total) by mouth every 6 (six) hours as needed for muscle spasms.   ONDANSETRON (ZOFRAN) 4 MG TABLET    Take 1 tablet (4 mg total) by mouth every 6 (six) hours as needed for nausea.   OXYCODONE (OXY-IR) 5 MG CAPSULE    Take 5 mg by mouth every 3 (three) hours as needed.    POLYETHYLENE GLYCOL (MIRALAX / GLYCOLAX) PACKET    Take 17 g by mouth 2 (two) times daily. Hold for loose stools   PROTEIN (PROCEL) POWD    Take 2 scoop by mouth  2 (two) times daily.   SENNOSIDES-DOCUSATE SODIUM (SENOKOT-S) 8.6-50 MG TABLET    Take 2 tablets by mouth 2 (two) times daily. Hold for loose stools   TOLTERODINE (DETROL LA) 4 MG 24 HR CAPSULE    Take one capsule by mouth once daily for bladder control    Review of Systems  Constitutional: Negative for fever, chills, activity change, appetite change, fatigue and unexpected weight change.  HENT: Negative.   Eyes: Negative.   Respiratory: Negative.  Negative for cough and shortness of breath.   Cardiovascular: Positive for leg swelling. Negative for chest pain and palpitations.  Gastrointestinal: Positive for constipation. Negative for vomiting, diarrhea and abdominal distention.       Incontinent of stool and urine sometimes  Endocrine: Negative for cold intolerance, heat intolerance, polydipsia, polyphagia and polyuria.  Genitourinary:        Incontinent. Vaginal itching and perirectal itching without discharge. No blood in the stool. No fever.  Musculoskeletal: Positive for myalgias, back pain, joint swelling, arthralgias (Right knee and right shoulder) and gait problem.       Right shoulder pain. Right knee pain.  Skin: Negative.   Neurological: Positive for weakness (Left leg) and numbness (Left foot).       Left foot drop  Hematological: Negative.   Psychiatric/Behavioral: Positive for decreased concentration.    Filed Vitals:   07/18/15 1720  BP: 120/72  Pulse: 76  Temp: 98.4 F (36.9 C)  TempSrc: Oral  Height: '5\' 8"'  (1.727 m)  Weight: 139 lb (63.05 kg)  SpO2: 96%   Body mass index is 21.14 kg/(m^2). Filed Weights   07/18/15 1720  Weight: 139 lb (63.05 kg)     Physical Exam  Constitutional: She is oriented to person, place, and time. She appears well-developed and well-nourished. No distress.  frail  HENT:  Head: Normocephalic and atraumatic.  Left Ear: External ear normal.  Nose: Nose normal.  Bilateral wax build up.   Eyes: Conjunctivae and EOM are normal. Pupils are equal, round, and reactive to light.  Neck: Normal range of motion. Neck supple. No JVD present. No tracheal deviation present. No thyromegaly present.  Cardiovascular: Normal rate, regular rhythm, normal heart sounds and intact distal pulses.  Exam reveals no gallop and no friction rub.   No murmur heard. Varicose veins.  Pulmonary/Chest: Effort normal and breath sounds normal. No respiratory distress. She has no wheezes. She has no rales.  Abdominal: Soft. Bowel sounds are normal. She exhibits no distension and no mass. There is no tenderness.  Genitourinary:  No evidence of fungal curds. There is a nonspecific vaginal irritation. There is no discharge. There is thinning of the tissues overall. She was able to tolerate bimanual as well as speculum exam. Stool was heme negative when checked.  Musculoskeletal: She exhibits edema (1+ right  and 2+ edema) and tenderness (both knees).  Poor balance. Using walker. Pain at the right shoulder and some crepitance noted with rotational movements. Right knee pain. Left knee status post TKR.  Lymphadenopathy:    She has no cervical adenopathy.  Neurological: She is alert and oriented to person, place, and time. No cranial nerve deficit. Coordination normal.  Decreased sensation left foot. Left foot drop and weakness with dorsiflexion.  Skin: No rash noted. No erythema. No pallor.  Seborrheic keratoses. Healed left TKR scar.  Psychiatric: She has a normal mood and affect. Her behavior is normal. Thought content normal.    Labs reviewed: Lab Summary Latest Ref Rng 06/05/2015 06/01/2015  06/01/2015 05/31/2015 05/30/2015 05/30/2015  Hemoglobin 12.0 - 16.0 g/dL 7.8(A) 8.4(L) (None) 9.7(L) 10.1(L) (None)  Hematocrit 36 - 46 % 25(A) 26.4(L) (None) 29.5(L) 31.3(L) (None)  White count - 5.3 6.7 6.7 8.8 7.4 (None)  Platelet count 150 - 399 K/L 238 136(L) (None) 134(L) 150 (None)  Sodium 137 - 147 mmol/L 136(A) (None) (None) (None) 135 135(A)  Potassium 3.4 - 5.3 mmol/L 5.3 (None) (None) (None) 3.7 (None)  Calcium 8.9 - 10.3 mg/dL (None) (None) (None) (None) 8.4(L) (None)  Phosphorus - (None) (None) (None) (None) (None) (None)  Creatinine 0.5 - 1.1 mg/dL 0.9 (None) (None) (None) 1.07(H) 1.1  AST 13 - 35 U/L 18 (None) (None) (None) (None) (None)  Alk Phos 25 - 125 U/L 62 (None) (None) (None) (None) (None)  Bilirubin - (None) (None) (None) (None) (None) (None)  Glucose - 115 (None) (None) (None) 151(H) 151  Cholesterol - (None) (None) (None) (None) (None) (None)  HDL cholesterol - (None) (None) (None) (None) (None) (None)  Triglycerides - (None) (None) (None) (None) (None) (None)  LDL Direct - (None) (None) (None) (None) (None) (None)  LDL Calc - (None) (None) (None) (None) (None) (None)  Total protein - (None) (None) (None) (None) (None) (None)  Albumin - (None) (None) (None) (None) (None)  (None)   Lab Results  Component Value Date   TSH 0.755 12/02/2014   TSH 1.040 05/05/2014   TSH 0.797 11/24/2013   Lab Results  Component Value Date   BUN 24* 06/05/2015   BUN 18 05/30/2015   BUN 18 05/30/2015   Lab Results  Component Value Date   HGBA1C 6.1* 05/19/2015   HGBA1C 6.1* 12/02/2014   HGBA1C 6.5* 08/26/2014    Assessment/Plan  1. Foot drop, left - Nerve conduction test; Future  2. Primary osteoarthritis of both knees Try Aspercreme to the right knee and right shoulder 4 times daily  3. Venous insufficiency (chronic) (peripheral) Continue compression stockings  4. Right shoulder pain Try Aspercreme 4 times daily  5. Abnormality of gait Continue walking and exercises  6. Essential hypertension Controlled

## 2015-07-19 ENCOUNTER — Telehealth: Payer: Self-pay

## 2015-07-19 NOTE — Telephone Encounter (Signed)
Discussed with patients caregiver.

## 2015-07-19 NOTE — Telephone Encounter (Signed)
Patient called c/o URI symptoms- drainage from nose (blood and greenish), patient would like [facility]  I called to speak with caregiver, patient denies cough or fever. Patient with ongoing throat issues. Please advise

## 2015-07-19 NOTE — Telephone Encounter (Signed)
This is a viral illness and just symptomatic treatment is all that is necessary. I do not believe antibiotics are warranted. A product such as Mucinex Fast Max may be helpful for her.

## 2015-07-20 DIAGNOSIS — Z96652 Presence of left artificial knee joint: Secondary | ICD-10-CM | POA: Diagnosis not present

## 2015-07-20 DIAGNOSIS — R262 Difficulty in walking, not elsewhere classified: Secondary | ICD-10-CM | POA: Diagnosis not present

## 2015-07-20 DIAGNOSIS — M25662 Stiffness of left knee, not elsewhere classified: Secondary | ICD-10-CM | POA: Diagnosis not present

## 2015-07-21 ENCOUNTER — Other Ambulatory Visit: Payer: Medicare Other

## 2015-07-21 DIAGNOSIS — E1151 Type 2 diabetes mellitus with diabetic peripheral angiopathy without gangrene: Secondary | ICD-10-CM

## 2015-07-21 DIAGNOSIS — I10 Essential (primary) hypertension: Secondary | ICD-10-CM | POA: Diagnosis not present

## 2015-07-21 DIAGNOSIS — E039 Hypothyroidism, unspecified: Secondary | ICD-10-CM | POA: Diagnosis not present

## 2015-07-22 LAB — COMPREHENSIVE METABOLIC PANEL
ALBUMIN: 3.8 g/dL (ref 3.2–4.6)
ALT: 8 IU/L (ref 0–32)
AST: 16 IU/L (ref 0–40)
Albumin/Globulin Ratio: 1.1 — ABNORMAL LOW (ref 1.2–2.2)
Alkaline Phosphatase: 89 IU/L (ref 39–117)
BUN / CREAT RATIO: 33 — AB (ref 12–28)
BUN: 30 mg/dL (ref 10–36)
Bilirubin Total: 0.3 mg/dL (ref 0.0–1.2)
CALCIUM: 9.2 mg/dL (ref 8.7–10.3)
CHLORIDE: 101 mmol/L (ref 96–106)
CO2: 24 mmol/L (ref 18–29)
CREATININE: 0.91 mg/dL (ref 0.57–1.00)
GFR calc non Af Amer: 56 mL/min/{1.73_m2} — ABNORMAL LOW (ref >59)
GFR, EST AFRICAN AMERICAN: 64 mL/min/{1.73_m2} (ref >59)
GLUCOSE: 109 mg/dL — AB (ref 65–99)
Globulin, Total: 3.4 g/dL (ref 1.5–4.5)
Potassium: 4.7 mmol/L (ref 3.5–5.2)
Sodium: 140 mmol/L (ref 134–144)
TOTAL PROTEIN: 7.2 g/dL (ref 6.0–8.5)

## 2015-07-22 LAB — TSH: TSH: 1.03 u[IU]/mL (ref 0.450–4.500)

## 2015-07-22 LAB — MICROALBUMIN, URINE: Microalbumin, Urine: 278.1 ug/mL

## 2015-07-22 LAB — HEMOGLOBIN A1C
ESTIMATED AVERAGE GLUCOSE: 126 mg/dL
Hgb A1c MFr Bld: 6 % — ABNORMAL HIGH (ref 4.8–5.6)

## 2015-07-25 DIAGNOSIS — Z96652 Presence of left artificial knee joint: Secondary | ICD-10-CM | POA: Diagnosis not present

## 2015-07-25 DIAGNOSIS — M25662 Stiffness of left knee, not elsewhere classified: Secondary | ICD-10-CM | POA: Diagnosis not present

## 2015-07-25 DIAGNOSIS — R262 Difficulty in walking, not elsewhere classified: Secondary | ICD-10-CM | POA: Diagnosis not present

## 2015-07-26 ENCOUNTER — Ambulatory Visit (INDEPENDENT_AMBULATORY_CARE_PROVIDER_SITE_OTHER): Payer: Medicare Other | Admitting: Internal Medicine

## 2015-07-26 ENCOUNTER — Encounter: Payer: Self-pay | Admitting: Internal Medicine

## 2015-07-26 VITALS — BP 120/58 | HR 72 | Temp 97.4°F | Ht 68.0 in | Wt 142.2 lb

## 2015-07-26 DIAGNOSIS — E039 Hypothyroidism, unspecified: Secondary | ICD-10-CM

## 2015-07-26 DIAGNOSIS — M21372 Foot drop, left foot: Secondary | ICD-10-CM

## 2015-07-26 DIAGNOSIS — I10 Essential (primary) hypertension: Secondary | ICD-10-CM | POA: Diagnosis not present

## 2015-07-26 DIAGNOSIS — E785 Hyperlipidemia, unspecified: Secondary | ICD-10-CM | POA: Diagnosis not present

## 2015-07-26 NOTE — Progress Notes (Signed)
Patient ID: Jessica Hawkins, female   DOB: 1922-07-01, 82 y.o.   MRN: [id number]    Facility  [facility]    Place of Service:   OFFICE    No Known Allergies  Chief Complaint  Patient presents with  . Follow-up    2 month follow up. Here with caretaker Levada Dy     HPI:  Patient was seen here 07/17/2015 for weakness in the left leg and the left foot drop since her surgery. We recommended use of Aspercreme for shoulder and knee pains. We discussed ordering a nerve conduction test. It is currently scheduled for 08/09/15.  Foot drop, left - the patient is quite deliberate with her walking and lifting the left leg, the overall strength of the left leg has improved over the last week.  Essential hypertension - controlled.  Hyperlipidemia - controlled  Hypothyroidism, unspecified hypothyroidism type - compensated    Medications: Patient's Medications  New Prescriptions   No medications on file  Previous Medications   AMBULATORY NON FORMULARY MEDICATION    Knee High Compression Hose with Zippers for both legs. Diagnosis M25.561, R60.9, I87.2   ASPIRIN EC 81 MG TABLET    Take 81 mg by mouth daily.   BAYER MICROLET LANCETS LANCETS    Use as instructed to check blood sugar once daily.250.00   FERROUS SULFATE 325 (65 FE) MG TABLET    Take 325 mg by mouth every other day.    FUROSEMIDE (LASIX) 40 MG TABLET    Take one tablet by mouth once daily for edema   GLUCOSE BLOOD (BAYER CONTOUR TEST) TEST STRIP    Use as instructed to check blood sugar once daily. 250.00   GLUCOSE BLOOD TEST STRIP    Reported on 06/09/2015   LEVOTHYROXINE (SYNTHROID, LEVOTHROID) 25 MCG TABLET    Take one tablet by mouth once daily for thyroid supplement   LOSARTAN (COZAAR) 50 MG TABLET    Take one tablet by mouth once daily to control blood pressure   MULTIVITAMIN-IRON-MINERALS-FOLIC ACID (CENTRUM) CHEWABLE TABLET    Chew 1 tablet by mouth daily.   RIVAROXABAN (XARELTO) 10 MG TABS TABLET    Take 1 tablet (10 mg total) by  mouth daily.  Modified Medications   No medications on file  Discontinued Medications   No medications on file    Review of Systems  Constitutional: Negative for fever, chills, activity change, appetite change, fatigue and unexpected weight change.  HENT: Negative.   Eyes: Negative.   Respiratory: Negative.  Negative for cough and shortness of breath.   Cardiovascular: Positive for leg swelling. Negative for chest pain and palpitations.  Gastrointestinal: Positive for constipation. Negative for vomiting, diarrhea and abdominal distention.       Incontinent of stool and urine sometimes  Endocrine: Negative for cold intolerance, heat intolerance, polydipsia, polyphagia and polyuria.  Genitourinary:       Incontinent. Vaginal itching and perirectal itching without discharge. No blood in the stool. No fever.  Musculoskeletal: Positive for myalgias, back pain, joint swelling, arthralgias (Right knee and right shoulder) and gait problem.       Right shoulder pain. Right knee pain.  Skin: Negative.   Neurological: Positive for weakness (Left leg) and numbness (Left foot).       Left foot drop  Hematological: Negative.   Psychiatric/Behavioral: Positive for decreased concentration.    Filed Vitals:   07/26/15 1328  BP: 120/58  Pulse: 72  Temp: 97.4 F (36.3 C)  TempSrc: Oral  Height:  '5\' 8"'  (1.727 m)  Weight: 142 lb 3.2 oz (64.501 kg)  SpO2: 97%   Body mass index is 21.63 kg/(m^2). Filed Weights   07/26/15 1328  Weight: 142 lb 3.2 oz (64.501 kg)     Physical Exam  Constitutional: She is oriented to person, place, and time. She appears well-developed and well-nourished. No distress.  frail  HENT:  Head: Normocephalic and atraumatic.  Left Ear: External ear normal.  Nose: Nose normal.  Eyes: Conjunctivae and EOM are normal. Pupils are equal, round, and reactive to light.  Neck: Normal range of motion. Neck supple. No JVD present. No tracheal deviation present. No thyromegaly  present.  Cardiovascular: Normal rate, regular rhythm, normal heart sounds and intact distal pulses.  Exam reveals no gallop and no friction rub.   No murmur heard. Varicose veins.  Pulmonary/Chest: Effort normal and breath sounds normal. No respiratory distress. She has no wheezes. She has no rales.  Abdominal: Soft. Bowel sounds are normal. She exhibits no distension and no mass. There is no tenderness.  Musculoskeletal: She exhibits edema (1+ right and 2+ edema) and tenderness (both knees).  Poor balance. Using walker. Pain at the right shoulder and some crepitance noted with rotational movements. Right knee pain. Left knee status post TKR.  Lymphadenopathy:    She has no cervical adenopathy.  Neurological: She is alert and oriented to person, place, and time. No cranial nerve deficit. Coordination normal.  Decreased sensation left foot. Left foot drop and weakness with dorsiflexion.  Skin: No rash noted. No erythema. No pallor.  Seborrheic keratoses. Healed left TKR scar.  Psychiatric: She has a normal mood and affect. Her behavior is normal. Thought content normal.    Labs reviewed: Lab Summary Latest Ref Rng 07/21/2015 06/05/2015 06/01/2015 06/01/2015 05/31/2015 05/30/2015 05/30/2015  Hemoglobin 12.0 - 16.0 g/dL (None) 7.8(A) 8.4(L) (None) 9.7(L) 10.1(L) (None)  Hematocrit 36 - 46 % (None) 25(A) 26.4(L) (None) 29.5(L) 31.3(L) (None)  White count - (None) 5.3 6.7 6.7 8.8 7.4 (None)  Platelet count 150 - 399 K/L (None) 238 136(L) (None) 134(L) 150 (None)  Sodium 134 - 144 mmol/L 140 136(A) (None) (None) (None) 135 135(A)  Potassium 3.5 - 5.2 mmol/L 4.7 5.3 (None) (None) (None) 3.7 (None)  Calcium 8.7 - 10.3 mg/dL 9.2 (None) (None) (None) (None) 8.4(L) (None)  Phosphorus - (None) (None) (None) (None) (None) (None) (None)  Creatinine 0.57 - 1.00 mg/dL 0.91 0.9 (None) (None) (None) 1.07(H) 1.1  AST 0 - 40 IU/L 16 18 (None) (None) (None) (None) (None)  Alk Phos 39 - 117 IU/L 89 62 (None)  (None) (None) (None) (None)  Bilirubin 0.0 - 1.2 mg/dL 0.3 (None) (None) (None) (None) (None) (None)  Glucose 65 - 99 mg/dL 109(H) 115 (None) (None) (None) 151(H) 151  Cholesterol - (None) (None) (None) (None) (None) (None) (None)  HDL cholesterol - (None) (None) (None) (None) (None) (None) (None)  Triglycerides - (None) (None) (None) (None) (None) (None) (None)  LDL Direct - (None) (None) (None) (None) (None) (None) (None)  LDL Calc - (None) (None) (None) (None) (None) (None) (None)  Total protein - (None) (None) (None) (None) (None) (None) (None)  Albumin 3.2 - 4.6 g/dL 3.8 (None) (None) (None) (None) (None) (None)   Lab Results  Component Value Date   TSH 1.030 07/21/2015   TSH 0.755 12/02/2014   TSH 1.040 05/05/2014   Lab Results  Component Value Date   BUN 30 07/21/2015   BUN 24* 06/05/2015   BUN 18 05/30/2015   Lab  Results  Component Value Date   HGBA1C 6.0* 07/21/2015   HGBA1C 6.1* 05/19/2015   HGBA1C 6.1* 12/02/2014    Assessment/Plan  1. Foot drop, left PNCV to be done 08/09/2015. Follow-up here in 4 weeks.  2. Essential hypertension Controlled  3. Hyperlipidemia Controlled  4. Hypothyroidism, unspecified hypothyroidism type Compensated

## 2015-08-01 DIAGNOSIS — L84 Corns and callosities: Secondary | ICD-10-CM

## 2015-08-01 DIAGNOSIS — Z96652 Presence of left artificial knee joint: Secondary | ICD-10-CM | POA: Diagnosis not present

## 2015-08-01 DIAGNOSIS — R262 Difficulty in walking, not elsewhere classified: Secondary | ICD-10-CM | POA: Diagnosis not present

## 2015-08-01 DIAGNOSIS — M25662 Stiffness of left knee, not elsewhere classified: Secondary | ICD-10-CM | POA: Diagnosis not present

## 2015-08-03 ENCOUNTER — Telehealth: Payer: Self-pay

## 2015-08-03 DIAGNOSIS — Z96652 Presence of left artificial knee joint: Secondary | ICD-10-CM | POA: Diagnosis not present

## 2015-08-03 DIAGNOSIS — R262 Difficulty in walking, not elsewhere classified: Secondary | ICD-10-CM | POA: Diagnosis not present

## 2015-08-03 DIAGNOSIS — M25662 Stiffness of left knee, not elsewhere classified: Secondary | ICD-10-CM | POA: Diagnosis not present

## 2015-08-03 NOTE — Telephone Encounter (Signed)
It is okay for her to get tea with honey and cinnamon.

## 2015-08-03 NOTE — Telephone Encounter (Signed)
Angela informed

## 2015-08-03 NOTE — Telephone Encounter (Signed)
Patient's caregiver Marylene Landngela was calling to confirm with Dr.Green that it would be ok to give patient Tea with one tablespoon of honey and one tablespoon of cinnamon twice daily. Marylene Landngela indicates this tea has plenty of benefits that are listed on the Internet which some include helping with arthritis and cholesterol.  Please advise

## 2015-08-08 ENCOUNTER — Ambulatory Visit: Payer: Medicare Other | Admitting: Podiatry

## 2015-08-08 DIAGNOSIS — M25662 Stiffness of left knee, not elsewhere classified: Secondary | ICD-10-CM | POA: Diagnosis not present

## 2015-08-08 DIAGNOSIS — Z96652 Presence of left artificial knee joint: Secondary | ICD-10-CM | POA: Diagnosis not present

## 2015-08-08 DIAGNOSIS — G8929 Other chronic pain: Secondary | ICD-10-CM | POA: Diagnosis not present

## 2015-08-08 DIAGNOSIS — M25562 Pain in left knee: Secondary | ICD-10-CM | POA: Diagnosis not present

## 2015-08-08 DIAGNOSIS — R262 Difficulty in walking, not elsewhere classified: Secondary | ICD-10-CM | POA: Diagnosis not present

## 2015-08-09 ENCOUNTER — Encounter (INDEPENDENT_AMBULATORY_CARE_PROVIDER_SITE_OTHER): Payer: Self-pay | Admitting: Neurology

## 2015-08-09 ENCOUNTER — Ambulatory Visit (INDEPENDENT_AMBULATORY_CARE_PROVIDER_SITE_OTHER): Payer: Medicare Other | Admitting: Neurology

## 2015-08-09 DIAGNOSIS — Z0289 Encounter for other administrative examinations: Secondary | ICD-10-CM

## 2015-08-09 DIAGNOSIS — M21372 Foot drop, left foot: Secondary | ICD-10-CM | POA: Diagnosis not present

## 2015-08-10 DIAGNOSIS — Z96652 Presence of left artificial knee joint: Secondary | ICD-10-CM | POA: Diagnosis not present

## 2015-08-10 DIAGNOSIS — M25562 Pain in left knee: Secondary | ICD-10-CM | POA: Diagnosis not present

## 2015-08-10 DIAGNOSIS — G8929 Other chronic pain: Secondary | ICD-10-CM | POA: Diagnosis not present

## 2015-08-10 DIAGNOSIS — R262 Difficulty in walking, not elsewhere classified: Secondary | ICD-10-CM | POA: Diagnosis not present

## 2015-08-10 DIAGNOSIS — M25662 Stiffness of left knee, not elsewhere classified: Secondary | ICD-10-CM | POA: Diagnosis not present

## 2015-08-11 NOTE — Procedures (Signed)
   NCS (NERVE CONDUCTION STUDY) WITH EMG (ELECTROMYOGRAPHY) REPORT   STUDY DATE: August 09 2015 PATIENT NAME: Jessica Hawkins DOB: 06-04-1922 MRN: [id number]    TECHNOLOGIST: Gearldine ShownLorraine Hanauer ELECTROMYOGRAPHER: Levert FeinsteinYan, Daina Cara M.D.  CLINICAL INFORMATION:  [age] years old right-handed female with history of left knee replacement on March 28th 2017, complains of left lateral leg numbness, left ankle weakness, gait difficulty.   On examination: She has mild left ankle dorsi flexion, ankle eversion weakness, there is a noticeable bruise at left fibular head. She has decreased light touch pinprick at left lateral leg, extending to the top of the left foot, involving the first web space.  FINDINGS: NERVE CONDUCTION STUDY: Bilateral peroneal sensory responses were absent. Left peroneal to EDB motor response was absent. Right peroneal to EDB motor response showed severely decreased C map amplitude. with preserved distal latency, and conduction velocity. Bilateral tibial motor responses showed severely decreased C map amplitude, with normal distal latency, conduction velocity.  Bilateral tibial H reflexes were absent.   NEEDLE ELECTROMYOGRAPHY: Selected needle examinations were performed at left lower extremity muscles, left lumbosacral paraspinal muscles.   Left tibialis anterior: Increased insertional activity, 1-2 plus spontaneous activity, mildly enlarged motor unit potential with decreased recruitment patterns.  Left peroneal longus:Increased insertional activity, 1-2 plus spontaneous activity, mildly enlarged motor unit potential with decreased recruitment patterns.  Left tibialis posterior, medial gastrocnemius: Normal insertion activity, no spontaneous activity, mixture of normal, and mildly enlarged motor unit potential with mildly decreased recruitment patterns.  Left vastus lateralis: Normal insertion activity, no spontaneous activity, normal morphology motor unit potential, with normal recruitment  patterns.  Left biceps femoris short head: Normal insertion activity, no spontaneous activity, mixture of normal, some enlarged motor unit potential, with normal recruitment patterns.  There was no spontaneous activity at left lumbar sacral paraspinal muscles, left L4-5 S1  IMPRESSION:  This is an abnormal study. 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 There is superimposed the left common peroneal neuropathy, with evidence of incomplete axonal loss, most likely from compression around left fibular head.    INTERPRETING PHYSICIAN:   Levert FeinsteinYan, Aleni Andrus M.D. Ph.D. [facility] [address], [address] [place], [place] [address] [phone]

## 2015-08-15 ENCOUNTER — Ambulatory Visit (INDEPENDENT_AMBULATORY_CARE_PROVIDER_SITE_OTHER): Payer: Medicare Other | Admitting: Internal Medicine

## 2015-08-15 ENCOUNTER — Encounter: Payer: Self-pay | Admitting: Internal Medicine

## 2015-08-15 VITALS — BP 142/60 | HR 74 | Temp 98.2°F | Ht 68.0 in | Wt 136.0 lb

## 2015-08-15 DIAGNOSIS — T148XXA Other injury of unspecified body region, initial encounter: Secondary | ICD-10-CM | POA: Insufficient documentation

## 2015-08-15 DIAGNOSIS — R269 Unspecified abnormalities of gait and mobility: Secondary | ICD-10-CM

## 2015-08-15 DIAGNOSIS — R609 Edema, unspecified: Secondary | ICD-10-CM

## 2015-08-15 DIAGNOSIS — I10 Essential (primary) hypertension: Secondary | ICD-10-CM

## 2015-08-15 DIAGNOSIS — R262 Difficulty in walking, not elsewhere classified: Secondary | ICD-10-CM | POA: Diagnosis not present

## 2015-08-15 DIAGNOSIS — M25561 Pain in right knee: Secondary | ICD-10-CM | POA: Diagnosis not present

## 2015-08-15 DIAGNOSIS — M21372 Foot drop, left foot: Secondary | ICD-10-CM | POA: Diagnosis not present

## 2015-08-15 DIAGNOSIS — Z96652 Presence of left artificial knee joint: Secondary | ICD-10-CM | POA: Diagnosis not present

## 2015-08-15 DIAGNOSIS — T148 Other injury of unspecified body region: Secondary | ICD-10-CM | POA: Diagnosis not present

## 2015-08-15 DIAGNOSIS — E1151 Type 2 diabetes mellitus with diabetic peripheral angiopathy without gangrene: Secondary | ICD-10-CM

## 2015-08-15 DIAGNOSIS — M25662 Stiffness of left knee, not elsewhere classified: Secondary | ICD-10-CM | POA: Diagnosis not present

## 2015-08-15 NOTE — Progress Notes (Signed)
Patient ID: Jessica Hawkins, female   DOB: 1923/02/15, 82 y.o.   MRN: [id number]    Facility  [place]    Place of Service:   OFFICE    No Known Allergies  Chief Complaint  Patient presents with  . Medical Management of Chronic Issues    4 week follow-up left foot drop, review NCS done 08/09/15. Here with [name] daughter, Care manager Levada Dy    HPI:   Peripheral nerve contusion - PNCV was highly suggestive of nerve contusion at the head of the left fibula  Foot drop, left - definite weakness in the left foot with attempts at dorsiflexion.  Controlled type 2 DM with peripheral circulatory disorder (HCC) - excellent control  Essential hypertension - controlled  Edema, unspecified type - much improved by wearing compression stockings  Arthralgia of right lower leg - continues with pain in the right knee  Abnormality of gait - unstable. Using walker.    Medications: Patient's Medications  New Prescriptions   No medications on file  Previous Medications   ASPIRIN EC 81 MG TABLET    Take 81 mg by mouth daily.   BAYER MICROLET LANCETS LANCETS    Use as instructed to check blood sugar once daily.250.00   FERROUS SULFATE 325 (65 FE) MG TABLET    Take 325 mg by mouth every other day.    FUROSEMIDE (LASIX) 40 MG TABLET    Take one tablet by mouth once daily for edema   GLUCOSE BLOOD (BAYER CONTOUR TEST) TEST STRIP    Use as instructed to check blood sugar once daily. 250.00   LEVOTHYROXINE (SYNTHROID, LEVOTHROID) 25 MCG TABLET    Take one tablet by mouth once daily for thyroid supplement   LOSARTAN (COZAAR) 50 MG TABLET    Take one tablet by mouth once daily to control blood pressure   MULTIVITAMIN-IRON-MINERALS-FOLIC ACID (CENTRUM) CHEWABLE TABLET    Chew 1 tablet by mouth daily.  Modified Medications   No medications on file  Discontinued Medications   No medications on file    Review of Systems  Constitutional: Negative for fever, chills, activity change, appetite change, fatigue  and unexpected weight change.  HENT: Negative.   Eyes: Negative.   Respiratory: Negative.  Negative for cough and shortness of breath.   Cardiovascular: Positive for leg swelling. Negative for chest pain and palpitations.  Gastrointestinal: Positive for constipation. Negative for vomiting, diarrhea and abdominal distention.       Incontinent of stool and urine sometimes  Endocrine: Negative for cold intolerance, heat intolerance, polydipsia, polyphagia and polyuria.  Genitourinary:       Incontinent. Vaginal itching and perirectal itching without discharge. No blood in the stool. No fever.  Musculoskeletal: Positive for myalgias, back pain, joint swelling, arthralgias (Right knee and right shoulder) and gait problem.       Right shoulder pain. Right knee pain.  Skin: Negative.   Neurological: Positive for weakness (Left leg) and numbness (Left foot).       Left foot drop.  Hematological: Negative.   Psychiatric/Behavioral: Positive for decreased concentration.    Filed Vitals:   08/15/15 1601  BP: 142/60  Pulse: 74  Temp: 98.2 F (36.8 C)  TempSrc: Oral  Height: 5' 8" (1.727 m)  Weight: 136 lb (61.689 kg)  SpO2: 99%   Body mass index is 20.68 kg/(m^2). Filed Weights   08/15/15 1601  Weight: 136 lb (61.689 kg)     Physical Exam  Constitutional: She is oriented to person,  place, and time. She appears well-developed and well-nourished. No distress.  frail  HENT:  Head: Normocephalic and atraumatic.  Left Ear: External ear normal.  Nose: Nose normal.  Eyes: Conjunctivae and EOM are normal. Pupils are equal, round, and reactive to light.  Neck: Normal range of motion. Neck supple. No JVD present. No tracheal deviation present. No thyromegaly present.  Cardiovascular: Normal rate, regular rhythm, normal heart sounds and intact distal pulses.  Exam reveals no gallop and no friction rub.   No murmur heard. Varicose veins.  Pulmonary/Chest: Effort normal and breath sounds  normal. No respiratory distress. She has no wheezes. She has no rales.  Abdominal: Soft. Bowel sounds are normal. She exhibits no distension and no mass. There is no tenderness.  Musculoskeletal: She exhibits edema (1+ right and 2+ edema) and tenderness (both knees).  Poor balance. Using walker. Pain at the right shoulder and some crepitance noted with rotational movements. Right knee pain. Left knee status post TKR. Left foot drop. Significant weakness with attempts at dorsiflexion.  Lymphadenopathy:    She has no cervical adenopathy.  Neurological: She is alert and oriented to person, place, and time. No cranial nerve deficit. Coordination normal.  Decreased sensation left foot. Left foot drop and weakness with dorsiflexion.  Skin: No rash noted. No erythema. No pallor.  Seborrheic keratoses. Healed left TKR scar.  Psychiatric: She has a normal mood and affect. Her behavior is normal. Thought content normal.    Labs reviewed: Lab Summary Latest Ref Rng 07/21/2015 06/05/2015 06/01/2015 06/01/2015 05/31/2015 05/30/2015 05/30/2015  Hemoglobin 12.0 - 16.0 g/dL (None) 7.8(A) 8.4(L) (None) 9.7(L) 10.1(L) (None)  Hematocrit 36 - 46 % (None) 25(A) 26.4(L) (None) 29.5(L) 31.3(L) (None)  White count - (None) 5.3 6.7 6.7 8.8 7.4 (None)  Platelet count 150 - 399 K/L (None) 238 136(L) (None) 134(L) 150 (None)  Sodium 134 - 144 mmol/L 140 136(A) (None) (None) (None) 135 135(A)  Potassium 3.5 - 5.2 mmol/L 4.7 5.3 (None) (None) (None) 3.7 (None)  Calcium 8.7 - 10.3 mg/dL 9.2 (None) (None) (None) (None) 8.4(L) (None)  Phosphorus - (None) (None) (None) (None) (None) (None) (None)  Creatinine 0.57 - 1.00 mg/dL 0.91 0.9 (None) (None) (None) 1.07(H) 1.1  AST 0 - 40 IU/L 16 18 (None) (None) (None) (None) (None)  Alk Phos 39 - 117 IU/L 89 62 (None) (None) (None) (None) (None)  Bilirubin 0.0 - 1.2 mg/dL 0.3 (None) (None) (None) (None) (None) (None)  Glucose 65 - 99 mg/dL 109(H) 115 (None) (None) (None) 151(H) 151    Cholesterol - (None) (None) (None) (None) (None) (None) (None)  HDL cholesterol - (None) (None) (None) (None) (None) (None) (None)  Triglycerides - (None) (None) (None) (None) (None) (None) (None)  LDL Direct - (None) (None) (None) (None) (None) (None) (None)  LDL Calc - (None) (None) (None) (None) (None) (None) (None)  Total protein - (None) (None) (None) (None) (None) (None) (None)  Albumin 3.2 - 4.6 g/dL 3.8 (None) (None) (None) (None) (None) (None)   Lab Results  Component Value Date   TSH 1.030 07/21/2015   TSH 0.755 12/02/2014   TSH 1.040 05/05/2014   Lab Results  Component Value Date   BUN 30 07/21/2015   BUN 24* 06/05/2015   BUN 18 05/30/2015   Lab Results  Component Value Date   HGBA1C 6.0* 07/21/2015   HGBA1C 6.1* 05/19/2015   HGBA1C 6.1* 12/02/2014   Dg Chest 2 View  05/19/2015  CLINICAL DATA:  Preoperative evaluation for knee arthroplasty. Hypertension. EXAM: CHEST  2 VIEW COMPARISON:  May 12, 2013 FINDINGS: There is no edema or consolidation. Heart is upper normal in size with pulmonary vascularity within normal limits. There is atherosclerotic calcification in aorta with mild aortic tortuosity, stable. There is postoperative change in the left shoulder. IMPRESSION: No edema or consolidation.  Stable cardiac silhouette. Electronically Signed   By: Lowella Grip III M.D.   On: 05/19/2015 16:30   08/11/15 PNCV: This is an abnormal study. 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 There is superimposed the left common peroneal neuropathy, with evidence of incomplete axonal loss, most likely from compression around left fibular head.    Assessment/Plan  1. Peripheral nerve contusion See PNCV report from 08/11/2015  2. Foot drop, left Ordered left ankle-foot orthosis  3. Controlled type 2 DM with peripheral circulatory disorder (HCC) Continue current therapy  4. Essential hypertension Continue current  medications  5. Edema, unspecified type Improved with compression stockings  6. Arthralgia of right lower leg Patient is to resume exercises and water therapy  7. Abnormality of gait Continue use of walker

## 2015-08-17 ENCOUNTER — Telehealth: Payer: Self-pay | Admitting: *Deleted

## 2015-08-17 DIAGNOSIS — M25662 Stiffness of left knee, not elsewhere classified: Secondary | ICD-10-CM | POA: Diagnosis not present

## 2015-08-17 DIAGNOSIS — M1990 Unspecified osteoarthritis, unspecified site: Secondary | ICD-10-CM

## 2015-08-17 DIAGNOSIS — Z96652 Presence of left artificial knee joint: Secondary | ICD-10-CM | POA: Diagnosis not present

## 2015-08-17 DIAGNOSIS — R29898 Other symptoms and signs involving the musculoskeletal system: Secondary | ICD-10-CM | POA: Diagnosis not present

## 2015-08-17 DIAGNOSIS — M21372 Foot drop, left foot: Secondary | ICD-10-CM

## 2015-08-17 DIAGNOSIS — R269 Unspecified abnormalities of gait and mobility: Secondary | ICD-10-CM

## 2015-08-17 DIAGNOSIS — R262 Difficulty in walking, not elsewhere classified: Secondary | ICD-10-CM | POA: Diagnosis not present

## 2015-08-17 DIAGNOSIS — M25569 Pain in unspecified knee: Secondary | ICD-10-CM

## 2015-08-17 NOTE — Telephone Encounter (Signed)
Refer to occupational therapy through home health.

## 2015-08-17 NOTE — Telephone Encounter (Signed)
Jessica Hawkins, daughter called and stated that patient is requesting OT due to patient's wants to transfer back to fully independent. Daughter wants to have someone with OT to come in and train her mother on how to be safe with mobility. Please Advise.

## 2015-08-18 NOTE — Telephone Encounter (Signed)
Erroronous order removed and order placed for home health

## 2015-08-18 NOTE — Addendum Note (Signed)
Addended by: Maurice SmallBEATTY, SHUENEAKA C on: 08/18/2015 03:15 PM   Modules accepted: Orders

## 2015-08-18 NOTE — Telephone Encounter (Signed)
Order placed

## 2015-08-22 DIAGNOSIS — R262 Difficulty in walking, not elsewhere classified: Secondary | ICD-10-CM | POA: Diagnosis not present

## 2015-08-22 DIAGNOSIS — R29898 Other symptoms and signs involving the musculoskeletal system: Secondary | ICD-10-CM | POA: Diagnosis not present

## 2015-08-22 DIAGNOSIS — Z96652 Presence of left artificial knee joint: Secondary | ICD-10-CM | POA: Diagnosis not present

## 2015-08-22 DIAGNOSIS — M25662 Stiffness of left knee, not elsewhere classified: Secondary | ICD-10-CM | POA: Diagnosis not present

## 2015-08-28 ENCOUNTER — Telehealth: Payer: Self-pay | Admitting: *Deleted

## 2015-08-28 NOTE — Telephone Encounter (Signed)
Betsy with [facility] called and requested verbal orders for start of Care for patient to begin Wednesday 08/30/15. Verbal order given.

## 2015-08-29 DIAGNOSIS — Z96652 Presence of left artificial knee joint: Secondary | ICD-10-CM | POA: Diagnosis not present

## 2015-08-29 DIAGNOSIS — R29898 Other symptoms and signs involving the musculoskeletal system: Secondary | ICD-10-CM | POA: Diagnosis not present

## 2015-08-29 DIAGNOSIS — R262 Difficulty in walking, not elsewhere classified: Secondary | ICD-10-CM | POA: Diagnosis not present

## 2015-08-29 DIAGNOSIS — M25662 Stiffness of left knee, not elsewhere classified: Secondary | ICD-10-CM | POA: Diagnosis not present

## 2015-08-30 ENCOUNTER — Telehealth: Payer: Self-pay | Admitting: *Deleted

## 2015-08-30 ENCOUNTER — Other Ambulatory Visit: Payer: Self-pay | Admitting: *Deleted

## 2015-08-30 DIAGNOSIS — M170 Bilateral primary osteoarthritis of knee: Secondary | ICD-10-CM | POA: Diagnosis not present

## 2015-08-30 DIAGNOSIS — Z7982 Long term (current) use of aspirin: Secondary | ICD-10-CM | POA: Diagnosis not present

## 2015-08-30 DIAGNOSIS — Z96652 Presence of left artificial knee joint: Secondary | ICD-10-CM | POA: Diagnosis not present

## 2015-08-30 DIAGNOSIS — M21372 Foot drop, left foot: Secondary | ICD-10-CM | POA: Diagnosis not present

## 2015-08-30 DIAGNOSIS — R2689 Other abnormalities of gait and mobility: Secondary | ICD-10-CM | POA: Diagnosis not present

## 2015-08-30 DIAGNOSIS — M6281 Muscle weakness (generalized): Secondary | ICD-10-CM | POA: Diagnosis not present

## 2015-08-30 DIAGNOSIS — I10 Essential (primary) hypertension: Secondary | ICD-10-CM | POA: Diagnosis not present

## 2015-08-30 DIAGNOSIS — Z9181 History of falling: Secondary | ICD-10-CM | POA: Diagnosis not present

## 2015-08-30 DIAGNOSIS — M25562 Pain in left knee: Secondary | ICD-10-CM | POA: Diagnosis not present

## 2015-08-30 DIAGNOSIS — E1151 Type 2 diabetes mellitus with diabetic peripheral angiopathy without gangrene: Secondary | ICD-10-CM | POA: Diagnosis not present

## 2015-08-30 MED ORDER — FUROSEMIDE 40 MG PO TABS: ORAL_TABLET | ORAL | Status: DC

## 2015-08-30 NOTE — Telephone Encounter (Signed)
Patient requested to be faxed to pharmacy 

## 2015-08-30 NOTE — Telephone Encounter (Signed)
Jessica Hawkins with [facility] called and wanted verbal orders to continue PT and obtain order for a Child psychotherapistocial Worker. Verbal orders given.

## 2015-08-31 DIAGNOSIS — R29898 Other symptoms and signs involving the musculoskeletal system: Secondary | ICD-10-CM | POA: Diagnosis not present

## 2015-08-31 DIAGNOSIS — M25662 Stiffness of left knee, not elsewhere classified: Secondary | ICD-10-CM | POA: Diagnosis not present

## 2015-08-31 DIAGNOSIS — M21372 Foot drop, left foot: Secondary | ICD-10-CM | POA: Diagnosis not present

## 2015-08-31 DIAGNOSIS — E1151 Type 2 diabetes mellitus with diabetic peripheral angiopathy without gangrene: Secondary | ICD-10-CM | POA: Diagnosis not present

## 2015-08-31 DIAGNOSIS — M170 Bilateral primary osteoarthritis of knee: Secondary | ICD-10-CM | POA: Diagnosis not present

## 2015-08-31 DIAGNOSIS — R262 Difficulty in walking, not elsewhere classified: Secondary | ICD-10-CM | POA: Diagnosis not present

## 2015-08-31 DIAGNOSIS — Z96652 Presence of left artificial knee joint: Secondary | ICD-10-CM | POA: Diagnosis not present

## 2015-08-31 DIAGNOSIS — M6281 Muscle weakness (generalized): Secondary | ICD-10-CM | POA: Diagnosis not present

## 2015-08-31 DIAGNOSIS — R2689 Other abnormalities of gait and mobility: Secondary | ICD-10-CM | POA: Diagnosis not present

## 2015-08-31 DIAGNOSIS — I10 Essential (primary) hypertension: Secondary | ICD-10-CM | POA: Diagnosis not present

## 2015-09-06 DIAGNOSIS — M21372 Foot drop, left foot: Secondary | ICD-10-CM | POA: Diagnosis not present

## 2015-09-06 DIAGNOSIS — M6281 Muscle weakness (generalized): Secondary | ICD-10-CM | POA: Diagnosis not present

## 2015-09-06 DIAGNOSIS — I10 Essential (primary) hypertension: Secondary | ICD-10-CM | POA: Diagnosis not present

## 2015-09-06 DIAGNOSIS — M170 Bilateral primary osteoarthritis of knee: Secondary | ICD-10-CM | POA: Diagnosis not present

## 2015-09-06 DIAGNOSIS — R2689 Other abnormalities of gait and mobility: Secondary | ICD-10-CM | POA: Diagnosis not present

## 2015-09-06 DIAGNOSIS — E1151 Type 2 diabetes mellitus with diabetic peripheral angiopathy without gangrene: Secondary | ICD-10-CM | POA: Diagnosis not present

## 2015-09-08 DIAGNOSIS — Z96652 Presence of left artificial knee joint: Secondary | ICD-10-CM | POA: Diagnosis not present

## 2015-09-08 DIAGNOSIS — M25662 Stiffness of left knee, not elsewhere classified: Secondary | ICD-10-CM | POA: Diagnosis not present

## 2015-09-08 DIAGNOSIS — R29898 Other symptoms and signs involving the musculoskeletal system: Secondary | ICD-10-CM | POA: Diagnosis not present

## 2015-09-08 DIAGNOSIS — R262 Difficulty in walking, not elsewhere classified: Secondary | ICD-10-CM | POA: Diagnosis not present

## 2015-09-13 ENCOUNTER — Ambulatory Visit (INDEPENDENT_AMBULATORY_CARE_PROVIDER_SITE_OTHER): Payer: Medicare Other | Admitting: Podiatry

## 2015-09-13 ENCOUNTER — Encounter: Payer: Self-pay | Admitting: Podiatry

## 2015-09-13 DIAGNOSIS — B351 Tinea unguium: Secondary | ICD-10-CM

## 2015-09-13 DIAGNOSIS — M79676 Pain in unspecified toe(s): Secondary | ICD-10-CM | POA: Diagnosis not present

## 2015-09-13 NOTE — Patient Instructions (Signed)
Diabetes and Foot Care Diabetes may cause you to have problems because of poor blood supply (circulation) to your feet and legs. This may cause the skin on your feet to become thinner, break easier, and heal more slowly. Your skin may become dry, and the skin may peel and crack. You may also have nerve damage in your legs and feet causing decreased feeling in them. You may not notice minor injuries to your feet that could lead to infections or more serious problems. Taking care of your feet is one of the most important things you can do for yourself.  HOME CARE INSTRUCTIONS  Wear shoes at all times, even in the house. Do not go barefoot. Bare feet are easily injured.  Check your feet daily for blisters, cuts, and redness. If you cannot see the bottom of your feet, use a mirror or ask someone for help.  Wash your feet with warm water (do not use hot water) and mild soap. Then pat your feet and the areas between your toes until they are completely dry. Do not soak your feet as this can dry your skin.  Apply a moisturizing lotion or petroleum jelly (that does not contain alcohol and is unscented) to the skin on your feet and to dry, brittle toenails. Do not apply lotion between your toes.  Trim your toenails straight across. Do not dig under them or around the cuticle. File the edges of your nails with an emery board or nail file.  Do not cut corns or calluses or try to remove them with medicine.  Wear clean socks or stockings every day. Make sure they are not too tight. Do not wear knee-high stockings since they may decrease blood flow to your legs.  Wear shoes that fit properly and have enough cushioning. To break in new shoes, wear them for just a few hours a day. This prevents you from injuring your feet. Always look in your shoes before you put them on to be sure there are no objects inside.  Do not cross your legs. This may decrease the blood flow to your feet.  If you find a minor scrape,  cut, or break in the skin on your feet, keep it and the skin around it clean and dry. These areas may be cleansed with mild soap and water. Do not cleanse the area with peroxide, alcohol, or iodine.  When you remove an adhesive bandage, be sure not to damage the skin around it.  If you have a wound, look at it several times a day to make sure it is healing.  Do not use heating pads or hot water bottles. They may burn your skin. If you have lost feeling in your feet or legs, you may not know it is happening until it is too late.  Make sure your health care provider performs a complete foot exam at least annually or more often if you have foot problems. Report any cuts, sores, or bruises to your health care provider immediately. SEEK MEDICAL CARE IF:   You have an injury that is not healing.  You have cuts or breaks in the skin.  You have an ingrown nail.  You notice redness on your legs or feet.  You feel burning or tingling in your legs or feet.  You have pain or cramps in your legs and feet.  Your legs or feet are numb.  Your feet always feel cold. SEEK IMMEDIATE MEDICAL CARE IF:   There is increasing redness,   swelling, or pain in or around a wound.  There is a red line that goes up your leg.  Pus is coming from a wound.  You develop a fever or as directed by your health care provider.  You notice a bad smell coming from an ulcer or wound.   This information is not intended to replace advice given to you by your health care provider. Make sure you discuss any questions you have with your health care provider.   Document Released: 02/16/2000 Document Revised: 10/21/2012 Document Reviewed: 07/28/2012 Elsevier Interactive Patient Education 2016 Elsevier Inc.  

## 2015-09-14 NOTE — Progress Notes (Signed)
Patient ID: Jessica Hawkins, female   DOB: 29-Nov-1922, 82 y.o.   MRN: [id number]   Subjective: This patient presents again today complaining of thick and long toenails are strong comfortable walking wearing shoes and requests nail debridement. The last visit for this similar service was on 07/11/2015  Objective: Pitting edema bilaterally The toenails are extremely elongated, brittle, deformed, discolored and tender to palpation 6-10 No open skin lesions bilaterally Dorsi flexion, plantar flexion 5/5 right and 0/5 left  Assessment: Type II diabetic with a history of peripheral arterial disease and peripheral neuropathy Symptomatic onychomycoses 6-10 Motor weakness left lower extremity Patient has minimal ability to weight-bear and walk at this time  Plan: Debridement toenails 6-10 mechanically and electrically without any bleeding Continue to wear AFO left  Return intervals recommended at 3 months

## 2015-09-21 ENCOUNTER — Encounter: Payer: Self-pay | Admitting: Internal Medicine

## 2015-09-22 ENCOUNTER — Telehealth: Payer: Self-pay | Admitting: *Deleted

## 2015-09-22 NOTE — Telephone Encounter (Signed)
Patient's caregiver calling asking what is a safe range of BP for patient? With patient being 82 years old I advised that 120/80 was not the range for her, per caregiver she want to know a range to look for when checking pt's BP, when should she be concerned? Please advise

## 2015-09-25 ENCOUNTER — Other Ambulatory Visit: Payer: Self-pay

## 2015-09-25 MED ORDER — FUROSEMIDE 40 MG PO TABS: ORAL_TABLET | ORAL | 1 refills | Status: DC

## 2015-09-26 ENCOUNTER — Telehealth: Payer: Self-pay

## 2015-09-26 DIAGNOSIS — R269 Unspecified abnormalities of gait and mobility: Secondary | ICD-10-CM

## 2015-09-26 DIAGNOSIS — M25569 Pain in unspecified knee: Secondary | ICD-10-CM

## 2015-09-26 NOTE — Telephone Encounter (Signed)
Patient's caregiver was calling on behalf of the patient to request a portable lightweight wheelchair.  If approved please call Marylene Land to come pick-up order.  Marylene Land requested that this message wait for Dr.Green to review and advise. Dr.Green if approved please complete pending order for wheelchair and forward to triage assistant.

## 2015-09-26 NOTE — Telephone Encounter (Signed)
Message was routed to Dr.Green and he is out of office until next Monday.   Jessica Hawkins called and left message on triage voicemail requesting a response to this message that was left on Friday. Please advise

## 2015-09-26 NOTE — Telephone Encounter (Signed)
What are her blood pressures running? <140/90 should be acceptable

## 2015-09-27 NOTE — Telephone Encounter (Signed)
Left message on voicemail for patient to return call when available   

## 2015-09-27 NOTE — Telephone Encounter (Signed)
Marylene Land called and stated that she will call back tomorrow to give BP readings. Stated that she was in the car and couldn't remember them.

## 2015-10-05 ENCOUNTER — Telehealth: Payer: Self-pay

## 2015-10-05 MED ORDER — SULFACETAMIDE SODIUM 10 % OP SOLN: 1.0000 [drp] | OPHTHALMIC | 0 refills | Status: DC

## 2015-10-05 NOTE — Telephone Encounter (Signed)
The pending order needs to be completed by the doctor. All diagnosis that would qualify patient for wheelchair need to be associated with order

## 2015-10-05 NOTE — Telephone Encounter (Signed)
Please approve the light weight wheel chair.

## 2015-10-05 NOTE — Telephone Encounter (Signed)
RX sent in, [place] aware

## 2015-10-05 NOTE — Telephone Encounter (Signed)
Appropriate diagnosis would be abnormal gait:  M19.90, and knee pain: M 25.569

## 2015-10-05 NOTE — Telephone Encounter (Signed)
Rx Sulfacetamide 10% ophth solution (15 cc) One drop in the affected eyery 3 hours while awake.

## 2015-10-05 NOTE — Telephone Encounter (Signed)
Jessica Hawkins called stating that the pink eye jumped on patient last night. Patient denies drainage, crusting, and fever. Patient would like recommendations, please advise

## 2015-10-06 NOTE — Telephone Encounter (Signed)
Left message for Marylene Land informing her order is at the front desk available for pick-up

## 2015-10-09 ENCOUNTER — Telehealth: Payer: Self-pay | Admitting: Internal Medicine

## 2015-10-10 NOTE — Telephone Encounter (Signed)
Received [employer] Paperwork for patient to receive health and home care benefits. Paperwork filled out and given to Dr. Chilton SiGreen to review and sign. Call [phone] or [phone] when ready for pick up.

## 2015-10-11 DIAGNOSIS — Z029 Encounter for administrative examinations, unspecified: Secondary | ICD-10-CM

## 2015-10-12 ENCOUNTER — Telehealth: Payer: Self-pay | Admitting: Internal Medicine

## 2015-10-12 NOTE — Telephone Encounter (Signed)
Received FMLA paperwork for daughter Jessica Hawkins for her mother. She needs to care for her mother. Please call Jessica BartersRuth Cavanah when completed #[phone]   [employer] FMLA paperwork placed for Dr. Chilton SiGreen to review and sign. Printed Face Sheet and last OV note and attached.

## 2015-10-16 ENCOUNTER — Telehealth: Payer: Self-pay

## 2015-10-16 NOTE — Telephone Encounter (Signed)
Message left on triage voicemail, patient's caregiver would like for Dr.Green to advise if its ok to be in the hot tub x 5 min  Please advise

## 2015-10-17 NOTE — Telephone Encounter (Signed)
It is OK for her to be in the hot tub.

## 2015-10-17 NOTE — Telephone Encounter (Signed)
Left message on caregiver, Angela's machine.

## 2015-10-20 ENCOUNTER — Telehealth: Payer: Self-pay | Admitting: *Deleted

## 2015-10-20 NOTE — Telephone Encounter (Signed)
noted 

## 2015-10-20 NOTE — Telephone Encounter (Signed)
Solmon Iceiki called with [facility] to update you on the home health referral. She stated that the caregiver of the patient, Jessica Hawkins wanted OT not PT but nurse explained to her that PT has to initiate the OT. Also patient is going to water aerobics three times daily and this disqualifies her from being homebound. Nurse told caregiver that they will have to discontinue that while receiving Home health. Caregiver stated that they will make a decision regarding the services next week. She just wanted to call to keep you informed.

## 2015-10-23 ENCOUNTER — Emergency Department (HOSPITAL_COMMUNITY)
Admission: EM | Admit: 2015-10-23 | Discharge: 2015-10-23 | Disposition: A | Discharge status: Home / Self Care | Payer: Medicare Other | Attending: Emergency Medicine | Admitting: Emergency Medicine

## 2015-10-23 ENCOUNTER — Encounter (HOSPITAL_COMMUNITY): Payer: Self-pay | Admitting: Emergency Medicine

## 2015-10-23 ENCOUNTER — Emergency Department (HOSPITAL_COMMUNITY): Payer: Medicare Other

## 2015-10-23 DIAGNOSIS — R072 Precordial pain: Secondary | ICD-10-CM | POA: Diagnosis present

## 2015-10-23 DIAGNOSIS — R1013 Epigastric pain: Secondary | ICD-10-CM | POA: Diagnosis not present

## 2015-10-23 DIAGNOSIS — Z79899 Other long term (current) drug therapy: Secondary | ICD-10-CM | POA: Diagnosis not present

## 2015-10-23 DIAGNOSIS — I10 Essential (primary) hypertension: Secondary | ICD-10-CM | POA: Insufficient documentation

## 2015-10-23 DIAGNOSIS — I2510 Atherosclerotic heart disease of native coronary artery without angina pectoris: Secondary | ICD-10-CM | POA: Insufficient documentation

## 2015-10-23 DIAGNOSIS — E1159 Type 2 diabetes mellitus with other circulatory complications: Secondary | ICD-10-CM | POA: Insufficient documentation

## 2015-10-23 DIAGNOSIS — Z96652 Presence of left artificial knee joint: Secondary | ICD-10-CM | POA: Diagnosis not present

## 2015-10-23 DIAGNOSIS — R079 Chest pain, unspecified: Secondary | ICD-10-CM | POA: Diagnosis not present

## 2015-10-23 DIAGNOSIS — Z7982 Long term (current) use of aspirin: Secondary | ICD-10-CM | POA: Diagnosis not present

## 2015-10-23 LAB — BASIC METABOLIC PANEL
Anion gap: 8 (ref 5–15)
BUN: 38 mg/dL — AB (ref 6–20)
CHLORIDE: 102 mmol/L (ref 101–111)
CO2: 26 mmol/L (ref 22–32)
CREATININE: 1.12 mg/dL — AB (ref 0.44–1.00)
Calcium: 9.2 mg/dL (ref 8.9–10.3)
GFR calc Af Amer: 48 mL/min — ABNORMAL LOW (ref >60)
GFR calc non Af Amer: 42 mL/min — ABNORMAL LOW (ref >60)
GLUCOSE: 115 mg/dL — AB (ref 65–99)
POTASSIUM: 4.2 mmol/L (ref 3.5–5.1)
SODIUM: 136 mmol/L (ref 135–145)

## 2015-10-23 LAB — CBC
HEMATOCRIT: 36.4 % (ref 36.0–46.0)
Hemoglobin: 11.2 g/dL — ABNORMAL LOW (ref 12.0–15.0)
MCH: 26.3 pg (ref 26.0–34.0)
MCHC: 30.8 g/dL (ref 30.0–36.0)
MCV: 85.4 fL (ref 78.0–100.0)
PLATELETS: 176 10*3/uL (ref 150–400)
RBC: 4.26 MIL/uL (ref 3.87–5.11)
RDW: 15.9 % — AB (ref 11.5–15.5)
WBC: 6.4 10*3/uL (ref 4.0–10.5)

## 2015-10-23 LAB — HEPATIC FUNCTION PANEL
ALK PHOS: 75 U/L (ref 38–126)
ALT: 14 U/L (ref 14–54)
AST: 26 U/L (ref 15–41)
Albumin: 3.7 g/dL (ref 3.5–5.0)
BILIRUBIN TOTAL: 0.4 mg/dL (ref 0.3–1.2)
Bilirubin, Direct: <0.1 mg/dL — ABNORMAL LOW (ref 0.1–0.5)
Total Protein: 6.7 g/dL (ref 6.5–8.1)

## 2015-10-23 LAB — I-STAT TROPONIN, ED: Troponin i, poc: 0 ng/mL (ref 0.00–0.08)

## 2015-10-23 LAB — LIPASE, BLOOD: Lipase: 25 U/L (ref 11–51)

## 2015-10-23 MED ORDER — GI COCKTAIL ~~LOC~~
30.0000 mL | Freq: Once | ORAL | Status: AC
  Administered 2015-10-23: 30 mL via ORAL
  Filled 2015-10-23: qty 30

## 2015-10-23 MED ORDER — FAMOTIDINE 20 MG PO TABS: 20.0000 mg | ORAL_TABLET | Freq: Every day | ORAL | 0 refills | Status: DC

## 2015-10-23 NOTE — ED Provider Notes (Signed)
[facility]-EMERGENCY DEPT Provider Note   CSN: [id number] Arrival date & time: 10/23/15  0230  By signing my name below, I, Doreatha Martin, attest that this documentation has been prepared under the direction and in the presence of Shon Baton, MD. Electronically Signed: Doreatha Martin, ED Scribe. 10/23/15. 3:33 AM.    History   Chief Complaint Chief Complaint  Patient presents with  . Chest Pain    HPI Jessica Hawkins is a 82 y.o. female with h/o GERD, HTN, HLD, DM who presents to the Emergency Department complaining of moderate, gradually worsening, waxing and waning substernal CP with radiation to the epigastrium onset last night at 8:30PM. She currently reports she is pain free, but states her pain was 8/10 in severity at its worst. Pt states she ate a fish sandwich and fries before the onset of her pain while preparing for bed. She states she took [employer] with temporary relief of pain. She also complains of nausea and mild SOB at this time. Per family, she did not take her blood pressure medications yesterday. No h/o of similar symptoms. NKDA. She denies emesis, increased leg swelling from baseline.   The history is provided by the patient. No language interpreter was used.    Past Medical History:  Diagnosis Date  . Acute blood loss anemia   . Anginal pain [facility])    'i haven't had them in a long time but i used to'  . Arthritis    Osteoarthritis  . BBB (bundle branch block)    RT  . Coronary atherosclerosis of unspecified type of vessel, native or graft   . Debility, unspecified   . Diabetes mellitus without complication (HCC)    Diet controlled  . Diaphragmatic hernia without mention of obstruction or gangrene   . Disturbance of skin sensation   . Edema   . Fibrocystic breast   . Gait disorder   . GERD (gastroesophageal reflux disease) 06/29/2011  . Hiatal hernia   . History of transfusion of packed red blood cells   . Hyperlipidemia   . Hypertension 06/29/11  .  Hypothyroid 06/29/2011  . Insomnia   . OAB (overactive bladder)   . Osteoporosis   . Platelet disorder (HCC)    ? Von Willebrand; abnormal PLT function assay 07/04/11   . Unspecified constipation   . Unspecified sleep apnea   . Unspecified urinary incontinence   . Unspecified vitamin D deficiency   . Unsteady gait   . Vertigo     Patient Active Problem List   Diagnosis Date Noted  . Peripheral nerve contusion 08/15/2015  . Foot drop, left 07/18/2015  . S/P total knee arthroplasty 05/29/2015  . Bleeding disorder (HCC) 05/22/2015  . Primary osteoarthritis of both knees 05/04/2015  . Nonspecific vaginitis 04/11/2015  . Recurrent UTI 03/07/2015  . Joint mouse 03/07/2015  . Right shoulder pain 01/18/2015  . Pre-operative cardiovascular examination 12/07/2014  . Loss of weight 10/13/2013  . Constipation 10/06/2013  . Venous insufficiency (chronic) (peripheral) 02/02/2013  . Knee pain 12/16/2012  . Arthritis of knee, degenerative 10/29/2012  . Edema 07/23/2012  . Abnormality of gait 07/23/2012  . Unspecified urinary incontinence 07/23/2012  . Hyperlipidemia 07/23/2012  . Controlled type 2 DM with peripheral circulatory disorder (HCC) 07/23/2012  . GERD (gastroesophageal reflux disease) 06/29/2011  . HTN (hypertension) 06/29/2011  . Intertrochanteric fracture of left femur (HCC) 06/29/2011  . Arthritis 06/29/2011  . Hypothyroid 06/29/2011    Past Surgical History:  Procedure Laterality Date  .  EYE SURGERY Right 2005   Cataract surgery  . EYE SURGERY Left 2006   Cataract surgery  . FEMUR IM NAIL  06/29/2011   Procedure: INTRAMEDULLARY (IM) NAIL FEMORAL;  Surgeon: Verlee RossettiSteven R Norris, MD;  [facility];  Service: Orthopedics;  Laterality: Left;  . HIP SURGERY Right 2002   ORIF  . ROTATOR CUFF REPAIR Left 1999   Tear  . THYROIDECTOMY  1952  . TONSILLECTOMY    . TOTAL KNEE ARTHROPLASTY Left 05/29/2015   Procedure: TOTAL KNEE ARTHROPLASTY;  Surgeon: Dannielle HuhSteve Lucey, MD;  Location:  [facility] OR;  Service: Orthopedics;  Laterality: Left;    OB History    No data available       Home Medications    Prior to Admission medications   Medication Sig Start Date End Date Taking? Authorizing Provider  aspirin EC 81 MG tablet Take 81 mg by mouth daily.   Yes Historical Provider, MD  BAYER MICROLET LANCETS lancets Use as instructed to check blood sugar once daily.250.00 03/02/14  Yes Kimber RelicArthur G Green, MD  ferrous sulfate 325 (65 FE) MG tablet Take 325 mg by mouth daily.    Yes Historical Provider, MD  furosemide (LASIX) 40 MG tablet Take one tablet by mouth once daily for edema 09/25/15  Yes Kimber RelicArthur G Green, MD  glucose blood (BAYER CONTOUR TEST) test strip Use as instructed to check blood sugar once daily. 250.00 03/02/14  Yes Kimber RelicArthur G Green, MD  levothyroxine (SYNTHROID, LEVOTHROID) 25 MCG tablet Take one tablet by mouth once daily for thyroid supplement 04/24/15  Yes Kimber RelicArthur G Green, MD  losartan (COZAAR) 50 MG tablet Take one tablet by mouth once daily to control blood pressure 04/24/15  Yes Kimber RelicArthur G Green, MD  multivitamin-iron-minerals-folic acid (CENTRUM) chewable tablet Chew 1 tablet by mouth daily.   Yes Historical Provider, MD  sulfacetamide (BLEPH-10) 10 % ophthalmic solution Place 1 drop into both eyes every 3 (three) hours. While awake Patient taking differently: Place 1 drop into both eyes 3 (three) times daily. While awake 10/05/15  Yes Kimber RelicArthur G Green, MD  famotidine (PEPCID) 20 MG tablet Take 1 tablet (20 mg total) by mouth at bedtime. 10/23/15   Shon Batonourtney F Fermina Mishkin, MD    Family History Family History  Problem Relation Age of Onset  . Cancer Mother     Stomach  . Cancer Father     Prostate  . Emphysema Father   . Alcohol abuse Sister   . Liver disease Sister   . Kidney disease Brother   . Hypertension Daughter   . Cancer Brother     Prostate, Bladder  . Emphysema Brother   . Cancer Brother     Prostate  . Arthritis Daughter     Knees  . Heart murmur Son   .  Mental illness Son     Autism    Social History Social History  Substance Use Topics  . Smoking status: Never Smoker  . Smokeless tobacco: Never Used  . Alcohol use No     Allergies   Review of patient's allergies indicates no known allergies.   Review of Systems Review of Systems  Respiratory: Negative for shortness of breath.   Cardiovascular: Positive for chest pain.  Gastrointestinal: Positive for abdominal pain and nausea. Negative for vomiting.  All other systems reviewed and are negative.   Physical Exam Updated Vital Signs BP 118/57   Pulse 62   Temp 97.7 F (36.5 C) (Oral)   Resp 14  Ht 5\' 8"  (1.727 m)   Wt 136 lb (61.7 kg)   SpO2 97%   BMI 20.68 kg/m   Physical Exam  Constitutional: She is oriented to person, place, and time. She appears well-developed and well-nourished. No distress.  Elderly, thin  HENT:  Head: Normocephalic and atraumatic.  Cardiovascular: Normal rate, regular rhythm and normal heart sounds.   No murmur heard. Pulmonary/Chest: Effort normal and breath sounds normal. No respiratory distress. She has no wheezes. She exhibits no tenderness.  Abdominal: Soft. Bowel sounds are normal. There is tenderness. There is no guarding.  Mild epigastric tenderness to palpation without rebound or guarding  Musculoskeletal: She exhibits edema.  1+ bilateral lower extremity edema  Neurological: She is alert and oriented to person, place, and time.  Skin: Skin is warm and dry.  Psychiatric: She has a normal mood and affect.  Nursing note and vitals reviewed.    ED Treatments / Results  Labs (all labs ordered are listed, but only abnormal results are displayed) Labs Reviewed  BASIC METABOLIC PANEL - Abnormal; Notable for the following:       Result Value   Glucose, Bld 115 (*)    BUN 38 (*)    Creatinine, Ser 1.12 (*)    GFR calc non Af Amer 42 (*)    GFR calc Af Amer 48 (*)    All other components within normal limits  CBC - Abnormal;  Notable for the following:    Hemoglobin 11.2 (*)    RDW 15.9 (*)    All other components within normal limits  HEPATIC FUNCTION PANEL - Abnormal; Notable for the following:    Bilirubin, Direct <0.1 (*)    All other components within normal limits  LIPASE, BLOOD  I-STAT TROPOININ, ED    EKG  EKG Interpretation  Date/Time:  Monday October 23 2015 02:42:24 EDT Ventricular Rate:  70 PR Interval:  148 QRS Duration: 138 QT Interval:  442 QTC Calculation: 477 R Axis:   -24 Text Interpretation:  Normal sinus rhythm with sinus arrhythmia Right bundle branch block Septal infarct , age undetermined Abnormal ECG No significant change since last tracing Confirmed by Gillie Crisci  MD, Tyjay Galindo ([id number]) on 10/23/2015 2:42:51 AM       Radiology Dg Chest 2 View  Result Date: 10/23/2015 CLINICAL DATA:  Epigastric and mid chest pain. Onset this evening after eating a fish sandwhich. EXAM: CHEST  2 VIEW COMPARISON:  Radiographs 05/19/2015 FINDINGS: Mild cardiomegaly with tortuosity of the thoracic aorta. No pulmonary edema. No focal airspace disease, pleural effusion or pneumothorax. The bones are under mineralized. There is scoliotic curvature in the thoracic spine. Air-filled colon under the right hemidiaphragm. IMPRESSION: Mild cardiomegaly and tortuosity of the thoracic aorta. No localizing pulmonary process. Electronically Signed   By: Rubye OaksMelanie  Ehinger M.D.   On: 10/23/2015 03:14    Procedures Procedures (including critical care time)  DIAGNOSTIC STUDIES: Oxygen Saturation is 100% on RA, normal by my interpretation.    COORDINATION OF CARE: 3:32 AM Discussed treatment plan with pt at bedside which includes CXR, lab work and pt agreed to plan.    Medications Ordered in ED Medications  gi cocktail (Maalox,Lidocaine,Donnatal) (30 mLs Oral Given 10/23/15 0401)     Initial Impression / Assessment and Plan / ED Course  I have reviewed the triage vital signs and the nursing notes.  Pertinent  labs & imaging results that were available during my care of the patient were reviewed by me and considered in my  medical decision making (see chart for details).  Clinical Course   Patient presents with chest pain. Onset of chest pain after eating fish and french fries. Currently pain-free. EKG is reassuring and chest x-ray shows mild cardiomegaly but otherwise is reassuring. History is most suggestive of GI etiology given association with food. She has mild epigastric tenderness without rebound or guarding. Patient was given a GI cocktail. Lab work including troponin, lipase and LFTs is reassuring. On recheck, patient states that the GI cocktail helped her. She continues to be nontoxic. Will start on Pepcid daily. Follow-up with primary physician if symptoms continue.  After history, exam, and medical workup I feel the patient has been appropriately medically screened and is safe for discharge home. Pertinent diagnoses were discussed with the patient. Patient was given return precautions.   Final Clinical Impressions(s) / ED Diagnoses   Final diagnoses:  Epigastric pain    New Prescriptions New Prescriptions   FAMOTIDINE (PEPCID) 20 MG TABLET    Take 1 tablet (20 mg total) by mouth at bedtime.   I personally performed the services described in this documentation, which was scribed in my presence. The recorded information has been reviewed and is accurate.    Shon Baton, MD 10/23/15 [phone]

## 2015-10-23 NOTE — ED Notes (Signed)
Pt and family understood dc material. NAD noted. Scripts and paperwork reviewed and given at dc

## 2015-10-23 NOTE — ED Triage Notes (Signed)
Pt. reports epigastric/mid chest pain after eating fish sandwich this evening , tried Pepto Bismol with no relief , mild SOB , denies nausea or diaphoresis .

## 2015-10-27 NOTE — Telephone Encounter (Signed)
Patient daughter, Windell MouldingRuth [facility]. Please Advise.

## 2015-10-31 NOTE — Telephone Encounter (Signed)
Completed today 

## 2015-11-01 NOTE — Telephone Encounter (Signed)
Jessica MouldingRuth, daughter notified to pick up. Copy made and sent for scanning.

## 2015-11-07 ENCOUNTER — Telehealth: Payer: Self-pay | Admitting: *Deleted

## 2015-11-07 NOTE — Telephone Encounter (Signed)
Marylene LandAngela, Caregiver called and [facility] Stated that the referral needed to be faxed to [phone] with diagnosis.  Please Advise.

## 2015-11-07 NOTE — Telephone Encounter (Signed)
Jessica Hawkins, daughter [facility](PO[facility]) called and wants to speak with you regarding some concerns her mother is having. Wanted to speak with you directly.   Cell: [phone] Home: [phone]

## 2015-11-08 ENCOUNTER — Other Ambulatory Visit: Payer: Self-pay | Admitting: Internal Medicine

## 2015-11-08 DIAGNOSIS — Z789 Other specified health status: Secondary | ICD-10-CM | POA: Insufficient documentation

## 2015-11-08 DIAGNOSIS — R4689 Other symptoms and signs involving appearance and behavior: Secondary | ICD-10-CM | POA: Insufficient documentation

## 2015-11-08 NOTE — Telephone Encounter (Signed)
I attempted to call back. The cell phone number was answered by "this is Barrie FolkMarty Baum. I did not leave a msg. Home phone was tried and I left a message to cal [facility], [phone].

## 2015-11-08 NOTE — Telephone Encounter (Signed)
Daughter, Flora Lippsamela Banks called back and left message on voicemail stating that she will be at [phone] the rest of the evening if you would try her again.

## 2015-11-08 NOTE — Telephone Encounter (Signed)
I called Angela. She says she actually needs an occupational [facility] to help maximize patient's ability to care for herself at home since she seems to be declining. Order was entered.

## 2015-11-08 NOTE — Telephone Encounter (Signed)
I discussed the condition of Jessica Hawkins with her daughter Flora Lippsamela Banks. They would like like occasional therapy and outpatient arena to assist with maximizing her ability to take care of herself at home.  Mrs. Salomon FickBanks says that she has a fear of walking and is declining and some of her other self-care skills.  I entered a referral request for OT.  Mrs. Salomon FickBanks says she will be bringing papers by to allow her to act as the responsible party for Mrs. Yetta BarreJones. Her mobile number is [phone]. Her home number is [phone].

## 2015-11-09 DIAGNOSIS — Z96652 Presence of left artificial knee joint: Secondary | ICD-10-CM | POA: Diagnosis not present

## 2015-11-09 DIAGNOSIS — M1711 Unilateral primary osteoarthritis, right knee: Secondary | ICD-10-CM | POA: Diagnosis not present

## 2015-11-14 ENCOUNTER — Ambulatory Visit (INDEPENDENT_AMBULATORY_CARE_PROVIDER_SITE_OTHER): Payer: Medicare Other | Admitting: Internal Medicine

## 2015-11-14 ENCOUNTER — Encounter: Payer: Self-pay | Admitting: Internal Medicine

## 2015-11-14 VITALS — BP 146/62 | HR 72 | Temp 97.9°F | Ht <= 58 in | Wt 139.0 lb

## 2015-11-14 DIAGNOSIS — Z23 Encounter for immunization: Secondary | ICD-10-CM

## 2015-11-14 DIAGNOSIS — M170 Bilateral primary osteoarthritis of knee: Secondary | ICD-10-CM

## 2015-11-14 DIAGNOSIS — I10 Essential (primary) hypertension: Secondary | ICD-10-CM

## 2015-11-14 DIAGNOSIS — M21372 Foot drop, left foot: Secondary | ICD-10-CM | POA: Diagnosis not present

## 2015-11-14 DIAGNOSIS — E1151 Type 2 diabetes mellitus with diabetic peripheral angiopathy without gangrene: Secondary | ICD-10-CM | POA: Diagnosis not present

## 2015-11-14 DIAGNOSIS — K219 Gastro-esophageal reflux disease without esophagitis: Secondary | ICD-10-CM

## 2015-11-14 DIAGNOSIS — R269 Unspecified abnormalities of gait and mobility: Secondary | ICD-10-CM | POA: Diagnosis not present

## 2015-11-14 MED ORDER — GLUCOSE BLOOD VI STRP: ORAL_STRIP | 1 refills | Status: DC

## 2015-11-14 NOTE — Progress Notes (Signed)
Facility  [facility]    Place of Service:   OFFICE    No Known Allergies  Chief Complaint  Patient presents with  . Medical Management of Chronic Issues    3 month medciation management blood pressure, edema, blood sugar. Here with daughter Olin Hauser, and [facility].  . Follow-up    10/23/15 ED for chest pain, was given Pepcid, should she continue.   . Knee Pain    right knee given out, should she have knee replacement?    HPI:  Primary osteoarthritis of both knees - patient has had successful surgery on the left knee. She is now feeling like the right knee is giving way. She feels that she will fall. She has seen her orthopedic surgeon and is planning surgery on the right knee.  Controlled type 2 DM with peripheral circulatory disorder (HCC) - well-controlled diabetes mellitus  Essential hypertension - controlled  Abnormality of gait - using walker regularly.   Foot drop, left - although she still has some numbness in the left foot following her left knee surgery, she says the weakness in the foot and the foot drop have improved substantially.  Gastroesophageal reflux disease, esophagitis presence not specified - patient was seen at the emergency room 10/23/2015 for a central slit chest discomfort. As it turns out, she had eaten a fish sandwich. Cardiac evaluation was unremarkable and patient was returned home. She had advice to take Pepcid and has been asymptomatic since then.  Encounter for immunization - Plan: Flu Vaccine QUAD 36+ mos IM    Medications: Patient's Medications  New Prescriptions   No medications on file  Previous Medications   ASPIRIN EC 81 MG TABLET    Take 81 mg by mouth daily.   BAYER MICROLET LANCETS LANCETS    Use as instructed to check blood sugar once daily.250.00   FAMOTIDINE (PEPCID) 20 MG TABLET    Take 1 tablet (20 mg total) by mouth at bedtime.   FERROUS SULFATE 325 (65 FE) MG TABLET    Take 325 mg by mouth daily.    FUROSEMIDE (LASIX) 40  MG TABLET    Take one tablet by mouth once daily for edema   GLUCOSE BLOOD (BAYER CONTOUR TEST) TEST STRIP    Use as instructed to check blood sugar once daily. 250.00   LEVOTHYROXINE (SYNTHROID, LEVOTHROID) 25 MCG TABLET    Take one tablet by mouth once daily for thyroid supplement   LOSARTAN (COZAAR) 50 MG TABLET    Take one tablet by mouth once daily to control blood pressure   MULTIVITAMIN-IRON-MINERALS-FOLIC ACID (CENTRUM) CHEWABLE TABLET    Chew 1 tablet by mouth daily.  Modified Medications   No medications on file  Discontinued Medications   SULFACETAMIDE (BLEPH-10) 10 % OPHTHALMIC SOLUTION    Place 1 drop into both eyes every 3 (three) hours. While awake    Review of Systems  Constitutional: Negative for activity change, appetite change, chills, fatigue, fever and unexpected weight change.  HENT: Negative.   Eyes: Negative.   Respiratory: Negative.  Negative for cough and shortness of breath.   Cardiovascular: Positive for leg swelling. Negative for chest pain and palpitations.  Gastrointestinal: Positive for constipation. Negative for abdominal distention, diarrhea and vomiting.       Incontinent of stool and urine sometimes  Endocrine: Negative for cold intolerance, heat intolerance, polydipsia, polyphagia and polyuria.  Genitourinary:       Incontinent. Vaginal itching and perirectal itching without discharge. No blood  in the stool. No fever.  Musculoskeletal: Positive for arthralgias (Right knee and right shoulder), back pain, gait problem, joint swelling and myalgias.       Right shoulder pain. Right knee pain and weakness.  Skin: Negative.   Neurological: Positive for weakness (Left leg) and numbness (Left foot).       Left foot drop.  Hematological: Negative.   Psychiatric/Behavioral: Positive for decreased concentration.    Vitals:   11/14/15 1500  BP: (!) 146/62  Pulse: 72  Temp: 97.9 F (36.6 C)  TempSrc: Oral  SpO2: 99%  Weight: 139 lb (63 kg)  Height: 4'  10" (1.473 m)   Body mass index is 29.05 kg/m. Wt Readings from Last 3 Encounters:  11/14/15 139 lb (63 kg)  10/23/15 136 lb (61.7 kg)  08/15/15 136 lb (61.7 kg)      Physical Exam  Constitutional: She is oriented to person, place, and time. She appears well-developed and well-nourished. No distress.  frail  HENT:  Head: Normocephalic and atraumatic.  Left Ear: External ear normal.  Nose: Nose normal.  Eyes: Conjunctivae and EOM are normal. Pupils are equal, round, and reactive to light.  Neck: Normal range of motion. Neck supple. No JVD present. No tracheal deviation present. No thyromegaly present.  Cardiovascular: Normal rate, regular rhythm, normal heart sounds and intact distal pulses.  Exam reveals no gallop and no friction rub.   No murmur heard. Varicose veins.  Pulmonary/Chest: Effort normal and breath sounds normal. No respiratory distress. She has no wheezes. She has no rales.  Abdominal: Soft. Bowel sounds are normal. She exhibits no distension and no mass. There is no tenderness.  Musculoskeletal: She exhibits edema (1+ right and 2+ edema) and tenderness (both knees).  Poor balance. Using walker. Pain at the right shoulder and some crepitance noted with rotational movements. Right knee pain. Left knee status post TKR. Left foot drop. Significant weakness with attempts at dorsiflexion.  Lymphadenopathy:    She has no cervical adenopathy.  Neurological: She is alert and oriented to person, place, and time. No cranial nerve deficit. Coordination normal.  Decreased sensation left foot. Left foot drop and weakness with dorsiflexion.  Skin: No rash noted. No erythema. No pallor.  Seborrheic keratoses. Healed left TKR scar.  Psychiatric: She has a normal mood and affect. Her behavior is normal. Thought content normal.    Labs reviewed: Lab Summary Latest Ref Rng & Units 10/23/2015 07/21/2015 06/05/2015  Hemoglobin 12.0 - 15.0 g/dL 11.2(L) (None) 7.8(A)  Hematocrit 36.0 -  46.0 % 36.4 (None) 25(A)  White count 4.0 - 10.5 K/uL 6.4 (None) 5.3  Platelet count 150 - 400 K/uL 176 (None) 238  Sodium 135 - 145 mmol/L 136 140 136(A)  Potassium 3.5 - 5.1 mmol/L 4.2 4.7 5.3  Calcium 8.9 - 10.3 mg/dL 9.2 9.2 (None)  Phosphorus - (None) (None) (None)  Creatinine 0.44 - 1.00 mg/dL 1.12(H) 0.91 0.9  AST 15 - 41 U/L '26 16 18  ' Alk Phos 38 - 126 U/L 75 89 62  Bilirubin 0.3 - 1.2 mg/dL 0.4 0.3 (None)  Glucose 65 - 99 mg/dL 115(H) 109(H) 115  Cholesterol - (None) (None) (None)  HDL cholesterol - (None) (None) (None)  Triglycerides - (None) (None) (None)  LDL Direct - (None) (None) (None)  LDL Calc - (None) (None) (None)  Total protein 6.5 - 8.1 g/dL 6.7 (None) (None)  Albumin 3.5 - 5.0 g/dL 3.7 3.8 (None)  Some recent data might be hidden   Lab  Results  Component Value Date   TSH 1.030 07/21/2015   TSH 0.755 12/02/2014   TSH 1.040 05/05/2014   Lab Results  Component Value Date   BUN 38 (H) 10/23/2015   BUN 30 07/21/2015   BUN 24 (A) 06/05/2015   Lab Results  Component Value Date   HGBA1C 6.0 (H) 07/21/2015   HGBA1C 6.1 (H) 05/19/2015   HGBA1C 6.1 (H) 12/02/2014    Assessment/Plan  1. Primary osteoarthritis of both knees Planning surgery on the right knee  2. Controlled type 2 DM with peripheral circulatory disorder (HCC) controlled - glucose blood (BAYER CONTOUR TEST) test strip; Use as instructed to check blood sugar once daily. 250.00  Dispense: 100 each; Refill: 1  3. Essential hypertension controlled  4. Abnormality of gait Using walker  5. Foot drop, left improved  6. Gastroesophageal reflux disease, esophagitis presence not specified asymptomatic  7. Encounter for immunization - Flu Vaccine QUAD 36+ mos IM    I believe patient is medically stable and cardiac stable. I approve her for anticipated surgery on the right knee. She will probably need a rehabilitation stay at a nursing facility following surgery.

## 2015-11-20 ENCOUNTER — Telehealth: Payer: Self-pay | Admitting: *Deleted

## 2015-11-20 NOTE — Telephone Encounter (Signed)
Patient caregiver, [name] called and stated that they need Dr. Amanda Cock[facility]neGreens last OV note faxed to Dr. Rogue JuryLucy's office for patient's upcoming Knee replacement. Faxed to his office [phone]

## 2015-11-24 DIAGNOSIS — Z029 Encounter for administrative examinations, unspecified: Secondary | ICD-10-CM

## 2015-11-29 ENCOUNTER — Ambulatory Visit (INDEPENDENT_AMBULATORY_CARE_PROVIDER_SITE_OTHER): Payer: Medicare Other | Admitting: Podiatry

## 2015-11-29 ENCOUNTER — Encounter: Payer: Self-pay | Admitting: Podiatry

## 2015-11-29 VITALS — BP 138/67 | HR 63 | Resp 18

## 2015-11-29 DIAGNOSIS — B351 Tinea unguium: Secondary | ICD-10-CM

## 2015-11-29 DIAGNOSIS — M79676 Pain in unspecified toe(s): Secondary | ICD-10-CM

## 2015-11-29 NOTE — Patient Instructions (Signed)
Diabetes and Foot Care Diabetes may cause you to have problems because of poor blood supply (circulation) to your feet and legs. This may cause the skin on your feet to become thinner, break easier, and heal more slowly. Your skin may become dry, and the skin may peel and crack. You may also have nerve damage in your legs and feet causing decreased feeling in them. You may not notice minor injuries to your feet that could lead to infections or more serious problems. Taking care of your feet is one of the most important things you can do for yourself.  HOME CARE INSTRUCTIONS  Wear shoes at all times, even in the house. Do not go barefoot. Bare feet are easily injured.  Check your feet daily for blisters, cuts, and redness. If you cannot see the bottom of your feet, use a mirror or ask someone for help.  Wash your feet with warm water (do not use hot water) and mild soap. Then pat your feet and the areas between your toes until they are completely dry. Do not soak your feet as this can dry your skin.  Apply a moisturizing lotion or petroleum jelly (that does not contain alcohol and is unscented) to the skin on your feet and to dry, brittle toenails. Do not apply lotion between your toes.  Trim your toenails straight across. Do not dig under them or around the cuticle. File the edges of your nails with an emery board or nail file.  Do not cut corns or calluses or try to remove them with medicine.  Wear clean socks or stockings every day. Make sure they are not too tight. Do not wear knee-high stockings since they may decrease blood flow to your legs.  Wear shoes that fit properly and have enough cushioning. To break in new shoes, wear them for just a few hours a day. This prevents you from injuring your feet. Always look in your shoes before you put them on to be sure there are no objects inside.  Do not cross your legs. This may decrease the blood flow to your feet.  If you find a minor scrape,  cut, or break in the skin on your feet, keep it and the skin around it clean and dry. These areas may be cleansed with mild soap and water. Do not cleanse the area with peroxide, alcohol, or iodine.  When you remove an adhesive bandage, be sure not to damage the skin around it.  If you have a wound, look at it several times a day to make sure it is healing.  Do not use heating pads or hot water bottles. They may burn your skin. If you have lost feeling in your feet or legs, you may not know it is happening until it is too late.  Make sure your health care provider performs a complete foot exam at least annually or more often if you have foot problems. Report any cuts, sores, or bruises to your health care provider immediately. SEEK MEDICAL CARE IF:   You have an injury that is not healing.  You have cuts or breaks in the skin.  You have an ingrown nail.  You notice redness on your legs or feet.  You feel burning or tingling in your legs or feet.  You have pain or cramps in your legs and feet.  Your legs or feet are numb.  Your feet always feel cold. SEEK IMMEDIATE MEDICAL CARE IF:   There is increasing redness,   swelling, or pain in or around a wound.  There is a red line that goes up your leg.  Pus is coming from a wound.  You develop a fever or as directed by your health care provider.  You notice a bad smell coming from an ulcer or wound.   This information is not intended to replace advice given to you by your health care provider. Make sure you discuss any questions you have with your health care provider.   Document Released: 02/16/2000 Document Revised: 10/21/2012 Document Reviewed: 07/28/2012 Elsevier Interactive Patient Education 2016 Elsevier Inc.  

## 2015-11-30 ENCOUNTER — Encounter: Payer: Self-pay | Admitting: *Deleted

## 2015-11-30 ENCOUNTER — Ambulatory Visit: Payer: Medicare Other | Attending: Internal Medicine | Admitting: *Deleted

## 2015-11-30 DIAGNOSIS — R278 Other lack of coordination: Secondary | ICD-10-CM

## 2015-11-30 DIAGNOSIS — M6281 Muscle weakness (generalized): Secondary | ICD-10-CM | POA: Insufficient documentation

## 2015-11-30 DIAGNOSIS — R2689 Other abnormalities of gait and mobility: Secondary | ICD-10-CM | POA: Diagnosis not present

## 2015-11-30 NOTE — Patient Instructions (Signed)
Pt, family and caregiver were educated in Role of OT and findings from assessment that indicate that pt may benefit from out-pt OT to address memory compensation strategies; ADL/selfcare retraining/simple meal prep and UB strengthening as tolerated. Pt should also benefit from PT assessment secondary to c/o R knee "Buckling" during ambulation, h/o balance issues and fear of falling. Pt/family/caregiver expressed agreement with this in clinic today.

## 2015-11-30 NOTE — Progress Notes (Signed)
Patient ID: Jessica MunchPauline G Hawkins, female   DOB: 04/24/1922, 82 y.o.   MRN: [id number]       Subjective: This patient presents again today complaining of thick and long toenails are strong comfortable walking wearing shoes and requests nail debridement. The last visit for this similar service was on 07/11/2015 Patient states she's not wearing AFO today  Objective: Pitting edema bilaterally The toenails are extremely elongated, brittle, deformed, discolored and tender to palpation 6-10 No open skin lesions bilaterally Dorsi flexion, plantar flexion 5/5 right and 5/5 left Corn third right toe  Assessment: Type II diabetic with a history of peripheral arterial disease and peripheral neuropathy Symptomatic onychomycoses 6-10 Motor weakness left lower extremity has improved    Plan: Debridement toenails 6-10 mechanically and electrically without any bleeding Debride corn third right toe without any bleeding Made patient aware of this improvement and her she could wear her AFO if she felt was helpful when walking  Return intervals recommended at 3 months

## 2015-11-30 NOTE — Therapy (Signed)
[facility] [address] [place], [place], [address] Phone: [phone]   Fax:  [phone]  Occupational Therapy Evaluation  Patient Details  Name: Jessica Hawkins MRN: [id number] Date of Birth: Mar 18, 1922 No Data Recorded  Encounter Date: 11/30/2015      OT End of Session - 11/30/15 1225    Visit Number 1   Number of Visits 12   Date for OT Re-Evaluation 01/25/16   Authorization Type Medicare Part A & Medicare Part B   Authorization Time Period G code every 10 th visit   Authorization - Visit Number 1   Authorization - Number of Visits 10   OT Start Time 1014   OT Stop Time 1107   OT Time Calculation (min) 53 min   Activity Tolerance Patient tolerated treatment well   Behavior During Therapy [facility] for tasks assessed/performed      Past Medical History:  Diagnosis Date  . Acute blood loss anemia   . Anginal pain [facility])    'i haven't had them in a long time but i used to'  . Arthritis    Osteoarthritis  . BBB (bundle branch block)    RT  . Coronary atherosclerosis of unspecified type of vessel, native or graft   . Debility, unspecified   . Diabetes mellitus without complication (HCC)    Diet controlled  . Diaphragmatic hernia without mention of obstruction or gangrene   . Disturbance of skin sensation   . Edema   . Fibrocystic breast   . Gait disorder   . GERD (gastroesophageal reflux disease) 06/29/2011  . Hiatal hernia   . History of transfusion of packed red blood cells   . Hyperlipidemia   . Hypertension 06/29/11  . Hypothyroid 06/29/2011  . Insomnia   . OAB (overactive bladder)   . Osteoporosis   . Platelet disorder (HCC)    ? Von Willebrand; abnormal PLT function assay 07/04/11   . Unspecified constipation   . Unspecified sleep apnea   . Unspecified urinary incontinence   . Unspecified vitamin D deficiency   . Unsteady gait   . Vertigo     Past Surgical History:  Procedure Laterality Date   . EYE SURGERY Right 2005   Cataract surgery  . EYE SURGERY Left 2006   Cataract surgery  . FEMUR IM NAIL  06/29/2011   Procedure: INTRAMEDULLARY (IM) NAIL FEMORAL;  Surgeon: Verlee Rossetti, MD;  Location: [facility] OR;  Service: Orthopedics;  Laterality: Left;  . HIP SURGERY Right 2002   ORIF  . ROTATOR CUFF REPAIR Left 1999   Tear  . THYROIDECTOMY  1952  . TONSILLECTOMY    . TOTAL KNEE ARTHROPLASTY Left 05/29/2015   Procedure: TOTAL KNEE ARTHROPLASTY;  Surgeon: Dannielle Huh, MD;  Location: [facility] OR;  Service: Orthopedics;  Laterality: Left;    There were no vitals filed for this visit.      Subjective Assessment - 11/30/15 1024    Subjective  Pt reports that she does her own bathing, dressing. She has c/o knee buckling at times and decreased balance.   Patient is accompained by: Family member  Daughter - Windell Moulding; Care taker - Angela   Patient Stated Goals Improve balance    Currently in Pain? No/denies           [facility] OT Assessment - 11/30/15 0001      Assessment   Diagnosis Generalized weakness     Precautions   Precautions Fall   Precaution Comments  Pt repotrs h/o R knee "Buckling" at times & states "I can't walk anywhere by myself, I have to have someone with me"  Pt reports fear of falling     Restrictions   Weight Bearing Restrictions No     Balance Screen   Has the patient fallen in the past 6 months No   Has the patient had a decrease in activity level because of a fear of falling?  Yes  Fear of falls   Is the patient reluctant to leave their home because of a fear of falling?  Yes     Home  Environment   Family/patient expects to be discharged to: Private residence   Living Arrangements Alone  Has caregiver assistance;  5x/week. Coverage 24/7 w/ family   Type of Home House   Home Access Stairs   Home Layout One level   Bathroom Shower/Tub Tub/Shower unit;Curtain  Grab bars near shower and sink   Shower/tub characteristics Curtain  Has a chair to sit on    Bathroom Toilet Handicapped height   How accessible Accessible via walker   Adaptive equipment Reacher;Sock aid;Long-handled shoe horn   Lives With Alone;Other (Comment)  Has care giver and family assist. Wants to return to alone     Prior Function   Level of Independence Independent with basic ADLs;Needs assistance with homemaking;Needs assistance with gait   Meal Prep Other (comment)  Pt requires assist for safety per caregiver/daughter   Laundry Maximal   Vacuuming Maximal   Light Housekeeping Maximal   Shopping Maximal   Homemaking Assistance Comments Pt gets assistance from family and caregivers for ADL's listed above. Pt reports that she can cook, however, family/caregiver report that pt is not safe. Leans over stove etc and is fall risk per thier reports.     ADL   Eating/Feeding Modified independent   Grooming Supervision/safety   Upper Body Bathing Supervision/safety   Lower Body Bathing Supervision/safety   Upper Body Dressing Supervision/safety   Lower Body Dressing Supervision/safety   Toilet Tranfer Modified independent   Toileting - Clothing Manipulation Modified independent   Tub/Shower Transfer Supervision/safety   ADL comments Pt goal is to return to independence and living alone. Pt family reports that she has difficulty with memory and safety at times     IADL   Prior Level of Function Light Housekeeping Family assists   Meal Prep --  Family assists 2* to safety. Pt leans over stove, fall risk   Prior Level of Function Community Mobility Assistance secondary to age related changes in STM and safety with meal prep and risk for falls, c/o knee "buckling".      Mobility   Mobility Status Needs assist   Mobility Status Comments Fear of falls; R knee buckles; has 4 wheeled RW and cane.     Written Expression   Dominant Hand Right     Cognition   Overall Cognitive Status History of cognitive impairments - at baseline   Memory Impaired   Memory Impairment  Decreased recall of new information;Decreased short term memory;Other (comment)  Cont to assess in functional context   Awareness Appears intact   Problem Solving Impaired   Problem Solving Impairment Other (comment)  Decreased safety awareness & awareness of deficits     Observation/Other Assessments   Outcome Measures See 9 hole peg test     Sensation   Light Touch Appears Intact     Coordination   Gross Motor Movements are Fluid and Coordinated No  Pt  with h/o arthritis and bone on bone R shoulder   Fine Motor Movements are Fluid and Coordinated No   9 Hole Peg Test Right;Left   Right 9 Hole Peg Test 31.84 sec   Left 9 Hole Peg Test 1 min 43 seconds   Other Grip strength R = 55# L= 54# JAMAR position #2     ROM / Strength   AROM / PROM / Strength AROM;Strength     AROM   Overall AROM  Deficits   Overall AROM Comments Pt with RUE AROM WFL, ~75%, pt has h/o arthritis & shoulder "bone on bone" per family members; LUE AROM WFL's for ADL's noted     Strength   Overall Strength Within functional limits for tasks performed   Overall Strength Comments Strength bilateral shoulders = 3/5; biceps/triceps/grip 3+/5 bilaterally     Hand Function   Right Hand Grip (lbs) 55#   Right Hand 3 Point Pinch 8 lbs   Left Hand Grip (lbs) 54#   Left Hand Lateral Pinch 6 lbs                         OT Education - 11/30/15 1224    Education provided Yes   Education Details Pt, family and caregiver were educated in Role of OT and findings from assessment that indicate that pt may benefit from out-pt OT to address memory compensation strategies; ADL/selfcare retraining/simple meal prep and UB strengthening as tolerated. Pt should also benefit from PT assessment secondary to c/o R knee "Buckling" during ambulation, h/o balance issues and fear of falling. Pt/family/caregiver expressed agreement with this in clinic today.   Person(s) Educated Patient;Caregiver(s);Child(ren)    Methods Explanation;Demonstration;Verbal cues   Comprehension Verbalized understanding;Need further instruction          OT Short Term Goals - 11/30/15 1301      OT SHORT TERM GOAL #1   Title Pt Min assist for memory strategies   Baseline Consistent/Mod vc's and repetetion for new information;    Time 3   Period Weeks   Status New     OT SHORT TERM GOAL #2   Title Pt will be Min A safety atrategies in the kitchen for simple meal prep   Baseline Currently having meals prepared by caregiver/family   Time 3   Period Weeks   Status New     OT SHORT TERM GOAL #3   Title Pt will be supervision for LB dressing and ADL's w/ a/e PRN   Baseline Supervisoin LB ADL's except max assist for compression hose   Time 3   Period Weeks   Status New           OT Long Term Goals - 11/30/15 1302      OT LONG TERM GOAL #1   Title Pt will be Mod I-supervision memory strategies    Time 6   Period Weeks   Status New     OT LONG TERM GOAL #2   Title Pt will be supervision level for simple meal prep/snack in ADL kitchen w/o LOB & adhering to safety when reaching for items in cabinets, stove, dishwasher and refridgerator    Time 6   Period Weeks   Status New     OT LONG TERM GOAL #3   Title Pt will be Mod I LB dressing w/ or w/o a/e PRN sit to stand   Time 6   Period Weeks   Status New  OT LONG TERM GOAL #4   Title Pt will demonstrate improved coordination as seen by imrpoved 9 hole peg score by 5 or more seconds left non-dominant hand   Time 6   Period Weeks   Status New     OT LONG TERM GOAL #5   Title Pt will be supervsion for simulated tub transfer using DME PRN for safety   Time 6   Period Weeks   Status New               Plan - 12/15/15 1227    Clinical Impression Statement Pt is a 82 y/o RHD female whom presents to  out-pt Neuro Rehab/OT with her daughter and caregiver today with dx:  Decreased ability to perform self care tasks/Generalized weakness. Pt,  family and caregiver were educated in Role of OT and findings from  assessment that indicate that pt should benefit from out-pt OT to address memory compensation strategies (as pt retetitively asked same questions  over and over during evaluation and family/caregiver report h/o what appears to be age related STM  difficulties at baseline, decreased safety awareness/awareness of deficits,; ADL/selfcare retraining/simple meal prep and UB strengthening  as tolerated. Pt, family and caregiver also report balance and gait issues during functional ambulation & should also benefit from PT assessment secondary to c/o R knee "Buckling" during ambulation, h/o balance issues and fear of falling. Pt/family/caregiver expressed agreement with this in clinic today.    Rehab Potential Fair   Clinical Impairments Affecting Rehab Potential Age related memory issues; Pt wish to return to living alone; need for R TKR per pt/family report   OT Frequency 2x / week   OT Duration 6 weeks   OT Treatment/Interventions Self-care/ADL training;Therapeutic exercise;DME and/or AE instruction;Building services engineer;Therapeutic activities;Patient/family education;Balance training;Cognitive remediation/compensation;Energy conservation;Therapeutic exercises   Plan ADL's - LB dressing techniques, safety and memory strategies home program   Recommended Other Services Physical Therapy Assessment for gait and balance related issues   Consulted and Agree with Plan of Care Patient;Family member/caregiver   Family Member Consulted Daughter and caregiver whom were present throughout assessment      Patient will benefit from skilled therapeutic intervention in order to improve the following deficits and impairments:  Abnormal gait, Decreased balance, Decreased mobility  Visit Diagnosis: Muscle weakness (generalized) - Plan: Ot plan of care cert/re-cert  Other lack of coordination - Plan: Ot plan of care cert/re-cert  Other  abnormalities of gait and mobility - Plan: Ot plan of care cert/re-cert      G-Codes - 2015-12-15 1255    Functional Assessment Tool Used Clinical judgement; 9 hole peg test   Functional Limitation Self care   Self Care Current Status (A5409) At least 40 percent but less than 60 percent impaired, limited or restricted   Self Care Goal Status (W1191) At least 1 percent but less than 20 percent impaired, limited or restricted      Problem List Patient Active Problem List   Diagnosis Date Noted  . Self-care deficit in patient living alone 11/08/2015  . Peripheral nerve contusion 08/15/2015  . Foot drop, left 07/18/2015  . S/P total knee arthroplasty 05/29/2015  . Bleeding disorder (HCC) 05/22/2015  . Primary osteoarthritis of both knees 05/04/2015  . Nonspecific vaginitis 04/11/2015  . Recurrent UTI 03/07/2015  . Joint mouse 03/07/2015  . Right shoulder pain 01/18/2015  . Pre-operative cardiovascular examination 12/07/2014  . Loss of weight 10/13/2013  . Constipation 10/06/2013  . Venous insufficiency (chronic) (peripheral) 02/02/2013  .  Knee pain 12/16/2012  . Arthritis of knee, degenerative 10/29/2012  . Edema 07/23/2012  . Abnormality of gait 07/23/2012  . Unspecified urinary incontinence 07/23/2012  . Hyperlipidemia 07/23/2012  . Controlled type 2 DM with peripheral circulatory disorder (HCC) 07/23/2012  . GERD (gastroesophageal reflux disease) 06/29/2011  . HTN (hypertension) 06/29/2011  . Intertrochanteric fracture of left femur (HCC) 06/29/2011  . Arthritis 06/29/2011  . Hypothyroid 06/29/2011    Alm Bustard, OTR/L 11/30/2015, 1:12 PM  [place] [facility] [address] [place], [place], [address] Phone: [phone]   Fax:  [phone]  Name: CHIOMA MUKHERJEE MRN: [id number] Date of Birth: 1922-08-06

## 2015-12-06 ENCOUNTER — Ambulatory Visit (INDEPENDENT_AMBULATORY_CARE_PROVIDER_SITE_OTHER): Payer: Medicare Other | Admitting: Internal Medicine

## 2015-12-06 ENCOUNTER — Ambulatory Visit: Payer: Medicare Other | Attending: Internal Medicine | Admitting: *Deleted

## 2015-12-06 ENCOUNTER — Encounter: Payer: Self-pay | Admitting: *Deleted

## 2015-12-06 ENCOUNTER — Encounter: Payer: Self-pay | Admitting: Internal Medicine

## 2015-12-06 VITALS — BP 118/67 | HR 69 | Temp 97.5°F | Ht <= 58 in | Wt 134.0 lb

## 2015-12-06 DIAGNOSIS — R1032 Left lower quadrant pain: Secondary | ICD-10-CM | POA: Diagnosis not present

## 2015-12-06 DIAGNOSIS — N3944 Nocturnal enuresis: Secondary | ICD-10-CM | POA: Diagnosis not present

## 2015-12-06 DIAGNOSIS — R278 Other lack of coordination: Secondary | ICD-10-CM

## 2015-12-06 DIAGNOSIS — R2689 Other abnormalities of gait and mobility: Secondary | ICD-10-CM

## 2015-12-06 DIAGNOSIS — M6281 Muscle weakness (generalized): Secondary | ICD-10-CM | POA: Diagnosis not present

## 2015-12-06 NOTE — Progress Notes (Signed)
Facility  [place]    Place of Service:   OFFICE    No Known Allergies  Chief Complaint  Patient presents with  . Acute Visit    Pain in left lower abdominal side that comes and goes x several months, here with Levada Dy   . Medical Management of Chronic Issues    3-4 month follow-up     HPI:  Pain in the LLQ of the Abd. Occurs 1-2 times weekly. No nausea. No fever.  Still having urinary incontinence. Asking about surgery.  Medications: Patient's Medications  New Prescriptions   No medications on file  Previous Medications   ASPIRIN EC 81 MG TABLET    Take 81 mg by mouth daily.   BAYER MICROLET LANCETS LANCETS    Use as instructed to check blood sugar once daily.250.00   FERROUS SULFATE 325 (65 FE) MG TABLET    Take 325 mg by mouth daily.    FUROSEMIDE (LASIX) 40 MG TABLET    Take one tablet by mouth once daily for edema   GLUCOSE BLOOD (BAYER CONTOUR TEST) TEST STRIP    Use as instructed to check blood sugar once daily. 250.00   LEVOTHYROXINE (SYNTHROID, LEVOTHROID) 25 MCG TABLET    Take one tablet by mouth once daily for thyroid supplement   LOSARTAN (COZAAR) 50 MG TABLET    Take one tablet by mouth once daily to control blood pressure   MULTIVITAMIN-IRON-MINERALS-FOLIC ACID (CENTRUM) CHEWABLE TABLET    Chew 1 tablet by mouth daily.  Modified Medications   No medications on file  Discontinued Medications   FAMOTIDINE (PEPCID) 20 MG TABLET    Take 1 tablet (20 mg total) by mouth at bedtime.    Review of Systems  Constitutional: Negative for activity change, appetite change, chills, fatigue, fever and unexpected weight change.  HENT: Negative.   Eyes: Negative.   Respiratory: Negative.  Negative for cough and shortness of breath.   Cardiovascular: Positive for leg swelling. Negative for chest pain and palpitations.  Gastrointestinal: Positive for abdominal pain (left sided) and constipation. Negative for abdominal distention, diarrhea and vomiting.       Incontinent of  stool and urine sometimes  Endocrine: Negative for cold intolerance, heat intolerance, polydipsia, polyphagia and polyuria.  Genitourinary:       Incontinent. Vaginal itching and perirectal itching without discharge. No blood in the stool. No fever.  Musculoskeletal: Positive for arthralgias (Right knee and right shoulder), back pain, gait problem, joint swelling and myalgias.       Right shoulder pain. Right knee pain and weakness.  Skin: Negative.   Neurological: Positive for weakness (Left leg) and numbness (Left foot).       Left foot drop.  Hematological: Negative.   Psychiatric/Behavioral: Positive for decreased concentration.    Vitals:   12/06/15 1217  BP: 118/67  Pulse: 69  Temp: 97.5 F (36.4 C)  TempSrc: Oral  SpO2: 97%  Weight: 134 lb (60.8 kg)  Height: 4' 10" (1.473 m)   Body mass index is 28.01 kg/m. Wt Readings from Last 3 Encounters:  12/06/15 134 lb (60.8 kg)  11/14/15 139 lb (63 kg)  10/23/15 136 lb (61.7 kg)      Physical Exam  Constitutional: She is oriented to person, place, and time. She appears well-developed and well-nourished. No distress.  frail  HENT:  Head: Normocephalic and atraumatic.  Left Ear: External ear normal.  Nose: Nose normal.  Eyes: Conjunctivae and EOM are normal. Pupils are equal,  round, and reactive to light.  Neck: Normal range of motion. Neck supple. No JVD present. No tracheal deviation present. No thyromegaly present.  Cardiovascular: Normal rate, regular rhythm, normal heart sounds and intact distal pulses.  Exam reveals no gallop and no friction rub.   No murmur heard. Varicose veins.  Pulmonary/Chest: Effort normal and breath sounds normal. No respiratory distress. She has no wheezes. She has no rales.  Abdominal: Soft. Bowel sounds are normal. She exhibits no distension and no mass. There is no tenderness.  Musculoskeletal: She exhibits edema (1+ right and 2+ edema) and tenderness (both knees).  Poor balance. Using  walker. Pain at the right shoulder and some crepitance noted with rotational movements. Right knee pain. Left knee status post TKR. Left foot drop. Significant weakness with attempts at dorsiflexion.  Lymphadenopathy:    She has no cervical adenopathy.  Neurological: She is alert and oriented to person, place, and time. No cranial nerve deficit. Coordination normal.  Decreased sensation left foot. Left foot drop and weakness with dorsiflexion.  Skin: No rash noted. No erythema. No pallor.  Seborrheic keratoses. Healed left TKR scar.  Psychiatric: She has a normal mood and affect. Her behavior is normal. Thought content normal.    Labs reviewed: Lab Summary Latest Ref Rng & Units 10/23/2015 07/21/2015  Hemoglobin 12.0 - 15.0 g/dL 11.2(L) (None)  Hematocrit 36.0 - 46.0 % 36.4 (None)  White count 4.0 - 10.5 K/uL 6.4 (None)  Platelet count 150 - 400 K/uL 176 (None)  Sodium 135 - 145 mmol/L 136 140  Potassium 3.5 - 5.1 mmol/L 4.2 4.7  Calcium 8.9 - 10.3 mg/dL 9.2 9.2  Phosphorus - (None) (None)  Creatinine 0.44 - 1.00 mg/dL 1.12(H) 0.91  AST 15 - 41 U/L 26 16  Alk Phos 38 - 126 U/L 75 89  Bilirubin 0.3 - 1.2 mg/dL 0.4 0.3  Glucose 65 - 99 mg/dL 115(H) 109(H)  Cholesterol - (None) (None)  HDL cholesterol - (None) (None)  Triglycerides - (None) (None)  LDL Direct - (None) (None)  LDL Calc - (None) (None)  Total protein 6.5 - 8.1 g/dL 6.7 (None)  Albumin 3.5 - 5.0 g/dL 3.7 3.8  Some recent data might be hidden   Lab Results  Component Value Date   TSH 1.030 07/21/2015   TSH 0.755 12/02/2014   TSH 1.040 05/05/2014   Lab Results  Component Value Date   BUN 38 (H) 10/23/2015   BUN 30 07/21/2015   BUN 24 (A) 06/05/2015   Lab Results  Component Value Date   HGBA1C 6.0 (H) 07/21/2015   HGBA1C 6.1 (H) 05/19/2015   HGBA1C 6.1 (H) 12/02/2014    Assessment/Plan  1. LLQ abdominal pain I am most suspicious this is a gas related problem. - CT ABDOMEN PELVIS W WO CONTRAST;  Future  2. Nocturnal enuresis I do not think surgery is a reasonable option for her.

## 2015-12-06 NOTE — Therapy (Signed)
[facility] [address] [place], [place], [address] Phone: [phone]   Fax:  [phone]  Occupational Therapy Treatment  Patient Details  Name: Jessica Hawkins MRN: [id number] Date of Birth: 06/28/1922 Referring Provider: Dr Murray Hodgkins  Encounter Date: 12/06/2015      OT End of Session - 12/06/15 1202    Visit Number 2   Number of Visits 12   Date for OT Re-Evaluation 01/25/16   Authorization Type Medicare Part A & Medicare Part B   Authorization Time Period G code every 10th visit   Authorization - Visit Number 2   Authorization - Number of Visits 10   OT Start Time 1100   OT Stop Time 1149   OT Time Calculation (min) 49 min   Activity Tolerance Patient tolerated treatment well   Behavior During Therapy [facility] for tasks assessed/performed      Past Medical History:  Diagnosis Date  . Acute blood loss anemia   . Anginal pain [facility])    'i haven't had them in a long time but i used to'  . Arthritis    Osteoarthritis  . BBB (bundle branch block)    RT  . Coronary atherosclerosis of unspecified type of vessel, native or graft   . Debility, unspecified   . Diabetes mellitus without complication (HCC)    Diet controlled  . Diaphragmatic hernia without mention of obstruction or gangrene   . Disturbance of skin sensation   . Edema   . Fibrocystic breast   . Gait disorder   . GERD (gastroesophageal reflux disease) 06/29/2011  . Hiatal hernia   . History of transfusion of packed red blood cells   . Hyperlipidemia   . Hypertension 06/29/11  . Hypothyroid 06/29/2011  . Insomnia   . OAB (overactive bladder)   . Osteoporosis   . Platelet disorder (HCC)    ? Von Willebrand; abnormal PLT function assay 07/04/11   . Unspecified constipation   . Unspecified sleep apnea   . Unspecified urinary incontinence   . Unspecified vitamin D deficiency   . Unsteady gait   . Vertigo     Past Surgical History:  Procedure  Laterality Date  . EYE SURGERY Right 2005   Cataract surgery  . EYE SURGERY Left 2006   Cataract surgery  . FEMUR IM NAIL  06/29/2011   Procedure: INTRAMEDULLARY (IM) NAIL FEMORAL;  Surgeon: Verlee Rossetti, MD;  Location: [facility] OR;  Service: Orthopedics;  Laterality: Left;  . HIP SURGERY Right 2002   ORIF  . ROTATOR CUFF REPAIR Left 1999   Tear  . THYROIDECTOMY  1952  . TONSILLECTOMY    . TOTAL KNEE ARTHROPLASTY Left 05/29/2015   Procedure: TOTAL KNEE ARTHROPLASTY;  Surgeon: Dannielle Huh, MD;  Location: [facility] OR;  Service: Orthopedics;  Laterality: Left;    There were no vitals filed for this visit.      Subjective Assessment - 12/06/15 1107    Subjective  Pt reports "I had a episode of buckling yesterday, I didn't fall" Pt reports that she is having a R knee replacement 01/01/16.   Patient is accompained by: --  Caregiver   Patient Stated Goals Improve balance    Currently in Pain? No/denies            [facility] OT Assessment - 12/06/15 0001      Assessment   Diagnosis Generalized weakness   Referring Provider Dr Murray Hodgkins   Onset Date --  ongoing  Precautions   Precautions Fall                  OT Treatments/Exercises (OP) - 12/06/15 0001      ADLs   Overall ADLs Pt reports son or other family member has been assisting with compression socks/hose.    ADL Comments Pt sat in chair to doff sneakers. Discussed use of small step stool for dressing PRN, recommend LH shoehorn when donning sneakers. Discussed use on LH reacher PRN and keeping items near by for safety. Also discussed gathering items ahead of time before performing ADL's/dressing, sit for safety. Pt requires assistance for thigh high compression hose. Discussed safety with RW. Pt noed to carry items in her hands while also using 4 wheeled walker (today, pt had cane, purse, lunch type bag and was trying to carry all at once). Decreased safety awareness even after discussion as pt cont to "do it her way"  stating "I need this, and I need that cane" etc. Recommend using only walker and placing purse across her body & not carrying any other items at same time. Pt caregiver states that this is common.   ADL Education Given Yes   Long-handled Shoe Horn Pt Mod I donning shoes w/ this while seated    Increased Safety Strategies Sit for ADL's, use a/e PRN and gather items before dressing/bathing etc.   Precautions Safety     Cognitive Exercises   Other Cognitive Exercises 1 Decreased STM; decreased safety awareness   Other Cognitive Exercises 2 Issued and reviewed memory strategies with pt and caregiver today in clinic today.       Memory Compensation Strategies  1. Use "WARM" strategy.  W= write it down  A= associate it  R= repeat it  M= make a mental note  2.   You can keep a Glass blower/designer.  Use a 3-ring notebook with sections for the following: calendar, important names and phone numbers, medications, doctors' names/phone numbers, lists/reminders, and a section to journal what you did  each day.   3.    Use a calendar to write appointments down.  4.    Write yourself a schedule for the day.  This can be placed on the calendar or in a separate section of the Memory Notebook.  Keeping a  regular schedule can help memory.  5.    Use medication organizer with sections for each day or morning/evening pills.  You may need help loading it  6.    Keep a basket, or pegboard by the door.  Place items that you need to take out with you in the basket or on the pegboard.  You may also want to include a message board for reminders.  7.    Use sticky notes.  Place sticky notes with reminders in a place where the task is performed.  For example: " turn off the stove" placed by the stove, "lock the door" placed on the door at eye level, " take your medications" on the bathroom mirror or by the place where you normally take your medications.  8.    Use alarms/timers.  Use while cooking to remind  yourself to check on food or as a reminder to take your medicine, or as a reminder to make a call, or as a reminder to perform another task, etc.           OT Education - 12/06/15 1201    Education provided Yes   Education Details Memory strategies,  ADL's/safety   Person(s) Educated Patient;Caregiver(s)   Methods Explanation;Demonstration;Verbal cues;Tactile cues;Handout   Comprehension Verbalized understanding;Need further instruction;Verbal cues required;Tactile cues required          OT Short Term Goals - 11/30/15 1301      OT SHORT TERM GOAL #1   Title Pt Min assist for memory strategies   Baseline Consistent/Mod vc's and repetetion for new information;    Time 3   Period Weeks   Status New     OT SHORT TERM GOAL #2   Title Pt will be Min A safety atrategies in the kitchen for simple meal prep   Baseline Currently having meals prepared by caregiver/family   Time 3   Period Weeks   Status New     OT SHORT TERM GOAL #3   Title Pt will be supervision for LB dressing and ADL's w/ a/e PRN   Baseline Supervisoin LB ADL's except max assist for compression hose   Time 3   Period Weeks   Status New           OT Long Term Goals - 11/30/15 1302      OT LONG TERM GOAL #1   Title Pt will be Mod I-supervision memory strategies    Time 6   Period Weeks   Status New     OT LONG TERM GOAL #2   Title Pt will be supervision level for simple meal prep/snack in ADL kitchen w/o LOB & adhering to safety when reaching for items in cabinets, stove, dishwasher and refridgerator    Time 6   Period Weeks   Status New     OT LONG TERM GOAL #3   Title Pt will be Mod I LB dressing w/ or w/o a/e PRN sit to stand   Time 6   Period Weeks   Status New     OT LONG TERM GOAL #4   Title Pt will demonstrate improved coordination as seen by imrpoved 9 hole peg score by 5 or more seconds left non-dominant hand   Time 6   Period Weeks   Status New     OT LONG TERM GOAL #5   Title  Pt will be supervsion for simulated tub transfer using DME PRN for safety   Time 6   Period Weeks   Status New               Plan - 12/06/15 1203    Clinical Impression Statement Pt should benefit from memory strategies and memory compensation techniques. Difficulty w/ follow through/carry over as appears to demonstrate STM deficits at baseline. Cont to recommend PT Orders for Eval and Treat per pt/family reports knee buckling and fall/balance risk.   Rehab Potential Fair   Clinical Impairments Affecting Rehab Potential Age related memory issues; Pt wish to return to living alone; need for R TKR per pt/family report   OT Frequency 2x / week   OT Duration 6 weeks   OT Treatment/Interventions Self-care/ADL training;Therapeutic exercise;DME and/or AE instruction;Building services engineer;Therapeutic activities;Patient/family education;Balance training;Cognitive remediation/compensation;Energy conservation;Therapeutic exercises   Plan Safety strategies for home, review memory strategies & ADL's if time.   Recommended Other Services Physical Therapy Evaluaiton for gait and balance related issues   Consulted and Agree with Plan of Care Patient;Family member/caregiver   Family Member Consulted Caregiver - Angie      Patient will benefit from skilled therapeutic intervention in order to improve the following deficits and impairments:  Abnormal gait, Decreased balance, Decreased  mobility, Difficulty walking, Decreased cognition, Decreased activity tolerance, Decreased knowledge of use of DME, Decreased safety awareness, Decreased coordination, Impaired perceived functional ability, Decreased knowledge of precautions  Visit Diagnosis: Muscle weakness (generalized)  Other lack of coordination  Other abnormalities of gait and mobility    Problem List Patient Active Problem List   Diagnosis Date Noted  . Self-care deficit in patient living alone 11/08/2015  . Peripheral nerve  contusion 08/15/2015  . Foot drop, left 07/18/2015  . S/P total knee arthroplasty 05/29/2015  . Bleeding disorder (HCC) 05/22/2015  . Primary osteoarthritis of both knees 05/04/2015  . Nonspecific vaginitis 04/11/2015  . Recurrent UTI 03/07/2015  . Joint mouse 03/07/2015  . Right shoulder pain 01/18/2015  . Pre-operative cardiovascular examination 12/07/2014  . Loss of weight 10/13/2013  . Constipation 10/06/2013  . Venous insufficiency (chronic) (peripheral) 02/02/2013  . Knee pain 12/16/2012  . Arthritis of knee, degenerative 10/29/2012  . Edema 07/23/2012  . Abnormality of gait 07/23/2012  . Unspecified urinary incontinence 07/23/2012  . Hyperlipidemia 07/23/2012  . Controlled type 2 DM with peripheral circulatory disorder (HCC) 07/23/2012  . GERD (gastroesophageal reflux disease) 06/29/2011  . HTN (hypertension) 06/29/2011  . Intertrochanteric fracture of left femur (HCC) 06/29/2011  . Arthritis 06/29/2011  . Hypothyroid 06/29/2011    Alm BustardBarnhill, Emmajo Bennette Beth Dixon, OTR/L 12/06/2015, 12:10 PM  [facility] [address] [place], [place], [address] Phone: [phone]   Fax:  [phone]  Name: Jessica Hawkins MRN: [id number] Date of Birth: Sep 11, 1922

## 2015-12-06 NOTE — Patient Instructions (Addendum)

## 2015-12-08 NOTE — Addendum Note (Signed)
Addended by: Nelda SevereMAY, ANITA A on: 12/08/2015 10:13 AM   Modules accepted: Orders

## 2015-12-11 ENCOUNTER — Telehealth: Payer: Self-pay | Admitting: Internal Medicine

## 2015-12-11 NOTE — Telephone Encounter (Signed)
[employer] has denied the request for CT Abdomen & Pelvis for your patient  As the Order physician you have the option to call and speak with a physician reviewer  [phone]   Zollie Beckersauline Janco Member ID: [id number]  Please advise

## 2015-12-12 ENCOUNTER — Ambulatory Visit
Admission: RE | Admit: 2015-12-12 | Discharge: 2015-12-12 | Disposition: A | Discharge status: Home / Self Care | Payer: Medicare Other | Source: Ambulatory Visit | Attending: Internal Medicine | Admitting: Internal Medicine

## 2015-12-12 DIAGNOSIS — R1032 Left lower quadrant pain: Secondary | ICD-10-CM | POA: Diagnosis not present

## 2015-12-12 MED ORDER — IOPAMIDOL (ISOVUE-300) INJECTION 61%
100.0000 mL | Freq: Once | INTRAVENOUS | Status: AC | PRN
  Administered 2015-12-12: 100 mL via INTRAVENOUS

## 2015-12-13 ENCOUNTER — Ambulatory Visit: Payer: Medicare Other | Admitting: Internal Medicine

## 2015-12-14 ENCOUNTER — Other Ambulatory Visit: Payer: Self-pay | Admitting: Orthopedic Surgery

## 2015-12-14 DIAGNOSIS — M1711 Unilateral primary osteoarthritis, right knee: Secondary | ICD-10-CM | POA: Diagnosis not present

## 2015-12-14 NOTE — Telephone Encounter (Signed)
This test was completed on 12/12/15. Report I in the chart. Results have been given to her caretaker. Do I need to call anyone?

## 2015-12-14 NOTE — Telephone Encounter (Signed)
I am not sure that [employer] would accept it past the appointment date but you could try?  If the [facility]cert [facility] will be responsible for 20% of charges. (Family was aware this was possible when they decided to keep appointment without authorization)

## 2015-12-15 ENCOUNTER — Telehealth: Payer: Self-pay | Admitting: *Deleted

## 2015-12-15 ENCOUNTER — Other Ambulatory Visit: Payer: Self-pay | Admitting: Internal Medicine

## 2015-12-15 MED ORDER — MICONAZOLE NITRATE 200 MG VA SUPP: VAGINAL | 0 refills | Status: DC

## 2015-12-15 NOTE — Telephone Encounter (Signed)
Prescription for Monistat was sent to her pharmacy.

## 2015-12-15 NOTE — Telephone Encounter (Signed)
Patient aware.

## 2015-12-15 NOTE — Telephone Encounter (Signed)
Patient called and stated that she is itching in her vaginal area and wants to know if you will call her something in for this. Stated that you have in the past. Please Advise.

## 2015-12-16 ENCOUNTER — Encounter: Payer: Self-pay | Admitting: Internal Medicine

## 2015-12-17 ENCOUNTER — Encounter: Payer: Self-pay | Admitting: Internal Medicine

## 2015-12-18 ENCOUNTER — Telehealth: Payer: Self-pay

## 2015-12-18 NOTE — Telephone Encounter (Signed)
I called Jessica Hawkins to let her know that the only slot available on Dr. Thomasene LotGreen's schedule is Wed 12/20/15. Jessica Hawkins agreed to this time.

## 2015-12-19 ENCOUNTER — Ambulatory Visit: Payer: Medicare Other | Admitting: *Deleted

## 2015-12-19 ENCOUNTER — Encounter: Payer: Self-pay | Admitting: *Deleted

## 2015-12-19 DIAGNOSIS — M6281 Muscle weakness (generalized): Secondary | ICD-10-CM

## 2015-12-19 DIAGNOSIS — R2689 Other abnormalities of gait and mobility: Secondary | ICD-10-CM | POA: Diagnosis not present

## 2015-12-19 DIAGNOSIS — R278 Other lack of coordination: Secondary | ICD-10-CM

## 2015-12-19 NOTE — Therapy (Signed)
[facility] [address] [place], [place], [address] Phone: [phone]   Fax:  [phone]  Occupational Therapy Treatment  Patient Details  Name: Jessica Hawkins MRN: [id number] Date of Birth: January 21, 1923 Referring Provider: Dr Murray HodgkinsArthur Green  Encounter Date: 12/19/2015      OT End of Session - 12/19/15 1148    Visit Number 3   Number of Visits 12   Date for OT Re-Evaluation 01/25/16   Authorization Type Medicare Part A & Medicare Part B   Authorization Time Period G code every 10th visit   Authorization - Visit Number 3   Authorization - Number of Visits 10   OT Start Time [phone]   OT Stop Time 1016   OT Time Calculation (min) 45 min   Activity Tolerance Patient tolerated treatment well   Behavior During Therapy [facility] for tasks assessed/performed      Past Medical History:  Diagnosis Date  . Acute blood loss anemia   . Anginal pain [facility](HCC)    'i haven't had them in a long time but i used to'  . Arthritis    Osteoarthritis  . BBB (bundle branch block)    RT  . Coronary atherosclerosis of unspecified type of vessel, native or graft   . Debility, unspecified   . Diabetes mellitus without complication (HCC)    Diet controlled  . Diaphragmatic hernia without mention of obstruction or gangrene   . Disturbance of skin sensation   . Edema   . Fibrocystic breast   . Gait disorder   . GERD (gastroesophageal reflux disease) 06/29/2011  . Hiatal hernia   . History of transfusion of packed red blood cells   . Hyperlipidemia   . Hypertension 06/29/11  . Hypothyroid 06/29/2011  . Insomnia   . OAB (overactive bladder)   . Osteoporosis   . Platelet disorder (HCC)    ? Von Willebrand; abnormal PLT function assay 07/04/11   . Unspecified constipation   . Unspecified sleep apnea   . Unspecified urinary incontinence   . Unspecified vitamin D deficiency   . Unsteady gait   . Vertigo     Past Surgical History:  Procedure  Laterality Date  . EYE SURGERY Right 2005   Cataract surgery  . EYE SURGERY Left 2006   Cataract surgery  . FEMUR IM NAIL  06/29/2011   Procedure: INTRAMEDULLARY (IM) NAIL FEMORAL;  Surgeon: Verlee RossettiSteven R Norris, MD;  [facility];  Service: Orthopedics;  Laterality: Left;  . HIP SURGERY Right 2002   ORIF  . ROTATOR CUFF REPAIR Left 1999   Tear  . THYROIDECTOMY  1952  . TONSILLECTOMY    . TOTAL KNEE ARTHROPLASTY Left 05/29/2015   Procedure: TOTAL KNEE ARTHROPLASTY;  Surgeon: Dannielle HuhSteve Lucey, MD;  Location: [facility] OR;  Service: Orthopedics;  Laterality: Left;    There were no vitals filed for this visit.      Subjective Assessment - 12/19/15 0939    Subjective  Pt reports "I wasn't feeling good on saturday and sunday"    Patient is accompained by: --  Caregiver   Patient Stated Goals Improve balance    Currently in Pain? No/denies                      OT Treatments/Exercises (OP) - 12/19/15 0001      ADLs   ADL Comments Pt/caregiver Jessica Land(Hawkins) instruction today for home safety and fall prevention techniques. Discussed safety in bathroom, kitchen, bedroom,  general safety techniques and recomendations. Handouts were issued and reviewed with pt/caregiver today and examples were given/discussed as well.   ADL Education Given Yes  Discussed home set up and reccomendations   Precautions Safety/Falls prevention   General Comments Home safety recommendations reviewed with pt/caregiver.       Bathing  Fall Prevention in the Home  Falls can cause injuries and can affect people from all age groups. There are many simple things that you can do to make your home safe and to help prevent falls. WHAT CAN I DO ON THE OUTSIDE OF MY HOME?  Regularly repair the edges of walkways and driveways and fix any cracks.  Remove high doorway thresholds. Trim any shrubbery on the main path into your home.Bathing  Use bright outdoor lighting.  Clear walkways of debris and clutter, including  tools and rocks.  Regularly check that handrails are securely fastened and in good repair. Both sides of any steps should have handrails.  Install guardrails along the edges of any raised decks or porches.  Have leaves, snow, and ice cleared regularly.  Use sand or salt on walkways during winter months.  In the garage, clean up any spills right away, including grease or oil spills. WHAT CAN I DO IN THE BATHROOM?  Use night lights.  Install grab bars by the toilet and in the tub and shower. Do not use towel bars as grab bars.  Use non-skid mats or decals on the floor of the tub or shower.  If you need to sit down while you are in the shower, use a plastic, non-slip stool.Marland Kitchen  Keep the floor dry. Immediately clean up any water that spills on the floor.  Remove soap buildup in the tub or shower on a regular basis.  Attach bath mats securely with double-sided non-slip rug tape.  Remove throw rugs and other tripping hazards from the floor. WHAT CAN I DO IN THE BEDROOM?  Use night lights.  Make sure that a bedside light is easy to reach.  Do not use oversized bedding that drapes onto the floor.  Have a firm chair that has side arms to use for getting dressed.  Remove throw rugs and other tripping hazards from the floor. WHAT CAN I DO IN THE KITCHEN?   Clean up any spills right away.  Avoid walking on wet floors.  Place frequently used items in easy-to-reach places.  If you need to reach for something above you, use a sturdy step stool that has a grab bar.  Keep electrical cables out of the way.  Do not use floor polish or wax that makes floors slippery. If you have to use wax, make sure that it is non-skid floor wax.  Remove throw rugs and other tripping hazards from the floor. WHAT CAN I DO IN THE STAIRWAYS?  Do not leave any items on the stairs.  Make sure that there are handrails on both sides of the stairs. Fix handrails that are broken or loose. Make sure that  handrails are as long as the stairways.  Check any carpeting to make sure that it is firmly attached to the stairs. Fix any carpet that is loose or worn.  Avoid having throw rugs at the top or bottom of stairways, or secure the rugs with carpet tape to prevent them from moving.  Make sure that you have a light switch at the top of the stairs and the bottom of the stairs. If you do not have them, have them  installed. WHAT ARE SOME OTHER FALL PREVENTION TIPS?  Wear closed-toe shoes that fit well and support your feet. Wear shoes that have rubber soles or low heels.  When you use a stepladder, make sure that it is completely opened and that the sides are firmly locked. Have someone hold the ladder while you are using it. Do not climb a closed stepladder.  Add color or contrast paint or tape to grab bars and handrails in your home. Place contrasting color strips on the first and last steps.  Use mobility aids as needed, such as canes, walkers, scooters, and crutches.  Turn on lights if it is dark. Replace any light bulbs that burn out.  Set up furniture so that there are clear paths. Keep the furniture in the same spot.  Fix any uneven floor surfaces.  Choose a carpet design that does not hide the edge of steps of a stairway.  Be aware of any and all pets.  Review your medicines with your healthcare provider. Some medicines can cause dizziness or changes in blood pressure, which increase your risk of falling. Talk with your health care provider about other ways that you can decrease your risk of falls. This may include working with a physical therapist or trainer to improve your strength, balance, and endurance.   This information is not intended to replace advice given to you by your health care provider. Make sure you discuss any questions you have with your health care provider.   Document Released: 02/08/2002 Document Revised: 07/05/2014 Document Reviewed: 03/25/2014 Elsevier  Interactive Patient Education 2016 [employer].   Place all items within easy reach to avoid bending and twisting. Use long-handled brush to soap and hand-held shower to rinse off. Step on non-skid floor mat to dry feet off. Make sure that tub or shower has non-skid bottom, or use rubber mat to avoid slipping. Safety bars are also recommended.   Bathing: Promoting Independence and Safety    Use long-handled brush or sponge to soap hard-to-reach areas. Hand-held shower directs water flow for rinsing off. Sit on bench with legs out of tub to towel dry.    Place all items within easy reach to avoid bending and twisting. Use long-handled brush to soap and hand-held shower to rinse off. Step on non-skid floor mat to dry feet off. Make sure that tub or shower has non-skid bottom, or use rubber mat to avoid slipping. Safety bars are also recommended.  Kitchen Clean Up    Place towel on work surface and washable non-skid mat on floor to minimize need to wipe counter or mop floor. Have chair or stool close by to sit and conserve energy. Gather items in kitchen ahead of time: ie: get all items needed for making a sandwich and then sit at table or on chair at counter while you complete the task at hand.           OT Education - 12/19/15 1147    Education provided Yes   Education Details ADL/Fall prevention strategies   Person(s) Educated Patient;Caregiver(s)   Methods Explanation;Demonstration;Verbal cues;Handout   Comprehension Verbalized understanding;Verbal cues required          OT Short Term Goals - 11/30/15 1301      OT SHORT TERM GOAL #1   Title Pt Min assist for memory strategies   Baseline Consistent/Mod vc's and repetetion for new information;    Time 3   Period Weeks   Status New     OT SHORT  TERM GOAL #2   Title Pt will be Min A safety atrategies in the kitchen for simple meal prep   Baseline Currently having meals prepared by caregiver/family   Time 3     Period Weeks   Status New     OT SHORT TERM GOAL #3   Title Pt will be supervision for LB dressing and ADL's w/ a/e PRN   Baseline Supervisoin LB ADL's except max assist for compression hose   Time 3   Period Weeks   Status New           OT Long Term Goals - 11/30/15 1302      OT LONG TERM GOAL #1   Title Pt will be Mod I-supervision memory strategies    Time 6   Period Weeks   Status New     OT LONG TERM GOAL #2   Title Pt will be supervision level for simple meal prep/snack in ADL kitchen w/o LOB & adhering to safety when reaching for items in cabinets, stove, dishwasher and refridgerator    Time 6   Period Weeks   Status New     OT LONG TERM GOAL #3   Title Pt will be Mod I LB dressing w/ or w/o a/e PRN sit to stand   Time 6   Period Weeks   Status New     OT LONG TERM GOAL #4   Title Pt will demonstrate improved coordination as seen by imrpoved 9 hole peg score by 5 or more seconds left non-dominant hand   Time 6   Period Weeks   Status New     OT LONG TERM GOAL #5   Title Pt will be supervsion for simulated tub transfer using DME PRN for safety   Time 6   Period Weeks   Status New               Plan - 12/19/15 1148    Clinical Impression Statement Pt should benefit from implementation of home safety techniques and falls prevention strategies as discussed today in clinic. STM deficits at baseline may impact carryover, however, she reports that she has implemented some strategies as discussed in previous sessions w/ good results. Cont to recommend PT Eval/Orders per pt/family reports of knee buckling & fall/balance risk.   Rehab Potential Fair   Clinical Impairments Affecting Rehab Potential Age related memory issues; Pt wish to return to living alone; need for R TKR per pt/family report   OT Frequency 2x / week   OT Duration 6 weeks   OT Treatment/Interventions Self-care/ADL training;Therapeutic exercise;DME and/or AE instruction;TEFL teacher;Therapeutic activities;Patient/family education;Balance training;Cognitive remediation/compensation;Energy conservation;Therapeutic exercises   Plan Review safety strategies, falls prevention for home. Memory strategies and ADL's if time.   Consulted and Agree with Plan of Care Patient;Family member/caregiver   Family Member Consulted Caregiver - Angie      Patient will benefit from skilled therapeutic intervention in order to improve the following deficits and impairments:  Abnormal gait, Decreased balance, Decreased mobility, Difficulty walking, Decreased cognition, Decreased activity tolerance, Decreased knowledge of use of DME, Decreased safety awareness, Decreased coordination, Impaired perceived functional ability, Decreased knowledge of precautions  Visit Diagnosis: Muscle weakness (generalized)  Other lack of coordination  Other abnormalities of gait and mobility    Problem List Patient Active Problem List   Diagnosis Date Noted  . LLQ abdominal pain 12/06/2015  . Self-care deficit in patient living alone 11/08/2015  . Peripheral nerve contusion 08/15/2015  .  Foot drop, left 07/18/2015  . S/P total knee arthroplasty 05/29/2015  . Bleeding disorder (HCC) 05/22/2015  . Primary osteoarthritis of both knees 05/04/2015  . Nonspecific vaginitis 04/11/2015  . Recurrent UTI 03/07/2015  . Joint mouse 03/07/2015  . Right shoulder pain 01/18/2015  . Pre-operative cardiovascular examination 12/07/2014  . Loss of weight 10/13/2013  . Constipation 10/06/2013  . Venous insufficiency (chronic) (peripheral) 02/02/2013  . Knee pain 12/16/2012  . Arthritis of knee, degenerative 10/29/2012  . Edema 07/23/2012  . Abnormality of gait 07/23/2012  . Urinary incontinence 07/23/2012  . Hyperlipidemia 07/23/2012  . Controlled type 2 DM with peripheral circulatory disorder (HCC) 07/23/2012  . GERD (gastroesophageal reflux disease) 06/29/2011  . HTN (hypertension) 06/29/2011   . Intertrochanteric fracture of left femur (HCC) 06/29/2011  . Arthritis 06/29/2011  . Hypothyroid 06/29/2011    Alm Bustard, OTR/L 12/19/2015, 11:52 AM  [facility] [address] [place], [place], [address] Phone: [phone]   Fax:  [phone]  Name: Jessica Hawkins MRN: [id number] Date of Birth: 03/23/1922

## 2015-12-19 NOTE — Patient Instructions (Addendum)
Bathing  Fall Prevention in the Home  Falls can cause injuries and can affect people from all age groups. There are many simple things that you can do to make your home safe and to help prevent falls. WHAT CAN I DO ON THE OUTSIDE OF MY HOME?  Regularly repair the edges of walkways and driveways and fix any cracks.  Remove high doorway thresholds. Trim any shrubbery on the main path into your home.Bathing  Use bright outdoor lighting.  Clear walkways of debris and clutter, including tools and rocks.  Regularly check that handrails are securely fastened and in good repair. Both sides of any steps should have handrails.  Install guardrails along the edges of any raised decks or porches.  Have leaves, snow, and ice cleared regularly.  Use sand or salt on walkways during winter months.  In the garage, clean up any spills right away, including grease or oil spills. WHAT CAN I DO IN THE BATHROOM?  Use night lights.  Install grab bars by the toilet and in the tub and shower. Do not use towel bars as grab bars.  Use non-skid mats or decals on the floor of the tub or shower.  If you need to sit down while you are in the shower, use a plastic, non-slip stool.Marland Kitchen.  Keep the floor dry. Immediately clean up any water that spills on the floor.  Remove soap buildup in the tub or shower on a regular basis.  Attach bath mats securely with double-sided non-slip rug tape.  Remove throw rugs and other tripping hazards from the floor. WHAT CAN I DO IN THE BEDROOM?  Use night lights.  Make sure that a bedside light is easy to reach.  Do not use oversized bedding that drapes onto the floor.  Have a firm chair that has side arms to use for getting dressed.  Remove throw rugs and other tripping hazards from the floor. WHAT CAN I DO IN THE KITCHEN?   Clean up any spills right away.  Avoid walking on wet floors.  Place frequently used items in easy-to-reach places.  If you need to  reach for something above you, use a sturdy step stool that has a grab bar.  Keep electrical cables out of the way.  Do not use floor polish or wax that makes floors slippery. If you have to use wax, make sure that it is non-skid floor wax.  Remove throw rugs and other tripping hazards from the floor. WHAT CAN I DO IN THE STAIRWAYS?  Do not leave any items on the stairs.  Make sure that there are handrails on both sides of the stairs. Fix handrails that are broken or loose. Make sure that handrails are as long as the stairways.  Check any carpeting to make sure that it is firmly attached to the stairs. Fix any carpet that is loose or worn.  Avoid having throw rugs at the top or bottom of stairways, or secure the rugs with carpet tape to prevent them from moving.  Make sure that you have a light switch at the top of the stairs and the bottom of the stairs. If you do not have them, have them installed. WHAT ARE SOME OTHER FALL PREVENTION TIPS?  Wear closed-toe shoes that fit well and support your feet. Wear shoes that have rubber soles or low heels.  When you use a stepladder, make sure that it is completely opened and that the sides are firmly locked. Have someone hold the ladder while  you are using it. Do not climb a closed stepladder.  Add color or contrast paint or tape to grab bars and handrails in your home. Place contrasting color strips on the first and last steps.  Use mobility aids as needed, such as canes, walkers, scooters, and crutches.  Turn on lights if it is dark. Replace any light bulbs that burn out.  Set up furniture so that there are clear paths. Keep the furniture in the same spot.  Fix any uneven floor surfaces.  Choose a carpet design that does not hide the edge of steps of a stairway.  Be aware of any and all pets.  Review your medicines with your healthcare provider. Some medicines can cause dizziness or changes in blood pressure, which increase your risk  of falling. Talk with your health care provider about other ways that you can decrease your risk of falls. This may include working with a physical therapist or trainer to improve your strength, balance, and endurance.   This information is not intended to replace advice given to you by your health care provider. Make sure you discuss any questions you have with your health care provider.   Document Released: 02/08/2002 Document Revised: 07/05/2014 Document Reviewed: 03/25/2014 Elsevier Interactive Patient Education 2016 [employer].   Place all items within easy reach to avoid bending and twisting. Use long-handled brush to soap and hand-held shower to rinse off. Step on non-skid floor mat to dry feet off. Make sure that tub or shower has non-skid bottom, or use rubber mat to avoid slipping. Safety bars are also recommended.   Bathing: Promoting Independence and Safety    Use long-handled brush or sponge to soap hard-to-reach areas. Hand-held shower directs water flow for rinsing off. Sit on bench with legs out of tub to towel dry.    Place all items within easy reach to avoid bending and twisting. Use long-handled brush to soap and hand-held shower to rinse off. Step on non-skid floor mat to dry feet off. Make sure that tub or shower has non-skid bottom, or use rubber mat to avoid slipping. Safety bars are also recommended.  Kitchen Clean Up    Place towel on work surface and washable non-skid mat on floor to minimize need to wipe counter or mop floor. Have chair or stool close by to sit and conserve energy. Gather items in kitchen ahead of time: ie: get all items needed for making a sandwich and then sit at table or on chair at counter while you complete the task at hand.

## 2015-12-20 ENCOUNTER — Ambulatory Visit (INDEPENDENT_AMBULATORY_CARE_PROVIDER_SITE_OTHER): Payer: Medicare Other | Admitting: Internal Medicine

## 2015-12-20 ENCOUNTER — Encounter: Payer: Self-pay | Admitting: Internal Medicine

## 2015-12-20 VITALS — BP 110/80 | HR 86 | Temp 97.6°F | Ht <= 58 in | Wt 137.2 lb

## 2015-12-20 DIAGNOSIS — L03119 Cellulitis of unspecified part of limb: Secondary | ICD-10-CM | POA: Diagnosis not present

## 2015-12-20 DIAGNOSIS — L02419 Cutaneous abscess of limb, unspecified: Secondary | ICD-10-CM | POA: Diagnosis not present

## 2015-12-20 DIAGNOSIS — E1151 Type 2 diabetes mellitus with diabetic peripheral angiopathy without gangrene: Secondary | ICD-10-CM | POA: Diagnosis not present

## 2015-12-20 DIAGNOSIS — I872 Venous insufficiency (chronic) (peripheral): Secondary | ICD-10-CM | POA: Diagnosis not present

## 2015-12-20 DIAGNOSIS — M170 Bilateral primary osteoarthritis of knee: Secondary | ICD-10-CM

## 2015-12-20 DIAGNOSIS — I10 Essential (primary) hypertension: Secondary | ICD-10-CM | POA: Diagnosis not present

## 2015-12-20 DIAGNOSIS — D649 Anemia, unspecified: Secondary | ICD-10-CM | POA: Diagnosis not present

## 2015-12-20 DIAGNOSIS — R609 Edema, unspecified: Secondary | ICD-10-CM

## 2015-12-20 DIAGNOSIS — L0390 Cellulitis, unspecified: Secondary | ICD-10-CM | POA: Insufficient documentation

## 2015-12-20 LAB — COMPREHENSIVE METABOLIC PANEL
ALBUMIN: 3.8 g/dL (ref 3.6–5.1)
ALT: 16 U/L (ref 6–29)
AST: 28 U/L (ref 10–35)
Alkaline Phosphatase: 85 U/L (ref 33–130)
BUN: 45 mg/dL — ABNORMAL HIGH (ref 7–25)
CALCIUM: 9.6 mg/dL (ref 8.6–10.4)
CHLORIDE: 98 mmol/L (ref 98–110)
CO2: 28 mmol/L (ref 20–31)
Creat: 1.47 mg/dL — ABNORMAL HIGH (ref 0.60–0.88)
Glucose, Bld: 142 mg/dL — ABNORMAL HIGH (ref 65–99)
POTASSIUM: 5.3 mmol/L (ref 3.5–5.3)
Sodium: 134 mmol/L — ABNORMAL LOW (ref 135–146)
TOTAL PROTEIN: 7.4 g/dL (ref 6.1–8.1)
Total Bilirubin: 0.7 mg/dL (ref 0.2–1.2)

## 2015-12-20 MED ORDER — FUROSEMIDE 40 MG PO TABS: ORAL_TABLET | ORAL | 1 refills | Status: DC

## 2015-12-20 MED ORDER — CEPHALEXIN 500 MG PO CAPS: 500.0000 mg | ORAL_CAPSULE | Freq: Four times a day (QID) | ORAL | 0 refills | Status: DC

## 2015-12-20 NOTE — Progress Notes (Addendum)
Facility  [facility]    Place of Service:   OFFICE    No Known Allergies  Chief Complaint  Patient presents with  . Follow-up    Low energy  . Follow-up    Psychiatrist  . Follow-up    Cecille Aver - son    HPI:  Patient had low BP a few days ago. Losartan has been on hold because of that. Her appetite has not been good.She has not run fever. No vomiting or diarrhea.  Has a rash on the left leg that started yesterday. Mild discomfort in the left calf.   Prior problem of discomfort in the LLQ abd is resolved.  She has surgery scheduled on the rightt knee on 01/02/16.   Medications: Patient's Medications  New Prescriptions   No medications on file  Previous Medications   ASPIRIN EC 81 MG TABLET    Take 81 mg by mouth daily.   BAYER MICROLET LANCETS LANCETS    Use as instructed to check blood sugar once daily.250.00   FERROUS SULFATE 325 (65 FE) MG TABLET    Take 325 mg by mouth daily.    FUROSEMIDE (LASIX) 40 MG TABLET    Take one tablet by mouth once daily for edema   GLUCOSE BLOOD (BAYER CONTOUR TEST) TEST STRIP    Use as instructed to check blood sugar once daily. 250.00   LEVOTHYROXINE (SYNTHROID, LEVOTHROID) 25 MCG TABLET    Take one tablet by mouth once daily for thyroid supplement   LOSARTAN (COZAAR) 50 MG TABLET    Take one tablet by mouth once daily to control blood pressure   MULTIPLE VITAMIN (MULTIVITAMIN WITH MINERALS) TABS TABLET    Take 1 tablet by mouth daily. Centrum  Modified Medications   No medications on file  Discontinued Medications   MICONAZOLE (MICOTIN) 200 MG VAGINAL SUPPOSITORY    Use one suppository in vagina nightly for 3 nights to treat yeast    Review of Systems  Constitutional: Negative for activity change, appetite change, chills, fatigue, fever and unexpected weight change.  HENT: Negative.   Eyes: Negative.   Respiratory: Negative.  Negative for cough and shortness of breath.   Cardiovascular: Positive for leg swelling. Negative for chest  pain and palpitations.  Gastrointestinal: Positive for abdominal pain (left sided) and constipation. Negative for abdominal distention, diarrhea and vomiting.       Incontinent of stool and urine sometimes  Endocrine: Negative for cold intolerance, heat intolerance, polydipsia, polyphagia and polyuria.  Genitourinary:       Incontinent. Vaginal itching and perirectal itching without discharge. No blood in the stool. No fever.  Musculoskeletal: Positive for arthralgias (Right knee and right shoulder), back pain, gait problem, joint swelling and myalgias.       Right shoulder pain. Right knee pain and weakness.  Skin: Negative.   Neurological: Positive for weakness (Left leg) and numbness (Left foot).       Left foot drop.  Hematological: Negative.   Psychiatric/Behavioral: Positive for decreased concentration.    Vitals:   12/20/15 0917  BP: 110/80  Pulse: 86  Temp: 97.6 F (36.4 C)  TempSrc: Oral  SpO2: 98%  Weight: 137 lb 3.2 oz (62.2 kg)  Height: _0  (1.473 m)   Body mass index is 28.67 kg/m. Wt Readings from Last 3 Encounters:  12/20/15 137 lb 3.2 oz (62.2 kg)  12/06/15 134 lb (60.8 kg)  11/14/15 139 lb (63 kg)      Physical Exam  Constitutional: She is oriented to person, place, and time. She appears well-developed and well-nourished. No distress.  frail  HENT:  Head: Normocephalic and atraumatic.  Left Ear: External ear normal.  Nose: Nose normal.  Eyes: Conjunctivae and EOM are normal. Pupils are equal, round, and reactive to light.  Neck: Normal range of motion. Neck supple. No JVD present. No tracheal deviation present. No thyromegaly present.  Cardiovascular: Normal rate, regular rhythm, normal heart sounds and intact distal pulses.  Exam reveals no gallop and no friction rub.   No murmur heard. Varicose veins.  Pulmonary/Chest: Effort normal and breath sounds normal. No respiratory distress. She has no wheezes. She has no rales.  Abdominal: Soft. Bowel  sounds are normal. She exhibits no distension and no mass. There is no tenderness.  Musculoskeletal: She exhibits edema (1+ right and 2+ edema) and tenderness (both knees).  Poor balance. Using walker. Pain at the right shoulder and some crepitance noted with rotational movements. Right knee pain. Left knee status post TKR. Left foot drop. Significant weakness with attempts at dorsiflexion.  Lymphadenopathy:    She has no cervical adenopathy.  Neurological: She is alert and oriented to person, place, and time. No cranial nerve deficit. Coordination normal.  Decreased sensation left foot. Left foot drop and weakness with dorsiflexion.  Skin: No rash noted. No erythema. No pallor.  Seborrheic keratoses. Healed left TKR scar. Mild erythema of the left leg. No palpable venous cord.  Psychiatric: She has a normal mood and affect. Her behavior is normal. Thought content normal.    Labs reviewed: Lab Summary Latest Ref Rng & Units 10/23/2015  Hemoglobin 12.0 - 15.0 g/dL 11.2(L)  Hematocrit 36.0 - 46.0 % 36.4  White count 4.0 - 10.5 K/uL 6.4  Platelet count 150 - 400 K/uL 176  Sodium 135 - 145 mmol/L 136  Potassium 3.5 - 5.1 mmol/L 4.2  Calcium 8.9 - 10.3 mg/dL 9.2  Phosphorus - (None)  Creatinine 0.44 - 1.00 mg/dL 1.12(H)  AST 15 - 41 U/L 26  Alk Phos 38 - 126 U/L 75  Bilirubin 0.3 - 1.2 mg/dL 0.4  Glucose 65 - 99 mg/dL 115(H)  Cholesterol - (None)  HDL cholesterol - (None)  Triglycerides - (None)  LDL Direct - (None)  LDL Calc - (None)  Total protein 6.5 - 8.1 g/dL 6.7  Albumin 3.5 - 5.0 g/dL 3.7  Some recent data might be hidden   Lab Results  Component Value Date   TSH 1.030 07/21/2015   TSH 0.755 12/02/2014   TSH 1.040 05/05/2014   Lab Results  Component Value Date   BUN 38 (H) 10/23/2015   BUN 30 07/21/2015   BUN 24 (A) 06/05/2015   Lab Results  Component Value Date   HGBA1C 6.0 (H) 07/21/2015   HGBA1C 6.1 (H) 05/19/2015   HGBA1C 6.1 (H) 12/02/2014     Assessment/Plan  1. Edema, unspecified type - furosemide (LASIX) 40 MG tablet; Take one tablet by mouth once daily for edema  Dispense: 90 tablet; Refill: 1  2. Essential hypertension Stop losartan - Comprehensive metabolic panel  3. Anemia, unspecified type - CBC With Differential  4. Primary osteoarthritis of both knees Has surgery scheduled  5. Controlled type 2 DM with peripheral circulatory disorder (HCC) - Hemoglobin A1c - Comprehensive metabolic panel  6. Venous insufficiency (chronic) (peripheral) Contribute to the edema  12/20/15 5:10PM Patient returned to office for recheck of the left leg. It is now warm, slightly tender, ans a little more edematous  since this morning. I suspect cellulitis.  I started Cephalexin 500 mg tid for 7 days and ordered Venous US of the left leg.

## 2015-12-20 NOTE — Patient Instructions (Addendum)
Stop the losartan. BP goal is <140/ <90. Problem values in systolic BP (upper value) would be if sustained <80 or >180. Problem values in diastolic BP (lower value) would be if sustained <40 or >120.

## 2015-12-20 NOTE — Addendum Note (Signed)
Addended by: Kimber RelicGREEN, Derika Eckles G on: 12/20/2015 05:21 PM   Modules accepted: Orders

## 2015-12-21 ENCOUNTER — Inpatient Hospital Stay (HOSPITAL_COMMUNITY): Admission: RE | Admit: 2015-12-21 | Payer: BC Managed Care – PPO | Source: Ambulatory Visit

## 2015-12-21 ENCOUNTER — Ambulatory Visit: Payer: Medicare Other | Admitting: Occupational Therapy

## 2015-12-21 LAB — HEMOGLOBIN A1C
Hgb A1c MFr Bld: 5.9 % — ABNORMAL HIGH (ref <5.7)
MEAN PLASMA GLUCOSE: 123 mg/dL

## 2015-12-22 ENCOUNTER — Other Ambulatory Visit (HOSPITAL_COMMUNITY): Payer: BC Managed Care – PPO

## 2015-12-25 ENCOUNTER — Ambulatory Visit (INDEPENDENT_AMBULATORY_CARE_PROVIDER_SITE_OTHER): Payer: Medicare Other | Admitting: Nurse Practitioner

## 2015-12-25 ENCOUNTER — Encounter: Payer: Self-pay | Admitting: Nurse Practitioner

## 2015-12-25 ENCOUNTER — Ambulatory Visit (HOSPITAL_COMMUNITY)
Admission: RE | Admit: 2015-12-25 | Discharge: 2015-12-25 | Disposition: A | Discharge status: Home / Self Care | Payer: Medicare Other | Source: Ambulatory Visit | Attending: Nurse Practitioner | Admitting: Nurse Practitioner

## 2015-12-25 ENCOUNTER — Other Ambulatory Visit: Payer: Self-pay | Admitting: Nurse Practitioner

## 2015-12-25 VITALS — BP 132/78 | HR 83 | Temp 97.6°F | Resp 17 | Ht <= 58 in | Wt 138.4 lb

## 2015-12-25 DIAGNOSIS — M79662 Pain in left lower leg: Secondary | ICD-10-CM

## 2015-12-25 DIAGNOSIS — M7989 Other specified soft tissue disorders: Secondary | ICD-10-CM

## 2015-12-25 DIAGNOSIS — L03116 Cellulitis of left lower limb: Secondary | ICD-10-CM

## 2015-12-25 MED ORDER — DOXYCYCLINE HYCLATE 100 MG PO TABS: 100.0000 mg | ORAL_TABLET | Freq: Two times a day (BID) | ORAL | 0 refills | Status: DC

## 2015-12-25 NOTE — Patient Instructions (Addendum)
Will send to [facility] to rule on blood clot in left leg.  To cont keflex as prescribed Cont elevation and lasix

## 2015-12-25 NOTE — Progress Notes (Signed)
VASCULAR LAB PRELIMINARY  PRELIMINARY  PRELIMINARY  PRELIMINARY  Left lower extremity venous duplex completed.    Preliminary report:  There is no DVT or SVT noted in the left lower extremity.  There are multiple enlarged lymph nodes noted in the left thigh and inguinal areas.   Jessica Hawkins, RVT 12/25/2015, 10:44 AM

## 2015-12-25 NOTE — Progress Notes (Signed)
Spoke with vascular tech, no DVT but swollen lymph nodes. Will dc keflex and start Doxycyline 100 mg twice daily. To follow up on Thursday 10/26

## 2015-12-25 NOTE — Progress Notes (Signed)
Careteam: Patient Care Team: Kimber RelicArthur G Green, MD as PCP - General (Internal Medicine) Vida RiggerMarc Magod, MD as Consulting Physician (Gastroenterology) Marcine MatarStephen Dahlstedt, MD as Consulting Physician (Urology) Dominica SeverinWilliam Gramig, MD as Consulting Physician (Orthopedic Surgery) Janalyn HarderStuart Tafeen, MD as Consulting Physician (Dermatology) Teryl LucyJoshua Landau, MD as Consulting Physician (Orthopedic Surgery)  Advanced Directive information Does patient have an advance directive?: Yes, Type of Advance Directive: Healthcare Power of Attorney  No Known Allergies  Chief Complaint  Patient presents with  . Acute Visit    Left leg swelling x 1 week. Pt saw Dr. Chilton SiGreen on 12/20/15 where he ordered Kflex 500 mg QID     HPI: Patient is a 82 y.o. female seen in the office today to follow up edema. Pt was seen on 10/18 and noted to have rash and edema to left lower leg. Pt was treated with keflex QID for 7 days. Pt reports she is active at home. Does her housework routinely but the leg pain and swelling has really slowed her down.  Takes lasix routinely. Leg is not getting any better. Elevating leg. Yesterday was very hot and painful.  No fevers or chills.  Review of Systems:  Review of Systems  Constitutional: Positive for activity change. Negative for appetite change, chills, fatigue, fever and unexpected weight change.  Respiratory: Negative for cough, shortness of breath and wheezing.   Cardiovascular: Positive for leg swelling. Negative for chest pain and palpitations.  Gastrointestinal: Positive for abdominal pain (left sided) and constipation. Negative for abdominal distention, diarrhea and vomiting.       Incontinent of stool and urine sometimes  Genitourinary:       Incontinent. Vaginal itching and perirectal itching without discharge. No blood in the stool. No fever.  Musculoskeletal: Positive for arthralgias (Right knee and right shoulder), back pain, gait problem, joint swelling and myalgias.       Right  shoulder pain. Right knee pain and weakness.  Skin: Negative.   Neurological: Positive for weakness (Left leg) and numbness (Left foot).       Left foot drop.    Past Medical History:  Diagnosis Date  . Acute blood loss anemia   . Anginal pain [facility](HCC)    'i haven't had them in a long time but i used to'  . Arthritis    Osteoarthritis  . BBB (bundle branch block)    RT  . Coronary atherosclerosis of unspecified type of vessel, native or graft   . Debility, unspecified   . Diabetes mellitus without complication (HCC)    Diet controlled  . Diaphragmatic hernia without mention of obstruction or gangrene   . Disturbance of skin sensation   . Edema   . Fibrocystic breast   . Gait disorder   . GERD (gastroesophageal reflux disease) 06/29/2011  . Hiatal hernia   . History of transfusion of packed red blood cells   . Hyperlipidemia   . Hypertension 06/29/11  . Hypothyroid 06/29/2011  . Insomnia   . OAB (overactive bladder)   . Osteoporosis   . Platelet disorder (HCC)    ? Von Willebrand; abnormal PLT function assay 07/04/11   . Unspecified constipation   . Unspecified sleep apnea   . Unspecified urinary incontinence   . Unspecified vitamin D deficiency   . Unsteady gait   . Vertigo    Past Surgical History:  Procedure Laterality Date  . EYE SURGERY Right 2005   Cataract surgery  . EYE SURGERY Left 2006   Cataract surgery  .  FEMUR IM NAIL  06/29/2011   Procedure: INTRAMEDULLARY (IM) NAIL FEMORAL;  Surgeon: Verlee Rossetti, MD;  Location: [facility] OR;  Service: Orthopedics;  Laterality: Left;  . HIP SURGERY Right 2002   ORIF  . ROTATOR CUFF REPAIR Left 1999   Tear  . THYROIDECTOMY  1952  . TONSILLECTOMY    . TOTAL KNEE ARTHROPLASTY Left 05/29/2015   Procedure: TOTAL KNEE ARTHROPLASTY;  Surgeon: Dannielle Huh, MD;  Location: [facility] OR;  Service: Orthopedics;  Laterality: Left;   Social History:   reports that she has never smoked. She has never used smokeless tobacco. She reports that she  does not drink alcohol or use drugs.  Family History  Problem Relation Age of Onset  . Cancer Mother     Stomach  . Cancer Father     Prostate  . Emphysema Father   . Alcohol abuse Sister   . Liver disease Sister   . Kidney disease Brother   . Hypertension Daughter   . Cancer Brother     Prostate, Bladder  . Emphysema Brother   . Cancer Brother     Prostate  . Arthritis Daughter     Knees  . Heart murmur Son   . Mental illness Son     Autism    Medications: Patient's Medications  New Prescriptions   No medications on file  Previous Medications   ASPIRIN EC 81 MG TABLET    Take 81 mg by mouth daily.   BAYER MICROLET LANCETS LANCETS    Use as instructed to check blood sugar once daily.250.00   CEPHALEXIN (KEFLEX) 500 MG CAPSULE    Take 1 capsule (500 mg total) by mouth 4 (four) times daily.   FERROUS SULFATE 325 (65 FE) MG TABLET    Take 325 mg by mouth daily.    FUROSEMIDE (LASIX) 40 MG TABLET    Take one tablet by mouth once daily for edema   GLUCOSE BLOOD (BAYER CONTOUR TEST) TEST STRIP    Use as instructed to check blood sugar once daily. 250.00   LEVOTHYROXINE (SYNTHROID, LEVOTHROID) 25 MCG TABLET    Take one tablet by mouth once daily for thyroid supplement   LOSARTAN (COZAAR) 50 MG TABLET    Take one tablet by mouth once daily to control blood pressure   MULTIPLE VITAMIN (MULTIVITAMIN WITH MINERALS) TABS TABLET    Take 1 tablet by mouth daily. Centrum  Modified Medications   No medications on file  Discontinued Medications   No medications on file     Physical Exam:  Vitals:   12/25/15 0842  BP: 132/78  Pulse: 83  Resp: 17  Temp: 97.6 F (36.4 C)  TempSrc: Oral  SpO2: 96%  Weight: 138 lb 6.4 oz (62.8 kg)  Height: 4\' 10"  (1.473 m)   Body mass index is 28.93 kg/m.  Physical Exam  Constitutional: She is oriented to person, place, and time. She appears well-developed and well-nourished. No distress.  frail  HENT:  Head: Normocephalic and atraumatic.   Eyes: Conjunctivae and EOM are normal. Pupils are equal, round, and reactive to light.  Neck: Normal range of motion. Neck supple. No JVD present.  Cardiovascular: Normal rate, regular rhythm, normal heart sounds and intact distal pulses.   Varicose veins.  Pulmonary/Chest: Effort normal and breath sounds normal. No respiratory distress. She has no wheezes. She has no rales.  Abdominal: Soft. Bowel sounds are normal.  Musculoskeletal: She exhibits edema (1+ right and 3+ to left) and tenderness (to  left leg and calf).   Left foot drop. Significant weakness with attempts at dorsiflexion. Negative homans sign   Neurological: She is alert and oriented to person, place, and time. Coordination normal.  Decreased sensation left foot. Left foot drop and weakness with dorsiflexion.  Skin:  erythema of the left leg.   Labs reviewed: Basic Metabolic Panel:  Recent Labs  16/10/96 0913 10/23/15 0245 12/20/15 1018  NA 140 136 134*  K 4.7 4.2 5.3  CL 101 102 98  CO2 24 26 28   GLUCOSE 109* 115* 142*  BUN 30 38* 45*  CREATININE 0.91 1.12* 1.47*  CALCIUM 9.2 9.2 9.6  TSH 1.030  --   --    Liver Function Tests:  Recent Labs  07/21/15 0913 10/23/15 0408 12/20/15 1018  AST 16 26 28   ALT 8 14 16   ALKPHOS 89 75 85  BILITOT 0.3 0.4 0.7  PROT 7.2 6.7 7.4  ALBUMIN 3.8 3.7 3.8    Recent Labs  10/23/15 0408  LIPASE 25   No results for input(s): AMMONIA in the last 8760 hours. CBC:  Recent Labs  05/19/15 1555 05/22/15 1559  05/31/15 0415  06/01/15 0338 06/05/15 10/23/15 0245  WBC 4.8 4.4  < > 8.8  < > 6.7 5.3 6.4  NEUTROABS 2.2 2.4  --   --   --   --  3  --   HGB 11.7* 12.0  < > 9.7*  --  8.4* 7.8* 11.2*  HCT 37.5 36.8  < > 29.5*  --  26.4* 25* 36.4  MCV 84.5 83.6  < > 83.1  --  82.8  --  85.4  PLT 183 193  < > 134*  --  136* 238 176  < > = values in this interval not displayed. Lipid Panel: No results for input(s): CHOL, HDL, LDLCALC, TRIG, CHOLHDL, LDLDIRECT in the last  8760 hours. TSH:  Recent Labs  07/21/15 0913  TSH 1.030   A1C: Lab Results  Component Value Date   HGBA1C 5.9 (H) 12/20/2015     Assessment/Plan 1. Pain and swelling of left lower leg -to cont on keflex QID for 7 days for treatment of cellulitis, cont with lasix and leg elevation - VAS [place] LOWER EXTREMITY VENOUS (DVT) schedule at 10 am to rule out DVT, pt to go directly from office to [facility].  Janene Harvey. Biagio Borg  [facility] [phone] 8 am - 5 pm) [phone] (after hours)

## 2015-12-26 ENCOUNTER — Ambulatory Visit: Payer: Medicare Other | Admitting: Occupational Therapy

## 2015-12-26 ENCOUNTER — Other Ambulatory Visit (HOSPITAL_COMMUNITY): Payer: BC Managed Care – PPO

## 2015-12-28 ENCOUNTER — Ambulatory Visit (INDEPENDENT_AMBULATORY_CARE_PROVIDER_SITE_OTHER): Payer: Medicare Other | Admitting: Nurse Practitioner

## 2015-12-28 ENCOUNTER — Other Ambulatory Visit: Payer: Self-pay | Admitting: Nurse Practitioner

## 2015-12-28 ENCOUNTER — Encounter: Payer: Self-pay | Admitting: Nurse Practitioner

## 2015-12-28 VITALS — BP 100/70 | HR 76 | Temp 97.6°F | Ht <= 58 in | Wt 138.0 lb

## 2015-12-28 DIAGNOSIS — E1151 Type 2 diabetes mellitus with diabetic peripheral angiopathy without gangrene: Secondary | ICD-10-CM | POA: Diagnosis not present

## 2015-12-28 DIAGNOSIS — L03116 Cellulitis of left lower limb: Secondary | ICD-10-CM

## 2015-12-28 DIAGNOSIS — I10 Essential (primary) hypertension: Secondary | ICD-10-CM | POA: Diagnosis not present

## 2015-12-28 NOTE — Patient Instructions (Addendum)
   Okay to continue icy/hot, bengay or biofreeze to leg as needed  Cont to doxycyline 100 mg by mouth twice daily until complete-- can take with or without food. If it upsets your stomach take with food  Tylenol 325 mg tablet 2 tablets to equal 650 mg by mouth every 6 hours as needed for pain  florastor (probiotic) and take twice daily while on antibiotic  May also eat Yogurt   Stop losartan- it has been removed off medication list  Keep follow up appt with Dr Chilton SiGreen   Lymph vessels route lymph fluid through nodes throughout the body. Lymph nodes are small structures that work as filters for harmful substances. They contain immune cells that can help fight infection by attacking and destroying germs that are carried in through the lymph fluid.   Cellulitis Cellulitis is an infection of the skin and the tissue beneath it. The infected area is usually red and tender. Cellulitis occurs most often in the arms and lower legs.  CAUSES  Cellulitis is caused by bacteria that enter the skin through cracks or cuts in the skin. The most common types of bacteria that cause cellulitis are staphylococci and streptococci. SIGNS AND SYMPTOMS   Redness and warmth.  Swelling.  Tenderness or pain.  Fever. DIAGNOSIS  Your health care provider can usually determine what is wrong based on a physical exam. Blood tests may also be done. TREATMENT  Treatment usually involves taking an antibiotic medicine. HOME CARE INSTRUCTIONS   Take your antibiotic medicine as directed by your health care provider. Finish the antibiotic even if you start to feel better.  Keep the infected arm or leg elevated to reduce swelling.  Apply a warm cloth to the affected area up to 4 times per day to relieve pain.  Take medicines only as directed by your health care provider.  Keep all follow-up visits as directed by your health care provider. SEEK MEDICAL CARE IF:   You notice red streaks coming from the infected  area.  Your red area gets larger or turns dark in color.  Your bone or joint underneath the infected area becomes painful after the skin has healed.  Your infection returns in the same area or another area.  You notice a swollen bump in the infected area.  You develop new symptoms.  You have a fever. SEEK IMMEDIATE MEDICAL CARE IF:   You feel very sleepy.  You develop vomiting or diarrhea.  You have a general ill feeling (malaise) with muscle aches and pains.   This information is not intended to replace advice given to you by your health care provider. Make sure you discuss any questions you have with your health care provider.   Document Released: 11/28/2004 Document Revised: 11/09/2014 Document Reviewed: 05/06/2011 Elsevier Interactive Patient Education [employer]2016 [employer]

## 2015-12-28 NOTE — Progress Notes (Signed)
Careteam: Patient Care Team: Kimber Relic, MD as PCP - General (Internal Medicine) Vida Rigger, MD as Consulting Physician (Gastroenterology) Marcine Matar, MD as Consulting Physician (Urology) Dominica Severin, MD as Consulting Physician (Orthopedic Surgery) Janalyn Harder, MD as Consulting Physician (Dermatology) Teryl Lucy, MD as Consulting Physician (Orthopedic Surgery)  No Known Allergies  Chief Complaint  Patient presents with  . Acute Visit    Follow-up on left leg swelling, Patient is requesting rx for topical to apply to leg. Here with cargiver Vikki Ports      HPI: Patient is a 82 y.o. female seen in the office today for follow up on left leg swelling. Pt was sent for lower extremity venous doppler 3 days ago which found Left lower extremity venous duplex completed.   Preliminary report:  There is no DVT or SVT noted in the left lower extremity.  There are multiple enlarged lymph nodes noted in the left thigh and inguinal areas.  Keflex was changed to doxycyline which she is taking twice a day Overall pain, swelling and redness has now improved.  Still has some pain in left lower leg. Taking advil which helps some Using bengay with good relief   Have been holding losartan due to low blood pressure. Blood pressure 100/70 today and she has not been on losartan  Blood sugar before lunch was 170, another blood sugar was 190, has diabetes but not currently on medication. Caregivers worried blood sugars are too high  Review of Systems:  Review of Systems  Constitutional: Positive for activity change. Negative for appetite change, chills, fatigue, fever and unexpected weight change.  Respiratory: Negative for cough, shortness of breath and wheezing.   Cardiovascular: Positive for leg swelling. Negative for chest pain and palpitations.  Gastrointestinal: Negative for abdominal distention, abdominal pain, constipation, diarrhea and vomiting.       Incontinent of stool and  urine sometimes  Genitourinary:       Hx of yeast infections  Musculoskeletal: Positive for arthralgias (Right knee and right shoulder), gait problem and myalgias.       Right shoulder pain. Right knee pain and weakness.  Skin: Negative.   Neurological: Positive for weakness (Left leg) and numbness (Left foot).       Left foot drop.    Past Medical History:  Diagnosis Date  . Acute blood loss anemia   . Anginal pain [facility])    'i haven't had them in a long time but i used to'  . Arthritis    Osteoarthritis  . BBB (bundle branch block)    RT  . Coronary atherosclerosis of unspecified type of vessel, native or graft   . Debility, unspecified   . Diabetes mellitus without complication (HCC)    Diet controlled  . Diaphragmatic hernia without mention of obstruction or gangrene   . Disturbance of skin sensation   . Edema   . Fibrocystic breast   . Gait disorder   . GERD (gastroesophageal reflux disease) 06/29/2011  . Hiatal hernia   . History of transfusion of packed red blood cells   . Hyperlipidemia   . Hypertension 06/29/11  . Hypothyroid 06/29/2011  . Insomnia   . OAB (overactive bladder)   . Osteoporosis   . Platelet disorder (HCC)    ? Von Willebrand; abnormal PLT function assay 07/04/11   . Unspecified constipation   . Unspecified sleep apnea   . Unspecified urinary incontinence   . Unspecified vitamin D deficiency   . Unsteady gait   .  Vertigo    Past Surgical History:  Procedure Laterality Date  . EYE SURGERY Right 2005   Cataract surgery  . EYE SURGERY Left 2006   Cataract surgery  . FEMUR IM NAIL  06/29/2011   Procedure: INTRAMEDULLARY (IM) NAIL FEMORAL;  Surgeon: Verlee RossettiSteven R Norris, MD;  [facility];  Service: Orthopedics;  Laterality: Left;  . HIP SURGERY Right 2002   ORIF  . ROTATOR CUFF REPAIR Left 1999   Tear  . THYROIDECTOMY  1952  . TONSILLECTOMY    . TOTAL KNEE ARTHROPLASTY Left 05/29/2015   Procedure: TOTAL KNEE ARTHROPLASTY;  Surgeon: Dannielle HuhSteve Lucey,  MD;  Location: [facility] OR;  Service: Orthopedics;  Laterality: Left;   Social History:   reports that she has never smoked. She has never used smokeless tobacco. She reports that she does not drink alcohol or use drugs.  Family History  Problem Relation Age of Onset  . Cancer Mother     Stomach  . Cancer Father     Prostate  . Emphysema Father   . Alcohol abuse Sister   . Liver disease Sister   . Kidney disease Brother   . Hypertension Daughter   . Cancer Brother     Prostate, Bladder  . Emphysema Brother   . Cancer Brother     Prostate  . Arthritis Daughter     Knees  . Heart murmur Son   . Mental illness Son     Autism    Medications: Patient's Medications  New Prescriptions   No medications on file  Previous Medications   ASPIRIN EC 81 MG TABLET    Take 81 mg by mouth daily.   BAYER MICROLET LANCETS LANCETS    Use as instructed to check blood sugar once daily.250.00   DOXYCYCLINE (VIBRA-TABS) 100 MG TABLET    Take 1 tablet (100 mg total) by mouth 2 (two) times daily.   FERROUS SULFATE 325 (65 FE) MG TABLET    Take 325 mg by mouth daily.    FUROSEMIDE (LASIX) 40 MG TABLET    Take one tablet by mouth once daily for edema   GLUCOSE BLOOD (BAYER CONTOUR TEST) TEST STRIP    Use as instructed to check blood sugar once daily. 250.00   LEVOTHYROXINE (SYNTHROID, LEVOTHROID) 25 MCG TABLET    Take one tablet by mouth once daily for thyroid supplement   LOSARTAN (COZAAR) 50 MG TABLET    Take one tablet by mouth once daily to control blood pressure   MULTIPLE VITAMIN (MULTIVITAMIN WITH MINERALS) TABS TABLET    Take 1 tablet by mouth daily. Centrum  Modified Medications   No medications on file  Discontinued Medications   CEPHALEXIN (KEFLEX) 500 MG CAPSULE    Take 1 capsule (500 mg total) by mouth 4 (four) times daily.     Physical Exam:  Vitals:   12/28/15 0824  BP: 100/70  Pulse: 76  Temp: 97.6 F (36.4 C)  TempSrc: Oral  SpO2: 98%  Weight: 138 lb (62.6 kg)  Height: 4'  10" (1.473 m)   Body mass index is 28.84 kg/m.  Physical Exam  Constitutional: She is oriented to person, place, and time. She appears well-developed and well-nourished. No distress.  frail  HENT:  Head: Normocephalic and atraumatic.  Eyes: Conjunctivae and EOM are normal. Pupils are equal, round, and reactive to light.  Neck: Normal range of motion. Neck supple. No JVD present.  Cardiovascular: Normal rate, regular rhythm, normal heart sounds and intact distal pulses.  Varicose veins.  Pulmonary/Chest: Effort normal and breath sounds normal. No respiratory distress. She has no wheezes. She has no rales.  Abdominal: Soft. Bowel sounds are normal.  Musculoskeletal: She exhibits edema (1+ right and 2+ to left) and tenderness (to left leg and calf).   Left foot drop. Significant weakness with attempts at dorsiflexion. Negative homans sign   Lymphadenopathy:       Left: Inguinal adenopathy present.  Neurological: She is alert and oriented to person, place, and time. Coordination normal.  Decreased sensation left foot. Left foot drop and weakness with dorsiflexion.  Skin:  erythema of the left leg.    Labs reviewed: Basic Metabolic Panel:  Recent Labs  69/62/95 0913 10/23/15 0245 12/20/15 1018  NA 140 136 134*  K 4.7 4.2 5.3  CL 101 102 98  CO2 24 26 28   GLUCOSE 109* 115* 142*  BUN 30 38* 45*  CREATININE 0.91 1.12* 1.47*  CALCIUM 9.2 9.2 9.6  TSH 1.030  --   --    Liver Function Tests:  Recent Labs  07/21/15 0913 10/23/15 0408 12/20/15 1018  AST 16 26 28   ALT 8 14 16   ALKPHOS 89 75 85  BILITOT 0.3 0.4 0.7  PROT 7.2 6.7 7.4  ALBUMIN 3.8 3.7 3.8    Recent Labs  10/23/15 0408  LIPASE 25   No results for input(s): AMMONIA in the last 8760 hours. CBC:  Recent Labs  05/19/15 1555 05/22/15 1559  05/31/15 0415  06/01/15 0338 06/05/15 10/23/15 0245  WBC 4.8 4.4  < > 8.8  < > 6.7 5.3 6.4  NEUTROABS 2.2 2.4  --   --   --   --  3  --   HGB 11.7* 12.0  < >  9.7*  --  8.4* 7.8* 11.2*  HCT 37.5 36.8  < > 29.5*  --  26.4* 25* 36.4  MCV 84.5 83.6  < > 83.1  --  82.8  --  85.4  PLT 183 193  < > 134*  --  136* 238 176  < > = values in this interval not displayed. Lipid Panel: No results for input(s): CHOL, HDL, LDLCALC, TRIG, CHOLHDL, LDLDIRECT in the last 8760 hours. TSH:  Recent Labs  07/21/15 0913  TSH 1.030   A1C: Lab Results  Component Value Date   HGBA1C 5.9 (H) 12/20/2015     Assessment/Plan 1. Cellulitis of left lower extremity Improved on doxycyline, to cont full course of antibiotics Okay to continue icy/hot, bengay or biofreeze to leg as needed Tylenol 325 mg tablet 2 tablets to equal 650 mg by mouth every 6 hours as needed for pain florastor (probiotic) and take twice daily while on antibiotic May also eat Yogurt   2. Essential hypertension, benign To stop losartan, blood pressure stable off medication for now.   3. Controlled type 2 DM with peripheral circulatory disorder (HCC) Well controlled, A1c of 5.9 on labs done in oct 2017  To keep follow up with Dr Chilton Si, to notify if swelling, pain, redness worsens  Ravin Bendall K. Biagio Borg  [facility] [phone] 8 am - 5 pm) [phone] (after hours)

## 2015-12-29 ENCOUNTER — Other Ambulatory Visit: Payer: Self-pay | Admitting: *Deleted

## 2015-12-29 ENCOUNTER — Ambulatory Visit: Payer: Medicare Other | Admitting: Occupational Therapy

## 2015-12-29 DIAGNOSIS — L03116 Cellulitis of left lower limb: Secondary | ICD-10-CM

## 2015-12-29 MED ORDER — LEVOTHYROXINE SODIUM 25 MCG PO TABS: ORAL_TABLET | ORAL | 1 refills | Status: DC

## 2015-12-29 MED ORDER — DOXYCYCLINE HYCLATE 100 MG PO TABS: 100.0000 mg | ORAL_TABLET | Freq: Two times a day (BID) | ORAL | 0 refills | Status: DC

## 2015-12-29 NOTE — Telephone Encounter (Signed)
Patient daughter, Flora Lippsamela Banks called and stated that patient lost her Rx for antibiotic and needs one faxed to pharmacy. Also needs RF on her Levothyroxine. Faxed.

## 2015-12-30 ENCOUNTER — Telehealth: Payer: Self-pay | Admitting: Internal Medicine

## 2015-12-30 DIAGNOSIS — R609 Edema, unspecified: Secondary | ICD-10-CM

## 2015-12-30 NOTE — Telephone Encounter (Signed)
S/w daughter, Rut, as well as HH caregiver several times today regarding abx pt is taking for cellulitis and pt's LE swelling. Caregiver reports redness improved in LLE but she continues to have b/l LE swelling even with elevation of BLE above heart. She takes lasix 40mg  daily and is making urine. She is staying hydrated. Instructed caregiver to increase lasix 60mg  daily x 2 days and if no better, she will need to go to the ED for eval. she will continue daily banana for K+ supplementation. Reviewed chart and pt recently had LLE doppler that was neg DVT. Cr 1.47 on Oct 18th. Pt called back and stated she cannot locate her lasix bottle and needs Rx sent to pharmacy [facility] [address])  **NOTE: unable to send lasix to above pharmacy at location provided as it is not found in system. Contacted pt at home to get different pharmacy and was told by aide, Delice Bisonara, that pt has enough medication and to cancel  Rx.

## 2015-12-31 ENCOUNTER — Encounter (HOSPITAL_COMMUNITY): Payer: Self-pay

## 2015-12-31 ENCOUNTER — Emergency Department (HOSPITAL_COMMUNITY)
Admission: EM | Admit: 2015-12-31 | Discharge: 2016-01-01 | Disposition: A | Discharge status: Home / Self Care | Payer: Medicare Other | Attending: Emergency Medicine | Admitting: Emergency Medicine

## 2015-12-31 DIAGNOSIS — R2242 Localized swelling, mass and lump, left lower limb: Secondary | ICD-10-CM | POA: Insufficient documentation

## 2015-12-31 DIAGNOSIS — Z96652 Presence of left artificial knee joint: Secondary | ICD-10-CM | POA: Diagnosis not present

## 2015-12-31 DIAGNOSIS — E039 Hypothyroidism, unspecified: Secondary | ICD-10-CM | POA: Diagnosis not present

## 2015-12-31 DIAGNOSIS — R600 Localized edema: Secondary | ICD-10-CM | POA: Diagnosis not present

## 2015-12-31 DIAGNOSIS — Z7982 Long term (current) use of aspirin: Secondary | ICD-10-CM | POA: Insufficient documentation

## 2015-12-31 DIAGNOSIS — Z79899 Other long term (current) drug therapy: Secondary | ICD-10-CM | POA: Diagnosis not present

## 2015-12-31 DIAGNOSIS — E1159 Type 2 diabetes mellitus with other circulatory complications: Secondary | ICD-10-CM | POA: Diagnosis not present

## 2015-12-31 DIAGNOSIS — I10 Essential (primary) hypertension: Secondary | ICD-10-CM | POA: Diagnosis not present

## 2015-12-31 DIAGNOSIS — L03116 Cellulitis of left lower limb: Secondary | ICD-10-CM

## 2015-12-31 NOTE — ED Triage Notes (Signed)
Pt started on antibiotics 12-20-15 for cellulitis on left LE.  Antibiotic was changed to Doxycycline on 12-25-15.  Swelling has increased and now into ankle and foot.  Redness in leg has decreased.  Pain has increased.  Lasix was increased yesterday from 40 mg to 60 mg.

## 2015-12-31 NOTE — ED Provider Notes (Signed)
[facility]-EMERGENCY DEPT Provider Note   CSN: [id number] Arrival date & time: 12/31/15  2145  By signing my name below, I, Nelwyn SalisburyJoshua Fowler, attest that this documentation has been prepared under the direction and in the presence of Shon Batonourtney F Horton, MD . Electronically Signed: Nelwyn SalisburyJoshua Fowler, Scribe. 12/31/2015. 11:19 PM.  History   Chief Complaint Chief Complaint  Patient presents with  . Cellulitis   The history is provided by the patient. No language interpreter was used.    HPI Comments:  Jessica Hawkins is a 82 y.o. female with PMHx of DM who presents to the Emergency Department complaining of recurring  left leg swelling beginning 2 weeks ago. Her lasix medication has recently been increased from 40 to 60 to bring down her leg swelling, with no relief.  Patient was initially seen on 10/16. Started on Keflex. She was then transitioned on 10/23 2 doxycycline. Reports improvement of her leg redness but the swelling has fallen into her ankle. She has had an ultrasound to rule out DVT on 10/23.  Daughter reports that they called the office 24 hours ago. Lasix was increased at that time. Instructed if swelling did not improve they should be evaluated in the ER. This is their concern. Pt typically wears compression stockings for her baseline leg swelling but has not worn them since the onset of her symptoms. She denies any recent fever or CP.   Past Medical History:  Diagnosis Date  . Acute blood loss anemia   . Anginal pain [facility](HCC)    'i haven't had them in a long time but i used to'  . Arthritis    Osteoarthritis  . BBB (bundle branch block)    RT  . Coronary atherosclerosis of unspecified type of vessel, native or graft   . Debility, unspecified   . Diabetes mellitus without complication (HCC)    Diet controlled  . Diaphragmatic hernia without mention of obstruction or gangrene   . Disturbance of skin sensation   . Edema   . Fibrocystic breast   . Gait disorder   . GERD  (gastroesophageal reflux disease) 06/29/2011  . Hiatal hernia   . History of transfusion of packed red blood cells   . Hyperlipidemia   . Hypertension 06/29/11  . Hypothyroid 06/29/2011  . Insomnia   . OAB (overactive bladder)   . Osteoporosis   . Platelet disorder (HCC)    ? Von Willebrand; abnormal PLT function assay 07/04/11   . Unspecified constipation   . Unspecified sleep apnea   . Unspecified urinary incontinence   . Unspecified vitamin D deficiency   . Unsteady gait   . Vertigo     Patient Active Problem List   Diagnosis Date Noted  . Anemia 12/20/2015  . Cellulitis 12/20/2015  . Cellulitis and abscess of leg 12/20/2015  . LLQ abdominal pain 12/06/2015  . Self-care deficit in patient living alone 11/08/2015  . Peripheral nerve contusion 08/15/2015  . Foot drop, left 07/18/2015  . S/P total knee arthroplasty 05/29/2015  . Bleeding disorder (HCC) 05/22/2015  . Primary osteoarthritis of both knees 05/04/2015  . Nonspecific vaginitis 04/11/2015  . Recurrent UTI 03/07/2015  . Joint mouse 03/07/2015  . Right shoulder pain 01/18/2015  . Pre-operative cardiovascular examination 12/07/2014  . Loss of weight 10/13/2013  . Constipation 10/06/2013  . Venous insufficiency (chronic) (peripheral) 02/02/2013  . Knee pain 12/16/2012  . Arthritis of knee, degenerative 10/29/2012  . Edema 07/23/2012  . Abnormality of gait 07/23/2012  . Urinary  incontinence 07/23/2012  . Hyperlipidemia 07/23/2012  . Controlled type 2 DM with peripheral circulatory disorder (HCC) 07/23/2012  . GERD (gastroesophageal reflux disease) 06/29/2011  . HTN (hypertension) 06/29/2011  . Intertrochanteric fracture of left femur (HCC) 06/29/2011  . Arthritis 06/29/2011  . Hypothyroid 06/29/2011    Past Surgical History:  Procedure Laterality Date  . EYE SURGERY Right 2005   Cataract surgery  . EYE SURGERY Left 2006   Cataract surgery  . FEMUR IM NAIL  06/29/2011   Procedure: INTRAMEDULLARY (IM) NAIL  FEMORAL;  Surgeon: Verlee Rossetti, MD;  Location: [facility] OR;  Service: Orthopedics;  Laterality: Left;  . HIP SURGERY Right 2002   ORIF  . ROTATOR CUFF REPAIR Left 1999   Tear  . THYROIDECTOMY  1952  . TONSILLECTOMY    . TOTAL KNEE ARTHROPLASTY Left 05/29/2015   Procedure: TOTAL KNEE ARTHROPLASTY;  Surgeon: Dannielle Huh, MD;  Location: [facility] OR;  Service: Orthopedics;  Laterality: Left;    OB History    No data available       Home Medications    Prior to Admission medications   Medication Sig Start Date End Date Taking? Authorizing Provider  aspirin EC 81 MG tablet Take 81 mg by mouth daily.    Historical Provider, MD  BAYER MICROLET LANCETS lancets Use as instructed to check blood sugar once daily.250.00 03/02/14   Kimber Relic, MD  doxycycline (VIBRA-TABS) 100 MG tablet Take 1 tablet (100 mg total) by mouth 2 (two) times daily. 12/29/15   Kimber Relic, MD  ferrous sulfate 325 (65 FE) MG tablet Take 325 mg by mouth daily.     Historical Provider, MD  furosemide (LASIX) 40 MG tablet Take one tablet by mouth once daily for edema 12/20/15   Kimber Relic, MD  glucose blood (BAYER CONTOUR TEST) test strip Use as instructed to check blood sugar once daily. 250.00 11/14/15   Kimber Relic, MD  levothyroxine (SYNTHROID, LEVOTHROID) 25 MCG tablet Take one tablet by mouth once daily for thyroid supplement 12/29/15   Kimber Relic, MD  Multiple Vitamin (MULTIVITAMIN WITH MINERALS) TABS tablet Take 1 tablet by mouth daily. Centrum    Historical Provider, MD    Family History Family History  Problem Relation Age of Onset  . Cancer Mother     Stomach  . Cancer Father     Prostate  . Emphysema Father   . Alcohol abuse Sister   . Liver disease Sister   . Kidney disease Brother   . Hypertension Daughter   . Cancer Brother     Prostate, Bladder  . Emphysema Brother   . Cancer Brother     Prostate  . Arthritis Daughter     Knees  . Heart murmur Son   . Mental illness Son     Autism      Social History Social History  Substance Use Topics  . Smoking status: Never Smoker  . Smokeless tobacco: Never Used  . Alcohol use No     Allergies   Review of patient's allergies indicates no known allergies.   Review of Systems Review of Systems  Constitutional: Negative for fever.  Respiratory: Negative for shortness of breath.   Cardiovascular: Positive for leg swelling. Negative for chest pain.  Skin: Positive for color change.  All other systems reviewed and are negative.    Physical Exam Updated Vital Signs BP 153/63   Pulse (!) 58   Temp 97.9 F (36.6 C) (Oral)  Resp 17   SpO2 100%   Physical Exam  Constitutional: She is oriented to person, place, and time. She appears well-developed and well-nourished.  Elderly, no acute distress  HENT:  Head: Normocephalic and atraumatic.  Cardiovascular: Normal rate, regular rhythm and normal heart sounds.   No murmur heard. Pulmonary/Chest: Effort normal and breath sounds normal. No respiratory distress. She has no wheezes.  Musculoskeletal:  Left greater than right lower extremity edema mostly around the lower leg and ankle, dry cracked skin, mild erythema, no significant induration, no significant warmth, 2+ DP pulse  Neurological: She is alert and oriented to person, place, and time.  Skin: Skin is warm.  Dry Skin, mild erythema  Psychiatric: She has a normal mood and affect.  Nursing note and vitals reviewed.    ED Treatments / Results  DIAGNOSTIC STUDIES:  Oxygen Saturation is 100% on RA, normal by my interpretation.    COORDINATION OF CARE:  12:40 AM Discussed treatment plan with pt at bedside and pt agreed to plan.  Labs (all labs ordered are listed, but only abnormal results are displayed) Labs Reviewed  I-STAT CHEM 8, ED - Abnormal; Notable for the following:       Result Value   Chloride 100 (*)    BUN 42 (*)    Creatinine, Ser 1.30 (*)    Glucose, Bld 119 (*)    Calcium, Ion 1.09 (*)     Hemoglobin 10.5 (*)    HCT 31.0 (*)    All other components within normal limits    EKG  EKG Interpretation None       Radiology No results found.  Procedures Procedures (including critical care time)  Medications Ordered in ED Medications  mineral oil-hydrophilic petrolatum (AQUAPHOR) ointment (not administered)     Initial Impression / Assessment and Plan / ED Course  I have reviewed the triage vital signs and the nursing notes.  Pertinent labs & imaging results that were available during my care of the patient were reviewed by me and considered in my medical decision making (see chart for details).  Clinical Course    Patient presents with concerns for leg swelling. This is in the setting of cellulitis. Reports that the redness has improved on doxycycline. Was seen by her primary physician multiple times. Documented improvement on 10/26 by PA.  Mostly concerned that swelling has moved to the ankle and is not improving with Lasix. Exam is notable for left greater than right lower sternum these swelling. This is been worked up and she is continuing a course of doxycycline which by report is making the cellulitis better. She has no systemic symptoms or infections. I reassured the patient and her daughter.  Chem-8 and pain. She can continue Lasix. Suspect that she has developed some dependent edema in this left leg. I have encouraged reinitiation of compression stockings. She also needs to apply Aquaphor moisturizer to the left leg given the dry skin. Follow-up with primary physician for recheck given that all of her follow-up has been her primary physician's office and they can compare to prior exams. No further workup at this time as patient has had a recent negative DVT study.  After history, exam, and medical workup I feel the patient has been appropriately medically screened and is safe for discharge home. Pertinent diagnoses were discussed with the patient. Patient was given  return precautions.   Final Clinical Impressions(s) / ED Diagnoses   Final diagnoses:  Cellulitis of left lower extremity  Lower  extremity edema    New Prescriptions New Prescriptions   No medications on file   I personally performed the services described in this documentation, which was scribed in my presence. The recorded information has been reviewed and is accurate.     Shon Batonourtney F Horton, MD 01/01/16 [id number]

## 2016-01-01 ENCOUNTER — Encounter (HOSPITAL_COMMUNITY): Admission: RE | Payer: Self-pay | Source: Ambulatory Visit

## 2016-01-01 ENCOUNTER — Inpatient Hospital Stay (HOSPITAL_COMMUNITY): Admission: RE | Admit: 2016-01-01 | Payer: Medicare Other | Source: Ambulatory Visit | Admitting: Orthopedic Surgery

## 2016-01-01 DIAGNOSIS — L03116 Cellulitis of left lower limb: Secondary | ICD-10-CM | POA: Diagnosis not present

## 2016-01-01 LAB — I-STAT CHEM 8, ED
BUN: 42 mg/dL — ABNORMAL HIGH (ref 6–20)
CALCIUM ION: 1.09 mmol/L — AB (ref 1.15–1.40)
CHLORIDE: 100 mmol/L — AB (ref 101–111)
Creatinine, Ser: 1.3 mg/dL — ABNORMAL HIGH (ref 0.44–1.00)
Glucose, Bld: 119 mg/dL — ABNORMAL HIGH (ref 65–99)
HEMATOCRIT: 31 % — AB (ref 36.0–46.0)
Hemoglobin: 10.5 g/dL — ABNORMAL LOW (ref 12.0–15.0)
Potassium: 3.9 mmol/L (ref 3.5–5.1)
SODIUM: 137 mmol/L (ref 135–145)
TCO2: 24 mmol/L (ref 0–100)

## 2016-01-01 SURGERY — ARTHROPLASTY, KNEE, TOTAL
Anesthesia: Spinal | Laterality: Right

## 2016-01-01 MED ORDER — AQUAPHOR EX OINT
TOPICAL_OINTMENT | Freq: Every day | CUTANEOUS | Status: DC | PRN
  Administered 2016-01-01: 01:00:00 via TOPICAL
  Filled 2016-01-01: qty 50

## 2016-01-01 NOTE — ED Notes (Signed)
Patient Alert and oriented X4. Stable and ambulatory. Patient verbalized understanding of the discharge instructions.  Patient belongings were taken by the patient.  

## 2016-01-01 NOTE — ED Notes (Signed)
EDP at bedside  

## 2016-01-01 NOTE — Discharge Instructions (Signed)
You were seen today with concerns for worsening swelling of the left leg. Your cellulitis appears to be improving. Continue doxycycline. You can also continue Lasix as prescribed. Start wearing her compression hose again to help with swelling. You need to make sure to moisturize the left leg to prevent further skin breakdown. Follow-up with her primary doctor as soon as possible for recheck.

## 2016-01-02 ENCOUNTER — Encounter: Payer: BC Managed Care – PPO | Admitting: Occupational Therapy

## 2016-01-04 ENCOUNTER — Other Ambulatory Visit: Payer: Self-pay | Admitting: Internal Medicine

## 2016-01-04 ENCOUNTER — Ambulatory Visit: Payer: Medicare Other | Admitting: Occupational Therapy

## 2016-01-05 ENCOUNTER — Telehealth: Payer: Self-pay | Admitting: *Deleted

## 2016-01-05 ENCOUNTER — Telehealth: Payer: Self-pay

## 2016-01-05 NOTE — Telephone Encounter (Signed)
Patient daughter [name] called and stated that patient's insurance no longer cover's patient's Contour blood sugar machine and they need one that is covered. Daughter is going to call the insurance company to see what they cover and let us know.

## 2016-01-05 NOTE — Telephone Encounter (Signed)
Fax received from [facility] on [address] indicating Contour Test Strips are no longer covered.   I called Angie (patient's caregiver) and informed her to contact patient's insurance company to find out which machine they do cover and to lets us know and we will send rx for lancets, test strips and machine to [facility]. Angie verbalized understanding

## 2016-01-09 ENCOUNTER — Ambulatory Visit: Payer: Medicare Other | Attending: Internal Medicine | Admitting: Occupational Therapy

## 2016-01-09 DIAGNOSIS — M6281 Muscle weakness (generalized): Secondary | ICD-10-CM | POA: Diagnosis not present

## 2016-01-09 DIAGNOSIS — R278 Other lack of coordination: Secondary | ICD-10-CM | POA: Insufficient documentation

## 2016-01-09 DIAGNOSIS — R2689 Other abnormalities of gait and mobility: Secondary | ICD-10-CM | POA: Diagnosis not present

## 2016-01-09 NOTE — Therapy (Signed)
[name] [address] [place], [place], [address] Phone: [phone]   Fax:  [phone]  Occupational Therapy Treatment  Patient Details  Name: Jessica Hawkins MRN: [id number] Date of Birth: May 18, 1922 Referring Provider: Dr Murray HodgkinsArthur Green  Encounter Date: 01/09/2016      OT End of Session - 01/09/16 1343    Visit Number 4   Number of Visits 12   Date for OT Re-Evaluation 01/25/16   Authorization Type Medicare Part A & Medicare Part B   Authorization Time Period G code every 10th visit   Authorization - Visit Number 4   Authorization - Number of Visits 10   OT Start Time 1105   OT Stop Time 1150   OT Time Calculation (min) 45 min   Activity Tolerance Patient tolerated treatment well      Past Medical History:  Diagnosis Date  . Acute blood loss anemia   . Anginal pain [facility](HCC)    'i haven't had them in a long time but i used to'  . Arthritis    Osteoarthritis  . BBB (bundle branch block)    RT  . Coronary atherosclerosis of unspecified type of vessel, native or graft   . Debility, unspecified   . Diabetes mellitus without complication (HCC)    Diet controlled  . Diaphragmatic hernia without mention of obstruction or gangrene   . Disturbance of skin sensation   . Edema   . Fibrocystic breast   . Gait disorder   . GERD (gastroesophageal reflux disease) 06/29/2011  . Hiatal hernia   . History of transfusion of packed red blood cells   . Hyperlipidemia   . Hypertension 06/29/11  . Hypothyroid 06/29/2011  . Insomnia   . OAB (overactive bladder)   . Osteoporosis   . Platelet disorder (HCC)    ? Von Willebrand; abnormal PLT function assay 07/04/11   . Unspecified constipation   . Unspecified sleep apnea   . Unspecified urinary incontinence   . Unspecified vitamin D deficiency   . Unsteady gait   . Vertigo     Past Surgical History:  Procedure Laterality Date  . EYE SURGERY Right 2005   Cataract  surgery  . EYE SURGERY Left 2006   Cataract surgery  . FEMUR IM NAIL  06/29/2011   Procedure: INTRAMEDULLARY (IM) NAIL FEMORAL;  Surgeon: Verlee RossettiSteven R Norris, MD;  [facility];  Service: Orthopedics;  Laterality: Left;  . HIP SURGERY Right 2002   ORIF  . ROTATOR CUFF REPAIR Left 1999   Tear  . THYROIDECTOMY  1952  . TONSILLECTOMY    . TOTAL KNEE ARTHROPLASTY Left 05/29/2015   Procedure: TOTAL KNEE ARTHROPLASTY;  Surgeon: Dannielle HuhSteve Lucey, MD;  Location: [facility] OR;  Service: Orthopedics;  Laterality: Left;    There were no vitals filed for this visit.      Subjective Assessment - 01/09/16 1115    Subjective  She already has all the equipment she needs; we've got that covered. She needs to work on [employer] Corporationmemory strategies (per daughter? or caregiver report)   Patient is accompained by: --  ? if daughter or caregiver was accompanying her tdoay   Patient Stated Goals Improve balance    Currently in Pain? Yes   Pain Score 2    Pain Location Leg   Pain Orientation Left   Pain Descriptors / Indicators Sore   Pain Frequency Intermittent   Aggravating Factors  touching leg, walking    Pain Relieving Factors  rest                      OT Treatments/Exercises (OP) - 01/09/16 0001      ADLs   ADL Comments Reviewed fall prevention and safety strategies with pt/family. Pt's family member reports she has all the DME she needs and we don't need to review. Issued memory compensatory strategies and cognitive tips. Reviewed memory strategies and how to implement at home, however did not have time to review cognitive tips, therefore to review next session.    General Comments Pt was accompanied today with either daughter or caregiver (she did not specify) - however, she did not wish to work on discussing tub transfer safety and LE dressing techniques.                 OT Education - 01/09/16 1342    Education provided Yes   Education Details memory compensatory strategies, review of fall  prevention   Person(s) Educated Patient;Caregiver(s)   Methods Explanation;Handout   Comprehension Verbalized understanding;Verbal cues required          OT Short Term Goals - 11/30/15 1301      OT SHORT TERM GOAL #1   Title Pt Min assist for memory strategies   Baseline Consistent/Mod vc's and repetetion for new information;    Time 3   Period Weeks   Status New     OT SHORT TERM GOAL #2   Title Pt will be Min A safety atrategies in the kitchen for simple meal prep   Baseline Currently having meals prepared by caregiver/family   Time 3   Period Weeks   Status New     OT SHORT TERM GOAL #3   Title Pt will be supervision for LB dressing and ADL's w/ a/e PRN   Baseline Supervisoin LB ADL's except max assist for compression hose   Time 3   Period Weeks   Status New           OT Long Term Goals - 11/30/15 1302      OT LONG TERM GOAL #1   Title Pt will be Mod I-supervision memory strategies    Time 6   Period Weeks   Status New     OT LONG TERM GOAL #2   Title Pt will be supervision level for simple meal prep/snack in ADL kitchen w/o LOB & adhering to safety when reaching for items in cabinets, stove, dishwasher and refridgerator    Time 6   Period Weeks   Status New     OT LONG TERM GOAL #3   Title Pt will be Mod I LB dressing w/ or w/o a/e PRN sit to stand   Time 6   Period Weeks   Status New     OT LONG TERM GOAL #4   Title Pt will demonstrate improved coordination as seen by imrpoved 9 hole peg score by 5 or more seconds left non-dominant hand   Time 6   Period Weeks   Status New     OT LONG TERM GOAL #5   Title Pt will be supervsion for simulated tub transfer using DME PRN for safety   Time 6   Period Weeks   Status New               Plan - 01/09/16 1343    Clinical Impression Statement Pt with significant memory deficits with new information. Rt knee surgery was postponed d/t recent ED  visits/infection.    Clinical Impairments Affecting  Rehab Potential Age related memory issues; Pt wish to return to living alone; need for R TKR per pt/family report   OT Frequency 2x / week   OT Duration 6 weeks   OT Treatment/Interventions Self-care/ADL training;Therapeutic exercise;DME and/or AE instruction;Building services engineerunctional Mobility Training;Therapeutic activities;Patient/family education;Balance training;Cognitive remediation/compensation;Energy conservation;Therapeutic exercises   Plan review cognitive tips issued this session, ADLS, discussion re: inability to live alone. ? D/C at last scheduled appt (next session) vs. continue      Patient will benefit from skilled therapeutic intervention in order to improve the following deficits and impairments:  Abnormal gait, Decreased balance, Decreased mobility, Difficulty walking, Decreased cognition, Decreased activity tolerance, Decreased knowledge of use of DME, Decreased safety awareness, Decreased coordination, Impaired perceived functional ability, Decreased knowledge of precautions  Visit Diagnosis: Other abnormalities of gait and mobility    Problem List Patient Active Problem List   Diagnosis Date Noted  . Anemia 12/20/2015  . Cellulitis 12/20/2015  . Cellulitis and abscess of leg 12/20/2015  . LLQ abdominal pain 12/06/2015  . Self-care deficit in patient living alone 11/08/2015  . Peripheral nerve contusion 08/15/2015  . Foot drop, left 07/18/2015  . S/P total knee arthroplasty 05/29/2015  . Bleeding disorder (HCC) 05/22/2015  . Primary osteoarthritis of both knees 05/04/2015  . Nonspecific vaginitis 04/11/2015  . Recurrent UTI 03/07/2015  . Joint mouse 03/07/2015  . Right shoulder pain 01/18/2015  . Pre-operative cardiovascular examination 12/07/2014  . Loss of weight 10/13/2013  . Constipation 10/06/2013  . Venous insufficiency (chronic) (peripheral) 02/02/2013  . Knee pain 12/16/2012  . Arthritis of knee, degenerative 10/29/2012  . Edema 07/23/2012  . Abnormality of gait  07/23/2012  . Urinary incontinence 07/23/2012  . Hyperlipidemia 07/23/2012  . Controlled type 2 DM with peripheral circulatory disorder (HCC) 07/23/2012  . GERD (gastroesophageal reflux disease) 06/29/2011  . HTN (hypertension) 06/29/2011  . Intertrochanteric fracture of left femur (HCC) 06/29/2011  . Arthritis 06/29/2011  . Hypothyroid 06/29/2011    Kelli ChurnBallie, Jalyn Dutta Johnson, OTR/L 01/09/2016, 1:49 PM  [place] [facility] [address] [place], [place], [address] Phone: [phone]   Fax:  [phone]  Name: Jessica Munchauline G Pinson MRN: [id number] Date of Birth: [birth year]

## 2016-01-09 NOTE — Patient Instructions (Addendum)
Memory Compensation Strategies  1. Use "WARM" strategy.  W= write it down  A= associate it  R= repeat it  M= make a mental note  2.   You can keep a Memory Notebook.  Use a 3-ring notebook with sections for the following: calendar, important names and phone numbers,  medications, doctors' names/phone numbers, lists/reminders, and a section to journal what you did  each day.   3.    Use a calendar to write appointments down.  4.    Write yourself a schedule for the day.  This can be placed on the calendar or in a separate section of the Memory Notebook.  Keeping a  regular schedule can help memory.  5.    Use medication organizer with sections for each day or morning/evening pills.  You may need help loading it  6.    Keep a basket, or pegboard by the door.  Place items that you need to take out with you in the basket or on the pegboard.  You may also want to  include a message board for reminders.  7.    Use sticky notes.  Place sticky notes with reminders in a place where the task is performed.  For example: " turn off the  stove" placed by the stove, "lock the door" placed on the door at eye level, " take your medications" on  the bathroom mirror or by the place where you normally take your medications.  8.    Use alarms/timers.  Use while cooking to remind yourself to check on food or as a reminder to take your medicine, or as a  reminder to make a call, or as a reminder to perform another task, etc.     Cognitive Tips  Keep a journal/notebook with sections for the following (or use sections separately as needed):  Calendar and appointment sheet, schedule for each day, lists of reminders (such as grocery lists or "to do" list), homework assignments for therapy, important information such as family and friends names / addresses / phone numbers, medications, medical history and doctors name / phone numbers.  Avoid / remove clutter and unnecessary items from areas such as  countertops / cabinets in kitchen and bathroom, closets, etc.  Organize items by purpose.  Baskets and bins help with this.  Leave notes for reminders above task to be completed.  For example: Note to turn off stove over the the stove; note to lock door beside the door, not to brush teeth then wash face by sink, note to take medication on table etc.  To help recall names of people of people or things, mentally or verbally go through the alphabet to try to determine the 1st letter of the word as this may trigger the name or word you are looking for.  If this is too difficult and someone else knows the word, have them give you the first letter by asking, "does it start with an "A", "B", "C" etc. (or have them give you the first sound of the word or some other clue).  Review family events, occasions, names, etc.  Pictures are a good way to trigger memory.  Have others correct you if you answer something incorrectly.  Have them speak slowly with a few words to give you time to process and respond.  Don't let others automatically problem-solve. (For example: don't let them automatically lay out clothes in the correct position, but hand it to you folded so that you can figure   it out for yourself.)  However, if you need help with tasks, they should give you as little as they can so that you can be successful.  If appropriate and safe, they may allow you to make mistakes so that you can figure out how to correct the error.  (For example, they may allow you to put your shoes on the wrong foot to see if you notice that is wrong).  If you struggle, they should give you a cue.  (Example: "Do your shoes feel right?"  "Do they look right?")  

## 2016-01-10 ENCOUNTER — Ambulatory Visit (INDEPENDENT_AMBULATORY_CARE_PROVIDER_SITE_OTHER): Payer: Medicare Other | Admitting: Internal Medicine

## 2016-01-10 ENCOUNTER — Encounter: Payer: Self-pay | Admitting: Internal Medicine

## 2016-01-10 VITALS — BP 148/70 | HR 64 | Temp 97.2°F | Ht <= 58 in | Wt 135.0 lb

## 2016-01-10 DIAGNOSIS — I10 Essential (primary) hypertension: Secondary | ICD-10-CM

## 2016-01-10 DIAGNOSIS — R609 Edema, unspecified: Secondary | ICD-10-CM | POA: Diagnosis not present

## 2016-01-10 DIAGNOSIS — M25569 Pain in unspecified knee: Secondary | ICD-10-CM | POA: Diagnosis not present

## 2016-01-10 DIAGNOSIS — L03116 Cellulitis of left lower limb: Secondary | ICD-10-CM | POA: Diagnosis not present

## 2016-01-10 DIAGNOSIS — M170 Bilateral primary osteoarthritis of knee: Secondary | ICD-10-CM

## 2016-01-10 DIAGNOSIS — M21372 Foot drop, left foot: Secondary | ICD-10-CM

## 2016-01-10 NOTE — Progress Notes (Signed)
Facility  [facility]    Place of Service:   OFFICE    No Known Allergies  Chief Complaint  Patient presents with  . Medical Management of Chronic Issues    4 week follow-up on left lower leg cellulitis. Here with Care taker Levada Dy, daughter [name]  . Follow-up    12/31/15 went to ED for left leg    HPI:  Last seen by me 12/20/15. Subsequently seen on 12/25/15 by Joelene Millin, NP, then on 12/31/15 by the ER.   Cellulitis has resolved. Resume plans with Dr. Ronnie Derby for surgery. She can resume exercising in the pool for water exercises.  Contour glucometer strips are no longer covered by her insurance.  Medications: Patient's Medications  New Prescriptions   No medications on file  Previous Medications   ASPIRIN EC 81 MG TABLET    Take 81 mg by mouth daily.   BAYER CONTOUR TEST TEST STRIP    TEST once daily   BAYER MICROLET LANCETS LANCETS    Use as instructed to check blood sugar once daily.250.00   FERROUS SULFATE 325 (65 FE) MG TABLET    Take 325 mg by mouth daily.    FUROSEMIDE (LASIX) 40 MG TABLET    Take one tablet by mouth once daily for edema   LEVOTHYROXINE (SYNTHROID, LEVOTHROID) 25 MCG TABLET    Take one tablet by mouth once daily for thyroid supplement   MULTIPLE VITAMIN (MULTIVITAMIN WITH MINERALS) TABS TABLET    Take 1 tablet by mouth daily. Centrum  Modified Medications   No medications on file  Discontinued Medications   DOXYCYCLINE (VIBRA-TABS) 100 MG TABLET    Take 1 tablet (100 mg total) by mouth 2 (two) times daily.    Review of Systems  Constitutional: Positive for activity change. Negative for appetite change, chills, fatigue, fever and unexpected weight change.  Respiratory: Negative for cough, shortness of breath and wheezing.   Cardiovascular: Positive for leg swelling. Negative for chest pain and palpitations.  Gastrointestinal: Negative for abdominal distention, abdominal pain, constipation, diarrhea and vomiting.       Incontinent of stool and urine  sometimes  Genitourinary:       Hx of yeast infections  Musculoskeletal: Positive for arthralgias (Right knee and right shoulder), gait problem and myalgias.       Right shoulder pain. Right knee pain and weakness.  Skin: Negative.   Neurological: Positive for weakness (Left leg) and numbness (Left foot).       Left foot drop. Mild memory deficit.  Psychiatric/Behavioral: Positive for confusion (answers must be repeated seveeral times for her.).    Vitals:   01/10/16 1308  BP: (!) 148/70  Pulse: 64  Temp: 97.2 F (36.2 C)  TempSrc: Oral  Weight: 135 lb (61.2 kg)  Height: 4' 10" (1.473 m)   Body mass index is 28.22 kg/m. Wt Readings from Last 3 Encounters:  01/10/16 135 lb (61.2 kg)  12/28/15 138 lb (62.6 kg)  12/25/15 138 lb 6.4 oz (62.8 kg)      Physical Exam  Constitutional: She is oriented to person, place, and time. She appears well-developed and well-nourished. No distress.  frail  HENT:  Head: Normocephalic and atraumatic.  Eyes: Conjunctivae and EOM are normal. Pupils are equal, round, and reactive to light.  Neck: Normal range of motion. Neck supple. No JVD present.  Cardiovascular: Normal rate, regular rhythm, normal heart sounds and intact distal pulses.   Varicose veins.  Pulmonary/Chest: Effort normal and breath  sounds normal. No respiratory distress. She has no wheezes. She has no rales.  Abdominal: Soft. Bowel sounds are normal.  Musculoskeletal: She exhibits edema (1+ right and 2+ to left) and tenderness (to left leg and calf).  Left foot drop. Significant weakness with attempts at dorsiflexion. Negative homans sign.  Lymphadenopathy:       Left: Inguinal adenopathy present.  Neurological: She is alert and oriented to person, place, and time. Coordination normal.  Decreased sensation left foot. Left foot drop and weakness with dorsiflexion. Memory deficit.  Skin:  erythema of the left leg has resolved.    Labs reviewed: Lab Summary Latest Ref Rng  & Units 01/01/2016 12/20/2015  Hemoglobin 12.0 - 15.0 g/dL 10.5(L) (None)  Hematocrit 36.0 - 46.0 % 31.0(L) (None)  White count - (None) (None)  Platelet count - (None) (None)  Sodium 135 - 145 mmol/L 137 134(L)  Potassium 3.5 - 5.1 mmol/L 3.9 5.3  Calcium 8.6 - 10.4 mg/dL (None) 9.6  Phosphorus - (None) (None)  Creatinine 0.44 - 1.00 mg/dL 1.30(H) 1.47(H)  AST 10 - 35 U/L (None) 28  Alk Phos 33 - 130 U/L (None) 85  Bilirubin 0.2 - 1.2 mg/dL (None) 0.7  Glucose 65 - 99 mg/dL 119(H) 142(H)  Cholesterol - (None) (None)  HDL cholesterol - (None) (None)  Triglycerides - (None) (None)  LDL Direct - (None) (None)  LDL Calc - (None) (None)  Total protein 6.1 - 8.1 g/dL (None) 7.4  Albumin 3.6 - 5.1 g/dL (None) 3.8  Some recent data might be hidden   Lab Results  Component Value Date   TSH 1.030 07/21/2015   TSH 0.755 12/02/2014   TSH 1.040 05/05/2014   Lab Results  Component Value Date   BUN 42 (H) 01/01/2016   BUN 45 (H) 12/20/2015   BUN 38 (H) 10/23/2015   Lab Results  Component Value Date   HGBA1C 5.9 (H) 12/20/2015   HGBA1C 6.0 (H) 07/21/2015   HGBA1C 6.1 (H) 05/19/2015   12/25/15 Venous US of the lower extremity:  - No evidence of deep vein or superficial thrombosis involving the   left lower extremity and right common femoral vein. - Interstitial fluid noted in the left calf. - Multiple enlarged lymph nodes noted in the thigh and inguinal   area.  Assessment/Plan  1. Cellulitis of left lower extremity Resolved. Approved to proceed with planning for knee surgery. May resume water aerobics  2. Primary osteoarthritis of both knees planning surgery on the left knee  3. Edema, unspecified type Improved by used medium compression stackings  4. Foot drop, left improved  5. Essential hypertension Adequately controlled  6. Arthralgia of lower leg, unspecified laterality Left knee pains.

## 2016-01-11 ENCOUNTER — Ambulatory Visit: Payer: Medicare Other | Admitting: Occupational Therapy

## 2016-01-11 DIAGNOSIS — M6281 Muscle weakness (generalized): Secondary | ICD-10-CM

## 2016-01-11 DIAGNOSIS — R2689 Other abnormalities of gait and mobility: Secondary | ICD-10-CM

## 2016-01-11 DIAGNOSIS — R278 Other lack of coordination: Secondary | ICD-10-CM | POA: Diagnosis not present

## 2016-01-11 NOTE — Therapy (Signed)
[facility] [address] [place], [place], [address] Phone: [phone]   Fax:  [phone]  Occupational Therapy Treatment  Patient Details  Name: Jessica Hawkins MRN: [id number] Date of Birth: Aug 06, 1922 Referring Provider: Dr Murray Hodgkins  Encounter Date: 01/11/2016      OT End of Session - 01/11/16 1624    Visit Number 5   Number of Visits 12   Date for OT Re-Evaluation 01/25/16   Authorization Type Medicare Part A & Medicare Part B   Authorization Time Period G code every 10th visit   Authorization - Visit Number 5   Authorization - Number of Visits 10   OT Start Time 1105   OT Stop Time 1200   OT Time Calculation (min) 55 min   Activity Tolerance Patient tolerated treatment well      Past Medical History:  Diagnosis Date  . Acute blood loss anemia   . Anginal pain [facility])    'i haven't had them in a long time but i used to'  . Arthritis    Osteoarthritis  . BBB (bundle branch block)    RT  . Coronary atherosclerosis of unspecified type of vessel, native or graft   . Debility, unspecified   . Diabetes mellitus without complication (HCC)    Diet controlled  . Diaphragmatic hernia without mention of obstruction or gangrene   . Disturbance of skin sensation   . Edema   . Fibrocystic breast   . Gait disorder   . GERD (gastroesophageal reflux disease) 06/29/2011  . Hiatal hernia   . History of transfusion of packed red blood cells   . Hyperlipidemia   . Hypertension 06/29/11  . Hypothyroid 06/29/2011  . Insomnia   . OAB (overactive bladder)   . Osteoporosis   . Platelet disorder (HCC)    ? Von Willebrand; abnormal PLT function assay 07/04/11   . Unspecified constipation   . Unspecified sleep apnea   . Unspecified urinary incontinence   . Unspecified vitamin D deficiency   . Unsteady gait   . Vertigo     Past Surgical History:  Procedure Laterality Date  . EYE SURGERY Right 2005   Cataract  surgery  . EYE SURGERY Left 2006   Cataract surgery  . FEMUR IM NAIL  06/29/2011   Procedure: INTRAMEDULLARY (IM) NAIL FEMORAL;  Surgeon: Verlee Rossetti, MD;  Location: [facility] OR;  Service: Orthopedics;  Laterality: Left;  . HIP SURGERY Right 2002   ORIF  . ROTATOR CUFF REPAIR Left 1999   Tear  . THYROIDECTOMY  1952  . TONSILLECTOMY    . TOTAL KNEE ARTHROPLASTY Left 05/29/2015   Procedure: TOTAL KNEE ARTHROPLASTY;  Surgeon: Dannielle Huh, MD;  Location: [facility] OR;  Service: Orthopedics;  Laterality: Left;    There were no vitals filed for this visit.      Subjective Assessment - 01/11/16 1149    Subjective  Do you think I need to come back?    Patient Stated Goals Improve balance    Currently in Pain? No/denies                      OT Treatments/Exercises (OP) - 01/11/16 0001      ADLs   LB Dressing (P)  Discussed options for donning compression hose and shown stocking donner and how/where to purchase. To practice next session to see if this is realistic option   Functional Mobility (P)  Discussed recommendations for P.T.  evaluation - however pt/caregiver and therapist discussed getting both home health O.T. and P.T. following pending Rt knee surgery and will wait until then for P.T.    ADL Comments (P)  Discussed safety concerns and recommended NO cooking unless under direct supervision d/t memory and balance deficits. ALso discussed [employer] (pt already has, but not consistently using and recommended her get in the habit of putting on daily even with caregivers). Also discussed/reviewed cognitive tips and memory strategies with pt/caregiver including memory notebook, consistent routine and cueing from caregivers.    General Comments (P)  Revised/updated goals per therapist and pt/caregiver discussion and what to focus on the following 2 visits                OT Education - 01/11/16 1623    Education provided Yes   Education Details POC, possible A/E needs, review of  cognitive tips and memory strategies   Person(s) Educated Patient;Caregiver(s)   Methods Explanation;Handout   Comprehension Verbalized understanding          OT Short Term Goals - 01/11/16 1625      OT SHORT TERM GOAL #1   Title Pt/caregiver to verbalize understanding with memory strategies   Baseline Consistent/Mod vc's and repetetion for new information;    Time 3   Period Weeks   Status Achieved  revised     OT SHORT TERM GOAL #2   Title Pt/family will verbalize understanding with safety strategies in house as well as for simple meal prep    Baseline Currently having meals prepared by caregiver/family   Time 3   Period Weeks   Status Revised  and ongoing     OT SHORT TERM GOAL #3   Title Pt will be supervision for LB dressing and ADL's w/ a/e PRN   Baseline Supervisoin LB ADL's except max assist for compression hose   Time 3   Period Weeks   Status On-going           OT Long Term Goals - 01/11/16 1626      OT LONG TERM GOAL #1   Title Pt will be Mod I-supervision memory strategies    Time 6   Period Weeks   Status On-going     OT LONG TERM GOAL #2   Title Pt will be supervision level for simple meal prep/snack in ADL kitchen w/o LOB & adhering to safety when reaching for items in cabinets, stove, dishwasher and refridgerator    Time 6   Period Weeks   Status On-going     OT LONG TERM GOAL #3   Title Pt will be Mod I LB dressing w/ or w/o a/e PRN sit to stand   Time 6   Period Weeks   Status Deferred  see STG     OT LONG TERM GOAL #4   Title Pt/family will verbalize understanding with simple coordination HEP for Lt hand   Time 6   Period Weeks   Status Revised     OT LONG TERM GOAL #5   Title Pt will be supervsion for simulated tub transfer using DME PRN for safety   Time 6   Period Weeks   Status Achieved  PER CAREGIVER REPORT               Plan - 01/11/16 1628    Clinical Impression Statement Revised STG's and LTG's per  discussion today with patient/caregiver and as appropriate to pt's current status/level of function.    Clinical  Impairments Affecting Rehab Potential Age related memory issues; Pt wish to return to living alone; need for R TKR per pt/family report   OT Frequency 2x / week   OT Duration 6 weeks   OT Treatment/Interventions Self-care/ADL training;Therapeutic exercise;DME and/or AE instruction;Building services engineerunctional Mobility Training;Therapeutic activities;Patient/family education;Balance training;Cognitive remediation/compensation;Energy conservation;Therapeutic exercises   Plan Pt/caregiver agreed to come 2x/wk for 1 more week: will address practicing donning compression hose with stocking donner (to see if pt is able to use) and simple coordination HEP for Lt hand next session. Last session: walker negotiation and safety for balance with retrieving snack/simple meal prep (no stovetop)    Consulted and Agree with Plan of Care Patient;Family member/caregiver   Family Member Consulted Caregiver - Angie      Patient will benefit from skilled therapeutic intervention in order to improve the following deficits and impairments:  Abnormal gait, Decreased balance, Decreased mobility, Difficulty walking, Decreased cognition, Decreased activity tolerance, Decreased knowledge of use of DME, Decreased safety awareness, Decreased coordination, Impaired perceived functional ability, Decreased knowledge of precautions  Visit Diagnosis: Other abnormalities of gait and mobility  Muscle weakness (generalized)    Problem List Patient Active Problem List   Diagnosis Date Noted  . Anemia 12/20/2015  . Cellulitis and abscess of leg 12/20/2015  . LLQ abdominal pain 12/06/2015  . Self-care deficit in patient living alone 11/08/2015  . Peripheral nerve contusion 08/15/2015  . Foot drop, left 07/18/2015  . S/P total knee arthroplasty 05/29/2015  . Bleeding disorder (HCC) 05/22/2015  . Primary osteoarthritis of both knees  05/04/2015  . Nonspecific vaginitis 04/11/2015  . Recurrent UTI 03/07/2015  . Joint mouse 03/07/2015  . Right shoulder pain 01/18/2015  . Pre-operative cardiovascular examination 12/07/2014  . Loss of weight 10/13/2013  . Constipation 10/06/2013  . Venous insufficiency (chronic) (peripheral) 02/02/2013  . Knee pain 12/16/2012  . Arthritis of knee, degenerative 10/29/2012  . Edema 07/23/2012  . Abnormality of gait 07/23/2012  . Urinary incontinence 07/23/2012  . Hyperlipidemia 07/23/2012  . Controlled type 2 DM with peripheral circulatory disorder (HCC) 07/23/2012  . GERD (gastroesophageal reflux disease) 06/29/2011  . HTN (hypertension) 06/29/2011  . Intertrochanteric fracture of left femur (HCC) 06/29/2011  . Arthritis 06/29/2011  . Hypothyroid 06/29/2011    Kelli ChurnBallie, Angeliki Mates Johnson, OTR/L 01/11/2016, 4:52 PM  [facility] [address] [place], [place], [address] Phone: [phone]   Fax:  [phone]  Name: Jessica Hawkins MRN: [id number] Date of Birth: 09/26/1922

## 2016-01-15 ENCOUNTER — Telehealth: Payer: Self-pay | Admitting: *Deleted

## 2016-01-15 NOTE — Telephone Encounter (Signed)
Jessica Hawkins, daughter called and stated that patient was seen and Dr. Chilton SiGreen Approved Clearance for patient to have surgery and they need the note faxed to Dr. Queen BlossomSteven Lucy at Fax: [phone]. Faxed note.

## 2016-01-16 ENCOUNTER — Ambulatory Visit: Payer: Medicare Other | Admitting: Occupational Therapy

## 2016-01-16 ENCOUNTER — Encounter: Payer: Self-pay | Admitting: Occupational Therapy

## 2016-01-16 DIAGNOSIS — M6281 Muscle weakness (generalized): Secondary | ICD-10-CM

## 2016-01-16 DIAGNOSIS — R2689 Other abnormalities of gait and mobility: Secondary | ICD-10-CM | POA: Diagnosis not present

## 2016-01-16 DIAGNOSIS — R278 Other lack of coordination: Secondary | ICD-10-CM | POA: Diagnosis not present

## 2016-01-16 NOTE — Therapy (Signed)
[facility] [address] [place], [place], [address] Phone: [phone]   Fax:  [phone]  Occupational Therapy Treatment  Patient Details  Name: Jessica Hawkins MRN: [id number] Date of Birth: 08-03-1922 Referring Provider: Dr Murray HodgkinsArthur Green  Encounter Date: 01/16/2016      OT End of Session - 01/16/16 1700    Visit Number 6   Number of Visits 12   Date for OT Re-Evaluation 01/25/16   Authorization Type Medicare Part A & Medicare Part B   Authorization Time Period G code every 10th visit   Authorization - Visit Number 6   Authorization - Number of Visits 10   OT Start Time 1447   OT Stop Time 1529   OT Time Calculation (min) 42 min   Activity Tolerance Patient tolerated treatment well      Past Medical History:  Diagnosis Date  . Acute blood loss anemia   . Anginal pain [facility](HCC)    'i haven't had them in a long time but i used to'  . Arthritis    Osteoarthritis  . BBB (bundle branch block)    RT  . Coronary atherosclerosis of unspecified type of vessel, native or graft   . Debility, unspecified   . Diabetes mellitus without complication (HCC)    Diet controlled  . Diaphragmatic hernia without mention of obstruction or gangrene   . Disturbance of skin sensation   . Edema   . Fibrocystic breast   . Gait disorder   . GERD (gastroesophageal reflux disease) 06/29/2011  . Hiatal hernia   . History of transfusion of packed red blood cells   . Hyperlipidemia   . Hypertension 06/29/11  . Hypothyroid 06/29/2011  . Insomnia   . OAB (overactive bladder)   . Osteoporosis   . Platelet disorder (HCC)    ? Von Willebrand; abnormal PLT function assay 07/04/11   . Unspecified constipation   . Unspecified sleep apnea   . Unspecified urinary incontinence   . Unspecified vitamin D deficiency   . Unsteady gait   . Vertigo     Past Surgical History:  Procedure Laterality Date  . EYE SURGERY Right 2005   Cataract  surgery  . EYE SURGERY Left 2006   Cataract surgery  . FEMUR IM NAIL  06/29/2011   Procedure: INTRAMEDULLARY (IM) NAIL FEMORAL;  Surgeon: Verlee RossettiSteven R Norris, MD;  [facility];  Service: Orthopedics;  Laterality: Left;  . HIP SURGERY Right 2002   ORIF  . ROTATOR CUFF REPAIR Left 1999   Tear  . THYROIDECTOMY  1952  . TONSILLECTOMY    . TOTAL KNEE ARTHROPLASTY Left 05/29/2015   Procedure: TOTAL KNEE ARTHROPLASTY;  Surgeon: Dannielle HuhSteve Lucey, MD;  Location: [facility] OR;  Service: Orthopedics;  Laterality: Left;    There were no vitals filed for this visit.      Subjective Assessment - 01/16/16 1455    Subjective  I think I can get these stockings on by myself   Pertinent History see epic   Patient Stated Goals Improve balance    Currently in Pain? Yes   Pain Score 3    Pain Location Leg   Pain Orientation Left   Pain Type Chronic pain   Pain Frequency Intermittent   Aggravating Factors  walking, touching leg   Pain Relieving Factors rest, aleve                      OT Treatments/Exercises (OP) -  01/16/16 0001      ADLs   LB Dressing Addressed donning and doffing compression hose and shoes - pt able to do with LH shoe horn only. Pt completed task x2 therefore do not recommend equipment to don socks. Discussed with pt and caregiver that since she can do the activity without equipment there is no reason to recommend equipment.  Pt stated "its alot harder with that piece of equipment"      Fine Motor Coordination   Other Fine Motor Exercises Issued HEP for fine motor control - pt will need caregiver to structure activities due to cognitive deficits.  Pt able to return demonstrate all activities and pt and caregiver given activities in written form as well.                OT Education - 01/16/16 1658    Education provided Yes   Education Details [facility] HEP   Person(s) Educated Patient;Caregiver(s)   Methods Explanation;Demonstration;Verbal cues;Handout   Comprehension  Verbalized understanding;Returned demonstration  with cargiver assistance          OT Short Term Goals - 01/16/16 1658      OT SHORT TERM GOAL #1   Title Pt/caregiver to verbalize understanding with memory strategies   Baseline Consistent/Mod vc's and repetetion for new information;    Time 3   Period Weeks   Status Achieved  revised     OT SHORT TERM GOAL #2   Title Pt/family will verbalize understanding with safety strategies in house as well as for simple meal prep    Baseline Currently having meals prepared by caregiver/family   Time 3   Period Weeks   Status Revised  and ongoing     OT SHORT TERM GOAL #3   Title Pt will be supervision for LB dressing and ADL's w/ a/e PRN   Baseline Supervisoin LB ADL's except max assist for compression hose   Time 3   Period Weeks   Status Achieved           OT Long Term Goals - 01/16/16 1658      OT LONG TERM GOAL #1   Title Pt will be Mod I-supervision memory strategies    Time 6   Period Weeks   Status On-going     OT LONG TERM GOAL #2   Title Pt will be supervision level for simple meal prep/snack in ADL kitchen w/o LOB & adhering to safety when reaching for items in cabinets, stove, dishwasher and refridgerator    Time 6   Period Weeks   Status On-going     OT LONG TERM GOAL #3   Title Pt will be Mod I LB dressing w/ or w/o a/e PRN sit to stand   Time 6   Period Weeks   Status Deferred  see STG     OT LONG TERM GOAL #4   Title Pt/family will verbalize understanding with simple coordination HEP for Lt hand   Time 6   Period Weeks   Status Achieved     OT LONG TERM GOAL #5   Title Pt will be supervsion for simulated tub transfer using DME PRN for safety   Time 6   Period Weeks   Status Achieved  PER CAREGIVER REPORT               Plan - 01/16/16 1659    Clinical Impression Statement Pt making gains toward LTG's - pt able to don and doff compression hose and  shoes with only LH shoe horn.     Rehab Potential Fair   Clinical Impairments Affecting Rehab Potential Age related memory issues; Pt wish to return to living alone; need for R TKR per pt/family report   OT Frequency 2x / week   OT Duration 6 weeks   OT Treatment/Interventions Self-care/ADL training;Therapeutic exercise;DME and/or AE instruction;Building services engineer;Therapeutic activities;Patient/family education;Balance training;Cognitive remediation/compensation;Energy conservation;Therapeutic exercises   Plan Address remaining LTG's and d/c from OT   Consulted and Agree with Plan of Care Patient;Family member/caregiver   Family Member Consulted Caregiver - Angie      Patient will benefit from skilled therapeutic intervention in order to improve the following deficits and impairments:  Abnormal gait, Decreased balance, Decreased mobility, Difficulty walking, Decreased cognition, Decreased activity tolerance, Decreased knowledge of use of DME, Decreased safety awareness, Decreased coordination, Impaired perceived functional ability, Decreased knowledge of precautions  Visit Diagnosis: Muscle weakness (generalized)  Other lack of coordination    Problem List Patient Active Problem List   Diagnosis Date Noted  . Anemia 12/20/2015  . Cellulitis and abscess of leg 12/20/2015  . LLQ abdominal pain 12/06/2015  . Self-care deficit in patient living alone 11/08/2015  . Peripheral nerve contusion 08/15/2015  . Foot drop, left 07/18/2015  . S/P total knee arthroplasty 05/29/2015  . Bleeding disorder (HCC) 05/22/2015  . Primary osteoarthritis of both knees 05/04/2015  . Nonspecific vaginitis 04/11/2015  . Recurrent UTI 03/07/2015  . Joint mouse 03/07/2015  . Right shoulder pain 01/18/2015  . Pre-operative cardiovascular examination 12/07/2014  . Loss of weight 10/13/2013  . Constipation 10/06/2013  . Venous insufficiency (chronic) (peripheral) 02/02/2013  . Knee pain 12/16/2012  . Arthritis of knee,  degenerative 10/29/2012  . Edema 07/23/2012  . Abnormality of gait 07/23/2012  . Urinary incontinence 07/23/2012  . Hyperlipidemia 07/23/2012  . Controlled type 2 DM with peripheral circulatory disorder (HCC) 07/23/2012  . GERD (gastroesophageal reflux disease) 06/29/2011  . HTN (hypertension) 06/29/2011  . Intertrochanteric fracture of left femur (HCC) 06/29/2011  . Arthritis 06/29/2011  . Hypothyroid 06/29/2011    Norton Pastel, OTR/L 01/16/2016, 5:01 PM  [facility] [address] [place], [place], [address] Phone: [phone]   Fax:  [phone]  Name: Jessica Hawkins MRN: [id number] Date of Birth: Jan 25, 1923

## 2016-01-16 NOTE — Patient Instructions (Signed)
  Coordination Activities  Perform the following activities for 15-20 minutes 1-2 times per day with left hand(s).   Rotate ball in fingertips (clockwise and counter-clockwise).  Toss ball between hands.  Toss ball in air and catch with the same hand.  Flip cards 1 at a time as fast as you can.  Pick up coins and place in container or coin bank. - start with pennies.  As this gets easier you can switch to dimes.  Screw together nuts and bolts, then unfasten.  Use your left hand to turn the screw or bolt and your right hand to hold the other piece.

## 2016-01-18 ENCOUNTER — Encounter: Payer: Self-pay | Admitting: Occupational Therapy

## 2016-01-18 ENCOUNTER — Encounter: Payer: Self-pay | Admitting: Internal Medicine

## 2016-01-18 ENCOUNTER — Ambulatory Visit: Payer: Medicare Other | Admitting: Occupational Therapy

## 2016-01-18 DIAGNOSIS — M6281 Muscle weakness (generalized): Secondary | ICD-10-CM

## 2016-01-18 DIAGNOSIS — R2689 Other abnormalities of gait and mobility: Secondary | ICD-10-CM

## 2016-01-18 DIAGNOSIS — R278 Other lack of coordination: Secondary | ICD-10-CM

## 2016-01-18 NOTE — Therapy (Signed)
[facility] [address] [place], [place], [address] Phone: [phone]   Fax:  [phone]  Occupational Therapy Treatment  Patient Details  Name: Jessica Hawkins MRN: [id number] Date of Birth: 1922/10/27 Referring Provider: Dr Jeanmarie Hubert  Encounter Date: 01/18/2016      OT End of Session - 01/18/16 1700    Visit Number 7   Number of Visits 12   Date for OT Re-Evaluation 01/25/16   Authorization Type Medicare Part A & Medicare Part B   Authorization Time Period G code every 10th visit   Authorization - Visit Number 7   Authorization - Number of Visits 10   OT Start Time 1447   OT Stop Time 1529   OT Time Calculation (min) 42 min   Activity Tolerance Patient tolerated treatment well      Past Medical History:  Diagnosis Date  . Acute blood loss anemia   . Anginal pain [facility])    'i haven't had them in a long time but i used to'  . Arthritis    Osteoarthritis  . BBB (bundle branch block)    RT  . Coronary atherosclerosis of unspecified type of vessel, native or graft   . Debility, unspecified   . Diabetes mellitus without complication ([place])    Diet controlled  . Diaphragmatic hernia without mention of obstruction or gangrene   . Disturbance of skin sensation   . Edema   . Fibrocystic breast   . Gait disorder   . GERD (gastroesophageal reflux disease) 06/29/2011  . Hiatal hernia   . History of transfusion of packed red blood cells   . Hyperlipidemia   . Hypertension 06/29/11  . Hypothyroid 06/29/2011  . Insomnia   . OAB (overactive bladder)   . Osteoporosis   . Platelet disorder ([place])    ? Von Willebrand; abnormal PLT function assay 07/04/11   . Unspecified constipation   . Unspecified sleep apnea   . Unspecified urinary incontinence   . Unspecified vitamin D deficiency   . Unsteady gait   . Vertigo     Past Surgical History:  Procedure Laterality Date  . EYE SURGERY Right 2005   Cataract  surgery  . EYE SURGERY Left 2006   Cataract surgery  . FEMUR IM NAIL  06/29/2011   Procedure: INTRAMEDULLARY (IM) NAIL FEMORAL;  Surgeon: Augustin Schooling, MD;  Location: [facility];  Service: Orthopedics;  Laterality: Left;  . HIP SURGERY Right 2002   ORIF  . ROTATOR CUFF REPAIR Left 1999   Tear  . THYROIDECTOMY  1952  . TONSILLECTOMY    . TOTAL KNEE ARTHROPLASTY Left 05/29/2015   Procedure: TOTAL KNEE ARTHROPLASTY;  Surgeon: Vickey Huger, MD;  Location: [facility];  Service: Orthopedics;  Laterality: Left;    There were no vitals filed for this visit.      Subjective Assessment - 01/18/16 1453    Subjective  I slipped off my bed last week. I didn't get hurt but I couldn't get up off the floor.    Pertinent History see epic   Patient Stated Goals Improve balance    Currently in Pain? Yes   Pain Score 1   goes up to 2-3 when I walk or stand   Pain Location Leg   Pain Orientation Left   Pain Descriptors / Indicators Sore   Pain Type Chronic pain   Pain Frequency Intermittent   Aggravating Factors  walking, putting weight on it   Pain  Relieving Factors rest, aleve                      OT Treatments/Exercises (OP) - 01/18/16 0001      ADLs   Functional Mobility Focus today on walker safety with simple activities in the kitchen as well as negotating safely in envrionment.  Pt demonstrated good safety with simple activities.  Recommended that pt should not be doing any cooking without close supervision to min a due to impaired cognition - pt and family verbalized understanding.  Also focused on endurance with  pt - pt tolerated 15 minutes x2 with one rest break for ambulatory tasks.  Recommended walker basket and bag to increase independence for pt to carry items. Pt and family verbalized understanding.                   OT Short Term Goals - 01/18/16 1659      OT SHORT TERM GOAL #1   Title Pt/caregiver to verbalize understanding with memory strategies   Baseline  Consistent/Mod vc's and repetetion for new information;    Time 3   Period Weeks   Status Achieved  revised     OT SHORT TERM GOAL #2   Title Pt/family will verbalize understanding with safety strategies in house as well as for simple meal prep    Baseline Currently having meals prepared by caregiver/family   Time 3   Period Weeks   Status Achieved  and ongoing     OT SHORT TERM GOAL #3   Title Pt will be supervision for LB dressing and ADL's w/ a/e PRN   Baseline Supervisoin LB ADL's except max assist for compression hose   Time 3   Period Weeks   Status Achieved           OT Long Term Goals - 01/18/16 1659      OT LONG TERM GOAL #1   Title Pt will be Mod I-supervision memory strategies    Time 6   Period Weeks   Status Achieved     OT LONG TERM GOAL #2   Title Pt will be supervision level for simple meal prep/snack in ADL kitchen w/o LOB & adhering to safety when reaching for items in cabinets, stove, dishwasher and refridgerator    Time 6   Period Weeks   Status Achieved     OT LONG TERM GOAL #3   Title Pt will be Mod I LB dressing w/ or w/o a/e PRN sit to stand   Time 6   Period Weeks   Status Deferred  see STG     OT LONG TERM GOAL #4   Title Pt/family will verbalize understanding with simple coordination HEP for Lt hand   Time 6   Period Weeks   Status Achieved     OT LONG TERM GOAL #5   Title Pt will be supervsion for simulated tub transfer using DME PRN for safety   Time 6   Period Weeks   Status Achieved  PER CAREGIVER REPORT               Plan - 01/18/16 1659    Clinical Impression Statement Pt has met all LTG' s and is ready for discharge.  Pt and family in agreement.   Rehab Potential Fair   Clinical Impairments Affecting Rehab Potential Age related memory issues; Pt wish to return to living alone; need for R TKR per pt/family report   OT  Frequency 2x / week   OT Duration 6 weeks   OT Treatment/Interventions Self-care/ADL  training;Therapeutic exercise;DME and/or AE instruction;Therapist, nutritional;Therapeutic activities;Patient/family education;Balance training;Cognitive remediation/compensation;Energy conservation;Therapeutic exercises   Plan d/c from OT   Consulted and Agree with Plan of Care Patient;Family member/caregiver   Family Member Consulted Caregiver - Angie, dtr      Patient will benefit from skilled therapeutic intervention in order to improve the following deficits and impairments:  Abnormal gait, Decreased balance, Decreased mobility, Difficulty walking, Decreased cognition, Decreased activity tolerance, Decreased knowledge of use of DME, Decreased safety awareness, Decreased coordination, Impaired perceived functional ability, Decreased knowledge of precautions  Visit Diagnosis: Muscle weakness (generalized)  Other lack of coordination  Other abnormalities of gait and mobility      G-Codes - February 12, 2016 1701    Functional Assessment Tool Used Clinical judgement; 9 hole peg test   Functional Limitation Self care   Self Care Current Status (H5456) At least 1 percent but less than 20 percent impaired, limited or restricted   Self Care Goal Status (Y5638) At least 1 percent but less than 20 percent impaired, limited or restricted   Self Care Discharge Status [phone]) At least 1 percent but less than 20 percent impaired, limited or restricted      Problem List Patient Active Problem List   Diagnosis Date Noted  . Anemia 12/20/2015  . Cellulitis and abscess of leg 12/20/2015  . LLQ abdominal pain 12/06/2015  . Self-care deficit in patient living alone 11/08/2015  . Peripheral nerve contusion 08/15/2015  . Foot drop, left 07/18/2015  . S/P total knee arthroplasty 05/29/2015  . Bleeding disorder ([place]) 05/22/2015  . Primary osteoarthritis of both knees 05/04/2015  . Nonspecific vaginitis 04/11/2015  . Recurrent UTI 03/07/2015  . Joint mouse 03/07/2015  . Right shoulder pain  Feb 12, 2015  . Pre-operative cardiovascular examination 12/07/2014  . Loss of weight 10/13/2013  . Constipation 10/06/2013  . Venous insufficiency (chronic) (peripheral) 02/02/2013  . Knee pain 12/16/2012  . Arthritis of knee, degenerative 10/29/2012  . Edema 07/23/2012  . Abnormality of gait 07/23/2012  . Urinary incontinence 07/23/2012  . Hyperlipidemia 07/23/2012  . Controlled type 2 DM with peripheral circulatory disorder ([place]) 07/23/2012  . GERD (gastroesophageal reflux disease) 06/29/2011  . HTN (hypertension) 06/29/2011  . Intertrochanteric fracture of left femur ([place]) 06/29/2011  . Arthritis 06/29/2011  . Hypothyroid 06/29/2011   OCCUPATIONAL THERAPY DISCHARGE SUMMARY  Visits from Start of Care: 7  Current functional level related to goals / functional outcomes: See above   Remaining deficits: Cognitive deficits, decreased endurance   Education / Equipment: HEP  Plan: Patient agrees to discharge.  Patient goals were met. Patient is being discharged due to meeting the stated rehab goals.  ?????     Quay Burow, OTR/L 02/12/16, Peter Garter PM  [facility] [address] [place], [place], [address] Phone: [phone]   Fax:  [phone]  Name: Jessica Hawkins MRN: [id number] Date of Birth: 1922/06/26

## 2016-01-27 ENCOUNTER — Encounter: Payer: Self-pay | Admitting: Internal Medicine

## 2016-01-29 ENCOUNTER — Other Ambulatory Visit: Payer: Self-pay | Admitting: Orthopedic Surgery

## 2016-01-30 ENCOUNTER — Ambulatory Visit (INDEPENDENT_AMBULATORY_CARE_PROVIDER_SITE_OTHER): Payer: Medicare Other | Admitting: Nurse Practitioner

## 2016-01-30 ENCOUNTER — Encounter: Payer: Self-pay | Admitting: Nurse Practitioner

## 2016-01-30 VITALS — BP 132/68 | HR 65 | Temp 97.9°F | Resp 18 | Ht <= 58 in | Wt 141.8 lb

## 2016-01-30 DIAGNOSIS — L03116 Cellulitis of left lower limb: Secondary | ICD-10-CM

## 2016-01-30 MED ORDER — DOXYCYCLINE HYCLATE 100 MG PO TABS
100.0000 mg | ORAL_TABLET | Freq: Two times a day (BID) | ORAL | 0 refills | Status: DC
Start: 2016-01-30 — End: 2016-01-31

## 2016-01-30 NOTE — Telephone Encounter (Signed)
Spoke with Pam today, told her Dr. Chilton SiGreen is on vacation, I wanted her to know that Dr. Chilton SiGreen wouldn't be able to call until after 02/08/16. Told Pam that Dr. Chilton SiGreen would need to see Mrs. Borner, we would have to do memory test before he could Rx medication.  She has appt with Dr. Chilton SiGreen 03/19/16 and we can take care of this then. Pam was ok with this.

## 2016-01-30 NOTE — Progress Notes (Signed)
Careteam: Patient Care Team: Kimber RelicArthur G Green, MD as PCP - General (Internal Medicine) Vida RiggerMarc Magod, MD as Consulting Physician (Gastroenterology) Marcine MatarStephen Dahlstedt, MD as Consulting Physician (Urology) Dominica SeverinWilliam Gramig, MD as Consulting Physician (Orthopedic Surgery) Janalyn HarderStuart Tafeen, MD as Consulting Physician (Dermatology) Teryl LucyJoshua Landau, MD as Consulting Physician (Orthopedic Surgery)  Advanced Directive information Does Patient Have a Medical Advance Directive?: Yes, Type of Advance Directive: Healthcare Power of [place];Living will  No Known Allergies  Chief Complaint  Patient presents with  . Acute Visit    Cellutitis of left leg. Became inflamed yesterday.      HPI: Patient is a [age] y.o. female seen in the office today due to left leg swelling, heat and pain that started acutely yesterday. No injury noted. Feeling poorly. Increase fatigue. No fever that they are aware of. No fever today in office.  Had episode of cellulitis earlier this month, resolved with doxycycline.  Review of Systems:  Review of Systems  Constitutional: Positive for activity change. Negative for appetite change, chills, fatigue, fever and unexpected weight change.  Respiratory: Negative for cough, shortness of breath and wheezing.   Cardiovascular: Positive for leg swelling. Negative for chest pain and palpitations.  Gastrointestinal: Negative for abdominal distention, diarrhea and vomiting.       Incontinent of stool and urine sometimes  Skin: Negative.   Neurological: Positive for weakness (Left leg) and numbness (Left foot).       Left foot drop.  Hematological: Bruises/bleeds easily.    Past Medical History:  Diagnosis Date  . Acute blood loss anemia   . Anginal pain [facility])    'i haven't had them in a long time but i used to'  . Arthritis    Osteoarthritis  . BBB (bundle branch block)    RT  . Coronary atherosclerosis of unspecified type of vessel, native or graft   . Debility, unspecified     . Diabetes mellitus without complication ([facility])    Diet controlled  . Diaphragmatic hernia without mention of obstruction or gangrene   . Disturbance of skin sensation   . Edema   . Fibrocystic breast   . Gait disorder   . GERD (gastroesophageal reflux disease) 06/29/2011  . Hiatal hernia   . History of transfusion of packed red blood cells   . Hyperlipidemia   . Hypertension 06/29/11  . Hypothyroid 06/29/2011  . Insomnia   . OAB (overactive bladder)   . Osteoporosis   . Platelet disorder ([facility])    ? Von Willebrand; abnormal PLT function assay 07/04/11   . Unspecified constipation   . Unspecified sleep apnea   . Unspecified urinary incontinence   . Unspecified vitamin D deficiency   . Unsteady gait   . Vertigo    Past Surgical History:  Procedure Laterality Date  . EYE SURGERY Right 2005   Cataract surgery  . EYE SURGERY Left 2006   Cataract surgery  . FEMUR IM NAIL  06/29/2011   Procedure: INTRAMEDULLARY (IM) NAIL FEMORAL;  Surgeon: Verlee RossettiSteven R Norris, MD;  [facility];  Service: Orthopedics;  Laterality: Left;  . HIP SURGERY Right 2002   ORIF  . ROTATOR CUFF REPAIR Left 1999   Tear  . THYROIDECTOMY  1952  . TONSILLECTOMY    . TOTAL KNEE ARTHROPLASTY Left 05/29/2015   Procedure: TOTAL KNEE ARTHROPLASTY;  Surgeon: Dannielle HuhSteve Lucey, MD;  Location: [facility] OR;  Service: Orthopedics;  Laterality: Left;   Social History:   reports that she has never smoked. She  has never used smokeless tobacco. She reports that she does not drink alcohol or use drugs.  Family History  Problem Relation Age of Onset  . Cancer Mother     Stomach  . Cancer Father     Prostate  . Emphysema Father   . Alcohol abuse Sister   . Liver disease Sister   . Kidney disease Brother   . Hypertension Daughter   . Cancer Brother     Prostate, Bladder  . Emphysema Brother   . Cancer Brother     Prostate  . Arthritis Daughter     Knees  . Heart murmur Son   . Mental illness Son     Autism     Medications: Patient's Medications  New Prescriptions   No medications on file  Previous Medications   ASPIRIN EC 81 MG TABLET    Take 81 mg by mouth daily.   BAYER CONTOUR TEST TEST STRIP    TEST once daily   BAYER MICROLET LANCETS LANCETS    Use as instructed to check blood sugar once daily.250.00   FERROUS SULFATE 325 (65 FE) MG TABLET    Take 325 mg by mouth daily.    FUROSEMIDE (LASIX) 40 MG TABLET    Take one tablet by mouth once daily for edema   LEVOTHYROXINE (SYNTHROID, LEVOTHROID) 25 MCG TABLET    Take one tablet by mouth once daily for thyroid supplement   MULTIPLE VITAMIN (MULTIVITAMIN WITH MINERALS) TABS TABLET    Take 1 tablet by mouth daily. Centrum  Modified Medications   No medications on file  Discontinued Medications   No medications on file     Physical Exam:  Vitals:   01/30/16 1141  BP: 132/68  Pulse: 65  Resp: 18  Temp: 97.9 F (36.6 C)  TempSrc: Oral  SpO2: 95%  Weight: 141 lb 12.8 oz (64.3 kg)  Height: 4\' 10"  (1.473 m)   Body mass index is 29.64 kg/m.  Physical Exam  Constitutional: She is oriented to person, place, and time. She appears well-developed and well-nourished. No distress.  frail  HENT:  Head: Normocephalic and atraumatic.  Eyes: Conjunctivae and EOM are normal. Pupils are equal, round, and reactive to light.  Neck: Normal range of motion. Neck supple.  Cardiovascular: Normal rate, regular rhythm, normal heart sounds and intact distal pulses.   Varicose veins.  Pulmonary/Chest: Effort normal and breath sounds normal. No respiratory distress. She has no wheezes. She has no rales.  Abdominal: Soft. Bowel sounds are normal.  Musculoskeletal: She exhibits edema (3+ to left with redness to knee). She exhibits no tenderness.  Left foot drop. Significant weakness with attempts at dorsiflexion. Negative homans sign   Neurological: She is alert and oriented to person, place, and time. Coordination normal.  Decreased sensation left  foot. Left foot drop and weakness with dorsiflexion.  Skin: Skin is warm and dry.  erythema of the left leg to knee    Labs reviewed: Basic Metabolic Panel:  Recent Labs  09/81/19 0913 10/23/15 0245 12/20/15 1018 01/01/16 0013  NA 140 136 134* 137  K 4.7 4.2 5.3 3.9  CL 101 102 98 100*  CO2 24 26 28   --   GLUCOSE 109* 115* 142* 119*  BUN 30 38* 45* 42*  CREATININE 0.91 1.12* 1.47* 1.30*  CALCIUM 9.2 9.2 9.6  --   TSH 1.030  --   --   --    Liver Function Tests:  Recent Labs  07/21/15 0913 10/23/15  0408 12/20/15 1018  AST 16 26 28   ALT 8 14 16   ALKPHOS 89 75 85  BILITOT 0.3 0.4 0.7  PROT 7.2 6.7 7.4  ALBUMIN 3.8 3.7 3.8    Recent Labs  10/23/15 0408  LIPASE 25   No results for input(s): AMMONIA in the last 8760 hours. CBC:  Recent Labs  05/19/15 1555 05/22/15 1559  05/31/15 0415  06/01/15 0338 06/05/15 10/23/15 0245 01/01/16 0013  WBC 4.8 4.4  < > 8.8  < > 6.7 5.3 6.4  --   NEUTROABS 2.2 2.4  --   --   --   --  3  --   --   HGB 11.7* 12.0  < > 9.7*  --  8.4* 7.8* 11.2* 10.5*  HCT 37.5 36.8  < > 29.5*  --  26.4* 25* 36.4 31.0*  MCV 84.5 83.6  < > 83.1  --  82.8  --  85.4  --   PLT 183 193  < > 134*  --  136* 238 176  --   < > = values in this interval not displayed. Lipid Panel: No results for input(s): CHOL, HDL, LDLCALC, TRIG, CHOLHDL, LDLDIRECT in the last 8760 hours. TSH:  Recent Labs  07/21/15 0913  TSH 1.030   A1C: Lab Results  Component Value Date   HGBA1C 5.9 (H) 12/20/2015     Assessment/Plan 1. Cellulitis of left lower extremity - doxycycline (VIBRA-TABS) 100 MG tablet; Take 1 tablet (100 mg total) by mouth 2 (two) times daily.  Dispense: 28 tablet; Refill: 0 To use florastor while on antibiotic -to follow up/seek medial attention if worsening while on antibiotic or if not improving after 2-3 days   Terrye Dombrosky K. Biagio BorgEubanks, AGNP  [facility] [phone](Monday-Friday 8 am - 5 pm) [phone] (after  hours)

## 2016-01-30 NOTE — Patient Instructions (Addendum)
Doxycyline 100 mg by mouth twice daily for 14 days To week medical attention if getting worse while on antibiotic  or no improvement after a few days (2-3 days) on antibiotics  To take florastor (probiotice with antibiotic)     Cellulitis, Adult Introduction Cellulitis is a skin infection. The infected area is usually red and sore. This condition occurs most often in the arms and lower legs. It is very important to get treated for this condition. Follow these instructions at home:  Take over-the-counter and prescription medicines only as told by your doctor.  If you were prescribed an antibiotic medicine, take it as told by your doctor. Do not stop taking the antibiotic even if you start to feel better.  Drink enough fluid to keep your pee (urine) clear or pale yellow.  Do not touch or rub the infected area.  Raise (elevate) the infected area above the level of your heart while you are sitting or lying down.  Place warm or cold wet cloths (warm or cold compresses) on the infected area. Do this as told by your doctor.  Keep all follow-up visits as told by your doctor. This is important. These visits let your doctor make sure your infection is not getting worse. Contact a doctor if:  You have a fever.  Your symptoms do not get better after 1-2 days of treatment.  Your bone or joint under the infected area starts to hurt after the skin has healed.  Your infection comes back. This can happen in the same area or another area.  You have a swollen bump in the infected area.  You have new symptoms.  You feel ill and also have muscle aches and pains. Get help right away if:  Your symptoms get worse.  You feel very sleepy.  You throw up (vomit) or have watery poop (diarrhea) for a long time.  There are red streaks coming from the infected area.  Your red area gets larger.  Your red area turns darker. This information is not intended to replace advice given to you by your  health care provider. Make sure you discuss any questions you have with your health care provider. Document Released: 08/07/2007 Document Revised: 07/27/2015 Document Reviewed: 12/28/2014  2017 Elsevier

## 2016-01-31 ENCOUNTER — Inpatient Hospital Stay (HOSPITAL_COMMUNITY)
Admission: EM | Admit: 2016-01-31 | Discharge: 2016-02-08 | DRG: 603 | Disposition: A | Discharge status: Skilled Nursing Facility | Payer: Medicare Other | Attending: Internal Medicine | Admitting: Internal Medicine

## 2016-01-31 ENCOUNTER — Ambulatory Visit (INDEPENDENT_AMBULATORY_CARE_PROVIDER_SITE_OTHER)
Admission: EM | Admit: 2016-01-31 | Discharge: 2016-01-31 | Disposition: A | Discharge status: Nursing Home (Includes ICF) | Payer: Medicare Other | Source: Home / Self Care | Attending: Family Medicine | Admitting: Family Medicine

## 2016-01-31 ENCOUNTER — Telehealth: Payer: Self-pay | Admitting: *Deleted

## 2016-01-31 ENCOUNTER — Emergency Department (HOSPITAL_COMMUNITY): Payer: Medicare Other

## 2016-01-31 ENCOUNTER — Encounter (HOSPITAL_COMMUNITY): Payer: Self-pay

## 2016-01-31 ENCOUNTER — Encounter (HOSPITAL_COMMUNITY): Payer: Self-pay | Admitting: Emergency Medicine

## 2016-01-31 DIAGNOSIS — I872 Venous insufficiency (chronic) (peripheral): Secondary | ICD-10-CM | POA: Diagnosis present

## 2016-01-31 DIAGNOSIS — M810 Age-related osteoporosis without current pathological fracture: Secondary | ICD-10-CM | POA: Diagnosis present

## 2016-01-31 DIAGNOSIS — Z841 Family history of disorders of kidney and ureter: Secondary | ICD-10-CM | POA: Diagnosis not present

## 2016-01-31 DIAGNOSIS — G4730 Sleep apnea, unspecified: Secondary | ICD-10-CM | POA: Diagnosis present

## 2016-01-31 DIAGNOSIS — L03119 Cellulitis of unspecified part of limb: Secondary | ICD-10-CM | POA: Diagnosis present

## 2016-01-31 DIAGNOSIS — E1122 Type 2 diabetes mellitus with diabetic chronic kidney disease: Secondary | ICD-10-CM | POA: Diagnosis present

## 2016-01-31 DIAGNOSIS — N189 Chronic kidney disease, unspecified: Secondary | ICD-10-CM | POA: Diagnosis not present

## 2016-01-31 DIAGNOSIS — R279 Unspecified lack of coordination: Secondary | ICD-10-CM | POA: Diagnosis not present

## 2016-01-31 DIAGNOSIS — N179 Acute kidney failure, unspecified: Secondary | ICD-10-CM | POA: Diagnosis not present

## 2016-01-31 DIAGNOSIS — Z811 Family history of alcohol abuse and dependence: Secondary | ICD-10-CM | POA: Diagnosis not present

## 2016-01-31 DIAGNOSIS — M170 Bilateral primary osteoarthritis of knee: Secondary | ICD-10-CM | POA: Diagnosis present

## 2016-01-31 DIAGNOSIS — K219 Gastro-esophageal reflux disease without esophagitis: Secondary | ICD-10-CM | POA: Diagnosis present

## 2016-01-31 DIAGNOSIS — N3281 Overactive bladder: Secondary | ICD-10-CM | POA: Diagnosis present

## 2016-01-31 DIAGNOSIS — Z825 Family history of asthma and other chronic lower respiratory diseases: Secondary | ICD-10-CM

## 2016-01-31 DIAGNOSIS — Z7982 Long term (current) use of aspirin: Secondary | ICD-10-CM

## 2016-01-31 DIAGNOSIS — L03116 Cellulitis of left lower limb: Principal | ICD-10-CM | POA: Diagnosis present

## 2016-01-31 DIAGNOSIS — E785 Hyperlipidemia, unspecified: Secondary | ICD-10-CM | POA: Diagnosis present

## 2016-01-31 DIAGNOSIS — I2510 Atherosclerotic heart disease of native coronary artery without angina pectoris: Secondary | ICD-10-CM | POA: Diagnosis present

## 2016-01-31 DIAGNOSIS — M79609 Pain in unspecified limb: Secondary | ICD-10-CM | POA: Diagnosis not present

## 2016-01-31 DIAGNOSIS — M6281 Muscle weakness (generalized): Secondary | ICD-10-CM | POA: Diagnosis not present

## 2016-01-31 DIAGNOSIS — I129 Hypertensive chronic kidney disease with stage 1 through stage 4 chronic kidney disease, or unspecified chronic kidney disease: Secondary | ICD-10-CM | POA: Diagnosis present

## 2016-01-31 DIAGNOSIS — E039 Hypothyroidism, unspecified: Secondary | ICD-10-CM | POA: Diagnosis present

## 2016-01-31 DIAGNOSIS — M1990 Unspecified osteoarthritis, unspecified site: Secondary | ICD-10-CM | POA: Diagnosis present

## 2016-01-31 DIAGNOSIS — Z8249 Family history of ischemic heart disease and other diseases of the circulatory system: Secondary | ICD-10-CM

## 2016-01-31 DIAGNOSIS — M25569 Pain in unspecified knee: Secondary | ICD-10-CM

## 2016-01-31 DIAGNOSIS — Z96659 Presence of unspecified artificial knee joint: Secondary | ICD-10-CM

## 2016-01-31 DIAGNOSIS — E1151 Type 2 diabetes mellitus with diabetic peripheral angiopathy without gangrene: Secondary | ICD-10-CM | POA: Diagnosis present

## 2016-01-31 DIAGNOSIS — N183 Chronic kidney disease, stage 3 (moderate): Secondary | ICD-10-CM | POA: Diagnosis present

## 2016-01-31 DIAGNOSIS — R2689 Other abnormalities of gait and mobility: Secondary | ICD-10-CM | POA: Diagnosis not present

## 2016-01-31 DIAGNOSIS — R41841 Cognitive communication deficit: Secondary | ICD-10-CM | POA: Diagnosis not present

## 2016-01-31 DIAGNOSIS — M7989 Other specified soft tissue disorders: Secondary | ICD-10-CM | POA: Diagnosis not present

## 2016-01-31 DIAGNOSIS — D631 Anemia in chronic kidney disease: Secondary | ICD-10-CM | POA: Diagnosis present

## 2016-01-31 DIAGNOSIS — R5381 Other malaise: Secondary | ICD-10-CM | POA: Diagnosis not present

## 2016-01-31 DIAGNOSIS — Z96652 Presence of left artificial knee joint: Secondary | ICD-10-CM | POA: Diagnosis present

## 2016-01-31 DIAGNOSIS — R600 Localized edema: Secondary | ICD-10-CM | POA: Diagnosis not present

## 2016-01-31 DIAGNOSIS — I10 Essential (primary) hypertension: Secondary | ICD-10-CM | POA: Diagnosis present

## 2016-01-31 DIAGNOSIS — L0390 Cellulitis, unspecified: Secondary | ICD-10-CM

## 2016-01-31 LAB — COMPREHENSIVE METABOLIC PANEL
ALBUMIN: 2.9 g/dL — AB (ref 3.5–5.0)
ALT: 16 U/L (ref 14–54)
ANION GAP: 9 (ref 5–15)
AST: 31 U/L (ref 15–41)
Alkaline Phosphatase: 64 U/L (ref 38–126)
BUN: 36 mg/dL — ABNORMAL HIGH (ref 6–20)
CO2: 26 mmol/L (ref 22–32)
Calcium: 9 mg/dL (ref 8.9–10.3)
Chloride: 99 mmol/L — ABNORMAL LOW (ref 101–111)
Creatinine, Ser: 1.27 mg/dL — ABNORMAL HIGH (ref 0.44–1.00)
GFR calc Af Amer: 41 mL/min — ABNORMAL LOW (ref >60)
GFR calc non Af Amer: 36 mL/min — ABNORMAL LOW (ref >60)
GLUCOSE: 109 mg/dL — AB (ref 65–99)
POTASSIUM: 4.1 mmol/L (ref 3.5–5.1)
SODIUM: 134 mmol/L — AB (ref 135–145)
TOTAL PROTEIN: 6.8 g/dL (ref 6.5–8.1)
Total Bilirubin: 0.9 mg/dL (ref 0.3–1.2)

## 2016-01-31 LAB — CBC WITH DIFFERENTIAL/PLATELET
BASOS PCT: 0 %
Basophils Absolute: 0 10*3/uL (ref 0.0–0.1)
Eosinophils Absolute: 0 10*3/uL (ref 0.0–0.7)
Eosinophils Relative: 0 %
HEMATOCRIT: 31.2 % — AB (ref 36.0–46.0)
HEMOGLOBIN: 10 g/dL — AB (ref 12.0–15.0)
LYMPHS ABS: 0.9 10*3/uL (ref 0.7–4.0)
Lymphocytes Relative: 9 %
MCH: 26.5 pg (ref 26.0–34.0)
MCHC: 32.1 g/dL (ref 30.0–36.0)
MCV: 82.8 fL (ref 78.0–100.0)
MONOS PCT: 5 %
Monocytes Absolute: 0.5 10*3/uL (ref 0.1–1.0)
NEUTROS ABS: 8 10*3/uL — AB (ref 1.7–7.7)
NEUTROS PCT: 86 %
Platelets: 151 10*3/uL (ref 150–400)
RBC: 3.77 MIL/uL — ABNORMAL LOW (ref 3.87–5.11)
RDW: 16 % — ABNORMAL HIGH (ref 11.5–15.5)
WBC: 9.4 10*3/uL (ref 4.0–10.5)

## 2016-01-31 LAB — I-STAT CG4 LACTIC ACID, ED
LACTIC ACID, VENOUS: 1.12 mmol/L (ref 0.5–1.9)
Lactic Acid, Venous: 1.28 mmol/L (ref 0.5–1.9)

## 2016-01-31 LAB — CBG MONITORING, ED: Glucose-Capillary: 101 mg/dL — ABNORMAL HIGH (ref 65–99)

## 2016-01-31 MED ORDER — CLINDAMYCIN PHOSPHATE 900 MG/50ML IV SOLN
900.0000 mg | Freq: Once | INTRAVENOUS | Status: AC
  Administered 2016-01-31: 900 mg via INTRAVENOUS
  Filled 2016-01-31: qty 50

## 2016-01-31 MED ORDER — FERROUS SULFATE 325 (65 FE) MG PO TABS
325.0000 mg | ORAL_TABLET | Freq: Every day | ORAL | Status: DC
Start: 2016-02-01 — End: 2016-02-08
  Administered 2016-02-01 – 2016-02-08 (×8): 325 mg via ORAL
  Filled 2016-01-31 (×8): qty 1

## 2016-01-31 MED ORDER — CEFAZOLIN IN D5W 1 GM/50ML IV SOLN
1.0000 g | Freq: Two times a day (BID) | INTRAVENOUS | Status: DC
  Administered 2016-01-31 – 2016-02-03 (×6): 1 g via INTRAVENOUS
  Filled 2016-01-31 (×7): qty 50

## 2016-01-31 MED ORDER — CEFAZOLIN IN D5W 1 GM/50ML IV SOLN
1.0000 g | Freq: Three times a day (TID) | INTRAVENOUS | Status: DC
  Filled 2016-01-31 (×2): qty 50

## 2016-01-31 MED ORDER — ENOXAPARIN SODIUM 40 MG/0.4ML ~~LOC~~ SOLN
40.0000 mg | SUBCUTANEOUS | Status: DC
  Administered 2016-01-31 – 2016-02-01 (×2): 40 mg via SUBCUTANEOUS
  Filled 2016-01-31 (×2): qty 0.4

## 2016-01-31 MED ORDER — CLINDAMYCIN PHOSPHATE 600 MG/50ML IV SOLN: 600.0000 mg | Freq: Three times a day (TID) | INTRAVENOUS | Status: DC

## 2016-01-31 MED ORDER — LEVOTHYROXINE SODIUM 25 MCG PO TABS
25.0000 ug | ORAL_TABLET | Freq: Every day | ORAL | Status: DC
  Administered 2016-02-01 – 2016-02-08 (×8): 25 ug via ORAL
  Filled 2016-01-31 (×9): qty 1

## 2016-01-31 MED ORDER — ASPIRIN EC 81 MG PO TBEC
81.0000 mg | DELAYED_RELEASE_TABLET | Freq: Every day | ORAL | Status: DC
  Administered 2016-02-01 – 2016-02-08 (×8): 81 mg via ORAL
  Filled 2016-01-31 (×8): qty 1

## 2016-01-31 MED ORDER — FUROSEMIDE 40 MG PO TABS
40.0000 mg | ORAL_TABLET | Freq: Every day | ORAL | Status: DC
Start: 2016-02-01 — End: 2016-02-02
  Administered 2016-02-01 – 2016-02-02 (×2): 40 mg via ORAL
  Filled 2016-01-31 (×2): qty 1

## 2016-01-31 NOTE — ED Triage Notes (Signed)
Patient sent from urgent care for further evaluation of left lower leg swelling and redness. Patient states that she has had this before that resolved and now returned 1 week ago, minor weeping with same, denies fever

## 2016-01-31 NOTE — Telephone Encounter (Signed)
[name], daughter called this morning and stated that patient's cellulitis is spreading up her leg. Has worsened. Patient is taking the antibiotic prescribed. Daughter stated that she is going to take patient to an Urgent Care. I checked our availability but no available appointments with Dr. Montez Moritaarter. Reviewed Jessica's OV note from yesterday and agreed with daughter to take to Urgent Care.

## 2016-01-31 NOTE — ED Notes (Signed)
Pt's caregiver, Aleen Sellsara Morgan, [phone]

## 2016-01-31 NOTE — H&P (Signed)
History and Physical    Jessica Munchauline G Somers WJX:[id number] DOB: [birth year] DOA: 01/31/2016   PCP: Murray HodgkinsArthur Green, MD Chief Complaint:  Chief Complaint  Patient presents with  . cellulitis to left leg    HPI: Jessica Hawkins is a 82 y.o. female with medical history significant of recent cellulitis of L ankle, treated with course of doxycycline at end of October.  Had resolved but then reoccurred recently, just started again on doxycycline yesterday without improvement.  No fever, chills, or systemic symptoms.  Cellulitis is persistent.  Nothing makes better or worse.  ED Course: Given dose of clindamycin IV and hospitalist asked to admit as failed outpatient.  Review of Systems: As per HPI otherwise 10 point review of systems negative.    Past Medical History:  Diagnosis Date  . Acute blood loss anemia   . Anginal pain [facility](HCC)    'i haven't had them in a long time but i used to'  . Arthritis    Osteoarthritis  . BBB (bundle branch block)    RT  . Coronary atherosclerosis of unspecified type of vessel, native or graft   . Debility, unspecified   . Diabetes mellitus without complication (HCC)    Diet controlled  . Diaphragmatic hernia without mention of obstruction or gangrene   . Disturbance of skin sensation   . Edema   . Fibrocystic breast   . Gait disorder   . GERD (gastroesophageal reflux disease) 06/29/2011  . Hiatal hernia   . History of transfusion of packed red blood cells   . Hyperlipidemia   . Hypertension 06/29/11  . Hypothyroid 06/29/2011  . Insomnia   . OAB (overactive bladder)   . Osteoporosis   . Platelet disorder (HCC)    ? Von Willebrand; abnormal PLT function assay 07/04/11   . Unspecified constipation   . Unspecified sleep apnea   . Unspecified urinary incontinence   . Unspecified vitamin D deficiency   . Unsteady gait   . Vertigo     Past Surgical History:  Procedure Laterality Date  . EYE SURGERY Right 2005   Cataract surgery  . EYE SURGERY Left 2006    Cataract surgery  . FEMUR IM NAIL  06/29/2011   Procedure: INTRAMEDULLARY (IM) NAIL FEMORAL;  Surgeon: Verlee RossettiSteven R Norris, MD;  [facility];  Service: Orthopedics;  Laterality: Left;  . HIP SURGERY Right 2002   ORIF  . ROTATOR CUFF REPAIR Left 1999   Tear  . THYROIDECTOMY  1952  . TONSILLECTOMY    . TOTAL KNEE ARTHROPLASTY Left 05/29/2015   Procedure: TOTAL KNEE ARTHROPLASTY;  Surgeon: Dannielle HuhSteve Lucey, MD;  Location: [facility] OR;  Service: Orthopedics;  Laterality: Left;     reports that she has never smoked. She has never used smokeless tobacco. She reports that she does not drink alcohol or use drugs.  No Known Allergies  Family History  Problem Relation Age of Onset  . Cancer Mother     Stomach  . Cancer Father     Prostate  . Emphysema Father   . Alcohol abuse Sister   . Liver disease Sister   . Kidney disease Brother   . Hypertension Daughter   . Cancer Brother     Prostate, Bladder  . Emphysema Brother   . Cancer Brother     Prostate  . Arthritis Daughter     Knees  . Heart murmur Son   . Mental illness Son     Autism  Prior to Admission medications   Medication Sig Start Date End Date Taking? Authorizing Provider  aspirin EC 81 MG tablet Take 81 mg by mouth daily.   Yes Historical Provider, MD  ferrous sulfate 325 (65 FE) MG tablet Take 325 mg by mouth daily.    Yes Historical Provider, MD  furosemide (LASIX) 40 MG tablet Take one tablet by mouth once daily for edema 12/20/15  Yes Kimber Relic, MD  levothyroxine (SYNTHROID, LEVOTHROID) 25 MCG tablet Take one tablet by mouth once daily for thyroid supplement 12/29/15  Yes Kimber Relic, MD  Multiple Vitamin (MULTIVITAMIN WITH MINERALS) TABS tablet Take 1 tablet by mouth daily. Centrum   Yes Historical Provider, MD    Physical Exam: Vitals:   01/31/16 1352 01/31/16 1730  BP: (!) 124/50 140/61  Pulse: 73 80  Resp: 20 16  Temp: 98.9 F (37.2 C)   TempSrc: Oral   SpO2: 99% 100%      Constitutional:  NAD, calm, comfortable Eyes: PERRL, lids and conjunctivae normal ENMT: Mucous membranes are moist. Posterior pharynx clear of any exudate or lesions.Normal dentition.  Neck: normal, supple, no masses, no thyromegaly Respiratory: clear to auscultation bilaterally, no wheezing, no crackles. Normal respiratory effort. No accessory muscle use.  Cardiovascular: Regular rate and rhythm, no murmurs / rubs / gallops. No extremity edema. 2+ pedal pulses. No carotid bruits.  Abdomen: no tenderness, no masses palpated. No hepatosplenomegaly. Bowel sounds positive.  Musculoskeletal: no clubbing / cyanosis. No joint deformity upper and lower extremities. Good ROM, no contractures. Normal muscle tone.  Skin: erythema, edema, tenderness of L lower leg and foot, no skin lesions, no obvious abscess or fluctuance Neurologic: CN 2-12 grossly intact. Sensation intact, DTR normal. Strength 5/5 in all 4.  Psychiatric: Normal judgment and insight. Alert and oriented x 3. Normal mood.    Labs on Admission: I have personally reviewed following labs and imaging studies  CBC:  Recent Labs Lab 01/31/16 1400  WBC 9.4  NEUTROABS 8.0*  HGB 10.0*  HCT 31.2*  MCV 82.8  PLT 151   Basic Metabolic Panel:  Recent Labs Lab 01/31/16 1400  NA 134*  K 4.1  CL 99*  CO2 26  GLUCOSE 109*  BUN 36*  CREATININE 1.27*  CALCIUM 9.0   GFR: Estimated Creatinine Clearance: 22.9 mL/min (by C-G formula based on SCr of 1.27 mg/dL (H)). Liver Function Tests:  Recent Labs Lab 01/31/16 1400  AST 31  ALT 16  ALKPHOS 64  BILITOT 0.9  PROT 6.8  ALBUMIN 2.9*   No results for input(s): LIPASE, AMYLASE in the last 168 hours. No results for input(s): AMMONIA in the last 168 hours. Coagulation Profile: No results for input(s): INR, PROTIME in the last 168 hours. Cardiac Enzymes: No results for input(s): CKTOTAL, CKMB, CKMBINDEX, TROPONINI in the last 168 hours. BNP (last 3 results) No results for input(s): PROBNP in the  last 8760 hours. HbA1C: No results for input(s): HGBA1C in the last 72 hours. CBG:  Recent Labs Lab 01/31/16 1402  GLUCAP 101*   Lipid Profile: No results for input(s): CHOL, HDL, LDLCALC, TRIG, CHOLHDL, LDLDIRECT in the last 72 hours. Thyroid Function Tests: No results for input(s): TSH, T4TOTAL, FREET4, T3FREE, THYROIDAB in the last 72 hours. Anemia Panel: No results for input(s): VITAMINB12, FOLATE, FERRITIN, TIBC, IRON, RETICCTPCT in the last 72 hours. Urine analysis:    Component Value Date/Time   COLORURINE YELLOW 05/19/2015 1554   APPEARANCEUR CLEAR 05/19/2015 1554   LABSPEC 1.018 05/19/2015  1554   PHURINE 7.0 05/19/2015 1554   GLUCOSEU NEGATIVE 05/19/2015 1554   HGBUR TRACE (A) 05/19/2015 1554   BILIRUBINUR NEGATIVE 05/19/2015 1554   BILIRUBINUR Pos 02/24/2015 1151   KETONESUR NEGATIVE 05/19/2015 1554   PROTEINUR 30 (A) 05/19/2015 1554   UROBILINOGEN 0.2 02/24/2015 1151   UROBILINOGEN 1.0 08/25/2012 1929   NITRITE NEGATIVE 05/19/2015 1554   LEUKOCYTESUR NEGATIVE 05/19/2015 1554   Sepsis Labs: @LABRCNTIP (procalcitonin:4,lacticidven:4) )No results found for this or any previous visit (from the past 240 hour(s)).   Radiological Exams on Admission: Dg Tibia/fibula Left  Result Date: 01/31/2016 CLINICAL DATA:  Cellulitis, assess for osteomyelitis EXAM: LEFT TIBIA AND FIBULA - 2 VIEW COMPARISON:  06/29/2011 C-arm views of the left hip and femur FINDINGS: There is diffuse soft tissue swelling consistent with history of cellulitis. Partially visualized distal intra medullary femoral rod and left knee arthroplasty appear intact. No cortical bone destruction nor acute fracture is identified. Vascular phleboliths are seen distally. The bones are slightly osteopenic in appearance ankle mortise is maintained. Calcaneal enthesophytes are noted along the plantar dorsal aspect. IMPRESSION: Soft tissue swelling without acute fracture nor bone destruction. No hardware failure  identified. Calcaneal enthesophytes. Electronically Signed   By: Tollie Ethavid  Kwon M.D.   On: 01/31/2016 18:57    EKG: Independently reviewed.  Assessment/Plan Principal Problem:   Cellulitis of left anterior lower leg    1. Cellulitis of L anterior lower leg - 1. Got 1 dose of clinda in ED 2. Will put patient on ancef   DVT prophylaxis: Lovenox Code Status: Full Family Communication: Daughter at bedside, extensive (1 hr) spent in discussion with family on cellulitis Consults called: None Admission status: Admit to inpatient (failed outpatient therapy)   Hillary BowGARDNER, Benoit Meech M. DO [facility] Pager [phone] from 7PM-7AM  If 7AM-7PM, please contact the day physician for the patient [email or web address] Password [email or web address]  01/31/2016, 8:07 PM

## 2016-01-31 NOTE — ED Notes (Signed)
Patient transported to X-ray 

## 2016-01-31 NOTE — ED Notes (Signed)
[facility]

## 2016-01-31 NOTE — ED Notes (Signed)
Bed: UC02 Expected date:  Expected time:  Means of arrival:  Comments: 

## 2016-01-31 NOTE — ED Provider Notes (Addendum)
[facility]    CSN: [id number] Arrival date & time: 01/31/16  1115     History   Chief Complaint Chief Complaint  Patient presents with  . Cellulitis    HPI Jessica Hawkins is a 82 y.o. female.   The history is provided by the patient.  Leg Pain  Location:  Leg Time since incident:  3 days Injury: no   Leg location:  L lower leg Pain details:    Quality:  Burning   Severity:  Moderate   Onset quality:  Gradual   Duration:  3 days Chronicity:  Recurrent (was on abx early nov but sx recently relapsed. placed on doxy 100mg  on mon but sx worsening.) Dislocation: no   Prior injury to area:  No Relieved by:  Compression Associated symptoms: swelling   Associated symptoms: no fever   Risk factors: recent illness     Past Medical History:  Diagnosis Date  . Acute blood loss anemia   . Anginal pain [facility]HCC)    'i haven't had them in a long time but i used to'  . Arthritis    Osteoarthritis  . BBB (bundle branch block)    RT  . Coronary atherosclerosis of unspecified type of vessel, native or graft   . Debility, unspecified   . Diabetes mellitus without complication (HCC)    Diet controlled  . Diaphragmatic hernia without mention of obstruction or gangrene   . Disturbance of skin sensation   . Edema   . Fibrocystic breast   . Gait disorder   . GERD (gastroesophageal reflux disease) 06/29/2011  . Hiatal hernia   . History of transfusion of packed red blood cells   . Hyperlipidemia   . Hypertension 06/29/11  . Hypothyroid 06/29/2011  . Insomnia   . OAB (overactive bladder)   . Osteoporosis   . Platelet disorder (HCC)    ? Von Willebrand; abnormal PLT function assay 07/04/11   . Unspecified constipation   . Unspecified sleep apnea   . Unspecified urinary incontinence   . Unspecified vitamin D deficiency   . Unsteady gait   . Vertigo     Patient Active Problem List   Diagnosis Date Noted  . Cellulitis of left anterior lower leg 01/31/2016  . Anemia  12/20/2015  . Cellulitis and abscess of leg 12/20/2015  . LLQ abdominal pain 12/06/2015  . Self-care deficit in patient living alone 11/08/2015  . Peripheral nerve contusion 08/15/2015  . Foot drop, left 07/18/2015  . S/P total knee arthroplasty 05/29/2015  . Bleeding disorder (HCC) 05/22/2015  . Primary osteoarthritis of both knees 05/04/2015  . Nonspecific vaginitis 04/11/2015  . Recurrent UTI 03/07/2015  . Joint mouse 03/07/2015  . Right shoulder pain 01/18/2015  . Pre-operative cardiovascular examination 12/07/2014  . Loss of weight 10/13/2013  . Constipation 10/06/2013  . Venous insufficiency (chronic) (peripheral) 02/02/2013  . Knee pain 12/16/2012  . Arthritis of knee, degenerative 10/29/2012  . Edema 07/23/2012  . Abnormality of gait 07/23/2012  . Urinary incontinence 07/23/2012  . Hyperlipidemia 07/23/2012  . Controlled type 2 DM with peripheral circulatory disorder (HCC) 07/23/2012  . GERD (gastroesophageal reflux disease) 06/29/2011  . HTN (hypertension) 06/29/2011  . Intertrochanteric fracture of left femur (HCC) 06/29/2011  . Arthritis 06/29/2011  . Hypothyroid 06/29/2011    Past Surgical History:  Procedure Laterality Date  . EYE SURGERY Right 2005   Cataract surgery  . EYE SURGERY Left 2006   Cataract surgery  . FEMUR IM NAIL  06/29/2011   Procedure: INTRAMEDULLARY (IM) NAIL FEMORAL;  Surgeon: Verlee Rossetti, MD;  Location: [facility] OR;  Service: Orthopedics;  Laterality: Left;  . HIP SURGERY Right 2002   ORIF  . ROTATOR CUFF REPAIR Left 1999   Tear  . THYROIDECTOMY  1952  . TONSILLECTOMY    . TOTAL KNEE ARTHROPLASTY Left 05/29/2015   Procedure: TOTAL KNEE ARTHROPLASTY;  Surgeon: Dannielle Huh, MD;  Location: [facility] OR;  Service: Orthopedics;  Laterality: Left;    OB History    No data available       Home Medications    Prior to Admission medications   Medication Sig Start Date End Date Taking? Authorizing Provider  aspirin EC 81 MG tablet Take 81 mg by  mouth daily.    Historical Provider, MD  ferrous sulfate 325 (65 FE) MG tablet Take 325 mg by mouth daily.     Historical Provider, MD  furosemide (LASIX) 40 MG tablet Take one tablet by mouth once daily for edema 12/20/15   Kimber Relic, MD  levothyroxine (SYNTHROID, LEVOTHROID) 25 MCG tablet Take one tablet by mouth once daily for thyroid supplement 12/29/15   Kimber Relic, MD  Multiple Vitamin (MULTIVITAMIN WITH MINERALS) TABS tablet Take 1 tablet by mouth daily. Centrum    Historical Provider, MD    Family History Family History  Problem Relation Age of Onset  . Cancer Mother     Stomach  . Cancer Father     Prostate  . Emphysema Father   . Alcohol abuse Sister   . Liver disease Sister   . Kidney disease Brother   . Hypertension Daughter   . Cancer Brother     Prostate, Bladder  . Emphysema Brother   . Cancer Brother     Prostate  . Arthritis Daughter     Knees  . Heart murmur Son   . Mental illness Son     Autism    Social History Social History  Substance Use Topics  . Smoking status: Never Smoker  . Smokeless tobacco: Never Used  . Alcohol use No     Allergies   Patient has no known allergies.   Review of Systems Review of Systems  Constitutional: Negative for fever.  Skin: Positive for color change and rash.     Physical Exam Triage Vital Signs ED Triage Vitals  Enc Vitals Group     BP 01/31/16 1213 150/63     Pulse Rate 01/31/16 1213 82     Resp 01/31/16 1213 24     Temp 01/31/16 1213 98.6 F (37 C)     Temp Source 01/31/16 1213 Oral     SpO2 01/31/16 1213 99 %     Weight --      Height --      Head Circumference --      Peak Flow --      Pain Score 01/31/16 1212 10     Pain Loc --      Pain Edu? --      Excl. in GC? --    No data found.   Updated Vital Signs BP 150/63 (BP Location: Right Arm)   Pulse 82   Temp 98.6 F (37 C) (Oral)   Resp 24   SpO2 99%   Visual Acuity Right Eye Distance:   Left Eye Distance:    Bilateral Distance:    Right Eye Near:   Left Eye Near:    Bilateral Near:  Physical Exam  Constitutional: She appears well-developed and well-nourished. She appears distressed.  Musculoskeletal: She exhibits edema and tenderness.  Erythema warmth and edema to left lower leg to knee, pulses 2+.  Neurological: She is alert.  Skin: There is erythema.  Nursing note and vitals reviewed.    UC Treatments / Results  Labs (all labs ordered are listed, but only abnormal results are displayed) Labs Reviewed - No data to display  EKG  EKG Interpretation None       Radiology Dg Tibia/fibula Left  Result Date: 01/31/2016 CLINICAL DATA:  Cellulitis, assess for osteomyelitis EXAM: LEFT TIBIA AND FIBULA - 2 VIEW COMPARISON:  06/29/2011 C-arm views of the left hip and femur FINDINGS: There is diffuse soft tissue swelling consistent with history of cellulitis. Partially visualized distal intra medullary femoral rod and left knee arthroplasty appear intact. No cortical bone destruction nor acute fracture is identified. Vascular phleboliths are seen distally. The bones are slightly osteopenic in appearance ankle mortise is maintained. Calcaneal enthesophytes are noted along the plantar dorsal aspect. IMPRESSION: Soft tissue swelling without acute fracture nor bone destruction. No hardware failure identified. Calcaneal enthesophytes. Electronically Signed   By: Tollie Ethavid  Kwon M.D.   On: 01/31/2016 18:57    Procedures Procedures (including critical care time)  Medications Ordered in UC Medications - No data to display   Initial Impression / Assessment and Plan / UC Course  I have reviewed the triage vital signs and the nursing notes.  Pertinent labs & imaging results that were available during my care of the patient were reviewed by me and considered in my medical decision making (see chart for details).  Clinical Course       Final Clinical Impressions(s) / UC Diagnoses   Final  diagnoses:  Cellulitis of left lower leg    New Prescriptions Discharge Medication List as of 01/31/2016  1:05 PM       Linna HoffJames D Averlee Swartz, MD 01/31/16 1257    Linna HoffJames D Kyel Purk, MD 02/02/16 1008

## 2016-01-31 NOTE — ED Provider Notes (Signed)
[facility]-EMERGENCY DEPT Provider Note   CSN: [id number] Arrival date & time: 01/31/16  1326     History   Chief Complaint Chief Complaint  Patient presents with  . cellulitis to left leg    HPI Jessica Hawkins is a 82 y.o. female.  The history is provided by the patient and a relative.  Rash   This is a recurrent problem. The current episode started more than 2 days ago. The problem has been gradually worsening. The problem is associated with nothing. There has been no fever. The rash is present on the left lower leg. The pain is at a severity of 6/10. The pain is moderate. The pain has been constant since onset. Associated symptoms include pain. Treatments tried: doxicycline. The treatment provided no relief.    Past Medical History:  Diagnosis Date  . Acute blood loss anemia   . Anginal pain [facility](HCC)    'i haven't had them in a long time but i used to'  . Arthritis    Osteoarthritis  . BBB (bundle branch block)    RT  . Coronary atherosclerosis of unspecified type of vessel, native or graft   . Debility, unspecified   . Diabetes mellitus without complication (HCC)    Diet controlled  . Diaphragmatic hernia without mention of obstruction or gangrene   . Disturbance of skin sensation   . Edema   . Fibrocystic breast   . Gait disorder   . GERD (gastroesophageal reflux disease) 06/29/2011  . Hiatal hernia   . History of transfusion of packed red blood cells   . Hyperlipidemia   . Hypertension 06/29/11  . Hypothyroid 06/29/2011  . Insomnia   . OAB (overactive bladder)   . Osteoporosis   . Platelet disorder (HCC)    ? Von Willebrand; abnormal PLT function assay 07/04/11   . Unspecified constipation   . Unspecified sleep apnea   . Unspecified urinary incontinence   . Unspecified vitamin D deficiency   . Unsteady gait   . Vertigo     Patient Active Problem List   Diagnosis Date Noted  . Cellulitis of left anterior lower leg 01/31/2016  . Anemia 12/20/2015  . Cellulitis  and abscess of leg 12/20/2015  . LLQ abdominal pain 12/06/2015  . Self-care deficit in patient living alone 11/08/2015  . Peripheral nerve contusion 08/15/2015  . Foot drop, left 07/18/2015  . S/P total knee arthroplasty 05/29/2015  . Bleeding disorder (HCC) 05/22/2015  . Primary osteoarthritis of both knees 05/04/2015  . Nonspecific vaginitis 04/11/2015  . Recurrent UTI 03/07/2015  . Joint mouse 03/07/2015  . Right shoulder pain 01/18/2015  . Pre-operative cardiovascular examination 12/07/2014  . Loss of weight 10/13/2013  . Constipation 10/06/2013  . Venous insufficiency (chronic) (peripheral) 02/02/2013  . Knee pain 12/16/2012  . Arthritis of knee, degenerative 10/29/2012  . Edema 07/23/2012  . Abnormality of gait 07/23/2012  . Urinary incontinence 07/23/2012  . Hyperlipidemia 07/23/2012  . Controlled type 2 DM with peripheral circulatory disorder (HCC) 07/23/2012  . GERD (gastroesophageal reflux disease) 06/29/2011  . HTN (hypertension) 06/29/2011  . Intertrochanteric fracture of left femur (HCC) 06/29/2011  . Arthritis 06/29/2011  . Hypothyroid 06/29/2011    Past Surgical History:  Procedure Laterality Date  . EYE SURGERY Right 2005   Cataract surgery  . EYE SURGERY Left 2006   Cataract surgery  . FEMUR IM NAIL  06/29/2011   Procedure: INTRAMEDULLARY (IM) NAIL FEMORAL;  Surgeon: Verlee RossettiSteven R Norris, MD;  Location: [facility] OR;  Service: Orthopedics;  Laterality: Left;  . HIP SURGERY Right 2002   ORIF  . ROTATOR CUFF REPAIR Left 1999   Tear  . THYROIDECTOMY  1952  . TONSILLECTOMY    . TOTAL KNEE ARTHROPLASTY Left 05/29/2015   Procedure: TOTAL KNEE ARTHROPLASTY;  Surgeon: Dannielle Huh, MD;  Location: [facility] OR;  Service: Orthopedics;  Laterality: Left;    OB History    No data available       Home Medications    Prior to Admission medications   Medication Sig Start Date End Date Taking? Authorizing Provider  aspirin EC 81 MG tablet Take 81 mg by mouth daily.   Yes  Historical Provider, MD  ferrous sulfate 325 (65 FE) MG tablet Take 325 mg by mouth daily.    Yes Historical Provider, MD  furosemide (LASIX) 40 MG tablet Take one tablet by mouth once daily for edema 12/20/15  Yes Kimber Relic, MD  levothyroxine (SYNTHROID, LEVOTHROID) 25 MCG tablet Take one tablet by mouth once daily for thyroid supplement 12/29/15  Yes Kimber Relic, MD  Multiple Vitamin (MULTIVITAMIN WITH MINERALS) TABS tablet Take 1 tablet by mouth daily. Centrum   Yes Historical Provider, MD    Family History Family History  Problem Relation Age of Onset  . Cancer Mother     Stomach  . Cancer Father     Prostate  . Emphysema Father   . Alcohol abuse Sister   . Liver disease Sister   . Kidney disease Brother   . Hypertension Daughter   . Cancer Brother     Prostate, Bladder  . Emphysema Brother   . Cancer Brother     Prostate  . Arthritis Daughter     Knees  . Heart murmur Son   . Mental illness Son     Autism    Social History Social History  Substance Use Topics  . Smoking status: Never Smoker  . Smokeless tobacco: Never Used  . Alcohol use No     Allergies   Patient has no known allergies.   Review of Systems Review of Systems  Constitutional: Negative for chills and fever.  HENT: Negative for ear pain and sore throat.   Eyes: Negative for pain and visual disturbance.  Respiratory: Negative for cough and shortness of breath.   Cardiovascular: Positive for leg swelling (Left). Negative for chest pain and palpitations.  Gastrointestinal: Negative for abdominal pain and vomiting.  Genitourinary: Negative for dysuria and hematuria.  Musculoskeletal: Negative for arthralgias and back pain.  Skin: Positive for rash. Negative for color change.  Neurological: Negative for seizures and syncope.  All other systems reviewed and are negative.    Physical Exam Updated Vital Signs BP 129/55   Pulse 78   Temp 98.9 F (37.2 C) (Oral)   Resp 16   Ht (P) 5'  3" (1.6 m)   Wt (P) 63.3 kg   SpO2 95%   BMI (P) 24.71 kg/m   Physical Exam  Constitutional: She appears well-developed and well-nourished. No distress.  HENT:  Head: Normocephalic and atraumatic.  Eyes: Conjunctivae are normal.  Neck: Neck supple.  Cardiovascular: Normal rate and regular rhythm.   No murmur heard. Pulmonary/Chest: Effort normal and breath sounds normal. No respiratory distress.  Abdominal: Soft. There is no tenderness.  Musculoskeletal: She exhibits edema.       Left ankle: She exhibits swelling. Tenderness.       Left lower leg: She exhibits tenderness and swelling.  Neurological: She  is alert.  Skin: Skin is warm and dry. There is erythema.     Psychiatric: She has a normal mood and affect.  Nursing note and vitals reviewed.    ED Treatments / Results  Labs (all labs ordered are listed, but only abnormal results are displayed) Labs Reviewed  COMPREHENSIVE METABOLIC PANEL - Abnormal; Notable for the following:       Result Value   Sodium 134 (*)    Chloride 99 (*)    Glucose, Bld 109 (*)    BUN 36 (*)    Creatinine, Ser 1.27 (*)    Albumin 2.9 (*)    GFR calc non Af Amer 36 (*)    GFR calc Af Amer 41 (*)    All other components within normal limits  CBC WITH DIFFERENTIAL/PLATELET - Abnormal; Notable for the following:    RBC 3.77 (*)    Hemoglobin 10.0 (*)    HCT 31.2 (*)    RDW 16.0 (*)    Neutro Abs 8.0 (*)    All other components within normal limits  CBG MONITORING, ED - Abnormal; Notable for the following:    Glucose-Capillary 101 (*)    All other components within normal limits  CULTURE, BLOOD (ROUTINE X 2)  CULTURE, BLOOD (ROUTINE X 2)  I-STAT CG4 LACTIC ACID, ED  I-STAT CG4 LACTIC ACID, ED    EKG  EKG Interpretation None       Radiology Dg Tibia/fibula Left  Result Date: 01/31/2016 CLINICAL DATA:  Cellulitis, assess for osteomyelitis EXAM: LEFT TIBIA AND FIBULA - 2 VIEW COMPARISON:  06/29/2011 C-arm views of the left hip  and femur FINDINGS: There is diffuse soft tissue swelling consistent with history of cellulitis. Partially visualized distal intra medullary femoral rod and left knee arthroplasty appear intact. No cortical bone destruction nor acute fracture is identified. Vascular phleboliths are seen distally. The bones are slightly osteopenic in appearance ankle mortise is maintained. Calcaneal enthesophytes are noted along the plantar dorsal aspect. IMPRESSION: Soft tissue swelling without acute fracture nor bone destruction. No hardware failure identified. Calcaneal enthesophytes. Electronically Signed   By: Tollie Eth M.D.   On: 01/31/2016 18:57    Procedures Procedures (including critical care time)  Medications Ordered in ED Medications  aspirin EC tablet 81 mg (not administered)  ferrous sulfate tablet 325 mg (not administered)  furosemide (LASIX) tablet 40 mg (not administered)  levothyroxine (SYNTHROID, LEVOTHROID) tablet 25 mcg (not administered)  enoxaparin (LOVENOX) injection 40 mg (40 mg Subcutaneous Given 01/31/16 2236)  ceFAZolin (ANCEF) IVPB 1 g/50 mL premix (1 g Intravenous Given 01/31/16 2236)  clindamycin (CLEOCIN) IVPB 900 mg (0 mg Intravenous Stopped 01/31/16 2027)     Initial Impression / Assessment and Plan / ED Course  I have reviewed the triage vital signs and the nursing notes.  Pertinent labs & imaging results that were available during my care of the patient were reviewed by me and considered in my medical decision making (see chart for details).  Clinical Course     82 year old female comes today with a cellulitic rash of her left lower extremity. This is been a recurrent issue for her. Approximately one month ago she was treated with doxycycline and resolution of the rash. The rash then came back few weeks ago was treated with doxycycline again had some resolution but now is worsening again. She is a cellulitic appearing rash is tender to palpation though there is no pain  out of proportion to exam of crepitus appreciated.  No fluctuant masses appreciated. Appears to be a cellulitis. Having failed outpatient therapy twice with doxycycline she will get IV antibiotics. No fevers chills otherwise symptom-free. No shortness of breath diffuse tenderness, as opposed to in the deep vein tract. Other than age no risk factor for DVT. No tachycardia.  Patient has failed outpatient treatment with oral antibiotics and IV antibiotics need to be continued with monitoring her leg. The leg is marked timed and dated. For the remainder this patient's care please see inpatient team notes. Her leg x-ray showed no gas low suspicion for necrotizing soft tissue infection. Remainder this patient's care please see inpatient team notes, vital signs stable time handoff.  Final Clinical Impressions(s) / ED Diagnoses   Final diagnoses:  Cellulitis of left leg    New Prescriptions Current Discharge Medication List       Cherlynn PerchesEric Iona Stay, MD 01/31/16 2337    Canary Brimhristopher J Tegeler, MD 02/01/16 1147

## 2016-01-31 NOTE — ED Notes (Signed)
Erin (RN) asked for a meal tray for pt, notified secretary to order a meal tray for pt.

## 2016-01-31 NOTE — ED Notes (Signed)
Attempted report x1. 

## 2016-01-31 NOTE — ED Notes (Signed)
EDP at bedside  

## 2016-01-31 NOTE — ED Triage Notes (Signed)
Patient reports recent episode of cellulitis.  Caregiver noticed left leg swelling.  Saw pcp on Tuesday and started on antibiotic.  Also pcp told caregiver to return if worsened.  They called office and was told to come to ucc.  Family reports knots and extended redness, pain, warmth, increased swelling.  Pitting edema.  Left pedial pulse 2+.  Able to move toes

## 2016-02-01 ENCOUNTER — Inpatient Hospital Stay (HOSPITAL_COMMUNITY): Payer: Medicare Other

## 2016-02-01 DIAGNOSIS — M79609 Pain in unspecified limb: Secondary | ICD-10-CM

## 2016-02-01 MED ORDER — OXYCODONE HCL 5 MG PO TABS
5.0000 mg | ORAL_TABLET | Freq: Four times a day (QID) | ORAL | Status: DC | PRN
  Administered 2016-02-02: 5 mg via ORAL
  Filled 2016-02-01 (×2): qty 1

## 2016-02-01 MED ORDER — SODIUM CHLORIDE 0.9 % IV SOLN
INTRAVENOUS | Status: AC
  Administered 2016-02-01: 10:00:00 via INTRAVENOUS

## 2016-02-01 MED ORDER — TRAMADOL HCL 50 MG PO TABS
50.0000 mg | ORAL_TABLET | Freq: Four times a day (QID) | ORAL | Status: DC | PRN
  Administered 2016-02-02 – 2016-02-07 (×4): 50 mg via ORAL
  Filled 2016-02-01 (×4): qty 1

## 2016-02-01 MED ORDER — ACETAMINOPHEN 325 MG PO TABS
650.0000 mg | ORAL_TABLET | Freq: Four times a day (QID) | ORAL | Status: DC | PRN
  Administered 2016-02-02: 650 mg via ORAL
  Filled 2016-02-01: qty 2

## 2016-02-01 NOTE — Progress Notes (Signed)
*  PRELIMINARY RESULTS* Vascular Ultrasound Left lower extremity venous duplex has been completed.  Preliminary findings: no evidence of DVT or superficial thrombosis in visualized veins. No evidence of baker's cyst.   Farrel DemarkJill Eunice, RDMS, RVT  02/01/2016, 3:03 PM

## 2016-02-01 NOTE — Progress Notes (Signed)
PROGRESS NOTE  Gerhard Munchauline G Dermody BJY:[id number] DOB: [birth year] DOA: 01/31/2016 PCP: Murray HodgkinsArthur Green, MD  Hospital Course/Subjective: [age] female with recurrent left leg cellulitis admitted 11/29 for IV antibiotic therapy. She is concerned about her diagnosis but seems to be doing well overall, denies much pain, no chest pain, no fevers, nausea.  Assessment/Plan: Cellulitis - keep elevated, continue Ancef, seems to be improving. Note XR without evidence of deeper infection. Given impressive edema, will check Doppler US this AM to rule out DVT.   AKI - baseline Cr around 1, now elevated. She tells me her urine output seems low (did not wet a pad last night) so will give gentle IVF today and recheck BMP in AM.  DVT Prophylaxis: Lovenox  Code Status: FULL   Family Communication: None present this AM.   Disposition Plan: Likely to home in 48 hours   Consultants:  None   Procedures:  None   Antimicrobials:  IV Ancef    Objective: Vitals:   01/31/16 2015 01/31/16 2016 01/31/16 2045 02/01/16 0614  BP: 129/55  (!) 141/51 129/62  Pulse:  78 79 80  Resp:   16 16  Temp:   98.2 F (36.8 C) 99.3 F (37.4 C)  TempSrc:   Oral Oral  SpO2:   98% 97%  Weight:   63.3 kg (139 lb 8 oz)   Height:   5\' 3"  (1.6 m)     Intake/Output Summary (Last 24 hours) at 02/01/16 1012 Last data filed at 02/01/16 0933  Gross per 24 hour  Intake              360 ml  Output              800 ml  Net             -440 ml   Filed Weights   01/31/16 2045  Weight: 63.3 kg (139 lb 8 oz)     Exam: General:  Alert, oriented, calm, in no acute distress Eyes: EOMI, clear sclerea Neck: supple, no masses, trachea mildline  Cardiovascular: RRR, no murmurs or rubs, she has mild LE edema, the left leg has 3+ pitting edema to the knee Respiratory: clear to auscultation bilaterally, no wheezes, no crackles  Abdomen: soft, nontender, nondistended, normal bowel tones heard  Skin: dry, LLE with circumferential  swelling, redness up to mid-shin, dark, not tender, no fluctuant areas or drainage, has regressed significantly from dotted line just below knee drawn in ER last night Musculoskeletal: no joint effusions, normal range of motion  Psychiatric: appropriate affect, normal speech  Neurologic: extraocular muscles intact, clear speech, moving all extremities with intact sensorium    Data Reviewed: CBC:  Recent Labs Lab 01/31/16 1400  WBC 9.4  NEUTROABS 8.0*  HGB 10.0*  HCT 31.2*  MCV 82.8  PLT 151   Basic Metabolic Panel:  Recent Labs Lab 01/31/16 1400  NA 134*  K 4.1  CL 99*  CO2 26  GLUCOSE 109*  BUN 36*  CREATININE 1.27*  CALCIUM 9.0   GFR: Estimated Creatinine Clearance: 25.9 mL/min (by C-G formula based on SCr of 1.27 mg/dL (H)). Liver Function Tests:  Recent Labs Lab 01/31/16 1400  AST 31  ALT 16  ALKPHOS 64  BILITOT 0.9  PROT 6.8  ALBUMIN 2.9*   No results for input(s): LIPASE, AMYLASE in the last 168 hours. No results for input(s): AMMONIA in the last 168 hours. Coagulation Profile: No results for input(s): INR, PROTIME in the last 168  hours. Cardiac Enzymes: No results for input(s): CKTOTAL, CKMB, CKMBINDEX, TROPONINI in the last 168 hours. BNP (last 3 results) No results for input(s): PROBNP in the last 8760 hours. HbA1C: No results for input(s): HGBA1C in the last 72 hours. CBG:  Recent Labs Lab 01/31/16 1402  GLUCAP 101*   Lipid Profile: No results for input(s): CHOL, HDL, LDLCALC, TRIG, CHOLHDL, LDLDIRECT in the last 72 hours. Thyroid Function Tests: No results for input(s): TSH, T4TOTAL, FREET4, T3FREE, THYROIDAB in the last 72 hours. Anemia Panel: No results for input(s): VITAMINB12, FOLATE, FERRITIN, TIBC, IRON, RETICCTPCT in the last 72 hours. Urine analysis:    Component Value Date/Time   COLORURINE YELLOW 05/19/2015 1554   APPEARANCEUR CLEAR 05/19/2015 1554   LABSPEC 1.018 05/19/2015 1554   PHURINE 7.0 05/19/2015 1554   GLUCOSEU  NEGATIVE 05/19/2015 1554   HGBUR TRACE (A) 05/19/2015 1554   BILIRUBINUR NEGATIVE 05/19/2015 1554   BILIRUBINUR Pos 02/24/2015 1151   KETONESUR NEGATIVE 05/19/2015 1554   PROTEINUR 30 (A) 05/19/2015 1554   UROBILINOGEN 0.2 02/24/2015 1151   UROBILINOGEN 1.0 08/25/2012 1929   NITRITE NEGATIVE 05/19/2015 1554   LEUKOCYTESUR NEGATIVE 05/19/2015 1554   Sepsis Labs: @LABRCNTIP (procalcitonin:4,lacticidven:4)  )No results found for this or any previous visit (from the past 240 hour(s)).   Studies: Dg Tibia/fibula Left  Result Date: 01/31/2016 CLINICAL DATA:  Cellulitis, assess for osteomyelitis EXAM: LEFT TIBIA AND FIBULA - 2 VIEW COMPARISON:  06/29/2011 C-arm views of the left hip and femur FINDINGS: There is diffuse soft tissue swelling consistent with history of cellulitis. Partially visualized distal intra medullary femoral rod and left knee arthroplasty appear intact. No cortical bone destruction nor acute fracture is identified. Vascular phleboliths are seen distally. The bones are slightly osteopenic in appearance ankle mortise is maintained. Calcaneal enthesophytes are noted along the plantar dorsal aspect. IMPRESSION: Soft tissue swelling without acute fracture nor bone destruction. No hardware failure identified. Calcaneal enthesophytes. Electronically Signed   By: Tollie Ethavid  Kwon M.D.   On: 01/31/2016 18:57    Scheduled Meds: . aspirin EC  81 mg Oral Daily  .  ceFAZolin (ANCEF) IV  1 g Intravenous Q12H  . enoxaparin (LOVENOX) injection  40 mg Subcutaneous Q24H  . ferrous sulfate  325 mg Oral Daily  . furosemide  40 mg Oral Daily  . levothyroxine  25 mcg Oral QAC breakfast    Continuous Infusions: . sodium chloride       LOS: 1 day   Time spent: 32 minutes  Donnamaria Shands Vergie LivingMohammed Akeya Ryther, MD [facility] Pager [phone]  If 7PM-7AM, please contact night-coverage [email or web address] Password [email or web address] 02/01/2016, 10:12 AM

## 2016-02-02 DIAGNOSIS — N179 Acute kidney failure, unspecified: Secondary | ICD-10-CM

## 2016-02-02 LAB — BASIC METABOLIC PANEL
Anion gap: 9 (ref 5–15)
BUN: 32 mg/dL — AB (ref 6–20)
CHLORIDE: 101 mmol/L (ref 101–111)
CO2: 25 mmol/L (ref 22–32)
CREATININE: 1.58 mg/dL — AB (ref 0.44–1.00)
Calcium: 8.2 mg/dL — ABNORMAL LOW (ref 8.9–10.3)
GFR calc Af Amer: 32 mL/min — ABNORMAL LOW (ref >60)
GFR calc non Af Amer: 27 mL/min — ABNORMAL LOW (ref >60)
GLUCOSE: 120 mg/dL — AB (ref 65–99)
POTASSIUM: 3.6 mmol/L (ref 3.5–5.1)
SODIUM: 135 mmol/L (ref 135–145)

## 2016-02-02 MED ORDER — SODIUM CHLORIDE 0.9 % IV SOLN
INTRAVENOUS | Status: DC
Start: 2016-02-02 — End: 2016-02-03
  Administered 2016-02-02 (×2): via INTRAVENOUS

## 2016-02-02 MED ORDER — ENOXAPARIN SODIUM 30 MG/0.3ML ~~LOC~~ SOLN
30.0000 mg | SUBCUTANEOUS | Status: DC
  Administered 2016-02-02 – 2016-02-07 (×6): 30 mg via SUBCUTANEOUS
  Filled 2016-02-02 (×6): qty 0.3

## 2016-02-02 NOTE — Progress Notes (Signed)
[facility]                                                                              Patient Demographics  Jessica Hawkins, is a 82 y.o. female, DOB - [birth year], ZOX:[id number]  Admit date - 01/31/2016   Admitting Physician Hillary BowJared M Gardner, DO  Outpatient Primary MD for the patient is Murray HodgkinsArthur Green, MD  Outpatient specialists:   LOS - 2  days    Chief Complaint  Patient presents with  . cellulitis to left leg       Brief summary  [age] female with recurrent left leg cellulitis. Patient has failed outpatient treatment for left leg cellulitis.    Assessment & Plan    Principal Problem:   Cellulitis of left anterior lower leg - Doppler ultrasound of the left lower extremity negative for DVT - Continue IV Ancef - If no significant improvement by tomorrow, will change to IV vancomycin  Acute kidney injury - Baseline creatinine around 1, creatinine trended up to 1.5 - Discontinue Lasix, placed on gentle hydration - Recheck BMET in a.m.  Code Status: full code  DVT Prophylaxis:  Lovenox  Family Communication: Discussed in detail with the patient, all imaging results, lab results explained to the patient   Disposition Plan: hopefully in next 2 days   Time Spent in minutes 25 minutes  Procedures:  Venous dopplers   Consultants:     Antimicrobials:      Medications  Scheduled Meds: . aspirin EC  81 mg Oral Daily  .  ceFAZolin (ANCEF) IV  1 g Intravenous Q12H  . enoxaparin (LOVENOX) injection  40 mg Subcutaneous Q24H  . ferrous sulfate  325 mg Oral Daily  . furosemide  40 mg Oral Daily  . levothyroxine  25 mcg Oral QAC breakfast   Continuous Infusions: PRN Meds:.acetaminophen, oxyCODONE, traMADol   Antibiotics   Anti-infectives    Start     Dose/Rate Route Frequency Ordered Stop   02/01/16 0600  clindamycin (CLEOCIN) IVPB 600 mg  Status:  Discontinued     600 mg 100 mL/hr over 30 Minutes Intravenous Every 8 hours 01/31/16 1926  01/31/16 1928   01/31/16 2205  ceFAZolin (ANCEF) IVPB 1 g/50 mL premix     1 g 100 mL/hr over 30 Minutes Intravenous Every 12 hours 01/31/16 2205     01/31/16 2200  ceFAZolin (ANCEF) IVPB 1 g/50 mL premix  Status:  Discontinued     1 g 100 mL/hr over 30 Minutes Intravenous Every 8 hours 01/31/16 1955 01/31/16 2205   01/31/16 1700  clindamycin (CLEOCIN) IVPB 900 mg     900 mg 100 mL/hr over 30 Minutes Intravenous  Once 01/31/16 1646 01/31/16 2027        Subjective:   Jessica Hawkins was seen and examined today.  Patient denies dizziness, chest pain, shortness of breath, abdominal pain, N/V/D/C, new weakness, numbess, tingling. No acute events overnight.    Objective:   Vitals:   02/01/16 0614 02/01/16 1305 02/01/16 2030 02/02/16 0630  BP: 129/62 (!) 123/52 (!) 120/50 (!) 131/52  Pulse: 80 86 79 79  Resp: 16 16 16  16  Temp: 99.3 F (37.4 C) 98.4 F (36.9 C) 98.5 F (36.9 C) 98.6 F (37 C)  TempSrc: Oral Oral Oral Oral  SpO2: 97% 99% 98% 96%  Weight:      Height:        Intake/Output Summary (Last 24 hours) at 02/02/16 1133 Last data filed at 02/02/16 [id number]  Gross per 24 hour  Intake              510 ml  Output             1950 ml  Net            -1440 ml     Wt Readings from Last 3 Encounters:  01/31/16 63.3 kg (139 lb 8 oz)  01/30/16 64.3 kg (141 lb 12.8 oz)  01/10/16 61.2 kg (135 lb)     Exam  General: Alert and oriented x 3, NAD  HEENT:    Neck: Supple, no JVD, no masses  Cardiovascular: S1 S2 auscultated, no rubs, murmurs or gallops. Regular rate and rhythm.  Respiratory: Clear to auscultation bilaterally, no wheezing, rales or rhonchi  Gastrointestinal: Soft, nontender, nondistended, + bowel sounds  Ext: LLE with swelling, redness up to mid-shin, no tenderness, no fluctuance or induration  Neuro: AAOx3, Cr N's II- XII. Strength 5/5 upper and lower extremities bilaterally  Skin: No rashes  Psych: Normal affect and demeanor, alert and oriented x3     Data Reviewed:  I have personally reviewed following labs and imaging studies  Micro Results Recent Results (from the past 240 hour(s))  Blood culture (routine x 2)     Status: None (Preliminary result)   Collection Time: 01/31/16  5:28 PM  Result Value Ref Range Status   Specimen Description BLOOD RIGHT ANTECUBITAL  Final   Special Requests BOTTLES DRAWN AEROBIC AND ANAEROBIC 5 CC  Final   Culture NO GROWTH < 24 HOURS  Final   Report Status PENDING  Incomplete  Blood culture (routine x 2)     Status: None (Preliminary result)   Collection Time: 01/31/16  5:50 PM  Result Value Ref Range Status   Specimen Description BLOOD LEFT ANTECUBITAL  Final   Special Requests BOTTLES DRAWN AEROBIC AND ANAEROBIC 10 CC  Final   Culture NO GROWTH < 24 HOURS  Final   Report Status PENDING  Incomplete    Radiology Reports Dg Tibia/fibula Left  Result Date: 01/31/2016 CLINICAL DATA:  Cellulitis, assess for osteomyelitis EXAM: LEFT TIBIA AND FIBULA - 2 VIEW COMPARISON:  06/29/2011 C-arm views of the left hip and femur FINDINGS: There is diffuse soft tissue swelling consistent with history of cellulitis. Partially visualized distal intra medullary femoral rod and left knee arthroplasty appear intact. No cortical bone destruction nor acute fracture is identified. Vascular phleboliths are seen distally. The bones are slightly osteopenic in appearance ankle mortise is maintained. Calcaneal enthesophytes are noted along the plantar dorsal aspect. IMPRESSION: Soft tissue swelling without acute fracture nor bone destruction. No hardware failure identified. Calcaneal enthesophytes. Electronically Signed   By: Tollie Ethavid  Kwon M.D.   On: 01/31/2016 18:57    Lab Data:  CBC:  Recent Labs Lab 01/31/16 1400  WBC 9.4  NEUTROABS 8.0*  HGB 10.0*  HCT 31.2*  MCV 82.8  PLT 151   Basic Metabolic Panel:  Recent Labs Lab 01/31/16 1400 02/02/16 0430  NA 134* 135  K 4.1 3.6  CL 99* 101  CO2 26 25  GLUCOSE  109* 120*  BUN 36* 32*  CREATININE 1.27* 1.58*  CALCIUM 9.0 8.2*   GFR: Estimated Creatinine Clearance: 20.8 mL/min (by C-G formula based on SCr of 1.58 mg/dL (H)). Liver Function Tests:  Recent Labs Lab 01/31/16 1400  AST 31  ALT 16  ALKPHOS 64  BILITOT 0.9  PROT 6.8  ALBUMIN 2.9*   No results for input(s): LIPASE, AMYLASE in the last 168 hours. No results for input(s): AMMONIA in the last 168 hours. Coagulation Profile: No results for input(s): INR, PROTIME in the last 168 hours. Cardiac Enzymes: No results for input(s): CKTOTAL, CKMB, CKMBINDEX, TROPONINI in the last 168 hours. BNP (last 3 results) No results for input(s): PROBNP in the last 8760 hours. HbA1C: No results for input(s): HGBA1C in the last 72 hours. CBG:  Recent Labs Lab 01/31/16 1402  GLUCAP 101*   Lipid Profile: No results for input(s): CHOL, HDL, LDLCALC, TRIG, CHOLHDL, LDLDIRECT in the last 72 hours. Thyroid Function Tests: No results for input(s): TSH, T4TOTAL, FREET4, T3FREE, THYROIDAB in the last 72 hours. Anemia Panel: No results for input(s): VITAMINB12, FOLATE, FERRITIN, TIBC, IRON, RETICCTPCT in the last 72 hours. Urine analysis:    Component Value Date/Time   COLORURINE YELLOW 05/19/2015 1554   APPEARANCEUR CLEAR 05/19/2015 1554   LABSPEC 1.018 05/19/2015 1554   PHURINE 7.0 05/19/2015 1554   GLUCOSEU NEGATIVE 05/19/2015 1554   HGBUR TRACE (A) 05/19/2015 1554   BILIRUBINUR NEGATIVE 05/19/2015 1554   BILIRUBINUR Pos 02/24/2015 1151   KETONESUR NEGATIVE 05/19/2015 1554   PROTEINUR 30 (A) 05/19/2015 1554   UROBILINOGEN 0.2 02/24/2015 1151   UROBILINOGEN 1.0 08/25/2012 1929   NITRITE NEGATIVE 05/19/2015 1554   LEUKOCYTESUR NEGATIVE 05/19/2015 1554     RAI,RIPUDEEP M.D. [facility] 02/02/2016, 11:33 AM  Pager: [phone] Between 7am to 7pm - call Pager - [phone]  After 7pm go to [email or web address] - password [email or web address]  Call night coverage person covering after 7pm

## 2016-02-03 LAB — BASIC METABOLIC PANEL
Anion gap: 9 (ref 5–15)
BUN: 28 mg/dL — AB (ref 6–20)
CHLORIDE: 103 mmol/L (ref 101–111)
CO2: 24 mmol/L (ref 22–32)
CREATININE: 1.48 mg/dL — AB (ref 0.44–1.00)
Calcium: 8.1 mg/dL — ABNORMAL LOW (ref 8.9–10.3)
GFR calc Af Amer: 34 mL/min — ABNORMAL LOW (ref >60)
GFR calc non Af Amer: 30 mL/min — ABNORMAL LOW (ref >60)
GLUCOSE: 113 mg/dL — AB (ref 65–99)
POTASSIUM: 3.9 mmol/L (ref 3.5–5.1)
SODIUM: 136 mmol/L (ref 135–145)

## 2016-02-03 MED ORDER — IPRATROPIUM-ALBUTEROL 0.5-2.5 (3) MG/3ML IN SOLN
3.0000 mL | RESPIRATORY_TRACT | Status: DC | PRN
  Administered 2016-02-03: 3 mL via RESPIRATORY_TRACT
  Filled 2016-02-03: qty 3

## 2016-02-03 MED ORDER — SODIUM CHLORIDE 0.9 % IV SOLN
500.0000 mg | INTRAVENOUS | Status: DC
  Administered 2016-02-04 – 2016-02-06 (×3): 500 mg via INTRAVENOUS
  Filled 2016-02-03 (×4): qty 500

## 2016-02-03 MED ORDER — VANCOMYCIN HCL IN DEXTROSE 1-5 GM/200ML-% IV SOLN
1000.0000 mg | Freq: Once | INTRAVENOUS | Status: AC
  Administered 2016-02-03: 1000 mg via INTRAVENOUS
  Filled 2016-02-03: qty 200

## 2016-02-03 MED ORDER — PIPERACILLIN-TAZOBACTAM 3.375 G IVPB
3.3750 g | Freq: Three times a day (TID) | INTRAVENOUS | Status: DC
  Administered 2016-02-03 – 2016-02-07 (×11): 3.375 g via INTRAVENOUS
  Filled 2016-02-03 (×14): qty 50

## 2016-02-03 MED ORDER — PIPERACILLIN-TAZOBACTAM 3.375 G IVPB 30 MIN
3.3750 g | Freq: Once | INTRAVENOUS | Status: AC
  Administered 2016-02-03: 3.375 g via INTRAVENOUS
  Filled 2016-02-03: qty 50

## 2016-02-03 NOTE — Progress Notes (Signed)
Pt having some wheezing, respiratory called to give Duoneb.

## 2016-02-03 NOTE — Progress Notes (Signed)
Pharmacy Antibiotic Note  Jessica Hawkins is a 82 y.o. female admitted on 01/31/2016 with left leg cellulitis that did not respond to outpatient doxycycline.  Patient was started on Ancef on admission and now to broaden to vancomycin and Zosyn.    Patient's renal function is starting to improve.  She is afebrile and her WBC is WNL.   Plan: - Vanc 1gm IV x 1 as ordered, then 500mg  IV Q24H - Zosyn 3.375gm IV Q8H, 4 hr infusion - Monitor renal fxn, clinical progress, vanc trough as indicated   Height: 5\' 3"  (160 cm) Weight: 139 lb 8 oz (63.3 kg) IBW/kg (Calculated) : 52.4  Temp (24hrs), Avg:98.3 F (36.8 C), Min:97.8 F (36.6 C), Max:98.9 F (37.2 C)   Recent Labs Lab 01/31/16 1400 01/31/16 1436 01/31/16 1740 02/02/16 0430 02/03/16 0609  WBC 9.4  --   --   --   --   CREATININE 1.27*  --   --  1.58* 1.48*  LATICACIDVEN  --  1.28 1.12  --   --     Estimated Creatinine Clearance: 22.2 mL/min (by C-G formula based on SCr of 1.48 mg/dL (H)).    No Known Allergies   Antimicrobials this admission:  Ancef 11/29 >> 12/2 Vanc 12/2 >> Zosyn 12/2 >> Doxy PTA  Dose adjustments this admission:  N/A  Microbiology results:  11/29 BCx x2 -   Chelsea Aushuy D. Laney Potashang, PharmD, BCPS Pager:  [phone] 02/03/2016, 11:26 AM

## 2016-02-03 NOTE — Progress Notes (Signed)
[facility]                                                                              Patient Demographics  Jessica Hawkins, is a 82 y.o. female, DOB - Aug 30, 1922, ZOX:[id number]  Admit date - 01/31/2016   Admitting Physician Hillary Bow, DO  Outpatient Primary MD for the patient is Murray Hodgkins, MD  Outpatient specialists:   LOS - 3  days    Chief Complaint  Patient presents with  . cellulitis to left leg       Brief summary  83 female with recurrent left leg cellulitis. Patient has failed outpatient treatment for left leg cellulitis x2 with doxycycline.    Assessment & Plan    Principal Problem:   Cellulitis of left anterior lower leg - No significant improvement, still with tenderness, erythematous and swelling of the left leg to shin - Doppler ultrasound of the left lower extremity negative for DVT - DC IV Ancef, will place on IV vancomycin and Zosyn to treat severe nonpurulent cellulitis - Follow creatinine function closely  Acute kidney injury - Baseline creatinine around 1, creatinine trended up to 1.5 - hold Lasix, patient was placed on gentle hydration. creatinine has started to improve. - Follow renal function closely with vancomycin on board  Code Status: full code  DVT Prophylaxis:  Lovenox  Family Communication: Discussed in detail with the patient, all imaging results, lab results explained to the patient and caretaker at bedside  Disposition Plan:   Time Spent in minutes 25 minutes  Procedures:  Venous dopplers   Consultants:     Antimicrobials:   Ancef 11/29- 12/2  IV vancomycin 12/2 ->  IV Zosyn 12/2 ->   Medications  Scheduled Meds: . aspirin EC  81 mg Oral Daily  . enoxaparin (LOVENOX) injection  30 mg Subcutaneous Q24H  . ferrous sulfate  325 mg Oral Daily  . levothyroxine  25 mcg Oral QAC breakfast  . piperacillin-tazobactam  3.375 g Intravenous Once  . piperacillin-tazobactam (ZOSYN)  IV  3.375 g  Intravenous Q8H  . [START ON 02/04/2016] vancomycin  500 mg Intravenous Q24H  . vancomycin  1,000 mg Intravenous Once   Continuous Infusions: PRN Meds:.acetaminophen, ipratropium-albuterol, oxyCODONE, traMADol   Antibiotics   Anti-infectives    Start     Dose/Rate Route Frequency Ordered Stop   02/04/16 1500  vancomycin (VANCOCIN) 500 mg in sodium chloride 0.9 % 100 mL IVPB     500 mg 100 mL/hr over 60 Minutes Intravenous Every 24 hours 02/03/16 1127     02/03/16 2100  piperacillin-tazobactam (ZOSYN) IVPB 3.375 g     3.375 g 12.5 mL/hr over 240 Minutes Intravenous Every 8 hours 02/03/16 1127     02/03/16 1045  piperacillin-tazobactam (ZOSYN) IVPB 3.375 g     3.375 g 100 mL/hr over 30 Minutes Intravenous  Once 02/03/16 1036     02/03/16 1045  vancomycin (VANCOCIN) IVPB 1000 mg/200 mL premix     1,000 mg 200 mL/hr over 60 Minutes Intravenous  Once 02/03/16 1036     02/01/16 0600  clindamycin (CLEOCIN) IVPB 600 mg  Status:  Discontinued  600 mg 100 mL/hr over 30 Minutes Intravenous Every 8 hours 01/31/16 1926 01/31/16 1928   01/31/16 2205  ceFAZolin (ANCEF) IVPB 1 g/50 mL premix  Status:  Discontinued     1 g 100 mL/hr over 30 Minutes Intravenous Every 12 hours 01/31/16 2205 02/03/16 1036   01/31/16 2200  ceFAZolin (ANCEF) IVPB 1 g/50 mL premix  Status:  Discontinued     1 g 100 mL/hr over 30 Minutes Intravenous Every 8 hours 01/31/16 1955 01/31/16 2205   01/31/16 1700  clindamycin (CLEOCIN) IVPB 900 mg     900 mg 100 mL/hr over 30 Minutes Intravenous  Once 01/31/16 1646 01/31/16 2027        Subjective:   Jessica Hawkins was seen and examined today. Does not feel she is improving, left leg still tender, swollen, 'red'. No fevers or chills. Patient denies dizziness, chest pain, shortness of breath, abdominal pain, N/V/D/C, new weakness, numbess, tingling.    Objective:   Vitals:   02/02/16 0630 02/02/16 1427 02/02/16 2042 02/03/16 0450  BP: (!) 131/52 (!) 115/50 (!)  117/49 (!) 111/53  Pulse: 79 73 71 69  Resp: 16 16 17 17   Temp: 98.6 F (37 C) 98.9 F (37.2 C) 97.8 F (36.6 C) 98.1 F (36.7 C)  TempSrc: Oral Oral Oral Oral  SpO2: 96% 100% 98% 98%  Weight:      Height:        Intake/Output Summary (Last 24 hours) at 02/03/16 1142 Last data filed at 02/03/16 1134  Gross per 24 hour  Intake          1163.75 ml  Output              650 ml  Net           513.75 ml     Wt Readings from Last 3 Encounters:  01/31/16 63.3 kg (139 lb 8 oz)  01/30/16 64.3 kg (141 lb 12.8 oz)  01/10/16 61.2 kg (135 lb)     Exam  General: Alert and oriented x 3, NAD  HEENT:    Neck: Supple, no JVD, no masses  Cardiovascular: S1 S2 auscultated, RRR  Respiratory: Clear to auscultation bilaterally, no wheezing, rales or rhonchi  Gastrointestinal: Soft, nontender, nondistended, + bowel sounds  Ext: LLE with swelling, redness up to mid-shin, tenderness, no fluctuance or induration  Neuro: no focal neurological deficits  Skin: No rashes  Psych: Normal affect and demeanor, alert and oriented x3    Data Reviewed:  I have personally reviewed following labs and imaging studies  Micro Results Recent Results (from the past 240 hour(s))  Blood culture (routine x 2)     Status: None (Preliminary result)   Collection Time: 01/31/16  5:28 PM  Result Value Ref Range Status   Specimen Description BLOOD RIGHT ANTECUBITAL  Final   Special Requests BOTTLES DRAWN AEROBIC AND ANAEROBIC 5 CC  Final   Culture NO GROWTH 2 DAYS  Final   Report Status PENDING  Incomplete  Blood culture (routine x 2)     Status: None (Preliminary result)   Collection Time: 01/31/16  5:50 PM  Result Value Ref Range Status   Specimen Description BLOOD LEFT ANTECUBITAL  Final   Special Requests BOTTLES DRAWN AEROBIC AND ANAEROBIC 10 CC  Final   Culture NO GROWTH 2 DAYS  Final   Report Status PENDING  Incomplete    Radiology Reports Dg Tibia/fibula Left  Result Date:  01/31/2016 CLINICAL DATA:  Cellulitis, assess for  osteomyelitis EXAM: LEFT TIBIA AND FIBULA - 2 VIEW COMPARISON:  06/29/2011 C-arm views of the left hip and femur FINDINGS: There is diffuse soft tissue swelling consistent with history of cellulitis. Partially visualized distal intra medullary femoral rod and left knee arthroplasty appear intact. No cortical bone destruction nor acute fracture is identified. Vascular phleboliths are seen distally. The bones are slightly osteopenic in appearance ankle mortise is maintained. Calcaneal enthesophytes are noted along the plantar dorsal aspect. IMPRESSION: Soft tissue swelling without acute fracture nor bone destruction. No hardware failure identified. Calcaneal enthesophytes. Electronically Signed   By: Tollie Ethavid  Kwon M.D.   On: 01/31/2016 18:57    Lab Data:  CBC:  Recent Labs Lab 01/31/16 1400  WBC 9.4  NEUTROABS 8.0*  HGB 10.0*  HCT 31.2*  MCV 82.8  PLT 151   Basic Metabolic Panel:  Recent Labs Lab 01/31/16 1400 02/02/16 0430 02/03/16 0609  NA 134* 135 136  K 4.1 3.6 3.9  CL 99* 101 103  CO2 26 25 24   GLUCOSE 109* 120* 113*  BUN 36* 32* 28*  CREATININE 1.27* 1.58* 1.48*  CALCIUM 9.0 8.2* 8.1*   GFR: Estimated Creatinine Clearance: 22.2 mL/min (by C-G formula based on SCr of 1.48 mg/dL (H)). Liver Function Tests:  Recent Labs Lab 01/31/16 1400  AST 31  ALT 16  ALKPHOS 64  BILITOT 0.9  PROT 6.8  ALBUMIN 2.9*   No results for input(s): LIPASE, AMYLASE in the last 168 hours. No results for input(s): AMMONIA in the last 168 hours. Coagulation Profile: No results for input(s): INR, PROTIME in the last 168 hours. Cardiac Enzymes: No results for input(s): CKTOTAL, CKMB, CKMBINDEX, TROPONINI in the last 168 hours. BNP (last 3 results) No results for input(s): PROBNP in the last 8760 hours. HbA1C: No results for input(s): HGBA1C in the last 72 hours. CBG:  Recent Labs Lab 01/31/16 1402  GLUCAP 101*   Lipid Profile: No  results for input(s): CHOL, HDL, LDLCALC, TRIG, CHOLHDL, LDLDIRECT in the last 72 hours. Thyroid Function Tests: No results for input(s): TSH, T4TOTAL, FREET4, T3FREE, THYROIDAB in the last 72 hours. Anemia Panel: No results for input(s): VITAMINB12, FOLATE, FERRITIN, TIBC, IRON, RETICCTPCT in the last 72 hours. Urine analysis:    Component Value Date/Time   COLORURINE YELLOW 05/19/2015 1554   APPEARANCEUR CLEAR 05/19/2015 1554   LABSPEC 1.018 05/19/2015 1554   PHURINE 7.0 05/19/2015 1554   GLUCOSEU NEGATIVE 05/19/2015 1554   HGBUR TRACE (A) 05/19/2015 1554   BILIRUBINUR NEGATIVE 05/19/2015 1554   BILIRUBINUR Pos 02/24/2015 1151   KETONESUR NEGATIVE 05/19/2015 1554   PROTEINUR 30 (A) 05/19/2015 1554   UROBILINOGEN 0.2 02/24/2015 1151   UROBILINOGEN 1.0 08/25/2012 1929   NITRITE NEGATIVE 05/19/2015 1554   LEUKOCYTESUR NEGATIVE 05/19/2015 1554     RAI,RIPUDEEP M.D. [facility] 02/03/2016, 11:42 AM  Pager: [phone] Between 7am to 7pm - call Pager - [phone]  After 7pm go to [email or web address] - password [email or web address]  Call night coverage person covering after 7pm

## 2016-02-04 LAB — BASIC METABOLIC PANEL
Anion gap: 9 (ref 5–15)
BUN: 24 mg/dL — AB (ref 6–20)
CHLORIDE: 100 mmol/L — AB (ref 101–111)
CO2: 23 mmol/L (ref 22–32)
Calcium: 8.1 mg/dL — ABNORMAL LOW (ref 8.9–10.3)
Creatinine, Ser: 1.38 mg/dL — ABNORMAL HIGH (ref 0.44–1.00)
GFR calc Af Amer: 37 mL/min — ABNORMAL LOW (ref >60)
GFR calc non Af Amer: 32 mL/min — ABNORMAL LOW (ref >60)
GLUCOSE: 123 mg/dL — AB (ref 65–99)
POTASSIUM: 4 mmol/L (ref 3.5–5.1)
Sodium: 132 mmol/L — ABNORMAL LOW (ref 135–145)

## 2016-02-04 NOTE — Progress Notes (Addendum)
PROGRESS NOTE    Jessica Hawkins [place]  ZOX:[id number] DOB: [birth year] DOA: 01/31/2016 PCP: Murray HodgkinsArthur Green, MD   Brief Narrative: 82 y/o female with recurrent left leg cellulitis. Patient has failed outpatient treatment for left leg cellulitis x2 with doxycycline.  Assessment & Plan:  # Cellulitis of left anterior lower leg: -Doppler ultrasound negative for DVT. Reportedly patient failed outpatient oral antibiotics. As per prior progress note, patient did not respond well with IV Ancef and therefore antibiotics changed to IV vancomycin and Zosyn. Patient has nonpurulent erythematous cellulitis in lower extremity. The rash seems improving by comparing with prior rash marking.  -If continues to improve, may be able to switch to oral antibiotics covering both strep and MRSA.  # Acute on chronic kidney disease stage III: Serum creatinine level is stable. Continue to monitor. Lasix on hold. Avoid nephrotoxins.  #Hypothyroidism: Continue Synthroid.  DVT prophylaxis: Lovenox subcutaneous Code Status: Full code Family Communication: Caregiver at bedside Disposition Plan: Likely discharge home in 1-2 days.  Consultants:   None  Procedures: None Antimicrobials: IV vancomycin and Zosyn.  Subjective: Patient was seen and examined at bedside. Caregiver present. Patient reported left leg redness and swelling is gradually improving. Denied chills, nausea, vomiting, chest pressure shortness of breath.  Objective: Vitals:   02/03/16 1206 02/03/16 1241 02/03/16 2036 02/04/16 0508  BP:  (!) 127/51 (!) 140/55 (!) 134/49  Pulse:  69 73 73  Resp:  16 17 16   Temp:  98 F (36.7 C) 97.8 F (36.6 C) 98.4 F (36.9 C)  TempSrc:  Oral Oral Oral  SpO2: 98% 100% 100% 98%  Weight:      Height:        Intake/Output Summary (Last 24 hours) at 02/04/16 1402 Last data filed at 02/04/16 0900  Gross per 24 hour  Intake              660 ml  Output             2100 ml  Net            -1440 ml   Filed Weights    01/31/16 2045  Weight: 63.3 kg (139 lb 8 oz)    Examination:  General exam: Pleasant elderly female lying on bed, not in distress.   Respiratory system: Clear to auscultation. Respiratory effort normal. No wheezing or crackle Cardiovascular system: S1 & S2 heard, RRR.  No pedal edema. Gastrointestinal system: Abdomen is nondistended, soft and nontender. Normal bowel sounds heard. Central nervous system: Alert and oriented. No focal neurological deficits. Extremities: Symmetric 5 x 5 power. Skin: Left lower extremity erythematous rashes improving compared to prior marking, warmth. Psychiatry:  Mood & affect appropriate for her age.    Data Reviewed: I have personally reviewed following labs and imaging studies  CBC:  Recent Labs Lab 01/31/16 1400  WBC 9.4  NEUTROABS 8.0*  HGB 10.0*  HCT 31.2*  MCV 82.8  PLT 151   Basic Metabolic Panel:  Recent Labs Lab 01/31/16 1400 02/02/16 0430 02/03/16 0609 02/04/16 0312  NA 134* 135 136 132*  K 4.1 3.6 3.9 4.0  CL 99* 101 103 100*  CO2 26 25 24 23   GLUCOSE 109* 120* 113* 123*  BUN 36* 32* 28* 24*  CREATININE 1.27* 1.58* 1.48* 1.38*  CALCIUM 9.0 8.2* 8.1* 8.1*   GFR: Estimated Creatinine Clearance: 23.8 mL/min (by C-Hawkins formula based on SCr of 1.38 mg/dL (H)). Liver Function Tests:  Recent Labs Lab 01/31/16 1400  AST 31  ALT 16  ALKPHOS 64  BILITOT 0.9  PROT 6.8  ALBUMIN 2.9*   No results for input(s): LIPASE, AMYLASE in the last 168 hours. No results for input(s): AMMONIA in the last 168 hours. Coagulation Profile: No results for input(s): INR, PROTIME in the last 168 hours. Cardiac Enzymes: No results for input(s): CKTOTAL, CKMB, CKMBINDEX, TROPONINI in the last 168 hours. BNP (last 3 results) No results for input(s): PROBNP in the last 8760 hours. HbA1C: No results for input(s): HGBA1C in the last 72 hours. CBG:  Recent Labs Lab 01/31/16 1402  GLUCAP 101*   Lipid Profile: No results for input(s):  CHOL, HDL, LDLCALC, TRIG, CHOLHDL, LDLDIRECT in the last 72 hours. Thyroid Function Tests: No results for input(s): TSH, T4TOTAL, FREET4, T3FREE, THYROIDAB in the last 72 hours. Anemia Panel: No results for input(s): VITAMINB12, FOLATE, FERRITIN, TIBC, IRON, RETICCTPCT in the last 72 hours. Sepsis Labs:  Recent Labs Lab 01/31/16 1436 01/31/16 1740  LATICACIDVEN 1.28 1.12    Recent Results (from the past 240 hour(s))  Blood culture (routine x 2)     Status: None (Preliminary result)   Collection Time: 01/31/16  5:28 PM  Result Value Ref Range Status   Specimen Description BLOOD RIGHT ANTECUBITAL  Final   Special Requests BOTTLES DRAWN AEROBIC AND ANAEROBIC 5 CC  Final   Culture NO GROWTH 3 DAYS  Final   Report Status PENDING  Incomplete  Blood culture (routine x 2)     Status: None (Preliminary result)   Collection Time: 01/31/16  5:50 PM  Result Value Ref Range Status   Specimen Description BLOOD LEFT ANTECUBITAL  Final   Special Requests BOTTLES DRAWN AEROBIC AND ANAEROBIC 10 CC  Final   Culture NO GROWTH 3 DAYS  Final   Report Status PENDING  Incomplete         Radiology Studies: No results found.      Scheduled Meds: . aspirin EC  81 mg Oral Daily  . enoxaparin (LOVENOX) injection  30 mg Subcutaneous Q24H  . ferrous sulfate  325 mg Oral Daily  . levothyroxine  25 mcg Oral QAC breakfast  . piperacillin-tazobactam (ZOSYN)  IV  3.375 Hawkins Intravenous Q8H  . vancomycin  500 mg Intravenous Q24H   Continuous Infusions:   LOS: 4 days    Dron Jaynie CollinsPrasad Bhandari, MD [facility] Pager [phone]  If 7PM-7AM, please contact night-coverage [email or web address] Password [email or web address] 02/04/2016, 2:02 PM

## 2016-02-05 LAB — CULTURE, BLOOD (ROUTINE X 2)
CULTURE: NO GROWTH
CULTURE: NO GROWTH

## 2016-02-05 NOTE — Care Management Important Message (Signed)
Important Message  Patient Details  Name: Jessica Hawkins MRN: [id number] Date of Birth: January 22, 1923   Medicare Important Message Given:  Yes    Jessica Hawkins 02/05/2016, 12:04 PM

## 2016-02-05 NOTE — Evaluation (Signed)
Physical Therapy Evaluation Patient Details Name: Jessica Hawkins G Truby MRN: [id number] DOB: 11-22-1922 Today's Date: 02/05/2016   History of Present Illness  Patient is a 82 y/o female with hx of incontinence, left TKA, arthritis, vertigo, HTN, HLD, DM, BBB presents with LLE cellulitis failed as outpatient.  Clinical Impression  Patient presents with pain, generalized weakness and deconditioning s/p above. Pt Mod I with RW PTA and going to water aerobics 3x/week. Pt lives with son, has support of children (7 of them) and PCA 5 days/week. Per PCA they can provide 24/7 S at discharge if needed. Tolerated gait training with Min guard assist for balance/safety and requires Min A for standing. Encouraged walking to bathroom with RN throughout the day. Will follow acutely to maximize independence and mobility prior to return home.     Follow Up Recommendations Home health PT;Supervision for mobility/OOB;Supervision/Assistance - 24 hour    Equipment Recommendations  None recommended by PT    Recommendations for Other Services OT consult     Precautions / Restrictions Precautions Precautions: Fall Restrictions Weight Bearing Restrictions: No      Mobility  Bed Mobility Overal bed mobility: Needs Assistance Bed Mobility: Supine to Sit     Supine to sit: Min guard;HOB elevated     General bed mobility comments: Increased time but no assist needed. Use of rail.  Transfers Overall transfer level: Needs assistance Equipment used: Rolling walker (2 wheeled) Transfers: Sit to/from Stand Sit to Stand: Min assist         General transfer comment: Assist to power to standing with cues for hand placement/technique. Reluctant to place weight through LLE and posterior lean noted. Stood from Office DepotEOB xc1, from Newell Rubbermaidchair x1, from toilet x1.   Ambulation/Gait Ambulation/Gait assistance: Min guard Ambulation Distance (Feet): 100 Feet Assistive device: Rolling walker (2 wheeled) Gait Pattern/deviations:  Step-through pattern;Decreased stride length;Trunk flexed;Decreased stance time - left Gait velocity: decreased   General Gait Details: Slow, mostly steady gait using RW. Cues for upright and forward gaze.  Stairs            Wheelchair Mobility    Modified Rankin (Stroke Patients Only)       Balance Overall balance assessment: Needs assistance Sitting-balance support: Feet supported;No upper extremity supported Sitting balance-Leahy Scale: Fair     Standing balance support: During functional activity Standing balance-Leahy Scale: Poor Standing balance comment: Reliant on BUEs for support; requires assist to donn underwear.                              Pertinent Vitals/Pain Pain Assessment: Faces Faces Pain Scale: Hurts little more Pain Location: LLE Pain Descriptors / Indicators: Sore;Tender Pain Intervention(s): Repositioned;Monitored during session    Home Living Family/patient expects to be discharged to:: Private residence Living Arrangements: Children Available Help at Discharge: Family;Available PRN/intermittently Type of Home: House Home Access: Ramped entrance     Home Layout: Two level;Able to live on main level with bedroom/bathroom Home Equipment: Dan HumphreysWalker - 2 wheels;Bedside commode;Cane - single point;Tub bench;Wheelchair - manual      Prior Function Level of Independence: Independent with assistive device(s)         Comments: Pt goes to gym 3X per week, does water aerobics. Has a PCA 5 days/week to help with IADLs. Children stay with her at night sometimes. Son works.     Hand Dominance   Dominant Hand: Right    Extremity/Trunk Assessment   Upper Extremity Assessment:  Defer to OT evaluation           Lower Extremity Assessment: Generalized weakness;LLE deficits/detail   LLE Deficits / Details: swelling and erythema, warmth present LLE.  Cervical / Trunk Assessment: Kyphotic  Communication   Communication: HOH   Cognition Arousal/Alertness: Awake/alert Behavior During Therapy: WFL for tasks assessed/performed Overall Cognitive Status: History of cognitive impairments - at baseline                      General Comments General comments (skin integrity, edema, etc.): PCA present towards end of session.    Exercises     Assessment/Plan    PT Assessment Patient needs continued PT services  PT Problem List Decreased strength;Decreased mobility;Decreased activity tolerance;Decreased balance;Pain          PT Treatment Interventions DME instruction;Therapeutic activities;Gait training;Therapeutic exercise;Patient/family education;Balance training;Functional mobility training    PT Goals (Current goals can be found in the Care Plan section)  Acute Rehab PT Goals Patient Stated Goal: to get stronger so I can get around at home PT Goal Formulation: With patient Time For Goal Achievement: 02/19/16 Potential to Achieve Goals: Good    Frequency Min 3X/week   Barriers to discharge Decreased caregiver support      Co-evaluation               End of Session Equipment Utilized During Treatment: Gait belt Activity Tolerance: Patient tolerated treatment well Patient left: in chair;with call bell/phone within reach;with family/visitor present Nurse Communication: Mobility status         Time: [id number] PT Time Calculation (min) (ACUTE ONLY): 51 min   Charges:   PT Evaluation $PT Eval Low Complexity: 1 Procedure PT Treatments $Gait Training: 23-37 mins   PT G Codes:        Zariel Capano A Colletta Spillers 02/05/2016, 4:04 PM Mylo RedShauna Naileah Karg, PT, DPT [phone]

## 2016-02-05 NOTE — Progress Notes (Signed)
PROGRESS NOTE  Jessica Hawkins ZOX:[id number] DOB: [birth year] DOA: 01/31/2016 PCP: Murray HodgkinsArthur Green, MD   LOS: 5 days   Brief Narrative: 82 y/o female with recurrent left leg cellulitis. Patient has failed outpatient treatment for left leg cellulitis x2with doxycycline.  Assessment & Plan: Principal Problem:   Cellulitis of left anterior lower leg   Cellulitis of left anterior lower leg - Doppler ultrasound negative for DVT. Reportedly patient failed outpatient oral antibiotics. As per prior progress note, patient did not respond well with IV Ancef and therefore antibiotics changed to IV vancomycin and Zosyn. Patient has nonpurulent erythematous cellulitis in lower extremity. - improving, still in need for IV antibiotics today  - PT to evaluate  Acute on chronic kidney disease stage III  - Serum creatinine level is stable. Continue to monitor. Lasix on hold. Avoid nephrotoxins.  Hypothyroidism  - Continue Synthroid.   DVT prophylaxis: Lovenox Code Status: Full code Family Communication: no family bedside Disposition Plan: home 1-2 days  Consultants:   None   Procedures:   None   Antimicrobials:  Vancomycin / Zosyn 12/2 >>  Ancef 11/29 >> 12/2  Subjective: - no chest pain, shortness of breath, no abdominal pain, nausea or vomiting.  - appreciates that this is the first day when she sees an improvement. Has not walked on left foot yet   Objective: Vitals:   02/04/16 0508 02/04/16 1440 02/04/16 2024 02/05/16 0549  BP: (!) 134/49 134/71 (!) 163/70 (!) 136/56  Pulse: 73 73 90 77  Resp: 16     Temp: 98.4 F (36.9 C) 98.2 F (36.8 C) 98.9 F (37.2 C) 99.3 F (37.4 C)  TempSrc: Oral Oral Oral Oral  SpO2: 98% 98% 99% 99%  Weight:      Height:        Intake/Output Summary (Last 24 hours) at 02/05/16 1101 Last data filed at 02/05/16 [id number]  Gross per 24 hour  Intake              370 ml  Output              700 ml  Net             -330 ml   Filed Weights   01/31/16 2045  Weight: 63.3 kg (139 lb 8 oz)    Examination: Constitutional: NAD, appearing younger than stated age Vitals:   02/04/16 0508 02/04/16 1440 02/04/16 2024 02/05/16 0549  BP: (!) 134/49 134/71 (!) 163/70 (!) 136/56  Pulse: 73 73 90 77  Resp: 16     Temp: 98.4 F (36.9 C) 98.2 F (36.8 C) 98.9 F (37.2 C) 99.3 F (37.4 C)  TempSrc: Oral Oral Oral Oral  SpO2: 98% 98% 99% 99%  Weight:      Height:       Eyes: lids and conjunctivae normal ENMT: Mucous membranes are moist.  Respiratory: clear to auscultation bilaterally, no wheezing, no crackles.  Cardiovascular: Regular rate and rhythm, no murmurs / rubs / gallops. No LE edema.  Abdomen: no tenderness. Bowel sounds positive.  Skin: cellulitis rash left anterior shin, appears to be receeding when compared to prior markings Neurologic: non focal    Data Reviewed: I have personally reviewed following labs and imaging studies  CBC:  Recent Labs Lab 01/31/16 1400  WBC 9.4  NEUTROABS 8.0*  HGB 10.0*  HCT 31.2*  MCV 82.8  PLT 151   Basic Metabolic Panel:  Recent Labs Lab 01/31/16 1400 02/02/16 0430 02/03/16 0609 02/04/16 [id number]  NA 134* 135 136 132*  K 4.1 3.6 3.9 4.0  CL 99* 101 103 100*  CO2 26 25 24 23   GLUCOSE 109* 120* 113* 123*  BUN 36* 32* 28* 24*  CREATININE 1.27* 1.58* 1.48* 1.38*  CALCIUM 9.0 8.2* 8.1* 8.1*   GFR: Estimated Creatinine Clearance: 23.8 mL/min (by C-G formula based on SCr of 1.38 mg/dL (H)). Liver Function Tests:  Recent Labs Lab 01/31/16 1400  AST 31  ALT 16  ALKPHOS 64  BILITOT 0.9  PROT 6.8  ALBUMIN 2.9*   No results for input(s): LIPASE, AMYLASE in the last 168 hours. No results for input(s): AMMONIA in the last 168 hours. Coagulation Profile: No results for input(s): INR, PROTIME in the last 168 hours. Cardiac Enzymes: No results for input(s): CKTOTAL, CKMB, CKMBINDEX, TROPONINI in the last 168 hours. BNP (last 3 results) No results for input(s): PROBNP in  the last 8760 hours. HbA1C: No results for input(s): HGBA1C in the last 72 hours. CBG:  Recent Labs Lab 01/31/16 1402  GLUCAP 101*   Lipid Profile: No results for input(s): CHOL, HDL, LDLCALC, TRIG, CHOLHDL, LDLDIRECT in the last 72 hours. Thyroid Function Tests: No results for input(s): TSH, T4TOTAL, FREET4, T3FREE, THYROIDAB in the last 72 hours. Anemia Panel: No results for input(s): VITAMINB12, FOLATE, FERRITIN, TIBC, IRON, RETICCTPCT in the last 72 hours. Urine analysis:    Component Value Date/Time   COLORURINE YELLOW 05/19/2015 1554   APPEARANCEUR CLEAR 05/19/2015 1554   LABSPEC 1.018 05/19/2015 1554   PHURINE 7.0 05/19/2015 1554   GLUCOSEU NEGATIVE 05/19/2015 1554   HGBUR TRACE (A) 05/19/2015 1554   BILIRUBINUR NEGATIVE 05/19/2015 1554   BILIRUBINUR Pos 02/24/2015 1151   KETONESUR NEGATIVE 05/19/2015 1554   PROTEINUR 30 (A) 05/19/2015 1554   UROBILINOGEN 0.2 02/24/2015 1151   UROBILINOGEN 1.0 08/25/2012 1929   NITRITE NEGATIVE 05/19/2015 1554   LEUKOCYTESUR NEGATIVE 05/19/2015 1554   Sepsis Labs: Invalid input(s): PROCALCITONIN, LACTICIDVEN  Recent Results (from the past 240 hour(s))  Blood culture (routine x 2)     Status: None (Preliminary result)   Collection Time: 01/31/16  5:28 PM  Result Value Ref Range Status   Specimen Description BLOOD RIGHT ANTECUBITAL  Final   Special Requests BOTTLES DRAWN AEROBIC AND ANAEROBIC 5 CC  Final   Culture NO GROWTH 4 DAYS  Final   Report Status PENDING  Incomplete  Blood culture (routine x 2)     Status: None (Preliminary result)   Collection Time: 01/31/16  5:50 PM  Result Value Ref Range Status   Specimen Description BLOOD LEFT ANTECUBITAL  Final   Special Requests BOTTLES DRAWN AEROBIC AND ANAEROBIC 10 CC  Final   Culture NO GROWTH 4 DAYS  Final   Report Status PENDING  Incomplete      Radiology Studies: No results found.   Scheduled Meds: . aspirin EC  81 mg Oral Daily  . enoxaparin (LOVENOX) injection   30 mg Subcutaneous Q24H  . ferrous sulfate  325 mg Oral Daily  . levothyroxine  25 mcg Oral QAC breakfast  . piperacillin-tazobactam (ZOSYN)  IV  3.375 g Intravenous Q8H  . vancomycin  500 mg Intravenous Q24H   Continuous Infusions:   Pamella Pertostin Dilynn Munroe, MD, PhD [facility] Pager [phone]  If 7PM-7AM, please contact night-coverage [email or web address] Password [email or web address] 02/05/2016, 11:01 AM

## 2016-02-06 NOTE — Care Management Note (Addendum)
Case Management Note  Patient Details  Name: Jessica Hawkins MRN: [id number] Date of Birth: 08/21/1922  Subjective/Objective:                    Action/Plan:  Flora LippsPamela Banks' cell phone number is [phone].  Called back 02-06-16 at 1800. She will call today between 0800 and 1700 with choice of home health agency. Ronny FlurryHeather Cinzia Devos RN BSN [phone]  Confirmed with Jessica Hawkins patient does have 24 hour assistance at home. Jessica Hawkins will give my number to family to call me.  Spoke to patient and caregiver Jessica Hawkins at bedside. Jessica Hawkins requesting HHPT/OT patient has 24 hour assistance at home. Confirmed face sheet information with Jessica Hawkins. Provided list of choice, offered to call patient's daughters to discuss home health . Patient stated they are unreachable at present and not to call. Jessica Hawkins stated she will be in contact with Jessica Hawkins this afternoon and provide Jessica Hawkins with the list and call CM back with decision. I left my direct cell number with Jessica Hawkins and patient . Jessica Hawkins also wrote my number in patient's "file " she is keeping.   Patient already has 3 in 1 and walker at home. Expected Discharge Date:                  Expected Discharge Plan:  Home w Home Health Services  In-House Referral:     Discharge planning Services  CM Consult  Post Acute Care Choice:  Home Health Choice offered to:  Patient  DME Arranged:    DME Agency:     HH Arranged:  PT, OT HH Agency:     Status of Service:  In process, will continue to follow  If discussed at Long Length of Stay Meetings, dates discussed:    Additional Comments:  Kingsley PlanWile, Chrystal Zeimet Marie, RN 02/06/2016, 11:35 AM

## 2016-02-06 NOTE — Progress Notes (Addendum)
Physical Therapy Treatment Patient Details Name: Jessica Hawkins MRN: [id number] DOB: 01/21/1923 Today's Date: 02/06/2016    History of Present Illness Patient is a 82 y/o female with hx of incontinence, left TKA, arthritis, vertigo, HTN, HLD, DM, BBB presents with LLE cellulitis failed as outpatient.    PT Comments    Pt with the above diagnosis. Pt required mod assist to transfer from sit<>stand today as she demonstrated significant posterior and decreased stability with getting OOB today. Pt was assisted to bathroom and required assist to sit down and stand up from toilet as well as with pulling up her underwear, and wiping herself. Pt would benefit from OT consult to address self care as pt was independent with these tasks PTA. Pt declined to ambulate further today 2/2 fatigue. Pt reported that she will not have 24/7 care at home, since her son works the night shift and will need to sleep during the day. However, pt's personal care attendant, who came later in the day, stated that she will have CNAs and her daughters available for 24/7 care at home. Pt continues to be a good candidate for HHPT, HHOT, and HH Aide at this time to maximize strength and mobility gains. Discussed recommendation with MD regarding SNF earlier but once confirmed that pt does have 24 hour care, HH is appropriate.     Follow Up Recommendations  Home health PT;Supervision/Assistance - 24 hour (HHOT, HHAide)     Equipment Recommendations  None recommended by PT    Recommendations for Other Services OT consult     Precautions / Restrictions Precautions Precautions: Fall Restrictions Weight Bearing Restrictions: No    Mobility  Bed Mobility Overal bed mobility: Needs Assistance Bed Mobility: Supine to Sit     Supine to sit: Min guard;HOB elevated     General bed mobility comments: Increased time but no assist needed. Use of rail.  Transfers Overall transfer level: Needs assistance Equipment used:  Rolling walker (2 wheeled) Transfers: Sit to/from Stand Sit to Stand: Mod assist         General transfer comment: significant posterior lean upon standing; assist to power to standing.  Stood from Orthoptistchair x1, from toilet x1  Ambulation/Gait Ambulation/Gait assistance: Min guard Ambulation Distance (Feet): 20 Feet Assistive device: Rolling walker (2 wheeled) Gait Pattern/deviations: Step-through pattern;Decreased stride length;Trunk flexed;Decreased stance time - left Gait velocity: decreased   General Gait Details: Slow, mostly steady gait using RW. Cues for upright and forward gaze.    Stairs            Wheelchair Mobility    Modified Rankin (Stroke Patients Only)       Balance Overall balance assessment: Needs assistance Sitting-balance support: Feet supported;No upper extremity supported Sitting balance-Leahy Scale: Fair Sitting balance - Comments: able to sit on toilet without physical assist   Standing balance support: Bilateral upper extremity supported;During functional activity Standing balance-Leahy Scale: Poor Standing balance comment: Reliant on BUEs for support; requires assist to donn underwear and clean herself after use of bathroom                    Cognition Arousal/Alertness: Awake/alert Behavior During Therapy: WFL for tasks assessed/performed Overall Cognitive Status: History of cognitive impairments - at baseline                      Exercises      General Comments        Pertinent Vitals/Pain Pain Assessment: No/denies pain  Home Living                      Prior Function            PT Goals (current goals can now be found in the care plan section) Acute Rehab PT Goals Patient Stated Goal: to get stronger so I can get around at home Progress towards PT goals: Progressing toward goals    Frequency    Min 3X/week      PT Plan Discharge plan needs to be updated    Co-evaluation              End of Session Equipment Utilized During Treatment: Gait belt Activity Tolerance: Patient limited by fatigue Patient left: in bed;with call bell/phone within reach     Time: 0900-0944 PT Time Calculation (min) (ACUTE ONLY): 44 min  Charges:  $Gait Training: 8-22 mins $Therapeutic Activity: 8-22 mins $Self Care/Home Management: 8-22                    G Codes:      Jessica Hawkins 02/06/2016, 12:23 PM Jessica Hawkins, Jessica Hawkins [phone] I have read, reviewed and agree with student's note.   [facility] San MarcosDawn Sneijder Bernards,PT Acute Rehabilitation [phone] (pager)

## 2016-02-06 NOTE — Consult Note (Signed)
CSW consult for SNF placement. Per chart review, plan is for home with 24 hour assistance and HH. [facility] CSW signing off. Please re-consult if CSW needs arise.   Dellie BurnsJosie Naviah Belfield, MSW, LCSW [phone] (coverage)

## 2016-02-06 NOTE — Progress Notes (Addendum)
PROGRESS NOTE  Jessica Hawkins G Meiring ZOX:[id number] DOB: 08/25/1922 DOA: 01/31/2016 PCP: Murray HodgkinsArthur Green, MD   LOS: 6 days   Brief Narrative: 82 y/o female with recurrent left leg cellulitis. Patient has failed outpatient treatment for left leg cellulitis x2with doxycycline. She is improving with IV antibiotics   Assessment & Plan: Principal Problem:   Cellulitis of left anterior lower leg   Cellulitis of left anterior lower leg - Doppler ultrasound negative for DVT. Reportedly patient failed outpatient oral antibiotics. As per prior progress note, patient did not respond well with IV Ancef and therefore antibiotics changed to IV vancomycin and Zosyn on 12/2 - Patient has nonpurulent erythematous cellulitis in lower extremity - improving, still in need for IV antibiotics today, if continues at the current rate of improvement may be able to switch to by mouth antibiotics in 1-2 days - PT evaluated patient, she will likely require SNF, patient is agreeable, consulted social worker  Acute on chronic kidney disease stage III  - Serum creatinine level is stable. Continue to monitor. Lasix on hold. Avoid nephrotoxins.  Hypothyroidism  - Continue Synthroid.   DVT prophylaxis: Lovenox Code Status: Full code Family Communication: d/w the "family liason" bedside  Disposition Plan: home 1-2 days   Consultants:   None   Procedures:   None   Antimicrobials:  Vancomycin / Zosyn 12/2 >>  Ancef 11/29 >> 12/2  Subjective: - appreciates improvement in her swelling and erythema   Objective: Vitals:   02/05/16 0549 02/05/16 1607 02/05/16 1953 02/06/16 0650  BP: (!) 136/56 (!) 146/60 113/60 135/61  Pulse: 77 81 82 88  Resp:  16 18 18   Temp: 99.3 F (37.4 C) 98.6 F (37 C) 98.6 F (37 C) 98.3 F (36.8 C)  TempSrc: Oral Oral Oral Oral  SpO2: 99% 100% 100% 100%  Weight:      Height:        Intake/Output Summary (Last 24 hours) at 02/06/16 1218 Last data filed at 02/06/16 0859  Gross per 24 hour  Intake              120 ml  Output                0 ml  Net              120 ml   Filed Weights   01/31/16 2045  Weight: 63.3 kg (139 lb 8 oz)    Examination: Constitutional: NAD, appearing younger than stated age Vitals:   02/05/16 0549 02/05/16 1607 02/05/16 1953 02/06/16 0650  BP: (!) 136/56 (!) 146/60 113/60 135/61  Pulse: 77 81 82 88  Resp:  16 18 18   Temp: 99.3 F (37.4 C) 98.6 F (37 C) 98.6 F (37 C) 98.3 F (36.8 C)  TempSrc: Oral Oral Oral Oral  SpO2: 99% 100% 100% 100%  Weight:      Height:       Eyes: lids and conjunctivae normal ENMT: Mucous membranes are moist.  Respiratory: clear to auscultation bilaterally, no wheezing, no crackles.  Cardiovascular: Regular rate and rhythm, no murmurs / rubs / gallops. No LE edema.  Abdomen: no tenderness. Bowel sounds positive.  Skin: cellulitis rash left anterior shin, appears to be receeding when compared to prior markings Neurologic: non focal    Data Reviewed: I have personally reviewed following labs and imaging studies  CBC:  Recent Labs Lab 01/31/16 1400  WBC 9.4  NEUTROABS 8.0*  HGB 10.0*  HCT 31.2*  MCV 82.8  PLT 151   Basic Metabolic Panel:  Recent Labs Lab 01/31/16 1400 02/02/16 0430 02/03/16 0609 02/04/16 0312  NA 134* 135 136 132*  K 4.1 3.6 3.9 4.0  CL 99* 101 103 100*  CO2 26 25 24 23   GLUCOSE 109* 120* 113* 123*  BUN 36* 32* 28* 24*  CREATININE 1.27* 1.58* 1.48* 1.38*  CALCIUM 9.0 8.2* 8.1* 8.1*   GFR: Estimated Creatinine Clearance: 23.8 mL/min (by C-G formula based on SCr of 1.38 mg/dL (H)). Liver Function Tests:  Recent Labs Lab 01/31/16 1400  AST 31  ALT 16  ALKPHOS 64  BILITOT 0.9  PROT 6.8  ALBUMIN 2.9*   CBG:  Recent Labs Lab 01/31/16 1402  GLUCAP 101*   Urine analysis:    Component Value Date/Time   COLORURINE YELLOW 05/19/2015 1554   APPEARANCEUR CLEAR 05/19/2015 1554   LABSPEC 1.018 05/19/2015 1554   PHURINE 7.0 05/19/2015 1554     GLUCOSEU NEGATIVE 05/19/2015 1554   HGBUR TRACE (A) 05/19/2015 1554   BILIRUBINUR NEGATIVE 05/19/2015 1554   BILIRUBINUR Pos 02/24/2015 1151   KETONESUR NEGATIVE 05/19/2015 1554   PROTEINUR 30 (A) 05/19/2015 1554   UROBILINOGEN 0.2 02/24/2015 1151   UROBILINOGEN 1.0 08/25/2012 1929   NITRITE NEGATIVE 05/19/2015 1554   LEUKOCYTESUR NEGATIVE 05/19/2015 1554   Sepsis Labs: Invalid input(s): PROCALCITONIN, LACTICIDVEN  Recent Results (from the past 240 hour(s))  Blood culture (routine x 2)     Status: None   Collection Time: 01/31/16  5:28 PM  Result Value Ref Range Status   Specimen Description BLOOD RIGHT ANTECUBITAL  Final   Special Requests BOTTLES DRAWN AEROBIC AND ANAEROBIC 5 CC  Final   Culture NO GROWTH 5 DAYS  Final   Report Status 02/05/2016 FINAL  Final  Blood culture (routine x 2)     Status: None   Collection Time: 01/31/16  5:50 PM  Result Value Ref Range Status   Specimen Description BLOOD LEFT ANTECUBITAL  Final   Special Requests BOTTLES DRAWN AEROBIC AND ANAEROBIC 10 CC  Final   Culture NO GROWTH 5 DAYS  Final   Report Status 02/05/2016 FINAL  Final     Radiology Studies: No results found.  Scheduled Meds: . aspirin EC  81 mg Oral Daily  . enoxaparin (LOVENOX) injection  30 mg Subcutaneous Q24H  . ferrous sulfate  325 mg Oral Daily  . levothyroxine  25 mcg Oral QAC breakfast  . piperacillin-tazobactam (ZOSYN)  IV  3.375 g Intravenous Q8H  . vancomycin  500 mg Intravenous Q24H   Continuous Infusions:  Time spent: 25 minutes, more than 50% bedside discussing with family   Pamella Pertostin Keara Pagliarulo, MD, PhD [facility] Pager [phone]  If 7PM-7AM, please contact night-coverage [email or web address] Password [email or web address] 02/06/2016, 12:18 PM

## 2016-02-06 NOTE — Progress Notes (Signed)
Pharmacy Antibiotic Note  Jessica Hawkins is a 82 y.o. female admitted on 01/31/2016 with left leg cellulitis that did not respond to outpatient doxycycline.  Patient was started on Ancef on admission and broadened to vancomycin and Zosyn because she was not improving.    Patient's renal function is relatively stable.  She is afebrile and her WBC is WNL.   Plan: - Continue vanc 500mg  IV Q24H - Zosyn 3.375gm IV Q8H, 4 hr infusion - Monitor renal fxn, clinical progress, vanc trough soon if not yet de-escalated   Height: 5\' 3"  (160 cm) Weight: 139 lb 8 oz (63.3 kg) IBW/kg (Calculated) : 52.4  Temp (24hrs), Avg:98.5 F (36.9 C), Min:98.3 F (36.8 C), Max:98.6 F (37 C)   Recent Labs Lab 01/31/16 1400 01/31/16 1436 01/31/16 1740 02/02/16 0430 02/03/16 0609 02/04/16 0312  WBC 9.4  --   --   --   --   --   CREATININE 1.27*  --   --  1.58* 1.48* 1.38*  LATICACIDVEN  --  1.28 1.12  --   --   --     Estimated Creatinine Clearance: 23.8 mL/min (by C-G formula based on SCr of 1.38 mg/dL (H)).    No Known Allergies   Antimicrobials this admission:  Ancef 11/29 >> 12/2 Vanc 12/2 >> Zosyn 12/2 >> Doxy PTA  Dose adjustments this admission:  N/A  Microbiology results:  11/29 BCx x2 - negative   Jessica Terral D. Laney Potashang, PharmD, BCPS Pager:  [phone] 02/06/2016, 12:53 PM

## 2016-02-07 ENCOUNTER — Inpatient Hospital Stay (HOSPITAL_COMMUNITY): Payer: Medicare Other

## 2016-02-07 DIAGNOSIS — L03116 Cellulitis of left lower limb: Principal | ICD-10-CM

## 2016-02-07 LAB — CBC
HEMATOCRIT: 24.6 % — AB (ref 36.0–46.0)
Hemoglobin: 7.9 g/dL — ABNORMAL LOW (ref 12.0–15.0)
MCH: 26.2 pg (ref 26.0–34.0)
MCHC: 32.1 g/dL (ref 30.0–36.0)
MCV: 81.7 fL (ref 78.0–100.0)
Platelets: 289 10*3/uL (ref 150–400)
RBC: 3.01 MIL/uL — AB (ref 3.87–5.11)
RDW: 15.3 % (ref 11.5–15.5)
WBC: 6.7 10*3/uL (ref 4.0–10.5)

## 2016-02-07 LAB — BASIC METABOLIC PANEL
ANION GAP: 10 (ref 5–15)
BUN: 22 mg/dL — ABNORMAL HIGH (ref 6–20)
CALCIUM: 8.3 mg/dL — AB (ref 8.9–10.3)
CO2: 22 mmol/L (ref 22–32)
Chloride: 106 mmol/L (ref 101–111)
Creatinine, Ser: 1.46 mg/dL — ABNORMAL HIGH (ref 0.44–1.00)
GFR, EST AFRICAN AMERICAN: 35 mL/min — AB (ref >60)
GFR, EST NON AFRICAN AMERICAN: 30 mL/min — AB (ref >60)
Glucose, Bld: 110 mg/dL — ABNORMAL HIGH (ref 65–99)
POTASSIUM: 3.9 mmol/L (ref 3.5–5.1)
Sodium: 138 mmol/L (ref 135–145)

## 2016-02-07 LAB — C-REACTIVE PROTEIN: CRP: 11.1 mg/dL — AB (ref <1.0)

## 2016-02-07 LAB — MRSA PCR SCREENING: MRSA by PCR: NEGATIVE

## 2016-02-07 LAB — SEDIMENTATION RATE: SED RATE: 128 mm/h — AB (ref 0–22)

## 2016-02-07 LAB — VANCOMYCIN, TROUGH: Vancomycin Tr: 14 ug/mL — ABNORMAL LOW (ref 15–20)

## 2016-02-07 LAB — CK: Total CK: 24 U/L — ABNORMAL LOW (ref 38–234)

## 2016-02-07 MED ORDER — FUROSEMIDE 20 MG PO TABS
20.0000 mg | ORAL_TABLET | Freq: Every day | ORAL | Status: DC
  Administered 2016-02-07 – 2016-02-08 (×2): 20 mg via ORAL
  Filled 2016-02-07 (×2): qty 1

## 2016-02-07 MED ORDER — SACCHAROMYCES BOULARDII 250 MG PO CAPS
250.0000 mg | ORAL_CAPSULE | Freq: Two times a day (BID) | ORAL | Status: DC
  Administered 2016-02-07 – 2016-02-08 (×3): 250 mg via ORAL
  Filled 2016-02-07 (×3): qty 1

## 2016-02-07 MED ORDER — CLINDAMYCIN PHOSPHATE 600 MG/50ML IV SOLN
600.0000 mg | Freq: Three times a day (TID) | INTRAVENOUS | Status: DC
  Administered 2016-02-07 – 2016-02-08 (×4): 600 mg via INTRAVENOUS
  Filled 2016-02-07 (×6): qty 50

## 2016-02-07 NOTE — Progress Notes (Signed)
PROGRESS NOTE  Jessica Hawkins ION:[id number] DOB: 03-27-1922 DOA: 01/31/2016 PCP: Murray HodgkinsArthur Green, MD   LOS: 7 days   Brief Narrative: 82 y/o female with recurrent left leg cellulitis. Patient has failed outpatient treatment for left leg cellulitis x2with doxycycline. She is improving with IV antibiotics   Assessment & Plan: Principal Problem:   Cellulitis of left anterior lower leg   Cellulitis of left anterior lower leg - Doppler ultrasound negative for DVT. Reportedly patient failed outpatient oral antibiotics. As per prior progress note, patient did not respond well with IV Ancef and therefore antibiotics changed to IV vancomycin and Zosyn on 12/2 - Patient has nonpurulent erythematous cellulitis in lower extremity - PT evaluated patient, she will likely require SNF, patient is agreeable, consulted social worker If the patient does not show any improvement we'll discuss with ID regarding further antibiotic course. Currently transitioned to clindamycin. Check ABI is*CRP CPK.  Acute on chronic kidney disease stage III  - Serum creatinine level is stable. Continue to monitor. Lasix on hold. Avoid nephrotoxins.  Hypothyroidism  - Continue Synthroid.   DVT prophylaxis: Lovenox Code Status: Full code Family Communication: d/w the "family  Disposition Plan: SNF 1-2 days   Consultants:   None   Procedures:   None   Antimicrobials:  Vancomycin / Zosyn 12/2 >>  Ancef 11/29 >> 12/2  Subjective: Feeling better but not completely back to baseline. Complains about pain in the leg on ambulation.  Objective: Vitals:   02/06/16 1558 02/06/16 2144 02/07/16 0518 02/07/16 1438  BP: (!) 147/65 128/64 132/63 (!) 145/53  Pulse: 80 81 84 83  Resp:  17 16 17   Temp: 98.4 F (36.9 C) 98.3 F (36.8 C) 98.2 F (36.8 C) 98 F (36.7 C)  TempSrc: Oral Oral Oral Oral  SpO2: 98% 99% 98% 100%  Weight:      Height:        Intake/Output Summary (Last 24 hours) at 02/07/16  1856 Last data filed at 02/07/16 1439  Gross per 24 hour  Intake              670 ml  Output                0 ml  Net              670 ml   Filed Weights   01/31/16 2045  Weight: 63.3 kg (139 lb 8 oz)    Examination: Constitutional: NAD, appearing younger than stated age Vitals:   02/06/16 1558 02/06/16 2144 02/07/16 0518 02/07/16 1438  BP: (!) 147/65 128/64 132/63 (!) 145/53  Pulse: 80 81 84 83  Resp:  17 16 17   Temp: 98.4 F (36.9 C) 98.3 F (36.8 C) 98.2 F (36.8 C) 98 F (36.7 C)  TempSrc: Oral Oral Oral Oral  SpO2: 98% 99% 98% 100%  Weight:      Height:       Eyes: lids and conjunctivae normal ENMT: Mucous membranes are moist.  Respiratory: clear to auscultation bilaterally, no wheezing, no crackles.  Cardiovascular: Regular rate and rhythm, no murmurs / rubs / gallops. No LE edema.  Abdomen: no tenderness. Bowel sounds positive.  Skin: cellulitis rash left anterior shin, appears to be receeding when compared to prior markings Neurologic: non focal    Data Reviewed: I have personally reviewed following labs and imaging studies  CBC:  Recent Labs Lab 02/07/16 0612  WBC 6.7  HGB 7.9*  HCT 24.6*  MCV 81.7  PLT 289  Basic Metabolic Panel:  Recent Labs Lab 02/02/16 0430 02/03/16 0609 02/04/16 0312 02/07/16 0612  NA 135 136 132* 138  K 3.6 3.9 4.0 3.9  CL 101 103 100* 106  CO2 25 24 23 22   GLUCOSE 120* 113* 123* 110*  BUN 32* 28* 24* 22*  CREATININE 1.58* 1.48* 1.38* 1.46*  CALCIUM 8.2* 8.1* 8.1* 8.3*   GFR: Estimated Creatinine Clearance: 22.5 mL/min (by C-G formula based on SCr of 1.46 mg/dL (H)). Liver Function Tests: No results for input(s): AST, ALT, ALKPHOS, BILITOT, PROT, ALBUMIN in the last 168 hours. CBG: No results for input(s): GLUCAP in the last 168 hours. Urine analysis:    Component Value Date/Time   COLORURINE YELLOW 05/19/2015 1554   APPEARANCEUR CLEAR 05/19/2015 1554   LABSPEC 1.018 05/19/2015 1554   PHURINE 7.0  05/19/2015 1554   GLUCOSEU NEGATIVE 05/19/2015 1554   HGBUR TRACE (A) 05/19/2015 1554   BILIRUBINUR NEGATIVE 05/19/2015 1554   BILIRUBINUR Pos 02/24/2015 1151   KETONESUR NEGATIVE 05/19/2015 1554   PROTEINUR 30 (A) 05/19/2015 1554   UROBILINOGEN 0.2 02/24/2015 1151   UROBILINOGEN 1.0 08/25/2012 1929   NITRITE NEGATIVE 05/19/2015 1554   LEUKOCYTESUR NEGATIVE 05/19/2015 1554   Sepsis Labs: Invalid input(s): PROCALCITONIN, LACTICIDVEN  Recent Results (from the past 240 hour(s))  Blood culture (routine x 2)     Status: None   Collection Time: 01/31/16  5:28 PM  Result Value Ref Range Status   Specimen Description BLOOD RIGHT ANTECUBITAL  Final   Special Requests BOTTLES DRAWN AEROBIC AND ANAEROBIC 5 CC  Final   Culture NO GROWTH 5 DAYS  Final   Report Status 02/05/2016 FINAL  Final  Blood culture (routine x 2)     Status: None   Collection Time: 01/31/16  5:50 PM  Result Value Ref Range Status   Specimen Description BLOOD LEFT ANTECUBITAL  Final   Special Requests BOTTLES DRAWN AEROBIC AND ANAEROBIC 10 CC  Final   Culture NO GROWTH 5 DAYS  Final   Report Status 02/05/2016 FINAL  Final  MRSA PCR Screening     Status: None   Collection Time: 02/07/16 12:20 PM  Result Value Ref Range Status   MRSA by PCR NEGATIVE NEGATIVE Final    Comment:        The GeneXpert MRSA Assay (FDA approved for NASAL specimens only), is one component of a comprehensive MRSA colonization surveillance program. It is not intended to diagnose MRSA infection nor to guide or monitor treatment for MRSA infections.      Radiology Studies: Dg Knee Complete 4 Views Left  Result Date: 02/07/2016 CLINICAL DATA:  Initial evaluation for 1 week history of left knee pain with swelling. EXAM: LEFT KNEE - COMPLETE 4+ VIEW COMPARISON:  Prior radiograph from 01/31/2016. FINDINGS: Distal aspect of the an intra medullary fixation rod with interlocking screw present within the distal femur. Left total knee arthroplasty  in place. No hardware complication. No acute fracture or dislocation. No appreciable joint effusion. Mild diffuse soft tissue swelling about the knee. IMPRESSION: 1. No acute osseous abnormality about the left knee. 2. Sequela prior left total knee arthroplasty as well as IM nail fixation of left femur. No hardware complication. 3. Mild diffuse soft tissue swelling about the knee. Electronically Signed   By: Rise MuBenjamin  McClintock M.D.   On: 02/07/2016 16:31    Scheduled Meds: . aspirin EC  81 mg Oral Daily  . clindamycin (CLEOCIN) IV  600 mg Intravenous Q8H  . enoxaparin (LOVENOX) injection  30 mg Subcutaneous Q24H  . ferrous sulfate  325 mg Oral Daily  . furosemide  20 mg Oral Daily  . levothyroxine  25 mcg Oral QAC breakfast  . saccharomyces boulardii  250 mg Oral BID   Continuous Infusions:  Time spent: 25 minutes, more than 50% bedside discussing with family  Author: Lynden Oxford, MD [facility] 02/07/2016 6:57 PM    If 7PM-7AM, please contact night-coverage [email or web address] Password [facility] 02/07/2016, 6:56 PM

## 2016-02-07 NOTE — Care Management (Signed)
Jessica Hawkins' cell phone number is [phone], called back today , has not made decision on home health agency. Confirmed again with Ms Jessica Hawkins , patient will have 24 hour supervision at home. Ronny FlurryHeather Guinevere Stephenson RN BSN [phone]

## 2016-02-07 NOTE — Progress Notes (Signed)
Pharmacy Antibiotic Note  Jessica Hawkins is a 82 y.o. female admitted on 01/31/2016 with left leg cellulitis that did not respond to outpatient doxycycline.  Patient was started on Ancef on admission and broadened to vancomycin and Zosyn because she was not improving.    Patient's renal function is relatively stable.  She is afebrile and her WBC is WNL.   Vancomycin trough level is 14 - therapeutic for goal of 10 - 15 mcg/mL.  Day #5 of therapy  Plan: - Continue vanc 500mg  IV Q24H - Zosyn 3.375gm IV Q8H, 4 hr infusion - Monitor renal fxn, clinical progress - Follow-up duration of therapy   Height: 5\' 3"  (160 cm) Weight: 139 lb 8 oz (63.3 kg) IBW/kg (Calculated) : 52.4  Temp (24hrs), Avg:98.2 F (36.8 C), Min:98 F (36.7 C), Max:98.4 F (36.9 C)   Recent Labs Lab 01/31/16 1740 02/02/16 0430 02/03/16 0609 02/04/16 0312 02/07/16 0612 02/07/16 1417  WBC  --   --   --   --  6.7  --   CREATININE  --  1.58* 1.48* 1.38* 1.46*  --   LATICACIDVEN 1.12  --   --   --   --   --   VANCOTROUGH  --   --   --   --   --  14*    Estimated Creatinine Clearance: 22.5 mL/min (by C-G formula based on SCr of 1.46 mg/dL (H)).    No Known Allergies   Antimicrobials this admission:  Ancef 11/29 >> 12/2 Vanc 12/2 >> Zosyn 12/2 >> Doxy PTA  Dose adjustments this admission:  N/A  Microbiology results:  11/29 BCx x2 - negative 12/6 MRSA PCR - negative  Jessica Hawkins, PharmD, BCPS Clinical Pharmacist [phone] until 4PM today [phone] after hours 02/07/2016, 3:49 PM

## 2016-02-07 NOTE — Consult Note (Signed)
[facility] CM Primary Care Navigator  02/07/2016  Jessica Hawkins 03-21-1922 [id number]  Met with patient at the bedside to identify possible discharge needs. Patient reports having swelling, itching and increased pain to left leg that led to this admission.  Patient endorses Dr. Jeanmarie Hubert with [facility] as her primary care provider.    Patient shared using [facility] [place] to obtain medications without any problem.   Patient reports managing her medications at home with her daughter's assistance using "calendar" system.   Son Purcell Nails) or daughter [name]) provides transportation to her doctors' appointments.  Patient states she lives with son and he is the primary caregiver at home.   Discharge plan is home with home health services. Per Inpatient CM note, patient will have 24 hour supervision at home  Patient voiced understanding to call primary care provider's office when she returns home, for a post discharge follow-up appointment within a week or sooner if needed. Patient letter provided as a reminder.  Patient reports she is diabetic, however it is managed and currently not taking any medications for it. Recent A1c is 5.9. She denies any needs or concerns at this time. Primary [facility] care management contact information given for any future needs that may arise.   For additional questions please contact:  Edwena Felty A. Masiyah Jorstad, BSN, RN-BC [facility] PRIMARY CARE Navigator Cell: [phone]

## 2016-02-07 NOTE — Progress Notes (Signed)
Pt left floor for xray with [employer] [employer].

## 2016-02-07 NOTE — Progress Notes (Signed)
Family asking to speak with doctor for updates. Family asking for MD to contact Ihor DowPamela Steinberg [phone]. MD paged.

## 2016-02-08 ENCOUNTER — Inpatient Hospital Stay (HOSPITAL_COMMUNITY): Payer: Medicare Other

## 2016-02-08 DIAGNOSIS — M6281 Muscle weakness (generalized): Secondary | ICD-10-CM | POA: Diagnosis not present

## 2016-02-08 DIAGNOSIS — N189 Chronic kidney disease, unspecified: Secondary | ICD-10-CM | POA: Diagnosis not present

## 2016-02-08 DIAGNOSIS — R2689 Other abnormalities of gait and mobility: Secondary | ICD-10-CM | POA: Diagnosis not present

## 2016-02-08 DIAGNOSIS — E1129 Type 2 diabetes mellitus with other diabetic kidney complication: Secondary | ICD-10-CM | POA: Diagnosis not present

## 2016-02-08 DIAGNOSIS — R279 Unspecified lack of coordination: Secondary | ICD-10-CM | POA: Diagnosis not present

## 2016-02-08 DIAGNOSIS — D6489 Other specified anemias: Secondary | ICD-10-CM | POA: Diagnosis not present

## 2016-02-08 DIAGNOSIS — L03116 Cellulitis of left lower limb: Secondary | ICD-10-CM | POA: Diagnosis not present

## 2016-02-08 DIAGNOSIS — R41841 Cognitive communication deficit: Secondary | ICD-10-CM | POA: Diagnosis not present

## 2016-02-08 DIAGNOSIS — M7989 Other specified soft tissue disorders: Secondary | ICD-10-CM

## 2016-02-08 DIAGNOSIS — R5381 Other malaise: Secondary | ICD-10-CM | POA: Diagnosis not present

## 2016-02-08 DIAGNOSIS — R600 Localized edema: Secondary | ICD-10-CM | POA: Diagnosis not present

## 2016-02-08 DIAGNOSIS — M25569 Pain in unspecified knee: Secondary | ICD-10-CM | POA: Diagnosis not present

## 2016-02-08 DIAGNOSIS — N186 End stage renal disease: Secondary | ICD-10-CM | POA: Diagnosis not present

## 2016-02-08 LAB — CBC WITH DIFFERENTIAL/PLATELET
BASOS ABS: 0 10*3/uL (ref 0.0–0.1)
BASOS PCT: 1 %
Eosinophils Absolute: 0.2 10*3/uL (ref 0.0–0.7)
Eosinophils Relative: 3 %
HEMATOCRIT: 23.8 % — AB (ref 36.0–46.0)
HEMOGLOBIN: 7.6 g/dL — AB (ref 12.0–15.0)
Lymphocytes Relative: 25 %
Lymphs Abs: 1.7 10*3/uL (ref 0.7–4.0)
MCH: 26.1 pg (ref 26.0–34.0)
MCHC: 31.9 g/dL (ref 30.0–36.0)
MCV: 81.8 fL (ref 78.0–100.0)
MONOS PCT: 9 %
Monocytes Absolute: 0.6 10*3/uL (ref 0.1–1.0)
NEUTROS ABS: 4.1 10*3/uL (ref 1.7–7.7)
NEUTROS PCT: 62 %
Platelets: 324 10*3/uL (ref 150–400)
RBC: 2.91 MIL/uL — ABNORMAL LOW (ref 3.87–5.11)
RDW: 15.2 % (ref 11.5–15.5)
WBC: 6.6 10*3/uL (ref 4.0–10.5)

## 2016-02-08 LAB — TYPE AND SCREEN
ABO/RH(D): O POS
ANTIBODY SCREEN: NEGATIVE

## 2016-02-08 MED ORDER — CLINDAMYCIN HCL 300 MG PO CAPS: 600.0000 mg | ORAL_CAPSULE | Freq: Three times a day (TID) | ORAL | 0 refills | Status: AC

## 2016-02-08 MED ORDER — FOLIC ACID 1 MG PO TABS
1.0000 mg | ORAL_TABLET | Freq: Every day | ORAL | Status: DC
  Administered 2016-02-08: 1 mg via ORAL
  Filled 2016-02-08: qty 1

## 2016-02-08 MED ORDER — FERROUS SULFATE 325 (65 FE) MG PO TABS: 325.0000 mg | ORAL_TABLET | Freq: Three times a day (TID) | ORAL | 0 refills | Status: DC

## 2016-02-08 MED ORDER — CLINDAMYCIN HCL 300 MG PO CAPS: 600.0000 mg | ORAL_CAPSULE | Freq: Three times a day (TID) | ORAL | 0 refills | Status: DC

## 2016-02-08 MED ORDER — FOLIC ACID 1 MG PO TABS: 1.0000 mg | ORAL_TABLET | Freq: Every day | ORAL | 0 refills | Status: DC

## 2016-02-08 MED ORDER — SACCHAROMYCES BOULARDII 250 MG PO CAPS: 250.0000 mg | ORAL_CAPSULE | Freq: Two times a day (BID) | ORAL | 0 refills | Status: DC

## 2016-02-08 MED ORDER — FERROUS SULFATE 325 (65 FE) MG PO TABS
325.0000 mg | ORAL_TABLET | Freq: Three times a day (TID) | ORAL | Status: DC
  Administered 2016-02-08: 325 mg via ORAL
  Filled 2016-02-08: qty 1

## 2016-02-08 NOTE — Progress Notes (Addendum)
1:32 PM Spoke with daughter [name] who was frustrated and felt hospital was kicking patient out with no plan or time to confirm SNF. LCSW explained many days of planned DC of being home and change as of last night for SNF as patient was nearing DC and improving with medications as she has been made aware. She confirms and voices fear in transitioning patient next steps.  LCSW provided support and thourough clarification of SNF and care. [facility] has [facility]. All clinicals have been sent to facility. Family is adamant about taking patient by car. Awaiting daughter to call back and confirm she will be here to pick patient up or sending another family member to pick patient up as [place] in [place].  Plan: SNF [facility]   LCSW received message that family has changed their minds regarding discharge plan. Daughter is requesting ST SNF: at [facility].  LCSW has worked patient up and sent referral to [facility]. Call placed to heather in admission in effort to review with possible admission.  (reviewed referral and in agreement with plan to DC today, would like family at SNF as early as possible). [facility]. Confirmed with MD that plan will be DC to SNF today.    Will follow up with family and CM regarding plans for DC and if bed available. Deretha EmoryHannah Merrill Deanda LCSW, MSW Clinical Social Work: Optician, dispensingystem [employer] Coverage for :  [phone]

## 2016-02-08 NOTE — Progress Notes (Signed)
PT Cancellation Note  Patient Details Name: Gerhard Munchauline G Barnhill MRN: [id number] DOB: 17-Sep-1922   Cancelled Treatment:    Reason Eval/Treat Not Completed: Patient at procedure or test/unavailable Pt leaving for procedure on PT arrival. Will try to see as time allows.  Gaye PollackRebecca Sinai Mahany 02/08/2016, 10:36 AM Gaye Pollackebecca Debora Stockdale, SPT [phone]

## 2016-02-08 NOTE — Progress Notes (Signed)
VASCULAR LAB PRELIMINARY  ARTERIAL  ABI completed:    RIGHT    LEFT    PRESSURE WAVEFORM  PRESSURE WAVEFORM  BRACHIAL 155 Triphasic BRACHIAL 150 Triphasic  DP 169 Triphasic DP 160 Triphasic  AT   AT    PT 189 Triphasic PT 173 Triphasic  PER   PER    GREAT TOE  NA GREAT TOE  NA    RIGHT LEFT  ABI 1.22 1.12   Bilateral ABIs are within normal limits at rest.  02/08/2016 10:55 AM Gertie FeyMichelle Romari Gasparro, BS, RVT, RDCS, RDMS

## 2016-02-08 NOTE — Care Management (Signed)
Called patient's daughter Flora Lippsamela Banks , she now wants her mother to go to [facility] [place] . Paged MD and SW.  Ronny FlurryHeather Cecile Guevara RN BSN [phone]

## 2016-02-08 NOTE — [place] FL2 (Signed)
[place] MEDICAID FL2 LEVEL OF CARE SCREENING TOOL     IDENTIFICATION  Patient Name: Jessica Hawkins Birthdate: 1922-12-09 Sex: female Admission Date (Current Location): 01/31/2016  [facility] [place]   Facility and Address:  [facility], [address], [place], [place] [address]      Provider Number: [id number]  Attending Physician Name and Address:  Rolly SalterPranav M Patel, MD  Relative Name and Phone Number:       Current Level of Care: Hospital Recommended Level of Care: Skilled Nursing Facility Prior Approval Number:    Date Approved/Denied:   PASRR Number: [id number]  Discharge Plan: SNF    Current Diagnoses: Patient Active Problem List   Diagnosis Date Noted  . Cellulitis of left anterior lower leg 01/31/2016  . Anemia 12/20/2015  . Cellulitis and abscess of leg 12/20/2015  . LLQ abdominal pain 12/06/2015  . Self-care deficit in patient living alone 11/08/2015  . Peripheral nerve contusion 08/15/2015  . Foot drop, left 07/18/2015  . S/P total knee arthroplasty 05/29/2015  . Bleeding disorder (HCC) 05/22/2015  . Primary osteoarthritis of both knees 05/04/2015  . Nonspecific vaginitis 04/11/2015  . Recurrent UTI 03/07/2015  . Joint mouse 03/07/2015  . Right shoulder pain 01/18/2015  . Pre-operative cardiovascular examination 12/07/2014  . Loss of weight 10/13/2013  . Constipation 10/06/2013  . Venous insufficiency (chronic) (peripheral) 02/02/2013  . Knee pain 12/16/2012  . Arthritis of knee, degenerative 10/29/2012  . Edema 07/23/2012  . Abnormality of gait 07/23/2012  . Urinary incontinence 07/23/2012  . Hyperlipidemia 07/23/2012  . Controlled type 2 DM with peripheral circulatory disorder (HCC) 07/23/2012  . GERD (gastroesophageal reflux disease) 06/29/2011  . HTN (hypertension) 06/29/2011  . Intertrochanteric fracture of left femur (HCC) 06/29/2011  . Arthritis 06/29/2011  . Hypothyroid 06/29/2011     Orientation RESPIRATION BLADDER Height & Weight     Self, Time, Situation, Place  Normal Continent Weight: 139 lb 8 oz (63.3 kg) Height:  5\' 3"  (160 cm)  BEHAVIORAL SYMPTOMS/MOOD NEUROLOGICAL BOWEL NUTRITION STATUS      Continent Diet (regular)  AMBULATORY STATUS COMMUNICATION OF NEEDS Skin   Limited Assist Verbally Normal                       Personal Care Assistance Level of Assistance  Bathing, Feeding, Dressing Bathing Assistance: Limited assistance Feeding assistance: Independent Dressing Assistance: Limited assistance     Functional Limitations Info  Sight, Hearing, Speech Sight Info: Adequate Hearing Info: Adequate Speech Info: Adequate    SPECIAL CARE FACTORS FREQUENCY  PT (By licensed PT), OT (By licensed OT)     PT Frequency: 5x OT Frequency: 5x            Contractures Contractures Info: Not present    Additional Factors Info  Code Status, Allergies Code Status Info: Full Code Allergies Info: NKA           Current Medications (02/08/2016):  This is the current hospital active medication list Current Facility-Administered Medications  Medication Dose Route Frequency Provider Last Rate Last Dose  . acetaminophen (TYLENOL) tablet 650 mg  650 mg Oral Q6H PRN Leda GauzeKaren J Kirby-Graham, NP   650 mg at 02/02/16 1710  . aspirin EC tablet 81 mg  81 mg Oral Daily Hillary BowJared M Gardner, DO   81 mg at 02/08/16 [id number]  . clindamycin (CLEOCIN) IVPB 600 mg  600 mg Intravenous Q8H Rolly SalterPranav M Patel, MD   600  mg at 02/08/16 0536  . enoxaparin (LOVENOX) injection 30 mg  30 mg Subcutaneous Q24H Ripudeep K Rai, MD   30 mg at 02/07/16 2119  . ferrous sulfate tablet 325 mg  325 mg Oral Daily Hillary BowJared M Gardner, DO   325 mg at 02/08/16 [id number]  . furosemide (LASIX) tablet 20 mg  20 mg Oral Daily Rolly SalterPranav M Patel, MD   20 mg at 02/08/16 0933  . ipratropium-albuterol (DUONEB) 0.5-2.5 (3) MG/3ML nebulizer solution 3 mL  3 mL Nebulization Q4H PRN Ripudeep Jenna LuoK Rai, MD   3 mL at 02/03/16 1201   . levothyroxine (SYNTHROID, LEVOTHROID) tablet 25 mcg  25 mcg Oral QAC breakfast Hillary BowJared M Gardner, DO   25 mcg at 02/08/16 [id number]  . oxyCODONE (Oxy IR/ROXICODONE) immediate release tablet 5 mg  5 mg Oral Q6H PRN Leda GauzeKaren J Kirby-Graham, NP   5 mg at 02/02/16 1710  . saccharomyces boulardii (FLORASTOR) capsule 250 mg  250 mg Oral BID Rolly SalterPranav M Patel, MD   250 mg at 02/08/16 0933  . traMADol (ULTRAM) tablet 50 mg  50 mg Oral Q6H PRN Leda GauzeKaren J Kirby-Graham, NP   50 mg at 02/07/16 2119     Discharge Medications: Please see discharge summary for a list of discharge medications.  Relevant Imaging Results:  Relevant Lab Results:   Additional Information SSN: [id number]  Raye SorrowCoble, Brax Wal[place] N, [place]

## 2016-02-08 NOTE — Progress Notes (Signed)
OT Cancellation Note  Patient Details Name: Jessica Hawkins MRN: [id number] DOB: 1922-12-05   Cancelled Treatment:    Reason Eval/Treat Not Completed: Other (comment) (OT needs can be evaluated and met at next venue of care). OT came by to perform evaluation, Pt planning on going to [place] per RN.  OT evaluation and needs will be met at next venue. Thank you for this referral.  Merri Ray [facility] 02/08/2016, 3:40 PM

## 2016-02-08 NOTE — Progress Notes (Signed)
Physical Therapy Treatment Patient Details Name: Jessica Hawkins G Redfield MRN: [id number] DOB: [birth year] Today's Date: 02/08/2016    History of Present Illness Patient is a 82 y/o female with hx of incontinence, left TKA, arthritis, vertigo, HTN, HLD, DM, BBB presents with LLE cellulitis failed as outpatient.    PT Comments    Pt admitted with above diagnosis. Pt currently with functional limitations due to the deficits listed below (see PT Problem List). Pt was able to ambulate with min guard assist to supervision.  Has 24 hour care at home.  Progressing well.   Pt will benefit from skilled PT to increase their independence and safety with mobility to allow discharge to the venue listed below.    Follow Up Recommendations  Home health PT;Supervision/Assistance - 24 hour (HHOT, HHAide)     Equipment Recommendations  None recommended by PT    Recommendations for Other Services OT consult     Precautions / Restrictions Precautions Precautions: Fall Restrictions Weight Bearing Restrictions: No    Mobility  Bed Mobility Overal bed mobility: Needs Assistance Bed Mobility: Supine to Sit     Supine to sit: HOB elevated;Supervision     General bed mobility comments: Increased time but no assist needed. Use of rail.  Transfers Overall transfer level: Needs assistance Equipment used: Rolling walker (2 wheeled) Transfers: Sit to/from Stand Sit to Stand: Mod assist;Min assist         General transfer comment: significant posterior lean upon standing; Does best if you have her spread her feet prior to stand.  assist to power to standing.  Cues for hand placement as well each time she stood.    Ambulation/Gait Ambulation/Gait assistance: Min guard;Supervision Ambulation Distance (Feet): 220 Feet (110 x 2) Assistive device: Rolling walker (2 wheeled) Gait Pattern/deviations: Step-through pattern;Decreased stride length;Trunk flexed;Decreased stance time - left Gait velocity:  decreased   General Gait Details: Slow, mostly steady gait using RW. Cues for upright and forward gaze constantly. Took pt close to 25 min to ambulate 220 feet with a 3 min sitting rest break midway.    Stairs            Wheelchair Mobility    Modified Rankin (Stroke Patients Only)       Balance Overall balance assessment: Needs assistance Sitting-balance support: Feet supported;No upper extremity supported Sitting balance-Leahy Scale: Good     Standing balance support: Bilateral upper extremity supported;During functional activity Standing balance-Leahy Scale: Poor Standing balance comment: Reliant on BUEs for support                    Cognition Arousal/Alertness: Awake/alert Behavior During Therapy: WFL for tasks assessed/performed Overall Cognitive Status: History of cognitive impairments - at baseline                      Exercises General Exercises - Lower Extremity Ankle Circles/Pumps: AROM;Both;10 reps;Seated Quad Sets: AROM;Both;10 reps;Seated Long Arc Quad: AROM;Both;10 reps;Seated Hip Flexion/Marching: AROM;Both;10 reps;Seated    General Comments        Pertinent Vitals/Pain Pain Assessment: No/denies pain   VSS Home Living                      Prior Function            PT Goals (current goals can now be found in the care plan section) Acute Rehab PT Goals Patient Stated Goal: to get stronger so I can get around at home Progress towards  PT goals: Progressing toward goals    Frequency    Min 3X/week      PT Plan Current plan remains appropriate    Co-evaluation             End of Session Equipment Utilized During Treatment: Gait belt Activity Tolerance: Patient limited by fatigue Patient left: with call bell/phone within reach;in chair;with chair alarm set     Time: [id number] PT Time Calculation (min) (ACUTE ONLY): 38 min  Charges:  $Gait Training: 23-37 mins $Therapeutic Exercise: 8-22  mins                    G Codes:      Amadeo GarnetDawn F Cuthbert Turton 02/08/2016, 12:01 PM  [employer] CorporationDawn Romen Yutzy,PT Acute Rehabilitation [phone])

## 2016-02-08 NOTE — Discharge Summary (Signed)
[facility] Discharge Summary   Patient: Jessica Hawkins:[id number]   PCP: Jeanmarie Hubert, MD DOB: 08/06/1922   Date of admission: 01/31/2016   Date of discharge:  02/08/2016    Discharge Diagnoses:  Principal Problem:   Cellulitis of left anterior lower leg Active Problems:   GERD (gastroesophageal reflux disease)   HTN (hypertension)   Hypothyroid   Hyperlipidemia   Controlled type 2 DM with peripheral circulatory disorder (HCC)   S/P total knee arthroplasty   Primary osteoarthritis of both knees   Admitted From: home Disposition:  SNF  Recommendations for Outpatient Follow-up:  1. Please follow up with PCP in 1 week 2. Please get CBC and BMP checked in 1 week. Also monitor ESR and CRP.   Follow-up Information    Jeanmarie Hubert, MD. Schedule an appointment as soon as possible for a visit in 1 week(s).   Specialty:  Internal Medicine Contact information: [place] [address] [phone]        lab Follow up in 1 week(s).   Why:  get CBC and BMP in 1 week         Diet recommendation: cardiac diet  Activity: The patient is advised to gradually reintroduce usual activities.  Discharge Condition: good  Code Status: full code  History of present illness: As per the H and P dictated on admission, "Jessica Hawkins is a 82 y.o. female with medical history significant of recent cellulitis of L ankle, treated with course of doxycycline at end of October.  Had resolved but then reoccurred recently, just started again on doxycycline yesterday without improvement.  No fever, chills, or systemic symptoms.  Cellulitis is persistent.  Nothing makes better or worse."  Hospital Course:   Summary of her active problems in the hospital is as following. Principal Problem:   Cellulitis of left anterior lower leg   Venous insufficiency (chronic) (peripheral) - Doppler ultrasound negative for DVT. Likely associated with chronic edema and worsening of  venous insufficiency after recent surgery.  - Reportedly patient failed outpatient oral antibiotics. As per prior progress note, patient did not respond well with IV Ancef and therefore antibiotics changed to IV vancomycin and Zosyn on 12/2 - Patient has nonpurulent erythematous cellulitis in lower extremity - blood culture negative, clinically better, antibiotics changed to clindamycin and pt continued to improved. Will finish 7 days of treatment since 02/03/2016. - PT evaluated patient, she will likely require SNF, patient is agreeable, consulted social worker Normal ABI  ESR 128, CRP 11, CPK 24. Recommend to recheck in 1 week to monitor improvement. Continue probiotics.   Active Problems:   CKD (chronic kidney disease) stage 3, GFR 30-59 ml/min   Anemia of chronic kidney disease s creatinine chronically elevated. Need to monitor Stable in hospital Hb remaind stable as well and no evidence of active bleed. Add iron and folic acid Recheck in 1 week May need further outpatient work up if continues to drop.    HTN (hypertension) Continue home lasix    Hypothyroid Continue synthroid  Diet Controlled type 2 DM with peripheral circulatory disorder (HCC) Monitor    S/P total knee arthroplasty   Primary osteoarthritis of both knees Pt planning to have right knee operated in January  Recommend to monitor improvement in exercise status as well as cellulitis prior to procedure    All other chronic medical condition were stable during the hospitalization.  Patient was seen by physical therapy, who recommended home health/SNF, which was arranged  by Education officer, museum and Tourist information centre manager. On the day of the discharge the patient's vitals were stable, and no other acute medical condition were reported by patient. the patient was felt safe to be discharge at SNF with therapy.  Procedures and Results:   Left Lower Extremity Venous Duplex Evaluation Summary:  - No evidence of deep vein  thrombosis involving the visualized   veins of the left lower extremity. - . Incidental findings are consistent with: enlarged lymph node on   the left. - No evidence of Baker&'s cyst on the left.  VAS [place] ABI WITH/WO TBI Bilateral ABIs are within normal limits at rest.  Consultations:  none  DISCHARGE MEDICATION: Current Discharge Medication List    START taking these medications   Details  clindamycin (CLEOCIN) 300 MG capsule Take 2 capsules (600 mg total) by mouth 3 (three) times daily. Qty: 12 capsule, Refills: 0    folic acid (FOLVITE) 1 MG tablet Take 1 tablet (1 mg total) by mouth daily. Qty: 30 tablet, Refills: 0    saccharomyces boulardii (FLORASTOR) 250 MG capsule Take 1 capsule (250 mg total) by mouth 2 (two) times daily. Qty: 30 capsule, Refills: 0      CONTINUE these medications which have CHANGED   Details  ferrous sulfate 325 (65 FE) MG tablet Take 1 tablet (325 mg total) by mouth 3 (three) times daily with meals. Qty: 90 tablet, Refills: 0      CONTINUE these medications which have NOT CHANGED   Details  aspirin EC 81 MG tablet Take 81 mg by mouth daily.    furosemide (LASIX) 40 MG tablet Take one tablet by mouth once daily for edema Qty: 90 tablet, Refills: 1   Associated Diagnoses: Edema, unspecified type    levothyroxine (SYNTHROID, LEVOTHROID) 25 MCG tablet Take one tablet by mouth once daily for thyroid supplement Qty: 90 tablet, Refills: 1    Multiple Vitamin (MULTIVITAMIN WITH MINERALS) TABS tablet Take 1 tablet by mouth daily. Centrum       No Known Allergies Discharge Instructions    Diet - low sodium heart healthy    Complete by:  As directed    Discharge instructions    Complete by:  As directed    It is important that you read following instructions as well as go over your medication list with RN to help you understand your care after this hospitalization.  Discharge Instructions: Please follow-up with PCP in one week  Please  request your primary care physician to go over all Hospital Tests and Procedure/Radiological results at the follow up,  Please get all Hospital records sent to your PCP by signing hospital release before you go home.   Do not take more than prescribed Pain, Sleep and Anxiety Medications. You were cared for by a hospitalist during your hospital stay. If you have any questions about your discharge medications or the care you received while you were in the hospital after you are discharged, you can call the unit and ask to speak with the hospitalist on call if the hospitalist that took care of you is not available.  Once you are discharged, your primary care physician will handle any further medical issues. Please note that NO REFILLS for any discharge medications will be authorized once you are discharged, as it is imperative that you return to your primary care physician (or establish a relationship with a primary care physician if you do not have one) for your aftercare needs so that they can  reassess your need for medications and monitor your lab values. You Must read complete instructions/literature along with all the possible adverse reactions/side effects for all the Medicines you take and that have been prescribed to you. Take any new Medicines after you have completely understood and accept all the possible adverse reactions/side effects. Wear Seat belts while driving.   Increase activity slowly    Complete by:  As directed      Discharge Exam: Filed Weights   01/31/16 2045  Weight: 63.3 kg (139 lb 8 oz)   Vitals:   02/07/16 2006 02/08/16 0501  BP: (!) 136/56 (!) 132/52  Pulse: 73 68  Resp: 16 16  Temp: 97.7 F (36.5 C) 97.9 F (36.6 C)   General: Appear in no distress, no Rash; Oral Mucosa moist. Cardiovascular: S1 and S2 Present, no Murmur, no JVD Respiratory: Bilateral Air entry present and Clear to Auscultation, no Crackles, no wheezes Abdomen: Bowel Sound present, Soft and no  tenderness Extremities: left leg Pedal edema, no calf tenderness Neurology: Grossly no focal neuro deficit.  The results of significant diagnostics from this hospitalization (including imaging, microbiology, ancillary and laboratory) are listed below for reference.    Significant Diagnostic Studies: Dg Tibia/fibula Left  Result Date: 01/31/2016 CLINICAL DATA:  Cellulitis, assess for osteomyelitis EXAM: LEFT TIBIA AND FIBULA - 2 VIEW COMPARISON:  06/29/2011 C-arm views of the left hip and femur FINDINGS: There is diffuse soft tissue swelling consistent with history of cellulitis. Partially visualized distal intra medullary femoral rod and left knee arthroplasty appear intact. No cortical bone destruction nor acute fracture is identified. Vascular phleboliths are seen distally. The bones are slightly osteopenic in appearance ankle mortise is maintained. Calcaneal enthesophytes are noted along the plantar dorsal aspect. IMPRESSION: Soft tissue swelling without acute fracture nor bone destruction. No hardware failure identified. Calcaneal enthesophytes. Electronically Signed   By: Ashley Royalty M.D.   On: 01/31/2016 18:57   Dg Knee Complete 4 Views Left  Result Date: 02/07/2016 CLINICAL DATA:  Initial evaluation for 1 week history of left knee pain with swelling. EXAM: LEFT KNEE - COMPLETE 4+ VIEW COMPARISON:  Prior radiograph from 01/31/2016. FINDINGS: Distal aspect of the an intra medullary fixation rod with interlocking screw present within the distal femur. Left total knee arthroplasty in place. No hardware complication. No acute fracture or dislocation. No appreciable joint effusion. Mild diffuse soft tissue swelling about the knee. IMPRESSION: 1. No acute osseous abnormality about the left knee. 2. Sequela prior left total knee arthroplasty as well as IM nail fixation of left femur. No hardware complication. 3. Mild diffuse soft tissue swelling about the knee. Electronically Signed   By: Jeannine Boga M.D.   On: 02/07/2016 16:31    Microbiology: Recent Results (from the past 240 hour(s))  Blood culture (routine x 2)     Status: None   Collection Time: 01/31/16  5:28 PM  Result Value Ref Range Status   Specimen Description BLOOD RIGHT ANTECUBITAL  Final   Special Requests BOTTLES DRAWN AEROBIC AND ANAEROBIC 5 CC  Final   Culture NO GROWTH 5 DAYS  Final   Report Status 02/05/2016 FINAL  Final  Blood culture (routine x 2)     Status: None   Collection Time: 01/31/16  5:50 PM  Result Value Ref Range Status   Specimen Description BLOOD LEFT ANTECUBITAL  Final   Special Requests BOTTLES DRAWN AEROBIC AND ANAEROBIC 10 CC  Final   Culture NO GROWTH 5 DAYS  Final  Report Status 02/05/2016 FINAL  Final  MRSA PCR Screening     Status: None   Collection Time: 02/07/16 12:20 PM  Result Value Ref Range Status   MRSA by PCR NEGATIVE NEGATIVE Final    Comment:        The GeneXpert MRSA Assay (FDA approved for NASAL specimens only), is one component of a comprehensive MRSA colonization surveillance program. It is not intended to diagnose MRSA infection nor to guide or monitor treatment for MRSA infections.      Labs: CBC:  Recent Labs Lab 02/07/16 0612 02/08/16 0448  WBC 6.7 6.6  NEUTROABS  --  4.1  HGB 7.9* 7.6*  HCT 24.6* 23.8*  MCV 81.7 81.8  PLT 289 492   Basic Metabolic Panel:  Recent Labs Lab 02/02/16 0430 02/03/16 0609 02/04/16 0312 02/07/16 0612  NA 135 136 132* 138  K 3.6 3.9 4.0 3.9  CL 101 103 100* 106  CO2 '25 24 23 22  ' GLUCOSE 120* 113* 123* 110*  BUN 32* 28* 24* 22*  CREATININE 1.58* 1.48* 1.38* 1.46*  CALCIUM 8.2* 8.1* 8.1* 8.3*   Cardiac Enzymes:  Recent Labs Lab 02/07/16 0947  CKTOTAL 24*  Time spent: 30 minutes  Signed:  PATEL, PRANAV  [facility]  02/08/2016 , 12:45 PM

## 2016-02-08 NOTE — Progress Notes (Signed)
Patient is discharged from room 6N24 at this time. Alert and in stable condition. IV site d/c'd and instructions read to patient and daughter with all questions answered and also MD notified of daughter's concern and he spoke with daughter prior discharge. Left unit via wheelchair with daughter, caregiver and all belongings at side

## 2016-02-11 DIAGNOSIS — E1129 Type 2 diabetes mellitus with other diabetic kidney complication: Secondary | ICD-10-CM | POA: Diagnosis not present

## 2016-02-11 DIAGNOSIS — N186 End stage renal disease: Secondary | ICD-10-CM | POA: Diagnosis not present

## 2016-02-11 DIAGNOSIS — L03116 Cellulitis of left lower limb: Secondary | ICD-10-CM | POA: Diagnosis not present

## 2016-02-11 DIAGNOSIS — R600 Localized edema: Secondary | ICD-10-CM | POA: Diagnosis not present

## 2016-02-11 DIAGNOSIS — D6489 Other specified anemias: Secondary | ICD-10-CM | POA: Diagnosis not present

## 2016-02-14 ENCOUNTER — Telehealth: Payer: Self-pay

## 2016-02-14 NOTE — Telephone Encounter (Signed)
Left message, do you want to pick form up or have us mail it to us. Call back.

## 2016-02-15 ENCOUNTER — Ambulatory Visit: Payer: Medicare Other | Admitting: Nurse Practitioner

## 2016-02-18 DIAGNOSIS — R600 Localized edema: Secondary | ICD-10-CM | POA: Diagnosis not present

## 2016-02-18 DIAGNOSIS — D6489 Other specified anemias: Secondary | ICD-10-CM | POA: Diagnosis not present

## 2016-02-18 DIAGNOSIS — L03116 Cellulitis of left lower limb: Secondary | ICD-10-CM | POA: Diagnosis not present

## 2016-02-22 ENCOUNTER — Telehealth: Payer: Self-pay | Admitting: *Deleted

## 2016-02-22 DIAGNOSIS — E039 Hypothyroidism, unspecified: Secondary | ICD-10-CM | POA: Diagnosis not present

## 2016-02-22 DIAGNOSIS — N183 Chronic kidney disease, stage 3 (moderate): Secondary | ICD-10-CM | POA: Diagnosis not present

## 2016-02-22 DIAGNOSIS — F0280 Dementia in other diseases classified elsewhere without behavioral disturbance: Secondary | ICD-10-CM | POA: Diagnosis not present

## 2016-02-22 DIAGNOSIS — I129 Hypertensive chronic kidney disease with stage 1 through stage 4 chronic kidney disease, or unspecified chronic kidney disease: Secondary | ICD-10-CM | POA: Diagnosis not present

## 2016-02-22 DIAGNOSIS — D649 Anemia, unspecified: Secondary | ICD-10-CM | POA: Diagnosis not present

## 2016-02-22 DIAGNOSIS — M1711 Unilateral primary osteoarthritis, right knee: Secondary | ICD-10-CM | POA: Diagnosis not present

## 2016-02-22 DIAGNOSIS — E1151 Type 2 diabetes mellitus with diabetic peripheral angiopathy without gangrene: Secondary | ICD-10-CM | POA: Diagnosis not present

## 2016-02-22 DIAGNOSIS — G309 Alzheimer's disease, unspecified: Secondary | ICD-10-CM | POA: Diagnosis not present

## 2016-02-22 DIAGNOSIS — E1122 Type 2 diabetes mellitus with diabetic chronic kidney disease: Secondary | ICD-10-CM | POA: Diagnosis not present

## 2016-02-22 NOTE — Telephone Encounter (Signed)
Jud[facility]phan[facility] called requesting the dates of patient's Pneumonia Vaccine and Flu Vaccine. Dates given.

## 2016-02-23 ENCOUNTER — Telehealth: Payer: Self-pay | Admitting: *Deleted

## 2016-02-23 DIAGNOSIS — N183 Chronic kidney disease, stage 3 (moderate): Secondary | ICD-10-CM | POA: Diagnosis not present

## 2016-02-23 DIAGNOSIS — G309 Alzheimer's disease, unspecified: Secondary | ICD-10-CM | POA: Diagnosis not present

## 2016-02-23 DIAGNOSIS — E1151 Type 2 diabetes mellitus with diabetic peripheral angiopathy without gangrene: Secondary | ICD-10-CM | POA: Diagnosis not present

## 2016-02-23 DIAGNOSIS — E1122 Type 2 diabetes mellitus with diabetic chronic kidney disease: Secondary | ICD-10-CM | POA: Diagnosis not present

## 2016-02-23 DIAGNOSIS — I129 Hypertensive chronic kidney disease with stage 1 through stage 4 chronic kidney disease, or unspecified chronic kidney disease: Secondary | ICD-10-CM | POA: Diagnosis not present

## 2016-02-23 DIAGNOSIS — M1711 Unilateral primary osteoarthritis, right knee: Secondary | ICD-10-CM | POA: Diagnosis not present

## 2016-02-23 NOTE — Telephone Encounter (Signed)
Robin with [facility] called and requested verbal order for Speech therapy to help with memory issues. Verbal order given.

## 2016-02-28 DIAGNOSIS — G309 Alzheimer's disease, unspecified: Secondary | ICD-10-CM | POA: Diagnosis not present

## 2016-02-28 DIAGNOSIS — M1711 Unilateral primary osteoarthritis, right knee: Secondary | ICD-10-CM | POA: Diagnosis not present

## 2016-02-28 DIAGNOSIS — N183 Chronic kidney disease, stage 3 (moderate): Secondary | ICD-10-CM | POA: Diagnosis not present

## 2016-02-28 DIAGNOSIS — E1151 Type 2 diabetes mellitus with diabetic peripheral angiopathy without gangrene: Secondary | ICD-10-CM | POA: Diagnosis not present

## 2016-02-28 DIAGNOSIS — I129 Hypertensive chronic kidney disease with stage 1 through stage 4 chronic kidney disease, or unspecified chronic kidney disease: Secondary | ICD-10-CM | POA: Diagnosis not present

## 2016-02-28 DIAGNOSIS — E1122 Type 2 diabetes mellitus with diabetic chronic kidney disease: Secondary | ICD-10-CM | POA: Diagnosis not present

## 2016-02-29 ENCOUNTER — Inpatient Hospital Stay (HOSPITAL_COMMUNITY): Admission: RE | Admit: 2016-02-29 | Payer: Medicare Other | Source: Ambulatory Visit

## 2016-02-29 ENCOUNTER — Ambulatory Visit (INDEPENDENT_AMBULATORY_CARE_PROVIDER_SITE_OTHER): Payer: Medicare Other | Admitting: Nurse Practitioner

## 2016-02-29 ENCOUNTER — Encounter: Payer: Self-pay | Admitting: Nurse Practitioner

## 2016-02-29 VITALS — BP 148/60 | HR 63 | Temp 97.4°F | Resp 17 | Ht 63.0 in | Wt 142.4 lb

## 2016-02-29 DIAGNOSIS — N183 Chronic kidney disease, stage 3 unspecified: Secondary | ICD-10-CM

## 2016-02-29 DIAGNOSIS — M170 Bilateral primary osteoarthritis of knee: Secondary | ICD-10-CM

## 2016-02-29 DIAGNOSIS — L03116 Cellulitis of left lower limb: Secondary | ICD-10-CM | POA: Diagnosis not present

## 2016-02-29 DIAGNOSIS — D649 Anemia, unspecified: Secondary | ICD-10-CM

## 2016-02-29 LAB — CBC WITH DIFFERENTIAL/PLATELET
BASOS ABS: 0 {cells}/uL (ref 0–200)
Basophils Relative: 0 %
EOS ABS: 53 {cells}/uL (ref 15–500)
Eosinophils Relative: 1 %
HEMATOCRIT: 28.5 % — AB (ref 35.0–45.0)
Hemoglobin: 8.9 g/dL — ABNORMAL LOW (ref 11.7–15.5)
LYMPHS PCT: 25 %
Lymphs Abs: 1325 cells/uL (ref 850–3900)
MCH: 25.9 pg — AB (ref 27.0–33.0)
MCHC: 31.2 g/dL — ABNORMAL LOW (ref 32.0–36.0)
MCV: 82.8 fL (ref 80.0–100.0)
MONOS PCT: 12 %
MPV: 9.9 fL (ref 7.5–12.5)
Monocytes Absolute: 636 cells/uL (ref 200–950)
NEUTROS PCT: 62 %
Neutro Abs: 3286 cells/uL (ref 1500–7800)
PLATELETS: 241 10*3/uL (ref 140–400)
RBC: 3.44 MIL/uL — ABNORMAL LOW (ref 3.80–5.10)
RDW: 15.6 % — AB (ref 11.0–15.0)
WBC: 5.3 10*3/uL (ref 3.8–10.8)

## 2016-02-29 NOTE — Progress Notes (Signed)
Careteam: Patient Care Team: Estill Dooms, MD as PCP - General (Internal Medicine) Clarene Essex, MD as Consulting Physician (Gastroenterology) Franchot Gallo, MD as Consulting Physician (Urology) Roseanne Kaufman, MD as Consulting Physician (Orthopedic Surgery) Lavonna Monarch, MD as Consulting Physician (Dermatology) Marchia Bond, MD as Consulting Physician (Orthopedic Surgery)  Advanced Directive information Does Patient Have a Medical Advance Directive?: Yes, Type of Advance Directive: [place];Living will  No Known Allergies  Chief Complaint  Patient presents with  . Transitions Of Care    Discharged from [facility] on 02/20/16.       HPI: Patient is a 82 y.o. female seen in the office today for a follow-up from recent stay at [facility]. She was discharged on 12/19.  Pt was hospitalized on 11/29-12/7 due to cellulitis of left anterior lowe leg. Was treated with IV vanc and IV zosyn after she became worse on oral antibiotics. It was recommended she got to SNF after discharge for therapy. Pt was discharged from SNF on 12/19. Completed clindamycin. No Diarrhea.   Left leg swollen. Patient reports she does still have pain in the leg when she is up on it a lot. She only takes Aleve when the pain becomes unbearable as she is afraid to take anything or pain because she does not want to get addicted and because she feels like every time she goes to the doctor they change between Tylenol and Aleve for pain.   Patient to see gastroenterologist in January 2018 due to anemia per the patients caregiver.  Review of Systems:  Review of Systems  Constitutional: Negative for activity change, appetite change, chills, fatigue, fever and unexpected weight change.  Respiratory: Negative for cough, shortness of breath and wheezing.   Cardiovascular: Positive for leg swelling (left leg). Negative for chest pain and palpitations.  Gastrointestinal: Negative for  abdominal distention, constipation, diarrhea, nausea and vomiting.       Incontinent of stool and urine sometimes  Skin: Negative.   Neurological: Positive for weakness (Left leg) and numbness (Left foot/leg).       Left foot drop.  Hematological: Bruises/bleeds easily.    Past Medical History:  Diagnosis Date  . Acute blood loss anemia   . Anginal pain [facility])    'i haven't had them in a long time but i used to'  . Arthritis    Osteoarthritis  . BBB (bundle branch block)    RT  . Coronary atherosclerosis of unspecified type of vessel, native or graft   . Debility, unspecified   . Diabetes mellitus without complication ([place])    Diet controlled  . Diaphragmatic hernia without mention of obstruction or gangrene   . Disturbance of skin sensation   . Edema   . Fibrocystic breast   . Gait disorder   . GERD (gastroesophageal reflux disease) 06/29/2011  . Hiatal hernia   . History of transfusion of packed red blood cells   . Hyperlipidemia   . Hypertension 06/29/11  . Hypothyroid 06/29/2011  . Insomnia   . OAB (overactive bladder)   . Osteoporosis   . Platelet disorder ([place])    ? Von Willebrand; abnormal PLT function assay 07/04/11   . Unspecified constipation   . Unspecified sleep apnea   . Unspecified urinary incontinence   . Unspecified vitamin D deficiency   . Unsteady gait   . Vertigo    Past Surgical History:  Procedure Laterality Date  . EYE SURGERY Right 2005   Cataract surgery  .  EYE SURGERY Left 2006   Cataract surgery  . FEMUR IM NAIL  06/29/2011   Procedure: INTRAMEDULLARY (IM) NAIL FEMORAL;  Surgeon: Augustin Schooling, MD;  Location: [place];  Service: Orthopedics;  Laterality: Left;  . HIP SURGERY Right 2002   ORIF  . ROTATOR CUFF REPAIR Left 1999   Tear  . THYROIDECTOMY  1952  . TONSILLECTOMY    . TOTAL KNEE ARTHROPLASTY Left 05/29/2015   Procedure: TOTAL KNEE ARTHROPLASTY;  Surgeon: Vickey Huger, MD;  Location: [place];  Service: Orthopedics;  Laterality: Left;    Social History:   reports that she has never smoked. She has never used smokeless tobacco. She reports that she does not drink alcohol or use drugs.  Family History  Problem Relation Age of Onset  . Cancer Mother     Stomach  . Cancer Father     Prostate  . Emphysema Father   . Alcohol abuse Sister   . Liver disease Sister   . Kidney disease Brother   . Hypertension Daughter   . Cancer Brother     Prostate, Bladder  . Emphysema Brother   . Cancer Brother     Prostate  . Arthritis Daughter     Knees  . Heart murmur Son   . Mental illness Son     Autism    Medications: Patient's Medications  New Prescriptions   No medications on file  Previous Medications   ASPIRIN EC 81 MG TABLET    Take 81 mg by mouth daily.   EMOLLIENT (UDDERLY SMOOTH) CREA    Apply 1 application topically 2 (two) times daily as needed (for dry skin related to healing cellulitis).   FERROUS SULFATE 325 (65 FE) MG TABLET    Take 1 tablet (325 mg total) by mouth 3 (three) times daily with meals.   FOLIC ACID (FOLVITE) 1 MG TABLET    Take 1 tablet (1 mg total) by mouth daily.   FUROSEMIDE (LASIX) 40 MG TABLET    Take one tablet by mouth once daily for edema   LEVOTHYROXINE (SYNTHROID, LEVOTHROID) 25 MCG TABLET    Take one tablet by mouth once daily for thyroid supplement   MULTIPLE VITAMIN (MULTIVITAMIN WITH MINERALS) TABS TABLET    Take 1 tablet by mouth daily. Centrum   SACCHAROMYCES BOULARDII (FLORASTOR) 250 MG CAPSULE    Take 1 capsule (250 mg total) by mouth 2 (two) times daily.  Modified Medications   No medications on file  Discontinued Medications   No medications on file     Physical Exam:  Vitals:   02/29/16 1430  BP: (!) 148/60  Pulse: 63  Resp: 17  Temp: 97.4 F (36.3 C)  TempSrc: Oral  SpO2: 96%  Weight: 142 lb 6.4 oz (64.6 kg)  Height: _0  (1.6 m)   Body mass index is 25.23 kg/m.  Physical Exam  Constitutional: She is oriented to person, place, and time. She appears  well-developed and well-nourished. No distress.  HENT:  Head: Normocephalic and atraumatic.  Eyes: Conjunctivae and EOM are normal. Pupils are equal, round, and reactive to light.  Neck: Normal range of motion. Neck supple.  Cardiovascular: Normal rate, regular rhythm and normal heart sounds.   Varicose veins.  Pulmonary/Chest: Effort normal and breath sounds normal. No respiratory distress. She has no wheezes. She has no rales.  Abdominal: Soft. Bowel sounds are normal.  Musculoskeletal: She exhibits edema (2+ left leg). She exhibits no tenderness.  Left foot drop.  Neurological: She is alert and oriented to person, place, and time. Coordination normal.  Skin: Skin is warm and dry. She is not diaphoretic.    Labs reviewed: Basic Metabolic Panel:  Recent Labs  07/21/15 0913  02/03/16 0609 02/04/16 0312 02/07/16 0612  NA 140  < > 136 132* 138  K 4.7  < > 3.9 4.0 3.9  CL 101  < > 103 100* 106  CO2 24  < > _0 GLUCOSE 109*  < > 113* 123* 110*  BUN 30  < > 28* 24* 22*  CREATININE 0.91  < > 1.48* 1.38* 1.46*  CALCIUM 9.2  < > 8.1* 8.1* 8.3*  TSH 1.030  --   --   --   --   < > = values in this interval not displayed. Liver Function Tests:  Recent Labs  10/23/15 0408 12/20/15 1018 01/31/16 1400  AST _1 ALT _2 ALKPHOS 75 85 64  BILITOT 0.4 0.7 0.9  PROT 6.7 7.4 6.8  ALBUMIN 3.7 3.8 2.9*    Recent Labs  10/23/15 0408  LIPASE 25   No results for input(s): AMMONIA in the last 8760 hours. CBC:  Recent Labs  06/05/15  01/31/16 1400 02/07/16 0612 02/08/16 0448  WBC 5.3  < > 9.4 6.7 6.6  NEUTROABS 3  --  8.0*  --  4.1  HGB 7.8*  < > 10.0* 7.9* 7.6*  HCT 25*  < > 31.2* 24.6* 23.8*  MCV  --   < > 82.8 81.7 81.8  PLT 238  < > 151 289 324  < > = values in this interval not displayed. Lipid Panel: No results for input(s): CHOL, HDL, LDLCALC, TRIG, CHOLHDL, LDLDIRECT in the last 8760 hours. TSH:  Recent Labs  07/21/15 0913  TSH 1.030    A1C: Lab Results  Component Value Date   HGBA1C 5.9 (H) 12/20/2015     Assessment/Plan 1. Cellulitis of left lower extremity - Resolved. Antibiotics completed.  - Patient has swelling of left leg - encouraged to continue elevating her lower extremities when sitting or lying down.   2. Anemia, unspecified type - To follow-up with gastroenterologist in January 2018.  - Hgb on discharge from the hospital was 7.6 on 12/7.  - CBC with Differential/Platelets  3. CKD (chronic kidney disease) stage 3, GFR 30-59 ml/min - Instructed to avoid all NSAIDS. - BMP with eGFR  4. Primary osteoarthritis of both knees - May take Tylenol 325 mg 2 tablets Q6H PRN for pain, not to exceed 9 tablets in 24 hours.    Keep follow-up with Dr. Nyoka Cowden as scheduled in January 2018.   Carlos American. Harle Battiest  [facility] [phone] 8 am - 5 pm) [phone] (after hours)

## 2016-02-29 NOTE — Patient Instructions (Addendum)
Take Tylenol 650 mg every 6 hours as needed for pain. Do not exceed 9 tablets in 24 hours.   Do NOT take NSAIDS (Aleve, Advil, Motrin, Ibuprofen)   Tylenol nor Aleve are addicting medications.  Keep follow up with Dr Chilton SiGreen as scheduled

## 2016-03-01 DIAGNOSIS — G309 Alzheimer's disease, unspecified: Secondary | ICD-10-CM | POA: Diagnosis not present

## 2016-03-01 DIAGNOSIS — E1122 Type 2 diabetes mellitus with diabetic chronic kidney disease: Secondary | ICD-10-CM | POA: Diagnosis not present

## 2016-03-01 DIAGNOSIS — N183 Chronic kidney disease, stage 3 (moderate): Secondary | ICD-10-CM | POA: Diagnosis not present

## 2016-03-01 DIAGNOSIS — I129 Hypertensive chronic kidney disease with stage 1 through stage 4 chronic kidney disease, or unspecified chronic kidney disease: Secondary | ICD-10-CM | POA: Diagnosis not present

## 2016-03-01 DIAGNOSIS — M1711 Unilateral primary osteoarthritis, right knee: Secondary | ICD-10-CM | POA: Diagnosis not present

## 2016-03-01 DIAGNOSIS — E1151 Type 2 diabetes mellitus with diabetic peripheral angiopathy without gangrene: Secondary | ICD-10-CM | POA: Diagnosis not present

## 2016-03-01 LAB — BASIC METABOLIC PANEL WITH GFR
BUN: 32 mg/dL — AB (ref 7–25)
CO2: 27 mmol/L (ref 20–31)
CREATININE: 1.2 mg/dL — AB (ref 0.60–0.88)
Calcium: 9.1 mg/dL (ref 8.6–10.4)
Chloride: 100 mmol/L (ref 98–110)
GFR, EST AFRICAN AMERICAN: 46 mL/min — AB (ref >=60)
GFR, Est Non African American: 40 mL/min — ABNORMAL LOW (ref >=60)
GLUCOSE: 119 mg/dL — AB (ref 65–99)
Potassium: 4.3 mmol/L (ref 3.5–5.3)
Sodium: 138 mmol/L (ref 135–146)

## 2016-03-06 ENCOUNTER — Telehealth: Payer: Self-pay | Admitting: *Deleted

## 2016-03-06 ENCOUNTER — Encounter: Payer: Self-pay | Admitting: Podiatry

## 2016-03-06 ENCOUNTER — Ambulatory Visit (INDEPENDENT_AMBULATORY_CARE_PROVIDER_SITE_OTHER): Payer: Medicare Other | Admitting: Podiatry

## 2016-03-06 VITALS — BP 149/65 | HR 75 | Resp 18

## 2016-03-06 DIAGNOSIS — L84 Corns and callosities: Secondary | ICD-10-CM

## 2016-03-06 DIAGNOSIS — B351 Tinea unguium: Secondary | ICD-10-CM

## 2016-03-06 DIAGNOSIS — E1151 Type 2 diabetes mellitus with diabetic peripheral angiopathy without gangrene: Secondary | ICD-10-CM

## 2016-03-06 DIAGNOSIS — M79676 Pain in unspecified toe(s): Secondary | ICD-10-CM | POA: Diagnosis not present

## 2016-03-06 MED ORDER — FLUCONAZOLE 150 MG PO TABS: ORAL_TABLET | ORAL | 0 refills | Status: DC

## 2016-03-06 NOTE — Telephone Encounter (Signed)
Rx sent to pharmacy   

## 2016-03-06 NOTE — Telephone Encounter (Signed)
Dr. Thomasene LotGreen's patient called and stated that she is having Vaginal Itching and would like some Diflucan called into her pharmacy. Started yesterday. States it is a yeast infection. Please Advise.

## 2016-03-06 NOTE — Telephone Encounter (Signed)
Ok for diflucan 150mg  po daily today and repeat in 1 week #2 no rf

## 2016-03-06 NOTE — Patient Instructions (Signed)
There was slight bleeding after trimming a corn at the end of third right toe treated with antibiotic dressing and Band-Aid. Move the Band-Aid 1-3 days and apply topical antibiotic ointment and Band-Aid until a scab forms Wear the toe prop on the third right toe to take pressure off the tip of the third right toe  Diabetes and Foot Care Diabetes may cause you to have problems because of poor blood supply (circulation) to your feet and legs. This may cause the skin on your feet to become thinner, break easier, and heal more slowly. Your skin may become dry, and the skin may peel and crack. You may also have nerve damage in your legs and feet causing decreased feeling in them. You may not notice minor injuries to your feet that could lead to infections or more serious problems. Taking care of your feet is one of the most important things you can do for yourself. Follow these instructions at home:  Wear shoes at all times, even in the house. Do not go barefoot. Bare feet are easily injured.  Check your feet daily for blisters, cuts, and redness. If you cannot see the bottom of your feet, use a mirror or ask someone for help.  Wash your feet with warm water (do not use hot water) and mild soap. Then pat your feet and the areas between your toes until they are completely dry. Do not soak your feet as this can dry your skin.  Apply a moisturizing lotion or petroleum jelly (that does not contain alcohol and is unscented) to the skin on your feet and to dry, brittle toenails. Do not apply lotion between your toes.  Trim your toenails straight across. Do not dig under them or around the cuticle. File the edges of your nails with an emery board or nail file.  Do not cut corns or calluses or try to remove them with medicine.  Wear clean socks or stockings every day. Make sure they are not too tight. Do not wear knee-high stockings since they may decrease blood flow to your legs.  Wear shoes that fit  properly and have enough cushioning. To break in new shoes, wear them for just a few hours a day. This prevents you from injuring your feet. Always look in your shoes before you put them on to be sure there are no objects inside.  Do not cross your legs. This may decrease the blood flow to your feet.  If you find a minor scrape, cut, or break in the skin on your feet, keep it and the skin around it clean and dry. These areas may be cleansed with mild soap and water. Do not cleanse the area with peroxide, alcohol, or iodine.  When you remove an adhesive bandage, be sure not to damage the skin around it.  If you have a wound, look at it several times a day to make sure it is healing.  Do not use heating pads or hot water bottles. They may burn your skin. If you have lost feeling in your feet or legs, you may not know it is happening until it is too late.  Make sure your health care provider performs a complete foot exam at least annually or more often if you have foot problems. Report any cuts, sores, or bruises to your health care provider immediately. Contact a health care provider if:  You have an injury that is not healing.  You have cuts or breaks in the skin.  You have an ingrown nail.  You notice redness on your legs or feet.  You feel burning or tingling in your legs or feet.  You have pain or cramps in your legs and feet.  Your legs or feet are numb.  Your feet always feel cold. Get help right away if:  There is increasing redness, swelling, or pain in or around a wound.  There is a red line that goes up your leg.  Pus is coming from a wound.  You develop a fever or as directed by your health care provider.  You notice a bad smell coming from an ulcer or wound. This information is not intended to replace advice given to you by your health care provider. Make sure you discuss any questions you have with your health care provider. Document Released: 02/16/2000 Document  Revised: 07/27/2015 Document Reviewed: 07/28/2012 Elsevier Interactive Patient Education  2017 [employer].

## 2016-03-07 NOTE — Progress Notes (Signed)
Patient ID: Jessica Hawkins, female   DOB: Jan 25, 1923, 83 y.o.   MRN: [id number]    Subjective: This patient presents again today complaining of thick and long toenails are strong comfortable walking wearing shoes and requests nail debridement. The patient is complaining of discomfort in the distal third right toe. She describes some temporary relief when the corn is debrided, however, 1-2 weeks after debridement she complains of pain in the distal third right toe. Patient's daughter who is present in the treatment room describes picking at this corn with her fingernails to reduce the bulk. They would like to know if there is another option to reduce the discomfort in this area   Objective: Pitting edema bilaterally Palpable pedal pulses bilaterally Sensation to 10 g monofilament wire intact 3/5 bilaterally Vibratory sensation reactive bilaterally Ankle reflexes reactive bilaterally HAV bilaterally Hammertoe 2-4 bilaterally The toenails are extremely elongated, brittle, deformed, discolored and tender to palpation 6-10 No open skin lesions bilaterally Dorsi flexion, plantar flexion 5/5 right and 5/5 left Corn third right toe with dried blood within the corn. There is no active drainage from the corn on the distal third right toe.  Assessment: Type II diabetic with a history of peripheral arterial disease and peripheral neuropathy Hammertoe third left with pre-ulcerative corn distal third left toe Symptomatic onychomycoses 6-10  Plan: Debridement toenails 6-10 mechanically and electrically without any bleeding Debride corn third right toe with slight bleeding about the periphery after debridement. Treated with topical antibiotic ointment and Band-Aid. Patient instructed to remove Band-Aid 1-3 days and continue apply topical antibiotic ointment and Band-Aid until a scab forms Dispensed toe prop to elevate toes 2-4 right with wearing instruction provided Patient stated at discharge with  weightbearing that she notices significant reduction of discomfort in the distal third right toe   Return intervals recommended at 3 months or sooner if patient or patient's daughter has concern

## 2016-03-08 ENCOUNTER — Telehealth: Payer: Self-pay | Admitting: *Deleted

## 2016-03-08 NOTE — Telephone Encounter (Signed)
How long has BP been running high? Recommend start amlodipine 2.5mg  #30 take 1 po daily with 2RF; f/w with Dr Chilton SiGreen as scheduled

## 2016-03-08 NOTE — Telephone Encounter (Signed)
Dr. Thomasene Hawkins's patient's caregiver, Jessica Hawkins called and left message on Clinical Intake Line and stated that patient's blood pressure,top number, has been running alittle high at 170. She stated that patient is normally around 150-158. Would like to know when should she be concerned and how long should it run this high. Please Advise.

## 2016-03-08 NOTE — Telephone Encounter (Signed)
LMOM for Jessica Hawkins to Return call.

## 2016-03-11 ENCOUNTER — Inpatient Hospital Stay (HOSPITAL_COMMUNITY): Admission: RE | Admit: 2016-03-11 | Payer: Medicare Other | Source: Ambulatory Visit | Admitting: Orthopedic Surgery

## 2016-03-11 ENCOUNTER — Encounter (HOSPITAL_COMMUNITY): Admission: RE | Payer: Self-pay | Source: Ambulatory Visit

## 2016-03-11 DIAGNOSIS — D500 Iron deficiency anemia secondary to blood loss (chronic): Secondary | ICD-10-CM | POA: Diagnosis not present

## 2016-03-11 SURGERY — ARTHROPLASTY, KNEE, TOTAL
Anesthesia: Spinal | Site: Knee | Laterality: Right

## 2016-03-11 NOTE — Telephone Encounter (Signed)
Spoke with Marylene LandAngela and she stated patient's blood pressures are better. Is keeping a log and will call back if increases.  03/11/16-156/60 03/10/16- 130/80 03/09/16- 122/60 03/08/16- 121/58

## 2016-03-19 ENCOUNTER — Ambulatory Visit (INDEPENDENT_AMBULATORY_CARE_PROVIDER_SITE_OTHER): Payer: Medicare Other | Admitting: Internal Medicine

## 2016-03-19 ENCOUNTER — Encounter: Payer: Self-pay | Admitting: Internal Medicine

## 2016-03-19 VITALS — BP 146/72 | HR 80 | Temp 97.4°F | Ht 63.0 in | Wt 146.0 lb

## 2016-03-19 DIAGNOSIS — D649 Anemia, unspecified: Secondary | ICD-10-CM | POA: Diagnosis not present

## 2016-03-19 DIAGNOSIS — Z20828 Contact with and (suspected) exposure to other viral communicable diseases: Secondary | ICD-10-CM

## 2016-03-19 DIAGNOSIS — L03116 Cellulitis of left lower limb: Secondary | ICD-10-CM

## 2016-03-19 DIAGNOSIS — R413 Other amnesia: Secondary | ICD-10-CM

## 2016-03-19 DIAGNOSIS — E1151 Type 2 diabetes mellitus with diabetic peripheral angiopathy without gangrene: Secondary | ICD-10-CM

## 2016-03-19 DIAGNOSIS — M25569 Pain in unspecified knee: Secondary | ICD-10-CM | POA: Diagnosis not present

## 2016-03-19 DIAGNOSIS — I10 Essential (primary) hypertension: Secondary | ICD-10-CM

## 2016-03-19 DIAGNOSIS — R609 Edema, unspecified: Secondary | ICD-10-CM

## 2016-03-19 DIAGNOSIS — R4189 Other symptoms and signs involving cognitive functions and awareness: Secondary | ICD-10-CM | POA: Insufficient documentation

## 2016-03-19 LAB — RETICULOCYTES
ABS RETIC: 45920 {cells}/uL (ref 20000–80000)
RBC.: 3.28 MIL/uL — AB (ref 3.80–5.10)
RETIC CT PCT: 1.4 %

## 2016-03-19 LAB — CBC WITH DIFFERENTIAL/PLATELET
BASOS PCT: 0 %
Basophils Absolute: 0 cells/uL (ref 0–200)
EOS PCT: 2 %
Eosinophils Absolute: 118 cells/uL (ref 15–500)
HCT: 26.9 % — ABNORMAL LOW (ref 35.0–45.0)
HEMOGLOBIN: 8.5 g/dL — AB (ref 11.7–15.5)
LYMPHS ABS: 1475 {cells}/uL (ref 850–3900)
Lymphocytes Relative: 25 %
MCH: 25.9 pg — ABNORMAL LOW (ref 27.0–33.0)
MCHC: 31.6 g/dL — AB (ref 32.0–36.0)
MCV: 82 fL (ref 80.0–100.0)
MONO ABS: 708 {cells}/uL (ref 200–950)
MPV: 9.5 fL (ref 7.5–12.5)
Monocytes Relative: 12 %
NEUTROS ABS: 3599 {cells}/uL (ref 1500–7800)
NEUTROS PCT: 61 %
Platelets: 310 10*3/uL (ref 140–400)
RBC: 3.28 MIL/uL — AB (ref 3.80–5.10)
RDW: 15.5 % — ABNORMAL HIGH (ref 11.0–15.0)
WBC: 5.9 10*3/uL (ref 3.8–10.8)

## 2016-03-19 MED ORDER — OSELTAMIVIR PHOSPHATE 75 MG PO CAPS: ORAL_CAPSULE | ORAL | 0 refills | Status: DC

## 2016-03-19 MED ORDER — DOXYCYCLINE HYCLATE 100 MG PO TABS: 100.0000 mg | ORAL_TABLET | Freq: Two times a day (BID) | ORAL | 0 refills | Status: DC

## 2016-03-19 NOTE — Patient Instructions (Addendum)
We will consider adding medication for your memory after surgery (donepezil, memantine).Oseltamivir capsules What is this medicine? OSELTAMIVIR (os el TAM i vir) is an antiviral medicine. It is used to prevent and to treat some kinds of influenza or the flu. It will not work for colds or other viral infections. This medicine may be used for other purposes; ask your health care provider or pharmacist if you have questions. COMMON BRAND NAME(S): Tamiflu What should I tell my health care provider before I take this medicine? They need to know if you have any of the following conditions: -heart disease -immune system problems -kidney disease -liver disease -lung disease -an unusual or allergic reaction to oseltamivir, other medicines, foods, dyes, or preservatives -pregnant or trying to get pregnant -breast-feeding How should I use this medicine? Take this medicine by mouth with a glass of water. Follow the directions on the prescription label. Start this medicine at the first sign of flu symptoms. You can take it with or without food. If it upsets your stomach, take it with food. Take your medicine at regular intervals. Do not take your medicine more often than directed. Take all of your medicine as directed even if you think you are better. Do not skip doses or stop your medicine early. Talk to your pediatrician regarding the use of this medicine in children. While this drug may be prescribed for children as young as 14 days for selected conditions, precautions do apply. Overdosage: If you think you have taken too much of this medicine contact a poison control center or emergency room at once. NOTE: This medicine is only for you. Do not share this medicine with others. What if I miss a dose? If you miss a dose, take it as soon as you remember. If it is almost time for your next dose (within 2 hours), take only that dose. Do not take double or extra doses. What may interact with this  medicine? Interactions are not expected. This list may not describe all possible interactions. Give your health care provider a list of all the medicines, herbs, non-prescription drugs, or dietary supplements you use. Also tell them if you smoke, drink alcohol, or use illegal drugs. Some items may interact with your medicine. What should I watch for while using this medicine? Visit your doctor or health care professional for regular check ups. Tell your doctor if your symptoms do not start to get better or if they get worse. If you have the flu, you may be at an increased risk of developing seizures, confusion, or abnormal behavior. This occurs early in the illness, and more frequently in children and teens. These events are not common, but may result in accidental injury to the patient. Families and caregivers of patients should watch for signs of unusual behavior and contact a doctor or health care professional right away if the patient shows signs of unusual behavior. This medicine is not a substitute for the flu shot. Talk to your doctor each year about an annual flu shot. What side effects may I notice from receiving this medicine? Side effects that you should report to your doctor or health care professional as soon as possible: -allergic reactions like skin rash, itching or hives, swelling of the face, lips, or tongue -anxiety, confusion, unusual behavior -breathing problems -hallucination, loss of contact with reality -redness, blistering, peeling or loosening of the skin, including inside the mouth -seizures Side effects that usually do not require medical attention (report to your doctor or health  care professional if they continue or are bothersome): -diarrhea -headache -nausea, vomiting -pain This list may not describe all possible side effects. Call your doctor for medical advice about side effects. You may report side effects to FDA at 1-800-FDA-1088. Where should I keep my  medicine? Keep out of the reach of children. Store at room temperature between 15 and 30 degrees C (59 and 86 degrees F). Throw away any unused medicine after the expiration date. NOTE: This sheet is a summary. It may not cover all possible information. If you have questions about this medicine, talk to your doctor, pharmacist, or health care provider.  2017 Elsevier/Gold Standard (2014-08-24 10:50:39) Doxycycline delayed-release capsules What is this medicine? DOXYCYCLINE (dox i SYE kleen) is a tetracycline antibiotic. It is used to treat certain kinds of bacterial infections, Lyme disease, and malaria. It will not work for colds, flu, or other viral infections. This medicine may be used for other purposes; ask your health care provider or pharmacist if you have questions. COMMON BRAND NAME(S): Oracea What should I tell my health care provider before I take this medicine? They need to know if you have any of these conditions: -bowel disease like colitis -liver disease -long exposure to sunlight like working outdoors -an unusual or allergic reaction to doxycycline, tetracycline antibiotics, other medicines, foods, dyes, or preservatives -pregnant or trying to get pregnant -breast-feeding How should I use this medicine? Take this medicine by mouth with a full glass of water. Follow the directions on the prescription label. Do not crush or chew. The capsules may be opened and the pellets sprinkled on applesauce. Swallow the pellets whole without chewing. Follow with an 8 ounce glass of water to help you swallow all the pellets. Do not prepare a dose and store for later use. The applesauce mixture should be taken immediately after you prepare it. It is best to take this medicine without other food, but if it upsets your stomach take it with food. Take your medicine at regular intervals. Do not take your medicine more often than directed. Take all of your medicine as directed even if you think your  are better. Do not skip doses or stop your medicine early. Talk to your pediatrician regarding the use of this medicine in children. While this drug may be prescribed for selected conditions, precautions do apply. Overdosage: If you think you have taken too much of this medicine contact a poison control center or emergency room at once. NOTE: This medicine is only for you. Do not share this medicine with others. What if I miss a dose? If you miss a dose, take it as soon as you can. If it is almost time for your next dose, take only that dose. Do not take double or extra doses. What may interact with this medicine? -antacids -barbiturates -birth control pills -bismuth subsalicylate -carbamazepine -methoxyflurane -other antibiotics -phenytoin -vitamins that contain iron -warfarin This list may not describe all possible interactions. Give your health care provider a list of all the medicines, herbs, non-prescription drugs, or dietary supplements you use. Also tell them if you smoke, drink alcohol, or use illegal drugs. Some items may interact with your medicine. What should I watch for while using this medicine? Tell your doctor or health care professional if your symptoms do not improve. Do not treat diarrhea with over the counter products. Contact your doctor if you have diarrhea that lasts more than 2 days or if it is severe and watery. Do not take this medicine  just before going to bed. It may not dissolve properly when you lay down and can cause pain in your throat. Drink plenty of fluids while taking this medicine to also help reduce irritation in your throat. This medicine can make you more sensitive to the sun. Keep out of the sun. If you cannot avoid being in the sun, wear protective clothing and use sunscreen. Do not use sun lamps or tanning beds/booths. If you are being treated for a sexually transmitted infection, avoid sexual contact until you have finished your treatment. Your  sexual partner may also need treatment. Avoid antacids, aluminum, calcium, magnesium, and iron products for 4 hours before and 2 hours after taking a dose of this medicine. Birth control pills may not work properly while you are taking this medicine. Talk to your doctor about using an extra method of birth control. If you are using this medicine to prevent malaria, you should still protect yourself from contact with mosquitos. Stay in screened-in areas, use mosquito nets, keep your body covered, and use an insect repellent. What side effects may I notice from receiving this medicine? Side effects that you should report to your doctor or health care professional as soon as possible: -allergic reactions like skin rash, itching or hives, swelling of the face, lips, or tongue -difficulty breathing -fever -itching in the rectal or genital area -pain on swallowing -redness, blistering, peeling or loosening of the skin, including inside the mouth -severe stomach pain or cramps -unusual bleeding or bruising -unusually weak or tired -yellowing of the eyes or skin Side effects that usually do not require medical attention (report to your doctor or health care professional if they continue or are bothersome): -diarrhea -loss of appetite -nausea, vomiting This list may not describe all possible side effects. Call your doctor for medical advice about side effects. You may report side effects to FDA at 1-800-FDA-1088. Where should I keep my medicine? Keep out of the reach of children. Store at room temperature, below 25 degrees C (77 degrees F). Protect from light. Keep container tightly closed. Throw away any unused medicine after the expiration date. Taking this medicine after the expiration date can make you seriously ill. NOTE: This sheet is a summary. It may not cover all possible information. If you have questions about this medicine, talk to your doctor, pharmacist, or health care provider.  2017  Elsevier/Gold Standard (2015-03-23 10:41:39)

## 2016-03-19 NOTE — Progress Notes (Signed)
Facility  [place]    Place of Service:   OFFICE    No Known Allergies  Chief Complaint  Patient presents with  . Medical Management of Chronic Issues    to talk about memory medication. Memory not as good as it was. Here with daughter Olin Hauser, care taker Levada Dy., care taker Baxter Flattery.  . Tamiflu    daughter has flu, they want patient to get Tamiflu   . Leg Swelling    right leg some swelling, wants checked   . Medical Clearance    right knee surgery.   Marland Kitchen MMSE    26/30 passed clock drawing    HPI:  Anemia, unspecified type - surgeru pon knee has been postponed until anemia is corrected. Hgb was up to 8.9 on 03/01/16. No obvious signs of blood loss. She has been holding her iron tablets.  Exposure to influenza - would like Tamiflu prophylaxis  Edema, unspecified type - increased. Not elevating legs as she is supposed to do.   Essential hypertension - controlled  Arthralgia of lower leg, unspecified laterality - bone on bone pains.  Controlled type 2 DM with peripheral circulatory disorder ([place]) - controlled  Memory is getting worse per patient and family. She would like to be considered for medication after her surgery.  Medications: Patient's Medications  New Prescriptions   No medications on file  Previous Medications   ASPIRIN EC 81 MG TABLET    Take 81 mg by mouth daily.   EMOLLIENT (UDDERLY SMOOTH) CREA    Apply 1 application topically 2 (two) times daily as needed (for dry skin related to healing cellulitis).   FERROUS SULFATE 325 (65 FE) MG TABLET    Take 1 tablet (325 mg total) by mouth 3 (three) times daily with meals.   FOLIC ACID (FOLVITE) 1 MG TABLET    Take 1 tablet (1 mg total) by mouth daily.   FUROSEMIDE (LASIX) 40 MG TABLET    Take one tablet by mouth once daily for edema   LEVOTHYROXINE (SYNTHROID, LEVOTHROID) 25 MCG TABLET    Take one tablet by mouth once daily for thyroid supplement   MULTIPLE VITAMIN (MULTIVITAMIN WITH MINERALS) TABS TABLET    Take 1  tablet by mouth daily. Centrum   SACCHAROMYCES BOULARDII (FLORASTOR) 250 MG CAPSULE    Take 1 capsule (250 mg total) by mouth 2 (two) times daily.  Modified Medications   No medications on file  Discontinued Medications   FLUCONAZOLE (DIFLUCAN) 150 MG TABLET    Take one tablet by mouth today and repeat in 1 week    Review of Systems  Constitutional: Positive for activity change. Negative for appetite change, chills, fatigue, fever and unexpected weight change.  Respiratory: Negative for cough, shortness of breath and wheezing.   Cardiovascular: Positive for leg swelling. Negative for chest pain and palpitations.  Gastrointestinal: Negative for abdominal distention, abdominal pain, constipation, diarrhea and vomiting.       Incontinent of stool and urine sometimes  Endocrine:       Diabetic  Genitourinary:       Hx of yeast infections  Musculoskeletal: Positive for arthralgias (Right knee and right shoulder), gait problem and myalgias.       Right shoulder pain. Right knee pain and weakness.  Skin: Negative.   Neurological: Positive for weakness (Left leg) and numbness (Left foot).       Left foot drop. Mild memory deficit.  Psychiatric/Behavioral: Positive for confusion (answers must be repeated seveeral times  for her.).    Vitals:   03/19/16 1629  BP: (!) 146/72  Pulse: 80  Temp: 97.4 F (36.3 C)  TempSrc: Oral  Weight: 146 lb (66.2 kg)  Height: '5\' 3"'  (1.6 m)   Body mass index is 25.86 kg/m. Wt Readings from Last 3 Encounters:  03/19/16 146 lb (66.2 kg)  02/29/16 142 lb 6.4 oz (64.6 kg)  01/31/16 139 lb 8 oz (63.3 kg)      Physical Exam  Constitutional: She is oriented to person, place, and time. She appears well-developed and well-nourished. No distress.  frail  HENT:  Head: Normocephalic and atraumatic.  Eyes: Conjunctivae and EOM are normal. Pupils are equal, round, and reactive to light.  Neck: Normal range of motion. Neck supple. No JVD present.    Cardiovascular: Normal rate, regular rhythm, normal heart sounds and intact distal pulses.   Varicose veins.  Pulmonary/Chest: Effort normal and breath sounds normal. No respiratory distress. She has no wheezes. She has no rales.  Abdominal: Soft. Bowel sounds are normal.  Musculoskeletal: She exhibits edema (1+ right and 2+ to left) and tenderness (to left leg and calf).  Left foot drop. Significant weakness with attempts at dorsiflexion. Negative homans sign.  Lymphadenopathy:       Left: Inguinal adenopathy present.  Neurological: She is alert and oriented to person, place, and time. Coordination normal.  Decreased sensation left foot. Left foot drop and weakness with dorsiflexion. Memory deficit. 03/19/16 MMSE 26/30. Passed clock drawing.  Skin:  erythema of the left leg in a 3 x 4 inch patch at the lower left leg anterolaterally. This area is moderately tender.  Psychiatric: She has a normal mood and affect. Her behavior is normal.    Labs reviewed: Lab Summary Latest Ref Rng & Units 02/29/2016 02/08/2016 02/07/2016 02/04/2016 02/03/2016 02/02/2016  Hemoglobin 11.7 - 15.5 g/dL 8.9(L) 7.6(L) 7.9(L) (None) (None) (None)  Hematocrit 35.0 - 45.0 % 28.5(L) 23.8(L) 24.6(L) (None) (None) (None)  White count 3.8 - 10.8 K/uL 5.3 6.6 6.7 (None) (None) (None)  Platelet count 140 - 400 K/uL 241 324 289 (None) (None) (None)  Sodium 135 - 146 mmol/L 138 (None) 138 132(L) 136 135  Potassium 3.5 - 5.3 mmol/L 4.3 (None) 3.9 4.0 3.9 3.6  Calcium 8.6 - 10.4 mg/dL 9.1 (None) 8.3(L) 8.1(L) 8.1(L) 8.2(L)  Phosphorus - (None) (None) (None) (None) (None) (None)  Creatinine 0.60 - 0.88 mg/dL 1.20(H) (None) 1.46(H) 1.38(H) 1.48(H) 1.58(H)  AST - (None) (None) (None) (None) (None) (None)  Alk Phos - (None) (None) (None) (None) (None) (None)  Bilirubin - (None) (None) (None) (None) (None) (None)  Glucose 65 - 99 mg/dL 119(H) (None) 110(H) 123(H) 113(H) 120(H)  Cholesterol - (None) (None) (None) (None) (None)  (None)  HDL cholesterol - (None) (None) (None) (None) (None) (None)  Triglycerides - (None) (None) (None) (None) (None) (None)  LDL Direct - (None) (None) (None) (None) (None) (None)  LDL Calc - (None) (None) (None) (None) (None) (None)  Total protein - (None) (None) (None) (None) (None) (None)  Albumin - (None) (None) (None) (None) (None) (None)  Some recent data might be hidden   Lab Results  Component Value Date   TSH 1.030 07/21/2015   TSH 0.755 12/02/2014   TSH 1.040 05/05/2014   Lab Results  Component Value Date   BUN 32 (H) 02/29/2016   BUN 22 (H) 02/07/2016   BUN 24 (H) 02/04/2016   Lab Results  Component Value Date   HGBA1C 5.9 (H) 12/20/2015   HGBA1C  6.0 (H) 07/21/2015   HGBA1C 6.1 (H) 05/19/2015    Assessment/Plan  1. Anemia, unspecified type - CBC with Differential/Platelet - Reticulocytes  2. Exposure to influenza - oseltamivir (TAMIFLU) 75 MG capsule; Take one capsule daily to prevent the flu  Dispense: 10 capsule; Refill: 0  3. Edema, unspecified type Continue compression stockings, leg elevation, and furosemide  4. Essential hypertension controlled  5. Arthralgia of lower leg, unspecified laterality Schedule surgery after anemia is improved. She thinks it has been rescheduled for about 03/29/16/  6. Controlled type 2 DM with peripheral circulatory disorder [facility]) The current medical regimen is effective;  continue present plan and medications.  7. Cellulitis of left lower extremity - doxycycline (VIBRA-TABS) 100 MG tablet; Take 1 tablet (100 mg total) by mouth 2 (two) times daily.  Dispense: 20 tablet; Refill: 0  8. Memory deficit Following surgery, start donepezil 5 mg and progress to 10 mg qd after 4 weeks.

## 2016-03-24 ENCOUNTER — Encounter: Payer: Self-pay | Admitting: Internal Medicine

## 2016-03-24 DIAGNOSIS — D649 Anemia, unspecified: Secondary | ICD-10-CM

## 2016-03-25 NOTE — Telephone Encounter (Signed)
Patient daughter, Flora Lippsamela Banks left message with Aram BeechamCynthia that she would like for you to call her #[phone] cell or #[phone] home

## 2016-03-26 ENCOUNTER — Encounter: Payer: Self-pay | Admitting: Internal Medicine

## 2016-03-26 MED ORDER — CLINDAMYCIN HCL 300 MG PO CAPS: 300.0000 mg | ORAL_CAPSULE | Freq: Three times a day (TID) | ORAL | 0 refills | Status: DC

## 2016-03-26 NOTE — Addendum Note (Signed)
Addended by: Chriss DriverLANE, Farhana Fellows L on: 03/26/2016 01:53 PM   Modules accepted: Orders

## 2016-03-26 NOTE — Telephone Encounter (Signed)
Order placed for repeat CBC

## 2016-03-27 ENCOUNTER — Other Ambulatory Visit: Payer: Self-pay | Admitting: Internal Medicine

## 2016-03-28 ENCOUNTER — Telehealth: Payer: Self-pay

## 2016-03-28 NOTE — Telephone Encounter (Signed)
Message left on clinical intake voicemail: Patient was prescribed ABX and the bottle states take TID but the insert provided by the pharmacy is QID, needs clarity  Patient is also feeling much better and questions if she should take antibiotic at all, please advise

## 2016-03-28 NOTE — Telephone Encounter (Signed)
Spoke with patient's caregiver Enid Derryngela, Angela verbalized understanding of Dr.Green's response

## 2016-03-28 NOTE — Telephone Encounter (Signed)
I generally prescribe clindamycin 3 times daily. Please go by that direction.  If her leg is no longer swollen and red, then I would recommend that she finish the doxycycline that she was started on and put the clindamycin aside. We can use it in the future if she relapses.

## 2016-03-29 ENCOUNTER — Telehealth: Payer: Self-pay

## 2016-03-29 NOTE — Telephone Encounter (Addendum)
Jessica Hawkins called to get some questions answered for the family  1) Why was doxycycline given, I informed Jessica Hawkins based on OV notes from 03/19/16 doxycycline was prescribed for cellulitis.  2.) Patient's symptoms improved- ? Should she continue antibiotic  I informed patient's caregiver that as Dr.Green stated in phone note dated 03/28/16 patient is to continue doxycycline

## 2016-04-02 ENCOUNTER — Telehealth: Payer: Self-pay | Admitting: *Deleted

## 2016-04-02 ENCOUNTER — Other Ambulatory Visit: Payer: Medicare Other

## 2016-04-02 DIAGNOSIS — D649 Anemia, unspecified: Secondary | ICD-10-CM

## 2016-04-02 LAB — CBC WITH DIFFERENTIAL/PLATELET
BASOS ABS: 0 {cells}/uL (ref 0–200)
Basophils Relative: 0 %
EOS ABS: 48 {cells}/uL (ref 15–500)
EOS PCT: 1 %
HCT: 28.4 % — ABNORMAL LOW (ref 35.0–45.0)
HEMOGLOBIN: 8.9 g/dL — AB (ref 11.7–15.5)
LYMPHS ABS: 1488 {cells}/uL (ref 850–3900)
Lymphocytes Relative: 31 %
MCH: 25.8 pg — ABNORMAL LOW (ref 27.0–33.0)
MCHC: 31.3 g/dL — AB (ref 32.0–36.0)
MCV: 82.3 fL (ref 80.0–100.0)
MPV: 10.4 fL (ref 7.5–12.5)
Monocytes Absolute: 384 cells/uL (ref 200–950)
Monocytes Relative: 8 %
NEUTROS PCT: 60 %
Neutro Abs: 2880 cells/uL (ref 1500–7800)
Platelets: 259 10*3/uL (ref 140–400)
RBC: 3.45 MIL/uL — ABNORMAL LOW (ref 3.80–5.10)
RDW: 15.4 % — ABNORMAL HIGH (ref 11.0–15.0)
WBC: 4.8 10*3/uL (ref 3.8–10.8)

## 2016-04-02 NOTE — Telephone Encounter (Signed)
Patient can stop the probiotic. Stockings should be knee high and large.

## 2016-04-02 NOTE — Telephone Encounter (Signed)
Marylene LandAngela, Caregiver notified and agreed.

## 2016-04-02 NOTE — Telephone Encounter (Signed)
Patient caregivers, Jessica Hawkins and Jessica Hawkins and patient walked into office and wanted to know:  1. Can patient stop Probiotic?  2. What size should patient wear in stockings and should they be knee high or thigh high?  3. Copy of Iron test results to give to Dr. Tharon Aquason. Printed a copy of labs and gave to patient.

## 2016-04-03 ENCOUNTER — Other Ambulatory Visit: Payer: Self-pay

## 2016-04-03 ENCOUNTER — Other Ambulatory Visit: Payer: Self-pay | Admitting: Internal Medicine

## 2016-04-03 DIAGNOSIS — D649 Anemia, unspecified: Secondary | ICD-10-CM

## 2016-04-03 DIAGNOSIS — R609 Edema, unspecified: Secondary | ICD-10-CM

## 2016-04-03 MED ORDER — FOLIC ACID 1 MG PO TABS: 1.0000 mg | ORAL_TABLET | Freq: Every day | ORAL | 2 refills | Status: AC

## 2016-04-03 MED ORDER — FUROSEMIDE 40 MG PO TABS: ORAL_TABLET | ORAL | 2 refills | Status: DC

## 2016-04-08 ENCOUNTER — Other Ambulatory Visit: Payer: Self-pay | Admitting: *Deleted

## 2016-04-08 ENCOUNTER — Other Ambulatory Visit: Payer: Self-pay

## 2016-04-08 ENCOUNTER — Ambulatory Visit (INDEPENDENT_AMBULATORY_CARE_PROVIDER_SITE_OTHER): Payer: Medicare Other | Admitting: Nurse Practitioner

## 2016-04-08 ENCOUNTER — Encounter: Payer: Self-pay | Admitting: Nurse Practitioner

## 2016-04-08 VITALS — BP 142/60 | HR 83 | Temp 97.8°F | Resp 18 | Ht 63.0 in | Wt 145.4 lb

## 2016-04-08 DIAGNOSIS — D649 Anemia, unspecified: Secondary | ICD-10-CM | POA: Diagnosis not present

## 2016-04-08 DIAGNOSIS — R040 Epistaxis: Secondary | ICD-10-CM

## 2016-04-08 DIAGNOSIS — I10 Essential (primary) hypertension: Secondary | ICD-10-CM

## 2016-04-08 LAB — CBC WITH DIFFERENTIAL/PLATELET
BASOS ABS: 0 {cells}/uL (ref 0–200)
BASOS PCT: 0 %
EOS ABS: 55 {cells}/uL (ref 15–500)
Eosinophils Relative: 1 %
HEMATOCRIT: 26.5 % — AB (ref 35.0–45.0)
Hemoglobin: 8.3 g/dL — ABNORMAL LOW (ref 11.7–15.5)
Lymphocytes Relative: 32 %
Lymphs Abs: 1760 cells/uL (ref 850–3900)
MCH: 25.8 pg — AB (ref 27.0–33.0)
MCHC: 31.3 g/dL — ABNORMAL LOW (ref 32.0–36.0)
MCV: 82.3 fL (ref 80.0–100.0)
MONO ABS: 605 {cells}/uL (ref 200–950)
MPV: 9.9 fL (ref 7.5–12.5)
Monocytes Relative: 11 %
NEUTROS ABS: 3080 {cells}/uL (ref 1500–7800)
Neutrophils Relative %: 56 %
Platelets: 256 10*3/uL (ref 140–400)
RBC: 3.22 MIL/uL — ABNORMAL LOW (ref 3.80–5.10)
RDW: 15.3 % — ABNORMAL HIGH (ref 11.0–15.0)
WBC: 5.5 10*3/uL (ref 3.8–10.8)

## 2016-04-08 NOTE — Progress Notes (Signed)
Careteam: Patient Care Team: Kimber Relic, MD as PCP - General (Internal Medicine) Vida Rigger, MD as Consulting Physician (Gastroenterology) Marcine Matar, MD as Consulting Physician (Urology) Dominica Severin, MD as Consulting Physician (Orthopedic Surgery) Janalyn Harder, MD as Consulting Physician (Dermatology) Teryl Lucy, MD as Consulting Physician (Orthopedic Surgery)  Advanced Directive information Does Patient Have a Medical Advance Directive?: Yes, Type of Advance Directive: Healthcare Power of [place];Living will  No Known Allergies  Chief Complaint  Patient presents with  . Acute Visit    BP elevated for several days     HPI: Patient is a 83 y.o. female seen in the office today due to elevated blood pressure. Pt reports she was taken off her blood pressure medication and home blood pressures are too high. Appears pt was taken off blood pressure due to low blood pressure. She was passing out per caregiver due to low blood pressure. Was told that if sbp<170 that was okay.  Blood pressure- ranging from 118-140/58-83 occasionally blood pressure 162/58,160/74 Pt needs follow up CBC, last hgb was 8.9 but pt wanting surgery on right knee and needs to be over 10 prior to surgery Reports she is having nose bleeds. 2 this weekend. Mostly at night.   Review of Systems:  Review of Systems  Constitutional: Negative for activity change, appetite change, chills, fatigue, fever and unexpected weight change.  HENT: Positive for nosebleeds.   Respiratory: Negative for cough, shortness of breath and wheezing.   Cardiovascular: Positive for leg swelling (stable). Negative for chest pain and palpitations.  Gastrointestinal: Negative for abdominal distention, constipation, diarrhea, nausea and vomiting.       Incontinent of stool and urine sometimes  Skin: Negative.   Neurological: Negative for dizziness and light-headedness.       Left foot drop.    Past Medical History:    Diagnosis Date  . Acute blood loss anemia   . Anginal pain [facility])    'i haven't had them in a long time but i used to'  . Arthritis    Osteoarthritis  . BBB (bundle branch block)    RT  . Coronary atherosclerosis of unspecified type of vessel, native or graft   . Debility, unspecified   . Diabetes mellitus without complication (HCC)    Diet controlled  . Diaphragmatic hernia without mention of obstruction or gangrene   . Disturbance of skin sensation   . Edema   . Fibrocystic breast   . Gait disorder   . GERD (gastroesophageal reflux disease) 06/29/2011  . Hiatal hernia   . History of transfusion of packed red blood cells   . Hyperlipidemia   . Hypertension 06/29/11  . Hypothyroid 06/29/2011  . Insomnia   . OAB (overactive bladder)   . Osteoporosis   . Platelet disorder (HCC)    ? Von Willebrand; abnormal PLT function assay 07/04/11   . Unspecified constipation   . Unspecified sleep apnea   . Unspecified urinary incontinence   . Unspecified vitamin D deficiency   . Unsteady gait   . Vertigo    Past Surgical History:  Procedure Laterality Date  . EYE SURGERY Right 2005   Cataract surgery  . EYE SURGERY Left 2006   Cataract surgery  . FEMUR IM NAIL  06/29/2011   Procedure: INTRAMEDULLARY (IM) NAIL FEMORAL;  Surgeon: Verlee Rossetti, MD;  Location: [facility] OR;  Service: Orthopedics;  Laterality: Left;  . HIP SURGERY Right 2002   ORIF  . ROTATOR CUFF REPAIR Left  1999   Tear  . THYROIDECTOMY  1952  . TONSILLECTOMY    . TOTAL KNEE ARTHROPLASTY Left 05/29/2015   Procedure: TOTAL KNEE ARTHROPLASTY;  Surgeon: Dannielle HuhSteve Lucey, MD;  Location: [facility] OR;  Service: Orthopedics;  Laterality: Left;   Social History:   reports that she has never smoked. She has never used smokeless tobacco. She reports that she does not drink alcohol or use drugs.  Family History  Problem Relation Age of Onset  . Cancer Mother     Stomach  . Cancer Father     Prostate  . Emphysema Father   . Alcohol abuse  Sister   . Liver disease Sister   . Kidney disease Brother   . Hypertension Daughter   . Cancer Brother     Prostate, Bladder  . Emphysema Brother   . Cancer Brother     Prostate  . Arthritis Daughter     Knees  . Heart murmur Son   . Mental illness Son     Autism    Medications: Patient's Medications  New Prescriptions   No medications on file  Previous Medications   ASPIRIN EC 81 MG TABLET    Take 81 mg by mouth daily.   EMOLLIENT (UDDERLY SMOOTH) CREA    Apply 1 application topically 2 (two) times daily as needed (for dry skin related to healing cellulitis).   FERROUS SULFATE 325 (65 FE) MG TABLET    Take 1 tablet (325 mg total) by mouth 3 (three) times daily with meals.   FOLIC ACID (FOLVITE) 1 MG TABLET    Take 1 tablet (1 mg total) by mouth daily.   FUROSEMIDE (LASIX) 40 MG TABLET    Take one tablet by mouth once daily for edema   LEVOTHYROXINE (SYNTHROID, LEVOTHROID) 25 MCG TABLET    Take one tablet by mouth once daily for thyroid supplement   MULTIPLE VITAMIN (MULTIVITAMIN WITH MINERALS) TABS TABLET    Take 1 tablet by mouth daily. Centrum  Modified Medications   No medications on file  Discontinued Medications   OSELTAMIVIR (TAMIFLU) 75 MG CAPSULE    Take one capsule daily to prevent the flu     Physical Exam:  Vitals:   04/08/16 1325  BP: (!) 142/60  Pulse: 83  Resp: 18  Temp: 97.8 F (36.6 C)  TempSrc: Oral  SpO2: 97%  Weight: 145 lb 6.4 oz (66 kg)  Height: 5\' 3"  (1.6 m)   Body mass index is 25.76 kg/m.  Physical Exam  Constitutional: She is oriented to person, place, and time. She appears well-developed and well-nourished. No distress.  HENT:  Head: Normocephalic and atraumatic.  Eyes: Conjunctivae and EOM are normal. Pupils are equal, round, and reactive to light.  Neck: Normal range of motion. Neck supple.  Cardiovascular: Normal rate, regular rhythm and normal heart sounds.   Varicose veins.  Pulmonary/Chest: Effort normal and breath sounds  normal. No respiratory distress. She has no wheezes. She has no rales.  Abdominal: Soft. Bowel sounds are normal.  Musculoskeletal: She exhibits edema (2+ left leg). She exhibits no tenderness.  Left foot drop.   Neurological: She is alert and oriented to person, place, and time. Coordination normal.  Skin: Skin is warm and dry. She is not diaphoretic.    Labs reviewed: Basic Metabolic Panel:  Recent Labs  14/78/2905/19/17 0913  02/04/16 0312 02/07/16 0612 02/29/16 1537  NA 140  < > 132* 138 138  K 4.7  < > 4.0 3.9 4.3  CL 101  < > 100* 106 100  CO2 24  < > 23 22 27   GLUCOSE 109*  < > 123* 110* 119*  BUN 30  < > 24* 22* 32*  CREATININE 0.91  < > 1.38* 1.46* 1.20*  CALCIUM 9.2  < > 8.1* 8.3* 9.1  TSH 1.030  --   --   --   --   < > = values in this interval not displayed. Liver Function Tests:  Recent Labs  10/23/15 0408 12/20/15 1018 01/31/16 1400  AST 26 28 31   ALT 14 16 16   ALKPHOS 75 85 64  BILITOT 0.4 0.7 0.9  PROT 6.7 7.4 6.8  ALBUMIN 3.7 3.8 2.9*    Recent Labs  10/23/15 0408  LIPASE 25   No results for input(s): AMMONIA in the last 8760 hours. CBC:  Recent Labs  02/29/16 1537 03/19/16 1655 03/26/16 1107  WBC 5.3 5.9 4.8  NEUTROABS 3,286 3,599 2,880  HGB 8.9* 8.5* 8.9*  HCT 28.5* 26.9* 28.4*  MCV 82.8 82.0 82.3  PLT 241 310 259   Lipid Panel: No results for input(s): CHOL, HDL, LDLCALC, TRIG, CHOLHDL, LDLDIRECT in the last 8760 hours. TSH:  Recent Labs  07/21/15 0913  TSH 1.030   A1C: Lab Results  Component Value Date   HGBA1C 5.9 (H) 12/20/2015     Assessment/Plan 1. Anemia, unspecified type Will follow up CBC today  2. Essential hypertension Blood pressures reviewed and stable off medication. reassurance given   3. Epistaxis To use humidifier around the house due to being dry from heat To use nasal saline three times daily and as needed to moisten nasal membranes.      Janene Harvey. Biagio Borg  [facility] [phone] 8 am - 5 pm) [phone] (after hours)

## 2016-04-08 NOTE — Telephone Encounter (Signed)
Patient called and stated that her blood pressure has been running high and wants BP medication. Wanted an appointment. Scheduled today with Shanda BumpsJessica, none available for Dr. Chilton SiGreen.

## 2016-04-08 NOTE — Patient Instructions (Addendum)
To use humidifier around the house due to being dry from heat To use nasal saline three times daily and as needed to moisten nasal membranes.    You do not need blood pressure medication at this time.  Blood pressures have been reviewed at today visit and they are good.

## 2016-04-12 ENCOUNTER — Other Ambulatory Visit: Payer: Self-pay

## 2016-04-12 ENCOUNTER — Other Ambulatory Visit: Payer: Self-pay | Admitting: Internal Medicine

## 2016-04-12 ENCOUNTER — Telehealth: Payer: Self-pay | Admitting: *Deleted

## 2016-04-12 DIAGNOSIS — D649 Anemia, unspecified: Secondary | ICD-10-CM

## 2016-04-12 DIAGNOSIS — D729 Disorder of white blood cells, unspecified: Secondary | ICD-10-CM

## 2016-04-12 NOTE — Telephone Encounter (Signed)
Jessica Hawkins states pt was seen 1 month ago, corn was trimmed and bled at the time, but is just sore now. Jessica Hawkins states pt has healed, with two purple spots on the tip and hard skin. I offered an appt but Jessica Hawkins states Medicaid won't pay. Jessica Hawkins states she is soaking pt's foot and when she wears the pad Dr. Leeanne Deeduchman gave her she doesn't get much comfort. I told Jessica Hawkins I could get pt a sleeve that would fit over the toe and may give comfort, but if pt did not get relief or if had redness, increased pain, swelling or drainage, she should get pt an appt. Jessica Hawkins states she will pickup the sleeve on Monday.

## 2016-04-26 ENCOUNTER — Other Ambulatory Visit: Payer: Medicare Other

## 2016-04-26 DIAGNOSIS — D649 Anemia, unspecified: Secondary | ICD-10-CM | POA: Diagnosis not present

## 2016-04-26 LAB — CBC WITH DIFFERENTIAL/PLATELET
BASOS ABS: 0 {cells}/uL (ref 0–200)
Basophils Relative: 0 %
EOS PCT: 1 %
Eosinophils Absolute: 54 cells/uL (ref 15–500)
HEMATOCRIT: 28.6 % — AB (ref 35.0–45.0)
HEMOGLOBIN: 9 g/dL — AB (ref 11.7–15.5)
LYMPHS ABS: 1242 {cells}/uL (ref 850–3900)
Lymphocytes Relative: 23 %
MCH: 25.1 pg — ABNORMAL LOW (ref 27.0–33.0)
MCHC: 31.5 g/dL — ABNORMAL LOW (ref 32.0–36.0)
MCV: 79.9 fL — ABNORMAL LOW (ref 80.0–100.0)
MONO ABS: 432 {cells}/uL (ref 200–950)
MPV: 9.6 fL (ref 7.5–12.5)
Monocytes Relative: 8 %
NEUTROS ABS: 3672 {cells}/uL (ref 1500–7800)
NEUTROS PCT: 68 %
Platelets: 324 10*3/uL (ref 140–400)
RBC: 3.58 MIL/uL — AB (ref 3.80–5.10)
RDW: 15.3 % — ABNORMAL HIGH (ref 11.0–15.0)
WBC: 5.4 10*3/uL (ref 3.8–10.8)

## 2016-04-26 LAB — RETICULOCYTES
ABS Retic: 53700 {cells}/uL (ref 20000–80000)
RBC.: 3.58 MIL/uL — ABNORMAL LOW (ref 3.80–5.10)
Retic Ct Pct: 1.5 %

## 2016-04-26 LAB — IRON AND TIBC
%SAT: 4 % — ABNORMAL LOW (ref 11–50)
Iron: 10 ug/dL — ABNORMAL LOW (ref 45–160)
TIBC: 235 ug/dL — ABNORMAL LOW (ref 250–450)
UIBC: 225 ug/dL (ref 125–400)

## 2016-04-27 LAB — FOLATE

## 2016-04-27 LAB — VITAMIN B12: VITAMIN B 12: 937 pg/mL (ref 200–1100)

## 2016-04-30 ENCOUNTER — Encounter: Payer: Self-pay | Admitting: Podiatry

## 2016-04-30 ENCOUNTER — Other Ambulatory Visit: Payer: Self-pay | Admitting: Internal Medicine

## 2016-04-30 DIAGNOSIS — D649 Anemia, unspecified: Secondary | ICD-10-CM

## 2016-05-01 ENCOUNTER — Other Ambulatory Visit: Payer: Self-pay | Admitting: *Deleted

## 2016-05-01 MED ORDER — GLUCOSE BLOOD VI STRP: ORAL_STRIP | 12 refills | Status: AC

## 2016-05-01 MED ORDER — ONETOUCH ULTRASOFT LANCETS MISC: 12 refills | Status: DC

## 2016-05-01 NOTE — Telephone Encounter (Signed)
Caregiver, Marylene Landngela requested to be sent to [facility].

## 2016-05-06 ENCOUNTER — Telehealth: Payer: Self-pay | Admitting: *Deleted

## 2016-05-06 NOTE — Telephone Encounter (Signed)
Anytime she is sitting is a good time to elevate the legs. Try to use the compression stockings if ths is not being done at present.

## 2016-05-06 NOTE — Telephone Encounter (Signed)
Patient daughter, Flora Lippsamela Banks called and stated that patient has edema and wants to know what are your guidelines for patient elevating her legs. How often and How long should she do it. Please Advise.

## 2016-05-06 NOTE — Telephone Encounter (Signed)
Caregiver notified and stated that patient usually is sitting 4 hours a day. Informed her that whenever patient is sitting to have her legs elevated per Dr. Chilton SiGreen.

## 2016-05-07 ENCOUNTER — Encounter: Payer: Self-pay | Admitting: Internal Medicine

## 2016-05-09 ENCOUNTER — Other Ambulatory Visit: Payer: Medicare Other

## 2016-05-09 ENCOUNTER — Other Ambulatory Visit: Payer: Self-pay

## 2016-05-09 ENCOUNTER — Ambulatory Visit (HOSPITAL_COMMUNITY)
Admission: EM | Admit: 2016-05-09 | Discharge: 2016-05-09 | Disposition: A | Discharge status: Home / Self Care | Payer: Medicare Other | Attending: Family Medicine | Admitting: Family Medicine

## 2016-05-09 ENCOUNTER — Encounter (HOSPITAL_COMMUNITY): Payer: Self-pay | Admitting: Emergency Medicine

## 2016-05-09 ENCOUNTER — Telehealth: Payer: Self-pay | Admitting: *Deleted

## 2016-05-09 DIAGNOSIS — D649 Anemia, unspecified: Secondary | ICD-10-CM | POA: Diagnosis not present

## 2016-05-09 DIAGNOSIS — M25511 Pain in right shoulder: Secondary | ICD-10-CM

## 2016-05-09 DIAGNOSIS — M7551 Bursitis of right shoulder: Secondary | ICD-10-CM

## 2016-05-09 LAB — CBC WITH DIFFERENTIAL/PLATELET
BASOS PCT: 0 %
Basophils Absolute: 0 cells/uL (ref 0–200)
Eosinophils Absolute: 0 cells/uL — ABNORMAL LOW (ref 15–500)
Eosinophils Relative: 0 %
HCT: 29.6 % — ABNORMAL LOW (ref 35.0–45.0)
Hemoglobin: 9.1 g/dL — ABNORMAL LOW (ref 11.7–15.5)
LYMPHS PCT: 24 %
Lymphs Abs: 1296 cells/uL (ref 850–3900)
MCH: 24.1 pg — ABNORMAL LOW (ref 27.0–33.0)
MCHC: 30.7 g/dL — ABNORMAL LOW (ref 32.0–36.0)
MCV: 78.5 fL — ABNORMAL LOW (ref 80.0–100.0)
MONOS PCT: 10 %
MPV: 9.8 fL (ref 7.5–12.5)
Monocytes Absolute: 540 cells/uL (ref 200–950)
Neutro Abs: 3564 cells/uL (ref 1500–7800)
Neutrophils Relative %: 66 %
PLATELETS: 321 10*3/uL (ref 140–400)
RBC: 3.77 MIL/uL — AB (ref 3.80–5.10)
RDW: 15.3 % — AB (ref 11.0–15.0)
WBC: 5.4 10*3/uL (ref 3.8–10.8)

## 2016-05-09 MED ORDER — TRIAMCINOLONE ACETONIDE 40 MG/ML IJ SUSP
INTRAMUSCULAR | Status: AC
  Filled 2016-05-09: qty 1

## 2016-05-09 MED ORDER — DICLOFENAC SODIUM 3 % TD GEL: 1.0000 "application " | Freq: Two times a day (BID) | TRANSDERMAL | 0 refills | Status: DC

## 2016-05-09 NOTE — Telephone Encounter (Signed)
Patient and daughter, Jessica Hawkins walked into office to have labs. Patient wanted to be seen for shoulder pain due to bursitis. Checked schedule and no availability. Providers at lunch. I Offered patient next available appointment and refused, wants to be seen now. I instructed her to go to Urgent Care and she stated that she would just use the Salonpas patches her son got her. Daughter wanted patient to go to Urgent Care but patient refused.

## 2016-05-09 NOTE — Telephone Encounter (Signed)
Noted. No additional suggestions at this time.

## 2016-05-09 NOTE — ED Triage Notes (Signed)
Pt states she has been having pain in her right shoulder for the last two days.  Her caretaker states she has actually had pain for the last four days.  Pt states this is a recurrent issue with her shoulder, but she is usually treated by her PCP, but he was unable to see her today.

## 2016-05-15 ENCOUNTER — Ambulatory Visit (INDEPENDENT_AMBULATORY_CARE_PROVIDER_SITE_OTHER): Payer: Medicare Other | Admitting: Podiatry

## 2016-05-15 ENCOUNTER — Ambulatory Visit: Payer: Medicare Other | Admitting: Podiatry

## 2016-05-15 DIAGNOSIS — B351 Tinea unguium: Secondary | ICD-10-CM

## 2016-05-15 DIAGNOSIS — L84 Corns and callosities: Secondary | ICD-10-CM

## 2016-05-15 DIAGNOSIS — T148XXA Other injury of unspecified body region, initial encounter: Secondary | ICD-10-CM

## 2016-05-15 DIAGNOSIS — E1151 Type 2 diabetes mellitus with diabetic peripheral angiopathy without gangrene: Secondary | ICD-10-CM

## 2016-05-15 DIAGNOSIS — M79676 Pain in unspecified toe(s): Secondary | ICD-10-CM

## 2016-05-15 NOTE — Patient Instructions (Signed)

## 2016-05-15 NOTE — Progress Notes (Signed)
Patient ID: Jessica Hawkins, female   DOB: [birth year], 83 y.o.   MRN: [id number]   Subjective: This patient presents again today complaining of thick and long toenails are strong comfortable walking wearing shoes and requests nail debridement. The patient is complaining of discomfort in the distal third right toe. She describes some temporary relief when the corn is debrided, however, 1-2 weeks after debridement she complains of pain in the distal third right toe. Patient's daughters who is present in the treatment room describes picking at this corn with her fingernails to reduce the bulk. They would like to know if there is another option to reduce the discomfort in this area   Objective: Pitting edema bilaterally Palpable pedal pulses bilaterally Sensation to 10 g monofilament wire intact 3/5 bilaterally Vibratory sensation reactive bilaterally Ankle reflexes reactive bilaterally HAV bilaterally Hammertoe 2-4 bilaterally The toenails are extremely elongated, brittle, deformed, discolored and tender to palpation 6-10 No open skin lesions bilaterally Dorsi flexion, plantar flexion 5/5 right and 5/5 left Corn third right toe with dried blood within the corn. There is no active drainage from the corn on the distal third right toe.  Assessment: Type II diabetic with a history of peripheral arterial disease and peripheral neuropathy Hammertoe third left with pre-ulcerative corn distal third left toe Symptomatic onychomycoses 6-10  Plan: Debridement toenails 6-10 mechanically and electrically without any bleeding Debrided distal keratoses third right toe without any bleeding Dispensed gel toe For third right toe  Reappoint 3 months

## 2016-05-21 ENCOUNTER — Telehealth: Payer: Self-pay | Admitting: *Deleted

## 2016-05-21 DIAGNOSIS — Z1231 Encounter for screening mammogram for malignant neoplasm of breast: Secondary | ICD-10-CM

## 2016-05-21 NOTE — Telephone Encounter (Signed)
Refer for mammogram as requested. Routine screening.

## 2016-05-21 NOTE — Telephone Encounter (Signed)
Patient daughter, Elita Quickam called and requested a referral for a Mammogram for patient to be scheduled. Stated that she is over due for one and wants an appointment. Please Advise.

## 2016-05-22 NOTE — Telephone Encounter (Signed)
Order placed

## 2016-05-27 ENCOUNTER — Other Ambulatory Visit: Payer: Self-pay | Admitting: Internal Medicine

## 2016-05-27 DIAGNOSIS — Z1231 Encounter for screening mammogram for malignant neoplasm of breast: Secondary | ICD-10-CM

## 2016-05-28 ENCOUNTER — Ambulatory Visit (INDEPENDENT_AMBULATORY_CARE_PROVIDER_SITE_OTHER): Payer: Medicare Other | Admitting: Internal Medicine

## 2016-05-28 ENCOUNTER — Encounter: Payer: Self-pay | Admitting: Internal Medicine

## 2016-05-28 VITALS — BP 160/68 | HR 82 | Temp 97.3°F | Ht 63.0 in | Wt 137.6 lb

## 2016-05-28 DIAGNOSIS — I10 Essential (primary) hypertension: Secondary | ICD-10-CM

## 2016-05-28 DIAGNOSIS — N183 Chronic kidney disease, stage 3 unspecified: Secondary | ICD-10-CM

## 2016-05-28 DIAGNOSIS — R413 Other amnesia: Secondary | ICD-10-CM | POA: Diagnosis not present

## 2016-05-28 DIAGNOSIS — M25569 Pain in unspecified knee: Secondary | ICD-10-CM | POA: Diagnosis not present

## 2016-05-28 DIAGNOSIS — I872 Venous insufficiency (chronic) (peripheral): Secondary | ICD-10-CM

## 2016-05-28 DIAGNOSIS — D649 Anemia, unspecified: Secondary | ICD-10-CM

## 2016-05-28 NOTE — Progress Notes (Signed)
Facility  [facility] [facility]    Place of Service:   OFFICE    No Known Allergies  Chief Complaint  Patient presents with  . Medical Management of Chronic Issues    Follow-up     HPI:  Anemia, unspecified type - persistent microcytic anemia with low iron stores documented on 04/26/16. She has been taking her iron supplement regularly, but there is little improvement in Hgb. Colonoscopy in 2003 was unremarkable except for hemorrhoids.  No recent EGD, but she is without nausea or upper abd distress. Hx of GERD.  She would like to have surgery on her knee, but I think the issue of anemia needs clarification and resolution first.   Essential hypertension - controlled  CKD (chronic kidney disease) stage 3, GFR 30-59 ml/min - stable  Arthralgia of lower leg, unspecified laterality - persistent pain in the right knee  Venous insufficiency (chronic) (peripheral) - chronic edema of the lower legs that has improved with compression stockings  Memory change - patient and family are aware that her memory is declining slowly. They want to know if there are medications or treatments that will help.    Medications: Patient's Medications  New Prescriptions   No medications on file  Previous Medications   ASPIRIN EC 81 MG TABLET    Take 81 mg by mouth daily.   DICLOFENAC SODIUM 3 % GEL    Place 1 application onto the skin 2 (two) times daily.   EMOLLIENT (UDDERLY SMOOTH) CREA    Apply 1 application topically 2 (two) times daily as needed (for dry skin related to healing cellulitis).   FERROUS SULFATE 325 (65 FE) MG TABLET    Take 1 tablet (325 mg total) by mouth 3 (three) times daily with meals.   FOLIC ACID (FOLVITE) 1 MG TABLET    Take 1 tablet (1 mg total) by mouth daily.   FUROSEMIDE (LASIX) 40 MG TABLET    Take one tablet by mouth once daily for edema   GLUCOSE BLOOD TEST STRIP    One Touch Verio Test Strips. Use to test blood sugar once daily. Dx:E11.9   LANCETS (ONETOUCH ULTRASOFT) LANCETS     Use to test blood sugar once daily. Dx: E11.9   LEVOTHYROXINE (SYNTHROID, LEVOTHROID) 25 MCG TABLET    Take one tablet by mouth once daily for thyroid supplement   MULTIPLE VITAMIN (MULTIVITAMIN WITH MINERALS) TABS TABLET    Take 1 tablet by mouth daily. Centrum  Modified Medications   No medications on file  Discontinued Medications   No medications on file    Review of Systems  Constitutional: Positive for activity change. Negative for appetite change, chills, fatigue, fever and unexpected weight change.  Respiratory: Negative for cough, shortness of breath and wheezing.   Cardiovascular: Positive for leg swelling. Negative for chest pain and palpitations.  Gastrointestinal: Negative for abdominal distention, abdominal pain, constipation, diarrhea and vomiting.       Incontinent of stool and urine sometimes  Endocrine:       Diabetic  Genitourinary:       Hx of yeast infections  Musculoskeletal: Positive for arthralgias (Right knee and right shoulder), gait problem and myalgias.       Right shoulder pain. Right knee pain and weakness.  Skin: Negative.   Neurological: Positive for weakness (Left leg) and numbness (Left foot).       Left foot drop. Mild memory deficit.  Psychiatric/Behavioral: Positive for confusion (answers must be repeated seveeral times for her.).  Vitals:   05/28/16 1559  BP: (!) 160/68  Pulse: 82  Temp: 97.3 F (36.3 C)  TempSrc: Oral  SpO2: 98%  Weight: 137 lb 9.6 oz (62.4 kg)  Height: '5\' 3"'  (1.6 m)   Body mass index is 24.37 kg/m. Wt Readings from Last 3 Encounters:  05/28/16 137 lb 9.6 oz (62.4 kg)  04/08/16 145 lb 6.4 oz (66 kg)  03/19/16 146 lb (66.2 kg)      Physical Exam  Constitutional: She is oriented to person, place, and time. She appears well-developed and well-nourished. No distress.  frail  HENT:  Head: Normocephalic and atraumatic.  Eyes: Conjunctivae and EOM are normal. Pupils are equal, round, and reactive to light.    Neck: Normal range of motion. Neck supple. No JVD present.  Cardiovascular: Normal rate, regular rhythm, normal heart sounds and intact distal pulses.   Varicose veins.  Pulmonary/Chest: Effort normal and breath sounds normal. No respiratory distress. She has no wheezes. She has no rales.  Abdominal: Soft. Bowel sounds are normal.  Musculoskeletal: She exhibits edema (1+ right and 2+ to left) and tenderness (to left leg and calf).  Left foot drop. Significant weakness with attempts at dorsiflexion. Negative homans sign.  Lymphadenopathy:       Left: Inguinal adenopathy present.  Neurological: She is alert and oriented to person, place, and time. Coordination normal.  Decreased sensation left foot. Left foot drop and weakness with dorsiflexion. Memory deficit. 03/19/16 MMSE 26/30. Passed clock drawing.  Skin:  erythema of the left leg in a 3 x 4 inch patch at the lower left leg anterolaterally. This area is moderately tender.  Psychiatric: She has a normal mood and affect. Her behavior is normal.    Labs reviewed: Lab Summary Latest Ref Rng & Units 05/09/2016 04/26/2016 04/08/2016 03/26/2016 03/19/2016 02/29/2016 02/08/2016  Hemoglobin 11.7 - 15.5 g/dL 9.1(L) 9.0(L) 8.3(L) 8.9(L) 8.5(L) 8.9(L) 7.6(L)  Hematocrit 35.0 - 45.0 % 29.6(L) 28.6(L) 26.5(L) 28.4(L) 26.9(L) 28.5(L) 23.8(L)  White count 3.8 - 10.8 K/uL 5.4 5.4 5.5 4.8 5.9 5.3 6.6  Platelet count 140 - 400 K/uL 321 324 256 259 310 241 324  Sodium 135 - 146 mmol/L (None) (None) (None) (None) (None) 138 (None)  Potassium 3.5 - 5.3 mmol/L (None) (None) (None) (None) (None) 4.3 (None)  Calcium 8.6 - 10.4 mg/dL (None) (None) (None) (None) (None) 9.1 (None)  Phosphorus - (None) (None) (None) (None) (None) (None) (None)  Creatinine 0.60 - 0.88 mg/dL (None) (None) (None) (None) (None) 1.20(H) (None)  AST - (None) (None) (None) (None) (None) (None) (None)  Alk Phos - (None) (None) (None) (None) (None) (None) (None)  Bilirubin - (None) (None)  (None) (None) (None) (None) (None)  Glucose 65 - 99 mg/dL (None) (None) (None) (None) (None) 119(H) (None)  Cholesterol - (None) (None) (None) (None) (None) (None) (None)  HDL cholesterol - (None) (None) (None) (None) (None) (None) (None)  Triglycerides - (None) (None) (None) (None) (None) (None) (None)  LDL Direct - (None) (None) (None) (None) (None) (None) (None)  LDL Calc - (None) (None) (None) (None) (None) (None) (None)  Total protein - (None) (None) (None) (None) (None) (None) (None)  Albumin - (None) (None) (None) (None) (None) (None) (None)  Some recent data might be hidden   Lab Results  Component Value Date   TSH 1.030 07/21/2015   TSH 0.755 12/02/2014   TSH 1.040 05/05/2014   Lab Results  Component Value Date   BUN 32 (H) 02/29/2016   BUN 22 (H) 02/07/2016  BUN 24 (H) 02/04/2016   Lab Results  Component Value Date   HGBA1C 5.9 (H) 12/20/2015   HGBA1C 6.0 (H) 07/21/2015   HGBA1C 6.1 (H) 05/19/2015    Assessment/Plan  1. Anemia, unspecified type Before subjecting this 82 year old to endoscopic procedures, I think Hematology consultation may be helpful. - Ambulatory referral to Hematology  2. Essential hypertension controlled  3. CKD (chronic kidney disease) stage 3, GFR 30-59 ml/min stable  4. Arthralgia of lower leg, unspecified laterality Defer surgery on the right leg until anemia has improved  5. Venous insufficiency (chronic) (peripheral) Continue leg elevation 45 - 60 min twice daily. Wear compression stackings when out of bed  6. Memory deficit I would consider the use of donepezil once the anemia issues are improved. I would hate to add another medication at this point.

## 2016-06-10 ENCOUNTER — Encounter: Payer: Self-pay | Admitting: Oncology

## 2016-06-10 ENCOUNTER — Ambulatory Visit (HOSPITAL_BASED_OUTPATIENT_CLINIC_OR_DEPARTMENT_OTHER): Payer: Medicare Other | Admitting: Oncology

## 2016-06-10 ENCOUNTER — Telehealth: Payer: Self-pay | Admitting: Oncology

## 2016-06-10 ENCOUNTER — Ambulatory Visit (HOSPITAL_BASED_OUTPATIENT_CLINIC_OR_DEPARTMENT_OTHER): Payer: Medicare Other

## 2016-06-10 DIAGNOSIS — N189 Chronic kidney disease, unspecified: Secondary | ICD-10-CM | POA: Diagnosis not present

## 2016-06-10 DIAGNOSIS — D649 Anemia, unspecified: Secondary | ICD-10-CM

## 2016-06-10 DIAGNOSIS — D509 Iron deficiency anemia, unspecified: Secondary | ICD-10-CM

## 2016-06-10 DIAGNOSIS — M1990 Unspecified osteoarthritis, unspecified site: Secondary | ICD-10-CM | POA: Diagnosis not present

## 2016-06-10 DIAGNOSIS — R413 Other amnesia: Secondary | ICD-10-CM

## 2016-06-10 DIAGNOSIS — E119 Type 2 diabetes mellitus without complications: Secondary | ICD-10-CM | POA: Diagnosis not present

## 2016-06-10 LAB — COMPREHENSIVE METABOLIC PANEL
ALBUMIN: 3.1 g/dL — AB (ref 3.5–5.0)
ALK PHOS: 102 U/L (ref 40–150)
ALT: 13 U/L (ref 0–55)
ANION GAP: 11 meq/L (ref 3–11)
AST: 24 U/L (ref 5–34)
BILIRUBIN TOTAL: 0.39 mg/dL (ref 0.20–1.20)
BUN: 38.4 mg/dL — ABNORMAL HIGH (ref 7.0–26.0)
CO2: 27 mEq/L (ref 22–29)
Calcium: 9.6 mg/dL (ref 8.4–10.4)
Chloride: 100 mEq/L (ref 98–109)
Creatinine: 0.9 mg/dL (ref 0.6–1.1)
EGFR: 63 mL/min/{1.73_m2} — AB (ref >90)
GLUCOSE: 95 mg/dL (ref 70–140)
Potassium: 4.8 mEq/L (ref 3.5–5.1)
SODIUM: 138 meq/L (ref 136–145)
TOTAL PROTEIN: 8.5 g/dL — AB (ref 6.4–8.3)

## 2016-06-10 LAB — CBC WITH DIFFERENTIAL/PLATELET
BASO%: 0.5 % (ref 0.0–2.0)
BASOS ABS: 0 10*3/uL (ref 0.0–0.1)
EOS%: 1.4 % (ref 0.0–7.0)
Eosinophils Absolute: 0.1 10*3/uL (ref 0.0–0.5)
HCT: 29.4 % — ABNORMAL LOW (ref 34.8–46.6)
HGB: 9.1 g/dL — ABNORMAL LOW (ref 11.6–15.9)
LYMPH#: 1.6 10*3/uL (ref 0.9–3.3)
LYMPH%: 36.6 % (ref 14.0–49.7)
MCH: 24 pg — ABNORMAL LOW (ref 25.1–34.0)
MCHC: 31 g/dL — AB (ref 31.5–36.0)
MCV: 77.6 fL — ABNORMAL LOW (ref 79.5–101.0)
MONO#: 0.4 10*3/uL (ref 0.1–0.9)
MONO%: 9 % (ref 0.0–14.0)
NEUT#: 2.3 10*3/uL (ref 1.5–6.5)
NEUT%: 52.5 % (ref 38.4–76.8)
Platelets: 249 10*3/uL (ref 145–400)
RBC: 3.79 10*6/uL (ref 3.70–5.45)
RDW: 16.2 % — ABNORMAL HIGH (ref 11.2–14.5)
WBC: 4.4 10*3/uL (ref 3.9–10.3)

## 2016-06-10 LAB — IRON AND TIBC
%SAT: 7 % — ABNORMAL LOW (ref 21–57)
Iron: 19 ug/dL — ABNORMAL LOW (ref 41–142)
TIBC: 260 ug/dL (ref 236–444)
UIBC: 242 ug/dL (ref 120–384)

## 2016-06-10 LAB — MORPHOLOGY: PLT EST: ADEQUATE

## 2016-06-10 LAB — FERRITIN: Ferritin: 239 ng/ml (ref 9–269)

## 2016-06-10 LAB — TSH: TSH: 0.621 m(IU)/L (ref 0.308–3.960)

## 2016-06-10 NOTE — Progress Notes (Signed)
[facility] Initial Visit:  Patient Care Team: Estill Dooms, MD as PCP - General (Internal Medicine) Clarene Essex, MD as Consulting Physician (Gastroenterology) Franchot Gallo, MD as Consulting Physician (Urology) Roseanne Kaufman, MD as Consulting Physician (Orthopedic Surgery) Lavonna Monarch, MD as Consulting Physician (Dermatology) Marchia Bond, MD as Consulting Physician (Orthopedic Surgery)  CHIEF COMPLAINTS/PURPOSE OF CONSULTATION: For evaluation of anemia.    HISTORY OF PRESENTING ILLNESS: Jessica Hawkins 83 y.o. female is here for evaluation of anemia. Patient was recently seen by her orthopedic surgeon for possible right knee replacement. However found to have anemia. Patient has been treated with the iron supplements by her primary care provider. However her hemoglobin failed to improve. Hence she was advised to have an hematology evaluation.  Patient has other medical problems including hypothyroidism, chronic osteoarthritis and gastroesophageal reflux disease.  Patient  had a colonoscopy in 2003 which was unremarkable. Her family also states, she had upper endoscopy a year ago,  was also reported to be unremarkable.  I have reviewed all her medical available medical records, obtained history from the patient and all the family members who were accompanying her today in the clinic.  Patient has long-standing history of chronic anemia however going back to her medical records, approximately year ago her hemoglobin was at 12 grams.  However during the interim her hemoglobin has fluctuated. Her lowest hemoglobin was documented at 7.6 g on 02/08/2016.  Her latest hemoglobin on 05/09/16 was at 9.1 g, MCV 78.5 with RDW of 15.3.  Her iron panel was performed on her 04/26/2016 has revealed serum iron at 10 g/DL, TIBC 235 g/DL and saturation was  at the 4%. However no ferritin evaluation was performed.  Patient was also diagnosed with the cellulitis of the right  lower extremity, for which she has been treated with IV antibiotics.  Patient has been minimally ambulatory only with the walker. Patient continued to have discomfort and swelling involving the right knee joint. Hence patient was advised to have knee replacement. However due to her low hemoglobin levels her surgery has been postponed, and she was advised to have a hematology evaluation to assist in further management.  Patient denies any recent weight loss, fever or chills. Patient states her appetite is as usual. She has no spontaneous bruises or ecchymosis. However she complains having occasional nosebleeds. Patient denies bright red blood per rectum, blood in her urine or abnormal uterine bleeding. However her stool is dark due to iron intake.  Currently she is on iron supplements, ferrous sulfate 325 mg 3 times daily.  Review of Systems  Constitutional: Negative for appetite change, chills, diaphoresis, fatigue and unexpected weight change.  HENT:   Negative for hearing loss, lump/mass, mouth sores, nosebleeds (Complaints of occasional nosebleeds.), sore throat, tinnitus, trouble swallowing and voice change.   Eyes: Negative for eye problems and icterus.  Respiratory: Negative for chest tightness, cough, hemoptysis, shortness of breath and wheezing.   Cardiovascular: Positive for leg swelling (Patient has bilateral lower extremity swelling which has been chronic). Negative for chest pain and palpitations.  Gastrointestinal: Negative for abdominal distention, abdominal pain, blood in stool, constipation, diarrhea, nausea, rectal pain and vomiting.  Genitourinary: Negative for bladder incontinence, difficulty urinating, frequency and hematuria.   Musculoskeletal: Positive for arthralgias (Patient has chronic her right knee swelling and discomfort). Negative for back pain, flank pain, gait problem, neck pain and neck stiffness.  Skin: Negative for itching, rash and wound.  Neurological: Negative  for dizziness, extremity weakness,  gait problem, headaches, light-headedness, numbness, seizures and speech difficulty.  Hematological: Negative for adenopathy. Does not bruise/bleed easily.  Psychiatric/Behavioral: Positive for decreased concentration (Her family states she has short-term memory impairment). Negative for confusion, depression, sleep disturbance and suicidal ideas. The patient is not nervous/anxious.     MEDICAL HISTORY: Past Medical History:  Diagnosis Date  . Acute blood loss anemia   . Anginal pain [facility])    'i haven't had them in a long time but i used to'  . Arthritis    Osteoarthritis  . BBB (bundle branch block)    RT  . Coronary atherosclerosis of unspecified type of vessel, native or graft   . Debility, unspecified   . Diabetes mellitus without complication ([place])    Diet controlled  . Diaphragmatic hernia without mention of obstruction or gangrene   . Disturbance of skin sensation   . Edema   . Fibrocystic breast   . Gait disorder   . GERD (gastroesophageal reflux disease) 06/29/2011  . Hiatal hernia   . History of transfusion of packed red blood cells   . Hyperlipidemia   . Hypertension 06/29/11  . Hypothyroid 06/29/2011  . Insomnia   . OAB (overactive bladder)   . Osteoporosis   . Platelet disorder ([place])    ? Von Willebrand; abnormal PLT function assay 07/04/11   . Unspecified constipation   . Unspecified sleep apnea   . Unspecified urinary incontinence   . Unspecified vitamin D deficiency   . Unsteady gait   . Vertigo     SURGICAL HISTORY: Past Surgical History:  Procedure Laterality Date  . EYE SURGERY Right 2005   Cataract surgery  . EYE SURGERY Left 2006   Cataract surgery  . FEMUR IM NAIL  06/29/2011   Procedure: INTRAMEDULLARY (IM) NAIL FEMORAL;  Surgeon: Augustin Schooling, MD;  Location: [facility];  Service: Orthopedics;  Laterality: Left;  . HIP SURGERY Right 2002   ORIF  . ROTATOR CUFF REPAIR Left 1999   Tear  . THYROIDECTOMY  1952  .  TONSILLECTOMY    . TOTAL KNEE ARTHROPLASTY Left 05/29/2015   Procedure: TOTAL KNEE ARTHROPLASTY;  Surgeon: Vickey Huger, MD;  Location: [facility];  Service: Orthopedics;  Laterality: Left;    SOCIAL HISTORY: Social History   Social History  . Marital status: Widowed    Spouse name: N/A  . Number of children: N/A  . Years of education: N/A   Occupational History  . Not on file.   Social History Main Topics  . Smoking status: Never Smoker  . Smokeless tobacco: Never Used  . Alcohol use No  . Drug use: No  . Sexual activity: No   Other Topics Concern  . Not on file   Social History Narrative   Loredana Medellin [phone] Lemmie Evens                      [phone] C    FAMILY HISTORY Family History  Problem Relation Age of Onset  . Cancer Mother     Stomach  . Cancer Father     Prostate  . Emphysema Father   . Alcohol abuse Sister   . Liver disease Sister   . Kidney disease Brother   . Hypertension Daughter   . Cancer Brother     Prostate, Bladder  . Emphysema Brother   . Cancer Brother     Prostate  . Arthritis Daughter     Knees  . Heart murmur Son   .  Mental illness Son     Autism    ALLERGIES:  has No Known Allergies.  MEDICATIONS:  Current Outpatient Prescriptions  Medication Sig Dispense Refill  . aspirin EC 81 MG tablet Take 81 mg by mouth daily.    . Diclofenac Sodium 3 % GEL Place 1 application onto the skin 2 (two) times daily. (Patient not taking: Reported on 05/28/2016) 50 g 0  . Emollient (UDDERLY SMOOTH) CREA Apply 1 application topically 2 (two) times daily as needed (for dry skin related to healing cellulitis).    . ferrous sulfate 325 (65 FE) MG tablet Take 1 tablet (325 mg total) by mouth 3 (three) times daily with meals. 90 tablet 0  . folic acid (FOLVITE) 1 MG tablet Take 1 tablet (1 mg total) by mouth daily. 90 tablet 2  . furosemide (LASIX) 40 MG tablet Take one tablet by mouth once daily for edema 90 tablet 2  . glucose blood test strip One Touch  Verio Test Strips. Use to test blood sugar once daily. Dx:E11.9 100 each 12  . Lancets (ONETOUCH ULTRASOFT) lancets Use to test blood sugar once daily. Dx: E11.9 100 each 12  . levothyroxine (SYNTHROID, LEVOTHROID) 25 MCG tablet Take one tablet by mouth once daily for thyroid supplement 90 tablet 1  . Multiple Vitamin (MULTIVITAMIN WITH MINERALS) TABS tablet Take 1 tablet by mouth daily. Centrum     No current facility-administered medications for this visit.     PHYSICAL EXAMINATION:  ECOG PERFORMANCE STATUS: 0 - Asymptomatic   Vitals:   06/10/16 1206  BP: (!) 159/54  Pulse: 73  Resp: 18  Temp: 97.6 F (36.4 C)    Filed Weights   06/10/16 1206  Weight: 138 lb (62.6 kg)     Physical Exam  Constitutional: She is oriented to person, place, and time and well-developed, well-nourished, and in no distress. No distress.  HENT:  Head: Normocephalic and atraumatic.  Right Ear: External ear normal.  Left Ear: External ear normal.  Mouth/Throat: No oropharyngeal exudate.  Eyes: Conjunctivae and EOM are normal. Pupils are equal, round, and reactive to light. Right eye exhibits no discharge. Left eye exhibits no discharge. No scleral icterus.  Neck: Normal range of motion. No JVD present. No tracheal deviation present. No thyromegaly present.  Cardiovascular: Normal rate, regular rhythm, normal heart sounds and intact distal pulses.  Exam reveals no gallop and no friction rub.   No murmur heard. Pulmonary/Chest: Breath sounds normal. No stridor. No respiratory distress. She has no wheezes. She has no rales. She exhibits no tenderness.  Abdominal: Soft. Bowel sounds are normal. She exhibits no distension and no mass. There is no tenderness. There is no rebound and no guarding.  Musculoskeletal: She exhibits edema (1+ bilateral lower extremity edema present. Significant swelling of the right knee joint. Range of movements are decreased in both knee joints). She exhibits no tenderness.   Lymphadenopathy:    She has no cervical adenopathy.  Neurological: She is alert and oriented to person, place, and time. No cranial nerve deficit.  Skin: Skin is warm and dry. No rash noted. She is not diaphoretic. No erythema. There is pallor.  Psychiatric: Mood, memory, affect and judgment normal.     LABORATORY DATA: I have personally reviewed the data as listed:  No visits with results within 1 Month(s) from this visit.  Latest known visit with results is:  Appointment on 05/09/2016  Component Date Value Ref Range Status  . WBC 05/09/2016 5.4  3.8 -  10.8 K/uL Final  . RBC 05/09/2016 3.77* 3.80 - 5.10 MIL/uL Final  . Hemoglobin 05/09/2016 9.1* 11.7 - 15.5 g/dL Final  . HCT 05/09/2016 29.6* 35.0 - 45.0 % Final  . MCV 05/09/2016 78.5* 80.0 - 100.0 fL Final  . MCH 05/09/2016 24.1* 27.0 - 33.0 pg Final  . MCHC 05/09/2016 30.7* 32.0 - 36.0 g/dL Final  . RDW 05/09/2016 15.3* 11.0 - 15.0 % Final  . Platelets 05/09/2016 321  140 - 400 K/uL Final  . MPV 05/09/2016 9.8  7.5 - 12.5 fL Final  . Neutro Abs 05/09/2016 3564  1,500 - 7,800 cells/uL Final  . Lymphs Abs 05/09/2016 1296  850 - 3,900 cells/uL Final  . Monocytes Absolute 05/09/2016 540  200 - 950 cells/uL Final  . Eosinophils Absolute 05/09/2016 0* 15 - 500 cells/uL Final  . Basophils Absolute 05/09/2016 0  0 - 200 cells/uL Final  . Neutrophils Relative % 05/09/2016 66  % Final  . Lymphocytes Relative 05/09/2016 24  % Final  . Monocytes Relative 05/09/2016 10  % Final  . Eosinophils Relative 05/09/2016 0  % Final  . Basophils Relative 05/09/2016 0  % Final  . Smear Review 05/09/2016 Criteria for review not met   Final   ASSESSMENT/PLAN  1. Chronic microcytic hypochromic anemia. 2. History of hypothyroidism. 3. Chronic osteoarthritis. 4. Short-term memory loss. 5. Type 2 diabetes mellitus under control. 6. Chronic renal insufficiency.  Mrs. Lacko a 83 year old very pleasant Afro-American woman with the above medical  problems has been diagnosed with anemia at least since March of 2017.   Her CBC has indicated hypochromic microcytic anemia with mildly elevated RDW. However her iron panel has revealed possible anemia of chronic disease. Patient also has a mild renal insufficiency.  Patient is unable to improve her i hemoglobin in spite of the iron supplements, ferrous sulfate 325 mg 3 times daily.  My physical examination did not reveal obvious inflammation except for  swelling of her knee joint. It is not clear whether patient has a inflammatory arthritis or rheumatoid arthritis.  I have discussed  with her family and patient regarding various etiological factors of anemia, potential causes and differential diagnosis at length.  At this time I have recommended repeating CBC with  morphology evaluation, TSH, rheumatoid factor, C-reactive protein, serum protein electrophoresis, erythropoietin level and hemoglobin electrophoresis.   I have made a follow-up appointment in 7 days in the outpatient clinic to review the all the test results. I would make further recommendations based on the about test results.  I have spent more than 90 minutes for this new consultation. More than 50% of the time has been spent face-to-face with patient and her family. I once all their questions to their satisfaction.  I once again thank her primary care provider and her orthopedic surgeon for giving me the opportunity to participate in the care of Ms. Hedgecock who  is a very pleasant woman.      Orders Placed This Encounter  Procedures  . CBC with Differential    Standing Status:   Future    Number of Occurrences:   1    Standing Expiration Date:   06/10/2017  . Morphology    Standing Status:   Future    Number of Occurrences:   1    Standing Expiration Date:   06/10/2017  . Comprehensive metabolic panel    Standing Status:   Future    Number of Occurrences:   1    Standing  Expiration Date:   06/10/2017  . Ferritin     Standing Status:   Future    Number of Occurrences:   1    Standing Expiration Date:   06/10/2017  . Iron and TIBC    Standing Status:   Future    Number of Occurrences:   1    Standing Expiration Date:   06/10/2017  . Beta 2 microglobulin    Standing Status:   Future    Number of Occurrences:   1    Standing Expiration Date:   06/10/2017  . SPEP (Serum protein electrophoresis)    Standing Status:   Future    Number of Occurrences:   1    Standing Expiration Date:   06/10/2017  . Multiple Myeloma Panel (SPEP&IFE w/QIG)    Standing Status:   Future    Number of Occurrences:   1    Standing Expiration Date:   06/10/2017  . Rheumatoid factor    Standing Status:   Future    Number of Occurrences:   1    Standing Expiration Date:   06/10/2017  . ANA, IFA (with reflex)    Standing Status:   Future    Number of Occurrences:   1    Standing Expiration Date:   06/10/2017  . Thyroid Panel With TSH    Standing Status:   Future    Number of Occurrences:   1    Standing Expiration Date:   06/10/2017  . C-reactive protein    Standing Status:   Future    Number of Occurrences:   1    Standing Expiration Date:   06/10/2017  . Erythropoietin    Standing Status:   Future    Number of Occurrences:   1    Standing Expiration Date:   06/10/2017  . Hemoglobinopathy evaluation    Standing Status:   Future    Number of Occurrences:   1    Standing Expiration Date:   06/10/2017    All questions were answered. The patient knows to call the clinic with any problems, questions or concerns.  This note was electronically signed.    Judge Stall, MD  06/10/2016 2:07 PM

## 2016-06-10 NOTE — Telephone Encounter (Signed)
Gave patient AVS and calender per 4/9 los. Per los - see patient in 7 days

## 2016-06-11 LAB — T4: T4 TOTAL: 6.7 ug/dL (ref 4.5–12.0)

## 2016-06-11 LAB — BETA 2 MICROGLOBULIN, SERUM: Beta-2: 4 mg/L — ABNORMAL HIGH (ref 0.6–2.4)

## 2016-06-11 LAB — RHEUMATOID FACTOR: RHEUMATOID FACTOR: 11.7 [IU]/mL (ref 0.0–13.9)

## 2016-06-11 LAB — T3 UPTAKE
FREE THYROXINE INDEX: 2.1 (ref 1.2–4.9)
T3 UPTAKE RATIO: 31 % (ref 24–39)

## 2016-06-11 LAB — C-REACTIVE PROTEIN: CRP: 23.1 mg/L — ABNORMAL HIGH (ref 0.0–4.9)

## 2016-06-11 LAB — T4, FREE: T4,Free(Direct): 1.23 ng/dL (ref 0.82–1.77)

## 2016-06-11 LAB — ERYTHROPOIETIN: Erythropoietin: 21.6 m[IU]/mL — ABNORMAL HIGH (ref 2.6–18.5)

## 2016-06-12 LAB — HEMOGLOBINOPATHY EVALUATION
HEMOGLOBIN F QUANTITATION: 0 % (ref 0.0–2.0)
HGB C: 0 %
HGB S: 0 %
HGB VARIANT: 0 %
Hemoglobin A2 Quantitation: 1.8 % (ref 1.8–3.2)
Hgb A: 98.2 % (ref 96.4–98.8)

## 2016-06-12 LAB — MULTIPLE MYELOMA PANEL, SERUM
ALBUMIN/GLOB SERPL: 0.7 (ref 0.7–1.7)
ALPHA 1: 0.3 g/dL (ref 0.0–0.4)
ALPHA2 GLOB SERPL ELPH-MCNC: 1 g/dL (ref 0.4–1.0)
Albumin SerPl Elph-Mcnc: 3.2 g/dL (ref 2.9–4.4)
B-Globulin SerPl Elph-Mcnc: 1.4 g/dL — ABNORMAL HIGH (ref 0.7–1.3)
GAMMA GLOB SERPL ELPH-MCNC: 2.1 g/dL — AB (ref 0.4–1.8)
GLOBULIN, TOTAL: 4.7 g/dL — AB (ref 2.2–3.9)
IGA/IMMUNOGLOBULIN A, SERUM: 750 mg/dL — AB (ref 64–422)
IgG, Qn, Serum: 1880 mg/dL — ABNORMAL HIGH (ref 700–1600)
IgM, Qn, Serum: 138 mg/dL (ref 26–217)
Total Protein: 7.9 g/dL (ref 6.0–8.5)

## 2016-06-12 LAB — ANTINUCLEAR ANTIBODIES, IFA: ANTINUCLEAR ANTIBODIES, IFA: NEGATIVE

## 2016-06-17 ENCOUNTER — Ambulatory Visit: Payer: Medicare Other

## 2016-06-18 ENCOUNTER — Ambulatory Visit (HOSPITAL_BASED_OUTPATIENT_CLINIC_OR_DEPARTMENT_OTHER): Payer: Medicare Other | Admitting: Oncology

## 2016-06-18 VITALS — BP 182/60 | HR 77 | Temp 97.9°F | Resp 18 | Ht 63.0 in | Wt 137.0 lb

## 2016-06-18 DIAGNOSIS — M25561 Pain in right knee: Secondary | ICD-10-CM

## 2016-06-18 DIAGNOSIS — M1990 Unspecified osteoarthritis, unspecified site: Secondary | ICD-10-CM

## 2016-06-18 DIAGNOSIS — R413 Other amnesia: Secondary | ICD-10-CM | POA: Diagnosis not present

## 2016-06-18 DIAGNOSIS — D509 Iron deficiency anemia, unspecified: Secondary | ICD-10-CM

## 2016-06-18 DIAGNOSIS — E119 Type 2 diabetes mellitus without complications: Secondary | ICD-10-CM | POA: Diagnosis not present

## 2016-06-18 DIAGNOSIS — N189 Chronic kidney disease, unspecified: Secondary | ICD-10-CM

## 2016-06-18 DIAGNOSIS — N181 Chronic kidney disease, stage 1: Secondary | ICD-10-CM

## 2016-06-18 DIAGNOSIS — D631 Anemia in chronic kidney disease: Secondary | ICD-10-CM | POA: Insufficient documentation

## 2016-06-18 DIAGNOSIS — D638 Anemia in other chronic diseases classified elsewhere: Secondary | ICD-10-CM | POA: Insufficient documentation

## 2016-06-19 ENCOUNTER — Ambulatory Visit (INDEPENDENT_AMBULATORY_CARE_PROVIDER_SITE_OTHER): Payer: Medicare Other | Admitting: Internal Medicine

## 2016-06-19 ENCOUNTER — Encounter: Payer: Self-pay | Admitting: Internal Medicine

## 2016-06-19 VITALS — BP 138/60 | HR 73 | Temp 97.8°F | Ht 63.0 in | Wt 137.0 lb

## 2016-06-19 DIAGNOSIS — D638 Anemia in other chronic diseases classified elsewhere: Secondary | ICD-10-CM | POA: Diagnosis not present

## 2016-06-19 DIAGNOSIS — M25511 Pain in right shoulder: Secondary | ICD-10-CM

## 2016-06-19 DIAGNOSIS — G8929 Other chronic pain: Secondary | ICD-10-CM

## 2016-06-19 DIAGNOSIS — M7541 Impingement syndrome of right shoulder: Secondary | ICD-10-CM

## 2016-06-19 DIAGNOSIS — N183 Chronic kidney disease, stage 3 unspecified: Secondary | ICD-10-CM

## 2016-06-19 DIAGNOSIS — M25569 Pain in unspecified knee: Secondary | ICD-10-CM

## 2016-06-19 DIAGNOSIS — I10 Essential (primary) hypertension: Secondary | ICD-10-CM | POA: Diagnosis not present

## 2016-06-19 DIAGNOSIS — E1151 Type 2 diabetes mellitus with diabetic peripheral angiopathy without gangrene: Secondary | ICD-10-CM | POA: Diagnosis not present

## 2016-06-19 MED ORDER — CELECOXIB 200 MG PO CAPS: 200.0000 mg | ORAL_CAPSULE | Freq: Two times a day (BID) | ORAL | 2 refills | Status: DC

## 2016-06-19 MED ORDER — LIDOCAINE 4 % EX CREA: TOPICAL_CREAM | CUTANEOUS | 5 refills | Status: AC

## 2016-06-19 NOTE — Progress Notes (Signed)
[facility] Follow up:    Jeanmarie Hubert, MD [phone] [address] [id number]  HISTORY OF PRESENTING ILLNESS:  Jessica Hawkins 83 y.o. female  was seen first time on 06/10/2016 for evaluation of chronic anemia.  Patient was recently seen by her orthopedic surgeon for possible right knee replacement. However found to have anemia. Patient has been treated with the iron supplements by her primary care provider. However her hemoglobin failed to improve. Hence she was advised to have an hematology evaluation.  Patient has other medical problems including hypothyroidism, chronic osteoarthritis and gastroesophageal reflux disease.  Patient  had a colonoscopy in 2003 which was unremarkable. Her family also states, she had upper endoscopy a year ago,  was also reported to be unremarkable.  I have reviewed all her medical available medical records, obtained history from the patient and all the family members who were accompanying her today in the clinic.  Patient has long-standing history of chronic anemia however going back to her medical records, approximately year ago her hemoglobin was at 12 grams.  However during the interim her hemoglobin has fluctuated. Her lowest hemoglobin was documented at 7.6 g on 02/08/2016.  Her latest hemoglobin on 05/09/16 was at 9.1 g, MCV 78.5 with RDW of 15.3.  Her iron panel was performed on her 04/26/2016 has revealed serum iron at 10 g/DL, TIBC 235 g/DL and saturation was  at the 4%. However no ferritin evaluation was performed.  Patient was also diagnosed with the cellulitis of the right lower extremity, for which she has been treated with IV antibiotics.  Patient has been minimally ambulatory only with the walker. Patient continued to have discomfort and swelling involving the right knee joint. Hence patient was advised to have knee replacement. However due to her low hemoglobin levels her surgery has been postponed, and she was  advised to have a hematology evaluation to assist in further management.  Patient denies any recent weight loss, fever or chills. Patient states her appetite is as usual. She has no spontaneous bruises or ecchymosis. However she complains having occasional nosebleeds. Patient denies bright red blood per rectum, blood in her urine or abnormal uterine bleeding. However her stool is dark due to iron intake.  Currently she is on iron supplements, ferrous sulfate 325 mg 3 times daily.   INTERVAL HISTORY: Jessica Hawkins 83 y.o. female returns for scheduled follow-up visit for evaluation of anemia.  I did perform several tests, hemoglobin electrophoresis, which did not reveal any presence of a abnormal hemoglobinopathies. Performed several tests including thyroid function tests, rheumatoid factor evaluation ANA. However those tests were unremarkable. Her iron panel has indicated low iron saturation, low TIBC with normal ferritin levels indicated to offer anemia of chronic disease.  Previously her V67 and folic acid levels were within normal range however reticulocytes counts were  below normal to the level of hemoglobin.  Her C-reactive protein has increased up to 23.1 mg/L. Workup for multiple myeloma has been negative. 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  At this time patient denies significant pain however her family members states she always has pain in her knee as well as right shoulder. Patient denies having any fever or chills. Previously patient was treated for cellulitis of the right leg.  At this  time patient is more concerned about her upcoming right knee surgery    Patient Active Problem List   Diagnosis Date Noted  .  Anemia of chronic disease 06/18/2016  . Anemia of chronic kidney failure 06/18/2016  . Exposure to influenza 03/19/2016  . Memory deficit 03/19/2016  . CKD (chronic kidney disease)  stage 3, GFR 30-59 ml/min 02/08/2016  . Cellulitis of left anterior lower leg 01/31/2016  . Anemia 12/20/2015  . LLQ abdominal pain 12/06/2015  . Self-care deficit in patient living alone 11/08/2015  . Peripheral nerve contusion 08/15/2015  . Foot drop, left 07/18/2015  . S/P total knee arthroplasty 05/29/2015  . Bleeding disorder ([place]) 05/22/2015  . Primary osteoarthritis of both knees 05/04/2015  . Nonspecific vaginitis 04/11/2015  . Recurrent UTI 03/07/2015  . Joint mouse 03/07/2015  . Right shoulder pain 01/18/2015  . Pre-operative cardiovascular examination 12/07/2014  . Loss of weight 10/13/2013  . Constipation 10/06/2013  . Venous insufficiency (chronic) (peripheral) 02/02/2013  . Knee pain 12/16/2012  . Arthritis of knee, degenerative 10/29/2012  . Edema 07/23/2012  . Abnormality of gait 07/23/2012  . Urinary incontinence 07/23/2012  . Hyperlipidemia 07/23/2012  . Controlled type 2 DM with peripheral circulatory disorder ([place]) 07/23/2012  . GERD (gastroesophageal reflux disease) 06/29/2011  . HTN (hypertension) 06/29/2011  . Intertrochanteric fracture of left femur ([place]) 06/29/2011  . Arthritis 06/29/2011  . Hypothyroid 06/29/2011    has No Known Allergies.  MEDICAL HISTORY: Past Medical History:  Diagnosis Date  . Acute blood loss anemia   . Anginal pain [facility])    'i haven't had them in a long time but i used to'  . Arthritis    Osteoarthritis  . BBB (bundle branch block)    RT  . Coronary atherosclerosis of unspecified type of vessel, native or graft   . Debility, unspecified   . Diabetes mellitus without complication ([place])    Diet controlled  . Diaphragmatic hernia without mention of obstruction or gangrene   . Disturbance of skin sensation   . Edema   . Fibrocystic breast   . Gait disorder   . GERD (gastroesophageal reflux disease) 06/29/2011  . Hiatal hernia   . History of transfusion of packed red blood cells   . Hyperlipidemia   . Hypertension  06/29/11  . Hypothyroid 06/29/2011  . Insomnia   . OAB (overactive bladder)   . Osteoporosis   . Platelet disorder ([place])    ? Von Willebrand; abnormal PLT function assay 07/04/11   . Unspecified constipation   . Unspecified sleep apnea   . Unspecified urinary incontinence   . Unspecified vitamin D deficiency   . Unsteady gait   . Vertigo     SURGICAL HISTORY: Past Surgical History:  Procedure Laterality Date  . EYE SURGERY Right 2005   Cataract surgery  . EYE SURGERY Left 2006   Cataract surgery  . FEMUR IM NAIL  06/29/2011   Procedure: INTRAMEDULLARY (IM) NAIL FEMORAL;  Surgeon: Augustin Schooling, MD;  Location: [place];  Service: Orthopedics;  Laterality: Left;  . HIP SURGERY Right 2002   ORIF  . ROTATOR CUFF REPAIR Left 1999   Tear  . THYROIDECTOMY  1952  . TONSILLECTOMY    . TOTAL KNEE ARTHROPLASTY Left 05/29/2015   Procedure: TOTAL KNEE ARTHROPLASTY;  Surgeon: Vickey Huger, MD;  Location: [place];  Service: Orthopedics;  Laterality: Left;    SOCIAL HISTORY: Social History   Social History  . Marital status: Widowed    Spouse name: N/A  . Number of children: N/A  . Years of education: N/A   Occupational History  . Not on file.  Social History Main Topics  . Smoking status: Never Smoker  . Smokeless tobacco: Never Used  . Alcohol use No  . Drug use: No  . Sexual activity: No   Other Topics Concern  . Not on file   Social History Narrative   Mikeila Burgen [phone] Lemmie Evens                      [phone] C    FAMILY HISTORY: Family History  Problem Relation Age of Onset  . Cancer Mother     Stomach  . Cancer Father     Prostate  . Emphysema Father   . Alcohol abuse Sister   . Liver disease Sister   . Kidney disease Brother   . Hypertension Daughter   . Cancer Brother     Prostate, Bladder  . Emphysema Brother   . Cancer Brother     Prostate  . Arthritis Daughter     Knees  . Heart murmur Son   . Mental illness Son     Autism    Review of Systems   Constitutional: Negative for appetite change, chills, diaphoresis, fatigue, fever and unexpected weight change.  HENT:   Negative for hearing loss, lump/mass, mouth sores, nosebleeds, sore throat, tinnitus, trouble swallowing and voice change.   Eyes: Negative for icterus.  Respiratory: Negative for chest tightness, cough, hemoptysis, shortness of breath and wheezing.   Cardiovascular: Negative for chest pain, leg swelling and palpitations.  Gastrointestinal: Negative for abdominal distention, abdominal pain, blood in stool, constipation, diarrhea, nausea, rectal pain and vomiting.  Genitourinary: Negative for bladder incontinence, difficulty urinating, dysuria, frequency and hematuria.   Musculoskeletal: Positive for arthralgias (Patient has bilateral knee pains as well as right shoulder pain which has been chronic.). Negative for back pain, flank pain, gait problem, myalgias, neck pain and neck stiffness.  Skin: Negative for itching and rash.  Neurological: Negative for dizziness, extremity weakness, gait problem, headaches, light-headedness, numbness, seizures and speech difficulty.  Hematological: Negative for adenopathy.  Psychiatric/Behavioral: Negative for confusion, decreased concentration and depression. The patient is not nervous/anxious.       PHYSICAL EXAMINATION  ECOG PERFORMANCE STATUS: 1 - Symptomatic but completely ambulatory  Vitals:   06/18/16 1633  BP: (!) 182/60  Pulse: 77  Resp: 18  Temp: 97.9 F (36.6 C)    Physical Exam  Constitutional: She is oriented to person, place, and time. No distress.  Currently developed, moderately nourished Afro-American woman, not in acute distress.  HENT:  Head: Normocephalic.  Right Ear: External ear normal.  Mouth/Throat: Oropharynx is clear and moist. No oropharyngeal exudate.  Eyes: Conjunctivae are normal. Pupils are equal, round, and reactive to light. Right eye exhibits no discharge. Left eye exhibits no discharge. No  scleral icterus.  Neck: Normal range of motion. Neck supple. No JVD present. No tracheal deviation present. No thyromegaly present.  Cardiovascular: Normal rate, regular rhythm and normal heart sounds.  Exam reveals no gallop and no friction rub.   No murmur heard. Pulmonary/Chest: Effort normal. No stridor. No respiratory distress. She has no wheezes. She has no rales. She exhibits no tenderness.  Abdominal: Soft. Bowel sounds are normal. She exhibits no distension and no mass. There is no tenderness. There is no rebound and no guarding.  Musculoskeletal: Normal range of motion. She exhibits no edema, tenderness or deformity.  Lymphadenopathy:    She has no cervical adenopathy.  Neurological: She is alert and oriented to person, place, and time. No  cranial nerve deficit.  Skin: Skin is warm. No rash noted. She is not diaphoretic. No erythema. No pallor.  Psychiatric: Mood, memory, affect and judgment normal.    LABORATORY DATA:  CBC    Component Value Date/Time   WBC 4.4 06/10/2016 1359   WBC 5.4 05/09/2016 1142   RBC 3.79 06/10/2016 1359   RBC 3.77 (L) 05/09/2016 1142   HGB 9.1 (L) 06/10/2016 1359   HCT 29.4 (L) 06/10/2016 1359   PLT 249 06/10/2016 1359   MCV 77.6 (L) 06/10/2016 1359   MCH 24.0 (L) 06/10/2016 1359   MCH 24.1 (L) 05/09/2016 1142   MCHC 31.0 (L) 06/10/2016 1359   MCHC 30.7 (L) 05/09/2016 1142   RDW 16.2 (H) 06/10/2016 1359   LYMPHSABS 1.6 06/10/2016 1359   MONOABS 0.4 06/10/2016 1359   EOSABS 0.1 06/10/2016 1359   BASOSABS 0.0 06/10/2016 1359    CMP     Component Value Date/Time   NA 138 06/10/2016 1359   K 4.8 06/10/2016 1359   CL 100 02/29/2016 1537   CO2 27 06/10/2016 1359   GLUCOSE 95 06/10/2016 1359   BUN 38.4 (H) 06/10/2016 1359   CREATININE 0.9 06/10/2016 1359   CALCIUM 9.6 06/10/2016 1359   PROT 7.9 06/10/2016 1400   PROT 8.5 (H) 06/10/2016 1359   ALBUMIN 3.1 (L) 06/10/2016 1359   AST 24 06/10/2016 1359   ALT 13 06/10/2016 1359   ALKPHOS  102 06/10/2016 1359   BILITOT 0.39 06/10/2016 1359   GFRNONAA 40 (L) 02/29/2016 1537   GFRAA 46 (L) 02/29/2016 1537           ASSESSMENT and THERAPY PLAN:    1. Anemia of chronic disease (microcytic hypochromic anemia). 2. History of hypothyroidism. 3. Chronic osteoarthritis. 4. Short-term memory loss. 5. Type 2 diabetes mellitus under control. 6. Chronic renal insufficiency ( resolved)   Ms. Nolon Rod owns a 83 year old Afro-American woman has been diagnosed with chronic anemia. Patient also has other above medical problems.  Currently patient is not symptomatic due to anemia. However continue to have pain involving the right knee. Patient has been scheduled for right knee replacement however has been held due to anemia.  At present time based on the all the laboratory results,  it seems patient has anemia of chronic disease , patient has been taking iron supplements by mouth however her hemoglobin failed to improve.   I have suggested  to the patient and family, we'll try a trial of a IV iron therapy.  I also assured the family members, based on the lab results, patient has no evidence of underlying myelodysplastic syndrome, hemoglobinopathies or multiple myeloma.  I have made arrangements for IV iron 200 mg once a week for 2 weeks. I have arranged a follow-up appointment with the hematologist Dr. Irene Limbo in our clinic in 2 weeks.  I have also communicated with her primary care provider Dr. Nyoka Cowden regarding the above plan.  Orders Placed This Encounter  Procedures  . CBC with Differential    Standing Status:   Future    Standing Expiration Date:   06/18/2017    All questions were answered. The patient knows to call the clinic with any problems, questions or concerns. We can certainly see the patient much sooner if necessary. This note was electronically signed. Judge Stall, MD 06/19/2016

## 2016-06-19 NOTE — Progress Notes (Signed)
Facility  [facility]    Place of Service:   OFFICE    No Known Allergies  Chief Complaint  Patient presents with  . Medical Management of Chronic Issues    medication management anemia. Here with daughter Olin Hauser, Care Adline Peals.  . Shoulder Pain    can't raise arm    HPI:  Anemia of chronic disease - Saw Dr. Thana Farr, Hematologist. She continues with low iron stores. He thinks she has anemia of chronic disease. Has scheduled her for 2 IV injections of iron. If ths moves the hgb over 10, then I will approve her for knee surgery. If not, she should be transfused PRBC. And then sent to the surgeon. She will need cardiology clearance prior to the surgery.  CKD (chronic kidney disease) stage 3, GFR 30-59 ml/min - stable  Controlled type 2 DM with peripheral circulatory disorder (HCC) - controlled  Essential hypertension  Arthralgia of lower leg, unspecified laterality - continnues with right knee poain  Chronic right shoulder pain - pain is worse than usual. Was seen in the ER 05/09/16, but no xrays were done and she was diagnosed with bursitis. Today's exam is much more consistent with impingement syndrome of the right shoulder   Medications: Patient's Medications  New Prescriptions   No medications on file  Previous Medications   ASPIRIN EC 81 MG TABLET    Take 81 mg by mouth daily.   EMOLLIENT (UDDERLY SMOOTH) CREA    Apply 1 application topically 2 (two) times daily as needed (for dry skin related to healing cellulitis).   FERROUS SULFATE 325 (65 FE) MG TABLET    Take 1 tablet (325 mg total) by mouth 3 (three) times daily with meals.   FOLIC ACID (FOLVITE) 1 MG TABLET    Take 1 tablet (1 mg total) by mouth daily.   FUROSEMIDE (LASIX) 40 MG TABLET    Take one tablet by mouth once daily for edema   GLUCOSE BLOOD TEST STRIP    One Touch Verio Test Strips. Use to test blood sugar once daily. Dx:E11.9   LANCETS (ONETOUCH ULTRASOFT) LANCETS    Use to test blood sugar once daily. Dx:  E11.9   LEVOTHYROXINE (SYNTHROID, LEVOTHROID) 25 MCG TABLET    Take one tablet by mouth once daily for thyroid supplement   MULTIPLE VITAMIN (MULTIVITAMIN WITH MINERALS) TABS TABLET    Take 1 tablet by mouth daily. Centrum  Modified Medications   No medications on file  Discontinued Medications   DICLOFENAC SODIUM 3 % GEL    Place 1 application onto the skin 2 (two) times daily.    Review of Systems  Constitutional: Positive for activity change. Negative for appetite change, chills, fatigue, fever and unexpected weight change.  Respiratory: Negative for cough, shortness of breath and wheezing.   Cardiovascular: Positive for leg swelling. Negative for chest pain and palpitations.  Gastrointestinal: Negative for abdominal distention, abdominal pain, constipation, diarrhea and vomiting.       Incontinent of stool and urine sometimes  Endocrine:       Diabetic  Genitourinary:       Hx of yeast infections  Musculoskeletal: Positive for arthralgias (Right knee and right shoulder), gait problem and myalgias.       Right shoulder pain with elevation and rotation. No pain at acromial bursa. Right knee pain and weakness.  Skin: Negative.   Neurological: Positive for weakness (Left leg) and numbness (Left foot).       Left foot  drop. Mild memory deficit.  Psychiatric/Behavioral: Positive for confusion (answers must be repeated seveeral times for her.).    Vitals:   06/19/16 1438  BP: 138/60  Pulse: 73  Temp: 97.8 F (36.6 C)  TempSrc: Oral  SpO2: 95%  Weight: 137 lb (62.1 kg)  Height: '5\' 3"'  (1.6 m)   Body mass index is 24.27 kg/m. Wt Readings from Last 3 Encounters:  06/19/16 137 lb (62.1 kg)  06/18/16 137 lb (62.1 kg)  06/10/16 138 lb (62.6 kg)      Physical Exam  Constitutional: She is oriented to person, place, and time. She appears well-developed and well-nourished. No distress.  frail  HENT:  Head: Normocephalic and atraumatic.  Eyes: Conjunctivae and EOM are normal.  Pupils are equal, round, and reactive to light.  Neck: Normal range of motion. Neck supple. No JVD present.  Cardiovascular: Normal rate, regular rhythm, normal heart sounds and intact distal pulses.   Varicose veins.  Pulmonary/Chest: Effort normal and breath sounds normal. No respiratory distress. She has no wheezes. She has no rales.  Abdominal: Soft. Bowel sounds are normal.  Musculoskeletal: She exhibits edema (1+ right and 2+ to left) and tenderness (to left leg and calf).  Left foot drop. Significant weakness with attempts at dorsiflexion.  Right shoulder pain with elevation and rotation. Not painful in supraspinatus area, or at the acromial bursa.  Lymphadenopathy:       Left: Inguinal adenopathy present.  Neurological: She is alert and oriented to person, place, and time. Coordination normal.  Decreased sensation left foot. Left foot drop and weakness with dorsiflexion. Memory deficit. 03/19/16 MMSE 26/30. Passed clock drawing.  Skin:  erythema of the left leg in a 3 x 4 inch patch at the lower left leg anterolaterally. This area is moderately tender.  Psychiatric: She has a normal mood and affect. Her behavior is normal.    Labs reviewed: Lab Summary Latest Ref Rng & Units 06/10/2016 05/09/2016 04/26/2016 04/08/2016 03/26/2016  Hemoglobin 11.6 - 15.9 g/dL 9.1(L) 9.1(L) 9.0(L) 8.3(L) 8.9(L)  Hematocrit 34.8 - 46.6 % 29.4(L) 29.6(L) 28.6(L) 26.5(L) 28.4(L)  White count 3.9 - 10.3 10e3/uL 4.4 5.4 5.4 5.5 4.8  Platelet count 145 - 400 10e3/uL 249 321 324 256 259  Sodium 136 - 145 mEq/L 138 (None) (None) (None) (None)  Potassium 3.5 - 5.1 mEq/L 4.8 (None) (None) (None) (None)  Calcium 8.4 - 10.4 mg/dL 9.6 (None) (None) (None) (None)  Phosphorus - (None) (None) (None) (None) (None)  Creatinine 0.6 - 1.1 mg/dL 0.9 (None) (None) (None) (None)  AST 5 - 34 U/L 24 (None) (None) (None) (None)  Alk Phos 40 - 150 U/L 102 (None) (None) (None) (None)  Bilirubin 0.20 - 1.20 mg/dL 0.39 (None) (None)  (None) (None)  Glucose 70 - 140 mg/dl 95 (None) (None) (None) (None)  Cholesterol - (None) (None) (None) (None) (None)  HDL cholesterol - (None) (None) (None) (None) (None)  Triglycerides - (None) (None) (None) (None) (None)  LDL Direct - (None) (None) (None) (None) (None)  LDL Calc - (None) (None) (None) (None) (None)  Total protein 6.4 - 8.3 g/dL 8.5(H) (None) (None) (None) (None)  Albumin 3.5 - 5.0 g/dL 3.1(L) (None) (None) (None) (None)  Some recent data might be hidden   Lab Results  Component Value Date   TSH 0.621 06/10/2016   TSH 1.030 07/21/2015   TSH 0.755 12/02/2014   T4TOTAL 6.7 06/10/2016   Lab Results  Component Value Date   BUN 38.4 (H) 06/10/2016   BUN  32 (H) 02/29/2016   BUN 22 (H) 02/07/2016   Lab Results  Component Value Date   HGBA1C 5.9 (H) 12/20/2015   HGBA1C 6.0 (H) 07/21/2015   HGBA1C 6.1 (H) 05/19/2015    Assessment/Plan  1. Anemia of chronic disease See discussion in HPI  2. CKD (chronic kidney disease) stage 3, GFR 30-59 ml/min stable  3. Controlled type 2 DM with peripheral circulatory disorder (HCC) controlled  4. Essential hypertension controlled  5. Arthralgia of lower leg, unspecified laterality Still wanting to have surgery  6. Chronic right shoulder pain Likely shoulder impingement syndrome. - Ambulatory referral to Physical Therapy - celecoxib (CELEBREX) 200 MG capsule; Take 1 capsule (200 mg total) by mouth 2 (two) times daily.  Dispense: 30 capsule; Refill: 2 - lidocaine (ASPERCREME W/LIDOCAINE) 4 % cream; Apply 4 times daily to right shoulder  Dispense: 133 g; Refill: 5

## 2016-06-20 ENCOUNTER — Telehealth: Payer: Self-pay

## 2016-06-20 NOTE — Telephone Encounter (Signed)
Left detailed message with Dr.Green's response, I advise Pam to return call if questions or concerns
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
## 2016-06-21 ENCOUNTER — Telehealth: Payer: Self-pay | Admitting: Oncology

## 2016-06-21 NOTE — Telephone Encounter (Signed)
Scheduled appts per 4/17 los. Patients daughter is aware of appt date and time.

## 2016-06-24 ENCOUNTER — Telehealth: Payer: Self-pay

## 2016-06-24 ENCOUNTER — Other Ambulatory Visit: Payer: Self-pay

## 2016-06-24 MED ORDER — FLUCONAZOLE 100 MG PO TABS: 100.0000 mg | ORAL_TABLET | Freq: Every day | ORAL | 0 refills | Status: DC

## 2016-06-24 NOTE — Telephone Encounter (Signed)
Send prescription for Diflucan 100 mg (3 tabs) One daily for vaginal yeast infection.

## 2016-06-24 NOTE — Telephone Encounter (Signed)
Patient states she is having a lot of itching in her vaginal area and rectum.    Patient sates she has not changed washing detergent or soap.  Started on 06/23/2016  Patient wanted to know if you could send something in for her or should she come.

## 2016-06-24 NOTE — Telephone Encounter (Signed)
Medication sent to pharmacy  

## 2016-06-25 ENCOUNTER — Ambulatory Visit
Admission: RE | Admit: 2016-06-25 | Discharge: 2016-06-25 | Disposition: A | Discharge status: Home / Self Care | Payer: Medicare Other | Source: Ambulatory Visit | Attending: Internal Medicine | Admitting: Internal Medicine

## 2016-06-25 DIAGNOSIS — Z1231 Encounter for screening mammogram for malignant neoplasm of breast: Secondary | ICD-10-CM | POA: Diagnosis not present

## 2016-06-26 ENCOUNTER — Ambulatory Visit: Payer: Medicare Other | Attending: Internal Medicine | Admitting: Physical Therapy

## 2016-06-26 ENCOUNTER — Encounter: Payer: Self-pay | Admitting: Physical Therapy

## 2016-06-26 ENCOUNTER — Ambulatory Visit: Payer: Medicare Other | Admitting: Podiatry

## 2016-06-26 DIAGNOSIS — M6281 Muscle weakness (generalized): Secondary | ICD-10-CM | POA: Insufficient documentation

## 2016-06-26 DIAGNOSIS — R278 Other lack of coordination: Secondary | ICD-10-CM | POA: Diagnosis not present

## 2016-06-26 DIAGNOSIS — R2689 Other abnormalities of gait and mobility: Secondary | ICD-10-CM | POA: Insufficient documentation

## 2016-06-26 NOTE — Patient Instructions (Addendum)
   Requested exercises for preparation for TKA-handout to be provided at next visit.

## 2016-06-26 NOTE — Therapy (Signed)
[facility] Outpatient Rehabilitation [facility] [address] [place], [place], [address] Phone: [phone]   Fax:  [phone]  Physical Therapy Evaluation  Patient Details  Name: Jessica Hawkins MRN: [id number] Date of Birth: [birth year] Referring Provider: Kimber Relic, MD  Encounter Date: 06/26/2016      PT End of Session - 06/26/16 0937    Visit Number 1   Number of Visits 9   Date for PT Re-Evaluation 07/26/16   Authorization Type MCR- KX at visit 15   PT Start Time 0952   PT Stop Time 1047   PT Time Calculation (min) 55 min   Activity Tolerance Patient tolerated treatment well   Behavior During Therapy [facility] for tasks assessed/performed      Past Medical History:  Diagnosis Date  . Acute blood loss anemia   . Anginal pain [facility])    'i haven't had them in a long time but i used to'  . Arthritis    Osteoarthritis  . BBB (bundle branch block)    RT  . Coronary atherosclerosis of unspecified type of vessel, native or graft   . Debility, unspecified   . Diabetes mellitus without complication (HCC)    Diet controlled  . Diaphragmatic hernia without mention of obstruction or gangrene   . Disturbance of skin sensation   . Edema   . Fibrocystic breast   . Gait disorder   . GERD (gastroesophageal reflux disease) 06/29/2011  . Hiatal hernia   . History of transfusion of packed red blood cells   . Hyperlipidemia   . Hypertension 06/29/11  . Hypothyroid 06/29/2011  . Insomnia   . OAB (overactive bladder)   . Osteoporosis   . Platelet disorder (HCC)    ? Von Willebrand; abnormal PLT function assay 07/04/11   . Unspecified constipation   . Unspecified sleep apnea   . Unspecified urinary incontinence   . Unspecified vitamin D deficiency   . Unsteady gait   . Vertigo     Past Surgical History:  Procedure Laterality Date  . EYE SURGERY Right 2005   Cataract surgery  . EYE SURGERY Left 2006   Cataract surgery  . FEMUR IM NAIL  06/29/2011   Procedure:  INTRAMEDULLARY (IM) NAIL FEMORAL;  Surgeon: Verlee Rossetti, MD;  Location: [facility] OR;  Service: Orthopedics;  Laterality: Left;  . HIP SURGERY Right 2002   ORIF  . ROTATOR CUFF REPAIR Left 1999   Tear  . THYROIDECTOMY  1952  . TONSILLECTOMY    . TOTAL KNEE ARTHROPLASTY Left 05/29/2015   Procedure: TOTAL KNEE ARTHROPLASTY;  Surgeon: Dannielle Huh, MD;  Location: [facility] OR;  Service: Orthopedics;  Laterality: Left;    There were no vitals filed for this visit.       Subjective Assessment - 06/26/16 1007    Subjective About 3 weeks ago pt began hurting, thought it was return of bursitis. Was unable to move arm. Has been taking medications and takes a balance class. Has pain when walking with walker. Is aiming to have R TKA this summer. Is getting iron injections.    Patient Stated Goals reach arm overhead, lifting, reaching, improve use of walker   Currently in Pain? Yes   Pain Score 4    Pain Location Shoulder   Pain Orientation Right   Pain Descriptors / Indicators Sore   Aggravating Factors  lifting arm   Pain Relieving Factors medications            [facility] PT  Assessment - 06/26/16 0001      Assessment   Medical Diagnosis R shoulder impingement syndrome   Referring Provider Kimber Relic, MD   Hand Dominance Right   Prior Therapy not this year     Precautions   Precautions Fall   Precaution Comments diabetes     Restrictions   Weight Bearing Restrictions No     Balance Screen   Has the patient fallen in the past 6 months Yes   How many times? 2   Has the patient had a decrease in activity level because of a fear of falling?  Yes   Is the patient reluctant to leave their home because of a fear of falling?  No  requires assistance     Home Environment   Living Environment Private residence   Living Arrangements Children   Additional Comments stairs at home, avoids them. Caretaker assistance     Prior Function   Level of Independence Independent with basic ADLs      Cognition   Overall Cognitive Status Within Functional Limits for tasks assessed     Observation/Other Assessments   Focus on Therapeutic Outcomes (FOTO)  46% ability (goal 61%)     Sensation   Additional Comments occasional N/T in R arm, occasional HA     ROM / Strength   AROM / PROM / Strength AROM;PROM;Strength     AROM   AROM Assessment Site Shoulder   Right/Left Shoulder Right;Left   Right Shoulder Flexion 100 Degrees   Left Shoulder Flexion 115 Degrees     PROM   PROM Assessment Site Shoulder   Right/Left Shoulder Right   Right Shoulder Flexion 130 Degrees     Strength   Strength Assessment Site Shoulder   Right/Left Shoulder Right   Right Shoulder Flexion 3-/5   Right Shoulder Internal Rotation 4+/5   Right Shoulder External Rotation 4+/5     Ambulation/Gait   Assistive device 4-wheeled walker;Other (Comment)  wheel chair, transfer chair   Gait Comments uses walker at home                   [facility] Adult PT Treatment/Exercise - 06/26/16 0001      Exercises   Exercises Shoulder;Other Exercises   Other Exercises  putty work for hand/grip strength     Shoulder Exercises: Supine   Flexion Limitations pain free range with wand     Shoulder Exercises: Seated   Theraband Level (Shoulder External Rotation) Level 1 (Yellow)   External Rotation Limitations elbows at sides   Other Seated Exercises upright posture     Manual Therapy   Manual Therapy Passive ROM;Soft tissue mobilization   Soft tissue mobilization infraspinatus, supraspinatus   Passive ROM R GHJ flexion                PT Education - 06/26/16 1053    Education provided Yes   Education Details exercise form/rationale, HEP, POC, anatomy of condition, overuse and misuse of shoulder joint, posture   Person(s) Educated Patient;Child(ren);Caregiver(s)   Methods Explanation;Demonstration;Tactile cues;Verbal cues;Handout   Comprehension Verbalized understanding;Returned  demonstration;Verbal cues required;Tactile cues required;Need further instruction          PT Short Term Goals - 06/26/16 1202      PT SHORT TERM GOAL #1   Title Pt will demo GHJ flexion ROM wtihin 5 deg of L arm to improve functional reach by 5/25   Baseline see flowsheet   Time 4   Period Weeks  Status New     PT SHORT TERM GOAL #2   Title FOTO to 61% ability to indicate significant improvement in functional ability   Baseline 46% ability at eval   Time 4   Period Weeks   Status New     PT SHORT TERM GOAL #3   Title Pt will demo good upright posture while ambulating with walker for 13ft to decrease overuse of GHJ during houshold ambulation   Baseline caregiver reports pt is flexed and pushes heavily through her arms to decrease weight in knee   Time 4   Period Weeks   Status New     PT SHORT TERM GOAL #4   Title Pt will be able to lift and move a 5lb weight using R UE without limitations by pain to improve functional daily lifting ability   Baseline unable at eval due to pain   Time 4   Period Weeks   Status New     PT SHORT TERM GOAL #5   Title Pt and caregiver with verbalize comfort and understanding with long term HEP for continued strengthening of GHJ.    Baseline will progress as tolerated and appropraite   Time 4   Period Weeks   Status New                  Plan - 07-05-16 1047    Clinical Impression Statement Pt presents to PT with complaints of R shoulder pain of insidious onset beginning about 3 weeks ago. History of shoulder bursitis. Pt is accompanied by caretaker and daughter. Pt requires mod-max assist in transfers and relies heavily on walker for support (pt arrived in chair today due to weather but uses walker at home), caretaker reports pt walks flexed at waist and places heavy pressure through arms. Pt with slouched posture and is able to obtain straight posture but unable to sit upright through waist with back unsupported. Pt would like to  have shoulder issue taken care of before TKA this summer(tentitive). Educated on importance of placing weight in knee even though it hurts to decrease overuse/misuse of other joints in preparation for surgery. Pt has memory deficits but has caretakers and family to assist in HEP. Pt will benefit from skilled PT in order to improve GHJ stability, ROM and functional use to decrease pain.    Rehab Potential Good   PT Frequency 2x / week   PT Duration 4 weeks   PT Treatment/Interventions ADLs/Self Care Home Management;Cryotherapy;Electrical Stimulation;Iontophoresis /ml Dexamethasone;Functional mobility training;Gait training;Ultrasound;Traction;Moist Heat;Therapeutic activities;Therapeutic exercise;Neuromuscular re-education;Patient/family education;Passive range of motion;Manual techniques;Dry needling;Taping   PT Next Visit Plan educate on proper use of walker, GHJ AAROM, postural strength & periscapular endurance   PT Home Exercise Plan putty, yellow tband ER, supine flexion with wand, sit up tall.    Consulted and Agree with Plan of Care Patient;Family member/caregiver   Family Member Consulted daughter & caregiver      Patient will benefit from skilled therapeutic intervention in order to improve the following deficits and impairments:  Difficulty walking, Impaired UE functional use, Increased muscle spasms, Decreased activity tolerance, Pain, Improper body mechanics, Impaired flexibility, Decreased strength, Decreased mobility, Postural dysfunction  Visit Diagnosis: Muscle weakness (generalized) - Plan: PT plan of care cert/re-cert  Other lack of coordination - Plan: PT plan of care cert/re-cert  Other abnormalities of gait and mobility - Plan: PT plan of care cert/re-cert      G-Codes - 2016-07-05 1055    Functional Assessment Tool Used (  Outpatient Only) FOTO 46% ability (goal 61%), ROM, pain reports, clinical judgement   Functional Limitation Carrying, moving and handling objects    Carrying, Moving and Handling Objects Current Status [phone]) At least 60 percent but less than 80 percent impaired, limited or restricted   Carrying, Moving and Handling Objects Goal Status ([id number]) At least 20 percent but less than 40 percent impaired, limited or restricted       Problem List Patient Active Problem List   Diagnosis Date Noted  . Shoulder impingement syndrome, right 06/19/2016  . Anemia of chronic disease 06/18/2016  . Anemia of chronic kidney failure 06/18/2016  . Exposure to influenza 03/19/2016  . Memory deficit 03/19/2016  . CKD (chronic kidney disease) stage 3, GFR 30-59 ml/min 02/08/2016  . Cellulitis of left anterior lower leg 01/31/2016  . LLQ abdominal pain 12/06/2015  . Self-care deficit in patient living alone 11/08/2015  . Peripheral nerve contusion 08/15/2015  . Foot drop, left 07/18/2015  . S/P total knee arthroplasty 05/29/2015  . Bleeding disorder (HCC) 05/22/2015  . Primary osteoarthritis of both knees 05/04/2015  . Nonspecific vaginitis 04/11/2015  . Recurrent UTI 03/07/2015  . Joint mouse 03/07/2015  . Shoulder pain 01/18/2015  . Pre-operative cardiovascular examination 12/07/2014  . Loss of weight 10/13/2013  . Constipation 10/06/2013  . Venous insufficiency (chronic) (peripheral) 02/02/2013  . Knee pain 12/16/2012  . Arthritis of knee, degenerative 10/29/2012  . Edema 07/23/2012  . Abnormality of gait 07/23/2012  . Urinary incontinence 07/23/2012  . Hyperlipidemia 07/23/2012  . Controlled type 2 DM with peripheral circulatory disorder (HCC) 07/23/2012  . GERD (gastroesophageal reflux disease) 06/29/2011  . HTN (hypertension) 06/29/2011  . Intertrochanteric fracture of left femur (HCC) 06/29/2011  . Arthritis 06/29/2011  . Hypothyroid 06/29/2011   Lounell Schumacher C. Katharine Rochefort PT, DPT 06/26/16 12:17 PM   [facility] Outpatient Rehabilitation [facility] [address] [place], [place], [address] Phone: [phone]   Fax:   [phone]  Name: SWAY GUTTIERREZ MRN: [id number] Date of Birth: 03-28-1922

## 2016-06-28 ENCOUNTER — Ambulatory Visit (HOSPITAL_BASED_OUTPATIENT_CLINIC_OR_DEPARTMENT_OTHER): Payer: Medicare Other

## 2016-06-28 VITALS — BP 161/61 | HR 71 | Temp 97.7°F | Resp 16

## 2016-06-28 DIAGNOSIS — N189 Chronic kidney disease, unspecified: Secondary | ICD-10-CM

## 2016-06-28 DIAGNOSIS — D638 Anemia in other chronic diseases classified elsewhere: Secondary | ICD-10-CM

## 2016-06-28 DIAGNOSIS — D509 Iron deficiency anemia, unspecified: Secondary | ICD-10-CM | POA: Diagnosis present

## 2016-06-28 DIAGNOSIS — D631 Anemia in chronic kidney disease: Secondary | ICD-10-CM

## 2016-06-28 DIAGNOSIS — N181 Chronic kidney disease, stage 1: Secondary | ICD-10-CM

## 2016-06-28 MED ORDER — SODIUM CHLORIDE 0.9 % IV SOLN
750.0000 mg | Freq: Once | INTRAVENOUS | Status: AC
  Administered 2016-06-28: 750 mg via INTRAVENOUS
  Filled 2016-06-28: qty 15

## 2016-06-28 NOTE — Patient Instructions (Signed)
Ferric carboxymaltose injection What is this medicine? FERRIC CARBOXYMALTOSE (ferr-ik car-box-ee-mol-toes) is an iron complex. Iron is used to make healthy red blood cells, which carry oxygen and nutrients throughout the body. This medicine is used to treat anemia in people with chronic kidney disease or people who cannot take iron by mouth. This medicine may be used for other purposes; ask your health care provider or pharmacist if you have questions. COMMON BRAND NAME(S): Injectafer What should I tell my health care provider before I take this medicine? They need to know if you have any of these conditions: -anemia not caused by low iron levels -high levels of iron in the blood -liver disease -an unusual or allergic reaction to iron, other medicines, foods, dyes, or preservatives -pregnant or trying to get pregnant -breast-feeding How should I use this medicine? This medicine is for infusion into a vein. It is given by a health care professional in a hospital or clinic setting. Talk to your pediatrician regarding the use of this medicine in children. Special care may be needed. Overdosage: If you think you have taken too much of this medicine contact a poison control center or emergency room at once. NOTE: This medicine is only for you. Do not share this medicine with others. What if I miss a dose? It is important not to miss your dose. Call your doctor or health care professional if you are unable to keep an appointment. What may interact with this medicine? Do not take this medicine with any of the following medications: -deferoxamine -dimercaprol -other iron products This medicine may also interact with the following medications: -chloramphenicol -deferasirox This list may not describe all possible interactions. Give your health care provider a list of all the medicines, herbs, non-prescription drugs, or dietary supplements you use. Also tell them if you smoke, drink alcohol, or use  illegal drugs. Some items may interact with your medicine. What should I watch for while using this medicine? Visit your doctor or health care professional regularly. Tell your doctor if your symptoms do not start to get better or if they get worse. You may need blood work done while you are taking this medicine. You may need to follow a special diet. Talk to your doctor. Foods that contain iron include: whole grains/cereals, dried fruits, beans, or peas, leafy green vegetables, and organ meats (liver, kidney). What side effects may I notice from receiving this medicine? Side effects that you should report to your doctor or health care professional as soon as possible: -allergic reactions like skin rash, itching or hives, swelling of the face, lips, or tongue -breathing problems -changes in blood pressure -feeling faint or lightheaded, falls -flushing, sweating, or hot feelings Side effects that usually do not require medical attention (report to your doctor or health care professional if they continue or are bothersome): -changes in taste -constipation -dizziness -headache -nausea -pain, redness, or irritation at site where injected -vomiting This list may not describe all possible side effects. Call your doctor for medical advice about side effects. You may report side effects to FDA at 1-800-FDA-1088. Where should I keep my medicine? This drug is given in a hospital or clinic and will not be stored at home. NOTE: This sheet is a summary. It may not cover all possible information. If you have questions about this medicine, talk to your doctor, pharmacist, or health care provider.  2018 Elsevier/Gold Standard (2015-03-23 11:20:47)  

## 2016-07-02 ENCOUNTER — Encounter: Payer: Self-pay | Admitting: Internal Medicine

## 2016-07-03 ENCOUNTER — Ambulatory Visit: Payer: Medicare Other | Attending: Internal Medicine | Admitting: Physical Therapy

## 2016-07-03 ENCOUNTER — Encounter: Payer: Self-pay | Admitting: Physical Therapy

## 2016-07-03 DIAGNOSIS — M6281 Muscle weakness (generalized): Secondary | ICD-10-CM

## 2016-07-03 DIAGNOSIS — R2689 Other abnormalities of gait and mobility: Secondary | ICD-10-CM | POA: Diagnosis not present

## 2016-07-03 DIAGNOSIS — R278 Other lack of coordination: Secondary | ICD-10-CM

## 2016-07-03 NOTE — Therapy (Signed)
[facility] Outpatient Rehabilitation [facility] [address] [place], [place], [address] Phone: [phone]   Fax:  [phone]  Physical Therapy Treatment  Patient Details  Name: Jessica Hawkins MRN: [id number] Date of Birth: Jul 09, 1922 Referring Provider: Kimber Relic, MD  Encounter Date: 07/03/2016      PT End of Session - 07/03/16 1156    Visit Number 2   Number of Visits 9   Date for PT Re-Evaluation 07/26/16   Authorization Type MCR- KX at visit 15   PT Start Time 1151   PT Stop Time 1232   PT Time Calculation (min) 41 min   Activity Tolerance Patient tolerated treatment well   Behavior During Therapy [facility] for tasks assessed/performed      Past Medical History:  Diagnosis Date  . Acute blood loss anemia   . Anginal pain [facility])    'i haven't had them in a long time but i used to'  . Arthritis    Osteoarthritis  . BBB (bundle branch block)    RT  . Coronary atherosclerosis of unspecified type of vessel, native or graft   . Debility, unspecified   . Diabetes mellitus without complication (HCC)    Diet controlled  . Diaphragmatic hernia without mention of obstruction or gangrene   . Disturbance of skin sensation   . Edema   . Fibrocystic breast   . Gait disorder   . GERD (gastroesophageal reflux disease) 06/29/2011  . Hiatal hernia   . History of transfusion of packed red blood cells   . Hyperlipidemia   . Hypertension 06/29/11  . Hypothyroid 06/29/2011  . Insomnia   . OAB (overactive bladder)   . Osteoporosis   . Platelet disorder (HCC)    ? Von Willebrand; abnormal PLT function assay 07/04/11   . Unspecified constipation   . Unspecified sleep apnea   . Unspecified urinary incontinence   . Unspecified vitamin D deficiency   . Unsteady gait   . Vertigo     Past Surgical History:  Procedure Laterality Date  . EYE SURGERY Right 2005   Cataract surgery  . EYE SURGERY Left 2006   Cataract surgery  . FEMUR IM NAIL  06/29/2011   Procedure:  INTRAMEDULLARY (IM) NAIL FEMORAL;  Surgeon: Verlee Rossetti, MD;  Location: [facility] OR;  Service: Orthopedics;  Laterality: Left;  . HIP SURGERY Right 2002   ORIF  . ROTATOR CUFF REPAIR Left 1999   Tear  . THYROIDECTOMY  1952  . TONSILLECTOMY    . TOTAL KNEE ARTHROPLASTY Left 05/29/2015   Procedure: TOTAL KNEE ARTHROPLASTY;  Surgeon: Dannielle Huh, MD;  Location: [facility] OR;  Service: Orthopedics;  Laterality: Left;    There were no vitals filed for this visit.      Subjective Assessment - 07/03/16 1155    Subjective "It hurts a little today"   Currently in Pain? Yes   Pain Score 5    Pain Location Shoulder   Pain Orientation Right                         [facility] Adult PT Treatment/Exercise - 07/03/16 0001      Therapeutic Activites    Therapeutic Activities ADL's   ADL's bed mobility, sit<>stand     Exercises   Other Exercises  SAQ, SLR, hip abd, standing glut sets and heel raises     Shoulder Exercises: Supine   External Rotation 20 reps   Theraband Level (Shoulder External  Rotation) Level 1 (Yellow)   Flexion Limitations hands in green loop     Shoulder Exercises: Seated   Row 20 reps  upright posture   Theraband Level (Shoulder Row) Level 2 (Red)   Horizontal ABduction 20 reps   Theraband Level (Shoulder Horizontal ABduction) Level 2 (Red)     Shoulder Exercises: Pulleys   Flexion 3 minutes                  PT Short Term Goals - 06/26/16 1202      PT SHORT TERM GOAL #1   Title Pt will demo GHJ flexion ROM wtihin 5 deg of L arm to improve functional reach by 5/25   Baseline see flowsheet   Time 4   Period Weeks   Status New     PT SHORT TERM GOAL #2   Title FOTO to 61% ability to indicate significant improvement in functional ability   Baseline 46% ability at eval   Time 4   Period Weeks   Status New     PT SHORT TERM GOAL #3   Title Pt will demo good upright posture while ambulating with walker for 11ft to decrease overuse of GHJ  during houshold ambulation   Baseline caregiver reports pt is flexed and pushes heavily through her arms to decrease weight in knee   Time 4   Period Weeks   Status New     PT SHORT TERM GOAL #4   Title Pt will be able to lift and move a 5lb weight using R UE without limitations by pain to improve functional daily lifting ability   Baseline unable at eval due to pain   Time 4   Period Weeks   Status New     PT SHORT TERM GOAL #5   Title Pt and caregiver with verbalize comfort and understanding with long term HEP for continued strengthening of GHJ.    Baseline will progress as tolerated and appropraite   Time 4   Period Weeks   Status New                  Plan - 07/03/16 1249    Clinical Impression Statement Pt tolerated exercises well today without complaints of pain. Requested exercises to strengthen before TKA so we reviewed those today. Also reviewed bed mobility and sit<>stand to decrease improper use of upper extremities.    PT Next Visit Plan educate on proper use of walker, GHJ AAROM, postural strength & periscapular endurance   PT Home Exercise Plan putty, yellow tband ER, supine flexion with wand, sit up tall. bed mobility, TKA exercises   Consulted and Agree with Plan of Care Patient;Family member/caregiver   Family Member Consulted caregiver      Patient will benefit from skilled therapeutic intervention in order to improve the following deficits and impairments:     Visit Diagnosis: Muscle weakness (generalized)  Other lack of coordination  Other abnormalities of gait and mobility     Problem List Patient Active Problem List   Diagnosis Date Noted  . Shoulder impingement syndrome, right 06/19/2016  . Anemia of chronic disease 06/18/2016  . Anemia of chronic kidney failure 06/18/2016  . Exposure to influenza 03/19/2016  . Memory deficit 03/19/2016  . CKD (chronic kidney disease) stage 3, GFR 30-59 ml/min 02/08/2016  . Cellulitis of left  anterior lower leg 01/31/2016  . LLQ abdominal pain 12/06/2015  . Self-care deficit in patient living alone 11/08/2015  . Peripheral nerve contusion  08/15/2015  . Foot drop, left 07/18/2015  . S/P total knee arthroplasty 05/29/2015  . Bleeding disorder (HCC) 05/22/2015  . Primary osteoarthritis of both knees 05/04/2015  . Nonspecific vaginitis 04/11/2015  . Recurrent UTI 03/07/2015  . Joint mouse 03/07/2015  . Shoulder pain 01/18/2015  . Pre-operative cardiovascular examination 12/07/2014  . Loss of weight 10/13/2013  . Constipation 10/06/2013  . Venous insufficiency (chronic) (peripheral) 02/02/2013  . Knee pain 12/16/2012  . Arthritis of knee, degenerative 10/29/2012  . Edema 07/23/2012  . Abnormality of gait 07/23/2012  . Urinary incontinence 07/23/2012  . Hyperlipidemia 07/23/2012  . Controlled type 2 DM with peripheral circulatory disorder (HCC) 07/23/2012  . GERD (gastroesophageal reflux disease) 06/29/2011  . HTN (hypertension) 06/29/2011  . Intertrochanteric fracture of left femur (HCC) 06/29/2011  . Arthritis 06/29/2011  . Hypothyroid 06/29/2011    Zykeem Bauserman C. Chanler Schreiter PT, DPT 07/03/16 12:52 PM   [facility] Outpatient Rehabilitation [facility] [address] [place], [place], [address] Phone: [phone]   Fax:  [phone]  Name: NYEEMA WANT MRN: [id number] Date of Birth: 16-Feb-1923

## 2016-07-05 ENCOUNTER — Ambulatory Visit (HOSPITAL_BASED_OUTPATIENT_CLINIC_OR_DEPARTMENT_OTHER): Payer: Medicare Other

## 2016-07-05 VITALS — BP 138/53 | HR 68

## 2016-07-05 DIAGNOSIS — N181 Chronic kidney disease, stage 1: Secondary | ICD-10-CM

## 2016-07-05 DIAGNOSIS — D509 Iron deficiency anemia, unspecified: Secondary | ICD-10-CM

## 2016-07-05 DIAGNOSIS — N189 Chronic kidney disease, unspecified: Secondary | ICD-10-CM

## 2016-07-05 DIAGNOSIS — D638 Anemia in other chronic diseases classified elsewhere: Secondary | ICD-10-CM

## 2016-07-05 DIAGNOSIS — D631 Anemia in chronic kidney disease: Secondary | ICD-10-CM

## 2016-07-05 MED ORDER — SODIUM CHLORIDE 0.9 % IV SOLN
750.0000 mg | Freq: Once | INTRAVENOUS | Status: AC
  Administered 2016-07-05: 750 mg via INTRAVENOUS
  Filled 2016-07-05: qty 15

## 2016-07-08 ENCOUNTER — Ambulatory Visit: Payer: Medicare Other | Admitting: Physical Therapy

## 2016-07-08 ENCOUNTER — Encounter: Payer: Self-pay | Admitting: Physical Therapy

## 2016-07-08 DIAGNOSIS — R2689 Other abnormalities of gait and mobility: Secondary | ICD-10-CM | POA: Diagnosis not present

## 2016-07-08 DIAGNOSIS — R278 Other lack of coordination: Secondary | ICD-10-CM | POA: Diagnosis not present

## 2016-07-08 DIAGNOSIS — M6281 Muscle weakness (generalized): Secondary | ICD-10-CM | POA: Diagnosis not present

## 2016-07-08 NOTE — Therapy (Signed)
[facility] [address] [place], [place], [address] Phone: [phone]   Fax:  [phone]  Physical Therapy Treatment  Patient Details  Name: Jessica Hawkins MRN: [id number] Date of Birth: [birth year] Referring Provider: Kimber RelicArthur G Green, MD  Encounter Date: 07/08/2016      PT End of Session - 07/08/16 1335    Visit Number 3   Number of Visits 9   Date for PT Re-Evaluation 07/26/16   Authorization Type MCR- KX at visit 15   PT Start Time 1330   PT Stop Time 1418   PT Time Calculation (min) 48 min   Activity Tolerance Patient tolerated treatment well   Behavior During Therapy [facility] for tasks assessed/performed      Past Medical History:  Diagnosis Date  . Acute blood loss anemia   . Anginal pain [facility](HCC)    'i haven't had them in a long time but i used to'  . Arthritis    Osteoarthritis  . BBB (bundle branch block)    RT  . Coronary atherosclerosis of unspecified type of vessel, native or graft   . Debility, unspecified   . Diabetes mellitus without complication (HCC)    Diet controlled  . Diaphragmatic hernia without mention of obstruction or gangrene   . Disturbance of skin sensation   . Edema   . Fibrocystic breast   . Gait disorder   . GERD (gastroesophageal reflux disease) 06/29/2011  . Hiatal hernia   . History of transfusion of packed red blood cells   . Hyperlipidemia   . Hypertension 06/29/11  . Hypothyroid 06/29/2011  . Insomnia   . OAB (overactive bladder)   . Osteoporosis   . Platelet disorder (HCC)    ? Von Willebrand; abnormal PLT function assay 07/04/11   . Unspecified constipation   . Unspecified sleep apnea   . Unspecified urinary incontinence   . Unspecified vitamin D deficiency   . Unsteady gait   . Vertigo     Past Surgical History:  Procedure Laterality Date  . EYE SURGERY Right 2005   Cataract surgery  . EYE SURGERY Left 2006   Cataract surgery  . FEMUR IM NAIL  06/29/2011   Procedure:  INTRAMEDULLARY (IM) NAIL FEMORAL;  Surgeon: Verlee RossettiSteven R Norris, MD;  [facility];  Service: Orthopedics;  Laterality: Left;  . HIP SURGERY Right 2002   ORIF  . ROTATOR CUFF REPAIR Left 1999   Tear  . THYROIDECTOMY  1952  . TONSILLECTOMY    . TOTAL KNEE ARTHROPLASTY Left 05/29/2015   Procedure: TOTAL KNEE ARTHROPLASTY;  Surgeon: Dannielle HuhSteve Lucey, MD;  Location: [facility] OR;  Service: Orthopedics;  Laterality: Left;    There were no vitals filed for this visit.      Subjective Assessment - 07/08/16 1335    Subjective Pt reports her R arm hurts but not all the time. Is taking a prescription for inflammation.    Patient Stated Goals reach arm overhead, lifting, reaching, improve use of walker   Currently in Pain? Yes   Pain Score 5    Pain Location Shoulder   Pain Orientation Right   Aggravating Factors  reaching   Pain Relieving Factors rest            [facility] PT Assessment - 07/08/16 0001      AROM   Right Shoulder Flexion 95 Degrees  [facility] Adult PT Treatment/Exercise - 07/08/16 0001      Therapeutic Activites    ADL's walking with walker, getting into/out of chairs     Elbow Exercises   Elbow Flexion Other (comment)  therabar frownies     Shoulder Exercises: Supine   Horizontal ABduction 15 reps   Flexion Limitations wand 1lb, up incline   Other Supine Exercises supine chest press wand 1lb     Shoulder Exercises: Seated   Row 20 reps   Theraband Level (Shoulder Row) Level 2 (Red)   External Rotation 20 reps   Theraband Level (Shoulder External Rotation) Level 2 (Red)   Flexion Limitations reaches 1# 3x10   Other Seated Exercises biceps curls 1# each hand     Shoulder Exercises: Pulleys   Flexion 3 minutes                PT Education - 07/08/16 1507    Education provided Yes   Education Details exercise form/rationale, HEP-including caregiver, reaching, posture   Person(s) Educated Patient;Caregiver(s)   Methods  Explanation;Demonstration;Tactile cues;Verbal cues   Comprehension Verbalized understanding;Returned demonstration;Verbal cues required;Tactile cues required;Need further instruction          PT Short Term Goals - 06/26/16 1202      PT SHORT TERM GOAL #1   Title Pt will demo GHJ flexion ROM wtihin 5 deg of L arm to improve functional reach by 5/25   Baseline see flowsheet   Time 4   Period Weeks   Status New     PT SHORT TERM GOAL #2   Title FOTO to 61% ability to indicate significant improvement in functional ability   Baseline 46% ability at eval   Time 4   Period Weeks   Status New     PT SHORT TERM GOAL #3   Title Pt will demo good upright posture while ambulating with walker for 84ft to decrease overuse of GHJ during houshold ambulation   Baseline caregiver reports pt is flexed and pushes heavily through her arms to decrease weight in knee   Time 4   Period Weeks   Status New     PT SHORT TERM GOAL #4   Title Pt will be able to lift and move a 5lb weight using R UE without limitations by pain to improve functional daily lifting ability   Baseline unable at eval due to pain   Time 4   Period Weeks   Status New     PT SHORT TERM GOAL #5   Title Pt and caregiver with verbalize comfort and understanding with long term HEP for continued strengthening of GHJ.    Baseline will progress as tolerated and appropraite   Time 4   Period Weeks   Status New                  Plan - 07/08/16 1504    Clinical Impression Statement Pt denied pain when ambulating with walker today which caretaker reports is a big improvement. No changes in ROM noted but pt is able to tolerate weight added to exercises without pain and independent in bed mobility and sit<>stand with verbal cuing.    PT Next Visit Plan educate on proper use of walker & bed/chair mobility, GHJ AAROM, postural strength & periscapular endurance   PT Home Exercise Plan putty, yellow tband ER, supine flexion with  wand, sit up tall. bed mobility, TKA exercises   Consulted and Agree with Plan of Care Patient;Family member/caregiver  Family Member Consulted caregiver      Patient will benefit from skilled therapeutic intervention in order to improve the following deficits and impairments:     Visit Diagnosis: Muscle weakness (generalized)  Other lack of coordination  Other abnormalities of gait and mobility     Problem List Patient Active Problem List   Diagnosis Date Noted  . Shoulder impingement syndrome, right 06/19/2016  . Anemia of chronic disease 06/18/2016  . Anemia of chronic kidney failure 06/18/2016  . Exposure to influenza 03/19/2016  . Memory deficit 03/19/2016  . CKD (chronic kidney disease) stage 3, GFR 30-59 ml/min 02/08/2016  . Cellulitis of left anterior lower leg 01/31/2016  . LLQ abdominal pain 12/06/2015  . Self-care deficit in patient living alone 11/08/2015  . Peripheral nerve contusion 08/15/2015  . Foot drop, left 07/18/2015  . S/P total knee arthroplasty 05/29/2015  . Bleeding disorder (HCC) 05/22/2015  . Primary osteoarthritis of both knees 05/04/2015  . Nonspecific vaginitis 04/11/2015  . Recurrent UTI 03/07/2015  . Joint mouse 03/07/2015  . Shoulder pain 01/18/2015  . Pre-operative cardiovascular examination 12/07/2014  . Loss of weight 10/13/2013  . Constipation 10/06/2013  . Venous insufficiency (chronic) (peripheral) 02/02/2013  . Knee pain 12/16/2012  . Arthritis of knee, degenerative 10/29/2012  . Edema 07/23/2012  . Abnormality of gait 07/23/2012  . Urinary incontinence 07/23/2012  . Hyperlipidemia 07/23/2012  . Controlled type 2 DM with peripheral circulatory disorder (HCC) 07/23/2012  . GERD (gastroesophageal reflux disease) 06/29/2011  . HTN (hypertension) 06/29/2011  . Intertrochanteric fracture of left femur (HCC) 06/29/2011  . Arthritis 06/29/2011  . Hypothyroid 06/29/2011    Cortney Mckinney C. Ernie Sagrero PT, DPT 07/08/16 3:08 PM   [facility]  [facility] [address] [place], [place], [address] Phone: [phone]   Fax:  [phone]  Name: CELINE DISHMAN MRN: [id number] Date of Birth: 1922-04-02

## 2016-07-09 ENCOUNTER — Telehealth: Payer: Self-pay | Admitting: Internal Medicine

## 2016-07-09 NOTE — Telephone Encounter (Signed)
I called the patient's daughter to schedule her AWV-I with the 08/01/16 appt.  The phone rang several times then made a noise sounding like a fax machine.  There was no option to leave a voicemail message. VDM (DD)

## 2016-07-10 ENCOUNTER — Encounter: Payer: Self-pay | Admitting: Physical Therapy

## 2016-07-10 ENCOUNTER — Ambulatory Visit: Payer: Medicare Other | Admitting: Physical Therapy

## 2016-07-10 DIAGNOSIS — R2689 Other abnormalities of gait and mobility: Secondary | ICD-10-CM

## 2016-07-10 DIAGNOSIS — R278 Other lack of coordination: Secondary | ICD-10-CM | POA: Diagnosis not present

## 2016-07-10 DIAGNOSIS — M6281 Muscle weakness (generalized): Secondary | ICD-10-CM | POA: Diagnosis not present

## 2016-07-10 NOTE — Therapy (Signed)
[facility] [address] [place], [place], [address] Phone: [phone]   Fax:  [phone]  Physical Therapy Treatment  Patient Details  Name: Jessica Hawkins MRN: [id number] Date of Birth: August 20, 1922 Referring Provider: Kimber Relic, MD  Encounter Date: 07/10/2016      PT End of Session - 07/10/16 1152    Visit Number 4   Number of Visits 9   Date for PT Re-Evaluation 07/26/16   Authorization Type MCR- KX at visit 15   PT Start Time 1152  pt arrived late   PT Stop Time 1237   PT Time Calculation (min) 45 min   Activity Tolerance Patient tolerated treatment well   Behavior During Therapy [facility] for tasks assessed/performed      Past Medical History:  Diagnosis Date  . Acute blood loss anemia   . Anginal pain [facility])    'i haven't had them in a long time but i used to'  . Arthritis    Osteoarthritis  . BBB (bundle branch block)    RT  . Coronary atherosclerosis of unspecified type of vessel, native or graft   . Debility, unspecified   . Diabetes mellitus without complication (HCC)    Diet controlled  . Diaphragmatic hernia without mention of obstruction or gangrene   . Disturbance of skin sensation   . Edema   . Fibrocystic breast   . Gait disorder   . GERD (gastroesophageal reflux disease) 06/29/2011  . Hiatal hernia   . History of transfusion of packed red blood cells   . Hyperlipidemia   . Hypertension 06/29/11  . Hypothyroid 06/29/2011  . Insomnia   . OAB (overactive bladder)   . Osteoporosis   . Platelet disorder (HCC)    ? Von Willebrand; abnormal PLT function assay 07/04/11   . Unspecified constipation   . Unspecified sleep apnea   . Unspecified urinary incontinence   . Unspecified vitamin D deficiency   . Unsteady gait   . Vertigo     Past Surgical History:  Procedure Laterality Date  . EYE SURGERY Right 2005   Cataract surgery  . EYE SURGERY Left 2006   Cataract surgery  . FEMUR IM NAIL   06/29/2011   Procedure: INTRAMEDULLARY (IM) NAIL FEMORAL;  Surgeon: Verlee Rossetti, MD;  Location: [facility] OR;  Service: Orthopedics;  Laterality: Left;  . HIP SURGERY Right 2002   ORIF  . ROTATOR CUFF REPAIR Left 1999   Tear  . THYROIDECTOMY  1952  . TONSILLECTOMY    . TOTAL KNEE ARTHROPLASTY Left 05/29/2015   Procedure: TOTAL KNEE ARTHROPLASTY;  Surgeon: Dannielle Huh, MD;  Location: [facility] OR;  Service: Orthopedics;  Laterality: Left;    There were no vitals filed for this visit.      Subjective Assessment - 07/10/16 1152    Subjective pt reports shoulder is feeling good today. Reports shoulder feels better but reports pain level 7/10. Is using her arm more, may have overdone it.    Currently in Pain? Yes   Pain Score 7    Pain Location Shoulder   Pain Orientation Right            [facility] PT Assessment - 07/10/16 0001      PROM   Right Shoulder Flexion 130 Degrees  pt supine up wedge using her L arm AAROM                     [facility] Adult PT Treatment/Exercise -  07/10/16 0001      Therapeutic Activites    ADL's gait with walker, transitions     Shoulder Exercises: Supine   Flexion Limitations AAROM hands linked     Shoulder Exercises: Seated   Row 20 reps   Theraband Level (Shoulder Row) Level 3 (Green)   Flexion Limitations hands laced together     Shoulder Exercises: Pulleys   Flexion 3 minutes     Manual Therapy   Soft tissue mobilization R upper trap & deltoid   Passive ROM R GHJ all directions                PT Education - 07/10/16 1247    Education provided Yes   Education Details exercise form/rationale, time and cuing for supine<>sit and sit<>stand, wear tennis shoes instead of bedroom slippers for safety.     Person(s) Educated Patient;Child(ren)   Methods Explanation;Demonstration;Tactile cues;Verbal cues   Comprehension Verbalized understanding;Returned demonstration;Verbal cues required;Tactile cues required;Need further instruction           PT Short Term Goals - 06/26/16 1202      PT SHORT TERM GOAL #1   Title Pt will demo GHJ flexion ROM wtihin 5 deg of L arm to improve functional reach by 5/25   Baseline see flowsheet   Time 4   Period Weeks   Status New     PT SHORT TERM GOAL #2   Title FOTO to 61% ability to indicate significant improvement in functional ability   Baseline 46% ability at eval   Time 4   Period Weeks   Status New     PT SHORT TERM GOAL #3   Title Pt will demo good upright posture while ambulating with walker for 6130ft to decrease overuse of GHJ during houshold ambulation   Baseline caregiver reports pt is flexed and pushes heavily through her arms to decrease weight in knee   Time 4   Period Weeks   Status New     PT SHORT TERM GOAL #4   Title Pt will be able to lift and move a 5lb weight using R UE without limitations by pain to improve functional daily lifting ability   Baseline unable at eval due to pain   Time 4   Period Weeks   Status New     PT SHORT TERM GOAL #5   Title Pt and caregiver with verbalize comfort and understanding with long term HEP for continued strengthening of GHJ.    Baseline will progress as tolerated and appropraite   Time 4   Period Weeks   Status New                  Plan - 07/10/16 1249    Clinical Impression Statement Continues to require verbal cues for transitions and walking with walker but only CGA. Concordant pain in trigger points found in deltoid and reduced with treatment. Pt with facial expression change at 130 deg of shoulder flexion, otherwise no visual signs of pain.    PT Next Visit Plan educate on proper use of walker & bed/chair mobility, GHJ AAROM, postural strength & periscapular endurance   PT Home Exercise Plan putty, yellow tband ER, supine flexion with wand, sit up tall. bed mobility, TKA exercises   Consulted and Agree with Plan of Care Patient;Family member/caregiver   Family Member Consulted caregiver       Patient will benefit from skilled therapeutic intervention in order to improve the following deficits and impairments:  Visit Diagnosis: Muscle weakness (generalized)  Other lack of coordination  Other abnormalities of gait and mobility     Problem List Patient Active Problem List   Diagnosis Date Noted  . Shoulder impingement syndrome, right 06/19/2016  . Anemia of chronic disease 06/18/2016  . Anemia of chronic kidney failure 06/18/2016  . Exposure to influenza 03/19/2016  . Memory deficit 03/19/2016  . CKD (chronic kidney disease) stage 3, GFR 30-59 ml/min 02/08/2016  . Cellulitis of left anterior lower leg 01/31/2016  . LLQ abdominal pain 12/06/2015  . Self-care deficit in patient living alone 11/08/2015  . Peripheral nerve contusion 08/15/2015  . Foot drop, left 07/18/2015  . S/P total knee arthroplasty 05/29/2015  . Bleeding disorder (HCC) 05/22/2015  . Primary osteoarthritis of both knees 05/04/2015  . Nonspecific vaginitis 04/11/2015  . Recurrent UTI 03/07/2015  . Joint mouse 03/07/2015  . Shoulder pain 01/18/2015  . Pre-operative cardiovascular examination 12/07/2014  . Loss of weight 10/13/2013  . Constipation 10/06/2013  . Venous insufficiency (chronic) (peripheral) 02/02/2013  . Knee pain 12/16/2012  . Arthritis of knee, degenerative 10/29/2012  . Edema 07/23/2012  . Abnormality of gait 07/23/2012  . Urinary incontinence 07/23/2012  . Hyperlipidemia 07/23/2012  . Controlled type 2 DM with peripheral circulatory disorder (HCC) 07/23/2012  . GERD (gastroesophageal reflux disease) 06/29/2011  . HTN (hypertension) 06/29/2011  . Intertrochanteric fracture of left femur (HCC) 06/29/2011  . Arthritis 06/29/2011  . Hypothyroid 06/29/2011   Dee Maday C. Alvy Alsop PT, DPT 07/10/16 12:53 PM   [facility] [address] [place], [place], [address] Phone: [phone]   Fax:  [phone]  Name: Jessica Hawkins MRN: [id number] Date of Birth: Oct 20, 1922

## 2016-07-11 ENCOUNTER — Other Ambulatory Visit: Payer: Self-pay | Admitting: Internal Medicine

## 2016-07-12 ENCOUNTER — Telehealth: Payer: Self-pay | Admitting: Hematology

## 2016-07-12 ENCOUNTER — Encounter: Payer: Medicare Other | Admitting: Hematology

## 2016-07-12 ENCOUNTER — Other Ambulatory Visit: Payer: Self-pay | Admitting: *Deleted

## 2016-07-12 ENCOUNTER — Encounter: Payer: Self-pay | Admitting: *Deleted

## 2016-07-12 ENCOUNTER — Ambulatory Visit: Payer: Medicare Other | Admitting: Hematology

## 2016-07-12 ENCOUNTER — Other Ambulatory Visit (HOSPITAL_BASED_OUTPATIENT_CLINIC_OR_DEPARTMENT_OTHER): Payer: Medicare Other

## 2016-07-12 DIAGNOSIS — D509 Iron deficiency anemia, unspecified: Secondary | ICD-10-CM | POA: Diagnosis present

## 2016-07-12 DIAGNOSIS — D638 Anemia in other chronic diseases classified elsewhere: Secondary | ICD-10-CM

## 2016-07-12 LAB — CBC WITH DIFFERENTIAL/PLATELET
BASO%: 0.7 % (ref 0.0–2.0)
BASOS ABS: 0 10*3/uL (ref 0.0–0.1)
EOS ABS: 0 10*3/uL (ref 0.0–0.5)
EOS%: 1 % (ref 0.0–7.0)
HEMATOCRIT: 29.9 % — AB (ref 34.8–46.6)
HEMOGLOBIN: 9.4 g/dL — AB (ref 11.6–15.9)
LYMPH%: 27.4 % (ref 14.0–49.7)
MCH: 24.5 pg — AB (ref 25.1–34.0)
MCHC: 31.6 g/dL (ref 31.5–36.0)
MCV: 77.7 fL — AB (ref 79.5–101.0)
MONO#: 0.5 10*3/uL (ref 0.1–0.9)
MONO%: 11.7 % (ref 0.0–14.0)
NEUT#: 2.6 10*3/uL (ref 1.5–6.5)
NEUT%: 59.2 % (ref 38.4–76.8)
Platelets: 220 10*3/uL (ref 145–400)
RBC: 3.84 10*6/uL (ref 3.70–5.45)
RDW: 20 % — AB (ref 11.2–14.5)
WBC: 4.4 10*3/uL (ref 3.9–10.3)
lymph#: 1.2 10*3/uL (ref 0.9–3.3)

## 2016-07-12 NOTE — Progress Notes (Signed)
[facility].  Arrived by registration 45 minutes late to lab apt and then arrived to MD apt 36 minutes late.  Per Dr. Candise CheKale, MD unable to see pt due to tardiness.  This RN explained to pt and family that they need to come 30 minutes prior to apt time in order to get through registration in a timely manner.  Family unhappy MD will not be able to see pt today.  Informed pt's daughter Sherryl BartersRuth Mcclintock that RN will call with lab results and further instructions.  MD visit to be rescheduled accordingly.  Pt and family verbalized understanding.

## 2016-07-12 NOTE — Telephone Encounter (Signed)
Scheduled appt per sch message from Rn Loren - left message with appt date and time and sent reminder letter in the mail.  

## 2016-07-15 ENCOUNTER — Ambulatory Visit: Payer: Medicare Other | Admitting: Physical Therapy

## 2016-07-15 ENCOUNTER — Encounter (HOSPITAL_COMMUNITY): Payer: Self-pay | Admitting: Emergency Medicine

## 2016-07-15 ENCOUNTER — Telehealth: Payer: Self-pay | Admitting: *Deleted

## 2016-07-15 ENCOUNTER — Encounter: Payer: Self-pay | Admitting: Physical Therapy

## 2016-07-15 DIAGNOSIS — I2510 Atherosclerotic heart disease of native coronary artery without angina pectoris: Secondary | ICD-10-CM | POA: Diagnosis present

## 2016-07-15 DIAGNOSIS — E039 Hypothyroidism, unspecified: Secondary | ICD-10-CM | POA: Diagnosis not present

## 2016-07-15 DIAGNOSIS — R2689 Other abnormalities of gait and mobility: Secondary | ICD-10-CM

## 2016-07-15 DIAGNOSIS — L03116 Cellulitis of left lower limb: Principal | ICD-10-CM | POA: Diagnosis present

## 2016-07-15 DIAGNOSIS — E871 Hypo-osmolality and hyponatremia: Secondary | ICD-10-CM | POA: Diagnosis not present

## 2016-07-15 DIAGNOSIS — M6281 Muscle weakness (generalized): Secondary | ICD-10-CM

## 2016-07-15 DIAGNOSIS — D638 Anemia in other chronic diseases classified elsewhere: Secondary | ICD-10-CM | POA: Diagnosis present

## 2016-07-15 DIAGNOSIS — K219 Gastro-esophageal reflux disease without esophagitis: Secondary | ICD-10-CM | POA: Diagnosis present

## 2016-07-15 DIAGNOSIS — Z79899 Other long term (current) drug therapy: Secondary | ICD-10-CM

## 2016-07-15 DIAGNOSIS — R3129 Other microscopic hematuria: Secondary | ICD-10-CM | POA: Diagnosis not present

## 2016-07-15 DIAGNOSIS — M7120 Synovial cyst of popliteal space [Baker], unspecified knee: Secondary | ICD-10-CM | POA: Diagnosis present

## 2016-07-15 DIAGNOSIS — Z96652 Presence of left artificial knee joint: Secondary | ICD-10-CM | POA: Diagnosis present

## 2016-07-15 DIAGNOSIS — M810 Age-related osteoporosis without current pathological fracture: Secondary | ICD-10-CM | POA: Diagnosis present

## 2016-07-15 DIAGNOSIS — M79605 Pain in left leg: Secondary | ICD-10-CM | POA: Diagnosis not present

## 2016-07-15 DIAGNOSIS — R278 Other lack of coordination: Secondary | ICD-10-CM

## 2016-07-15 DIAGNOSIS — E785 Hyperlipidemia, unspecified: Secondary | ICD-10-CM | POA: Diagnosis present

## 2016-07-15 DIAGNOSIS — Z7982 Long term (current) use of aspirin: Secondary | ICD-10-CM

## 2016-07-15 LAB — I-STAT CG4 LACTIC ACID, ED: Lactic Acid, Venous: 1.61 mmol/L (ref 0.5–1.9)

## 2016-07-15 LAB — URINALYSIS, ROUTINE W REFLEX MICROSCOPIC
Bacteria, UA: NONE SEEN
Bilirubin Urine: NEGATIVE
GLUCOSE, UA: NEGATIVE mg/dL
KETONES UR: NEGATIVE mg/dL
LEUKOCYTES UA: NEGATIVE
Nitrite: NEGATIVE
PROTEIN: 100 mg/dL — AB
Specific Gravity, Urine: 1.013 (ref 1.005–1.030)
Squamous Epithelial / LPF: NONE SEEN
pH: 6 (ref 5.0–8.0)

## 2016-07-15 LAB — CBC
HCT: 30.1 % — ABNORMAL LOW (ref 36.0–46.0)
HEMOGLOBIN: 9.3 g/dL — AB (ref 12.0–15.0)
MCH: 24.3 pg — ABNORMAL LOW (ref 26.0–34.0)
MCHC: 30.9 g/dL (ref 30.0–36.0)
MCV: 78.6 fL (ref 78.0–100.0)
Platelets: 200 10*3/uL (ref 150–400)
RBC: 3.83 MIL/uL — AB (ref 3.87–5.11)
RDW: 19.5 % — ABNORMAL HIGH (ref 11.5–15.5)
WBC: 8.3 10*3/uL (ref 4.0–10.5)

## 2016-07-15 NOTE — ED Triage Notes (Signed)
Pt presents to ED for assessment of left lower leg pain and redness with some swelling starting yesterday.  Patient also experienced some chills today at home.  Pt has hx of cellulitis and admission for the same.  Pt's daughter states she took 1-300mg  Clindamycin prior to arrival this evening.

## 2016-07-15 NOTE — Telephone Encounter (Signed)
Call from pt's daughter requesting to reschedule office visit. She wants to be sure labs are back for visit. After discussion, lab appt moved to one day prior to office.  Message to MD to enter lab orders.

## 2016-07-15 NOTE — Therapy (Addendum)
[place], [place], [address] Phone: [phone]   Fax:  [phone]  Physical Therapy Treatment/Discharge Summary  Patient Details  Name: YANICE MAQUEDA MRN: [id number] Date of Birth: February 22, 1923 Referring Provider: Estill Dooms, MD  Encounter Date: 07/15/2016      PT End of Session - 07/15/16 1202    Visit Number 5   Number of Visits 9   Date for PT Re-Evaluation 07/26/16   Authorization Type MCR- KX at visit 15   PT Start Time 1202  pt arrived late   PT Stop Time 1239   PT Time Calculation (min) 37 min   Activity Tolerance Patient tolerated treatment well   Behavior During Therapy [facility] for tasks assessed/performed      Past Medical History:  Diagnosis Date  . Acute blood loss anemia   . Anginal pain [facility])    'i haven't had them in a long time but i used to'  . Arthritis    Osteoarthritis  . BBB (bundle branch block)    RT  . Coronary atherosclerosis of unspecified type of vessel, native or graft   . Debility, unspecified   . Diabetes mellitus without complication ([place])    Diet controlled  . Diaphragmatic hernia without mention of obstruction or gangrene   . Disturbance of skin sensation   . Edema   . Fibrocystic breast   . Gait disorder   . GERD (gastroesophageal reflux disease) 06/29/2011  . Hiatal hernia   . History of transfusion of packed red blood cells   . Hyperlipidemia   . Hypertension 06/29/11  . Hypothyroid 06/29/2011  . Insomnia   . OAB (overactive bladder)   . Osteoporosis   . Platelet disorder ([place])    ? Von Willebrand; abnormal PLT function assay 07/04/11   . Unspecified constipation   . Unspecified sleep apnea   . Unspecified urinary incontinence   . Unspecified vitamin D deficiency   . Unsteady gait   . Vertigo     Past Surgical History:  Procedure Laterality Date  . EYE SURGERY Right 2005   Cataract surgery  . EYE SURGERY Left 2006   Cataract surgery  .  FEMUR IM NAIL  06/29/2011   Procedure: INTRAMEDULLARY (IM) NAIL FEMORAL;  Surgeon: Augustin Schooling, MD;  Location: [place];  Service: Orthopedics;  Laterality: Left;  . HIP SURGERY Right 2002   ORIF  . ROTATOR CUFF REPAIR Left 1999   Tear  . THYROIDECTOMY  1952  . TONSILLECTOMY    . TOTAL KNEE ARTHROPLASTY Left 05/29/2015   Procedure: TOTAL KNEE ARTHROPLASTY;  Surgeon: Vickey Huger, MD;  Location: [place];  Service: Orthopedics;  Laterality: Left;    There were no vitals filed for this visit.      Subjective Assessment - 07/15/16 1208    Subjective pt reports she is not feeling well today, has chills. Caretaker reports pt is exhausted from being constantly going.    Currently in Pain? Yes   Pain Score --  4-6 on faces scale   Pain Location Arm   Pain Orientation Right   Pain Descriptors / Indicators Sore                         [facility] Adult PT Treatment/Exercise - 07/15/16 0001      Shoulder Exercises: Seated   Retraction 20 reps  seated retraction with GHJ extension   Protraction Limitations seated punch with reach  Horizontal ABduction 20 reps   Theraband Level (Shoulder Horizontal ABduction) Level 2 (Red)   Flexion Limitations seated punch   Abduction 15 reps   ABduction Weight (lbs) 1   Other Seated Exercises biceps curls 1# each hand   Other Seated Exercises seated GHJ flexion hands laced     Shoulder Exercises: Pulleys   Flexion 3 minutes                PT Education - 07/15/16 1248    Education provided Yes   Education Details seated posture, HEP, exercise form/rationale   Person(s) Educated Patient   Methods Explanation;Demonstration;Tactile cues;Verbal cues;Handout   Comprehension Verbalized understanding;Returned demonstration;Tactile cues required;Verbal cues required;Need further instruction          PT Short Term Goals - 06/26/16 1202      PT SHORT TERM GOAL #1   Title Pt will demo GHJ flexion ROM wtihin 5 deg of L arm to  improve functional reach by 5/25   Baseline see flowsheet   Time 4   Period Weeks   Status New     PT SHORT TERM GOAL #2   Title FOTO to 61% ability to indicate significant improvement in functional ability   Baseline 46% ability at eval   Time 4   Period Weeks   Status New     PT SHORT TERM GOAL #3   Title Pt will demo good upright posture while ambulating with walker for 16f to decrease overuse of GHJ during houshold ambulation   Baseline caregiver reports pt is flexed and pushes heavily through her arms to decrease weight in knee   Time 4   Period Weeks   Status New     PT SHORT TERM GOAL #4   Title Pt will be able to lift and move a 5lb weight using R UE without limitations by pain to improve functional daily lifting ability   Baseline unable at eval due to pain   Time 4   Period Weeks   Status New     PT SHORT TERM GOAL #5   Title Pt and caregiver with verbalize comfort and understanding with long term HEP for continued strengthening of GHJ.    Baseline will progress as tolerated and appropraite   Time 4   Period Weeks   Status New                  Plan - 07/15/16 1217    Clinical Impression Statement Cues for upright posture while seated in chair which pt reported improved shoulder comfort. Stool under feet and pillow placed behind back.  Pt denied any pain with exercises today but verbalized fatigue.    PT Next Visit Plan educate on proper use of walker & bed/chair mobility, GHJ AAROM, postural strength & periscapular endurance   Consulted and Agree with Plan of Care Patient;Family member/caregiver   Family Member Consulted caregiver      Patient will benefit from skilled therapeutic intervention in order to improve the following deficits and impairments:     Visit Diagnosis: Muscle weakness (generalized)  Other lack of coordination  Other abnormalities of gait and mobility     Problem List Patient Active Problem List   Diagnosis Date Noted   . Shoulder impingement syndrome, right 06/19/2016  . Anemia of chronic disease 06/18/2016  . Anemia of chronic kidney failure 06/18/2016  . Exposure to influenza 03/19/2016  . Memory deficit 03/19/2016  . CKD (chronic kidney disease) stage 3, GFR 30-59  ml/min 02/08/2016  . Cellulitis of left anterior lower leg 01/31/2016  . LLQ abdominal pain 12/06/2015  . Self-care deficit in patient living alone 11/08/2015  . Peripheral nerve contusion 08/15/2015  . Foot drop, left 07/18/2015  . S/P total knee arthroplasty 05/29/2015  . Bleeding disorder ([place]) 05/22/2015  . Primary osteoarthritis of both knees 05/04/2015  . Nonspecific vaginitis 04/11/2015  . Recurrent UTI 03/07/2015  . Joint mouse 03/07/2015  . Shoulder pain 01/18/2015  . Pre-operative cardiovascular examination 12/07/2014  . Loss of weight 10/13/2013  . Constipation 10/06/2013  . Venous insufficiency (chronic) (peripheral) 02/02/2013  . Knee pain 12/16/2012  . Arthritis of knee, degenerative 10/29/2012  . Edema 07/23/2012  . Abnormality of gait 07/23/2012  . Urinary incontinence 07/23/2012  . Hyperlipidemia 07/23/2012  . Controlled type 2 DM with peripheral circulatory disorder ([place]) 07/23/2012  . GERD (gastroesophageal reflux disease) 06/29/2011  . HTN (hypertension) 06/29/2011  . Intertrochanteric fracture of left femur ([place]) 06/29/2011  . Arthritis 06/29/2011  . Hypothyroid 06/29/2011    Verbena Boeding C. Jackie Russman PT, DPT 07/15/16 12:50 PM   [facility] [address] [place], [place], [address] Phone: [phone]   Fax:  [phone]  Name: KEYMIAH LYLES MRN: [id number] Date of Birth: [birth year]  PHYSICAL THERAPY DISCHARGE SUMMARY  Visits from Start of Care: 5  Current functional level related to goals / functional outcomes: See above   Remaining deficits: See above   Education / Equipment: Anatomy of condition, POC, HEP, exercise form/rationale   Plan: Patient agrees to discharge.  Patient goals were not met. Patient is being discharged due to a change in medical status.  ?????    Antrone Walla C. Sienna Stonehocker PT, DPT 08/07/16 1:38 PM

## 2016-07-16 ENCOUNTER — Inpatient Hospital Stay (HOSPITAL_COMMUNITY)
Admission: EM | Admit: 2016-07-16 | Discharge: 2016-07-19 | DRG: 603 | Disposition: A | Discharge status: Home / Self Care | Payer: Medicare Other | Attending: Family Medicine | Admitting: Family Medicine

## 2016-07-16 ENCOUNTER — Emergency Department (HOSPITAL_COMMUNITY): Payer: Medicare Other

## 2016-07-16 DIAGNOSIS — M79605 Pain in left leg: Secondary | ICD-10-CM | POA: Diagnosis not present

## 2016-07-16 DIAGNOSIS — E785 Hyperlipidemia, unspecified: Secondary | ICD-10-CM | POA: Diagnosis present

## 2016-07-16 DIAGNOSIS — L03116 Cellulitis of left lower limb: Secondary | ICD-10-CM | POA: Diagnosis not present

## 2016-07-16 DIAGNOSIS — M810 Age-related osteoporosis without current pathological fracture: Secondary | ICD-10-CM | POA: Diagnosis present

## 2016-07-16 DIAGNOSIS — Z96652 Presence of left artificial knee joint: Secondary | ICD-10-CM | POA: Diagnosis present

## 2016-07-16 DIAGNOSIS — E039 Hypothyroidism, unspecified: Secondary | ICD-10-CM | POA: Diagnosis present

## 2016-07-16 DIAGNOSIS — D638 Anemia in other chronic diseases classified elsewhere: Secondary | ICD-10-CM

## 2016-07-16 DIAGNOSIS — K219 Gastro-esophageal reflux disease without esophagitis: Secondary | ICD-10-CM | POA: Diagnosis present

## 2016-07-16 DIAGNOSIS — L03119 Cellulitis of unspecified part of limb: Secondary | ICD-10-CM | POA: Diagnosis present

## 2016-07-16 DIAGNOSIS — R3129 Other microscopic hematuria: Secondary | ICD-10-CM | POA: Diagnosis present

## 2016-07-16 DIAGNOSIS — M7989 Other specified soft tissue disorders: Secondary | ICD-10-CM | POA: Diagnosis not present

## 2016-07-16 DIAGNOSIS — M7120 Synovial cyst of popliteal space [Baker], unspecified knee: Secondary | ICD-10-CM | POA: Diagnosis present

## 2016-07-16 DIAGNOSIS — L0390 Cellulitis, unspecified: Secondary | ICD-10-CM | POA: Diagnosis present

## 2016-07-16 DIAGNOSIS — Z7982 Long term (current) use of aspirin: Secondary | ICD-10-CM | POA: Diagnosis not present

## 2016-07-16 DIAGNOSIS — Z79899 Other long term (current) drug therapy: Secondary | ICD-10-CM | POA: Diagnosis not present

## 2016-07-16 DIAGNOSIS — E871 Hypo-osmolality and hyponatremia: Secondary | ICD-10-CM | POA: Diagnosis present

## 2016-07-16 DIAGNOSIS — I2510 Atherosclerotic heart disease of native coronary artery without angina pectoris: Secondary | ICD-10-CM | POA: Diagnosis present

## 2016-07-16 LAB — COMPREHENSIVE METABOLIC PANEL
ALBUMIN: 2.9 g/dL — AB (ref 3.5–5.0)
ALK PHOS: 78 U/L (ref 38–126)
ALT: 14 U/L (ref 14–54)
AST: 25 U/L (ref 15–41)
Anion gap: 10 (ref 5–15)
BUN: 28 mg/dL — ABNORMAL HIGH (ref 6–20)
CHLORIDE: 99 mmol/L — AB (ref 101–111)
CO2: 23 mmol/L (ref 22–32)
CREATININE: 0.84 mg/dL (ref 0.44–1.00)
Calcium: 8.4 mg/dL — ABNORMAL LOW (ref 8.9–10.3)
GFR calc non Af Amer: 59 mL/min — ABNORMAL LOW (ref >60)
GLUCOSE: 131 mg/dL — AB (ref 65–99)
Potassium: 4 mmol/L (ref 3.5–5.1)
SODIUM: 132 mmol/L — AB (ref 135–145)
Total Bilirubin: 0.6 mg/dL (ref 0.3–1.2)
Total Protein: 7.7 g/dL (ref 6.5–8.1)

## 2016-07-16 LAB — I-STAT CG4 LACTIC ACID, ED: Lactic Acid, Venous: 0.92 mmol/L (ref 0.5–1.9)

## 2016-07-16 LAB — PROTIME-INR
INR: 1.19
Prothrombin Time: 15.1 seconds (ref 11.4–15.2)

## 2016-07-16 MED ORDER — VANCOMYCIN HCL IN DEXTROSE 750-5 MG/150ML-% IV SOLN
750.0000 mg | INTRAVENOUS | Status: DC
  Administered 2016-07-16 – 2016-07-17 (×2): 750 mg via INTRAVENOUS
  Filled 2016-07-16 (×2): qty 150

## 2016-07-16 MED ORDER — ENOXAPARIN SODIUM 40 MG/0.4ML ~~LOC~~ SOLN
40.0000 mg | Freq: Every day | SUBCUTANEOUS | Status: DC
  Administered 2016-07-16 – 2016-07-19 (×4): 40 mg via SUBCUTANEOUS
  Filled 2016-07-16 (×4): qty 0.4

## 2016-07-16 MED ORDER — ADULT MULTIVITAMIN W/MINERALS CH
1.0000 | ORAL_TABLET | Freq: Every day | ORAL | Status: DC
  Administered 2016-07-16 – 2016-07-19 (×4): 1 via ORAL
  Filled 2016-07-16 (×4): qty 1

## 2016-07-16 MED ORDER — VANCOMYCIN HCL IN DEXTROSE 1-5 GM/200ML-% IV SOLN
1000.0000 mg | Freq: Once | INTRAVENOUS | Status: AC
  Administered 2016-07-16: 1000 mg via INTRAVENOUS
  Filled 2016-07-16: qty 200

## 2016-07-16 MED ORDER — LEVOTHYROXINE SODIUM 25 MCG PO TABS
25.0000 ug | ORAL_TABLET | Freq: Every day | ORAL | Status: DC
  Administered 2016-07-16 – 2016-07-18 (×3): 25 ug via ORAL
  Filled 2016-07-16 (×3): qty 1

## 2016-07-16 MED ORDER — CELECOXIB 200 MG PO CAPS
200.0000 mg | ORAL_CAPSULE | Freq: Two times a day (BID) | ORAL | Status: DC
  Administered 2016-07-16 – 2016-07-19 (×7): 200 mg via ORAL
  Filled 2016-07-16 (×7): qty 1

## 2016-07-16 MED ORDER — ACETAMINOPHEN 325 MG PO TABS: 650.0000 mg | ORAL_TABLET | ORAL | Status: DC | PRN

## 2016-07-16 MED ORDER — FOLIC ACID 1 MG PO TABS
1.0000 mg | ORAL_TABLET | Freq: Every day | ORAL | Status: DC
  Administered 2016-07-16 – 2016-07-19 (×4): 1 mg via ORAL
  Filled 2016-07-16 (×4): qty 1

## 2016-07-16 MED ORDER — FERROUS SULFATE 325 (65 FE) MG PO TABS
325.0000 mg | ORAL_TABLET | Freq: Three times a day (TID) | ORAL | Status: DC
Start: 2016-07-16 — End: 2016-07-19
  Administered 2016-07-16 – 2016-07-19 (×11): 325 mg via ORAL
  Filled 2016-07-16 (×10): qty 1

## 2016-07-16 MED ORDER — ASPIRIN EC 81 MG PO TBEC
81.0000 mg | DELAYED_RELEASE_TABLET | Freq: Every day | ORAL | Status: DC
  Administered 2016-07-16 – 2016-07-19 (×4): 81 mg via ORAL
  Filled 2016-07-16 (×4): qty 1

## 2016-07-16 MED ORDER — FUROSEMIDE 40 MG PO TABS
40.0000 mg | ORAL_TABLET | Freq: Every day | ORAL | Status: DC
  Administered 2016-07-16 – 2016-07-19 (×4): 40 mg via ORAL
  Filled 2016-07-16 (×4): qty 1

## 2016-07-16 NOTE — ED Notes (Signed)
ED Provider at bedside. 

## 2016-07-16 NOTE — H&P (Signed)
History and Physical    Jessica Hawkins ZOX:[id number] DOB: [birth year] DOA: 07/16/2016  PCP: Kimber Relic, MD  Patient coming from: Home  I have personally briefly reviewed patient's old medical records in [facility]  Chief Complaint: LLE cellulitis  HPI: Jessica Hawkins is a 83 y.o. female with medical history significant of recurrent cellulitis to LLE that has occurred since she got a TKA in March of last year.  Most recently was admitted to hospital in Nov after failing outpatient course of doxycycline.  During that admit she also failed Ancef inpatient and ultimately was treated with zosyn and vanc which worked to improve cellulitis before being discharged on clindamycin (which also worked per family).  Symptoms this time onset yesterday around 5pm, Tm 102 at home (100.2 in ED), associated chills.  Took a left over 300mg  PO clindamycin she had at home.  Presented to ED for symptoms.   ED Course: Put on vancomycin by EDP   Review of Systems: As per HPI otherwise 10 point review of systems negative.   Past Medical History:  Diagnosis Date  . Acute blood loss anemia   . Anginal pain [facility])    'i haven't had them in a long time but i used to'  . Arthritis    Osteoarthritis  . BBB (bundle branch block)    RT  . Coronary atherosclerosis of unspecified type of vessel, native or graft   . Debility, unspecified   . Diabetes mellitus without complication ([facility])    Diet controlled  . Diaphragmatic hernia without mention of obstruction or gangrene   . Disturbance of skin sensation   . Edema   . Fibrocystic breast   . Gait disorder   . GERD (gastroesophageal reflux disease) 06/29/2011  . Hiatal hernia   . History of transfusion of packed red blood cells   . Hyperlipidemia   . Hypertension 06/29/11  . Hypothyroid 06/29/2011  . Insomnia   . OAB (overactive bladder)   . Osteoporosis   . Platelet disorder ([facility])    ? Von Willebrand; abnormal PLT function assay 07/04/11   .  Unspecified constipation   . Unspecified sleep apnea   . Unspecified urinary incontinence   . Unspecified vitamin D deficiency   . Unsteady gait   . Vertigo     Past Surgical History:  Procedure Laterality Date  . EYE SURGERY Right 2005   Cataract surgery  . EYE SURGERY Left 2006   Cataract surgery  . FEMUR IM NAIL  06/29/2011   Procedure: INTRAMEDULLARY (IM) NAIL FEMORAL;  Surgeon: Verlee Rossetti, MD;  [facility];  Service: Orthopedics;  Laterality: Left;  . HIP SURGERY Right 2002   ORIF  . ROTATOR CUFF REPAIR Left 1999   Tear  . THYROIDECTOMY  1952  . TONSILLECTOMY    . TOTAL KNEE ARTHROPLASTY Left 05/29/2015   Procedure: TOTAL KNEE ARTHROPLASTY;  Surgeon: Dannielle Huh, MD;  Location: [facility] OR;  Service: Orthopedics;  Laterality: Left;     reports that she has never smoked. She has never used smokeless tobacco. She reports that she does not drink alcohol or use drugs.  No Known Allergies  Family History  Problem Relation Age of Onset  . Cancer Mother        Stomach  . Cancer Father        Prostate  . Emphysema Father   . Alcohol abuse Sister   . Liver disease Sister   . Kidney disease Brother   .  Hypertension Daughter   . Cancer Brother        Prostate, Bladder  . Emphysema Brother   . Cancer Brother        Prostate  . Arthritis Daughter        Knees  . Heart murmur Son   . Mental illness Son        Autism     Prior to Admission medications   Medication Sig Start Date End Date Taking? Authorizing Provider  aspirin EC 81 MG tablet Take 81 mg by mouth daily.    [provider]  celecoxib (CELEBREX) 200 MG capsule Take 1 capsule (200 mg total) by mouth 2 (two) times daily. 06/19/16   Kimber RelicGreen, Arthur G, MD  Emollient (UDDERLY SMOOTH) CREA Apply 1 application topically 2 (two) times daily as needed (for dry skin related to healing cellulitis).    [provider]  ferrous sulfate 325 (65 FE) MG tablet Take 1 tablet (325 mg total) by mouth 3  (three) times daily with meals. 02/08/16   Rolly SalterPatel, Pranav M, MD  fluconazole (DIFLUCAN) 100 MG tablet Take 1 tablet (100 mg total) by mouth daily. Take for 3 days Patient not taking: Reported on 06/26/2016 06/24/16   Kimber RelicGreen, Arthur G, MD  folic acid (FOLVITE) 1 MG tablet Take 1 tablet (1 mg total) by mouth daily. 04/03/16   Kimber RelicGreen, Arthur G, MD  furosemide (LASIX) 40 MG tablet Take one tablet by mouth once daily for edema 04/03/16   Kimber RelicGreen, Arthur G, MD  glucose blood test strip One Touch Verio Test Strips. Use to test blood sugar once daily. Dx:E11.9 05/01/16   Kimber RelicGreen, Arthur G, MD  Lancets [facility](ONETOUCH ULTRASOFT) lancets Use to test blood sugar once daily. Dx: E11.9 05/01/16   Kimber RelicGreen, Arthur G, MD  levothyroxine (SYNTHROID, LEVOTHROID) 25 MCG tablet take 1 tablet by mouth once daily for THYROID SUPPLEMENT 07/11/16   Kimber RelicGreen, Arthur G, MD  lidocaine (ASPERCREME W/LIDOCAINE) 4 % cream Apply 4 times daily to right shoulder 06/19/16   Kimber RelicGreen, Arthur G, MD  Multiple Vitamin (MULTIVITAMIN WITH MINERALS) TABS tablet Take 1 tablet by mouth daily. Centrum    [provider]    Physical Exam: Vitals:   07/15/16 2247 07/16/16 0300 07/16/16 0400  BP: (!) 147/64 (!) 134/48 (!) 123/47  Pulse: 73 66 69  Resp: 16    Temp: 100.2 F (37.9 C)    TempSrc: Oral    SpO2: 97% 98% 97%    Constitutional: NAD, calm, comfortable Eyes: PERRL, lids and conjunctivae normal ENMT: Mucous membranes are moist. Posterior pharynx clear of any exudate or lesions.Normal dentition.  Neck: normal, supple, no masses, no thyromegaly Respiratory: clear to auscultation bilaterally, no wheezing, no crackles. Normal respiratory effort. No accessory muscle use.  Cardiovascular: Regular rate and rhythm, no murmurs / rubs / gallops. No extremity edema. 2+ pedal pulses. No carotid bruits.  Abdomen: no tenderness, no masses palpated. No hepatosplenomegaly. Bowel sounds positive.  Musculoskeletal: no clubbing / cyanosis. No joint deformity upper  and lower extremities. Good ROM, no contractures. Normal muscle tone.  Skin:  Neurologic: CN 2-12 grossly intact. Sensation intact, DTR normal. Strength 5/5 in all 4.  Psychiatric: Normal judgment and insight. Alert and oriented x 3. Normal mood.    Labs on Admission: I have personally reviewed following labs and imaging studies  CBC:  Recent Labs Lab 07/12/16 1044 07/15/16 2314  WBC 4.4 8.3  NEUTROABS 2.6  --   HGB 9.4* 9.3*  HCT 29.9*  30.1*  MCV 77.7* 78.6  PLT 220 200   Basic Metabolic Panel:  Recent Labs Lab 07/15/16 2314  NA 132*  K 4.0  CL 99*  CO2 23  GLUCOSE 131*  BUN 28*  CREATININE 0.84  CALCIUM 8.4*   GFR: CrCl cannot be calculated (Unknown ideal weight.). Liver Function Tests:  Recent Labs Lab 07/15/16 2314  AST 25  ALT 14  ALKPHOS 78  BILITOT 0.6  PROT 7.7  ALBUMIN 2.9*   No results for input(s): LIPASE, AMYLASE in the last 168 hours. No results for input(s): AMMONIA in the last 168 hours. Coagulation Profile:  Recent Labs Lab 07/15/16 2314  INR 1.19   Cardiac Enzymes: No results for input(s): CKTOTAL, CKMB, CKMBINDEX, TROPONINI in the last 168 hours. BNP (last 3 results) No results for input(s): PROBNP in the last 8760 hours. HbA1C: No results for input(s): HGBA1C in the last 72 hours. CBG: No results for input(s): GLUCAP in the last 168 hours. Lipid Profile: No results for input(s): CHOL, HDL, LDLCALC, TRIG, CHOLHDL, LDLDIRECT in the last 72 hours. Thyroid Function Tests: No results for input(s): TSH, T4TOTAL, FREET4, T3FREE, THYROIDAB in the last 72 hours. Anemia Panel: No results for input(s): VITAMINB12, FOLATE, FERRITIN, TIBC, IRON, RETICCTPCT in the last 72 hours. Urine analysis:    Component Value Date/Time   COLORURINE YELLOW 07/15/2016 2341   APPEARANCEUR CLEAR 07/15/2016 2341   LABSPEC 1.013 07/15/2016 2341   PHURINE 6.0 07/15/2016 2341   GLUCOSEU NEGATIVE 07/15/2016 2341   HGBUR MODERATE (A) 07/15/2016 2341    BILIRUBINUR NEGATIVE 07/15/2016 2341   BILIRUBINUR Pos 02/24/2015 1151   KETONESUR NEGATIVE 07/15/2016 2341   PROTEINUR 100 (A) 07/15/2016 2341   UROBILINOGEN 0.2 02/24/2015 1151   UROBILINOGEN 1.0 08/25/2012 1929   NITRITE NEGATIVE 07/15/2016 2341   LEUKOCYTESUR NEGATIVE 07/15/2016 2341    Radiological Exams on Admission: Dg Tibia/fibula Left  Result Date: 07/16/2016 CLINICAL DATA:  Cellulitis and leg pain EXAM: LEFT TIBIA AND FIBULA - 2 VIEW COMPARISON:  Left knee radiograph 02/07/2016 FINDINGS: There is a left total knee arthroplasty and an incompletely visualized antegrade intramedullary femoral nail. There is no abnormal lucency surrounding any of the hardware. The bones remain osteopenic. There is no soft tissue emphysema. No fracture or dislocation. IMPRESSION: No acute findings of the right tibia and fibula. No hardware adverse features. Electronically Signed   By: Deatra Robinson M.D.   On: 07/16/2016 00:35    EKG: Independently reviewed.  Assessment/Plan Principal Problem:   Cellulitis of left anterior lower leg Active Problems:   Anemia of chronic disease    1. LLE cellulitis - 1. Will continue empiric vanc since that worked last time in Nov (outpatient Doxy and then inpatient Ancef failed in Nov). 2. Daily BMP while on vanc 3. Tylenol PRN fever 2. Anemia - chronic and stable, sees heme for this and is getting IV iron infusions this month.  DVT prophylaxis: Lovenox Code Status: Full Family Communication: Family at bedside Disposition Plan: Home after admit Consults called: None Admission status: Place in [place], [place] Iles. DO [facility] Pager [phone]  If 7AM-7PM, please contact day team taking care of patient [email or web address] Password [facility]  07/16/2016, 4:37 AM

## 2016-07-16 NOTE — Progress Notes (Signed)
Pharmacy Antibiotic Note  Gerhard Munchauline G Belcourt is a 83 y.o. female admitted on 07/16/2016 with cellulitis.  Pharmacy has been consulted for Vancomycin dosing. WBC WNL. Renal function age appropriate.   Plan: Vancomycin 750 mg IV q24h Trend WBC, temp, renal function  F/U infectious work-up Drug levels as indicated    Temp (24hrs), Avg:100.2 F (37.9 C), Min:100.2 F (37.9 C), Max:100.2 F (37.9 C)  Pt weight: ~62 kg   Recent Labs Lab 07/12/16 1044 07/15/16 2314 07/15/16 2354 07/16/16 0247  WBC 4.4 8.3  --   --   CREATININE  --  0.84  --   --   LATICACIDVEN  --   --  1.61 0.92    CrCl cannot be calculated (Unknown ideal weight.).    No Known Allergies  Abran DukeLedford, Yuan Gann 07/16/2016 4:42 AM

## 2016-07-16 NOTE — Plan of Care (Signed)
Problem: Safety: Goal: Ability to remain free from injury will improve Outcome: Progressing Patient oriented to room and call light. Patient able to express needs. Patient remains free of injury this shift. Will continue with care  Problem: Pain Managment: Goal: General experience of comfort will improve Outcome: Progressing Patient changed into hospital gown, toileted, diaper changed, peri-care provided and made comfortable in bed.  Problem: Skin Integrity: Goal: Risk for impaired skin integrity will decrease Outcome: Progressing Patient has cellulitis to left leg. Redness and nonpitting edema noticed. Will continue to monitor

## 2016-07-16 NOTE — Progress Notes (Signed)
Patient seen and examined, admitted by Dr. Julian ReilGardner for recurrent left lower extremity cellulitis.  BP (!) 124/43 (BP Location: Right Arm)   Pulse 63   Temp 99 F (37.2 C) (Oral)   Resp 16   Ht 5\' 3"  (1.6 m)   Wt 60.3 kg (133 lb)   SpO2 97%   BMI 23.56 kg/m    A/p Agree with assessment and plan, will continue empiric vancomycin for cellulitis Elevate left lower extremity Obtain Doppler ultrasound left lower extremity to rule out DVT  Will follow, discussed the plan in detail with patient's daughter and caregiver at bedside   Ripudeep Rai M.D. [facility] 07/16/2016, 10:30 AM  Pager: [phone]

## 2016-07-16 NOTE — ED Provider Notes (Signed)
[facility]-EMERGENCY DEPT Provider Note   CSN: [id number] Arrival date & time: 07/15/16  2234 By signing my name below, I, Bridgette Habermann, attest that this documentation has been prepared under the direction and in the presence of Zadie Rhine, MD. Electronically Signed: Bridgette Habermann, ED Scribe. 07/16/16. 2:20 AM.  History   Chief Complaint Chief Complaint  Patient presents with  . Leg Pain    HPI The history is provided by the patient and a relative. No language interpreter was used.   HPI Comments: Jessica Hawkins is a 83 y.o. female with h/o anemia, DM, HTN, HLD, and CKD, who presents to the Emergency Department complaining of left lower extremity pain with redness and mild swelling beginning yesterday around 5pm. Pt also has associated fever (Tmax 102) and chills. Daughter at bedside reports that pt took one 300mg  Clindamycin around 6:30pm yesterday with moderate relief. Pt notes she has h/o previous surgery and cellulitis to the same area numerous times and had to be admitted for this in December 2017.   Pt is also complaining of abdominal pain that began last night. Daughter notes that pt has not ate or drank anything in several hours and states this may be why she is experiencing her pain. Pt has h/o stomach ulcers and is concerned for this. Pt denies vomiting.  Past Medical History:  Diagnosis Date  . Acute blood loss anemia   . Anginal pain [facility])    'i haven't had them in a long time but i used to'  . Arthritis    Osteoarthritis  . BBB (bundle branch block)    RT  . Coronary atherosclerosis of unspecified type of vessel, native or graft   . Debility, unspecified   . Diabetes mellitus without complication (HCC)    Diet controlled  . Diaphragmatic hernia without mention of obstruction or gangrene   . Disturbance of skin sensation   . Edema   . Fibrocystic breast   . Gait disorder   . GERD (gastroesophageal reflux disease) 06/29/2011  . Hiatal hernia   . History of transfusion of  packed red blood cells   . Hyperlipidemia   . Hypertension 06/29/11  . Hypothyroid 06/29/2011  . Insomnia   . OAB (overactive bladder)   . Osteoporosis   . Platelet disorder (HCC)    ? Von Willebrand; abnormal PLT function assay 07/04/11   . Unspecified constipation   . Unspecified sleep apnea   . Unspecified urinary incontinence   . Unspecified vitamin D deficiency   . Unsteady gait   . Vertigo     Patient Active Problem List   Diagnosis Date Noted  . Shoulder impingement syndrome, right 06/19/2016  . Anemia of chronic disease 06/18/2016  . Anemia of chronic kidney failure 06/18/2016  . Exposure to influenza 03/19/2016  . Memory deficit 03/19/2016  . CKD (chronic kidney disease) stage 3, GFR 30-59 ml/min 02/08/2016  . Cellulitis of left anterior lower leg 01/31/2016  . LLQ abdominal pain 12/06/2015  . Self-care deficit in patient living alone 11/08/2015  . Peripheral nerve contusion 08/15/2015  . Foot drop, left 07/18/2015  . S/P total knee arthroplasty 05/29/2015  . Bleeding disorder (HCC) 05/22/2015  . Primary osteoarthritis of both knees 05/04/2015  . Nonspecific vaginitis 04/11/2015  . Recurrent UTI 03/07/2015  . Joint mouse 03/07/2015  . Shoulder pain 01/18/2015  . Pre-operative cardiovascular examination 12/07/2014  . Loss of weight 10/13/2013  . Constipation 10/06/2013  . Venous insufficiency (chronic) (peripheral) 02/02/2013  . Knee pain  12/16/2012  . Arthritis of knee, degenerative 10/29/2012  . Edema 07/23/2012  . Abnormality of gait 07/23/2012  . Urinary incontinence 07/23/2012  . Hyperlipidemia 07/23/2012  . Controlled type 2 DM with peripheral circulatory disorder (HCC) 07/23/2012  . GERD (gastroesophageal reflux disease) 06/29/2011  . HTN (hypertension) 06/29/2011  . Intertrochanteric fracture of left femur (HCC) 06/29/2011  . Arthritis 06/29/2011  . Hypothyroid 06/29/2011    Past Surgical History:  Procedure Laterality Date  . EYE SURGERY Right  2005   Cataract surgery  . EYE SURGERY Left 2006   Cataract surgery  . FEMUR IM NAIL  06/29/2011   Procedure: INTRAMEDULLARY (IM) NAIL FEMORAL;  Surgeon: Verlee Rossetti, MD;  Location: [facility];  Service: Orthopedics;  Laterality: Left;  . HIP SURGERY Right 2002   ORIF  . ROTATOR CUFF REPAIR Left 1999   Tear  . THYROIDECTOMY  1952  . TONSILLECTOMY    . TOTAL KNEE ARTHROPLASTY Left 05/29/2015   Procedure: TOTAL KNEE ARTHROPLASTY;  Surgeon: Dannielle Huh, MD;  Location: [facility] OR;  Service: Orthopedics;  Laterality: Left;    OB History    No data available       Home Medications    Prior to Admission medications   Medication Sig Start Date End Date Taking? Authorizing Provider  aspirin EC 81 MG tablet Take 81 mg by mouth daily.    [provider]  celecoxib (CELEBREX) 200 MG capsule Take 1 capsule (200 mg total) by mouth 2 (two) times daily. 06/19/16   Kimber Relic, MD  Emollient (UDDERLY SMOOTH) CREA Apply 1 application topically 2 (two) times daily as needed (for dry skin related to healing cellulitis).    [provider]  ferrous sulfate 325 (65 FE) MG tablet Take 1 tablet (325 mg total) by mouth 3 (three) times daily with meals. 02/08/16   Rolly Salter, MD  fluconazole (DIFLUCAN) 100 MG tablet Take 1 tablet (100 mg total) by mouth daily. Take for 3 days Patient not taking: Reported on 06/26/2016 06/24/16   Kimber Relic, MD  folic acid (FOLVITE) 1 MG tablet Take 1 tablet (1 mg total) by mouth daily. 04/03/16   Kimber Relic, MD  furosemide (LASIX) 40 MG tablet Take one tablet by mouth once daily for edema 04/03/16   Kimber Relic, MD  glucose blood test strip One Touch Verio Test Strips. Use to test blood sugar once daily. Dx:E11.9 05/01/16   Kimber Relic, MD  Lancets [place] ULTRASOFT) lancets Use to test blood sugar once daily. Dx: E11.9 05/01/16   Kimber Relic, MD  levothyroxine (SYNTHROID, LEVOTHROID) 25 MCG tablet take 1 tablet by mouth once daily for  THYROID SUPPLEMENT 07/11/16   Kimber Relic, MD  lidocaine (ASPERCREME W/LIDOCAINE) 4 % cream Apply 4 times daily to right shoulder 06/19/16   Kimber Relic, MD  Multiple Vitamin (MULTIVITAMIN WITH MINERALS) TABS tablet Take 1 tablet by mouth daily. Centrum    [provider]    Family History Family History  Problem Relation Age of Onset  . Cancer Mother        Stomach  . Cancer Father        Prostate  . Emphysema Father   . Alcohol abuse Sister   . Liver disease Sister   . Kidney disease Brother   . Hypertension Daughter   . Cancer Brother        Prostate, Bladder  . Emphysema Brother   . Cancer Brother  Prostate  . Arthritis Daughter        Knees  . Heart murmur Son   . Mental illness Son        Autism    Social History Social History  Substance Use Topics  . Smoking status: Never Smoker  . Smokeless tobacco: Never Used  . Alcohol use No     Allergies   Patient has no known allergies.   Review of Systems Review of Systems  Constitutional: Positive for chills and fever.  Cardiovascular: Positive for leg swelling.  Gastrointestinal: Positive for abdominal pain. Negative for vomiting.  Musculoskeletal: Positive for arthralgias and myalgias.  Skin: Positive for color change.  All other systems reviewed and are negative.  Physical Exam Updated Vital Signs BP (!) 147/64 (BP Location: Left Arm)   Pulse 73   Temp 100.2 F (37.9 C) (Oral)   Resp 16   SpO2 97%   Physical Exam CONSTITUTIONAL: Elderly and frail, no acute distress, she is hard of hearing HEAD: Normocephalic/atraumatic EYES: EOMI ENMT: Mucous membranes moist NECK: supple no meningeal signs SPINE/BACK:entire spine nontender CV: S1/S2 noted, no murmurs/rubs/gallops noted LUNGS: Lungs are clear to auscultation bilaterally, no apparent distress ABDOMEN: soft, nontender, no rebound or guarding, bowel sounds noted throughout abdomen GU:no cva tenderness NEURO: Pt is  awake/alert/appropriate, moves all extremitiesx4.  No facial droop.   EXTREMITIES: pulses normal/equal, full ROM, no crepitus, no abscess noted to legs.  Distal pulses intact SKIN: warm, color normal PSYCH: no abnormalities of mood noted, alert and oriented to situation      ED Treatments / Results  DIAGNOSTIC STUDIES: Oxygen Saturation is 97% on RA, adequate by my interpretation.   COORDINATION OF CARE: 2:20 AM-Discussed next steps with pt. Pt verbalized understanding and is agreeable with the plan.   Labs (all labs ordered are listed, but only abnormal results are displayed) Labs Reviewed  CBC - Abnormal; Notable for the following:       Result Value   RBC 3.83 (*)    Hemoglobin 9.3 (*)    HCT 30.1 (*)    MCH 24.3 (*)    RDW 19.5 (*)    All other components within normal limits  COMPREHENSIVE METABOLIC PANEL - Abnormal; Notable for the following:    Sodium 132 (*)    Chloride 99 (*)    Glucose, Bld 131 (*)    BUN 28 (*)    Calcium 8.4 (*)    Albumin 2.9 (*)    GFR calc non Af Amer 59 (*)    All other components within normal limits  URINALYSIS, ROUTINE W REFLEX MICROSCOPIC - Abnormal; Notable for the following:    Hgb urine dipstick MODERATE (*)    Protein, ur 100 (*)    All other components within normal limits  CULTURE, BLOOD (ROUTINE X 2)  CULTURE, BLOOD (ROUTINE X 2)  PROTIME-INR  I-STAT CG4 LACTIC ACID, ED  I-STAT CG4 LACTIC ACID, ED    EKG  EKG Interpretation None       Radiology Dg Tibia/fibula Left  Result Date: 07/16/2016 CLINICAL DATA:  Cellulitis and leg pain EXAM: LEFT TIBIA AND FIBULA - 2 VIEW COMPARISON:  Left knee radiograph 02/07/2016 FINDINGS: There is a left total knee arthroplasty and an incompletely visualized antegrade intramedullary femoral nail. There is no abnormal lucency surrounding any of the hardware. The bones remain osteopenic. There is no soft tissue emphysema. No fracture or dislocation. IMPRESSION: No acute findings of the  right tibia and fibula. No hardware  adverse features. Electronically Signed   By: Deatra Robinson M.D.   On: 07/16/2016 00:35    Procedures Procedures    Medications Ordered in ED Medications  vancomycin (VANCOCIN) IVPB 1000 mg/200 mL premix (1,000 mg Intravenous New Bag/Given 07/16/16 0233)     Initial Impression / Assessment and Plan / ED Course  I have reviewed the triage vital signs and the nursing notes.  Pertinent labs & imaging results that were available during my care of the patient were reviewed by me and considered in my medical decision making (see chart for details).     3:31 AM Pt in the ED for recurrent cellulitis to left LE She has had this previously Pt with high likelihood will have failure of outpatient therapy given age/history/debility Will admit D/w dr Julian Reil for admission Patient agreeable Pt is not currently septic No abscess/crepitus noted on left LE exam Doubt DVT given history/exam and previous h/o similar symptoms  No focal ABD Tenderness on my eval Defer further workup for now   Final Clinical Impressions(s) / ED Diagnoses   Final diagnoses:  Cellulitis of left lower extremity    New Prescriptions New Prescriptions   No medications on file   I personally performed the services described in this documentation, which was scribed in my presence. The recorded information has been reviewed and is accurate.        Zadie Rhine, MD 07/16/16 [phone]

## 2016-07-16 NOTE — Care Management Note (Signed)
Case Management Note  Patient Details  Name: Gerhard Munchauline G Wilfong MRN: [id number] Date of Birth: 1923-02-13  Subjective/Objective:     Pt in with LLE cellulitis. She is from home with family and has private aide.               Action/Plan: Plan currently is to return home when medically stable. CM following for d/c needs, physician orders.   Expected Discharge Date:                  Expected Discharge Plan:  Home/Self Care  In-House Referral:     Discharge planning Services     Post Acute Care Choice:    Choice offered to:     DME Arranged:    DME Agency:     HH Arranged:    HH Agency:     Status of Service:  In process, will continue to follow  If discussed at Long Length of Stay Meetings, dates discussed:    Additional Comments:  Kermit BaloKelli F Emylie Amster, RN 07/16/2016, 11:14 AM

## 2016-07-16 NOTE — Progress Notes (Signed)
Patient admitted to 5M03 from ER, status post left leg pain and cellulitis. Patient A&OX4. Patient oriented to room environment and call light. Patient made comfortable, daughters at bedside, patient denies pain and discomfort at this time. No s/s of sepsis this far. VSS. Will continue with care.

## 2016-07-17 ENCOUNTER — Ambulatory Visit: Payer: Medicare Other | Admitting: Physical Therapy

## 2016-07-17 ENCOUNTER — Ambulatory Visit: Payer: Medicare Other | Admitting: Podiatry

## 2016-07-17 ENCOUNTER — Encounter: Payer: Self-pay | Admitting: Podiatry

## 2016-07-17 ENCOUNTER — Inpatient Hospital Stay (HOSPITAL_COMMUNITY): Payer: Medicare Other

## 2016-07-17 DIAGNOSIS — M7989 Other specified soft tissue disorders: Secondary | ICD-10-CM

## 2016-07-17 LAB — BASIC METABOLIC PANEL
ANION GAP: 6 (ref 5–15)
BUN: 20 mg/dL (ref 6–20)
CHLORIDE: 105 mmol/L (ref 101–111)
CO2: 24 mmol/L (ref 22–32)
Calcium: 8.1 mg/dL — ABNORMAL LOW (ref 8.9–10.3)
Creatinine, Ser: 0.8 mg/dL (ref 0.44–1.00)
GFR calc Af Amer: >60 mL/min (ref >60)
GFR calc non Af Amer: >60 mL/min (ref >60)
Glucose, Bld: 102 mg/dL — ABNORMAL HIGH (ref 65–99)
Potassium: 3.9 mmol/L (ref 3.5–5.1)
Sodium: 135 mmol/L (ref 135–145)

## 2016-07-17 LAB — SEDIMENTATION RATE: SED RATE: 71 mm/h — AB (ref 0–22)

## 2016-07-17 LAB — CBC
HEMATOCRIT: 27.8 % — AB (ref 36.0–46.0)
HEMOGLOBIN: 8.7 g/dL — AB (ref 12.0–15.0)
MCH: 25 pg — AB (ref 26.0–34.0)
MCHC: 31.3 g/dL (ref 30.0–36.0)
MCV: 79.9 fL (ref 78.0–100.0)
Platelets: 161 10*3/uL (ref 150–400)
RBC: 3.48 MIL/uL — AB (ref 3.87–5.11)
RDW: 20.3 % — ABNORMAL HIGH (ref 11.5–15.5)
WBC: 4.5 10*3/uL (ref 4.0–10.5)

## 2016-07-17 LAB — C-REACTIVE PROTEIN: CRP: 5.7 mg/dL — ABNORMAL HIGH (ref <1.0)

## 2016-07-17 NOTE — Progress Notes (Signed)
*  PRELIMINARY RESULTS* Vascular Ultrasound Left lower extremity venous duplex has been completed.  Preliminary findings: No evidence of DVT. An area of mixed echoes noted in the left popliteal fossa, most likely a complex baker's cyst.    Farrel DemarkJill Eunice, RDMS, RVT  07/17/2016, 8:43 AM

## 2016-07-17 NOTE — Progress Notes (Signed)
NO SHOW

## 2016-07-17 NOTE — Evaluation (Signed)
Physical Therapy Evaluation Patient Details Name: Jessica Hawkins MRN: [id number] DOB: 12/24/1922 Today's Date: 07/17/2016   History of Present Illness  Pt is a 83 y/o female who presents with recurring LLE cellulitis. She has been having instances of this since L TKA in March of last year per chart review.   Clinical Impression  Pt admitted with above diagnosis. Pt currently with functional limitations due to the deficits listed below (see PT Problem List). At the time of PT eval pt was able to perform transfers and ambulation with gross min guard assistance for safety. Pt is interested in HHPT to improve overall function and tolerance for functional activity, however wants to know how much it would cost her before she is set up with a company. Pt will benefit from skilled PT to increase their independence and safety with mobility to allow discharge to the venue listed below.       Follow Up Recommendations Home health PT;Supervision for mobility/OOB    Equipment Recommendations  None recommended by PT    Recommendations for Other Services       Precautions / Restrictions Precautions Precautions: Fall Restrictions Weight Bearing Restrictions: No      Mobility  Bed Mobility Overal bed mobility: Needs Assistance Bed Mobility: Supine to Sit     Supine to sit: Supervision     General bed mobility comments: Supervision and light cues for sequencing and safety as pt transitioned to EOB.   Transfers Overall transfer level: Needs assistance Equipment used: Rolling walker (2 wheeled) Transfers: Sit to/from Stand Sit to Stand: Min guard         General transfer comment: Hands-on guarding as pt powered-up to full standing position. Pt required increased time to complete full stand.   Ambulation/Gait Ambulation/Gait assistance: Min guard Ambulation Distance (Feet): 100 Feet Assistive device: Rolling walker (2 wheeled) Gait Pattern/deviations: Step-through pattern;Decreased  stride length;Trunk flexed Gait velocity: Decreased Gait velocity interpretation: Below normal speed for age/gender General Gait Details: Slow but steady gait. Chair follow present but not utilized. Pt states several times she feels good walking.  Stairs            Wheelchair Mobility    Modified Rankin (Stroke Patients Only)       Balance Overall balance assessment: Needs assistance Sitting-balance support: Feet supported;No upper extremity supported Sitting balance-Leahy Scale: Fair     Standing balance support: No upper extremity supported;During functional activity Standing balance-Leahy Scale: Fair Standing balance comment: Statically                             Pertinent Vitals/Pain Pain Assessment: No/denies pain    Home Living Family/patient expects to be discharged to:: Private residence Living Arrangements: Children Available Help at Discharge: Family;Available PRN/intermittently;Personal care attendant Type of Home: House Home Access: Ramped entrance     Home Layout: Two level;Able to live on main level with bedroom/bathroom Home Equipment: Dan HumphreysWalker - 2 wheels;Wheelchair - manual;Bedside commode;Cane - single point;Tub bench      Prior Function Level of Independence: Independent with assistive device(s)         Comments: Per pt she is independent at home with RW. Dan HumphreysWalker has AE bag on the side, a bag on the front for personal belongings, and a theraband tied across the front for positioning cue.      Hand Dominance   Dominant Hand: Right    Extremity/Trunk Assessment   Upper Extremity Assessment Upper Extremity  Assessment: Defer to OT evaluation    Lower Extremity Assessment Lower Extremity Assessment: Generalized weakness;LLE deficits/detail LLE Deficits / Details: Noted edema and discoloration of lower leg.     Cervical / Trunk Assessment Cervical / Trunk Assessment: Kyphotic  Communication   Communication: HOH  Cognition  Arousal/Alertness: Awake/alert Behavior During Therapy: WFL for tasks assessed/performed Overall Cognitive Status: Impaired/Different from baseline Area of Impairment: Memory                     Memory: Decreased short-term memory         General Comments: Pt asked therapist's name >5x throughout session. Told the same 3 stories over and over throughout session.       General Comments      Exercises     Assessment/Plan    PT Assessment Patient needs continued PT services  PT Problem List Decreased strength;Decreased range of motion;Decreased activity tolerance;Decreased balance;Decreased mobility;Decreased knowledge of use of DME;Decreased safety awareness;Decreased knowledge of precautions;Decreased skin integrity       PT Treatment Interventions DME instruction;Gait training;Stair training;Functional mobility training;Therapeutic activities;Therapeutic exercise;Neuromuscular re-education;Patient/family education    PT Goals (Current goals can be found in the Care Plan section)  Acute Rehab PT Goals Patient Stated Goal: Home at d/c PT Goal Formulation: With patient Time For Goal Achievement: 07/24/16 Potential to Achieve Goals: Good    Frequency Min 3X/week   Barriers to discharge Decreased caregiver support Pt home alone at times. Not sure how often aide is there with her.    Co-evaluation               AM-PAC PT "6 Clicks" Daily Activity  Outcome Measure Difficulty turning over in bed (including adjusting bedclothes, sheets and blankets)?: A Little Difficulty moving from lying on back to sitting on the side of the bed? : A Little Difficulty sitting down on and standing up from a chair with arms (e.g., wheelchair, bedside commode, etc,.)?: Total Help needed moving to and from a bed to chair (including a wheelchair)?: A Little Help needed walking in hospital room?: A Little Help needed climbing 3-5 steps with a railing? : A Lot 6 Click Score: 15     End of Session Equipment Utilized During Treatment: Gait belt Activity Tolerance: Patient tolerated treatment well Patient left: in chair;with call bell/phone within reach;with chair alarm set Nurse Communication: Mobility status PT Visit Diagnosis: Unsteadiness on feet (R26.81);Muscle weakness (generalized) (M62.81)    Time: 1410-1435 PT Time Calculation (min) (ACUTE ONLY): 25 min   Charges:   PT Evaluation $PT Eval Moderate Complexity: 1 Procedure PT Treatments $Gait Training: 8-22 mins   PT G Codes:        Conni Slipper, PT, DPT Acute Rehabilitation Services Pager: [phone]   Marylynn Pearson 07/17/2016, 3:22 PM

## 2016-07-17 NOTE — Progress Notes (Signed)
PROGRESS NOTE    Jessica Hawkins  ZOX:[id number] DOB: [birth year] DOA: 07/16/2016 PCP: Kimber Relic, MD   Outpatient Specialists:    Brief Narrative:   OZA OBERLE is a 83 y.o. female with medical history significant of recurrent cellulitis to LLE that has occurred since she got a TKA in March of last year.  Most recently was admitted to hospital in Nov after failing outpatient course of doxycycline.  During that admit she also failed Ancef inpatient and ultimately was treated with zosyn and vanc which worked to improve cellulitis before being discharged on clindamycin (which also worked per family).  Symptoms this time onset yesterday around 5pm, Tm 102 at home (100.2 in ED), associated chills.  Took a left over 300mg  PO clindamycin she had at home.  Presented to ED for symptoms.   Assessment & Plan:   Principal Problem:   Cellulitis of left anterior lower leg Active Problems:   Anemia of chronic disease   Cellulitis  LLE cellulitis - -continue empiric vanc since that worked last time in Nov (outpatient Doxy and then inpatient Ancef failed in Nov). -Daily BMP while on vanc -Tylenol PRN fever -elevate extremity -duplex in LE negative  Anemia of chronic disease  -getting IV iron infusions this month.  Baker's cyst  -suspect from arthritis  Hyponatremia  -improved to normal  DVT prophylaxis:  Lovenox   Code Status: Full Code   Family Communication: Caregiver at bedside  Disposition Plan:  Suspect 2-3 days of IV abx PT/OT consult   Consultants:      Subjective: Swelling in LE improved per patient/family  Objective: Vitals:   07/16/16 1902 07/16/16 2047 07/17/16 0037 07/17/16 0534  BP: (!) 117/48 (!) 134/43 (!) 132/52 (!) 133/44  Pulse: 65 (!) 59 62 (!) 59  Resp: 20 18 16 16   Temp: 98 F (36.7 C) 98.2 F (36.8 C) 98.8 F (37.1 C) 97.8 F (36.6 C)  TempSrc: Oral Oral Oral Oral  SpO2: 96% 99% 99% 98%  Weight:      Height:         Intake/Output Summary (Last 24 hours) at 07/17/16 0844 Last data filed at 07/16/16 0849  Gross per 24 hour  Intake              240 ml  Output                0 ml  Net              240 ml   Filed Weights   07/16/16 0500  Weight: 60.3 kg (133 lb)    Examination:  General exam: Appears calm and comfortable-- very hard of hearing Respiratory system: Clear to auscultation. Respiratory effort normal. Cardiovascular system: S1 & S2 heard, RRR. No JVD, murmurs, rubs, gallops or clicks. No pedal edema. Gastrointestinal system: Abdomen is nondistended, soft and nontender. No organomegaly or masses felt. Normal bowel sounds heard. Central nervous system: Alert and oriented. No focal neurological deficits. Extremities: good pulses in LE Skin: skin warm and with redness on left leg Psychiatry: Judgement and insight appear normal. Mood & affect appropriate.     Data Reviewed: I have personally reviewed following labs and imaging studies  CBC:  Recent Labs Lab 07/12/16 1044 07/15/16 2314 07/17/16 0532  WBC 4.4 8.3 4.5  NEUTROABS 2.6  --   --   HGB 9.4* 9.3* 8.7*  HCT 29.9* 30.1* 27.8*  MCV 77.7* 78.6 79.9  PLT 220 200 161  Basic Metabolic Panel:  Recent Labs Lab 07/15/16 2314 07/17/16 0532  NA 132* 135  K 4.0 3.9  CL 99* 105  CO2 23 24  GLUCOSE 131* 102*  BUN 28* 20  CREATININE 0.84 0.80  CALCIUM 8.4* 8.1*   GFR: Estimated Creatinine Clearance: 37.9 mL/min (by C-G formula based on SCr of 0.8 mg/dL). Liver Function Tests:  Recent Labs Lab 07/15/16 2314  AST 25  ALT 14  ALKPHOS 78  BILITOT 0.6  PROT 7.7  ALBUMIN 2.9*   No results for input(s): LIPASE, AMYLASE in the last 168 hours. No results for input(s): AMMONIA in the last 168 hours. Coagulation Profile:  Recent Labs Lab 07/15/16 2314  INR 1.19   Cardiac Enzymes: No results for input(s): CKTOTAL, CKMB, CKMBINDEX, TROPONINI in the last 168 hours. BNP (last 3 results) No results for input(s):  PROBNP in the last 8760 hours. HbA1C: No results for input(s): HGBA1C in the last 72 hours. CBG: No results for input(s): GLUCAP in the last 168 hours. Lipid Profile: No results for input(s): CHOL, HDL, LDLCALC, TRIG, CHOLHDL, LDLDIRECT in the last 72 hours. Thyroid Function Tests: No results for input(s): TSH, T4TOTAL, FREET4, T3FREE, THYROIDAB in the last 72 hours. Anemia Panel: No results for input(s): VITAMINB12, FOLATE, FERRITIN, TIBC, IRON, RETICCTPCT in the last 72 hours. Urine analysis:    Component Value Date/Time   COLORURINE YELLOW 07/15/2016 2341   APPEARANCEUR CLEAR 07/15/2016 2341   LABSPEC 1.013 07/15/2016 2341   PHURINE 6.0 07/15/2016 2341   GLUCOSEU NEGATIVE 07/15/2016 2341   HGBUR MODERATE (A) 07/15/2016 2341   BILIRUBINUR NEGATIVE 07/15/2016 2341   BILIRUBINUR Pos 02/24/2015 1151   KETONESUR NEGATIVE 07/15/2016 2341   PROTEINUR 100 (A) 07/15/2016 2341   UROBILINOGEN 0.2 02/24/2015 1151   UROBILINOGEN 1.0 08/25/2012 1929   NITRITE NEGATIVE 07/15/2016 2341   LEUKOCYTESUR NEGATIVE 07/15/2016 2341    )No results found for this or any previous visit (from the past 240 hour(s)).    Anti-infectives    Start     Dose/Rate Route Frequency Ordered Stop   07/16/16 2200  vancomycin (VANCOCIN) IVPB 750 mg/150 ml premix     750 mg 150 mL/hr over 60 Minutes Intravenous Every 24 hours 07/16/16 0454     07/16/16 0215  vancomycin (VANCOCIN) IVPB 1000 mg/200 mL premix     1,000 mg 200 mL/hr over 60 Minutes Intravenous  Once 07/16/16 [id number] 07/16/16 0410       Radiology Studies: Dg Tibia/fibula Left  Result Date: 07/16/2016 CLINICAL DATA:  Cellulitis and leg pain EXAM: LEFT TIBIA AND FIBULA - 2 VIEW COMPARISON:  Left knee radiograph 02/07/2016 FINDINGS: There is a left total knee arthroplasty and an incompletely visualized antegrade intramedullary femoral nail. There is no abnormal lucency surrounding any of the hardware. The bones remain osteopenic. There is no soft  tissue emphysema. No fracture or dislocation. IMPRESSION: No acute findings of the right tibia and fibula. No hardware adverse features. Electronically Signed   By: Deatra RobinsonKevin  Herman M.D.   On: 07/16/2016 00:35        Scheduled Meds: . aspirin EC  81 mg Oral Daily  . celecoxib  200 mg Oral BID  . enoxaparin (LOVENOX) injection  40 mg Subcutaneous Daily  . ferrous sulfate  325 mg Oral TID WC  . folic acid  1 mg Oral Daily  . furosemide  40 mg Oral Daily  . levothyroxine  25 mcg Oral QAC breakfast  . multivitamin with minerals  1 tablet Oral  Daily   Continuous Infusions: . vancomycin Stopped (07/16/16 2244)     LOS: 1 day    Time spent: 25 min    Jessica Osment U Alexandar Weisenberger, DO [facility] Pager [phone]  If 7PM-7AM, please contact night-coverage [email or web address] Password [facility] 07/17/2016, 8:44 AM

## 2016-07-18 LAB — BASIC METABOLIC PANEL
Anion gap: 5 (ref 5–15)
BUN: 20 mg/dL (ref 6–20)
CO2: 26 mmol/L (ref 22–32)
CREATININE: 0.77 mg/dL (ref 0.44–1.00)
Calcium: 8.1 mg/dL — ABNORMAL LOW (ref 8.9–10.3)
Chloride: 104 mmol/L (ref 101–111)
GFR calc Af Amer: >60 mL/min (ref >60)
GLUCOSE: 99 mg/dL (ref 65–99)
POTASSIUM: 3.9 mmol/L (ref 3.5–5.1)
Sodium: 135 mmol/L (ref 135–145)

## 2016-07-18 LAB — CBC
HEMATOCRIT: 28.6 % — AB (ref 36.0–46.0)
Hemoglobin: 8.6 g/dL — ABNORMAL LOW (ref 12.0–15.0)
MCH: 24 pg — ABNORMAL LOW (ref 26.0–34.0)
MCHC: 30.1 g/dL (ref 30.0–36.0)
MCV: 79.9 fL (ref 78.0–100.0)
PLATELETS: 177 10*3/uL (ref 150–400)
RBC: 3.58 MIL/uL — ABNORMAL LOW (ref 3.87–5.11)
RDW: 19.8 % — AB (ref 11.5–15.5)
WBC: 4.3 10*3/uL (ref 4.0–10.5)

## 2016-07-18 MED ORDER — CLINDAMYCIN HCL 300 MG PO CAPS
300.0000 mg | ORAL_CAPSULE | Freq: Three times a day (TID) | ORAL | Status: DC
  Administered 2016-07-18 – 2016-07-19 (×4): 300 mg via ORAL
  Filled 2016-07-18 (×5): qty 1

## 2016-07-18 MED ORDER — RISAQUAD PO CAPS
2.0000 | ORAL_CAPSULE | Freq: Every day | ORAL | Status: DC
  Administered 2016-07-18 – 2016-07-19 (×2): 2 via ORAL
  Filled 2016-07-18 (×2): qty 2

## 2016-07-18 NOTE — Care Management Note (Signed)
Case Management Note  Patient Details  Name: Jessica Hawkins MRN: [id number] Date of Birth: 06/22/1922  Subjective/Objective:                    Action/Plan: [facility]. CM following.  Expected Discharge Date:                  Expected Discharge Plan:  Home w Home Health Services  In-House Referral:     Discharge planning Services     Post Acute Care Choice:    Choice offered to:     DME Arranged:    DME Agency:     HH Arranged:    HH Agency:     Status of Service:  In process, will continue to follow  If discussed at Long Length of Stay Meetings, dates discussed:    Additional Comments:  Kermit BaloKelli F Quientin Jent, RN 07/18/2016, 12:21 PM

## 2016-07-18 NOTE — Discharge Instructions (Signed)

## 2016-07-18 NOTE — Progress Notes (Signed)
CSW received phone call from pt's daughter, Windell MouldingRuth. Pt's daughter is confused about the need for home health referral for physical therapy for the pt's cellulitis. Pt's daughter also concerned that the insurance will not authorize the home health services in addition to the outpatient physical therapy that the pt currently receives for her shoulder. CSW assured pt's daughter that RNCM would be able to answer questions related to home health services and insurance authorization tomorrow. CSW alerted RNCM.  CSW not needed for discharge planning at this time. CSW signing off.  Blenda Nicelylizabeth Makendra Vigeant LCSW [phone]

## 2016-07-18 NOTE — Evaluation (Signed)
Occupational Therapy Evaluation and Discharge Patient Details Name: Jessica Hawkins MRN: [id number] DOB: [birth year] Today's Date: 07/18/2016    History of Present Illness Pt is a 83 y/o female who presents with recurring LLE cellulitis. She has been having instances of this since L TKA in March of last year per chart review.    Clinical Impression   PTA Pt independent with assistive devices for ADL and mobility. Pt currently at baseline from OT perspective. Able to complete ADL with increased time, and using AE appropriately. Pt educated (with handout) for energy conservation. Education complete. Pt with no further questions or concerns. OT to sign off as she is at baseline. Should any changes occur, please feel free to re-order OT.     Follow Up Recommendations  No OT follow up;Supervision - Intermittent    Equipment Recommendations  None recommended by OT    Recommendations for Other Services       Precautions / Restrictions Precautions Precautions: Fall Restrictions Weight Bearing Restrictions: No      Mobility Bed Mobility Overal bed mobility: Needs Assistance Bed Mobility: Supine to Sit     Supine to sit: Supervision     General bed mobility comments: Supervision and light cues for sequencing and safety as pt transitioned to EOB.   Transfers Overall transfer level: Needs assistance Equipment used: Rolling walker (2 wheeled) Transfers: Sit to/from Stand Sit to Stand: Min guard         General transfer comment: Pt required increased time and close guarding for safety, but able to power up without assist    Balance Overall balance assessment: Needs assistance Sitting-balance support: Feet supported;No upper extremity supported Sitting balance-Leahy Scale: Fair     Standing balance support: No upper extremity supported;During functional activity Standing balance-Leahy Scale: Fair Standing balance comment: Statically                            ADL either performed or assessed with clinical judgement   ADL Overall ADL's : At baseline                                       General ADL Comments: Pt with increased time able to ambulate to bathroom and perform sink level grooming. Pt is well prepared with grabber/reacher and bag for items on walker.      Vision Baseline Vision/History: Wears glasses Wears Glasses: Reading only Patient Visual Report: No change from baseline Vision Assessment?: No apparent visual deficits Additional Comments: Pt reading Bible when OT entered, No problem finding grooming items.     Perception     Praxis      Pertinent Vitals/Pain Pain Assessment: No/denies pain     Hand Dominance Right   Extremity/Trunk Assessment Upper Extremity Assessment Upper Extremity Assessment: Generalized weakness;Overall [facility] for tasks assessed   Lower Extremity Assessment Lower Extremity Assessment: Defer to PT evaluation   Cervical / Trunk Assessment Cervical / Trunk Assessment: Kyphotic   Communication Communication Communication: HOH   Cognition Arousal/Alertness: Awake/alert Behavior During Therapy: WFL for tasks assessed/performed Overall Cognitive Status: Impaired/Different from baseline Area of Impairment: Memory                     Memory: Decreased short-term memory         General Comments: Pt asked therapist name at least 3 times during session,  and invited her to come to church at least 3 times during session. Pt also stated over and over again that this was the first time she'd ever had occupational therapy.    General Comments  Pt very pleasant and willing to work with therapy    Exercises     Shoulder Instructions      Home Living Family/patient expects to be discharged to:: Private residence Living Arrangements: Children Available Help at Discharge: Family;Available PRN/intermittently;Personal care attendant Type of Home: House Home Access: Ramped  entrance     Home Layout: Two level;Able to live on main level with bedroom/bathroom     Bathroom Shower/Tub: Tub/shower unit;Curtain   Bathroom Toilet: Standard     Home Equipment: Environmental consultant - 2 wheels;Wheelchair - manual;Bedside commode;Cane - single point;Tub bench          Prior Functioning/Environment Level of Independence: Independent with assistive device(s)        Comments: Per pt she is independent at home with RW. Dan Humphreys has AE bag on the side, a bag on the front for personal belongings, and a theraband tied across the front for positioning cue.         OT Problem List: Decreased activity tolerance;Impaired balance (sitting and/or standing)      OT Treatment/Interventions:      OT Goals(Current goals can be found in the care plan section) Acute Rehab OT Goals Patient Stated Goal: Home at d/c OT Goal Formulation: With patient Time For Goal Achievement: 08/01/16 Potential to Achieve Goals: Good  OT Frequency:     Barriers to D/C:            Co-evaluation              AM-PAC PT "6 Clicks" Daily Activity     Outcome Measure Help from another person eating meals?: None Help from another person taking care of personal grooming?: A Little Help from another person toileting, which includes using toliet, bedpan, or urinal?: A Little Help from another person bathing (including washing, rinsing, drying)?: A Little Help from another person to put on and taking off regular upper body clothing?: None Help from another person to put on and taking off regular lower body clothing?: A Little 6 Click Score: 20   End of Session Equipment Utilized During Treatment: Gait belt;Rolling walker Nurse Communication: Mobility status;Other (comment) (NT can take blood pressure)  Activity Tolerance: Patient tolerated treatment well Patient left: in chair;with call bell/phone within reach  OT Visit Diagnosis: Unsteadiness on feet (R26.81);Muscle weakness (generalized)  (M62.81)                Time: 5409-8119 OT Time Calculation (min): 29 min Charges:  OT General Charges $OT Visit: 1 Procedure OT Evaluation $OT Eval Low Complexity: 1 Procedure OT Treatments $Self Care/Home Management : 8-22 mins G-Codes:     Sherryl Manges OTR/L [phone]  [employer] Mase Dhondt 07/18/2016, 2:24 PM

## 2016-07-18 NOTE — Progress Notes (Signed)
[facility]. She wants to discuss with family and give answer tomorrow. CM following.

## 2016-07-18 NOTE — Progress Notes (Signed)
PROGRESS NOTE    Jessica Hawkins  ZOX:[id number] DOB: 03/21/1922 DOA: 07/16/2016 PCP: Kimber RelicGreen, Arthur G, MD   Outpatient Specialists:    Brief Narrative:   Jessica Hawkins is a 83 y.o. female with medical history significant of recurrent cellulitis to LLE that has occurred since she got a TKA in March of last year.  Most recently was admitted to hospital in Nov after failing outpatient course of doxycycline.  During that admit she also failed Ancef inpatient and ultimately was treated with zosyn and vanc which worked to improve cellulitis before being discharged on clindamycin (which also worked per family).  Symptoms this time onset yesterday around 5pm, Tm 102 at home (100.2 in ED), associated chills.  Took a left over 300mg  PO clindamycin she had at home.  Presented to ED for symptoms.   Assessment & Plan:   Principal Problem:   Cellulitis of left anterior lower leg Active Problems:   Anemia of chronic disease   Cellulitis  LLE cellulitis - -outpatient Doxy and then inpatient Ancef failed in Nov-- given vanc IV here x 2 days and changed to clindamyacin as that worked in the past-- monitor overnight for improvement -added probiotic- discussed risks of c diff with family while on clindamycin -Tylenol PRN fever -elevate extremity- wears stockings at home -duplex in LE negative  Anemia of chronic disease  -gets IV iron infusions  Baker's cyst  -suspect from arthritis/knee replacement  Hyponatremia  -improved to normal  DVT prophylaxis:  Lovenox   Code Status: Full Code   Family Communication: Caregiver at bedside/daughter at bedside as well  Disposition Plan:  ? Home in AM-- has caregiver at home-- home health recommended   Consultants:      Subjective: Some memory issues-- ask same question over and over No fever, pain has improved  Objective: Vitals:   07/17/16 1815 07/18/16 0014 07/18/16 0453 07/18/16 0947  BP: (!) 141/45 (!) 167/57 (!) 141/45 (!)  122/49  Pulse: 60 63 (!) 59 (!) 57  Resp: 16 16 16 17   Temp: 97.6 F (36.4 C) 97.9 F (36.6 C) 97.8 F (36.6 C) 98 F (36.7 C)  TempSrc: Oral Oral Oral Oral  SpO2: 100% 98% 96% 99%  Weight:      Height:        Intake/Output Summary (Last 24 hours) at 07/18/16 1327 Last data filed at 07/17/16 1815  Gross per 24 hour  Intake              240 ml  Output                0 ml  Net              240 ml   Filed Weights   07/16/16 0500  Weight: 60.3 kg (133 lb)    Examination:  General exam: Appears calm and comfortable-- very hard of hearing Respiratory system: Clear to auscultation. Respiratory effort normal. Cardiovascular system: S1 & S2 heard, RRR. No JVD, murmurs, rubs, gallops or clicks. No pedal edema. Gastrointestinal system: Abdomen is nondistended, soft and nontender. No organomegaly or masses felt. Normal bowel sounds heard. Central nervous system: Alert and pleasant Extremities: pulses intact- DP Skin: improving redness     Data Reviewed: I have personally reviewed following labs and imaging studies  CBC:  Recent Labs Lab 07/12/16 1044 07/15/16 2314 07/17/16 0532 07/18/16 0532  WBC 4.4 8.3 4.5 4.3  NEUTROABS 2.6  --   --   --  HGB 9.4* 9.3* 8.7* 8.6*  HCT 29.9* 30.1* 27.8* 28.6*  MCV 77.7* 78.6 79.9 79.9  PLT 220 200 161 177   Basic Metabolic Panel:  Recent Labs Lab 07/15/16 2314 07/17/16 0532 07/18/16 0532  NA 132* 135 135  K 4.0 3.9 3.9  CL 99* 105 104  CO2 23 24 26   GLUCOSE 131* 102* 99  BUN 28* 20 20  CREATININE 0.84 0.80 0.77  CALCIUM 8.4* 8.1* 8.1*   GFR: Estimated Creatinine Clearance: 37.9 mL/min (by C-G formula based on SCr of 0.77 mg/dL). Liver Function Tests:  Recent Labs Lab 07/15/16 2314  AST 25  ALT 14  ALKPHOS 78  BILITOT 0.6  PROT 7.7  ALBUMIN 2.9*   No results for input(s): LIPASE, AMYLASE in the last 168 hours. No results for input(s): AMMONIA in the last 168 hours. Coagulation Profile:  Recent Labs Lab  07/15/16 2314  INR 1.19   Cardiac Enzymes: No results for input(s): CKTOTAL, CKMB, CKMBINDEX, TROPONINI in the last 168 hours. BNP (last 3 results) No results for input(s): PROBNP in the last 8760 hours. HbA1C: No results for input(s): HGBA1C in the last 72 hours. CBG: No results for input(s): GLUCAP in the last 168 hours. Lipid Profile: No results for input(s): CHOL, HDL, LDLCALC, TRIG, CHOLHDL, LDLDIRECT in the last 72 hours. Thyroid Function Tests: No results for input(s): TSH, T4TOTAL, FREET4, T3FREE, THYROIDAB in the last 72 hours. Anemia Panel: No results for input(s): VITAMINB12, FOLATE, FERRITIN, TIBC, IRON, RETICCTPCT in the last 72 hours. Urine analysis:    Component Value Date/Time   COLORURINE YELLOW 07/15/2016 2341   APPEARANCEUR CLEAR 07/15/2016 2341   LABSPEC 1.013 07/15/2016 2341   PHURINE 6.0 07/15/2016 2341   GLUCOSEU NEGATIVE 07/15/2016 2341   HGBUR MODERATE (A) 07/15/2016 2341   BILIRUBINUR NEGATIVE 07/15/2016 2341   BILIRUBINUR Pos 02/24/2015 1151   KETONESUR NEGATIVE 07/15/2016 2341   PROTEINUR 100 (A) 07/15/2016 2341   UROBILINOGEN 0.2 02/24/2015 1151   UROBILINOGEN 1.0 08/25/2012 1929   NITRITE NEGATIVE 07/15/2016 2341   LEUKOCYTESUR NEGATIVE 07/15/2016 2341    ) Recent Results (from the past 240 hour(s))  Culture, blood (Routine x 2)     Status: None (Preliminary result)   Collection Time: 07/15/16 11:05 PM  Result Value Ref Range Status   Specimen Description BLOOD RIGHT ARM  Final   Special Requests   Final    BOTTLES DRAWN AEROBIC AND ANAEROBIC Blood Culture adequate volume   Culture NO GROWTH 2 DAYS  Final   Report Status PENDING  Incomplete  Culture, blood (Routine x 2)     Status: None (Preliminary result)   Collection Time: 07/15/16 11:17 PM  Result Value Ref Range Status   Specimen Description BLOOD LEFT HAND  Final   Special Requests IN PEDIATRIC BOTTLE Blood Culture adequate volume  Final   Culture NO GROWTH 2 DAYS  Final    Report Status PENDING  Incomplete      Anti-infectives    Start     Dose/Rate Route Frequency Ordered Stop   07/18/16 1000  clindamycin (CLEOCIN) capsule 300 mg     300 mg Oral Every 8 hours 07/18/16 0835     07/16/16 2200  vancomycin (VANCOCIN) IVPB 750 mg/150 ml premix  Status:  Discontinued     750 mg 150 mL/hr over 60 Minutes Intravenous Every 24 hours 07/16/16 0454 07/18/16 0835   07/16/16 0215  vancomycin (VANCOCIN) IVPB 1000 mg/200 mL premix     1,000 mg 200  mL/hr over 60 Minutes Intravenous  Once 07/16/16 1610 07/16/16 0410       Radiology Studies: No results found.      Scheduled Meds: . acidophilus  2 capsule Oral Daily  . aspirin EC  81 mg Oral Daily  . celecoxib  200 mg Oral BID  . clindamycin  300 mg Oral Q8H  . enoxaparin (LOVENOX) injection  40 mg Subcutaneous Daily  . ferrous sulfate  325 mg Oral TID WC  . folic acid  1 mg Oral Daily  . furosemide  40 mg Oral Daily  . levothyroxine  25 mcg Oral QAC breakfast  . multivitamin with minerals  1 tablet Oral Daily   Continuous Infusions:    LOS: 2 days    Time spent: 25 min    Dakoda Bassette U Shaheed Schmuck, DO [facility] Pager [phone]  If 7PM-7AM, please contact night-coverage [email or web address] Password [email or web address] 07/18/2016, 1:27 PM

## 2016-07-19 LAB — BASIC METABOLIC PANEL
Anion gap: 7 (ref 5–15)
BUN: 17 mg/dL (ref 6–20)
CALCIUM: 8.2 mg/dL — AB (ref 8.9–10.3)
CHLORIDE: 106 mmol/L (ref 101–111)
CO2: 25 mmol/L (ref 22–32)
CREATININE: 0.82 mg/dL (ref 0.44–1.00)
Glucose, Bld: 98 mg/dL (ref 65–99)
Potassium: 4.1 mmol/L (ref 3.5–5.1)
SODIUM: 138 mmol/L (ref 135–145)

## 2016-07-19 LAB — CBC
HCT: 31.5 % — ABNORMAL LOW (ref 36.0–46.0)
Hemoglobin: 9.5 g/dL — ABNORMAL LOW (ref 12.0–15.0)
MCH: 24.4 pg — ABNORMAL LOW (ref 26.0–34.0)
MCHC: 30.2 g/dL (ref 30.0–36.0)
MCV: 80.8 fL (ref 78.0–100.0)
PLATELETS: 173 10*3/uL (ref 150–400)
RBC: 3.9 MIL/uL (ref 3.87–5.11)
RDW: 19.9 % — AB (ref 11.5–15.5)
WBC: 4.6 10*3/uL (ref 4.0–10.5)

## 2016-07-19 MED ORDER — RISAQUAD PO CAPS: 2.0000 | ORAL_CAPSULE | Freq: Every day | ORAL | 0 refills | Status: AC

## 2016-07-19 MED ORDER — CLINDAMYCIN HCL 300 MG PO CAPS: 300.0000 mg | ORAL_CAPSULE | Freq: Three times a day (TID) | ORAL | 0 refills | Status: AC

## 2016-07-19 NOTE — Care Management Note (Signed)
Case Management Note  Patient Details  Name: Jessica Hawkins MRN: [id number] Date of Birth: [birth year]  Subjective/Objective:   Admitted with LLE Cellulitis                Action/Plan: CM called patient's [facility]; TCT Windell MouldingRuth, [facility] [name] with [facility] called for arrangements.  Expected Discharge Date:     07/19/2016             Expected Discharge Plan:  Home w Home Health Services  Discharge planning Services  CM Consult Choice offered to:  Adult Children  HH Arranged:  RN, PT, Nurse's Aide [facility]  Status of Service:  In process, will continue to follow  Reola MosherChandler, Taim Wurm L, RN,MHA,BSN [phone] 07/19/2016, 11:39 AM

## 2016-07-19 NOTE — Discharge Summary (Signed)
Physician Discharge Summary  Jessica Hawkins Que RUE:[id number] DOB: 06-03-1922 DOA: 07/16/2016  PCP: Kimber RelicGreen, Arthur G, MD  Admit date: 07/16/2016 Discharge date: 07/19/2016  Admitted From: Home Disposition: Home  Recommendations for Outpatient Follow-up:  1. Follow up with PCP in 1 week 2. Repeat urinalysis for microscopic hematuria  Home Health: PT, RN, aide Equipment/Devices: None  Discharge Condition: Stable CODE STATUS: Full code Diet recommendation: Regular diet   Brief/Interim Summary:  Admission HPI written by Hillary BowJared M Gardner, DO   Chief Complaint: LLE cellulitis  HPI: Jessica Hawkins Older is a 83 y.o. female with medical history significant of recurrent cellulitis to LLE that has occurred since she got a TKA in March of last year.  Most recently was admitted to hospital in Nov after failing outpatient course of doxycycline.  During that admit she also failed Ancef inpatient and ultimately was treated with zosyn and vanc which worked to improve cellulitis before being discharged on clindamycin (which also worked per family).  Symptoms this time onset yesterday around 5pm, Tm 102 at home (100.2 in ED), associated chills.  Took a left over 300mg  PO clindamycin she had at home.  Presented to ED for symptoms.   [facility]    [facility]ospital course:  Left lower extremity cellulitis Patient failed outpatient therapy. Patient started on IV vancomycin which was transitioned to clindamycin with good clinical result. Probiotic added to decrease the risk of Clostridium difficile infection. Continue elevation and ice for symptoms. Lower extremity duplex was obtained and was negative for acute DVT. Continue clindamycin for a total 10 day therapy.  Anemia of chronic disease Managed as an outpatient with IV iron infusions. Continued iron by mouth  Hypothyroidism Continued Synthroid  Possible Baker's cyst If ruptured, could be contributing to patient's mild pain.  Monitor as outpatient.   Hyponatremia Very mild initially at 132. Resolved  Microscopic hematuria Follow-up as an outpatient.   Discharge Diagnoses:  Principal Problem:   Cellulitis of left anterior lower leg Active Problems:   Anemia of chronic disease   Cellulitis    Discharge Instructions  Discharge Instructions    Call MD for:  difficulty breathing, headache or visual disturbances    Complete by:  As directed    Call MD for:  hives    Complete by:  As directed    Call MD for:  persistant dizziness or light-headedness    Complete by:  As directed    Call MD for:  severe uncontrolled pain    Complete by:  As directed    Call MD for:  temperature >100.4    Complete by:  As directed    Diet - low sodium heart healthy    Complete by:  As directed    Increase activity slowly    Complete by:  As directed      Allergies as of 07/19/2016   No Known Allergies     Medication List    STOP taking these medications   fluconazole 100 MG tablet Commonly known as:  DIFLUCAN     TAKE these medications   acidophilus Caps capsule Take 2 capsules by mouth daily. Start taking on:  07/20/2016   aspirin EC 81 MG tablet Take 81 mg by mouth daily.   celecoxib 200 MG capsule Commonly known as:  CELEBREX Take 1 capsule (200 mg total) by mouth 2 (two) times daily.   clindamycin 300 MG capsule Commonly known as:  CLEOCIN Take 1 capsule (300 mg total)  by mouth every 8 (eight) hours.   ferrous sulfate 325 (65 FE) MG tablet Take 1 tablet (325 mg total) by mouth 3 (three) times daily with meals.   folic acid 1 MG tablet Commonly known as:  FOLVITE Take 1 tablet (1 mg total) by mouth daily.   furosemide 40 MG tablet Commonly known as:  LASIX Take one tablet by mouth once daily for edema What changed:  how much to take  how to take this  when to take this  additional instructions   glucose blood test strip One Touch Verio Test Strips. Use to test blood sugar once  daily. Dx:E11.9   levothyroxine 25 MCG tablet Commonly known as:  SYNTHROID, LEVOTHROID take 1 tablet by mouth once daily for THYROID SUPPLEMENT   lidocaine 4 % cream Commonly known as:  ASPERCREME W/LIDOCAINE Apply 4 times daily to right shoulder   multivitamin with minerals Tabs tablet Take 1 tablet by mouth daily. Centrum   onetouch ultrasoft lancets Use to test blood sugar once daily. Dx: E11.9   OVER THE COUNTER MEDICATION Apply 1 application topically See admin instructions. Apply to right shoulder 2-3 times daily as needed for pain. OTC Pain Cream   UDDERLY SMOOTH Crea Apply 1 application topically 2 (two) times daily as needed (for dry skin related to healing cellulitis).      Follow-up Information    Home, [facility] Follow up.   Specialty:  Home Health Services Why:  Previously known as [employer]administrative [employer], they will do your home health care at your home Contact information: [address] [place] [address] [place] [address] [phone]        Kimber Relic, MD. Schedule an appointment as soon as possible for a visit in 1 week(s).   Specialty:  Internal Medicine Contact information: [address] [place] [address] [phone]          No Known Allergies  Consultations:  None   Procedures/Studies: Dg Tibia/fibula Left  Result Date: 07/16/2016 CLINICAL DATA:  Cellulitis and leg pain EXAM: LEFT TIBIA AND FIBULA - 2 VIEW COMPARISON:  Left knee radiograph 02/07/2016 FINDINGS: There is a left total knee arthroplasty and an incompletely visualized antegrade intramedullary femoral nail. There is no abnormal lucency surrounding any of the hardware. The bones remain osteopenic. There is no soft tissue emphysema. No fracture or dislocation. IMPRESSION: No acute findings of the right tibia and fibula. No hardware adverse features. Electronically Signed   By: Deatra Robinson M.D.   On: 07/16/2016 00:35   Mm Screening Breast Tomo Bilateral  Result Date:  06/25/2016 CLINICAL DATA:  Screening. EXAM: 2D DIGITAL SCREENING BILATERAL MAMMOGRAM WITH CAD AND ADJUNCT TOMO COMPARISON:  Previous exam(s). ACR Breast Density Category c: The breast tissue is heterogeneously dense, which may obscure small masses. FINDINGS: There are no findings suspicious for malignancy. Images were processed with CAD. IMPRESSION: No mammographic evidence of malignancy. A result letter of this screening mammogram will be mailed directly to the patient. RECOMMENDATION: Screening mammogram in one year. (Code:SM-B-01Y) BI-RADS CATEGORY  1: Negative. Electronically Signed   By: Bary Richard M.D.   On: 06/25/2016 16:16      Subjective: Patient reports some mild pain in her left leg, however, symptoms much improved.  Discharge Exam: Vitals:   07/19/16 0501 07/19/16 1035  BP: (!) 164/49 (!) 187/54  Pulse: (!) 58 68  Resp: 16 18  Temp: 97.7 F (36.5 C) 98.4 F (36.9 C)   Vitals:   07/18/16 2104  07/19/16 0104 07/19/16 0501 07/19/16 1035  BP: (!) 141/56 (!) 148/47 (!) 164/49 (!) 187/54  Pulse: 60 (!) 58 (!) 58 68  Resp: 16 16 16 18   Temp: 97.4 F (36.3 C) 98.4 F (36.9 C) 97.7 F (36.5 C) 98.4 F (36.9 C)  TempSrc: Oral Oral Oral Oral  SpO2: 98% 97% 97% 98%  Weight:      Height:        General: Pt is alert, awake, not in acute distress Cardiovascular: RRR, S1/S2 +, no rubs, no gallops Respiratory: CTA bilaterally, no wheezing, no rhonchi Abdominal: Soft, NT, ND, bowel sounds + Extremities: no edema, no cyanosis Skin: Very mild erythema of lower left extremity    The results of significant diagnostics from this hospitalization (including imaging, microbiology, ancillary and laboratory) are listed below for reference.     Microbiology: Recent Results (from the past 240 hour(s))  Culture, blood (Routine x 2)     Status: None (Preliminary result)   Collection Time: 07/15/16 11:05 PM  Result Value Ref Range Status   Specimen Description BLOOD RIGHT ARM  Final    Special Requests   Final    BOTTLES DRAWN AEROBIC AND ANAEROBIC Blood Culture adequate volume   Culture NO GROWTH 2 DAYS  Final   Report Status PENDING  Incomplete  Culture, blood (Routine x 2)     Status: None (Preliminary result)   Collection Time: 07/15/16 11:17 PM  Result Value Ref Range Status   Specimen Description BLOOD LEFT HAND  Final   Special Requests IN PEDIATRIC BOTTLE Blood Culture adequate volume  Final   Culture NO GROWTH 2 DAYS  Final   Report Status PENDING  Incomplete     Labs: BNP (last 3 results) No results for input(s): BNP in the last 8760 hours. Basic Metabolic Panel:  Recent Labs Lab 07/15/16 2314 07/17/16 0532 07/18/16 0532 07/19/16 0705  NA 132* 135 135 138  K 4.0 3.9 3.9 4.1  CL 99* 105 104 106  CO2 23 24 26 25   GLUCOSE 131* 102* 99 98  BUN 28* 20 20 17   CREATININE 0.84 0.80 0.77 0.82  CALCIUM 8.4* 8.1* 8.1* 8.2*   Liver Function Tests:  Recent Labs Lab 07/15/16 2314  AST 25  ALT 14  ALKPHOS 78  BILITOT 0.6  PROT 7.7  ALBUMIN 2.9*   No results for input(s): LIPASE, AMYLASE in the last 168 hours. No results for input(s): AMMONIA in the last 168 hours. CBC:  Recent Labs Lab 07/15/16 2314 07/17/16 0532 07/18/16 0532 07/19/16 0705  WBC 8.3 4.5 4.3 4.6  HGB 9.3* 8.7* 8.6* 9.5*  HCT 30.1* 27.8* 28.6* 31.5*  MCV 78.6 79.9 79.9 80.8  PLT 200 161 177 173   Cardiac Enzymes: No results for input(s): CKTOTAL, CKMB, CKMBINDEX, TROPONINI in the last 168 hours. BNP: Invalid input(s): POCBNP CBG: No results for input(s): GLUCAP in the last 168 hours. D-Dimer No results for input(s): DDIMER in the last 72 hours. Hgb A1c No results for input(s): HGBA1C in the last 72 hours. Lipid Profile No results for input(s): CHOL, HDL, LDLCALC, TRIG, CHOLHDL, LDLDIRECT in the last 72 hours. Thyroid function studies No results for input(s): TSH, T4TOTAL, T3FREE, THYROIDAB in the last 72 hours.  Invalid input(s): FREET3 Anemia work up No  results for input(s): VITAMINB12, FOLATE, FERRITIN, TIBC, IRON, RETICCTPCT in the last 72 hours. Urinalysis    Component Value Date/Time   COLORURINE YELLOW 07/15/2016 2341   APPEARANCEUR CLEAR 07/15/2016 2341   LABSPEC  1.013 07/15/2016 2341   PHURINE 6.0 07/15/2016 2341   GLUCOSEU NEGATIVE 07/15/2016 2341   HGBUR MODERATE (A) 07/15/2016 2341   BILIRUBINUR NEGATIVE 07/15/2016 2341   BILIRUBINUR Pos 02/24/2015 1151   KETONESUR NEGATIVE 07/15/2016 2341   PROTEINUR 100 (A) 07/15/2016 2341   UROBILINOGEN 0.2 02/24/2015 1151   UROBILINOGEN 1.0 08/25/2012 1929   NITRITE NEGATIVE 07/15/2016 2341   LEUKOCYTESUR NEGATIVE 07/15/2016 2341   Sepsis Labs Invalid input(s): PROCALCITONIN,  WBC,  LACTICIDVEN Microbiology Recent Results (from the past 240 hour(s))  Culture, blood (Routine x 2)     Status: None (Preliminary result)   Collection Time: 07/15/16 11:05 PM  Result Value Ref Range Status   Specimen Description BLOOD RIGHT ARM  Final   Special Requests   Final    BOTTLES DRAWN AEROBIC AND ANAEROBIC Blood Culture adequate volume   Culture NO GROWTH 2 DAYS  Final   Report Status PENDING  Incomplete  Culture, blood (Routine x 2)     Status: None (Preliminary result)   Collection Time: 07/15/16 11:17 PM  Result Value Ref Range Status   Specimen Description BLOOD LEFT HAND  Final   Special Requests IN PEDIATRIC BOTTLE Blood Culture adequate volume  Final   Culture NO GROWTH 2 DAYS  Final   Report Status PENDING  Incomplete     Time coordinating discharge: Over 30 minutes  SIGNED:   Jacquelin Hawking, MD [facility] 07/19/2016, 12:00 PM Pager [phone]  If 7PM-7AM, please contact night-coverage [email or web address] Password [email or web address]

## 2016-07-19 NOTE — Progress Notes (Signed)
D/c reviewed with patient and family. Emphasis on completing ALL proscribed dose of antibiotics

## 2016-07-20 DIAGNOSIS — D631 Anemia in chronic kidney disease: Secondary | ICD-10-CM | POA: Diagnosis not present

## 2016-07-20 DIAGNOSIS — M1990 Unspecified osteoarthritis, unspecified site: Secondary | ICD-10-CM | POA: Diagnosis not present

## 2016-07-20 DIAGNOSIS — N183 Chronic kidney disease, stage 3 (moderate): Secondary | ICD-10-CM | POA: Diagnosis not present

## 2016-07-20 DIAGNOSIS — E1122 Type 2 diabetes mellitus with diabetic chronic kidney disease: Secondary | ICD-10-CM | POA: Diagnosis not present

## 2016-07-20 DIAGNOSIS — E1151 Type 2 diabetes mellitus with diabetic peripheral angiopathy without gangrene: Secondary | ICD-10-CM | POA: Diagnosis not present

## 2016-07-20 DIAGNOSIS — L03116 Cellulitis of left lower limb: Secondary | ICD-10-CM | POA: Diagnosis not present

## 2016-07-20 DIAGNOSIS — I872 Venous insufficiency (chronic) (peripheral): Secondary | ICD-10-CM | POA: Diagnosis not present

## 2016-07-20 DIAGNOSIS — I129 Hypertensive chronic kidney disease with stage 1 through stage 4 chronic kidney disease, or unspecified chronic kidney disease: Secondary | ICD-10-CM | POA: Diagnosis not present

## 2016-07-21 LAB — CULTURE, BLOOD (ROUTINE X 2)
Culture: NO GROWTH
Culture: NO GROWTH
SPECIAL REQUESTS: ADEQUATE
Special Requests: ADEQUATE

## 2016-07-22 ENCOUNTER — Telehealth: Payer: Self-pay

## 2016-07-22 ENCOUNTER — Ambulatory Visit: Payer: Medicare Other | Admitting: Physical Therapy

## 2016-07-22 NOTE — Telephone Encounter (Signed)
Transition Care Management Follow-up Telephone Call  Date discharged? 07/19/2016  How have you been since you were released from the hospital? "Feeling pretty good"  Do you understand why you were in the hospital? YES  Do you understand the discharge instructions? YES- has assistance   Where were you discharged to? Home   Items Reviewed: Medications reviewed: YES Allergies reviewed: YES Dietary changes reviewed: YES Referrals reviewed: YES   Functional Questionnaire:  Activities of Daily Living (ADLs):   She states they are independent in the following: dressing, bathing, feeding  States they require assistance with the following: cooking, house - keeping, assistance with shoes, combing hair, transportation   Any transportation issues/concerns?: NO  Any patient concerns?  NO   Confirmed importance and date/time of follow-up visits scheduled YES Provider Appointment booked with Jeannelle Wiens reed on May 31st at 230pm   Confirmed with patient if condition begins to worsen call PCP or go to the ER.  Patient was given the office number and encouraged to call back with question or concerns.  : YES

## 2016-07-23 ENCOUNTER — Encounter: Payer: Self-pay | Admitting: Podiatry

## 2016-07-23 ENCOUNTER — Ambulatory Visit (INDEPENDENT_AMBULATORY_CARE_PROVIDER_SITE_OTHER): Payer: Medicare Other | Admitting: Podiatry

## 2016-07-23 DIAGNOSIS — I872 Venous insufficiency (chronic) (peripheral): Secondary | ICD-10-CM | POA: Diagnosis not present

## 2016-07-23 DIAGNOSIS — M79676 Pain in unspecified toe(s): Secondary | ICD-10-CM | POA: Diagnosis not present

## 2016-07-23 DIAGNOSIS — M1990 Unspecified osteoarthritis, unspecified site: Secondary | ICD-10-CM | POA: Diagnosis not present

## 2016-07-23 DIAGNOSIS — B351 Tinea unguium: Secondary | ICD-10-CM

## 2016-07-23 DIAGNOSIS — I129 Hypertensive chronic kidney disease with stage 1 through stage 4 chronic kidney disease, or unspecified chronic kidney disease: Secondary | ICD-10-CM | POA: Diagnosis not present

## 2016-07-23 DIAGNOSIS — E1122 Type 2 diabetes mellitus with diabetic chronic kidney disease: Secondary | ICD-10-CM | POA: Diagnosis not present

## 2016-07-23 DIAGNOSIS — G609 Hereditary and idiopathic neuropathy, unspecified: Secondary | ICD-10-CM

## 2016-07-23 DIAGNOSIS — L84 Corns and callosities: Secondary | ICD-10-CM | POA: Diagnosis not present

## 2016-07-23 DIAGNOSIS — L03116 Cellulitis of left lower limb: Secondary | ICD-10-CM | POA: Diagnosis not present

## 2016-07-23 DIAGNOSIS — E1151 Type 2 diabetes mellitus with diabetic peripheral angiopathy without gangrene: Secondary | ICD-10-CM | POA: Diagnosis not present

## 2016-07-23 NOTE — Progress Notes (Signed)
Patient ID: Jessica MunchPauline G Hawkins, female   DOB: 1922/11/27, 83 y.o.   MRN: [id number]   Subjective: This patient presents today for ongoing debridement of mycotic toenails and a painful keratoses on the distal third right toe. Patient is currently under treatment for cellulitis of left lower extremity and has a recent history of hospitalization and current medication for the cellulitis. The treating doctor of the cellulitis suggested to the patient, the patient's daughter and the patient's caregiver who are all present in the treatment room that the chronic mycotic toenails may contribute to the recurrent cellulitis. The patient's daughter wanted a no treatment options for mycotic toenails today.   Objective: Pitting edema bilaterally, left greater than right Palpable pedal pulses bilaterally Sensation to 10 g monofilament wire intact 3/5 bilaterally Vibratory sensation reactive bilaterally Ankle reflexes reactive bilaterally HAV bilaterally Hammertoe 2-4 bilaterally The toenails are extremely elongated, brittle, deformed, discolored and tender to palpation 6-10 No open skin lesions bilaterally Dorsi flexion, plantar flexion 5/5 right and 5/5 left Corn third right toewith dried blood within the corn. There is no active drainage from the corn on the distal third right toe.  Assessment: Type II diabetic with a history of peripheral arterial disease and peripheral neuropathy Hammertoe third left with pre-ulcerative corn distal third left toe Symptomatic onychomycoses 6-10 Cellulitis left lower extremity under medical management at this time  Plan: Debridement toenails 6-10 mechanically and electrically without any bleeding Debrided distal keratoses third right toe without any bleeding Dispensed toe crest for toes 2-4 right Discuss treatment options for fungal toenails including repetitive debridement, topical medication oral medication. I informed the patient that I thought the fungal  toenails were not an obvious source of the cellulitis. I made him aware that oral medication had this high success rate, however, even with oral medication the nail fungus could persist. At this time as patient is actively treated for cellulitis I did not recommend any additional oral antifungal medication. After the infection resolves consider oral medication.  Reappoint 3 months  Reappoint 3 months

## 2016-07-23 NOTE — Patient Instructions (Signed)

## 2016-07-24 ENCOUNTER — Ambulatory Visit: Payer: Medicare Other | Admitting: Physical Therapy

## 2016-07-24 DIAGNOSIS — I872 Venous insufficiency (chronic) (peripheral): Secondary | ICD-10-CM | POA: Diagnosis not present

## 2016-07-24 DIAGNOSIS — L03116 Cellulitis of left lower limb: Secondary | ICD-10-CM | POA: Diagnosis not present

## 2016-07-24 DIAGNOSIS — E1151 Type 2 diabetes mellitus with diabetic peripheral angiopathy without gangrene: Secondary | ICD-10-CM | POA: Diagnosis not present

## 2016-07-24 DIAGNOSIS — M1990 Unspecified osteoarthritis, unspecified site: Secondary | ICD-10-CM | POA: Diagnosis not present

## 2016-07-24 DIAGNOSIS — E1122 Type 2 diabetes mellitus with diabetic chronic kidney disease: Secondary | ICD-10-CM | POA: Diagnosis not present

## 2016-07-24 DIAGNOSIS — I129 Hypertensive chronic kidney disease with stage 1 through stage 4 chronic kidney disease, or unspecified chronic kidney disease: Secondary | ICD-10-CM | POA: Diagnosis not present

## 2016-07-26 DIAGNOSIS — I872 Venous insufficiency (chronic) (peripheral): Secondary | ICD-10-CM | POA: Diagnosis not present

## 2016-07-26 DIAGNOSIS — E1122 Type 2 diabetes mellitus with diabetic chronic kidney disease: Secondary | ICD-10-CM | POA: Diagnosis not present

## 2016-07-26 DIAGNOSIS — I129 Hypertensive chronic kidney disease with stage 1 through stage 4 chronic kidney disease, or unspecified chronic kidney disease: Secondary | ICD-10-CM | POA: Diagnosis not present

## 2016-07-26 DIAGNOSIS — L03116 Cellulitis of left lower limb: Secondary | ICD-10-CM | POA: Diagnosis not present

## 2016-07-26 DIAGNOSIS — M1990 Unspecified osteoarthritis, unspecified site: Secondary | ICD-10-CM | POA: Diagnosis not present

## 2016-07-26 DIAGNOSIS — E1151 Type 2 diabetes mellitus with diabetic peripheral angiopathy without gangrene: Secondary | ICD-10-CM | POA: Diagnosis not present

## 2016-07-30 DIAGNOSIS — E1122 Type 2 diabetes mellitus with diabetic chronic kidney disease: Secondary | ICD-10-CM | POA: Diagnosis not present

## 2016-07-30 DIAGNOSIS — M1990 Unspecified osteoarthritis, unspecified site: Secondary | ICD-10-CM | POA: Diagnosis not present

## 2016-07-30 DIAGNOSIS — I129 Hypertensive chronic kidney disease with stage 1 through stage 4 chronic kidney disease, or unspecified chronic kidney disease: Secondary | ICD-10-CM | POA: Diagnosis not present

## 2016-07-30 DIAGNOSIS — E1151 Type 2 diabetes mellitus with diabetic peripheral angiopathy without gangrene: Secondary | ICD-10-CM | POA: Diagnosis not present

## 2016-07-30 DIAGNOSIS — I872 Venous insufficiency (chronic) (peripheral): Secondary | ICD-10-CM | POA: Diagnosis not present

## 2016-07-30 DIAGNOSIS — L03116 Cellulitis of left lower limb: Secondary | ICD-10-CM | POA: Diagnosis not present

## 2016-07-31 ENCOUNTER — Ambulatory Visit: Payer: Medicare Other | Admitting: Podiatry

## 2016-08-01 ENCOUNTER — Ambulatory Visit (INDEPENDENT_AMBULATORY_CARE_PROVIDER_SITE_OTHER): Payer: Medicare Other | Admitting: Internal Medicine

## 2016-08-01 ENCOUNTER — Encounter: Payer: Self-pay | Admitting: Internal Medicine

## 2016-08-01 VITALS — BP 148/76 | HR 65 | Temp 97.7°F | Ht 63.0 in | Wt 135.0 lb

## 2016-08-01 DIAGNOSIS — M25561 Pain in right knee: Secondary | ICD-10-CM

## 2016-08-01 DIAGNOSIS — R4189 Other symptoms and signs involving cognitive functions and awareness: Secondary | ICD-10-CM | POA: Diagnosis not present

## 2016-08-01 DIAGNOSIS — E1151 Type 2 diabetes mellitus with diabetic peripheral angiopathy without gangrene: Secondary | ICD-10-CM | POA: Diagnosis not present

## 2016-08-01 DIAGNOSIS — I129 Hypertensive chronic kidney disease with stage 1 through stage 4 chronic kidney disease, or unspecified chronic kidney disease: Secondary | ICD-10-CM | POA: Diagnosis not present

## 2016-08-01 DIAGNOSIS — E1122 Type 2 diabetes mellitus with diabetic chronic kidney disease: Secondary | ICD-10-CM | POA: Diagnosis not present

## 2016-08-01 DIAGNOSIS — I872 Venous insufficiency (chronic) (peripheral): Secondary | ICD-10-CM | POA: Diagnosis not present

## 2016-08-01 DIAGNOSIS — L03116 Cellulitis of left lower limb: Secondary | ICD-10-CM

## 2016-08-01 DIAGNOSIS — D638 Anemia in other chronic diseases classified elsewhere: Secondary | ICD-10-CM | POA: Diagnosis not present

## 2016-08-01 DIAGNOSIS — M1990 Unspecified osteoarthritis, unspecified site: Secondary | ICD-10-CM | POA: Diagnosis not present

## 2016-08-01 DIAGNOSIS — G8929 Other chronic pain: Secondary | ICD-10-CM

## 2016-08-01 NOTE — Progress Notes (Addendum)
Location:  [facility] clinic Provider:  Ellison Leisure L. Renato Gails, D.O., C.M.D.  Code Status: Full code Goals of Care:  Advanced Directives 08/01/2016  Does Patient Have a Medical Advance Directive? Yes  Type of Advance Directive Healthcare Power of Attorney  Does patient want to make changes to medical advance directive? -  Copy of Healthcare Power of Attorney in Chart? Yes  Would patient like information on creating a medical advance directive? -  Pre-existing out of facility DNR order (yellow form or pink MOST form) -   Chief Complaint  Patient presents with  . Medical Management of Chronic Issues    swelling left lower leg, is cellulitis cleared, finished medication, is it just chronic edema?  Marland Kitchen Anemia    what is her iron level, does she need more IV iron?  . Knee Pain    is she ready for right knee surgery?  Here with daughter Elita Quick and Delice Bison Care giver    HPI: Patient is a 83 y.o. female seen today for medical management of chronic diseases and clearance for her right knee surgery.    Left lower leg edema and cellulitis:  Left leg is no longer painful, no longer red, remains chronically swollen.  Has been swollen for years.  Has some venous insufficiency of both legs.  Pt asked me between 5-10 times about the cellulitis vs. The edema and her daughter took extensive notes.    Anemia:  Requests results of blood counts.  Sees Dr. Candise Che on 6/6 and he will determine if she needs more IV iron.    Right knee arthritis:  Wants knee surgery.   I don't even see right knee xrays in the chart, only left.  Apparently, she saw Dr. Valentina Gu for these and a knee replacement was recommended--we need these records.  We had an extensive discussion about how she is really a poor surgical candidate given her current difficulties with the edema, anemia, and my concerns about how a big surgery will further alter her cognitive status--memory is already poor with repeating herself numerous times during the appointment.     Past Medical History:  Diagnosis Date  . Acute blood loss anemia   . Anginal pain [facility])    'i haven't had them in a long time but i used to'  . Arthritis    Osteoarthritis  . BBB (bundle branch block)    RT    . Debility, unspecified   . Diabetes mellitus without complication (HCC)    Diet controlled  . Diaphragmatic hernia without mention of obstruction or gangrene   . Disturbance of skin sensation   . Edema   . Fibrocystic breast   . Gait disorder   . GERD (gastroesophageal reflux disease) 06/29/2011  . Hiatal hernia   . History of transfusion of packed red blood cells   . Hyperlipidemia   . Hypertension 06/29/11  . Hypothyroid 06/29/2011  . Insomnia   . OAB (overactive bladder)   . Osteoporosis   . Unspecified constipation   . Unspecified sleep apnea   . Unspecified urinary incontinence   . Unspecified vitamin D deficiency   . Unsteady gait   . Vertigo     Past Surgical History:  Procedure Laterality Date  . EYE SURGERY Right 2005   Cataract surgery  . EYE SURGERY Left 2006   Cataract surgery  . FEMUR IM NAIL  06/29/2011   Procedure: INTRAMEDULLARY (IM) NAIL FEMORAL;  Surgeon: Verlee Rossetti, MD;  Location: [facility] OR;  Service:  Orthopedics;  Laterality: Left;  . HIP SURGERY Right 2002   ORIF  . ROTATOR CUFF REPAIR Left 1999   Tear  . THYROIDECTOMY  1952  . TONSILLECTOMY    . TOTAL KNEE ARTHROPLASTY Left 05/29/2015   Procedure: TOTAL KNEE ARTHROPLASTY;  Surgeon: Dannielle Huh, MD;  Location: [facility] OR;  Service: Orthopedics;  Laterality: Left;    No Known Allergies  Allergies as of 08/01/2016   No Known Allergies     Medication List       Accurate as of 08/01/16  3:08 PM. Always use your most recent med list.          aspirin EC 81 MG tablet Take 81 mg by mouth daily.   celecoxib 200 MG capsule Commonly known as:  CELEBREX Take 1 capsule (200 mg total) by mouth 2 (two) times daily.   ferrous sulfate 325 (65 FE) MG tablet Take 1 tablet (325 mg total) by  mouth 3 (three) times daily with meals.   folic acid 1 MG tablet Commonly known as:  FOLVITE Take 1 tablet (1 mg total) by mouth daily.   furosemide 40 MG tablet Commonly known as:  LASIX Take one tablet by mouth once daily for edema   glucose blood test strip One Touch Verio Test Strips. Use to test blood sugar once daily. Dx:E11.9   levothyroxine 25 MCG tablet Commonly known as:  SYNTHROID, LEVOTHROID take 1 tablet by mouth once daily for THYROID SUPPLEMENT   lidocaine 4 % cream Commonly known as:  ASPERCREME W/LIDOCAINE Apply 4 times daily to right shoulder   multivitamin with minerals Tabs tablet Take 1 tablet by mouth daily. Centrum   onetouch ultrasoft lancets Use to test blood sugar once daily. Dx: E11.9   OVER THE COUNTER MEDICATION Apply 1 application topically See admin instructions. Apply to right shoulder 2-3 times daily as needed for pain. OTC Pain Cream   UDDERLY SMOOTH Crea Apply 1 application topically 2 (two) times daily as needed (for dry skin related to healing cellulitis).       Review of Systems:  Review of Systems  Constitutional: Negative for chills and fever.  HENT: Positive for hearing loss. Negative for congestion.   Eyes: Negative for blurred vision.  Respiratory: Positive for shortness of breath. Negative for cough.   Cardiovascular: Positive for leg swelling. Negative for chest pain and palpitations.       Left chronically greater than right  Gastrointestinal: Negative for abdominal pain.  Genitourinary: Negative for dysuria.  Musculoskeletal: Positive for joint pain.  Skin: Negative for itching and rash.  Neurological: Negative for dizziness.  Endo/Heme/Allergies: Bruises/bleeds easily.  Psychiatric/Behavioral: Positive for memory loss. Negative for depression.    Health Maintenance  Topic Date Due  . FOOT EXAM  08/19/2014  . OPHTHALMOLOGY EXAM  12/03/2014  . HEMOGLOBIN A1C  06/19/2016  . URINE MICROALBUMIN  07/20/2016  .  INFLUENZA VACCINE  10/02/2016  . TETANUS/TDAP  03/12/2021  . DEXA SCAN  Completed  . PNA vac Low Risk Adult  Completed    Physical Exam: Vitals:   08/01/16 1457  BP: (!) 148/76  Pulse: 65  Temp: 97.7 F (36.5 C)  TempSrc: Oral  SpO2: 95%  Weight: 135 lb (61.2 kg)  Height: 5\' 3"  (1.6 m)   Body mass index is 23.91 kg/m. Physical Exam  Constitutional: She is oriented to person, place, and time. She appears well-developed. No distress.  Cardiovascular: Normal rate, regular rhythm and normal heart sounds.   Pulmonary/Chest: Effort  normal and breath sounds normal.  Abdominal: Bowel sounds are normal.  Musculoskeletal: Normal range of motion.  Walks very slowly with rollator walker  Neurological: She is alert and oriented to person, place, and time.  Poor short term memory  Skin:  Erythema and warmth of left lower leg resolved, remains chronically much more edematous than right  Psychiatric: She has a normal mood and affect.    Labs reviewed: Basic Metabolic Panel:  Recent Labs  16/10/96 1359  07/17/16 0532 07/18/16 0532 07/19/16 0705  NA  --   < > 135 135 138  K  --   < > 3.9 3.9 4.1  CL  --   < > 105 104 106  CO2  --   < > 24 26 25   GLUCOSE  --   < > 102* 99 98  BUN  --   < > 20 20 17   CREATININE  --   < > 0.80 0.77 0.82  CALCIUM  --   < > 8.1* 8.1* 8.2*  TSH 0.621  --   --   --   --   < > = values in this interval not displayed. Liver Function Tests:  Recent Labs  01/31/16 1400 06/10/16 1359 06/10/16 1400 07/15/16 2314  AST 31 24  --  25  ALT 16 13  --  14  ALKPHOS 64 102  --  78  BILITOT 0.9 0.39  --  0.6  PROT 6.8 8.5* 7.9 7.7  ALBUMIN 2.9* 3.1*  --  2.9*    Recent Labs  10/23/15 0408  LIPASE 25   No results for input(s): AMMONIA in the last 8760 hours. CBC:  Recent Labs  05/09/16 1142  06/10/16 1359 07/12/16 1044  07/17/16 0532 07/18/16 0532 07/19/16 0705  WBC 5.4  < > 4.4 4.4  < > 4.5 4.3 4.6  NEUTROABS 3,564  --  2.3 2.6  --   --    --   --   HGB 9.1*  < > 9.1* 9.4*  < > 8.7* 8.6* 9.5*  HCT 29.6*  < > 29.4* 29.9*  < > 27.8* 28.6* 31.5*  MCV 78.5*  < > 77.6* 77.7*  < > 79.9 79.9 80.8  PLT 321  < > 249 220  < > 161 177 173  < > = values in this interval not displayed. Lipid Panel: No results for input(s): CHOL, HDL, LDLCALC, TRIG, CHOLHDL, LDLDIRECT in the last 8760 hours. Lab Results  Component Value Date   HGBA1C 5.9 (H) 12/20/2015    Procedures since last visit: Dg Tibia/fibula Left  Result Date: 07/16/2016 CLINICAL DATA:  Cellulitis and leg pain EXAM: LEFT TIBIA AND FIBULA - 2 VIEW COMPARISON:  Left knee radiograph 02/07/2016 FINDINGS: There is a left total knee arthroplasty and an incompletely visualized antegrade intramedullary femoral nail. There is no abnormal lucency surrounding any of the hardware. The bones remain osteopenic. There is no soft tissue emphysema. No fracture or dislocation. IMPRESSION: No acute findings of the right tibia and fibula. No hardware adverse features. Electronically Signed   By: Deatra Robinson M.D.   On: 07/16/2016 00:35    Assessment/Plan 1. Chronic pain of right knee -need records from ortho about extent of OA and to see what actual recommendations were--pt reports surgery was recommended, but she is a very poor candidate for that at 89 with   venous insufficiency and cognitive impairment,and diabetes  2. Anemia of chronic disease -ongoing, last hgb was 9.5, sees hematology  upcoming for iron studies and possibly iron infusion  3. Cellulitis of left lower extremity -resolved  4. Venous insufficiency (chronic) (peripheral) -left greater than right; also has left foot drop; suspect she's had prior DVT in left leg based on chronic edema -I don't see any venous studies on file, only arterial  5. Cognitive impairment -short term memory loss minute to minute during appt, f/u mmse at next appt to assess level of dementia  Labs/tests ordered:  No orders of the defined types were  placed in this encounter.  Next appt:  10/03/2016    Devanny Palecek L. Irine Heminger, D.O. Geriatrics [facility] [address] [place] [place], [place] [address] Cell Phone (Mon-Fri 8am-5pm):  [phone] On Call:  [phone] & follow prompts after 5pm & weekends Office Phone:  [phone] Office Fax:  [phone]

## 2016-08-06 ENCOUNTER — Other Ambulatory Visit: Payer: Self-pay | Admitting: *Deleted

## 2016-08-06 ENCOUNTER — Telehealth: Payer: Self-pay | Admitting: *Deleted

## 2016-08-06 DIAGNOSIS — D638 Anemia in other chronic diseases classified elsewhere: Secondary | ICD-10-CM

## 2016-08-06 NOTE — Telephone Encounter (Signed)
Gracie with [facility] called requesting verbal orders for PT 1x1wk and 2x6wks. Verbal orders given.

## 2016-08-07 ENCOUNTER — Other Ambulatory Visit (HOSPITAL_BASED_OUTPATIENT_CLINIC_OR_DEPARTMENT_OTHER): Payer: Medicare Other

## 2016-08-07 ENCOUNTER — Telehealth: Payer: Self-pay | Admitting: Physical Therapy

## 2016-08-07 DIAGNOSIS — D509 Iron deficiency anemia, unspecified: Secondary | ICD-10-CM | POA: Diagnosis present

## 2016-08-07 DIAGNOSIS — D638 Anemia in other chronic diseases classified elsewhere: Secondary | ICD-10-CM

## 2016-08-07 LAB — CBC WITH DIFFERENTIAL/PLATELET
BASO%: 0.7 % (ref 0.0–2.0)
Basophils Absolute: 0 10*3/uL (ref 0.0–0.1)
EOS%: 1.3 % (ref 0.0–7.0)
Eosinophils Absolute: 0 10*3/uL (ref 0.0–0.5)
HCT: 33.3 % — ABNORMAL LOW (ref 34.8–46.6)
HGB: 10.7 g/dL — ABNORMAL LOW (ref 11.6–15.9)
LYMPH%: 35.9 % (ref 14.0–49.7)
MCH: 25.8 pg (ref 25.1–34.0)
MCHC: 32.2 g/dL (ref 31.5–36.0)
MCV: 80.1 fL (ref 79.5–101.0)
MONO#: 0.5 10*3/uL (ref 0.1–0.9)
MONO%: 12.6 % (ref 0.0–14.0)
NEUT#: 1.8 10*3/uL (ref 1.5–6.5)
NEUT%: 49.5 % (ref 38.4–76.8)
PLATELETS: 200 10*3/uL (ref 145–400)
RBC: 4.16 10*6/uL (ref 3.70–5.45)
RDW: 22.6 % — ABNORMAL HIGH (ref 11.2–14.5)
WBC: 3.6 10*3/uL — AB (ref 3.9–10.3)
lymph#: 1.3 10*3/uL (ref 0.9–3.3)

## 2016-08-07 LAB — IRON AND TIBC
%SAT: 29 % (ref 21–57)
IRON: 62 ug/dL (ref 41–142)
TIBC: 215 ug/dL — AB (ref 236–444)
UIBC: 154 ug/dL (ref 120–384)

## 2016-08-07 LAB — FERRITIN: Ferritin: 1658 ng/ml — ABNORMAL HIGH (ref 9–269)

## 2016-08-07 LAB — COMPREHENSIVE METABOLIC PANEL
ALT: 11 U/L (ref 0–55)
ANION GAP: 7 meq/L (ref 3–11)
AST: 22 U/L (ref 5–34)
Albumin: 3.2 g/dL — ABNORMAL LOW (ref 3.5–5.0)
Alkaline Phosphatase: 95 U/L (ref 40–150)
BUN: 30.4 mg/dL — ABNORMAL HIGH (ref 7.0–26.0)
CHLORIDE: 104 meq/L (ref 98–109)
CO2: 27 meq/L (ref 22–29)
CREATININE: 1 mg/dL (ref 0.6–1.1)
Calcium: 9.1 mg/dL (ref 8.4–10.4)
EGFR: 57 mL/min/{1.73_m2} — AB (ref >90)
GLUCOSE: 99 mg/dL (ref 70–140)
Potassium: 4.6 mEq/L (ref 3.5–5.1)
SODIUM: 137 meq/L (ref 136–145)
Total Bilirubin: 0.49 mg/dL (ref 0.20–1.20)
Total Protein: 8 g/dL (ref 6.4–8.3)

## 2016-08-07 NOTE — Telephone Encounter (Signed)
Preferred cell number rang and ended without availability of voicemail. Left message on home number letting pt know that if she was going to return to outpatient rehab that we would need a new script due to hospitalization and change in medical status.  Jessica Hawkins C. Bobak Oguinn PT, DPT 08/07/16 1:36 PM

## 2016-08-08 ENCOUNTER — Other Ambulatory Visit: Payer: Medicare Other

## 2016-08-08 ENCOUNTER — Ambulatory Visit (HOSPITAL_BASED_OUTPATIENT_CLINIC_OR_DEPARTMENT_OTHER): Payer: Medicare Other | Admitting: Hematology

## 2016-08-08 ENCOUNTER — Encounter: Payer: Self-pay | Admitting: Hematology

## 2016-08-08 ENCOUNTER — Telehealth: Payer: Self-pay | Admitting: Hematology

## 2016-08-08 VITALS — BP 160/52 | HR 67 | Temp 97.7°F | Resp 17 | Ht 63.0 in | Wt 136.2 lb

## 2016-08-08 DIAGNOSIS — D890 Polyclonal hypergammaglobulinemia: Secondary | ICD-10-CM | POA: Diagnosis not present

## 2016-08-08 DIAGNOSIS — D638 Anemia in other chronic diseases classified elsewhere: Secondary | ICD-10-CM

## 2016-08-08 DIAGNOSIS — D649 Anemia, unspecified: Secondary | ICD-10-CM | POA: Diagnosis not present

## 2016-08-08 DIAGNOSIS — D509 Iron deficiency anemia, unspecified: Secondary | ICD-10-CM

## 2016-08-08 NOTE — Patient Instructions (Signed)
Thank you for choosing [facility] to provide your oncology and hematology care.  To afford each patient quality time with our providers, please arrive 30 minutes before your scheduled appointment time.  If you arrive late for your appointment, you may be asked to reschedule.  We strive to give you quality time with our providers, and arriving late affects you and other patients whose appointments are after yours.   If you are a no show for multiple scheduled visits, you may be dismissed from the clinic at the providers discretion.    Again, thank you for choosing [facility], our hope is that these requests will decrease the amount of time that you wait before being seen by our physicians.  ______________________________________________________________________  Should you have questions after your visit to the [facility], please contact our office at [phone] between the hours of 8:30 and 4:30 p.m.    Voicemails left after 4:30p.m will not be returned until the following business day.    For prescription refill requests, please have your pharmacy contact us directly.  Please also try to allow 48 hours for prescription requests.    Please contact the scheduling department for questions regarding scheduling.  For scheduling of procedures such as PET scans, CT scans, MRI, Ultrasound, etc please contact central scheduling at [phone].    Resources For Cancer Patients and Caregivers:   Oncolink.org:  A wonderful resource for patients and healthcare providers for information regarding your disease, ways to tract your treatment, what to expect, etc.     American Cancer Society:  800-227-2345  Can help patients locate various types of support and financial assistance  Cancer Care: 1-800-813-HOPE (4673) Provides financial assistance, online support groups, medication/co-pay assistance.    [place] DSS:  336-641-3447 Where to apply for food  stamps, Medicaid, and utility assistance  Medicare Rights Center: 800-333-4114 Helps people with Medicare understand their rights and benefits, navigate the Medicare system, and secure the quality healthcare they deserve  SCAT: 336-333-6589 [employer]'s shared-ride transportation service for eligible riders who have a disability that prevents them from riding the fixed route bus.    For additional information on assistance programs please contact our social worker:   Grier Hock/Abigail Elmore:  [phone]            

## 2016-08-08 NOTE — Telephone Encounter (Signed)
Lab and follow up with Dr Candise CheKale in 4 months, per 08/08/16 los. Patient was given a copy of the AVS report and appointment schedule, per 08/08/16 los.

## 2016-08-09 ENCOUNTER — Telehealth: Payer: Self-pay

## 2016-08-09 DIAGNOSIS — I872 Venous insufficiency (chronic) (peripheral): Secondary | ICD-10-CM | POA: Diagnosis not present

## 2016-08-09 DIAGNOSIS — L03116 Cellulitis of left lower limb: Secondary | ICD-10-CM | POA: Diagnosis not present

## 2016-08-09 DIAGNOSIS — I129 Hypertensive chronic kidney disease with stage 1 through stage 4 chronic kidney disease, or unspecified chronic kidney disease: Secondary | ICD-10-CM | POA: Diagnosis not present

## 2016-08-09 DIAGNOSIS — M1990 Unspecified osteoarthritis, unspecified site: Secondary | ICD-10-CM | POA: Diagnosis not present

## 2016-08-09 DIAGNOSIS — E1122 Type 2 diabetes mellitus with diabetic chronic kidney disease: Secondary | ICD-10-CM | POA: Diagnosis not present

## 2016-08-09 DIAGNOSIS — E1151 Type 2 diabetes mellitus with diabetic peripheral angiopathy without gangrene: Secondary | ICD-10-CM | POA: Diagnosis not present

## 2016-08-09 NOTE — Telephone Encounter (Signed)
Patient's daughter Windell MouldingRuth called to clarify why Dr.Reed thinks patient is not a candidate for knee surgery and Dr.Green felt patient was a candidate. Windell MouldingRuth mentioned she is not trying to question Dr.Reed's decision she is trying to understand.  I reviewed last OV note patient had with Dr.Reed and informed Windell MouldingRuth of Dr.Reed's reasoning:  (Copied and pasted from Dr.Reed's asessment and plan from 08/01/16) need records from ortho about extent of OA and to see what actual recommendations were--pt reports surgery was recommended, but she is a very poor candidate for that at 89 with chf, cad, venous insufficiency and cognitive impairment, "bleeding disorder" on record, CKD3 and diabetes  Windell MouldingRuth was baffled when I informed her of the above response and states half of those diagnosis must be incorrect for her mother nor she has ever been informed about CFH, CAD, CKD3, or "bleeding disorder"  Windell MouldingRuth states she needs to speak directly to Dr.Reed and Dr.Reed only. Message forwarded to Dr.Reed and her assistant Ko OlinaDee.    I called and requested last Ortho note, receptionist states it is from 04/2015, awaiting fax

## 2016-08-09 NOTE — Telephone Encounter (Signed)
Patient's daughter called back to get the status of Dr.Reed returning her call. Patient's daughter was informed by Bo MerinoGeri (PSC-Receptionist/Medical Records) that Dr.Reed was sent her message and will call when time allowed   Windell MouldingRuth stressed that she needed a call today

## 2016-08-12 NOTE — Telephone Encounter (Signed)
.  left message to have patient return my call.  

## 2016-08-12 NOTE — Telephone Encounter (Signed)
All I have to say is that these diagnoses are and have been in her mother's chart.  They are not new.  They are chronic conditions so unlikely to improve to a point where a knee replacement is a safe procedure for her.  We can certainly discuss all of this at her next appointment.

## 2016-08-13 ENCOUNTER — Encounter: Payer: Self-pay | Admitting: Internal Medicine

## 2016-08-13 DIAGNOSIS — L03116 Cellulitis of left lower limb: Secondary | ICD-10-CM | POA: Diagnosis not present

## 2016-08-13 DIAGNOSIS — I129 Hypertensive chronic kidney disease with stage 1 through stage 4 chronic kidney disease, or unspecified chronic kidney disease: Secondary | ICD-10-CM | POA: Diagnosis not present

## 2016-08-13 DIAGNOSIS — E1122 Type 2 diabetes mellitus with diabetic chronic kidney disease: Secondary | ICD-10-CM | POA: Diagnosis not present

## 2016-08-13 DIAGNOSIS — I872 Venous insufficiency (chronic) (peripheral): Secondary | ICD-10-CM | POA: Diagnosis not present

## 2016-08-13 DIAGNOSIS — E1151 Type 2 diabetes mellitus with diabetic peripheral angiopathy without gangrene: Secondary | ICD-10-CM | POA: Diagnosis not present

## 2016-08-13 DIAGNOSIS — M1990 Unspecified osteoarthritis, unspecified site: Secondary | ICD-10-CM | POA: Diagnosis not present

## 2016-08-15 DIAGNOSIS — L03116 Cellulitis of left lower limb: Secondary | ICD-10-CM | POA: Diagnosis not present

## 2016-08-15 DIAGNOSIS — I872 Venous insufficiency (chronic) (peripheral): Secondary | ICD-10-CM | POA: Diagnosis not present

## 2016-08-15 DIAGNOSIS — I129 Hypertensive chronic kidney disease with stage 1 through stage 4 chronic kidney disease, or unspecified chronic kidney disease: Secondary | ICD-10-CM | POA: Diagnosis not present

## 2016-08-15 DIAGNOSIS — E1122 Type 2 diabetes mellitus with diabetic chronic kidney disease: Secondary | ICD-10-CM | POA: Diagnosis not present

## 2016-08-15 DIAGNOSIS — E1151 Type 2 diabetes mellitus with diabetic peripheral angiopathy without gangrene: Secondary | ICD-10-CM | POA: Diagnosis not present

## 2016-08-15 DIAGNOSIS — M1990 Unspecified osteoarthritis, unspecified site: Secondary | ICD-10-CM | POA: Diagnosis not present

## 2016-08-20 ENCOUNTER — Encounter: Payer: Self-pay | Admitting: Internal Medicine

## 2016-08-20 DIAGNOSIS — E1151 Type 2 diabetes mellitus with diabetic peripheral angiopathy without gangrene: Secondary | ICD-10-CM | POA: Diagnosis not present

## 2016-08-20 DIAGNOSIS — R600 Localized edema: Secondary | ICD-10-CM | POA: Diagnosis not present

## 2016-08-20 DIAGNOSIS — E1122 Type 2 diabetes mellitus with diabetic chronic kidney disease: Secondary | ICD-10-CM | POA: Diagnosis not present

## 2016-08-20 DIAGNOSIS — I129 Hypertensive chronic kidney disease with stage 1 through stage 4 chronic kidney disease, or unspecified chronic kidney disease: Secondary | ICD-10-CM | POA: Diagnosis not present

## 2016-08-20 DIAGNOSIS — L03116 Cellulitis of left lower limb: Secondary | ICD-10-CM | POA: Diagnosis not present

## 2016-08-20 DIAGNOSIS — Z01810 Encounter for preprocedural cardiovascular examination: Secondary | ICD-10-CM | POA: Diagnosis not present

## 2016-08-20 DIAGNOSIS — I872 Venous insufficiency (chronic) (peripheral): Secondary | ICD-10-CM | POA: Diagnosis not present

## 2016-08-20 DIAGNOSIS — R739 Hyperglycemia, unspecified: Secondary | ICD-10-CM | POA: Diagnosis not present

## 2016-08-20 DIAGNOSIS — I10 Essential (primary) hypertension: Secondary | ICD-10-CM | POA: Diagnosis not present

## 2016-08-20 DIAGNOSIS — M1990 Unspecified osteoarthritis, unspecified site: Secondary | ICD-10-CM | POA: Diagnosis not present
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
## 2016-08-22 DIAGNOSIS — M1990 Unspecified osteoarthritis, unspecified site: Secondary | ICD-10-CM | POA: Diagnosis not present

## 2016-08-22 DIAGNOSIS — I129 Hypertensive chronic kidney disease with stage 1 through stage 4 chronic kidney disease, or unspecified chronic kidney disease: Secondary | ICD-10-CM | POA: Diagnosis not present

## 2016-08-22 DIAGNOSIS — E1151 Type 2 diabetes mellitus with diabetic peripheral angiopathy without gangrene: Secondary | ICD-10-CM | POA: Diagnosis not present

## 2016-08-22 DIAGNOSIS — L03116 Cellulitis of left lower limb: Secondary | ICD-10-CM | POA: Diagnosis not present

## 2016-08-22 DIAGNOSIS — E1122 Type 2 diabetes mellitus with diabetic chronic kidney disease: Secondary | ICD-10-CM | POA: Diagnosis not present

## 2016-08-22 DIAGNOSIS — I872 Venous insufficiency (chronic) (peripheral): Secondary | ICD-10-CM | POA: Diagnosis not present

## 2016-08-23 ENCOUNTER — Other Ambulatory Visit: Payer: Self-pay | Admitting: Internal Medicine

## 2016-08-23 DIAGNOSIS — M25511 Pain in right shoulder: Principal | ICD-10-CM

## 2016-08-23 DIAGNOSIS — G8929 Other chronic pain: Secondary | ICD-10-CM

## 2016-08-26 DIAGNOSIS — M1990 Unspecified osteoarthritis, unspecified site: Secondary | ICD-10-CM | POA: Diagnosis not present

## 2016-08-26 DIAGNOSIS — E1122 Type 2 diabetes mellitus with diabetic chronic kidney disease: Secondary | ICD-10-CM | POA: Diagnosis not present

## 2016-08-26 DIAGNOSIS — L03116 Cellulitis of left lower limb: Secondary | ICD-10-CM | POA: Diagnosis not present

## 2016-08-26 DIAGNOSIS — E1151 Type 2 diabetes mellitus with diabetic peripheral angiopathy without gangrene: Secondary | ICD-10-CM | POA: Diagnosis not present

## 2016-08-26 DIAGNOSIS — I872 Venous insufficiency (chronic) (peripheral): Secondary | ICD-10-CM | POA: Diagnosis not present

## 2016-08-26 DIAGNOSIS — I129 Hypertensive chronic kidney disease with stage 1 through stage 4 chronic kidney disease, or unspecified chronic kidney disease: Secondary | ICD-10-CM | POA: Diagnosis not present

## 2016-08-27 ENCOUNTER — Encounter (INDEPENDENT_AMBULATORY_CARE_PROVIDER_SITE_OTHER): Payer: Self-pay | Admitting: Podiatry

## 2016-08-27 DIAGNOSIS — M1711 Unilateral primary osteoarthritis, right knee: Secondary | ICD-10-CM | POA: Diagnosis not present

## 2016-08-27 DIAGNOSIS — M170 Bilateral primary osteoarthritis of knee: Secondary | ICD-10-CM | POA: Diagnosis not present

## 2016-08-27 DIAGNOSIS — M25761 Osteophyte, right knee: Secondary | ICD-10-CM | POA: Diagnosis not present

## 2016-08-27 DIAGNOSIS — S83011A Lateral subluxation of right patella, initial encounter: Secondary | ICD-10-CM | POA: Diagnosis not present

## 2016-08-27 NOTE — Progress Notes (Signed)
This encounter was created in error - please disregard.

## 2016-08-29 DIAGNOSIS — E1122 Type 2 diabetes mellitus with diabetic chronic kidney disease: Secondary | ICD-10-CM | POA: Diagnosis not present

## 2016-08-29 DIAGNOSIS — E1151 Type 2 diabetes mellitus with diabetic peripheral angiopathy without gangrene: Secondary | ICD-10-CM | POA: Diagnosis not present

## 2016-08-29 DIAGNOSIS — I129 Hypertensive chronic kidney disease with stage 1 through stage 4 chronic kidney disease, or unspecified chronic kidney disease: Secondary | ICD-10-CM | POA: Diagnosis not present

## 2016-08-29 DIAGNOSIS — I872 Venous insufficiency (chronic) (peripheral): Secondary | ICD-10-CM | POA: Diagnosis not present

## 2016-08-29 DIAGNOSIS — L03116 Cellulitis of left lower limb: Secondary | ICD-10-CM | POA: Diagnosis not present

## 2016-08-29 DIAGNOSIS — M1990 Unspecified osteoarthritis, unspecified site: Secondary | ICD-10-CM | POA: Diagnosis not present

## 2016-09-03 DIAGNOSIS — E1122 Type 2 diabetes mellitus with diabetic chronic kidney disease: Secondary | ICD-10-CM | POA: Diagnosis not present

## 2016-09-03 DIAGNOSIS — I129 Hypertensive chronic kidney disease with stage 1 through stage 4 chronic kidney disease, or unspecified chronic kidney disease: Secondary | ICD-10-CM | POA: Diagnosis not present

## 2016-09-03 DIAGNOSIS — E1151 Type 2 diabetes mellitus with diabetic peripheral angiopathy without gangrene: Secondary | ICD-10-CM | POA: Diagnosis not present

## 2016-09-03 DIAGNOSIS — M1990 Unspecified osteoarthritis, unspecified site: Secondary | ICD-10-CM | POA: Diagnosis not present

## 2016-09-03 DIAGNOSIS — I872 Venous insufficiency (chronic) (peripheral): Secondary | ICD-10-CM | POA: Diagnosis not present

## 2016-09-03 DIAGNOSIS — L03116 Cellulitis of left lower limb: Secondary | ICD-10-CM | POA: Diagnosis not present

## 2016-09-05 ENCOUNTER — Other Ambulatory Visit: Payer: Self-pay | Admitting: Internal Medicine

## 2016-09-05 ENCOUNTER — Other Ambulatory Visit: Payer: Self-pay | Admitting: Orthopedic Surgery

## 2016-09-06 DIAGNOSIS — I129 Hypertensive chronic kidney disease with stage 1 through stage 4 chronic kidney disease, or unspecified chronic kidney disease: Secondary | ICD-10-CM | POA: Diagnosis not present

## 2016-09-06 DIAGNOSIS — I872 Venous insufficiency (chronic) (peripheral): Secondary | ICD-10-CM | POA: Diagnosis not present

## 2016-09-06 DIAGNOSIS — E1151 Type 2 diabetes mellitus with diabetic peripheral angiopathy without gangrene: Secondary | ICD-10-CM | POA: Diagnosis not present

## 2016-09-06 DIAGNOSIS — M1990 Unspecified osteoarthritis, unspecified site: Secondary | ICD-10-CM | POA: Diagnosis not present

## 2016-09-06 DIAGNOSIS — E1122 Type 2 diabetes mellitus with diabetic chronic kidney disease: Secondary | ICD-10-CM | POA: Diagnosis not present

## 2016-09-06 DIAGNOSIS — L03116 Cellulitis of left lower limb: Secondary | ICD-10-CM | POA: Diagnosis not present
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
## 2016-09-10 DIAGNOSIS — M1990 Unspecified osteoarthritis, unspecified site: Secondary | ICD-10-CM | POA: Diagnosis not present

## 2016-09-10 DIAGNOSIS — I129 Hypertensive chronic kidney disease with stage 1 through stage 4 chronic kidney disease, or unspecified chronic kidney disease: Secondary | ICD-10-CM | POA: Diagnosis not present

## 2016-09-10 DIAGNOSIS — L03116 Cellulitis of left lower limb: Secondary | ICD-10-CM | POA: Diagnosis not present

## 2016-09-10 DIAGNOSIS — E1122 Type 2 diabetes mellitus with diabetic chronic kidney disease: Secondary | ICD-10-CM | POA: Diagnosis not present

## 2016-09-10 DIAGNOSIS — I872 Venous insufficiency (chronic) (peripheral): Secondary | ICD-10-CM | POA: Diagnosis not present

## 2016-09-10 DIAGNOSIS — E1151 Type 2 diabetes mellitus with diabetic peripheral angiopathy without gangrene: Secondary | ICD-10-CM | POA: Diagnosis not present

## 2016-09-12 DIAGNOSIS — E1151 Type 2 diabetes mellitus with diabetic peripheral angiopathy without gangrene: Secondary | ICD-10-CM | POA: Diagnosis not present

## 2016-09-12 DIAGNOSIS — I129 Hypertensive chronic kidney disease with stage 1 through stage 4 chronic kidney disease, or unspecified chronic kidney disease: Secondary | ICD-10-CM | POA: Diagnosis not present

## 2016-09-12 DIAGNOSIS — I872 Venous insufficiency (chronic) (peripheral): Secondary | ICD-10-CM | POA: Diagnosis not present

## 2016-09-12 DIAGNOSIS — L03116 Cellulitis of left lower limb: Secondary | ICD-10-CM | POA: Diagnosis not present

## 2016-09-12 DIAGNOSIS — E1122 Type 2 diabetes mellitus with diabetic chronic kidney disease: Secondary | ICD-10-CM | POA: Diagnosis not present

## 2016-09-12 DIAGNOSIS — M1990 Unspecified osteoarthritis, unspecified site: Secondary | ICD-10-CM | POA: Diagnosis not present

## 2016-09-17 DIAGNOSIS — E1122 Type 2 diabetes mellitus with diabetic chronic kidney disease: Secondary | ICD-10-CM | POA: Diagnosis not present

## 2016-09-17 DIAGNOSIS — I129 Hypertensive chronic kidney disease with stage 1 through stage 4 chronic kidney disease, or unspecified chronic kidney disease: Secondary | ICD-10-CM | POA: Diagnosis not present

## 2016-09-17 DIAGNOSIS — M1990 Unspecified osteoarthritis, unspecified site: Secondary | ICD-10-CM | POA: Diagnosis not present

## 2016-09-17 DIAGNOSIS — I872 Venous insufficiency (chronic) (peripheral): Secondary | ICD-10-CM | POA: Diagnosis not present

## 2016-09-17 DIAGNOSIS — L03116 Cellulitis of left lower limb: Secondary | ICD-10-CM | POA: Diagnosis not present

## 2016-09-17 DIAGNOSIS — E1151 Type 2 diabetes mellitus with diabetic peripheral angiopathy without gangrene: Secondary | ICD-10-CM | POA: Diagnosis not present

## 2016-09-19 ENCOUNTER — Telehealth: Payer: Self-pay | Admitting: *Deleted

## 2016-09-19 NOTE — Telephone Encounter (Signed)
I Called patient daughter, Flora Lippsamela Banks regarding if patient was going to [facility] since she canceled her upcoming appointments  and she stated that patient is planning on moving on with another PCP and stated that she would take care of this and call [facility].

## 2016-09-19 NOTE — Telephone Encounter (Signed)
Gracie with [facility] called and stated that she needs verbal orders to recert patient for PT 2x4wks. Is verbal order ok? Last appointment 08/01/16.  Please Advise.

## 2016-09-19 NOTE — Telephone Encounter (Signed)
I have removed Dr. Hebert Sohoeeds name as pt's PCP.

## 2016-09-24 NOTE — Pre-Procedure Instructions (Signed)
JOSSLIN SANJUAN  09/24/2016      [facility] - [place], [place] - [address] [place] [address] Phone: [phone] Fax: [phone]  [facility] - [place], [place] - [address] [place] [address] Phone: [phone] Fax: [phone]  [facility] [address] [place], [place] - [address] [place] [address] Phone: [phone] Fax: [phone]    Your procedure is scheduled on August 6  Report to [facility] at 0800 A.M.  Call this number if you have problems the morning of surgery:  [phone]   Remember:  Do not eat food or drink liquids after midnight.   Take these medicines the morning of surgery with A SIP OF WATER levothyroxine (SYNTHROID, LEVOTHROID),   7 days prior to surgery STOP taking any Aleve, Naproxen, Ibuprofen, Motrin, Advil, Goody's, BC's, all herbal medications, fish oil, and all vitamins celecoxib (CELEBREX)  Follow your doctors instructions regarding your Aspirin.  If no instructions were given by the doctor you will need to call the office to get instructions.  Your pre admission RN will also call for those instructions    Do not wear jewelry, make-up or nail polish.  Do not wear lotions, powders, or perfumes, or deoderant.  Do not shave 48 hours prior to surgery.  Men may shave face and neck.  Do not bring valuables to the hospital.  [facility] is not responsible for any belongings or valuables.  Contacts, dentures or bridgework may not be worn into surgery.  Leave your suitcase in the car.  After surgery it may be brought to your room.  For patients admitted to the hospital, discharge time will be determined by your treatment team.  Patients discharged the day of surgery will not be allowed to drive home.    Special instructions:   [facility]- Preparing  For Surgery  Before surgery, you can play an important role. Because skin is not sterile, your skin needs to be as free of germs as possible. You can reduce the number of germs on your skin by washing with CHG (chlorahexidine gluconate) Soap before surgery.  CHG is an antiseptic cleaner which kills germs and bonds with the skin to continue killing germs even after washing.  Please do not use if you have an allergy to CHG or antibacterial soaps. If your skin becomes reddened/irritated stop using the CHG.  Do not shave (including legs and underarms) for at least 48 hours prior to first CHG shower. It is OK to shave your face.  Please follow these instructions carefully.   1. Shower the NIGHT BEFORE SURGERY and the MORNING OF SURGERY with CHG.   2. If you chose to wash your hair, wash your hair first as usual with your normal shampoo.  3. After you shampoo, rinse your hair and body thoroughly to remove the shampoo.  4. Use CHG as you would any other liquid soap. You can apply CHG directly to the skin and wash gently with a scrungie or a clean washcloth.   5. Apply the CHG Soap to your body ONLY FROM THE NECK DOWN.  Do not use on open wounds or open sores. Avoid contact with your eyes, ears, mouth and genitals (private parts). Wash genitals (private parts) with your normal soap.  6. Wash thoroughly, paying special attention to the area where your surgery  will be performed.  7. Thoroughly rinse your body with warm water from the neck down.  8. DO NOT shower/wash with your normal soap after using and rinsing off the CHG Soap.  9. Pat yourself dry with a CLEAN TOWEL.   10. Wear CLEAN PAJAMAS   11. Place CLEAN SHEETS on your bed the night of your first shower and DO NOT SLEEP WITH PETS.    Day of Surgery: Do not apply any deodorants/lotions. Please wear clean clothes to the hospital/surgery center.      Please read over the following fact sheets that you were given.

## 2016-09-25 ENCOUNTER — Encounter (HOSPITAL_COMMUNITY): Payer: Self-pay

## 2016-09-25 ENCOUNTER — Encounter (HOSPITAL_COMMUNITY)
Admission: RE | Admit: 2016-09-25 | Discharge: 2016-09-25 | Disposition: A | Discharge status: Home / Self Care | Payer: Medicare Other | Source: Ambulatory Visit | Attending: Orthopedic Surgery | Admitting: Orthopedic Surgery

## 2016-09-25 DIAGNOSIS — K219 Gastro-esophageal reflux disease without esophagitis: Secondary | ICD-10-CM | POA: Diagnosis not present

## 2016-09-25 DIAGNOSIS — M810 Age-related osteoporosis without current pathological fracture: Secondary | ICD-10-CM | POA: Diagnosis not present

## 2016-09-25 DIAGNOSIS — I872 Venous insufficiency (chronic) (peripheral): Secondary | ICD-10-CM | POA: Insufficient documentation

## 2016-09-25 DIAGNOSIS — D696 Thrombocytopenia, unspecified: Secondary | ICD-10-CM | POA: Diagnosis not present

## 2016-09-25 DIAGNOSIS — E039 Hypothyroidism, unspecified: Secondary | ICD-10-CM | POA: Insufficient documentation

## 2016-09-25 DIAGNOSIS — I10 Essential (primary) hypertension: Secondary | ICD-10-CM | POA: Insufficient documentation

## 2016-09-25 DIAGNOSIS — Z9889 Other specified postprocedural states: Secondary | ICD-10-CM | POA: Diagnosis not present

## 2016-09-25 DIAGNOSIS — I4510 Unspecified right bundle-branch block: Secondary | ICD-10-CM | POA: Diagnosis not present

## 2016-09-25 DIAGNOSIS — R269 Unspecified abnormalities of gait and mobility: Secondary | ICD-10-CM | POA: Diagnosis not present

## 2016-09-25 DIAGNOSIS — Z90710 Acquired absence of both cervix and uterus: Secondary | ICD-10-CM | POA: Diagnosis not present

## 2016-09-25 DIAGNOSIS — Z01818 Encounter for other preprocedural examination: Secondary | ICD-10-CM | POA: Insufficient documentation

## 2016-09-25 DIAGNOSIS — E119 Type 2 diabetes mellitus without complications: Secondary | ICD-10-CM | POA: Diagnosis not present

## 2016-09-25 DIAGNOSIS — R600 Localized edema: Secondary | ICD-10-CM | POA: Diagnosis not present

## 2016-09-25 DIAGNOSIS — Z7982 Long term (current) use of aspirin: Secondary | ICD-10-CM | POA: Insufficient documentation

## 2016-09-25 HISTORY — DX: Cardiomegaly: I51.7

## 2016-09-25 HISTORY — DX: Unspecified urinary incontinence: R32

## 2016-09-25 HISTORY — DX: Cellulitis, unspecified: L03.90

## 2016-09-25 HISTORY — DX: Unspecified hearing loss, unspecified ear: H91.90

## 2016-09-25 HISTORY — DX: Nontoxic goiter, unspecified: E04.9

## 2016-09-25 HISTORY — DX: Other amnesia: R41.3

## 2016-09-25 HISTORY — DX: Gastric ulcer, unspecified as acute or chronic, without hemorrhage or perforation: K25.9

## 2016-09-25 LAB — SURGICAL PCR SCREEN
MRSA, PCR: NEGATIVE
Staphylococcus aureus: NEGATIVE

## 2016-09-25 LAB — COMPREHENSIVE METABOLIC PANEL
ALBUMIN: 3.1 g/dL — AB (ref 3.5–5.0)
ALT: 13 U/L — ABNORMAL LOW (ref 14–54)
ANION GAP: 6 (ref 5–15)
AST: 25 U/L (ref 15–41)
Alkaline Phosphatase: 92 U/L (ref 38–126)
BUN: 34 mg/dL — ABNORMAL HIGH (ref 6–20)
CHLORIDE: 102 mmol/L (ref 101–111)
CO2: 28 mmol/L (ref 22–32)
Calcium: 9 mg/dL (ref 8.9–10.3)
Creatinine, Ser: 0.77 mg/dL (ref 0.44–1.00)
GFR calc Af Amer: >60 mL/min (ref >60)
GFR calc non Af Amer: >60 mL/min (ref >60)
GLUCOSE: 79 mg/dL (ref 65–99)
POTASSIUM: 4 mmol/L (ref 3.5–5.1)
SODIUM: 136 mmol/L (ref 135–145)
Total Bilirubin: 0.4 mg/dL (ref 0.3–1.2)
Total Protein: 7.2 g/dL (ref 6.5–8.1)

## 2016-09-25 LAB — CBC WITH DIFFERENTIAL/PLATELET
BASOS ABS: 0 10*3/uL (ref 0.0–0.1)
BASOS PCT: 0 %
EOS ABS: 0 10*3/uL (ref 0.0–0.7)
Eosinophils Relative: 1 %
HCT: 34 % — ABNORMAL LOW (ref 36.0–46.0)
Hemoglobin: 10.6 g/dL — ABNORMAL LOW (ref 12.0–15.0)
Lymphocytes Relative: 34 %
Lymphs Abs: 1.5 10*3/uL (ref 0.7–4.0)
MCH: 26.4 pg (ref 26.0–34.0)
MCHC: 31.2 g/dL (ref 30.0–36.0)
MCV: 84.6 fL (ref 78.0–100.0)
MONO ABS: 0.4 10*3/uL (ref 0.1–1.0)
MONOS PCT: 10 %
NEUTROS PCT: 55 %
Neutro Abs: 2.4 10*3/uL (ref 1.7–7.7)
Platelets: 184 10*3/uL (ref 150–400)
RBC: 4.02 MIL/uL (ref 3.87–5.11)
RDW: 16.4 % — AB (ref 11.5–15.5)
WBC: 4.4 10*3/uL (ref 4.0–10.5)

## 2016-09-25 LAB — GLUCOSE, CAPILLARY: GLUCOSE-CAPILLARY: 125 mg/dL — AB (ref 65–99)

## 2016-09-25 NOTE — Progress Notes (Addendum)
[facility] - patient is currently seeking a new PCP Cardiologist - Yates DecampJay Ganji  Chest x-ray - 10/23/15 EKG - 10/22/16 - patient saw Dr. Jacinto HalimGanji in June requesting that EKG Stress Test - unsure - requesting ECHO - unsure- requesting Cardiac Cath - denies    Fasting Blood Sugar - under 100 Checks Blood Sugar ___1__ times a day  Sending to anesthesia for review of records.    Patient voiced concerns regarding previous information in patient history reached out of office of patient experience.  Requested them to contact family and patient.  2:08 PM Office of patient experience called to say that they will reach out to the patient and family   Patient denies shortness of breath, fever, cough and chest pain at PAT appointment   Patient verbalized understanding of instructions that were given to them at the PAT appointment. Patient was also instructed that they will need to review over the PAT instructions again at home before surgery.

## 2016-09-25 NOTE — Progress Notes (Addendum)
Dr. Sherlean FootLucey office called to instruct patient to stop Aspirin 7 days prior to surgery  Called daughter left message

## 2016-09-26 LAB — HEMOGLOBIN A1C
Hgb A1c MFr Bld: 5.6 % (ref 4.8–5.6)
Mean Plasma Glucose: 114 mg/dL
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
## 2016-10-02 ENCOUNTER — Telehealth: Payer: Self-pay | Admitting: Hematology

## 2016-10-02 ENCOUNTER — Encounter: Payer: Self-pay | Admitting: Podiatry

## 2016-10-02 ENCOUNTER — Ambulatory Visit (INDEPENDENT_AMBULATORY_CARE_PROVIDER_SITE_OTHER): Payer: Medicare Other | Admitting: Podiatry

## 2016-10-02 DIAGNOSIS — B351 Tinea unguium: Secondary | ICD-10-CM | POA: Diagnosis not present

## 2016-10-02 DIAGNOSIS — M79676 Pain in unspecified toe(s): Secondary | ICD-10-CM | POA: Diagnosis not present

## 2016-10-02 DIAGNOSIS — E1142 Type 2 diabetes mellitus with diabetic polyneuropathy: Secondary | ICD-10-CM | POA: Diagnosis not present

## 2016-10-02 DIAGNOSIS — L84 Corns and callosities: Secondary | ICD-10-CM | POA: Diagnosis not present

## 2016-10-02 NOTE — Patient Instructions (Signed)

## 2016-10-02 NOTE — Progress Notes (Signed)
Patient ID: Jessica MunchPauline G Hawkins, female   DOB: Nov 17, 1922, 83 y.o.   MRN: [id number]    Subjective: This patient presents today for ongoing debridement of mycotic toenails and a painful keratoses on the distal third right toe. The patient's daughter and caregiver present to treatment today Patient has pending right knee replacement  Objective: Pleasant orientated 3 Pitting edema bilaterally, left greater than right Palpable pedal pulses bilaterally Sensation to 10 g monofilament wire intact 3/5 bilaterally Vibratory sensation reactive bilaterally Ankle reflexes reactive bilaterally HAV bilaterally Hammertoe 2-4 bilaterally The toenails are extremely elongated, brittle, deformed, discolored and tender to palpation 6-10 No open skin lesions bilaterally Atrophic skin with absent hair growth bilaterally Dorsi flexion, plantar flexion 5/5 right and 5/5 left Corn third right toewith dried blood within the corn. There is no active drainage from the corn on the distal third right toe.  Assessment: Type II diabetic with a history of peripheral arterial disease and diabetic peripheral neuropathy Hammertoe third left with pre-ulcerative corn distal third left toe Symptomatic onychomycoses 6-10   Plan: Debridement toenails 6-10 mechanically and electrically without any bleeding Debrided distal keratoses third right toe without any bleeding Dispensed toe crest for toes 2-4 right  Reappoint 3 months

## 2016-10-02 NOTE — Telephone Encounter (Signed)
Called patient's daughter Flora Lippsamela Banks to reschedule patient's appts per Elmarie Shileyiffany Daniel's 09/30/2016 scheduling message. Patient's daughter stated that they want to keep the doctor's appt time, but just want to schedule the lab a few days before. Scheduled patient's lab for Friday, December 06, 2016 at 10:30am.       [facility] 10/02/2016.

## 2016-10-03 ENCOUNTER — Ambulatory Visit: Payer: Medicare Other | Admitting: Internal Medicine

## 2016-10-04 MED ORDER — TRANEXAMIC ACID 1000 MG/10ML IV SOLN
1000.0000 mg | INTRAVENOUS | Status: AC
  Administered 2016-10-07: 1000 mg via INTRAVENOUS
  Filled 2016-10-04: qty 10

## 2016-10-06 ENCOUNTER — Encounter: Payer: Self-pay | Admitting: Internal Medicine

## 2016-10-06 NOTE — Progress Notes (Signed)
I received a letter from the Jessica Hawkins indicating that [name]' daughter, [name], had issued a complaint against me as a result of my documentation in her record on Aug 01, 2016.  This was my first visit with this patient when there were plans for me to assume her care following Dr. Chari ManningArthur Hawkins's retirement.  I was asked to evaluate her for surgical clearance for a knee replacement.  I reviewed the medical record present in the chart as of that day including the problem list as it stood at that time.  It included CAD, CHF and CKD 3, as well as a bleeding disorder.  When I saw Jessica Hawkins, as I documented, her cognitive status was quite concerning to me.  I questioned whether she had the ability to safely make the judgment about having this surgical procedure.  I also questioned her ability to recover well from the surgery by performing the necessary physical therapy.  I also was concerned about her physical debility and overall frailty contributing to a poor recovery. I was also, it turns out, under the incorrect impression about her other illnesses based on the medical record as it stood at that time.  I explained to the patient and her family during the appointment why I felt she was not a good surgical candidate.  At no time during the appointment was my opinion questioned by her daughter who was present that day.  She took extensive notes and I repeated my opinion numerous times.  Later, some phone calls were made to contact me and my medical assistant did return the call but the family did not call back.  They later came to the office in the middle of another clinic day requesting an explanation of this documentation.  Several staff, myself, and our office manager reviewed her historical chart in more detail.  Dr. Chilton Hawkins reached out to the family to explain the status of the problem list.  We were able to determine after actually receiving the notes from cardiology (which were not  in the chart at the time of the patient's appt--I was using hospital records and the problem list as it stood during the appt) that she did not actually have documented CHF or CAD.  No echocardiogram was even on file in Jessica Hawkins.  We also uncovered that Jessica Hawkins had seen hematology and determined not to have a bleeding disorder, but none of these diagnoses were ever removed from her problem list prior to her 08/01/16 appt.  I was under the impression that the issues from the family had been cleared when Dr. Chilton Hawkins spoke with them so I did not reach out to the family to further address the issue at that time.  In the hopes that this is not further pursued to [employer], I am trying to clarify the concerns in the patient's chart from my perspective.  It appears the patient is scheduled for her surgery tomorrow as she desires after Dr. Chilton Hawkins provided clearance for her and she has seen several other providers since who gave their opinions.    Jessica Hawkins L. Jessica Hawkins, D.O. [facility]iatric[facility] [address] [place], [place] [address] Cell Phone (Mon-Fri 8am-5pm):  [phone] On Call:  [phone] & follow prompts after 5pm & weekends Office Phone:  [phone] Office Fax:  [phone]
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
## 2016-10-07 ENCOUNTER — Inpatient Hospital Stay (HOSPITAL_COMMUNITY): Payer: Medicare Other | Admitting: Vascular Surgery

## 2016-10-07 ENCOUNTER — Inpatient Hospital Stay (HOSPITAL_COMMUNITY)
Admission: RE | Admit: 2016-10-07 | Discharge: 2016-10-09 | DRG: 470 | Disposition: A | Discharge status: Home Health | Payer: Medicare Other | Source: Ambulatory Visit | Attending: Orthopedic Surgery | Admitting: Orthopedic Surgery

## 2016-10-07 ENCOUNTER — Ambulatory Visit: Payer: Self-pay | Admitting: Orthopedic Surgery

## 2016-10-07 ENCOUNTER — Encounter (HOSPITAL_COMMUNITY): Payer: Self-pay | Admitting: *Deleted

## 2016-10-07 ENCOUNTER — Inpatient Hospital Stay (HOSPITAL_COMMUNITY): Payer: Medicare Other | Admitting: Anesthesiology

## 2016-10-07 ENCOUNTER — Encounter (HOSPITAL_COMMUNITY)
Admission: RE | Disposition: A | Discharge status: Home Health | Payer: Self-pay | Source: Ambulatory Visit | Attending: Orthopedic Surgery

## 2016-10-07 DIAGNOSIS — Z974 Presence of external hearing-aid: Secondary | ICD-10-CM | POA: Diagnosis not present

## 2016-10-07 DIAGNOSIS — E1151 Type 2 diabetes mellitus with diabetic peripheral angiopathy without gangrene: Secondary | ICD-10-CM | POA: Diagnosis present

## 2016-10-07 DIAGNOSIS — H9190 Unspecified hearing loss, unspecified ear: Secondary | ICD-10-CM | POA: Diagnosis present

## 2016-10-07 DIAGNOSIS — E1122 Type 2 diabetes mellitus with diabetic chronic kidney disease: Secondary | ICD-10-CM | POA: Diagnosis present

## 2016-10-07 DIAGNOSIS — I454 Nonspecific intraventricular block: Secondary | ICD-10-CM | POA: Diagnosis present

## 2016-10-07 DIAGNOSIS — M1711 Unilateral primary osteoarthritis, right knee: Secondary | ICD-10-CM | POA: Diagnosis not present

## 2016-10-07 DIAGNOSIS — G4730 Sleep apnea, unspecified: Secondary | ICD-10-CM | POA: Diagnosis present

## 2016-10-07 DIAGNOSIS — M170 Bilateral primary osteoarthritis of knee: Secondary | ICD-10-CM | POA: Diagnosis present

## 2016-10-07 DIAGNOSIS — E785 Hyperlipidemia, unspecified: Secondary | ICD-10-CM | POA: Diagnosis present

## 2016-10-07 DIAGNOSIS — G8918 Other acute postprocedural pain: Secondary | ICD-10-CM | POA: Diagnosis not present

## 2016-10-07 DIAGNOSIS — M810 Age-related osteoporosis without current pathological fracture: Secondary | ICD-10-CM | POA: Diagnosis present

## 2016-10-07 DIAGNOSIS — E559 Vitamin D deficiency, unspecified: Secondary | ICD-10-CM | POA: Diagnosis present

## 2016-10-07 DIAGNOSIS — I2510 Atherosclerotic heart disease of native coronary artery without angina pectoris: Secondary | ICD-10-CM | POA: Diagnosis present

## 2016-10-07 DIAGNOSIS — I872 Venous insufficiency (chronic) (peripheral): Secondary | ICD-10-CM | POA: Diagnosis present

## 2016-10-07 DIAGNOSIS — Z96652 Presence of left artificial knee joint: Secondary | ICD-10-CM | POA: Diagnosis present

## 2016-10-07 DIAGNOSIS — I129 Hypertensive chronic kidney disease with stage 1 through stage 4 chronic kidney disease, or unspecified chronic kidney disease: Secondary | ICD-10-CM | POA: Diagnosis present

## 2016-10-07 DIAGNOSIS — I10 Essential (primary) hypertension: Secondary | ICD-10-CM | POA: Diagnosis not present

## 2016-10-07 DIAGNOSIS — E039 Hypothyroidism, unspecified: Secondary | ICD-10-CM | POA: Diagnosis present

## 2016-10-07 DIAGNOSIS — K219 Gastro-esophageal reflux disease without esophagitis: Secondary | ICD-10-CM | POA: Diagnosis present

## 2016-10-07 DIAGNOSIS — D631 Anemia in chronic kidney disease: Secondary | ICD-10-CM | POA: Diagnosis present

## 2016-10-07 DIAGNOSIS — N189 Chronic kidney disease, unspecified: Secondary | ICD-10-CM | POA: Diagnosis present

## 2016-10-07 DIAGNOSIS — Z96659 Presence of unspecified artificial knee joint: Secondary | ICD-10-CM

## 2016-10-07 HISTORY — DX: Type 2 diabetes mellitus without complications: E11.9

## 2016-10-07 HISTORY — PX: TOTAL KNEE ARTHROPLASTY: SHX125

## 2016-10-07 LAB — GLUCOSE, CAPILLARY
Glucose-Capillary: 104 mg/dL — ABNORMAL HIGH (ref 65–99)
Glucose-Capillary: 110 mg/dL — ABNORMAL HIGH (ref 65–99)
Glucose-Capillary: 110 mg/dL — ABNORMAL HIGH (ref 65–99)

## 2016-10-07 SURGERY — ARTHROPLASTY, KNEE, TOTAL
Anesthesia: Spinal | Site: Knee | Laterality: Right

## 2016-10-07 MED ORDER — PROPOFOL 500 MG/50ML IV EMUL
INTRAVENOUS | Status: DC | PRN
  Administered 2016-10-07: 40 ug/kg/min via INTRAVENOUS

## 2016-10-07 MED ORDER — FOLIC ACID 1 MG PO TABS
1.0000 mg | ORAL_TABLET | Freq: Every day | ORAL | Status: DC
  Administered 2016-10-07 – 2016-10-09 (×3): 1 mg via ORAL
  Filled 2016-10-07 (×3): qty 1

## 2016-10-07 MED ORDER — BISACODYL 5 MG PO TBEC: 5.0000 mg | DELAYED_RELEASE_TABLET | Freq: Every day | ORAL | Status: DC | PRN

## 2016-10-07 MED ORDER — FLEET ENEMA 7-19 GM/118ML RE ENEM: 1.0000 | ENEMA | Freq: Once | RECTAL | Status: DC | PRN

## 2016-10-07 MED ORDER — SENNOSIDES-DOCUSATE SODIUM 8.6-50 MG PO TABS
1.0000 | ORAL_TABLET | Freq: Every evening | ORAL | Status: DC | PRN
Start: 2016-10-07 — End: 2016-10-09

## 2016-10-07 MED ORDER — METOCLOPRAMIDE HCL 5 MG/ML IJ SOLN: 5.0000 mg | Freq: Three times a day (TID) | INTRAMUSCULAR | Status: DC | PRN

## 2016-10-07 MED ORDER — BUPIVACAINE HCL (PF) 0.5 % IJ SOLN
INTRAMUSCULAR | Status: DC | PRN
  Administered 2016-10-07: 20 mL via PERINEURAL

## 2016-10-07 MED ORDER — AMLODIPINE BESYLATE 5 MG PO TABS
5.0000 mg | ORAL_TABLET | Freq: Every day | ORAL | Status: DC
  Administered 2016-10-07 – 2016-10-09 (×3): 5 mg via ORAL
  Filled 2016-10-07 (×3): qty 1

## 2016-10-07 MED ORDER — BUPIVACAINE-EPINEPHRINE (PF) 0.25% -1:200000 IJ SOLN
INTRAMUSCULAR | Status: DC | PRN
  Administered 2016-10-07: 30 mL

## 2016-10-07 MED ORDER — CHLORHEXIDINE GLUCONATE 4 % EX LIQD: 60.0000 mL | Freq: Once | CUTANEOUS | Status: DC

## 2016-10-07 MED ORDER — ONDANSETRON HCL 4 MG/2ML IJ SOLN: 4.0000 mg | Freq: Four times a day (QID) | INTRAMUSCULAR | Status: DC | PRN

## 2016-10-07 MED ORDER — PROPOFOL 1000 MG/100ML IV EMUL
INTRAVENOUS | Status: AC
  Filled 2016-10-07: qty 100

## 2016-10-07 MED ORDER — BUPIVACAINE-EPINEPHRINE (PF) 0.25% -1:200000 IJ SOLN
INTRAMUSCULAR | Status: AC
  Filled 2016-10-07: qty 30

## 2016-10-07 MED ORDER — METHOCARBAMOL 1000 MG/10ML IJ SOLN: 500.0000 mg | Freq: Four times a day (QID) | INTRAVENOUS | Status: DC | PRN

## 2016-10-07 MED ORDER — FENTANYL CITRATE (PF) 100 MCG/2ML IJ SOLN
25.0000 ug | Freq: Once | INTRAMUSCULAR | Status: AC
  Administered 2016-10-07: 25 ug via INTRAVENOUS

## 2016-10-07 MED ORDER — SODIUM CHLORIDE 0.9 % IR SOLN
Status: DC | PRN
  Administered 2016-10-07: 1000 mL
  Administered 2016-10-07: 3000 mL

## 2016-10-07 MED ORDER — MIDAZOLAM HCL 2 MG/2ML IJ SOLN
INTRAMUSCULAR | Status: AC
  Administered 2016-10-07: 1 mg via INTRAVENOUS
  Filled 2016-10-07: qty 2

## 2016-10-07 MED ORDER — GABAPENTIN 300 MG PO CAPS
300.0000 mg | ORAL_CAPSULE | Freq: Once | ORAL | Status: AC
  Administered 2016-10-07: 300 mg via ORAL
  Filled 2016-10-07: qty 1

## 2016-10-07 MED ORDER — METOCLOPRAMIDE HCL 5 MG PO TABS: 5.0000 mg | ORAL_TABLET | Freq: Three times a day (TID) | ORAL | Status: DC | PRN

## 2016-10-07 MED ORDER — TRAMADOL HCL 50 MG PO TABS
50.0000 mg | ORAL_TABLET | Freq: Four times a day (QID) | ORAL | Status: DC
  Administered 2016-10-07: 100 mg via ORAL
  Administered 2016-10-08 (×2): 50 mg via ORAL
  Filled 2016-10-07 (×2): qty 1
  Filled 2016-10-07: qty 2

## 2016-10-07 MED ORDER — ALUM & MAG HYDROXIDE-SIMETH 200-200-20 MG/5ML PO SUSP: 30.0000 mL | ORAL | Status: DC | PRN

## 2016-10-07 MED ORDER — ONDANSETRON HCL 4 MG/2ML IJ SOLN
INTRAMUSCULAR | Status: DC | PRN
Start: 2016-10-07 — End: 2016-10-07
  Administered 2016-10-07: 4 mg via INTRAVENOUS

## 2016-10-07 MED ORDER — DIPHENHYDRAMINE HCL 12.5 MG/5ML PO ELIX: 12.5000 mg | ORAL_SOLUTION | ORAL | Status: DC | PRN

## 2016-10-07 MED ORDER — PHENYLEPHRINE 40 MCG/ML (10ML) SYRINGE FOR IV PUSH (FOR BLOOD PRESSURE SUPPORT)
PREFILLED_SYRINGE | INTRAVENOUS | Status: AC
  Filled 2016-10-07: qty 10

## 2016-10-07 MED ORDER — SODIUM CHLORIDE 0.9 % IJ SOLN
INTRAMUSCULAR | Status: DC | PRN
  Administered 2016-10-07: 20 mL

## 2016-10-07 MED ORDER — DOCUSATE SODIUM 100 MG PO CAPS
100.0000 mg | ORAL_CAPSULE | Freq: Two times a day (BID) | ORAL | Status: DC
  Administered 2016-10-07 – 2016-10-09 (×4): 100 mg via ORAL
  Filled 2016-10-07 (×4): qty 1

## 2016-10-07 MED ORDER — ACETAMINOPHEN 650 MG RE SUPP: 650.0000 mg | Freq: Four times a day (QID) | RECTAL | Status: DC | PRN

## 2016-10-07 MED ORDER — CEFAZOLIN SODIUM-DEXTROSE 2-4 GM/100ML-% IV SOLN
2.0000 g | INTRAVENOUS | Status: AC
  Administered 2016-10-07: 2 g via INTRAVENOUS
  Filled 2016-10-07: qty 100

## 2016-10-07 MED ORDER — PHENOL 1.4 % MT LIQD: 1.0000 | OROMUCOSAL | Status: DC | PRN

## 2016-10-07 MED ORDER — LACTATED RINGERS IV SOLN
INTRAVENOUS | Status: DC
  Administered 2016-10-07 (×2): via INTRAVENOUS

## 2016-10-07 MED ORDER — CEFAZOLIN SODIUM-DEXTROSE 1-4 GM/50ML-% IV SOLN
1.0000 g | Freq: Three times a day (TID) | INTRAVENOUS | Status: AC
  Administered 2016-10-08 (×2): 1 g via INTRAVENOUS
  Filled 2016-10-07 (×4): qty 50

## 2016-10-07 MED ORDER — METHOCARBAMOL 500 MG PO TABS
500.0000 mg | ORAL_TABLET | Freq: Four times a day (QID) | ORAL | Status: DC | PRN
  Administered 2016-10-07 – 2016-10-08 (×3): 500 mg via ORAL
  Filled 2016-10-07 (×4): qty 1

## 2016-10-07 MED ORDER — BUPIVACAINE LIPOSOME 1.3 % IJ SUSP
INTRAMUSCULAR | Status: DC | PRN
  Administered 2016-10-07: 20 mL

## 2016-10-07 MED ORDER — MENTHOL 3 MG MT LOZG: 1.0000 | LOZENGE | OROMUCOSAL | Status: DC | PRN

## 2016-10-07 MED ORDER — RIVAROXABAN 10 MG PO TABS
10.0000 mg | ORAL_TABLET | Freq: Every day | ORAL | Status: DC
  Administered 2016-10-08 – 2016-10-09 (×2): 10 mg via ORAL
  Filled 2016-10-07 (×2): qty 1

## 2016-10-07 MED ORDER — DEXAMETHASONE SODIUM PHOSPHATE 10 MG/ML IJ SOLN
8.0000 mg | Freq: Once | INTRAMUSCULAR | Status: AC
  Administered 2016-10-07: 8 mg via INTRAVENOUS
  Filled 2016-10-07: qty 1

## 2016-10-07 MED ORDER — BUPIVACAINE LIPOSOME 1.3 % IJ SUSP
20.0000 mL | INTRAMUSCULAR | Status: DC
  Filled 2016-10-07: qty 20

## 2016-10-07 MED ORDER — FERROUS SULFATE 325 (65 FE) MG PO TABS
325.0000 mg | ORAL_TABLET | Freq: Three times a day (TID) | ORAL | Status: DC
  Administered 2016-10-08 – 2016-10-09 (×5): 325 mg via ORAL
  Filled 2016-10-07 (×5): qty 1

## 2016-10-07 MED ORDER — FENTANYL CITRATE (PF) 250 MCG/5ML IJ SOLN
INTRAMUSCULAR | Status: AC
  Filled 2016-10-07: qty 5

## 2016-10-07 MED ORDER — CELECOXIB 200 MG PO CAPS
200.0000 mg | ORAL_CAPSULE | Freq: Two times a day (BID) | ORAL | Status: DC
  Administered 2016-10-07 – 2016-10-09 (×4): 200 mg via ORAL
  Filled 2016-10-07 (×4): qty 1

## 2016-10-07 MED ORDER — FUROSEMIDE 40 MG PO TABS
40.0000 mg | ORAL_TABLET | Freq: Every day | ORAL | Status: DC
  Administered 2016-10-07 – 2016-10-09 (×3): 40 mg via ORAL
  Filled 2016-10-07 (×3): qty 1

## 2016-10-07 MED ORDER — HYDROCODONE-ACETAMINOPHEN 7.5-325 MG PO TABS
1.0000 | ORAL_TABLET | Freq: Four times a day (QID) | ORAL | Status: DC
  Administered 2016-10-07 – 2016-10-08 (×3): 1 via ORAL
  Filled 2016-10-07 (×3): qty 1

## 2016-10-07 MED ORDER — TRANEXAMIC ACID 1000 MG/10ML IV SOLN
1000.0000 mg | Freq: Once | INTRAVENOUS | Status: AC
  Administered 2016-10-08: 1000 mg via INTRAVENOUS
  Filled 2016-10-07 (×2): qty 10

## 2016-10-07 MED ORDER — PHENYLEPHRINE HCL 10 MG/ML IJ SOLN
INTRAMUSCULAR | Status: DC | PRN
  Administered 2016-10-07: 80 ug via INTRAVENOUS
  Administered 2016-10-07: 40 ug via INTRAVENOUS
  Administered 2016-10-07: 80 ug via INTRAVENOUS

## 2016-10-07 MED ORDER — PHENYLEPHRINE HCL 10 MG/ML IJ SOLN
INTRAVENOUS | Status: DC | PRN
  Administered 2016-10-07: 25 ug/min via INTRAVENOUS

## 2016-10-07 MED ORDER — FENTANYL CITRATE (PF) 100 MCG/2ML IJ SOLN
INTRAMUSCULAR | Status: AC
  Administered 2016-10-07: 25 ug via INTRAVENOUS
  Filled 2016-10-07: qty 2

## 2016-10-07 MED ORDER — MIDAZOLAM HCL 2 MG/2ML IJ SOLN
1.0000 mg | Freq: Once | INTRAMUSCULAR | Status: AC
  Administered 2016-10-07: 1 mg via INTRAVENOUS

## 2016-10-07 MED ORDER — ACETAMINOPHEN 500 MG PO TABS
1000.0000 mg | ORAL_TABLET | Freq: Once | ORAL | Status: AC
  Administered 2016-10-07: 1000 mg via ORAL
  Filled 2016-10-07: qty 2

## 2016-10-07 MED ORDER — ONDANSETRON HCL 4 MG PO TABS: 4.0000 mg | ORAL_TABLET | Freq: Four times a day (QID) | ORAL | Status: DC | PRN

## 2016-10-07 MED ORDER — ONDANSETRON HCL 4 MG/2ML IJ SOLN
INTRAMUSCULAR | Status: AC
  Filled 2016-10-07: qty 2

## 2016-10-07 MED ORDER — ACETAMINOPHEN 325 MG PO TABS
650.0000 mg | ORAL_TABLET | Freq: Four times a day (QID) | ORAL | Status: DC | PRN
  Administered 2016-10-08 – 2016-10-09 (×3): 650 mg via ORAL
  Filled 2016-10-07 (×3): qty 2

## 2016-10-07 MED ORDER — LEVOTHYROXINE SODIUM 25 MCG PO TABS
25.0000 ug | ORAL_TABLET | Freq: Every day | ORAL | Status: DC
  Administered 2016-10-08 – 2016-10-09 (×2): 25 ug via ORAL
  Filled 2016-10-07 (×2): qty 1

## 2016-10-07 SURGICAL SUPPLY — 64 items
BANDAGE ACE 6X5 VEL STRL LF (GAUZE/BANDAGES/DRESSINGS) ×2 IMPLANT
BANDAGE ESMARK 6X9 LF (GAUZE/BANDAGES/DRESSINGS) ×1 IMPLANT
BLADE SAGITTAL 13X1.27X60 (BLADE) ×2 IMPLANT
BLADE SAW SGTL 83.5X18.5 (BLADE) ×2 IMPLANT
BLADE SURG 10 STRL SS (BLADE) ×2 IMPLANT
BNDG CMPR 9X6 STRL LF SNTH (GAUZE/BANDAGES/DRESSINGS) ×1
BNDG ESMARK 6X9 LF (GAUZE/BANDAGES/DRESSINGS) ×2
BOWL SMART MIX CTS (DISPOSABLE) ×2 IMPLANT
CAPT KNEE TOTAL 3 ×2 IMPLANT
CEMENT BONE SIMPLEX SPEEDSET (Cement) ×4 IMPLANT
COVER SURGICAL LIGHT HANDLE (MISCELLANEOUS) ×2 IMPLANT
CUFF TOURNIQUET SINGLE 34IN LL (TOURNIQUET CUFF) ×2 IMPLANT
DRAPE EXTREMITY T 121X128X90 (DRAPE) ×2 IMPLANT
DRAPE HALF SHEET 40X57 (DRAPES) ×2 IMPLANT
DRAPE INCISE IOBAN 66X45 STRL (DRAPES) ×4 IMPLANT
DRAPE U-SHAPE 47X51 STRL (DRAPES) ×2 IMPLANT
DRSG AQUACEL AG ADV 3.5X10 (GAUZE/BANDAGES/DRESSINGS) ×2 IMPLANT
DURAPREP 26ML APPLICATOR (WOUND CARE) ×4 IMPLANT
ELECT REM PT RETURN 9FT ADLT (ELECTROSURGICAL) ×2
ELECTRODE REM PT RTRN 9FT ADLT (ELECTROSURGICAL) ×1 IMPLANT
FILTER STRAW FLUID ASPIR (MISCELLANEOUS) IMPLANT
GLOVE BIOGEL M 7.0 STRL (GLOVE) IMPLANT
GLOVE BIOGEL PI IND STRL 6.5 (GLOVE) IMPLANT
GLOVE BIOGEL PI IND STRL 7.5 (GLOVE) IMPLANT
GLOVE BIOGEL PI IND STRL 8.5 (GLOVE) ×1 IMPLANT
GLOVE BIOGEL PI INDICATOR 6.5 (GLOVE) ×2
GLOVE BIOGEL PI INDICATOR 7.5 (GLOVE)
GLOVE BIOGEL PI INDICATOR 8.5 (GLOVE) ×1
GLOVE ECLIPSE 6.5 STRL STRAW (GLOVE) ×1 IMPLANT
GLOVE SURG ORTHO 8.0 STRL STRW (GLOVE) ×4 IMPLANT
GLOVE SURG SS PI 6.5 STRL IVOR (GLOVE) ×1 IMPLANT
GOWN STRL REUS W/ TWL LRG LVL3 (GOWN DISPOSABLE) ×1 IMPLANT
GOWN STRL REUS W/ TWL XL LVL3 (GOWN DISPOSABLE) ×2 IMPLANT
GOWN STRL REUS W/TWL 2XL LVL3 (GOWN DISPOSABLE) ×2 IMPLANT
GOWN STRL REUS W/TWL LRG LVL3 (GOWN DISPOSABLE) ×2
GOWN STRL REUS W/TWL XL LVL3 (GOWN DISPOSABLE) ×4
HANDPIECE INTERPULSE COAX TIP (DISPOSABLE) ×2
HOOD PEEL AWAY FACE SHEILD DIS (HOOD) ×6 IMPLANT
KIT BASIN OR (CUSTOM PROCEDURE TRAY) ×2 IMPLANT
KIT ROOM TURNOVER OR (KITS) ×2 IMPLANT
KNEE CAPITATED TOTAL 3 IMPLANT
MANIFOLD NEPTUNE II (INSTRUMENTS) ×2 IMPLANT
NDL 18GX1X1/2 (RX/OR ONLY) (NEEDLE) IMPLANT
NEEDLE 18GX1X1/2 (RX/OR ONLY) (NEEDLE) IMPLANT
NEEDLE 22X1 1/2 (OR ONLY) (NEEDLE) ×4 IMPLANT
NS IRRIG 1000ML POUR BTL (IV SOLUTION) ×2 IMPLANT
PACK TOTAL JOINT (CUSTOM PROCEDURE TRAY) ×2 IMPLANT
PAD ARMBOARD 7.5X6 YLW CONV (MISCELLANEOUS) ×4 IMPLANT
SET HNDPC FAN SPRY TIP SCT (DISPOSABLE) ×1 IMPLANT
STRIP CLOSURE SKIN 1/2X4 (GAUZE/BANDAGES/DRESSINGS) ×2 IMPLANT
SUCTION FRAZIER HANDLE 10FR (MISCELLANEOUS)
SUCTION TUBE FRAZIER 10FR DISP (MISCELLANEOUS) IMPLANT
SUT MNCRL AB 3-0 PS2 18 (SUTURE) ×2 IMPLANT
SUT VIC AB 0 CTB1 27 (SUTURE) ×4 IMPLANT
SUT VIC AB 1 CT1 27 (SUTURE) ×4
SUT VIC AB 1 CT1 27XBRD ANBCTR (SUTURE) ×2 IMPLANT
SUT VIC AB 2-0 CT1 27 (SUTURE) ×4
SUT VIC AB 2-0 CT1 TAPERPNT 27 (SUTURE) ×2 IMPLANT
SYR 20CC LL (SYRINGE) ×4 IMPLANT
SYR TB 1ML LUER SLIP (SYRINGE) IMPLANT
TOWEL OR 17X24 6PK STRL BLUE (TOWEL DISPOSABLE) ×2 IMPLANT
TOWEL OR 17X26 10 PK STRL BLUE (TOWEL DISPOSABLE) ×2 IMPLANT
TRAY CATH 16FR W/PLASTIC CATH (SET/KITS/TRAYS/PACK) IMPLANT
WRAP KNEE MAXI GEL POST OP (GAUZE/BANDAGES/DRESSINGS) ×2 IMPLANT

## 2016-10-07 NOTE — Anesthesia Preprocedure Evaluation (Signed)
Anesthesia Evaluation  Patient identified by MRN, date of birth, ID band Patient awake    Reviewed: Allergy & Precautions, H&P , Patient's Chart, lab work & pertinent test results  Airway Mallampati: II  TM Distance: >3 FB Neck ROM: full    Dental no notable dental hx.    Pulmonary    Pulmonary exam normal breath sounds clear to auscultation       Cardiovascular Exercise Tolerance: Good hypertension,  Rhythm:regular Rate:Normal     Neuro/Psych    GI/Hepatic   Endo/Other  diabetes  Renal/GU      Musculoskeletal   Abdominal   Peds  Hematology   Anesthesia Other Findings Hypertension      GERD (gastroesophageal reflux disease)    Hypothyroid HTN (hypertension)   Platelet disorder (HCC)    Von Willebrand Dz ruled out per Dr Candise CheKale Hyperlipidemia Diabetes mellitus without complication (HCC)   Edema BBB (bundle branch block)   Unspecified sleep apnea Coronary atherosclerosis of unspecified type    Diahragmatic hernia     Reproductive/Obstetrics                             Anesthesia Physical  Anesthesia Plan  ASA: III  Anesthesia Plan: Spinal   Post-op Pain Management:    Induction:   PONV Risk Score and Plan:   Airway Management Planned:   Additional Equipment:   Intra-op Plan:   Post-operative Plan:   Informed Consent: I have reviewed the patients History and Physical, chart, labs and discussed the procedure including the risks, benefits and alternatives for the proposed anesthesia with the patient or authorized representative who has indicated his/her understanding and acceptance.   Dental Advisory Given  Plan Discussed with: CRNA  Anesthesia Plan Comments: (Lab work confirmed with CRNA in room. Platelets okay. Discussed spinal anesthetic, and patient consents to the procedure:  included risk of possible headache,backache, failed block, allergic reaction, and  nerve injury. This patient was asked if she had any questions or concerns before the procedure started. )        Anesthesia Quick Evaluation

## 2016-10-07 NOTE — H&P (Signed)
H & P Update:  H & P by PA reviewed from yesterday. Pt examined.  No changes or updates.  Stephen D. ShMila Homererlean FootLucey, MD

## 2016-10-07 NOTE — Anesthesia Procedure Notes (Signed)
Procedure Name: MAC Date/Time: 10/07/2016 11:35 AM Performed by: Candis Shine Pre-anesthesia Checklist: Patient identified, Emergency Drugs available, Suction available, Patient being monitored and Timeout performed Patient Re-evaluated:Patient Re-evaluated prior to induction Oxygen Delivery Method: Simple face mask Dental Injury: Teeth and Oropharynx as per pre-operative assessment

## 2016-10-07 NOTE — Progress Notes (Signed)
Orthopedic Tech Progress Note Patient Details:  Jessica Hawkins 11-Apr-1922 [id number]  CPM Right Knee CPM Right Knee: On Right Knee Flexion (Degrees): 90 Right Knee Extension (Degrees): 0 Additional Comments: foot roll   Saul FordyceJennifer C Fong Mccarry 10/07/2016, 2:40 PM

## 2016-10-07 NOTE — Anesthesia Procedure Notes (Signed)
Spinal  Patient location during procedure: OR Staffing Anesthesiologist: Jessica Hawkins, Jessica Hawkins Preanesthetic Checklist Completed: patient identified, site marked, surgical consent, pre-op evaluation, timeout performed, IV checked, risks and benefits discussed and monitors and equipment checked Spinal Block Patient position: sitting Prep: DuraPrep Patient monitoring: heart rate, cardiac monitor, continuous pulse ox and blood pressure Approach: midline Location: L3-4 Injection technique: single-shot Needle Needle type: Sprotte  Needle gauge: 24 G Needle length: 9 cm Assessment Sensory level: T4 Additional Notes Markedly scoliotic to R + CSF after injection 2.1cc .5% Bupiv

## 2016-10-07 NOTE — Transfer of Care (Signed)
Immediate Anesthesia Transfer of Care Note  Patient: Jessica Hawkins  Procedure(s) Performed: Procedure(s): TOTAL KNEE ARTHROPLASTY (Right)  Patient Location: PACU  Anesthesia Type:Spinal and MAC combined with regional for post-op pain  Level of Consciousness: drowsy  Airway & Oxygen Therapy: Patient Spontanous Breathing and Patient connected to face mask oxygen  Post-op Assessment: Report given to RN and Post -op Vital signs reviewed and stable  Post vital signs: Reviewed and stable  Last Vitals:  Vitals:   10/07/16 0940 10/07/16 0945  BP: (!) 142/47 (!) 146/47  Pulse: 60 64  Resp: 14 12  Temp:      Last Pain:  Vitals:   10/07/16 0830  TempSrc: Oral      Patients Stated Pain Goal: 2 (12/18/49 0258)  Complications: No apparent anesthesia complications

## 2016-10-07 NOTE — Anesthesia Procedure Notes (Signed)
Anesthesia Regional Block: Adductor canal block   Pre-Anesthetic Checklist: ,, timeout performed, Correct Patient, Correct Site, Correct Laterality, Correct Procedure, Correct Position, site marked, Risks and benefits discussed, pre-op evaluation,  At surgeon's request and post-op pain management  Laterality: Right  Prep: chloraprep       Needles:   Needle Type: Echogenic Needle     Needle Length: 9cm  Needle Gauge: 21     Additional Needles:   Procedures: ultrasound guided,,,,,,,,  Narrative:  Start time: 10/07/2016 9:32 AM End time: 10/07/2016 9:35 AM Injection made incrementally with aspirations every 5 mL. Anesthesiologist: Cristela BlueJACKSON, Henlee Donovan

## 2016-10-08 ENCOUNTER — Encounter (HOSPITAL_COMMUNITY): Payer: Self-pay | Admitting: Orthopedic Surgery

## 2016-10-08 LAB — CBC
HEMATOCRIT: 29.9 % — AB (ref 36.0–46.0)
HEMOGLOBIN: 9.3 g/dL — AB (ref 12.0–15.0)
MCH: 26.5 pg (ref 26.0–34.0)
MCHC: 31.1 g/dL (ref 30.0–36.0)
MCV: 85.2 fL (ref 78.0–100.0)
Platelets: 161 10*3/uL (ref 150–400)
RBC: 3.51 MIL/uL — AB (ref 3.87–5.11)
RDW: 15 % (ref 11.5–15.5)
WBC: 8.7 10*3/uL (ref 4.0–10.5)

## 2016-10-08 LAB — GLUCOSE, CAPILLARY
GLUCOSE-CAPILLARY: 118 mg/dL — AB (ref 65–99)
GLUCOSE-CAPILLARY: 176 mg/dL — AB (ref 65–99)
Glucose-Capillary: 132 mg/dL — ABNORMAL HIGH (ref 65–99)
Glucose-Capillary: 185 mg/dL — ABNORMAL HIGH (ref 65–99)

## 2016-10-08 LAB — BASIC METABOLIC PANEL
ANION GAP: 7 (ref 5–15)
BUN: 34 mg/dL — ABNORMAL HIGH (ref 6–20)
CALCIUM: 8.6 mg/dL — AB (ref 8.9–10.3)
CHLORIDE: 100 mmol/L — AB (ref 101–111)
CO2: 28 mmol/L (ref 22–32)
Creatinine, Ser: 0.96 mg/dL (ref 0.44–1.00)
GFR calc non Af Amer: 50 mL/min — ABNORMAL LOW (ref >60)
GFR, EST AFRICAN AMERICAN: 58 mL/min — AB (ref >60)
Glucose, Bld: 117 mg/dL — ABNORMAL HIGH (ref 65–99)
POTASSIUM: 4.7 mmol/L (ref 3.5–5.1)
Sodium: 135 mmol/L (ref 135–145)

## 2016-10-08 MED ORDER — HYDROCODONE-ACETAMINOPHEN 7.5-325 MG PO TABS
1.0000 | ORAL_TABLET | Freq: Four times a day (QID) | ORAL | Status: DC | PRN
  Filled 2016-10-08: qty 1

## 2016-10-08 MED ORDER — RIVAROXABAN 10 MG PO TABS: 10.0000 mg | ORAL_TABLET | Freq: Every day | ORAL | 0 refills | Status: DC

## 2016-10-08 MED ORDER — TRAMADOL HCL 50 MG PO TABS: 50.0000 mg | ORAL_TABLET | Freq: Four times a day (QID) | ORAL | 0 refills | Status: DC

## 2016-10-08 MED ORDER — TRAMADOL HCL 50 MG PO TABS
50.0000 mg | ORAL_TABLET | Freq: Four times a day (QID) | ORAL | Status: DC
  Administered 2016-10-08 – 2016-10-09 (×4): 50 mg via ORAL
  Filled 2016-10-08 (×4): qty 1

## 2016-10-08 MED ORDER — METHOCARBAMOL 500 MG PO TABS: 500.0000 mg | ORAL_TABLET | Freq: Four times a day (QID) | ORAL | 0 refills | Status: DC | PRN

## 2016-10-08 MED ORDER — HYDROCODONE-ACETAMINOPHEN 7.5-325 MG PO TABS: 1.0000 | ORAL_TABLET | Freq: Four times a day (QID) | ORAL | 0 refills | Status: DC

## 2016-10-08 MED ORDER — ENSURE ENLIVE PO LIQD
237.0000 mL | Freq: Two times a day (BID) | ORAL | Status: DC
  Administered 2016-10-08 – 2016-10-09 (×2): 237 mL via ORAL

## 2016-10-08 NOTE — Progress Notes (Signed)
SPORTS MEDICINE AND JOINT REPLACEMENT  Georgena SpurlingStephen Lucey, MD    Laurier Nancyolby Paetyn Pietrzak, PA-C [address], [place], [place]  [id number]                             [phone]   PROGRESS NOTE  Subjective:  negative for Chest Pain  negative for Shortness of Breath  negative for Nausea/Vomiting   negative for Calf Pain  negative for Bowel Movement   Tolerating Diet: yes         Patient reports pain as 3 on 0-10 scale.    Objective: Vital signs in last 24 hours:   Patient Vitals for the past 24 hrs:  BP Temp Temp src Pulse Resp SpO2 Height Weight  10/08/16 0538 (!) 129/53 98.3 F (36.8 C) Oral 64 15 100 % - -  10/08/16 0025 (!) 123/50 97.9 F (36.6 C) Axillary (!) 53 16 100 % - -  10/07/16 2030 (!) 116/55 98.3 F (36.8 C) Oral (!) 57 18 100 % - -  10/07/16 1758 (!) 151/51 98.2 F (36.8 C) Oral (!) 56 20 - - -  10/07/16 1720 - 98 F (36.7 C) - - - - - -  10/07/16 1715 - - - (!) 52 11 100 % - -  10/07/16 1711 (!) 126/53 - - (!) 58 15 100 % - -  10/07/16 1700 - - - (!) 54 11 100 % - -  10/07/16 1656 (!) 134/59 - - 61 14 100 % - -  10/07/16 1645 - - - (!) 53 11 100 % - -  10/07/16 1640 (!) 138/52 - - (!) 52 11 100 % - -  10/07/16 1630 - 98 F (36.7 C) - (!) 55 12 100 % - -  10/07/16 1625 (!) 142/56 - - (!) 51 11 100 % - -  10/07/16 1615 - - - (!) 56 15 100 % - -  10/07/16 1611 (!) 140/58 - - (!) 55 16 100 % - -  10/07/16 1600 - - - (!) 53 11 100 % - -  10/07/16 1555 (!) 138/51 - - (!) 53 12 100 % - -  10/07/16 1545 - - - (!) 51 (!) 8 100 % - -  10/07/16 1541 (!) 144/52 - - (!) 51 (!) 9 100 % - -  10/07/16 1530 - - - (!) 57 (!) 9 100 % - -  10/07/16 1525 (!) 137/54 - - 61 14 100 % - -  10/07/16 1515 - - - (!) 58 10 93 % - -  10/07/16 1510 (!) 126/48 - - (!) 56 10 94 % - -  10/07/16 1500 - - - (!) 57 11 94 % - -  10/07/16 1455 (!) 130/50 - - (!) 57 12 94 % - -  10/07/16 1445 - - - (!) 55 12 97 % - -  10/07/16 1440 (!) 126/55 - - 60 (!) 25 98 % - -  10/07/16 1430 - - - (!) 55  12 95 % - -  10/07/16 1425 (!) 121/52 - - (!) 55 12 97 % - -  10/07/16 1415 - - - (!) 57 12 95 % - -  10/07/16 1410 (!) 129/55 - - (!) 59 12 95 % - -  10/07/16 1400 - - - (!) 56 13 98 % - -  10/07/16 1356 - - - (!) 57 13 100 % - -  10/07/16 1355 Marland Kitchen(!)  119/54 (!) 97.5 F (36.4 C) - - - - - -  10/07/16 0945 (!) 146/47 - - 64 12 100 % - -  10/07/16 0940 (!) 142/47 - - 60 14 100 % - -  10/07/16 0935 (!) 131/47 - - (!) 58 13 100 % - -  10/07/16 0930 (!) 156/47 - - 60 13 100 % - -  10/07/16 0925 (!) 197/49 - - 65 13 100 % - -  10/07/16 0920 (!) 165/53 - - 65 11 100 % - -  10/07/16 0855 - - - - - - 5' (1.524 m) 62.6 kg (138 lb)  10/07/16 0830 (!) 182/53 98.1 F (36.7 C) Oral 63 20 99 % - -    @flow {1959:LAST@   Intake/Output from previous day:   08/06 0701 - 08/07 0700 In: 1450 [P.O.:150; I.V.:1250] Out: 1300 [Urine:1250]   Intake/Output this shift:   No intake/output data recorded.   Intake/Output      08/06 0701 - 08/07 0700 08/07 0701 - 08/08 0700   P.O. 150    I.V. (mL/kg) 1250 (20)    IV Piggyback 50    Total Intake(mL/kg) 1450 (23.2)    Urine (mL/kg/hr) 1250    Blood 50    Total Output 1300     Net +150             LABORATORY DATA: No results for input(s): WBC, HGB, HCT, PLT in the last 168 hours. No results for input(s): NA, K, CL, CO2, BUN, CREATININE, GLUCOSE, CALCIUM in the last 168 hours. Lab Results  Component Value Date   INR 1.19 07/15/2016   INR 1.1 05/22/2015   INR 1.10 05/19/2015    Examination:  General appearance: alert, cooperative and no distress Extremities: extremities normal, atraumatic, no cyanosis or edema  Wound Exam: clean, dry, intact   Drainage:  None: wound tissue dry  Motor Exam: Quadriceps and Hamstrings Intact  Sensory Exam: Superficial Peroneal, Deep Peroneal and Tibial normal   Assessment:    1 Day Post-Op  Procedure(s) (LRB): TOTAL KNEE ARTHROPLASTY (Right)  ADDITIONAL DIAGNOSIS:  Active Problems:   S/P total knee  replacement     Plan: Physical Therapy as ordered Weight Bearing as Tolerated (WBAT)  DVT Prophylaxis:  Xarelto  DISCHARGE PLAN: Home  DISCHARGE NEEDS: HHPT   Patient doing very well, will continue to monitor progress with diet and PT. Discharge probably Wednesday or thursday         Guy Sandifer 10/08/2016, 7:10 AM

## 2016-10-08 NOTE — Progress Notes (Signed)
Transitions of Care - Pain Medication Review for Discharge to Home  Patient Jessica Hawkins is a 83 YO F hospitalized on 10/06/16, s/p left TKA.   PTA, patient does not take any opioid pain medications.  Pertinent co-morbid conditions include osteoporosis, recurrent left lower limb cellulitis, poor memory/cognition, and T2DM.  CrCl=31.5 ml/min (using Cockcroft-Gault equation).   Other pertinent PTA medications include: -celecoxib 200 mg po BID -acetaminophen 650 mg po q6h prn -OTC Aspercreme w/ lidocaine 4% cream applied QID to the right shoulder.   [place] CSRS reviewed on 10/08/16 and shows that she has not filled a prescription for a controlled substance in the past 6 months.   Upon questioning, patient had just completed physical therapy and rated her pain as a 9/10. Current in-patient opioids include hydrocodone-acetaminophen 7.5-325 mg po q6h prn and scheduled tramadol 50 po q6h. In the past 24 hours, she has received three doses of the Norco and 3 doses of the tramadol ( one 100 mg and two 50 mg), which is equivalent to 42.5 mg of morphine.   Non-opioid pain management includes acetaminophen 650 mg po q6h, which the patient has received one dose of this AM.   Mobilization makes her pain worse.  Repositioning makes her pain better.   States that pain is located in her L leg.  Patient states that their pain goal is a 2.  Upon questioning, patient seems alert, but complains of drowsiness. Is slightly confused, but that seems to be her baseline.   --------------------------------------------------------------  Recommendation:  1. Consider tramadol 50 mg po q6h x 7 days #30 2. Consider hydrocodone-acetaminophen 7.5-325 1 tab po q6h prn #30. Instruct patient to taper using the following regimen:   - Days 1-4: 1 tab x 4 doses  - Days 5-7: 1 tab x 3 doses  - Days 8-10: 1 tab x 3 doses   - Goal to stop by day 10.  3. Continue celecoxib 200 mg po bid  Counseling provided:  Interviewed  patient to understand her pain needs and understand her PTA medications. Provided education about what pain medications she is being given and their use.

## 2016-10-08 NOTE — Evaluation (Signed)
Occupational Therapy Evaluation Patient Details Name: Jessica Hawkins MRN: [id number] DOB: Mar 09, 1922 Today's Date: 10/08/2016    History of Present Illness Pt is a 83 yo female admitted 10/07/2016 for elective R TKA. PMH: cellulitis, recurrent UTI, cognitive impairment PSH: L TKA.   Clinical Impression   This 83 y/o F presents with the above. Per chart review, Pt receives assistance for ADL completion at baseline including bathing and dressing and was mod independent with functional mobility using RW. Pt currently requires ModA for functional mobility, MaxA for LB ADLs. Pt requires increased time and multimodal cues for completing functional mobility using RW. Pt will benefit from continued OT services while in acute setting and feel Pt will benefit from additional OT services after return home to maximize Pt's overall safety and independence with ADLs and functional mobility.     Follow Up Recommendations  Home health OT;Supervision/Assistance - 24 hour    Equipment Recommendations  None recommended by OT           Precautions / Restrictions Precautions Precautions: Knee;Fall Precaution Booklet Issued: No Precaution Comments: verbally reviewed knee precautions with Pt Restrictions Weight Bearing Restrictions: Yes RLE Weight Bearing: Weight bearing as tolerated      Mobility Bed Mobility Overal bed mobility: Needs Assistance Bed Mobility: Sit to Supine       Sit to supine: Mod assist   General bed mobility comments: assist to bring LEs onto bed, assist for RLE magnement while scooting to [facility]   Transfers Overall transfer level: Needs assistance Equipment used: Rolling walker (2 wheeled) Transfers: Sit to/from [employer] Sit to Stand: Mod assist Stand pivot transfers: Mod assist       General transfer comment: Max directional verbal/tactile cues, ModA to rise to standing, to steady and to maintain standing posture during stand pivot transfer     Balance Overall balance assessment: Needs assistance Sitting-balance support: Feet supported;Single extremity supported Sitting balance-Leahy Scale: Fair Sitting balance - Comments: sitting EOB prior to return to supine with MinGuard assist    Standing balance support: Bilateral upper extremity supported Standing balance-Leahy Scale: Poor Standing balance comment: dependent on RW and physical assist                           ADL either performed or assessed with clinical judgement   ADL Overall ADL's : Needs assistance/impaired Eating/Feeding: Set up;Sitting   Grooming: Sitting;Set up;Supervision/safety   Upper Body Bathing: Minimal assistance;Sitting   Lower Body Bathing: Moderate assistance;Sit to/from stand   Upper Body Dressing : Minimal assistance;Sitting   Lower Body Dressing: Maximal assistance;Sit to/from stand   Toilet Transfer: Moderate assistance;Stand-pivot;BSC;RW Toilet Transfer Details (indicate cue type and reason): required significantly increased time and multimodal cues for sequencing stand-pivot transfer to [facility]  Toileting- Clothing Manipulation and Hygiene: Moderate assistance;Sit to/from stand Toileting - Clothing Manipulation Details (indicate cue type and reason): assist for gown management; Pt required increased time and effort however unable to void bladder during session      Functional mobility during ADLs: Moderate assistance;Rolling walker General ADL Comments: Pt requires increased time and multimodal cues to sequence RW use for stand pivot transfers                         Pertinent Vitals/Pain Pain Assessment: Faces Faces Pain Scale: Hurts little more Pain Location: R knee Pain Descriptors / Indicators: Aching Pain Intervention(s): Limited activity within patient's tolerance;Repositioned;Monitored during session;Ice  applied     Hand Dominance Right   Extremity/Trunk Assessment Upper Extremity Assessment Upper Extremity  Assessment: Generalized weakness   Lower Extremity Assessment Lower Extremity Assessment: Defer to PT evaluation   Cervical / Trunk Assessment Cervical / Trunk Assessment: Kyphotic   Communication Communication Communication: HOH   Cognition Arousal/Alertness: Awake/alert Behavior During Therapy: WFL for tasks assessed/performed Overall Cognitive Status: History of cognitive impairments - at baseline                                                    Home Living Family/patient expects to be discharged to:: Private residence Living Arrangements: Children Available Help at Discharge: Family;Available PRN/intermittently;Personal care attendant Type of Home: House Home Access: Ramped entrance     Home Layout: Two level;Able to live on main level with bedroom/bathroom     Bathroom Shower/Tub: Tub/shower unit (with door (walk in tub))   Bathroom Toilet: Standard Bathroom Accessibility: Yes   Home Equipment: Walker - 2 wheels;Walker - 4 wheels;Bedside commode;Shower seat - built in;Wheelchair Pensions consultant Comments: has hired 24/7 care      Prior Functioning/Environment Level of Independence: Needs Financial planner / Transfers Assistance Needed: pt used RW in house and rollator in community ADL's / Homemaking Assistance Needed: per chart review, Pt received assist from staff for bathing and dressing, Pt reports she completed ADLs without assist (Pt with baseline history of cognitive impairments so unclear as to accuracy of Pt report regarding level of assist)             OT Problem List: Decreased strength;Impaired balance (sitting and/or standing);Decreased cognition;Decreased knowledge of precautions;Decreased activity tolerance;Decreased knowledge of use of DME or AE      OT Treatment/Interventions: Self-care/ADL training;DME and/or AE instruction;Therapeutic activities;Balance training;Therapeutic exercise;Energy  conservation;Patient/family education    OT Goals(Current goals can be found in the care plan section) Acute Rehab OT Goals Patient Stated Goal: home OT Goal Formulation: With patient Time For Goal Achievement: 10/22/16 Potential to Achieve Goals: Good  OT Frequency: Min 2X/week                             AM-PAC PT "6 Clicks" Daily Activity     Outcome Measure Help from another person eating meals?: None Help from another person taking care of personal grooming?: A Little Help from another person toileting, which includes using toliet, bedpan, or urinal?: A Lot Help from another person bathing (including washing, rinsing, drying)?: A Lot Help from another person to put on and taking off regular upper body clothing?: A Little Help from another person to put on and taking off regular lower body clothing?: A Lot 6 Click Score: 16   End of Session Equipment Utilized During Treatment: Gait belt;Rolling walker CPM Right Knee CPM Right Knee: Off Nurse Communication: Mobility status  Activity Tolerance: Patient tolerated treatment well Patient left: in bed;with call bell/phone within reach;with bed alarm set  OT Visit Diagnosis: Unsteadiness on feet (R26.81);Muscle weakness (generalized) (M62.81)                Time: 1610-9604 OT Time Calculation (min): 38 min Charges:  OT General Charges $OT Visit: 1 Procedure OT Evaluation $OT Eval Moderate Complexity: 1 Procedure OT Treatments $Self Care/Home Management : 8-22 mins G-Codes:  Marcy SirenBreanna Shaya Reddick, OT Pager [phone] 10/08/2016   Orlando PennerBreanna L Mikail Goostree 10/08/2016, 3:19 PM

## 2016-10-08 NOTE — Op Note (Signed)
TOTAL KNEE REPLACEMENT OPERATIVE NOTE:  10/07/2016  12:07 PM  PATIENT:  Jessica MunchPauline G Garris  83 y.o. female  PRE-OPERATIVE DIAGNOSIS:  primary osteoarthritis right knee   POST-OPERATIVE DIAGNOSIS:  primary osteoarthritis right knee   PROCEDURE:  Procedure(s): TOTAL KNEE ARTHROPLASTY  SURGEON:  Surgeon(s): Dannielle HuhLucey, Jovin Fester, MD  PHYSICIAN ASSISTANT: Laurier Nancy{Colby Robbins, PAC / Skip MayerBlair Roberts, [facility]  ANESTHESIA:   spinal  DRAINS: Hemovac  SPECIMEN: None  COUNTS:  Correct  TOURNIQUET:   Total Tourniquet Time Documented: area (laterality) - 45 minutes Total: area (laterality) - 45 minutes   DICTATION:  Indication for procedure:    The patient is a 83 y.o. female who has failed conservative treatment for primary osteoarthritis right knee .  Informed consent was obtained prior to anesthesia. The risks versus benefits of the operation were explain and in a way the patient can, and did, understand.   On the implant demand matching protocol, this patient scored 7.  Therefore, this patient was not receive a polyethylene insert with vitamin E which is a high demand implant.  Description of procedure:     The patient was taken to the operating room and placed under anesthesia.  The patient was positioned in the usual fashion taking care that all body parts were adequately padded and/or protected.  I foley catheter was not placed.  A tourniquet was applied and the leg prepped and draped in the usual sterile fashion.  The extremity was exsanguinated with the esmarch and tourniquet inflated to 350 mmHg.  Pre-operative range of motion was 0-75 degrees flexion.  The knee was in 5 degree of mild valgus.  A midline incision approximately 6-7 inches long was made with a #10 blade.  A new blade was used to make a parapatellar arthrotomy going 2-3 cm into the quadriceps tendon, over the patella, and alongside the medial aspect of the patellar tendon.  A synovectomy was then performed with the #10 blade and  forceps. I then elevated the deep MCL off the medial tibial metaphysis subperiosteally around to the semimembranosus attachment.    I everted the patella and used calipers to measure patellar thickness.  I used the reamer to ream down to appropriate thickness to recreate the native thickness.  I then removed excess bone with the rongeur and sagittal saw.  I used the appropriately sized template and drilled the three lug holes.  I then put the trial in place and measured the thickness with the calipers to ensure recreation of the native thickness.  The trial was then removed and the patella subluxed and the knee brought into flexion.  A homan retractor was place to retract and protect the patella and lateral structures.  A Z-retractor was place medially to protect the medial structures.  The extra-medullary alignment system was used to make cut the tibial articular surface perpendicular to the anamotic axis of the tibia and in 3 degrees of posterior slope.  The cut surface and alignment jig was removed.  I then used the intramedullary alignment guide to make a 5 valgus cut on the distal femur.  I then marked out the epicondylar axis on the distal femur.  The posterior condylar axis measured 3 degrees.  I then used the anterior referencing sizer and measured the femur to be a size 6.  The 4-In-1 cutting block was screwed into place in external rotation matching the posterior condylar angle, making our cuts perpendicular to the epicondylar axis.  Anterior, posterior and chamfer cuts were made with the sagittal  saw.  The cutting block and cut pieces were removed.  A lamina spreader was placed in 90 degrees of flexion.  The ACL, PCL, menisci, and posterior condylar osteophytes were removed.  A 12 mm spacer blocked was found to offer good flexion and extension gap balance after minimal in degree releasing.   The scoop retractor was then placed and the femoral finishing block was pinned in place.  The small  sagittal saw was used as well as the lug drill to finish the femur.  The block and cut surfaces were removed and the medullary canal hole filled with autograft bone from the cut pieces.  The tibia was delivered forward in deep flexion and external rotation.  A size E tray was selected and pinned into place centered on the medial 1/3 of the tibial tubercle.  The reamer and keel was used to prepare the tibia through the tray.    I then trialed with the size 6 femur, size E tibia, a 12 mm insert and the 32 patella.  I had excellent flexion/extension gap balance, excellent patella tracking.  Flexion was full and beyond 120 degrees; extension was zero.  These components were chosen and the staff opened them to me on the back table while the knee was lavaged copiously and the cement mixed.  The soft tissue was infiltrated with 60cc of exparel 1.3% through a 21 gauge needle.  I cemented in the components and removed all excess cement.  The polyethylene tibial component was snapped into place and the knee placed in extension while cement was hardening.  The capsule was infilltrated with 30cc of .25% Marcaine with epinephrine.  A hemovac was place in the joint exiting superolaterally.  A pain pump was place superomedially superficial to the arthrotomy.  Once the cement was hard, the tourniquet was let down.  Hemostasis was obtained.  The arthrotomy was closed with figure-8 #1 vicryl sutures.  The deep soft tissues were closed with #0 vicryls and the subcuticular layer closed with a running #2-0 vicryl.  The skin was reapproximated and closed with skin staples.  The wound was dressed with xeroform, 4 x4's, 2 ABD sponges, a single layer of webril and a TED stocking.   The patient was then awakened, extubated, and taken to the recovery room in stable condition.  BLOOD LOSS:  300cc DRAINS: 1 hemovac, 1 pain catheter COMPLICATIONS:  None.  PLAN OF CARE: Admit to inpatient   PATIENT DISPOSITION:  PACU -  hemodynamically stable.   Delay start of Pharmacological VTE agent (>24hrs) due to surgical blood loss or risk of bleeding:  not applicable  Please fax a copy of this op note to my office at [phone] (please only include page 1 and 2 of the Case Information op note)

## 2016-10-08 NOTE — Progress Notes (Signed)
Physical Therapy Treatment Patient Details Name: Jessica Hawkins MRN: [id number] DOB: 10-22-1922 Today's Date: 10/08/2016    History of Present Illness Pt is a 83 yo female admitted 10/07/2016 for elective R TKA. PMH: cellulitis, recurrent UTI, cognitive impairment PSH: L TKA.    PT Comments    Pt with improved ambulation tolerance this afternoon but remains very guarded with active and AA R knee ROM. Pt con't to require modA for mobility. Acute PT to con't to follow.   Follow Up Recommendations  Home health PT;Supervision/Assistance - 24 hour     Equipment Recommendations  Rolling walker with 5" wheels    Recommendations for Other Services       Precautions / Restrictions Precautions Precautions: Knee;Fall Precaution Booklet Issued: Yes (comment) Precaution Comments: went over ther ex Restrictions Weight Bearing Restrictions: Yes RLE Weight Bearing: Weight bearing as tolerated    Mobility  Bed Mobility Overal bed mobility: Needs Assistance Bed Mobility: Supine to Sit;Sit to Supine     Supine to sit: Mod assist Sit to supine: Mod assist   General bed mobility comments: assist for LE managment and trunk elevation  Transfers Overall transfer level: Needs assistance Equipment used: Rolling walker (2 wheeled) Transfers: Sit to/from Stand Sit to Stand: Mod assist Stand pivot transfers: Mod assist       General transfer comment: Max directional verbal/tactile cues, ModA to rise to standing, to steady and to maintain standing posture during stand pivot transfer  Ambulation/Gait Ambulation/Gait assistance: Min assist;Mod assist;+2 safety/equipment Ambulation Distance (Feet): 60 Feet Assistive device: Rolling walker (2 wheeled) Gait Pattern/deviations: Step-to pattern;Decreased stride length;Decreased stance time - right Gait velocity: slow Gait velocity interpretation: Below normal speed for age/gender General Gait Details: modA initially but then transitioned to minA  with improved stability. pt very dependent on RW   Stairs            Wheelchair Mobility    Modified Rankin (Stroke Patients Only)       Balance Overall balance assessment: Needs assistance Sitting-balance support: Feet supported;Single extremity supported Sitting balance-Leahy Scale: Fair Sitting balance - Comments: sitting EOB prior to return to supine with MinGuard assist    Standing balance support: Bilateral upper extremity supported Standing balance-Leahy Scale: Poor Standing balance comment: dependent on RW and physical assist                            Cognition Arousal/Alertness: Awake/alert Behavior During Therapy: WFL for tasks assessed/performed Overall Cognitive Status: History of cognitive impairments - at baseline                                        Exercises General Exercises - Lower Extremity Ankle Circles/Pumps: AROM;Both;10 reps;Supine Quad Sets: AROM;Right;10 reps;Seated Long Arc Quad: AAROM;Right;10 reps;Seated Heel Slides: AAROM;Right;10 reps;Seated    General Comments General comments (skin integrity, edema, etc.): pt with + emesis s/p ambulation, RN notified      Pertinent Vitals/Pain Pain Assessment: Faces Faces Pain Scale: Hurts little more Pain Location: R kene Pain Descriptors / Indicators: Aching Pain Intervention(s): Limited activity within patient's tolerance    Home Living Family/patient expects to be discharged to:: Private residence Living Arrangements: Children Available Help at Discharge: Family;Available PRN/intermittently;Personal care attendant Type of Home: House Home Access: Ramped entrance   Home Layout: Two level;Able to live on main level with bedroom/bathroom Home Equipment:  Walker - 2 wheels;Walker - 4 wheels;Bedside commode;Shower seat - built Nurse, children'sin;Wheelchair - manual;Transport chair Additional Comments: has hired 24/7 care    Prior Function Level of Independence: Needs  assistance  Gait / Transfers Assistance Needed: pt used RW in house and rollator in community ADL's / Homemaking Assistance Needed: per chart review, Pt received assist from staff for bathing and dressing, Pt reports she completed ADLs without assist (Pt with baseline history of cognitive impairments so unclear as to accuracy of Pt report regarding level of assist)      PT Goals (current goals can now be found in the care plan section) Acute Rehab PT Goals Patient Stated Goal: home PT Goal Formulation: With patient Time For Goal Achievement: 10/15/16 Potential to Achieve Goals: Good Progress towards PT goals: Progressing toward goals    Frequency    7X/week      PT Plan Current plan remains appropriate    Co-evaluation              AM-PAC PT "6 Clicks" Daily Activity  Outcome Measure  Difficulty turning over in bed (including adjusting bedclothes, sheets and blankets)?: Total Difficulty moving from lying on back to sitting on the side of the bed? : Total Difficulty sitting down on and standing up from a chair with arms (e.g., wheelchair, bedside commode, etc,.)?: Total Help needed moving to and from a bed to chair (including a wheelchair)?: A Lot Help needed walking in hospital room?: A Lot Help needed climbing 3-5 steps with a railing? : Total 6 Click Score: 8    End of Session Equipment Utilized During Treatment: Gait belt Activity Tolerance: Patient tolerated treatment well Patient left: in bed;in CPM;with call bell/phone within reach Nurse Communication: Mobility status PT Visit Diagnosis: Difficulty in walking, not elsewhere classified (R26.2)     Time: 1610-96041500-1532 PT Time Calculation (min) (ACUTE ONLY): 32 min  Charges:  $Gait Training: 8-22 mins $Therapeutic Exercise: 8-22 mins                    G Codes:       Lewis ShockAshly Zion Lint, PT, DPT Pager #: [phone] Office #: [phone]    Christinia Lambeth M Rejeana Fadness 10/08/2016, 4:45 PM

## 2016-10-08 NOTE — Progress Notes (Signed)
RN gave 50 mg tramadol per MD verbal order.

## 2016-10-08 NOTE — Evaluation (Signed)
Physical Therapy Evaluation Patient Details Name: Jessica Hawkins MRN: [id number] DOB: 07-10-1922 Today's Date: 10/08/2016   History of Present Illness  Pt is a 83 yo female admitted 10/07/2016 for elective R TKA. PMH: cellulitis, recurrent UTI, cognitive impairment PSH: L TKA.  Clinical Impression  Pt is s/p TKA resulting in the deficits listed below (see PT Problem List). Pt requiring mod/maxA for mobility however pt with hired caregivers for 24/7 assist.  Pt will benefit from skilled PT to increase their independence and safety with mobility to allow discharge to the venue listed below.      Follow Up Recommendations Home health PT;Supervision/Assistance - 24 hour    Equipment Recommendations  Rolling walker with 5" wheels    Recommendations for Other Services       Precautions / Restrictions Precautions Precautions: Knee;Fall Precaution Booklet Issued: No Precaution Comments: educated pt and dtr on not putting anythign under the R knee Restrictions Weight Bearing Restrictions: Yes RLE Weight Bearing: Weight bearing as tolerated      Mobility  Bed Mobility               General bed mobility comments: pt up in chair upon PT arrival  Transfers Overall transfer level: Needs assistance Equipment used: Rolling walker (2 wheeled) Transfers: Sit to/from Stand Sit to Stand: Mod assist         General transfer comment: max directional v/c's, modA to maintain standing during transition of hands from arm rests of chair to RW, modA for initial power up  Ambulation/Gait Ambulation/Gait assistance: Min assist;Mod assist;+2 safety/equipment Ambulation Distance (Feet): 30 Feet Assistive device: Rolling walker (2 wheeled) Gait Pattern/deviations: Step-to pattern;Decreased stride length;Decreased stance time - right Gait velocity: slow Gait velocity interpretation: Below normal speed for age/gender General Gait Details: modA to assist with weight shift over to the left for  swing phase of R, modA under arm to assist as a crutch during R LE WBing due to increased pain  Stairs            Wheelchair Mobility    Modified Rankin (Stroke Patients Only)       Balance Overall balance assessment: Needs assistance         Standing balance support: Bilateral upper extremity supported Standing balance-Leahy Scale: Poor Standing balance comment: dependent on RW and physical assist                             Pertinent Vitals/Pain Pain Assessment: Faces Faces Pain Scale: Hurts even more Pain Location: R knee Pain Descriptors / Indicators: Aching Pain Intervention(s): Monitored during session    Home Living Family/patient expects to be discharged to:: Private residence Living Arrangements: Children Available Help at Discharge: Family;Available PRN/intermittently;Personal care attendant Type of Home: House Home Access: Ramped entrance     Home Layout: Two level;Able to live on main level with bedroom/bathroom Home Equipment: Dan Humphreys - 2 wheels;Walker - 4 wheels;Bedside commode;Shower seat - built in;Wheelchair Social research officer, government Comments: has hired 24/7 care    Prior Function Level of Independence: Needs assistance   Gait / Transfers Assistance Needed: pt used RW in house and rollator in community  ADL's / Homemaking Assistance Needed: assist from staff for bathing and dressing        Hand Dominance   Dominant Hand: Right    Extremity/Trunk Assessment   Upper Extremity Assessment Upper Extremity Assessment: Generalized weakness    Lower Extremity Assessment Lower Extremity Assessment:  Generalized weakness (minimal R quad set activation)    Cervical / Trunk Assessment Cervical / Trunk Assessment: Kyphotic  Communication   Communication: HOH  Cognition Arousal/Alertness: Awake/alert Behavior During Therapy: WFL for tasks assessed/performed Overall Cognitive Status: History of cognitive impairments - at  baseline                                        General Comments General comments (skin integrity, edema, etc.): pt with + emesis s/p ambulation, RN notified    Exercises General Exercises - Lower Extremity Quad Sets: AROM;Right;10 reps;Seated (with LE elevated) Long Arc Quad: AAROM;Right;10 reps;Seated Heel Slides: AAROM;Right;10 reps;Seated (with towel under foot)   Assessment/Plan    PT Assessment Patient needs continued PT services  PT Problem List Decreased strength;Decreased range of motion;Decreased activity tolerance;Decreased balance;Decreased mobility;Decreased knowledge of use of DME       PT Treatment Interventions DME instruction;Gait training;Stair training;Functional mobility training;Therapeutic activities;Therapeutic exercise;Balance training;Neuromuscular re-education    PT Goals (Current goals can be found in the Care Plan section)  Acute Rehab PT Goals Patient Stated Goal: home PT Goal Formulation: With patient Time For Goal Achievement: 10/15/16 Potential to Achieve Goals: Good    Frequency 7X/week   Barriers to discharge        Co-evaluation               AM-PAC PT "6 Clicks" Daily Activity  Outcome Measure Difficulty turning over in bed (including adjusting bedclothes, sheets and blankets)?: Total Difficulty moving from lying on back to sitting on the side of the bed? : Total Difficulty sitting down on and standing up from a chair with arms (e.g., wheelchair, bedside commode, etc,.)?: Total Help needed moving to and from a bed to chair (including a wheelchair)?: A Lot Help needed walking in hospital room?: A Lot Help needed climbing 3-5 steps with a railing? : Total 6 Click Score: 8    End of Session Equipment Utilized During Treatment: Gait belt Activity Tolerance: Patient limited by pain (and + emesis) Patient left: in chair;with call bell/phone within reach;with family/visitor present Nurse Communication: Mobility  status (+ emesis) PT Visit Diagnosis: Difficulty in walking, not elsewhere classified (R26.2)    Time: 9528-41321113-1146 PT Time Calculation (min) (ACUTE ONLY): 33 min   Charges:   PT Evaluation $PT Eval Moderate Complexity: 1 Mod PT Treatments $Gait Training: 8-22 mins   PT G Codes:        Lewis ShockAshly Megen Madewell, PT, DPT Pager #: [phone] Office #: [phone]   Arma Reining M Ell Tiso 10/08/2016, 1:23 PM

## 2016-10-08 NOTE — Discharge Instructions (Signed)

## 2016-10-09 LAB — GLUCOSE, CAPILLARY
GLUCOSE-CAPILLARY: 102 mg/dL — AB (ref 65–99)
GLUCOSE-CAPILLARY: 120 mg/dL — AB (ref 65–99)

## 2016-10-09 LAB — CBC
HCT: 30.3 % — ABNORMAL LOW (ref 36.0–46.0)
Hemoglobin: 9.5 g/dL — ABNORMAL LOW (ref 12.0–15.0)
MCH: 26.7 pg (ref 26.0–34.0)
MCHC: 31.4 g/dL (ref 30.0–36.0)
MCV: 85.1 fL (ref 78.0–100.0)
PLATELETS: 158 10*3/uL (ref 150–400)
RBC: 3.56 MIL/uL — ABNORMAL LOW (ref 3.87–5.11)
RDW: 15.1 % (ref 11.5–15.5)
WBC: 7.5 10*3/uL (ref 4.0–10.5)

## 2016-10-09 NOTE — Care Management Note (Signed)
Case Management Note  Patient Details  Name: Gerhard Munchauline G Iten MRN: [id number] Date of Birth: April 03, 1922  Subjective/Objective:   83 yr old female s/p right total knee arthroplasty.                  Action/Plan: Case manager spoke with patient and her daughter concerning discharge plan and DME needs. Patient was preoperatively setup with [facility], no changes. She has DME at the home. Patient's daughter says that they have a staff of care providers assisting patient at home.    Expected Discharge Date:    10/09/16              Expected Discharge Plan:  Home w Home Health Services  In-House Referral:     Discharge planning Services  CM Consult  Post Acute Care Choice:  Home Health Choice offered to:  Patient, Adult Children  DME Arranged:  N/A (Has RW and 3in1) DME Agency:  NA  HH Arranged:  PT HH Agency:  [facility] (formerly [facility])  Status of Service:  Completed, signed off  If discussed at [employer] Meetings, dates discussed:    Additional Comments:  Durenda GuthrieBrady, Leshonda Galambos Naomi, RN 10/09/2016, 11:23 AM

## 2016-10-09 NOTE — Anesthesia Postprocedure Evaluation (Signed)
Anesthesia Post Note  Patient: Gerhard Munchauline G Albor  Procedure(s) Performed: Procedure(s) (LRB): TOTAL KNEE ARTHROPLASTY (Right)     Patient location during evaluation: PACU Anesthesia Type: Spinal Level of consciousness: awake and alert Pain management: pain level controlled Vital Signs Assessment: post-procedure vital signs reviewed and stable Respiratory status: spontaneous breathing, nonlabored ventilation, respiratory function stable and patient connected to nasal cannula oxygen Cardiovascular status: blood pressure returned to baseline and stable Postop Assessment: no signs of nausea or vomiting Anesthetic complications: no    Last Vitals:  Vitals:   10/08/16 2017 10/09/16 0442  BP: (!) 143/51 (!) 132/59  Pulse: 63 70  Resp: 16 16  Temp: 36.9 C 36.6 C    Last Pain:  Vitals:   10/09/16 0631  TempSrc:   PainSc: 4    Pain Goal: Patients Stated Pain Goal: 0 (10/07/16 2308)               Cristela BlueJACKSON,Cloey Sferrazza EDWARD

## 2016-10-09 NOTE — Progress Notes (Signed)
Occupational Therapy Treatment Patient Details Name: Jessica Hawkins Myint MRN: [id number] DOB: [birth year] Today's Date: 10/09/2016    History of present illness Pt is a 83 yo female admitted 10/07/2016 for elective R TKA. PMH: cellulitis, recurrent UTI, cognitive impairment PSH: L TKA.   OT comments  Pt making steady progress towards goals. Daughter present and provided extensive education to Pt and daughter on strategies and compensatory techniques for ADLs and functional mobility transfers throughout session. Pt completed functional mobility within room using RW with increased time and overall MinA, completed standing ADLs with Min steady assist. Questions answered throughout session. Will continue to follow acutely to maximize Pt's safety and independence with ADLs and functional mobility.    Follow Up Recommendations  Home health OT;Supervision/Assistance - 24 hour    Equipment Recommendations  None recommended by OT          Precautions / Restrictions Precautions Precautions: Knee;Fall Precaution Comments: reviewed with Pt and Pt's daughter  Restrictions RLE Weight Bearing: Weight bearing as tolerated       Mobility Bed Mobility Overal bed mobility: Needs Assistance Bed Mobility: Supine to Sit     Supine to sit: Min assist;HOB elevated     General bed mobility comments: MinA for LE management   Transfers Overall transfer level: Needs assistance Equipment used: Rolling walker (2 wheeled) Transfers: Sit to/from Stand Sit to Stand: Mod assist;Min assist         General transfer comment: ModA initially to stand from EOB with multimodal cues for LE and hand placement; Min[facility]     Balance Overall balance assessment: Needs assistance Sitting-balance support: Feet supported;Single extremity supported Sitting balance-Leahy Scale: Fair Sitting balance - Comments: sitting EOB    Standing balance support: Bilateral upper extremity  supported Standing balance-Leahy Scale: Poor Standing balance comment: dependent on RW and physical assist                           ADL either performed or assessed with clinical judgement   ADL       Grooming: Oral care;Minimal assistance;Standing                 Lower Body Dressing Details (indicate cue type and reason): educated on compensatory techinques for completing  Toilet Transfer: Minimal assistance;Ambulation;BSC;RW Toilet Transfer Details (indicate cue type and reason): BSC over toilet  Toileting- Clothing Manipulation and Hygiene: Minimal assistance;Sit to/from stand       Functional mobility during ADLs: Minimal assistance;Rolling walker General ADL Comments: extensive education provided throughout session to Pt and Pt's daughter as daughter is preparing for Pt's d/c home with hired care aides. Education provided on bed mobility, knee precautions, safe transfer techniques, as well as compensatory techniques/strategies for completing ADLs at home. Education was provided while Pt performed ADLs and functional mobility tasks                        Cognition Arousal/Alertness: Awake/alert Behavior During Therapy: WFL for tasks assessed/performed Overall Cognitive Status: History of cognitive impairments - at baseline                                                            Pertinent Vitals/ Pain  Pain Assessment: Faces Faces Pain Scale: Hurts a little bit Pain Location: R knee  Pain Descriptors / Indicators: Aching;Sore Pain Intervention(s): Limited activity within patient's tolerance;Repositioned;Monitored during session                                                          Frequency  Min 2X/week        Progress Toward Goals  OT Goals(current goals can now be found in the care plan section)  Progress towards OT goals: Progressing toward goals  Acute Rehab OT  Goals Patient Stated Goal: home OT Goal Formulation: With patient Time For Goal Achievement: 10/22/16 Potential to Achieve Goals: Good  Plan Discharge plan remains appropriate                     AM-PAC PT "6 Clicks" Daily Activity     Outcome Measure   Help from another person eating meals?: None Help from another person taking care of personal grooming?: A Little Help from another person toileting, which includes using toliet, bedpan, or urinal?: A Lot Help from another person bathing (including washing, rinsing, drying)?: A Lot Help from another person to put on and taking off regular upper body clothing?: A Little Help from another person to put on and taking off regular lower body clothing?: A Lot 6 Click Score: 16    End of Session Equipment Utilized During Treatment: Gait belt;Rolling walker  OT Visit Diagnosis: Unsteadiness on feet (R26.81);Muscle weakness (generalized) (M62.81)   Activity Tolerance Patient tolerated treatment well   Patient Left Other (comment);with nursing/sitter in room;with family/visitor present (Pt sitting on BSC at sink to wash up, daughter supervising, RN present and NT aware, daughter aware of need to call for mobility assist when Pt completes task and ready to return to bed/chair)   Nurse Communication Mobility status;Other (comment) (Pt currently seated in bathroom with daughter supervision)        Time: 1610-9604 OT Time Calculation (min): 38 min  Charges: OT General Charges $OT Visit: 1 Procedure OT Treatments $Self Care/Home Management : 23-37 mins  Marcy Siren, OT Pager [phone] 10/09/2016    Orlando Penner 10/09/2016, 11:24 AM

## 2016-10-09 NOTE — Progress Notes (Signed)
Physical Therapy Treatment Patient Details Name: Jessica Hawkins MRN: [id number] DOB: [birth year] Today's Date: 10/09/2016    History of Present Illness Pt is a 83 yo female admitted 10/07/2016 for elective R TKA. PMH: cellulitis, recurrent UTI, cognitive impairment PSH: L TKA.    PT Comments    Pt remains to await d/c and increased gait training this pm.  Pt performed HEP and education provided to patient's daughter.  Informed nursing that patient is ready to d/c home.    Follow Up Recommendations  Home health PT;Supervision/Assistance - 24 hour     Equipment Recommendations  Rolling walker with 5" wheels    Recommendations for Other Services       Precautions / Restrictions Precautions Precautions: Knee;Fall Precaution Booklet Issued: Yes (comment) Precaution Comments: reviewed with Pt and Pt's daughter  Restrictions Weight Bearing Restrictions: Yes RLE Weight Bearing: Weight bearing as tolerated    Mobility  Bed Mobility               General bed mobility comments: Pt recieved in recliner chair on arrival.    Transfers Overall transfer level: Needs assistance Equipment used: Rolling walker (2 wheeled) Transfers: Sit to/from Stand Sit to Stand: Min assist;Mod assist Stand pivot transfers: Mod assist       General transfer comment: Min/Mod for safety, Pt required VC's for hand placement, for LE placement, to lean forward before standing.  Ambulation/Gait Ambulation/Gait assistance: Min guard;+2 safety/equipment (chair follow) Ambulation Distance (Feet): 130 Feet Assistive device: Rolling walker (2 wheeled) Gait Pattern/deviations: Step-to pattern;Step-through pattern;Decreased stride length;Decreased dorsiflexion - right;Decreased dorsiflexion - left;Trunk flexed Gait velocity: decreased    General Gait Details: VC's for posture (continuous), sequencing/technique, and increasing step height    Stairs            Wheelchair Mobility    Modified  Rankin (Stroke Patients Only)       Balance Overall balance assessment: Needs assistance Sitting-balance support: Feet supported;No upper extremity supported Sitting balance-Leahy Scale: Fair Sitting balance - Comments: sitting EOB    Standing balance support: Bilateral upper extremity supported;During functional activity Standing balance-Leahy Scale: Poor Standing balance comment: Pt requires UE support from RW during static standing and functional activity                             Cognition Arousal/Alertness: Awake/alert Behavior During Therapy: WFL for tasks assessed/performed Overall Cognitive Status: Within Functional Limits for tasks assessed                                        Exercises Total Joint Exercises Ankle Circles/Pumps: AROM;20 reps;Both;Seated Quad Sets: AROM;Seated;Right;10 reps Towel Squeeze: AROM;Right;10 reps;Seated Heel Slides: AROM;Right;10 reps;Seated Hip ABduction/ADduction: AROM;Right;Seated;10 reps Straight Leg Raises: AROM;Right;10 reps;Seated    General Comments        Pertinent Vitals/Pain Pain Assessment: Faces Faces Pain Scale: Hurts a little bit Pain Location: R knee  Pain Descriptors / Indicators: Aching;Sore Pain Intervention(s): Monitored during session;Repositioned    Home Living                      Prior Function            PT Goals (current goals can now be found in the care plan section) Acute Rehab PT Goals Patient Stated Goal: to go home today  Potential to Achieve  Goals: Good Progress towards PT goals: Progressing toward goals    Frequency    7X/week      PT Plan Current plan remains appropriate    Co-evaluation              AM-PAC PT "6 Clicks" Daily Activity  Outcome Measure  Difficulty turning over in bed (including adjusting bedclothes, sheets and blankets)?: A Little Difficulty moving from lying on back to sitting on the side of the bed? : A  Lot Difficulty sitting down on and standing up from a chair with arms (e.g., wheelchair, bedside commode, etc,.)?: Total Help needed moving to and from a bed to chair (including a wheelchair)?: A Little Help needed walking in hospital room?: A Little Help needed climbing 3-5 steps with a railing? : A Lot 6 Click Score: 14    End of Session Equipment Utilized During Treatment: Gait belt Activity Tolerance: Patient tolerated treatment well;Patient limited by fatigue Patient left: in chair;with call bell/phone within reach;with family/visitor present Nurse Communication: Mobility status PT Visit Diagnosis: Difficulty in walking, not elsewhere classified (R26.2)     Time: [id number] PT Time Calculation (min) (ACUTE ONLY): 34 min  Charges:  $Gait Training: 8-22 mins $Therapeutic Exercise: 8-22 mins                    G Codes:       Joycelyn RuaAimee Kooper Chriswell, PTA pager [phone]    Florestine AversAimee J Georgio Hattabaugh 10/09/2016, 6:06 PM

## 2016-10-09 NOTE — Progress Notes (Signed)
Nutrition Brief Note  Patient identified on the Malnutrition Screening Tool (MST) Report  Wt Readings from Last 15 Encounters:  10/07/16 138 lb (62.6 kg)  09/25/16 138 lb 1.6 oz (62.6 kg)  08/08/16 136 lb 3.2 oz (61.8 kg)  08/01/16 135 lb (61.2 kg)  07/16/16 133 lb (60.3 kg)  06/19/16 137 lb (62.1 kg)  06/18/16 137 lb (62.1 kg)  06/10/16 138 lb (62.6 kg)  05/28/16 137 lb 9.6 oz (62.4 kg)  04/08/16 145 lb 6.4 oz (66 kg)  03/19/16 146 lb (66.2 kg)  02/29/16 142 lb 6.4 oz (64.6 kg)  01/31/16 139 lb 8 oz (63.3 kg)  01/30/16 141 lb 12.8 oz (64.3 kg)  01/10/16 135 lb (61.2 kg)    Body mass index is 26.95 kg/m. Patient meets criteria for overweight based on current BMI. Pt with no significant weight loss.   Current diet order is carbohydrate modifed, patient is consuming approximately 100% of meals at this time. Pt reports having a good appetite with no other difficulties. Labs and medications reviewed. Pt currently has Ensure ordered and has been consuming them. Family at bedside are preparing for discharge. No further nutrition interventions warranted at this time. If nutrition issues arise, please consult RD.   Roslyn SmilingStephanie Xena Propst, MS, RD, LDN Pager # [phone] After hours/ weekend pager # [phone]

## 2016-10-09 NOTE — Progress Notes (Signed)
Physical Therapy Treatment Patient Details Name: Jessica Hawkins MRN: [id number] DOB: 01-29-1923 Today's Date: 10/09/2016    History of Present Illness Pt is a 83 yo female admitted 10/07/2016 for elective R TKA. PMH: cellulitis, recurrent UTI, cognitive impairment PSH: L TKA.    PT Comments    Pt tolerated Tx well with some limitations due to fatigue and LE weakness. Pt was min guard with bed mobility, min/mod with transfers, and min assist with gait with chair follow for safety and with minimal cues. Pt did well with exercises and is motivated to get better. Pt to continue with gait and HEP review while still in acute care.    Follow Up Recommendations  Home health PT;Supervision/Assistance - 24 hour     Equipment Recommendations  Rolling walker with 5" wheels    Recommendations for Other Services       Precautions / Restrictions Precautions Precautions: Knee;Fall Precaution Comments: reviewed with Pt and Pt's daughter  Restrictions Weight Bearing Restrictions: Yes RLE Weight Bearing: Weight bearing as tolerated    Mobility  Bed Mobility Overal bed mobility: Needs Assistance Bed Mobility: Supine to Sit     Supine to sit: HOB elevated;Min guard     General bed mobility comments: Min guard for safety, pt was able to get to EOB without assistance but with increased time   Transfers Overall transfer level: Needs assistance Equipment used: Rolling walker (2 wheeled) Transfers: Sit to/from Stand Sit to Stand: Min assist;Mod assist         General transfer comment: Min/Mod for safety, pt required VC's for hand placement, for LE placement, to lean forward before standing.  Ambulation/Gait Ambulation/Gait assistance: Min assist;+2 safety/equipment (chair follow) Ambulation Distance (Feet): 100 Feet Assistive device: Rolling walker (2 wheeled) Gait Pattern/deviations: Step-to pattern;Step-through pattern;Decreased stride length;Decreased dorsiflexion - right;Decreased  dorsiflexion - left;Trunk flexed Gait velocity: decreased    General Gait Details: VC's for posture (continuous), sequencing/technique    Stairs            Wheelchair Mobility    Modified Rankin (Stroke Patients Only)       Balance Overall balance assessment: Needs assistance Sitting-balance support: Feet supported;No upper extremity supported Sitting balance-Leahy Scale: Fair Sitting balance - Comments: sitting EOB    Standing balance support: Bilateral upper extremity supported;During functional activity Standing balance-Leahy Scale: Poor Standing balance comment: Pt requires UE support from RW during static standing and functional activity                             Cognition Arousal/Alertness: Awake/alert Behavior During Therapy: WFL for tasks assessed/performed Overall Cognitive Status: Within Functional Limits for tasks assessed                                        Exercises Total Joint Exercises Ankle Circles/Pumps: AROM;20 reps;Both;Seated Quad Sets: AROM;Seated;Right;10 reps Towel Squeeze: AROM;Right;10 reps;Seated Heel Slides: AROM;Right;10 reps;Seated Hip ABduction/ADduction: AROM;Right;Seated;10 reps Straight Leg Raises: AROM;Right;10 reps;Seated Goniometric ROM: R knee 5-80 degrees     General Comments        Pertinent Vitals/Pain Pain Assessment: Faces Pain Score: 3  Faces Pain Scale: Hurts a little bit Pain Location: R knee  Pain Descriptors / Indicators: Aching;Sore Pain Intervention(s): Ice applied;Limited activity within patient's tolerance;Monitored during session;Repositioned    Home Living  Prior Function            PT Goals (current goals can now be found in the care plan section) Acute Rehab PT Goals Patient Stated Goal: to go home today  PT Goal Formulation: With patient/family Potential to Achieve Goals: Good Progress towards PT goals: Progressing toward goals     Frequency    7X/week      PT Plan Current plan remains appropriate    Co-evaluation              AM-PAC PT "6 Clicks" Daily Activity  Outcome Measure  Difficulty turning over in bed (including adjusting bedclothes, sheets and blankets)?: A Little Difficulty moving from lying on back to sitting on the side of the bed? : A Lot Difficulty sitting down on and standing up from a chair with arms (e.g., wheelchair, bedside commode, etc,.)?: Total Help needed moving to and from a bed to chair (including a wheelchair)?: A Little Help needed walking in hospital room?: A Little Help needed climbing 3-5 steps with a railing? : A Lot 6 Click Score: 14    End of Session Equipment Utilized During Treatment: Gait belt Activity Tolerance: Patient tolerated treatment well;Patient limited by fatigue Patient left: in chair;with call bell/phone within reach;with family/visitor present Nurse Communication: Mobility status PT Visit Diagnosis: Difficulty in walking, not elsewhere classified (R26.2)     Time: 1610-9604 PT Time Calculation (min) (ACUTE ONLY): 43 min  Charges:    $Gait Training $Therapeutic Exercise $Therapeutic Activity                   G Codes:       Jessica Hawkins, SPTA [id number]   Jessica Hawkins 10/09/2016, 1:02 PM
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
## 2016-10-10 DIAGNOSIS — N189 Chronic kidney disease, unspecified: Secondary | ICD-10-CM | POA: Diagnosis not present

## 2016-10-10 DIAGNOSIS — Z471 Aftercare following joint replacement surgery: Secondary | ICD-10-CM | POA: Diagnosis not present

## 2016-10-10 DIAGNOSIS — E1151 Type 2 diabetes mellitus with diabetic peripheral angiopathy without gangrene: Secondary | ICD-10-CM | POA: Diagnosis not present

## 2016-10-10 DIAGNOSIS — I872 Venous insufficiency (chronic) (peripheral): Secondary | ICD-10-CM | POA: Diagnosis not present

## 2016-10-10 DIAGNOSIS — I129 Hypertensive chronic kidney disease with stage 1 through stage 4 chronic kidney disease, or unspecified chronic kidney disease: Secondary | ICD-10-CM | POA: Diagnosis not present

## 2016-10-10 DIAGNOSIS — M21372 Foot drop, left foot: Secondary | ICD-10-CM | POA: Diagnosis not present

## 2016-10-11 DIAGNOSIS — E1151 Type 2 diabetes mellitus with diabetic peripheral angiopathy without gangrene: Secondary | ICD-10-CM | POA: Diagnosis not present

## 2016-10-11 DIAGNOSIS — Z471 Aftercare following joint replacement surgery: Secondary | ICD-10-CM | POA: Diagnosis not present

## 2016-10-11 DIAGNOSIS — I872 Venous insufficiency (chronic) (peripheral): Secondary | ICD-10-CM | POA: Diagnosis not present

## 2016-10-11 DIAGNOSIS — I129 Hypertensive chronic kidney disease with stage 1 through stage 4 chronic kidney disease, or unspecified chronic kidney disease: Secondary | ICD-10-CM | POA: Diagnosis not present

## 2016-10-11 DIAGNOSIS — N189 Chronic kidney disease, unspecified: Secondary | ICD-10-CM | POA: Diagnosis not present

## 2016-10-11 DIAGNOSIS — M21372 Foot drop, left foot: Secondary | ICD-10-CM | POA: Diagnosis not present

## 2016-10-12 DIAGNOSIS — I129 Hypertensive chronic kidney disease with stage 1 through stage 4 chronic kidney disease, or unspecified chronic kidney disease: Secondary | ICD-10-CM | POA: Diagnosis not present

## 2016-10-12 DIAGNOSIS — I872 Venous insufficiency (chronic) (peripheral): Secondary | ICD-10-CM | POA: Diagnosis not present

## 2016-10-12 DIAGNOSIS — Z471 Aftercare following joint replacement surgery: Secondary | ICD-10-CM | POA: Diagnosis not present

## 2016-10-12 DIAGNOSIS — N189 Chronic kidney disease, unspecified: Secondary | ICD-10-CM | POA: Diagnosis not present

## 2016-10-12 DIAGNOSIS — M21372 Foot drop, left foot: Secondary | ICD-10-CM | POA: Diagnosis not present

## 2016-10-12 DIAGNOSIS — E1151 Type 2 diabetes mellitus with diabetic peripheral angiopathy without gangrene: Secondary | ICD-10-CM | POA: Diagnosis not present

## 2016-10-14 DIAGNOSIS — E1151 Type 2 diabetes mellitus with diabetic peripheral angiopathy without gangrene: Secondary | ICD-10-CM | POA: Diagnosis not present

## 2016-10-14 DIAGNOSIS — I872 Venous insufficiency (chronic) (peripheral): Secondary | ICD-10-CM | POA: Diagnosis not present

## 2016-10-14 DIAGNOSIS — N189 Chronic kidney disease, unspecified: Secondary | ICD-10-CM | POA: Diagnosis not present

## 2016-10-14 DIAGNOSIS — M21372 Foot drop, left foot: Secondary | ICD-10-CM | POA: Diagnosis not present

## 2016-10-14 DIAGNOSIS — Z471 Aftercare following joint replacement surgery: Secondary | ICD-10-CM | POA: Diagnosis not present

## 2016-10-14 DIAGNOSIS — I129 Hypertensive chronic kidney disease with stage 1 through stage 4 chronic kidney disease, or unspecified chronic kidney disease: Secondary | ICD-10-CM | POA: Diagnosis not present

## 2016-10-16 DIAGNOSIS — Z471 Aftercare following joint replacement surgery: Secondary | ICD-10-CM | POA: Diagnosis not present

## 2016-10-16 DIAGNOSIS — M21372 Foot drop, left foot: Secondary | ICD-10-CM | POA: Diagnosis not present

## 2016-10-16 DIAGNOSIS — I129 Hypertensive chronic kidney disease with stage 1 through stage 4 chronic kidney disease, or unspecified chronic kidney disease: Secondary | ICD-10-CM | POA: Diagnosis not present

## 2016-10-16 DIAGNOSIS — I872 Venous insufficiency (chronic) (peripheral): Secondary | ICD-10-CM | POA: Diagnosis not present

## 2016-10-16 DIAGNOSIS — N189 Chronic kidney disease, unspecified: Secondary | ICD-10-CM | POA: Diagnosis not present

## 2016-10-16 DIAGNOSIS — E1151 Type 2 diabetes mellitus with diabetic peripheral angiopathy without gangrene: Secondary | ICD-10-CM | POA: Diagnosis not present

## 2016-10-18 DIAGNOSIS — Z471 Aftercare following joint replacement surgery: Secondary | ICD-10-CM | POA: Diagnosis not present

## 2016-10-18 DIAGNOSIS — Z96651 Presence of right artificial knee joint: Secondary | ICD-10-CM | POA: Diagnosis not present

## 2016-10-21 ENCOUNTER — Encounter: Payer: Self-pay | Admitting: Physical Therapy

## 2016-10-21 ENCOUNTER — Ambulatory Visit: Payer: Medicare Other | Attending: Orthopedic Surgery | Admitting: Physical Therapy

## 2016-10-21 DIAGNOSIS — M25561 Pain in right knee: Secondary | ICD-10-CM | POA: Diagnosis not present

## 2016-10-21 DIAGNOSIS — M25661 Stiffness of right knee, not elsewhere classified: Secondary | ICD-10-CM | POA: Diagnosis not present

## 2016-10-21 DIAGNOSIS — R2689 Other abnormalities of gait and mobility: Secondary | ICD-10-CM | POA: Insufficient documentation

## 2016-10-21 DIAGNOSIS — M6281 Muscle weakness (generalized): Secondary | ICD-10-CM | POA: Insufficient documentation

## 2016-10-21 DIAGNOSIS — R2681 Unsteadiness on feet: Secondary | ICD-10-CM | POA: Diagnosis not present

## 2016-10-21 DIAGNOSIS — R278 Other lack of coordination: Secondary | ICD-10-CM | POA: Insufficient documentation
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
## 2016-10-23 ENCOUNTER — Ambulatory Visit: Payer: Medicare Other | Admitting: Podiatry

## 2016-10-29 ENCOUNTER — Ambulatory Visit: Payer: Medicare Other | Admitting: Physical Therapy

## 2016-10-29 ENCOUNTER — Encounter: Payer: Self-pay | Admitting: Physical Therapy

## 2016-10-29 DIAGNOSIS — M25661 Stiffness of right knee, not elsewhere classified: Secondary | ICD-10-CM

## 2016-10-29 DIAGNOSIS — M6281 Muscle weakness (generalized): Secondary | ICD-10-CM | POA: Diagnosis not present

## 2016-10-29 DIAGNOSIS — M25561 Pain in right knee: Secondary | ICD-10-CM | POA: Diagnosis not present

## 2016-10-29 DIAGNOSIS — R2689 Other abnormalities of gait and mobility: Secondary | ICD-10-CM | POA: Diagnosis not present

## 2016-10-29 DIAGNOSIS — R2681 Unsteadiness on feet: Secondary | ICD-10-CM

## 2016-10-29 DIAGNOSIS — R278 Other lack of coordination: Secondary | ICD-10-CM | POA: Diagnosis not present
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
## 2016-10-30 ENCOUNTER — Ambulatory Visit: Payer: Medicare Other | Admitting: Physical Therapy

## 2016-10-30 ENCOUNTER — Encounter: Payer: Self-pay | Admitting: Physical Therapy

## 2016-10-30 DIAGNOSIS — R2689 Other abnormalities of gait and mobility: Secondary | ICD-10-CM | POA: Diagnosis not present

## 2016-10-30 DIAGNOSIS — M25661 Stiffness of right knee, not elsewhere classified: Secondary | ICD-10-CM

## 2016-10-30 DIAGNOSIS — M6281 Muscle weakness (generalized): Secondary | ICD-10-CM

## 2016-10-30 DIAGNOSIS — R2681 Unsteadiness on feet: Secondary | ICD-10-CM

## 2016-10-30 DIAGNOSIS — R278 Other lack of coordination: Secondary | ICD-10-CM

## 2016-10-30 DIAGNOSIS — M25561 Pain in right knee: Secondary | ICD-10-CM
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
## 2016-11-01 ENCOUNTER — Other Ambulatory Visit: Payer: Self-pay | Admitting: Internal Medicine

## 2016-11-01 DIAGNOSIS — G8929 Other chronic pain: Secondary | ICD-10-CM

## 2016-11-01 DIAGNOSIS — M25511 Pain in right shoulder: Principal | ICD-10-CM

## 2016-11-05 ENCOUNTER — Other Ambulatory Visit: Payer: Self-pay | Admitting: Internal Medicine

## 2016-11-05 DIAGNOSIS — G8929 Other chronic pain: Secondary | ICD-10-CM

## 2016-11-05 DIAGNOSIS — M25511 Pain in right shoulder: Principal | ICD-10-CM

## 2016-11-06 ENCOUNTER — Ambulatory Visit: Payer: Medicare Other | Attending: Orthopedic Surgery | Admitting: Physical Therapy

## 2016-11-06 DIAGNOSIS — R278 Other lack of coordination: Secondary | ICD-10-CM | POA: Insufficient documentation

## 2016-11-06 DIAGNOSIS — R2689 Other abnormalities of gait and mobility: Secondary | ICD-10-CM

## 2016-11-06 DIAGNOSIS — R2681 Unsteadiness on feet: Secondary | ICD-10-CM | POA: Insufficient documentation

## 2016-11-06 DIAGNOSIS — M6281 Muscle weakness (generalized): Secondary | ICD-10-CM | POA: Insufficient documentation

## 2016-11-06 DIAGNOSIS — M25561 Pain in right knee: Secondary | ICD-10-CM | POA: Diagnosis not present

## 2016-11-06 DIAGNOSIS — M25661 Stiffness of right knee, not elsewhere classified: Secondary | ICD-10-CM | POA: Insufficient documentation
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
## 2016-11-08 ENCOUNTER — Encounter: Payer: Self-pay | Admitting: Physical Therapy

## 2016-11-08 ENCOUNTER — Ambulatory Visit: Payer: Medicare Other | Admitting: Physical Therapy

## 2016-11-08 DIAGNOSIS — M25661 Stiffness of right knee, not elsewhere classified: Secondary | ICD-10-CM

## 2016-11-08 DIAGNOSIS — R2681 Unsteadiness on feet: Secondary | ICD-10-CM | POA: Diagnosis not present

## 2016-11-08 DIAGNOSIS — R278 Other lack of coordination: Secondary | ICD-10-CM | POA: Diagnosis not present

## 2016-11-08 DIAGNOSIS — M6281 Muscle weakness (generalized): Secondary | ICD-10-CM

## 2016-11-08 DIAGNOSIS — M25561 Pain in right knee: Secondary | ICD-10-CM | POA: Diagnosis not present

## 2016-11-08 DIAGNOSIS — R2689 Other abnormalities of gait and mobility: Secondary | ICD-10-CM | POA: Diagnosis not present
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
## 2016-11-11 ENCOUNTER — Encounter: Payer: Self-pay | Admitting: Physical Therapy

## 2016-11-11 ENCOUNTER — Ambulatory Visit: Payer: Medicare Other | Attending: Orthopedic Surgery | Admitting: Physical Therapy

## 2016-11-11 DIAGNOSIS — M25561 Pain in right knee: Secondary | ICD-10-CM | POA: Diagnosis not present

## 2016-11-11 DIAGNOSIS — M6281 Muscle weakness (generalized): Secondary | ICD-10-CM | POA: Insufficient documentation

## 2016-11-11 DIAGNOSIS — R2681 Unsteadiness on feet: Secondary | ICD-10-CM

## 2016-11-11 DIAGNOSIS — M25661 Stiffness of right knee, not elsewhere classified: Secondary | ICD-10-CM | POA: Diagnosis not present
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
## 2016-11-13 ENCOUNTER — Ambulatory Visit: Payer: Medicare Other | Admitting: Physical Therapy

## 2016-11-13 DIAGNOSIS — M25561 Pain in right knee: Secondary | ICD-10-CM

## 2016-11-13 DIAGNOSIS — R2689 Other abnormalities of gait and mobility: Secondary | ICD-10-CM | POA: Diagnosis not present

## 2016-11-13 DIAGNOSIS — R278 Other lack of coordination: Secondary | ICD-10-CM | POA: Diagnosis not present

## 2016-11-13 DIAGNOSIS — R109 Unspecified abdominal pain: Secondary | ICD-10-CM | POA: Diagnosis not present

## 2016-11-13 DIAGNOSIS — R2681 Unsteadiness on feet: Secondary | ICD-10-CM | POA: Diagnosis not present

## 2016-11-13 DIAGNOSIS — M6281 Muscle weakness (generalized): Secondary | ICD-10-CM | POA: Diagnosis not present

## 2016-11-13 DIAGNOSIS — M25661 Stiffness of right knee, not elsewhere classified: Secondary | ICD-10-CM
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
## 2016-11-18 ENCOUNTER — Ambulatory Visit: Payer: Medicare Other | Admitting: Physical Therapy

## 2016-11-18 ENCOUNTER — Encounter: Payer: Self-pay | Admitting: Physical Therapy

## 2016-11-18 DIAGNOSIS — R278 Other lack of coordination: Secondary | ICD-10-CM | POA: Diagnosis not present

## 2016-11-18 DIAGNOSIS — M25661 Stiffness of right knee, not elsewhere classified: Secondary | ICD-10-CM | POA: Diagnosis not present

## 2016-11-18 DIAGNOSIS — M25561 Pain in right knee: Secondary | ICD-10-CM

## 2016-11-18 DIAGNOSIS — M6281 Muscle weakness (generalized): Secondary | ICD-10-CM | POA: Diagnosis not present

## 2016-11-18 DIAGNOSIS — R2689 Other abnormalities of gait and mobility: Secondary | ICD-10-CM | POA: Diagnosis not present

## 2016-11-18 DIAGNOSIS — R2681 Unsteadiness on feet: Secondary | ICD-10-CM | POA: Diagnosis not present
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
## 2016-11-20 ENCOUNTER — Telehealth: Payer: Self-pay | Admitting: General Practice

## 2016-11-20 ENCOUNTER — Encounter: Payer: Self-pay | Admitting: Physical Therapy

## 2016-11-20 ENCOUNTER — Ambulatory Visit: Payer: Medicare Other | Admitting: Physical Therapy

## 2016-11-20 DIAGNOSIS — M25561 Pain in right knee: Secondary | ICD-10-CM

## 2016-11-20 DIAGNOSIS — M25661 Stiffness of right knee, not elsewhere classified: Secondary | ICD-10-CM | POA: Diagnosis not present

## 2016-11-20 DIAGNOSIS — M6281 Muscle weakness (generalized): Secondary | ICD-10-CM | POA: Diagnosis not present

## 2016-11-20 DIAGNOSIS — R2689 Other abnormalities of gait and mobility: Secondary | ICD-10-CM | POA: Diagnosis not present

## 2016-11-20 DIAGNOSIS — R2681 Unsteadiness on feet: Secondary | ICD-10-CM | POA: Diagnosis not present

## 2016-11-20 DIAGNOSIS — R278 Other lack of coordination: Secondary | ICD-10-CM | POA: Diagnosis not present
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
## 2016-11-25 ENCOUNTER — Ambulatory Visit: Payer: Medicare Other | Admitting: Physical Therapy

## 2016-11-25 DIAGNOSIS — R2681 Unsteadiness on feet: Secondary | ICD-10-CM

## 2016-11-25 DIAGNOSIS — R278 Other lack of coordination: Secondary | ICD-10-CM | POA: Diagnosis not present

## 2016-11-25 DIAGNOSIS — M25561 Pain in right knee: Secondary | ICD-10-CM | POA: Diagnosis not present

## 2016-11-25 DIAGNOSIS — R2689 Other abnormalities of gait and mobility: Secondary | ICD-10-CM

## 2016-11-25 DIAGNOSIS — M6281 Muscle weakness (generalized): Secondary | ICD-10-CM

## 2016-11-25 DIAGNOSIS — M25661 Stiffness of right knee, not elsewhere classified: Secondary | ICD-10-CM | POA: Diagnosis not present
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
## 2016-11-27 ENCOUNTER — Ambulatory Visit: Payer: Medicare Other | Admitting: Physical Therapy

## 2016-11-27 DIAGNOSIS — M6281 Muscle weakness (generalized): Secondary | ICD-10-CM

## 2016-11-27 DIAGNOSIS — R2681 Unsteadiness on feet: Secondary | ICD-10-CM

## 2016-11-27 DIAGNOSIS — M25561 Pain in right knee: Secondary | ICD-10-CM | POA: Diagnosis not present

## 2016-11-27 DIAGNOSIS — R2689 Other abnormalities of gait and mobility: Secondary | ICD-10-CM | POA: Diagnosis not present

## 2016-11-27 DIAGNOSIS — M25661 Stiffness of right knee, not elsewhere classified: Secondary | ICD-10-CM

## 2016-11-27 DIAGNOSIS — R278 Other lack of coordination: Secondary | ICD-10-CM | POA: Diagnosis not present
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
## 2016-12-02 ENCOUNTER — Ambulatory Visit: Payer: Medicare Other | Attending: Orthopedic Surgery | Admitting: Physical Therapy

## 2016-12-02 DIAGNOSIS — M6281 Muscle weakness (generalized): Secondary | ICD-10-CM | POA: Insufficient documentation

## 2016-12-02 DIAGNOSIS — M25661 Stiffness of right knee, not elsewhere classified: Secondary | ICD-10-CM

## 2016-12-02 DIAGNOSIS — M25561 Pain in right knee: Secondary | ICD-10-CM | POA: Diagnosis not present

## 2016-12-02 DIAGNOSIS — R2689 Other abnormalities of gait and mobility: Secondary | ICD-10-CM | POA: Diagnosis not present

## 2016-12-02 DIAGNOSIS — R278 Other lack of coordination: Secondary | ICD-10-CM

## 2016-12-02 DIAGNOSIS — R2681 Unsteadiness on feet: Secondary | ICD-10-CM | POA: Insufficient documentation
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
## 2016-12-04 ENCOUNTER — Ambulatory Visit: Payer: Medicare Other | Admitting: Physical Therapy

## 2016-12-05 ENCOUNTER — Ambulatory Visit: Payer: Medicare Other | Admitting: Physical Therapy

## 2016-12-05 DIAGNOSIS — R4689 Other symptoms and signs involving appearance and behavior: Secondary | ICD-10-CM | POA: Diagnosis not present

## 2016-12-05 DIAGNOSIS — E1151 Type 2 diabetes mellitus with diabetic peripheral angiopathy without gangrene: Secondary | ICD-10-CM | POA: Diagnosis not present

## 2016-12-05 DIAGNOSIS — D631 Anemia in chronic kidney disease: Secondary | ICD-10-CM | POA: Diagnosis not present

## 2016-12-05 DIAGNOSIS — M1990 Unspecified osteoarthritis, unspecified site: Secondary | ICD-10-CM | POA: Diagnosis not present

## 2016-12-05 DIAGNOSIS — I872 Venous insufficiency (chronic) (peripheral): Secondary | ICD-10-CM | POA: Diagnosis not present

## 2016-12-05 DIAGNOSIS — E039 Hypothyroidism, unspecified: Secondary | ICD-10-CM | POA: Diagnosis not present

## 2016-12-05 DIAGNOSIS — N189 Chronic kidney disease, unspecified: Secondary | ICD-10-CM | POA: Diagnosis not present

## 2016-12-05 DIAGNOSIS — R269 Unspecified abnormalities of gait and mobility: Secondary | ICD-10-CM | POA: Diagnosis not present

## 2016-12-05 DIAGNOSIS — R4189 Other symptoms and signs involving cognitive functions and awareness: Secondary | ICD-10-CM | POA: Diagnosis not present

## 2016-12-05 DIAGNOSIS — N390 Urinary tract infection, site not specified: Secondary | ICD-10-CM | POA: Diagnosis not present

## 2016-12-05 DIAGNOSIS — M19041 Primary osteoarthritis, right hand: Secondary | ICD-10-CM | POA: Diagnosis not present

## 2016-12-05 DIAGNOSIS — E785 Hyperlipidemia, unspecified: Secondary | ICD-10-CM | POA: Diagnosis not present

## 2016-12-05 DIAGNOSIS — I10 Essential (primary) hypertension: Secondary | ICD-10-CM | POA: Diagnosis not present

## 2016-12-05 DIAGNOSIS — Z23 Encounter for immunization: Secondary | ICD-10-CM | POA: Diagnosis not present

## 2016-12-05 DIAGNOSIS — Z8269 Family history of other diseases of the musculoskeletal system and connective tissue: Secondary | ICD-10-CM | POA: Diagnosis not present

## 2016-12-06 ENCOUNTER — Other Ambulatory Visit (HOSPITAL_BASED_OUTPATIENT_CLINIC_OR_DEPARTMENT_OTHER): Payer: Medicare Other

## 2016-12-06 DIAGNOSIS — D649 Anemia, unspecified: Secondary | ICD-10-CM | POA: Diagnosis not present

## 2016-12-06 DIAGNOSIS — D509 Iron deficiency anemia, unspecified: Secondary | ICD-10-CM

## 2016-12-06 DIAGNOSIS — D638 Anemia in other chronic diseases classified elsewhere: Secondary | ICD-10-CM

## 2016-12-06 LAB — COMPREHENSIVE METABOLIC PANEL
ALBUMIN: 2.7 g/dL — AB (ref 3.5–5.0)
ALT: 7 U/L (ref 0–55)
AST: 15 U/L (ref 5–34)
Alkaline Phosphatase: 69 U/L (ref 40–150)
Anion Gap: 9 mEq/L (ref 3–11)
BUN: 30.5 mg/dL — AB (ref 7.0–26.0)
CHLORIDE: 98 meq/L (ref 98–109)
CO2: 27 mEq/L (ref 22–29)
CREATININE: 1.1 mg/dL (ref 0.6–1.1)
Calcium: 9.4 mg/dL (ref 8.4–10.4)
EGFR: 53 mL/min/{1.73_m2} — ABNORMAL LOW (ref >90)
GLUCOSE: 190 mg/dL — AB (ref 70–140)
POTASSIUM: 4.2 meq/L (ref 3.5–5.1)
SODIUM: 135 meq/L — AB (ref 136–145)
Total Bilirubin: 0.38 mg/dL (ref 0.20–1.20)
Total Protein: 7.4 g/dL (ref 6.4–8.3)

## 2016-12-06 LAB — CBC & DIFF AND RETIC
BASO%: 0.2 % (ref 0.0–2.0)
BASOS ABS: 0 10*3/uL (ref 0.0–0.1)
EOS ABS: 0 10*3/uL (ref 0.0–0.5)
EOS%: 0 % (ref 0.0–7.0)
HEMATOCRIT: 31.3 % — AB (ref 34.8–46.6)
HEMOGLOBIN: 9.8 g/dL — AB (ref 11.6–15.9)
Immature Retic Fract: 3.2 % (ref 1.60–10.00)
LYMPH#: 0.9 10*3/uL (ref 0.9–3.3)
LYMPH%: 15.7 % (ref 14.0–49.7)
MCH: 26.7 pg (ref 25.1–34.0)
MCHC: 31.3 g/dL — AB (ref 31.5–36.0)
MCV: 85.3 fL (ref 79.5–101.0)
MONO#: 0.3 10*3/uL (ref 0.1–0.9)
MONO%: 4.3 % (ref 0.0–14.0)
NEUT%: 79.8 % — ABNORMAL HIGH (ref 38.4–76.8)
NEUTROS ABS: 4.6 10*3/uL (ref 1.5–6.5)
Platelets: 252 10*3/uL (ref 145–400)
RBC: 3.67 10*6/uL — ABNORMAL LOW (ref 3.70–5.45)
RDW: 13.7 % (ref 11.2–14.5)
RETIC %: 1.26 % (ref 0.70–2.10)
RETIC CT ABS: 46.24 10*3/uL (ref 33.70–90.70)
WBC: 5.8 10*3/uL (ref 3.9–10.3)

## 2016-12-06 LAB — IRON AND TIBC
%SAT: 14 % — ABNORMAL LOW (ref 21–57)
IRON: 21 ug/dL — AB (ref 41–142)
TIBC: 149 ug/dL — ABNORMAL LOW (ref 236–444)
UIBC: 128 ug/dL (ref 120–384)

## 2016-12-06 LAB — FERRITIN: Ferritin: 1563 ng/ml — ABNORMAL HIGH (ref 9–269)

## 2016-12-09 ENCOUNTER — Telehealth: Payer: Self-pay

## 2016-12-09 ENCOUNTER — Encounter: Payer: Self-pay | Admitting: Hematology

## 2016-12-09 ENCOUNTER — Ambulatory Visit (HOSPITAL_BASED_OUTPATIENT_CLINIC_OR_DEPARTMENT_OTHER): Payer: Medicare Other | Admitting: Hematology

## 2016-12-09 ENCOUNTER — Ambulatory Visit: Payer: Medicare Other | Admitting: Physical Therapy

## 2016-12-09 ENCOUNTER — Other Ambulatory Visit: Payer: Medicare Other

## 2016-12-09 ENCOUNTER — Encounter: Payer: Medicare Other | Admitting: Physical Therapy

## 2016-12-09 VITALS — BP 143/48 | HR 74 | Temp 97.4°F | Resp 20 | Ht 60.0 in | Wt 130.7 lb

## 2016-12-09 DIAGNOSIS — M25511 Pain in right shoulder: Secondary | ICD-10-CM | POA: Diagnosis not present

## 2016-12-09 DIAGNOSIS — R700 Elevated erythrocyte sedimentation rate: Secondary | ICD-10-CM | POA: Diagnosis not present

## 2016-12-09 DIAGNOSIS — E790 Hyperuricemia without signs of inflammatory arthritis and tophaceous disease: Secondary | ICD-10-CM | POA: Diagnosis not present

## 2016-12-09 DIAGNOSIS — D890 Polyclonal hypergammaglobulinemia: Secondary | ICD-10-CM

## 2016-12-09 DIAGNOSIS — D638 Anemia in other chronic diseases classified elsewhere: Secondary | ICD-10-CM

## 2016-12-09 DIAGNOSIS — E039 Hypothyroidism, unspecified: Secondary | ICD-10-CM | POA: Diagnosis not present

## 2016-12-09 DIAGNOSIS — M2550 Pain in unspecified joint: Secondary | ICD-10-CM | POA: Diagnosis not present

## 2016-12-09 DIAGNOSIS — E1151 Type 2 diabetes mellitus with diabetic peripheral angiopathy without gangrene: Secondary | ICD-10-CM | POA: Diagnosis not present

## 2016-12-09 DIAGNOSIS — D649 Anemia, unspecified: Secondary | ICD-10-CM

## 2016-12-09 DIAGNOSIS — I10 Essential (primary) hypertension: Secondary | ICD-10-CM | POA: Diagnosis not present

## 2016-12-09 DIAGNOSIS — M1990 Unspecified osteoarthritis, unspecified site: Secondary | ICD-10-CM | POA: Diagnosis not present

## 2016-12-09 DIAGNOSIS — M79643 Pain in unspecified hand: Secondary | ICD-10-CM | POA: Diagnosis not present

## 2016-12-09 DIAGNOSIS — K219 Gastro-esophageal reflux disease without esophagitis: Secondary | ICD-10-CM | POA: Diagnosis not present

## 2016-12-09 DIAGNOSIS — M170 Bilateral primary osteoarthritis of knee: Secondary | ICD-10-CM | POA: Diagnosis not present

## 2016-12-09 DIAGNOSIS — M109 Gout, unspecified: Secondary | ICD-10-CM | POA: Diagnosis not present

## 2016-12-09 DIAGNOSIS — M7989 Other specified soft tissue disorders: Secondary | ICD-10-CM | POA: Diagnosis not present
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
## 2016-12-09 NOTE — Telephone Encounter (Signed)
Printed avs and calenderfor upcoming appointment. per 10/8 los

## 2016-12-10 NOTE — Telephone Encounter (Signed)
Request for Amendment of Health Information was Approved by Dr. Bufford Spikes.... I spoke with patients daughter gave her this information. To forward the amendment Medical Records to the daughter [name]. Sent new information to [employer], Interior and spatial designer of Medical Records at [employer]

## 2016-12-16 DIAGNOSIS — R109 Unspecified abdominal pain: Secondary | ICD-10-CM | POA: Diagnosis not present

## 2016-12-17 ENCOUNTER — Ambulatory Visit: Payer: Medicare Other | Admitting: Physical Therapy

## 2016-12-17 DIAGNOSIS — M25561 Pain in right knee: Secondary | ICD-10-CM

## 2016-12-17 DIAGNOSIS — M25661 Stiffness of right knee, not elsewhere classified: Secondary | ICD-10-CM | POA: Diagnosis not present

## 2016-12-17 DIAGNOSIS — R2681 Unsteadiness on feet: Secondary | ICD-10-CM

## 2016-12-17 DIAGNOSIS — R2689 Other abnormalities of gait and mobility: Secondary | ICD-10-CM | POA: Diagnosis not present

## 2016-12-17 DIAGNOSIS — M6281 Muscle weakness (generalized): Secondary | ICD-10-CM

## 2016-12-17 DIAGNOSIS — R278 Other lack of coordination: Secondary | ICD-10-CM

## 2016-12-17 NOTE — Addendum Note (Signed)
Addended by: Army Fossa C on: 12/17/2016 12:40 PM   Modules accepted: Orders
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
## 2016-12-18 ENCOUNTER — Telehealth: Payer: Self-pay | Admitting: General Practice

## 2016-12-18 NOTE — Telephone Encounter (Signed)
I talked with Jessica Hawkins.  I also spoke with Jessica Hawkins on Oct 12. 2018, Patients daughters.  Informed them Dr. Bufford Spikesiffany Reed has approved the amendment to Jessica Hawkins chart.  Informed them also the corrections will be sent to [facility] Medical Records department to be scanned into her chart.  Also, a copy of the corrections will be forward to the patient.Marland Kitchen.Marland Kitchen.Marland Kitchen.I apologized to the family for the inconvenient's...Marland Kitchen.Marland Kitchen.I sent info to Jessica Hawkins, Director of Medical Records at [facility]. cdavis

## 2017-01-01 DIAGNOSIS — N390 Urinary tract infection, site not specified: Secondary | ICD-10-CM | POA: Diagnosis not present

## 2017-01-01 DIAGNOSIS — E1151 Type 2 diabetes mellitus with diabetic peripheral angiopathy without gangrene: Secondary | ICD-10-CM | POA: Diagnosis not present

## 2017-01-01 DIAGNOSIS — L03116 Cellulitis of left lower limb: Secondary | ICD-10-CM | POA: Diagnosis not present

## 2017-01-01 DIAGNOSIS — M2550 Pain in unspecified joint: Secondary | ICD-10-CM | POA: Diagnosis not present

## 2017-01-01 DIAGNOSIS — I10 Essential (primary) hypertension: Secondary | ICD-10-CM | POA: Diagnosis not present

## 2017-01-06 ENCOUNTER — Ambulatory Visit (INDEPENDENT_AMBULATORY_CARE_PROVIDER_SITE_OTHER): Payer: Medicare Other | Admitting: Podiatry

## 2017-01-06 ENCOUNTER — Encounter: Payer: Self-pay | Admitting: Podiatry

## 2017-01-06 DIAGNOSIS — M1990 Unspecified osteoarthritis, unspecified site: Secondary | ICD-10-CM | POA: Diagnosis not present

## 2017-01-06 DIAGNOSIS — L84 Corns and callosities: Secondary | ICD-10-CM

## 2017-01-06 DIAGNOSIS — M25511 Pain in right shoulder: Secondary | ICD-10-CM | POA: Diagnosis not present

## 2017-01-06 DIAGNOSIS — M79643 Pain in unspecified hand: Secondary | ICD-10-CM | POA: Diagnosis not present

## 2017-01-06 DIAGNOSIS — M79676 Pain in unspecified toe(s): Secondary | ICD-10-CM

## 2017-01-06 DIAGNOSIS — M2550 Pain in unspecified joint: Secondary | ICD-10-CM | POA: Diagnosis not present

## 2017-01-06 DIAGNOSIS — Z79899 Other long term (current) drug therapy: Secondary | ICD-10-CM | POA: Diagnosis not present

## 2017-01-06 DIAGNOSIS — M7989 Other specified soft tissue disorders: Secondary | ICD-10-CM | POA: Diagnosis not present

## 2017-01-06 DIAGNOSIS — E1142 Type 2 diabetes mellitus with diabetic polyneuropathy: Secondary | ICD-10-CM

## 2017-01-06 DIAGNOSIS — M1180 Other specified crystal arthropathies, unspecified site: Secondary | ICD-10-CM | POA: Diagnosis not present

## 2017-01-06 DIAGNOSIS — M109 Gout, unspecified: Secondary | ICD-10-CM | POA: Diagnosis not present

## 2017-01-06 DIAGNOSIS — B351 Tinea unguium: Secondary | ICD-10-CM | POA: Diagnosis not present

## 2017-01-06 NOTE — Patient Instructions (Signed)
Wear the toe prop on the right foot as instructed on a daily basis  Diabetes and Foot Care Diabetes may cause you to have problems because of poor blood supply (circulation) to your feet and legs. This may cause the skin on your feet to become thinner, break easier, and heal more slowly. Your skin may become dry, and the skin may peel and crack. You may also have nerve damage in your legs and feet causing decreased feeling in them. You may not notice minor injuries to your feet that could lead to infections or more serious problems. Taking care of your feet is one of the most important things you can do for yourself. Follow these instructions at home:  Wear shoes at all times, even in the house. Do not go barefoot. Bare feet are easily injured.  Check your feet daily for blisters, cuts, and redness. If you cannot see the bottom of your feet, use a mirror or ask someone for help.  Wash your feet with warm water (do not use hot water) and mild soap. Then pat your feet and the areas between your toes until they are completely dry. Do not soak your feet as this can dry your skin.  Apply a moisturizing lotion or petroleum jelly (that does not contain alcohol and is unscented) to the skin on your feet and to dry, brittle toenails. Do not apply lotion between your toes.  Trim your toenails straight across. Do not dig under them or around the cuticle. File the edges of your nails with an emery board or nail file.  Do not cut corns or calluses or try to remove them with medicine.  Wear clean socks or stockings every day. Make sure they are not too tight. Do not wear knee-high stockings since they may decrease blood flow to your legs.  Wear shoes that fit properly and have enough cushioning. To break in new shoes, wear them for just a few hours a day. This prevents you from injuring your feet. Always look in your shoes before you put them on to be sure there are no objects inside.  Do not cross your  legs. This may decrease the blood flow to your feet.  If you find a minor scrape, cut, or break in the skin on your feet, keep it and the skin around it clean and dry. These areas may be cleansed with mild soap and water. Do not cleanse the area with peroxide, alcohol, or iodine.  When you remove an adhesive bandage, be sure not to damage the skin around it.  If you have a wound, look at it several times a day to make sure it is healing.  Do not use heating pads or hot water bottles. They may burn your skin. If you have lost feeling in your feet or legs, you may not know it is happening until it is too late.  Make sure your health care provider performs a complete foot exam at least annually or more often if you have foot problems. Report any cuts, sores, or bruises to your health care provider immediately. Contact a health care provider if:  You have an injury that is not healing.  You have cuts or breaks in the skin.  You have an ingrown nail.  You notice redness on your legs or feet.  You feel burning or tingling in your legs or feet.  You have pain or cramps in your legs and feet.  Your legs or feet are numb.  Your feet always feel cold. Get help right away if:  There is increasing redness, swelling, or pain in or around a wound.  There is a red line that goes up your leg.  Pus is coming from a wound.  You develop a fever or as directed by your health care provider.  You notice a bad smell coming from an ulcer or wound. This information is not intended to replace advice given to you by your health care provider. Make sure you discuss any questions you have with your health care provider. Document Released: 02/16/2000 Document Revised: 07/27/2015 Document Reviewed: 07/28/2012 Elsevier Interactive Patient Education  2017 [employer].

## 2017-01-06 NOTE — Progress Notes (Signed)
Patient ID: Jessica Hawkins, female   DOB: 1923/03/01, 83 y.o.   MRN: [id number]    Subjective: This patient presents today for ongoing debridement of mycotic toenails and a painful keratoses on the distal third right toe. The patient's daughter and caregiver present to treatment today Patient has pending right knee replacement  Objective: Pleasant orientated 3 Pitting edema bilaterally,left greater than right Palpable pedal pulses bilaterally Sensation to 10 g monofilament wire intact 3/5 bilaterally Vibratory sensation reactive bilaterally Ankle reflexes reactive bilaterally HAV bilaterally Hammertoe 2-4 bilaterally The toenails are extremely elongated, brittle, deformed, discolored and tender to palpation 6-10 No open skin lesions bilaterally Atrophic skin with absent hair growth bilaterally Dorsi flexion, plantar flexion 5/5 right and 5/5 left Corn third right toewith dried blood within the corn. There is no active drainage from the corn on the distal third right toe.  Assessment: Type II diabetic with a history of peripheral arterial disease and diabetic peripheral neuropathy Hammertoe third left with pre-ulcerative corn distal third left toe Symptomatic onychomycoses 6-10   Plan: Debridement toenails 6-10 mechanically and electrically without any bleeding Debrided distal keratoses third right toe without any bleeding Continue wearing the toe crest for toes 2-4 right  Reappoint 3 months

## 2017-01-07 ENCOUNTER — Ambulatory Visit: Payer: Medicare Other | Admitting: Internal Medicine

## 2017-01-08 ENCOUNTER — Ambulatory Visit (INDEPENDENT_AMBULATORY_CARE_PROVIDER_SITE_OTHER): Payer: Medicare Other | Admitting: Internal Medicine

## 2017-01-08 VITALS — BP 159/65 | HR 80 | Temp 98.3°F

## 2017-01-08 DIAGNOSIS — L03119 Cellulitis of unspecified part of limb: Secondary | ICD-10-CM
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
## 2017-01-09 DIAGNOSIS — K219 Gastro-esophageal reflux disease without esophagitis: Secondary | ICD-10-CM | POA: Diagnosis not present

## 2017-01-09 DIAGNOSIS — R4689 Other symptoms and signs involving appearance and behavior: Secondary | ICD-10-CM | POA: Diagnosis not present

## 2017-01-09 DIAGNOSIS — E1151 Type 2 diabetes mellitus with diabetic peripheral angiopathy without gangrene: Secondary | ICD-10-CM | POA: Diagnosis not present

## 2017-01-09 DIAGNOSIS — I872 Venous insufficiency (chronic) (peripheral): Secondary | ICD-10-CM | POA: Diagnosis not present

## 2017-01-09 DIAGNOSIS — E039 Hypothyroidism, unspecified: Secondary | ICD-10-CM | POA: Diagnosis not present

## 2017-01-09 DIAGNOSIS — I10 Essential (primary) hypertension: Secondary | ICD-10-CM | POA: Diagnosis not present

## 2017-01-09 DIAGNOSIS — M170 Bilateral primary osteoarthritis of knee: Secondary | ICD-10-CM | POA: Diagnosis not present

## 2017-01-09 DIAGNOSIS — N189 Chronic kidney disease, unspecified: Secondary | ICD-10-CM | POA: Diagnosis not present

## 2017-01-09 DIAGNOSIS — D509 Iron deficiency anemia, unspecified: Secondary | ICD-10-CM | POA: Diagnosis not present

## 2017-01-09 DIAGNOSIS — R4189 Other symptoms and signs involving cognitive functions and awareness: Secondary | ICD-10-CM | POA: Diagnosis not present

## 2017-01-09 DIAGNOSIS — E785 Hyperlipidemia, unspecified: Secondary | ICD-10-CM | POA: Diagnosis not present

## 2017-01-09 DIAGNOSIS — N390 Urinary tract infection, site not specified: Secondary | ICD-10-CM | POA: Diagnosis not present

## 2017-01-14 DIAGNOSIS — Z1212 Encounter for screening for malignant neoplasm of rectum: Secondary | ICD-10-CM | POA: Diagnosis not present

## 2017-01-15 DIAGNOSIS — E039 Hypothyroidism, unspecified: Secondary | ICD-10-CM | POA: Diagnosis not present

## 2017-01-15 DIAGNOSIS — R4189 Other symptoms and signs involving cognitive functions and awareness: Secondary | ICD-10-CM | POA: Diagnosis not present

## 2017-01-15 DIAGNOSIS — I10 Essential (primary) hypertension: Secondary | ICD-10-CM | POA: Diagnosis not present

## 2017-02-12 ENCOUNTER — Ambulatory Visit: Payer: Medicare Other | Admitting: Neurology

## 2017-02-12 ENCOUNTER — Telehealth: Payer: Self-pay | Admitting: *Deleted

## 2017-02-12 NOTE — Telephone Encounter (Signed)
No showed new patient appointment. 

## 2017-03-13 DIAGNOSIS — S8392XA Sprain of unspecified site of left knee, initial encounter: Secondary | ICD-10-CM | POA: Diagnosis not present

## 2017-03-13 DIAGNOSIS — Z96652 Presence of left artificial knee joint: Secondary | ICD-10-CM | POA: Diagnosis not present

## 2017-03-13 DIAGNOSIS — Z9181 History of falling: Secondary | ICD-10-CM | POA: Diagnosis not present

## 2017-03-24 DIAGNOSIS — R600 Localized edema: Secondary | ICD-10-CM | POA: Diagnosis not present

## 2017-03-24 DIAGNOSIS — M25511 Pain in right shoulder: Secondary | ICD-10-CM | POA: Diagnosis not present

## 2017-03-28 DIAGNOSIS — M7989 Other specified soft tissue disorders: Secondary | ICD-10-CM | POA: Diagnosis not present

## 2017-03-28 DIAGNOSIS — M1990 Unspecified osteoarthritis, unspecified site: Secondary | ICD-10-CM | POA: Diagnosis not present

## 2017-03-28 DIAGNOSIS — Z79899 Other long term (current) drug therapy: Secondary | ICD-10-CM | POA: Diagnosis not present

## 2017-03-28 DIAGNOSIS — M25511 Pain in right shoulder: Secondary | ICD-10-CM | POA: Diagnosis not present

## 2017-03-28 DIAGNOSIS — M2550 Pain in unspecified joint: Secondary | ICD-10-CM | POA: Diagnosis not present

## 2017-03-28 DIAGNOSIS — M1180 Other specified crystal arthropathies, unspecified site: Secondary | ICD-10-CM | POA: Diagnosis not present

## 2017-03-28 DIAGNOSIS — M79643 Pain in unspecified hand: Secondary | ICD-10-CM | POA: Diagnosis not present

## 2017-03-28 DIAGNOSIS — M7581 Other shoulder lesions, right shoulder: Secondary | ICD-10-CM | POA: Diagnosis not present

## 2017-03-28 DIAGNOSIS — M109 Gout, unspecified: Secondary | ICD-10-CM | POA: Diagnosis not present

## 2017-04-07 ENCOUNTER — Ambulatory Visit: Payer: Medicare Other | Admitting: Podiatry

## 2017-04-08 ENCOUNTER — Ambulatory Visit (INDEPENDENT_AMBULATORY_CARE_PROVIDER_SITE_OTHER): Payer: Medicare Other | Admitting: Neurology

## 2017-04-08 ENCOUNTER — Encounter: Payer: Self-pay | Admitting: Neurology

## 2017-04-08 VITALS — BP 129/60 | HR 68

## 2017-04-08 DIAGNOSIS — R413 Other amnesia: Secondary | ICD-10-CM | POA: Diagnosis not present

## 2017-04-08 DIAGNOSIS — F0390 Unspecified dementia without behavioral disturbance: Secondary | ICD-10-CM | POA: Insufficient documentation

## 2017-04-08 MED ORDER — MEMANTINE HCL 10 MG PO TABS: 10.0000 mg | ORAL_TABLET | Freq: Two times a day (BID) | ORAL | 11 refills | Status: DC
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
## 2017-04-08 NOTE — Patient Instructions (Signed)
Continue healthy life style,  Exercise, Reading books.  Try Namenda 10mg  twice a day.

## 2017-04-09 ENCOUNTER — Ambulatory Visit (INDEPENDENT_AMBULATORY_CARE_PROVIDER_SITE_OTHER): Payer: Medicare Other | Admitting: Podiatry

## 2017-04-09 DIAGNOSIS — E0843 Diabetes mellitus due to underlying condition with diabetic autonomic (poly)neuropathy: Secondary | ICD-10-CM

## 2017-04-09 DIAGNOSIS — L989 Disorder of the skin and subcutaneous tissue, unspecified: Secondary | ICD-10-CM

## 2017-04-09 DIAGNOSIS — B351 Tinea unguium: Secondary | ICD-10-CM | POA: Diagnosis not present

## 2017-04-09 DIAGNOSIS — M79676 Pain in unspecified toe(s): Secondary | ICD-10-CM | POA: Diagnosis not present

## 2017-04-09 LAB — CK: Total CK: 63 U/L (ref 24–173)

## 2017-04-09 LAB — C-REACTIVE PROTEIN: CRP: 9.7 mg/L — ABNORMAL HIGH (ref 0.0–4.9)

## 2017-04-09 LAB — SEDIMENTATION RATE: Sed Rate: 32 mm/hr (ref 0–40)

## 2017-04-09 LAB — VITAMIN B12: Vitamin B-12: 951 pg/mL (ref 232–1245)

## 2017-04-09 LAB — RPR: RPR Ser Ql: NONREACTIVE

## 2017-04-10 DIAGNOSIS — N189 Chronic kidney disease, unspecified: Secondary | ICD-10-CM | POA: Diagnosis not present

## 2017-04-10 DIAGNOSIS — I10 Essential (primary) hypertension: Secondary | ICD-10-CM | POA: Diagnosis not present

## 2017-04-10 DIAGNOSIS — D631 Anemia in chronic kidney disease: Secondary | ICD-10-CM | POA: Diagnosis not present

## 2017-04-10 DIAGNOSIS — M1990 Unspecified osteoarthritis, unspecified site: Secondary | ICD-10-CM | POA: Diagnosis not present

## 2017-04-10 DIAGNOSIS — E785 Hyperlipidemia, unspecified: Secondary | ICD-10-CM | POA: Diagnosis not present

## 2017-04-10 DIAGNOSIS — E1151 Type 2 diabetes mellitus with diabetic peripheral angiopathy without gangrene: Secondary | ICD-10-CM | POA: Diagnosis not present

## 2017-04-10 DIAGNOSIS — M170 Bilateral primary osteoarthritis of knee: Secondary | ICD-10-CM | POA: Diagnosis not present

## 2017-04-10 DIAGNOSIS — E039 Hypothyroidism, unspecified: Secondary | ICD-10-CM | POA: Diagnosis not present

## 2017-04-11 LAB — TSH

## 2017-04-12 LAB — SPECIMEN STATUS REPORT

## 2017-04-12 LAB — TSH: TSH: 0.78 u[IU]/mL (ref 0.450–4.500)
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
## 2017-04-23 ENCOUNTER — Other Ambulatory Visit: Payer: Medicare Other

## 2017-04-25 ENCOUNTER — Ambulatory Visit
Admission: RE | Admit: 2017-04-25 | Discharge: 2017-04-25 | Disposition: A | Discharge status: Home / Self Care | Payer: Medicare Other | Source: Ambulatory Visit | Attending: Neurology | Admitting: Neurology

## 2017-04-25 DIAGNOSIS — R413 Other amnesia: Secondary | ICD-10-CM | POA: Diagnosis not present

## 2017-04-28 ENCOUNTER — Telehealth: Payer: Self-pay | Admitting: Neurology

## 2017-04-28 NOTE — Telephone Encounter (Signed)
Please call patient, MRI of brain showed generalized atrophy and supratentorium small vessel disease.  There are no acute abnormalities.  Will review films at next followup visit.  IMPRESSION:  This MRI of the brain without contrast shows the following: 1.   Mild generalized cortical atrophy that is more severe in the mesial temporal lobes. Most of this atrophy has occurred since the 2013 CT scan. 2.   Scattered T2/flair hyperintense foci in the hemispheres and I basal ganglia in a pattern consistent with chronic microvascular ischemic changes. None of the foci appears to be acute.

## 2017-04-28 NOTE — Telephone Encounter (Signed)
Spoke to her daughter, Windell MouldingRuth, on HIPAA - she is aware of results.  Her mother has started the memantine and is tolerating the medication without adverse side effects.  She has a pending appt in May.

## 2017-04-30 DIAGNOSIS — M6281 Muscle weakness (generalized): Secondary | ICD-10-CM | POA: Diagnosis not present

## 2017-04-30 DIAGNOSIS — R269 Unspecified abnormalities of gait and mobility: Secondary | ICD-10-CM | POA: Diagnosis not present

## 2017-05-06 DIAGNOSIS — M109 Gout, unspecified: Secondary | ICD-10-CM | POA: Diagnosis not present

## 2017-05-06 DIAGNOSIS — Z01419 Encounter for gynecological examination (general) (routine) without abnormal findings: Secondary | ICD-10-CM | POA: Diagnosis not present

## 2017-05-06 DIAGNOSIS — R300 Dysuria: Secondary | ICD-10-CM | POA: Diagnosis not present

## 2017-05-07 ENCOUNTER — Ambulatory Visit (INDEPENDENT_AMBULATORY_CARE_PROVIDER_SITE_OTHER): Payer: Medicare Other | Admitting: Podiatry

## 2017-05-07 DIAGNOSIS — S90221A Contusion of right lesser toe(s) with damage to nail, initial encounter: Secondary | ICD-10-CM | POA: Diagnosis not present

## 2017-05-07 DIAGNOSIS — R269 Unspecified abnormalities of gait and mobility: Secondary | ICD-10-CM | POA: Diagnosis not present

## 2017-05-07 DIAGNOSIS — R300 Dysuria: Secondary | ICD-10-CM | POA: Diagnosis not present

## 2017-05-07 DIAGNOSIS — M6281 Muscle weakness (generalized): Secondary | ICD-10-CM | POA: Diagnosis not present

## 2017-05-07 DIAGNOSIS — N390 Urinary tract infection, site not specified: Secondary | ICD-10-CM | POA: Diagnosis not present
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
## 2017-05-21 DIAGNOSIS — R269 Unspecified abnormalities of gait and mobility: Secondary | ICD-10-CM | POA: Diagnosis not present

## 2017-05-21 DIAGNOSIS — M6281 Muscle weakness (generalized): Secondary | ICD-10-CM | POA: Diagnosis not present

## 2017-05-22 ENCOUNTER — Other Ambulatory Visit: Payer: Self-pay | Admitting: Internal Medicine

## 2017-05-22 DIAGNOSIS — Z1231 Encounter for screening mammogram for malignant neoplasm of breast: Secondary | ICD-10-CM

## 2017-05-28 DIAGNOSIS — M6281 Muscle weakness (generalized): Secondary | ICD-10-CM | POA: Diagnosis not present

## 2017-05-28 DIAGNOSIS — R269 Unspecified abnormalities of gait and mobility: Secondary | ICD-10-CM | POA: Diagnosis not present

## 2017-06-03 ENCOUNTER — Other Ambulatory Visit: Payer: Self-pay

## 2017-06-03 DIAGNOSIS — R4189 Other symptoms and signs involving cognitive functions and awareness: Secondary | ICD-10-CM

## 2017-06-03 DIAGNOSIS — R413 Other amnesia: Secondary | ICD-10-CM

## 2017-06-03 DIAGNOSIS — D638 Anemia in other chronic diseases classified elsewhere: Secondary | ICD-10-CM

## 2017-06-04 ENCOUNTER — Inpatient Hospital Stay: Payer: Medicare Other | Attending: Hematology

## 2017-06-04 DIAGNOSIS — R413 Other amnesia: Secondary | ICD-10-CM

## 2017-06-04 DIAGNOSIS — D649 Anemia, unspecified: Secondary | ICD-10-CM | POA: Insufficient documentation

## 2017-06-04 DIAGNOSIS — D638 Anemia in other chronic diseases classified elsewhere: Secondary | ICD-10-CM

## 2017-06-04 LAB — CBC WITH DIFFERENTIAL (CANCER CENTER ONLY)
BASOS ABS: 0 10*3/uL (ref 0.0–0.1)
Basophils Relative: 1 %
EOS PCT: 3 %
Eosinophils Absolute: 0.1 10*3/uL (ref 0.0–0.5)
HEMATOCRIT: 37.5 % (ref 34.8–46.6)
Hemoglobin: 11.8 g/dL (ref 11.6–15.9)
LYMPHS PCT: 33 %
Lymphs Abs: 1.4 10*3/uL (ref 0.9–3.3)
MCH: 26.6 pg (ref 25.1–34.0)
MCHC: 31.5 g/dL (ref 31.5–36.0)
MCV: 84.7 fL (ref 79.5–101.0)
Monocytes Absolute: 0.4 10*3/uL (ref 0.1–0.9)
Monocytes Relative: 10 %
NEUTROS ABS: 2.3 10*3/uL (ref 1.5–6.5)
NEUTROS PCT: 53 %
PLATELETS: 206 10*3/uL (ref 145–400)
RBC: 4.43 MIL/uL (ref 3.70–5.45)
RDW: 15.8 % — ABNORMAL HIGH (ref 11.2–14.5)
WBC: 4.2 10*3/uL (ref 3.9–10.3)

## 2017-06-04 LAB — CMP (CANCER CENTER ONLY)
ALT: 14 U/L (ref 0–55)
AST: 25 U/L (ref 5–34)
Albumin: 3.5 g/dL (ref 3.5–5.0)
Alkaline Phosphatase: 110 U/L (ref 40–150)
Anion gap: 9 (ref 3–11)
BUN: 31 mg/dL — AB (ref 7–26)
CHLORIDE: 100 mmol/L (ref 98–109)
CO2: 29 mmol/L (ref 22–29)
CREATININE: 0.94 mg/dL (ref 0.60–1.10)
Calcium: 9.9 mg/dL (ref 8.4–10.4)
GFR, EST NON AFRICAN AMERICAN: 51 mL/min — AB (ref >60)
GFR, Est AFR Am: 59 mL/min — ABNORMAL LOW (ref >60)
Glucose, Bld: 109 mg/dL (ref 70–140)
Potassium: 4.6 mmol/L (ref 3.5–5.1)
Sodium: 138 mmol/L (ref 136–145)
Total Bilirubin: 0.4 mg/dL (ref 0.2–1.2)
Total Protein: 8 g/dL (ref 6.4–8.3)

## 2017-06-04 LAB — IRON AND TIBC
Iron: 75 ug/dL (ref 41–142)
Saturation Ratios: 33 % (ref 21–57)
TIBC: 226 ug/dL — ABNORMAL LOW (ref 236–444)
UIBC: 151 ug/dL

## 2017-06-04 LAB — FERRITIN: FERRITIN: 723 ng/mL — AB (ref 9–269)

## 2017-06-04 LAB — VITAMIN B12: VITAMIN B 12: 1355 pg/mL — AB (ref 180–914)

## 2017-06-04 LAB — SEDIMENTATION RATE: Sed Rate: 43 mm/hr — ABNORMAL HIGH (ref 0–22)

## 2017-06-04 LAB — TSH: TSH: 0.974 u[IU]/mL (ref 0.308–3.960)

## 2017-06-05 DIAGNOSIS — M6281 Muscle weakness (generalized): Secondary | ICD-10-CM | POA: Diagnosis not present

## 2017-06-05 DIAGNOSIS — R269 Unspecified abnormalities of gait and mobility: Secondary | ICD-10-CM | POA: Diagnosis not present

## 2017-06-09 ENCOUNTER — Ambulatory Visit: Payer: Medicare Other | Admitting: Hematology

## 2017-06-09 ENCOUNTER — Other Ambulatory Visit: Payer: Medicare Other

## 2017-06-09 DIAGNOSIS — R269 Unspecified abnormalities of gait and mobility: Secondary | ICD-10-CM | POA: Diagnosis not present

## 2017-06-09 DIAGNOSIS — M6281 Muscle weakness (generalized): Secondary | ICD-10-CM | POA: Diagnosis not present

## 2017-06-12 ENCOUNTER — Telehealth: Payer: Self-pay | Admitting: *Deleted

## 2017-06-12 NOTE — Telephone Encounter (Signed)
Per last OV, patient should keep appointments on an as needed basis.  Recent lab showed Hgb 11.8 WNL.  SW pt's daughter, daughter stated patient has been feeling well, does not feel patient needs to be seen by Dr. Candise CheKale tomorrow.  Advised daughter that patient should follow up on Hgb levels with PCP, Dr. Candise CheKale will be happy to see patient back if any changes/concerns arise.  Daughter in agreement with plan.

## 2017-06-13 ENCOUNTER — Ambulatory Visit: Payer: Medicare Other | Admitting: Hematology

## 2017-06-14 ENCOUNTER — Other Ambulatory Visit: Payer: Self-pay

## 2017-06-14 ENCOUNTER — Ambulatory Visit (HOSPITAL_COMMUNITY)
Admission: EM | Admit: 2017-06-14 | Discharge: 2017-06-14 | Disposition: A | Discharge status: Home / Self Care | Payer: Medicare Other | Attending: Family Medicine | Admitting: Family Medicine

## 2017-06-14 ENCOUNTER — Ambulatory Visit (INDEPENDENT_AMBULATORY_CARE_PROVIDER_SITE_OTHER): Payer: Medicare Other

## 2017-06-14 ENCOUNTER — Encounter (HOSPITAL_COMMUNITY): Payer: Self-pay | Admitting: *Deleted

## 2017-06-14 DIAGNOSIS — R05 Cough: Secondary | ICD-10-CM

## 2017-06-14 DIAGNOSIS — R059 Cough, unspecified: Secondary | ICD-10-CM

## 2017-06-14 MED ORDER — FLUTICASONE-SALMETEROL 100-50 MCG/DOSE IN AEPB: 1.0000 | INHALATION_SPRAY | Freq: Two times a day (BID) | RESPIRATORY_TRACT | 0 refills | Status: DC

## 2017-06-14 NOTE — ED Triage Notes (Signed)
Cough, congestion fatigue, lethargic, mucus in chest and she just can't seem to get out
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
## 2017-06-15 ENCOUNTER — Telehealth (HOSPITAL_COMMUNITY): Payer: Self-pay | Admitting: Emergency Medicine

## 2017-06-15 MED ORDER — FLUTICASONE-SALMETEROL 100-50 MCG/DOSE IN AEPB: 1.0000 | INHALATION_SPRAY | Freq: Two times a day (BID) | RESPIRATORY_TRACT | 0 refills | Status: DC

## 2017-06-17 DIAGNOSIS — L03116 Cellulitis of left lower limb: Secondary | ICD-10-CM | POA: Diagnosis not present

## 2017-06-24 DIAGNOSIS — R600 Localized edema: Secondary | ICD-10-CM | POA: Diagnosis not present

## 2017-06-24 DIAGNOSIS — L0390 Cellulitis, unspecified: Secondary | ICD-10-CM | POA: Diagnosis not present

## 2017-06-25 DIAGNOSIS — M6281 Muscle weakness (generalized): Secondary | ICD-10-CM | POA: Diagnosis not present

## 2017-06-25 DIAGNOSIS — R269 Unspecified abnormalities of gait and mobility: Secondary | ICD-10-CM | POA: Diagnosis not present

## 2017-07-01 ENCOUNTER — Ambulatory Visit
Admission: RE | Admit: 2017-07-01 | Discharge: 2017-07-01 | Disposition: A | Discharge status: Home / Self Care | Payer: Medicare Other | Source: Ambulatory Visit | Attending: Internal Medicine | Admitting: Internal Medicine

## 2017-07-01 DIAGNOSIS — Z1231 Encounter for screening mammogram for malignant neoplasm of breast: Secondary | ICD-10-CM

## 2017-07-09 ENCOUNTER — Ambulatory Visit: Payer: Medicare Other | Admitting: Podiatry

## 2017-07-09 DIAGNOSIS — L03116 Cellulitis of left lower limb: Secondary | ICD-10-CM | POA: Diagnosis not present

## 2017-07-09 DIAGNOSIS — M6281 Muscle weakness (generalized): Secondary | ICD-10-CM | POA: Diagnosis not present

## 2017-07-09 DIAGNOSIS — R269 Unspecified abnormalities of gait and mobility: Secondary | ICD-10-CM | POA: Diagnosis not present

## 2017-07-09 DIAGNOSIS — R609 Edema, unspecified: Secondary | ICD-10-CM | POA: Diagnosis not present

## 2017-07-14 ENCOUNTER — Encounter: Payer: Self-pay | Admitting: Podiatry

## 2017-07-14 ENCOUNTER — Ambulatory Visit (INDEPENDENT_AMBULATORY_CARE_PROVIDER_SITE_OTHER): Payer: Medicare Other | Admitting: Podiatry

## 2017-07-14 DIAGNOSIS — B351 Tinea unguium: Secondary | ICD-10-CM | POA: Diagnosis not present

## 2017-07-14 DIAGNOSIS — L989 Disorder of the skin and subcutaneous tissue, unspecified: Secondary | ICD-10-CM

## 2017-07-14 DIAGNOSIS — M79676 Pain in unspecified toe(s): Secondary | ICD-10-CM

## 2017-07-16 ENCOUNTER — Ambulatory Visit (INDEPENDENT_AMBULATORY_CARE_PROVIDER_SITE_OTHER): Payer: Medicare Other | Admitting: Neurology

## 2017-07-16 ENCOUNTER — Encounter: Payer: Self-pay | Admitting: Neurology

## 2017-07-16 VITALS — Ht 60.0 in | Wt 133.0 lb

## 2017-07-16 DIAGNOSIS — M6281 Muscle weakness (generalized): Secondary | ICD-10-CM | POA: Diagnosis not present

## 2017-07-16 DIAGNOSIS — F0390 Unspecified dementia without behavioral disturbance: Secondary | ICD-10-CM

## 2017-07-16 DIAGNOSIS — R269 Unspecified abnormalities of gait and mobility: Secondary | ICD-10-CM | POA: Diagnosis not present
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
## 2017-07-17 DIAGNOSIS — M1990 Unspecified osteoarthritis, unspecified site: Secondary | ICD-10-CM | POA: Diagnosis not present

## 2017-07-17 DIAGNOSIS — I10 Essential (primary) hypertension: Secondary | ICD-10-CM | POA: Diagnosis not present

## 2017-07-17 DIAGNOSIS — N189 Chronic kidney disease, unspecified: Secondary | ICD-10-CM | POA: Diagnosis not present

## 2017-07-17 DIAGNOSIS — M25511 Pain in right shoulder: Secondary | ICD-10-CM | POA: Diagnosis not present

## 2017-07-25 ENCOUNTER — Ambulatory Visit: Payer: Medicare Other

## 2017-07-30 ENCOUNTER — Ambulatory Visit: Payer: Medicare Other

## 2017-07-30 DIAGNOSIS — R197 Diarrhea, unspecified: Secondary | ICD-10-CM | POA: Diagnosis not present

## 2017-08-11 ENCOUNTER — Ambulatory Visit: Payer: Medicare Other | Admitting: Podiatry

## 2017-08-26 ENCOUNTER — Ambulatory Visit
Admission: RE | Admit: 2017-08-26 | Discharge: 2017-08-26 | Disposition: A | Discharge status: Home / Self Care | Payer: Medicare Other | Source: Ambulatory Visit | Attending: Internal Medicine | Admitting: Internal Medicine

## 2017-08-26 DIAGNOSIS — Z1231 Encounter for screening mammogram for malignant neoplasm of breast: Secondary | ICD-10-CM | POA: Diagnosis not present

## 2017-09-16 DIAGNOSIS — M7581 Other shoulder lesions, right shoulder: Secondary | ICD-10-CM | POA: Diagnosis not present

## 2017-09-16 DIAGNOSIS — M2550 Pain in unspecified joint: Secondary | ICD-10-CM | POA: Diagnosis not present

## 2017-09-16 DIAGNOSIS — M1990 Unspecified osteoarthritis, unspecified site: Secondary | ICD-10-CM | POA: Diagnosis not present

## 2017-09-16 DIAGNOSIS — M109 Gout, unspecified: Secondary | ICD-10-CM | POA: Diagnosis not present

## 2017-09-16 DIAGNOSIS — M25511 Pain in right shoulder: Secondary | ICD-10-CM | POA: Diagnosis not present

## 2017-09-16 DIAGNOSIS — Z79899 Other long term (current) drug therapy: Secondary | ICD-10-CM | POA: Diagnosis not present

## 2017-09-16 DIAGNOSIS — M79643 Pain in unspecified hand: Secondary | ICD-10-CM | POA: Diagnosis not present

## 2017-09-16 DIAGNOSIS — M1180 Other specified crystal arthropathies, unspecified site: Secondary | ICD-10-CM | POA: Diagnosis not present

## 2017-10-06 DIAGNOSIS — M1990 Unspecified osteoarthritis, unspecified site: Secondary | ICD-10-CM | POA: Diagnosis not present

## 2017-10-06 DIAGNOSIS — L03116 Cellulitis of left lower limb: Secondary | ICD-10-CM | POA: Diagnosis not present

## 2017-10-06 DIAGNOSIS — E1151 Type 2 diabetes mellitus with diabetic peripheral angiopathy without gangrene: Secondary | ICD-10-CM | POA: Diagnosis not present

## 2017-10-06 DIAGNOSIS — I10 Essential (primary) hypertension: Secondary | ICD-10-CM | POA: Diagnosis not present

## 2017-10-13 ENCOUNTER — Encounter: Payer: Medicare Other | Admitting: Podiatry

## 2017-10-14 ENCOUNTER — Encounter: Payer: Self-pay | Admitting: Podiatry

## 2017-10-14 ENCOUNTER — Ambulatory Visit (INDEPENDENT_AMBULATORY_CARE_PROVIDER_SITE_OTHER): Payer: Medicare Other | Admitting: Podiatry

## 2017-10-14 DIAGNOSIS — B351 Tinea unguium: Secondary | ICD-10-CM | POA: Diagnosis not present

## 2017-10-14 DIAGNOSIS — M79676 Pain in unspecified toe(s): Secondary | ICD-10-CM | POA: Diagnosis not present

## 2017-10-14 DIAGNOSIS — L84 Corns and callosities: Secondary | ICD-10-CM | POA: Diagnosis not present

## 2017-10-14 DIAGNOSIS — E0843 Diabetes mellitus due to underlying condition with diabetic autonomic (poly)neuropathy: Secondary | ICD-10-CM | POA: Diagnosis not present

## 2017-10-14 NOTE — Progress Notes (Signed)
Complaint:  Visit Type: Patient returns to my office for continued preventative foot care services. Complaint: Patient states" my nails have grown long and thick and become painful to walk and wear shoes" Patient has been diagnosed with DM with no foot complications. The patient presents for preventative foot care services. No changes to ROS.  Patient has painful clavi 3rd toe right foot. Podiatric Exam: Vascular: dorsalis pedis and posterior tibial pulses are palpable bilateral. Capillary return is immediate. Temperature gradient is WNL. Skin turgor WNL  Sensorium: Normal Semmes Weinstein monofilament test. Normal tactile sensation bilaterally. Nail Exam: Pt has thick disfigured discolored nails with subungual debris noted bilateral entire nail hallux through fifth toenails Ulcer Exam: There is no evidence of ulcer or pre-ulcerative changes or infection. Orthopedic Exam: Muscle tone and strength are WNL. No limitations in general ROM. No crepitus or effusions noted. Foot type and digits show no abnormalities. Bony prominences are unremarkable. Skin: No Porokeratosis. No infection or ulcers  Diagnosis:  Onychomycosis, , Pain in right toe, pain in left toes  Treatment & Plan Procedures and Treatment: Consent by patient was obtained for treatment procedures.   Debridement of mycotic and hypertrophic toenails, 1 through 5 bilateral and clearing of subungual debris. No ulceration, no infection noted. Debride clavi.  Padding. Return Visit-Office Procedure: Patient instructed to return to the office for a follow up visit 3 months for continued evaluation and treatment.    Helane GuntherGregory Sayeed Weatherall DPM

## 2017-10-20 NOTE — Progress Notes (Signed)
This encounter was created in error - please disregard.

## 2017-10-29 DIAGNOSIS — M533 Sacrococcygeal disorders, not elsewhere classified: Secondary | ICD-10-CM | POA: Diagnosis not present

## 2017-11-03 ENCOUNTER — Other Ambulatory Visit: Payer: Self-pay

## 2017-11-03 ENCOUNTER — Emergency Department (HOSPITAL_COMMUNITY)
Admission: EM | Admit: 2017-11-03 | Discharge: 2017-11-03 | Disposition: A | Discharge status: Home / Self Care | Payer: Medicare Other | Attending: Emergency Medicine | Admitting: Emergency Medicine

## 2017-11-03 ENCOUNTER — Encounter (HOSPITAL_COMMUNITY): Payer: Self-pay | Admitting: Oncology

## 2017-11-03 DIAGNOSIS — Z79899 Other long term (current) drug therapy: Secondary | ICD-10-CM | POA: Insufficient documentation

## 2017-11-03 DIAGNOSIS — I129 Hypertensive chronic kidney disease with stage 1 through stage 4 chronic kidney disease, or unspecified chronic kidney disease: Secondary | ICD-10-CM | POA: Insufficient documentation

## 2017-11-03 DIAGNOSIS — E119 Type 2 diabetes mellitus without complications: Secondary | ICD-10-CM | POA: Diagnosis not present

## 2017-11-03 DIAGNOSIS — N189 Chronic kidney disease, unspecified: Secondary | ICD-10-CM | POA: Insufficient documentation

## 2017-11-03 DIAGNOSIS — R040 Epistaxis: Secondary | ICD-10-CM | POA: Insufficient documentation

## 2017-11-03 DIAGNOSIS — I959 Hypotension, unspecified: Secondary | ICD-10-CM | POA: Diagnosis not present

## 2017-11-03 MED ORDER — OXYMETAZOLINE HCL 0.05 % NA SOLN
1.0000 | Freq: Once | NASAL | Status: AC
  Administered 2017-11-03: 1 via NASAL
  Filled 2017-11-03: qty 15
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
## 2017-11-06 DIAGNOSIS — J343 Hypertrophy of nasal turbinates: Secondary | ICD-10-CM | POA: Diagnosis not present

## 2017-11-06 DIAGNOSIS — Z974 Presence of external hearing-aid: Secondary | ICD-10-CM | POA: Diagnosis not present

## 2017-11-06 DIAGNOSIS — R040 Epistaxis: Secondary | ICD-10-CM | POA: Diagnosis not present

## 2017-11-06 DIAGNOSIS — J3489 Other specified disorders of nose and nasal sinuses: Secondary | ICD-10-CM | POA: Diagnosis not present

## 2017-11-20 DIAGNOSIS — J3489 Other specified disorders of nose and nasal sinuses: Secondary | ICD-10-CM | POA: Diagnosis not present

## 2017-11-20 DIAGNOSIS — R040 Epistaxis: Secondary | ICD-10-CM | POA: Diagnosis not present

## 2017-11-28 DIAGNOSIS — Z23 Encounter for immunization: Secondary | ICD-10-CM | POA: Diagnosis not present

## 2017-12-19 ENCOUNTER — Ambulatory Visit (INDEPENDENT_AMBULATORY_CARE_PROVIDER_SITE_OTHER): Payer: Medicare Other | Admitting: Podiatry

## 2017-12-19 DIAGNOSIS — B351 Tinea unguium: Secondary | ICD-10-CM | POA: Diagnosis not present

## 2017-12-19 DIAGNOSIS — E0843 Diabetes mellitus due to underlying condition with diabetic autonomic (poly)neuropathy: Secondary | ICD-10-CM

## 2017-12-19 DIAGNOSIS — M79676 Pain in unspecified toe(s): Secondary | ICD-10-CM

## 2017-12-31 DIAGNOSIS — R040 Epistaxis: Secondary | ICD-10-CM | POA: Diagnosis not present
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
## 2018-01-13 DIAGNOSIS — N390 Urinary tract infection, site not specified: Secondary | ICD-10-CM | POA: Diagnosis not present

## 2018-01-13 DIAGNOSIS — E1151 Type 2 diabetes mellitus with diabetic peripheral angiopathy without gangrene: Secondary | ICD-10-CM | POA: Diagnosis not present

## 2018-01-13 DIAGNOSIS — R319 Hematuria, unspecified: Secondary | ICD-10-CM | POA: Diagnosis not present

## 2018-01-13 DIAGNOSIS — Z7982 Long term (current) use of aspirin: Secondary | ICD-10-CM | POA: Diagnosis not present

## 2018-01-13 DIAGNOSIS — I10 Essential (primary) hypertension: Secondary | ICD-10-CM | POA: Diagnosis not present

## 2018-01-14 ENCOUNTER — Ambulatory Visit: Payer: Medicare Other | Admitting: Podiatry

## 2018-01-16 DIAGNOSIS — R040 Epistaxis: Secondary | ICD-10-CM | POA: Diagnosis not present

## 2018-02-12 DIAGNOSIS — K219 Gastro-esophageal reflux disease without esophagitis: Secondary | ICD-10-CM | POA: Diagnosis not present

## 2018-02-12 DIAGNOSIS — R319 Hematuria, unspecified: Secondary | ICD-10-CM | POA: Diagnosis not present

## 2018-02-12 DIAGNOSIS — R040 Epistaxis: Secondary | ICD-10-CM | POA: Diagnosis not present

## 2018-02-12 DIAGNOSIS — I10 Essential (primary) hypertension: Secondary | ICD-10-CM | POA: Diagnosis not present

## 2018-02-12 DIAGNOSIS — Z0000 Encounter for general adult medical examination without abnormal findings: Secondary | ICD-10-CM | POA: Diagnosis not present

## 2018-02-12 DIAGNOSIS — E875 Hyperkalemia: Secondary | ICD-10-CM | POA: Diagnosis not present

## 2018-02-12 DIAGNOSIS — E039 Hypothyroidism, unspecified: Secondary | ICD-10-CM | POA: Diagnosis not present

## 2018-02-12 DIAGNOSIS — D631 Anemia in chronic kidney disease: Secondary | ICD-10-CM | POA: Diagnosis not present

## 2018-02-20 ENCOUNTER — Ambulatory Visit (INDEPENDENT_AMBULATORY_CARE_PROVIDER_SITE_OTHER): Payer: Medicare Other | Admitting: Podiatry

## 2018-02-20 DIAGNOSIS — M79676 Pain in unspecified toe(s): Secondary | ICD-10-CM

## 2018-02-20 DIAGNOSIS — B351 Tinea unguium: Secondary | ICD-10-CM | POA: Diagnosis not present

## 2018-02-20 NOTE — Patient Instructions (Signed)
Onychomycosis/Fungal Toenails  WHAT IS IT? An infection that lies within the keratin of your nail plate that is caused by a fungus.  WHY ME? Fungal infections affect all ages, sexes, races, and creeds.  There may be many factors that predispose you to a fungal infection such as age, coexisting medical conditions such as diabetes, or an autoimmune disease; stress, medications, fatigue, genetics, etc.  Bottom line: fungus thrives in a warm, moist environment and your shoes offer such a location.  IS IT CONTAGIOUS? Theoretically, yes.  You do not want to share shoes, nail clippers or files with someone who has fungal toenails.  Walking around barefoot in the same room or sleeping in the same bed is unlikely to transfer the organism.  It is important to realize, however, that fungus can spread easily from one nail to the next on the same foot.  HOW DO WE TREAT THIS?  There are several ways to treat this condition.  Treatment may depend on many factors such as age, medications, pregnancy, liver and kidney conditions, etc.  It is best to ask your doctor which options are available to you.  1. No treatment.   Unlike many other medical concerns, you can live with this condition.  However for many people this can be a painful condition and may lead to ingrown toenails or a bacterial infection.  It is recommended that you keep the nails cut short to help reduce the amount of fungal nail. 2. Topical treatment.  These range from herbal remedies to prescription strength nail lacquers.  About 40-50% effective, topicals require twice daily application for approximately 9 to 12 months or until an entirely new nail has grown out.  The most effective topicals are medical grade medications available through physicians offices. 3. Oral antifungal medications.  With an 80-90% cure rate, the most common oral medication requires 3 to 4 months of therapy and stays in your system for a year as the new nail grows out.  Oral  antifungal medications do require blood work to make sure it is a safe drug for you.  A liver function panel will be performed prior to starting the medication and after the first month of treatment.  It is important to have the blood work performed to avoid any harmful side effects.  In general, this medication safe but blood work is required. 4. Laser Therapy.  This treatment is performed by applying a specialized laser to the affected nail plate.  This therapy is noninvasive, fast, and non-painful.  It is not covered by insurance and is therefore, out of pocket.  The results have been very good with a 80-95% cure rate.  [facility] is the only practice in the area to offer this therapy. 5. Permanent Nail Avulsion.  Removing the entire nail so that a new nail will not grow back.  Corns and Calluses Corns are small areas of thickened skin that occur on the top, sides, or tip of a toe. They contain a cone-shaped core with a point that can press on a nerve below. This causes pain.  Calluses are areas of thickened skin that can occur anywhere on the body, including the hands, fingers, palms, soles of the feet, and heels. Calluses are usually larger than corns. What are the causes? Corns and calluses are caused by rubbing (friction) or pressure, such as from shoes that are too tight or do not fit properly. What increases the risk? Corns are more likely to develop in people   who have misshapen toes (toe deformities), such as hammer toes. Calluses can occur with friction to any area of the skin. They are more likely to develop in people who:  Work with their hands.  Wear shoes that fit poorly, are too tight, or are high-heeled.  Have toe deformities. What are the signs or symptoms? Symptoms of a corn or callus include:  A hard growth on the skin.  Pain or tenderness under the skin.  Redness and swelling.  Increased discomfort while wearing tight-fitting shoes, if your feet are  affected. If a corn or callus becomes infected, symptoms may include:  Redness and swelling that gets worse.  Pain.  Fluid, blood, or pus draining from the corn or callus. How is this diagnosed? Corns and calluses may be diagnosed based on your symptoms, your medical history, and a physical exam. How is this treated? Treatment for corns and calluses may include:  Removing the cause of the friction or pressure. This may involve: ? Changing your shoes. ? Wearing shoe inserts (orthotics) or other protective layers in your shoes, such as a corn pad. ? Wearing gloves.  Applying medicine to the skin (topical medicine) to help soften skin in the hardened, thickened areas.  Removing layers of dead skin with a file to reduce the size of the corn or callus.  Removing the corn or callus with a scalpel or laser.  Taking antibiotic medicines, if your corn or callus is infected.  Having surgery, if a toe deformity is the cause. Follow these instructions at home:   Take over-the-counter and prescription medicines only as told by your health care provider.  If you were prescribed an antibiotic, take it as told by your health care provider. Do not stop taking it even if your condition starts to improve.  Wear shoes that fit well. Avoid wearing high-heeled shoes and shoes that are too tight or too loose.  Wear any padding, protective layers, gloves, or orthotics as told by your health care provider.  Soak your hands or feet and then use a file or pumice stone to soften your corn or callus. Do this as told by your health care provider.  Check your corn or callus every day for symptoms of infection. Contact a health care provider if you:  Notice that your symptoms do not improve with treatment.  Have redness or swelling that gets worse.  Notice that your corn or callus becomes painful.  Have fluid, blood, or pus coming from your corn or callus.  Have new symptoms. Summary  Corns are  small areas of thickened skin that occur on the top, sides, or tip of a toe.  Calluses are areas of thickened skin that can occur anywhere on the body, including the hands, fingers, palms, and soles of the feet. Calluses are usually larger than corns.  Corns and calluses are caused by rubbing (friction) or pressure, such as from shoes that are too tight or do not fit properly.  Treatment may include wearing any padding, protective layers, gloves, or orthotics as told by your health care provider. This information is not intended to replace advice given to you by your health care provider. Make sure you discuss any questions you have with your health care provider. Document Released: 11/25/2003 Document Revised: 01/01/2017 Document Reviewed: 01/01/2017 Elsevier Interactive Patient Education  2019 [employer]  

## 2018-03-10 DIAGNOSIS — R3121 Asymptomatic microscopic hematuria: Secondary | ICD-10-CM | POA: Diagnosis not present

## 2018-03-11 DIAGNOSIS — N3000 Acute cystitis without hematuria: Secondary | ICD-10-CM | POA: Diagnosis not present

## 2018-03-11 DIAGNOSIS — R3121 Asymptomatic microscopic hematuria: Secondary | ICD-10-CM | POA: Diagnosis not present

## 2018-03-19 DIAGNOSIS — M109 Gout, unspecified: Secondary | ICD-10-CM | POA: Diagnosis not present

## 2018-03-19 DIAGNOSIS — Z79899 Other long term (current) drug therapy: Secondary | ICD-10-CM | POA: Diagnosis not present

## 2018-03-19 DIAGNOSIS — M1990 Unspecified osteoarthritis, unspecified site: Secondary | ICD-10-CM | POA: Diagnosis not present

## 2018-03-19 DIAGNOSIS — M1180 Other specified crystal arthropathies, unspecified site: Secondary | ICD-10-CM | POA: Diagnosis not present

## 2018-03-20 DIAGNOSIS — R3121 Asymptomatic microscopic hematuria: Secondary | ICD-10-CM | POA: Diagnosis not present

## 2018-03-20 DIAGNOSIS — R3129 Other microscopic hematuria: Secondary | ICD-10-CM | POA: Diagnosis not present

## 2018-03-23 ENCOUNTER — Other Ambulatory Visit: Payer: Self-pay | Admitting: Internal Medicine

## 2018-03-23 ENCOUNTER — Ambulatory Visit
Admission: RE | Admit: 2018-03-23 | Discharge: 2018-03-23 | Disposition: A | Discharge status: Home / Self Care | Payer: Medicare Other | Source: Ambulatory Visit | Attending: Internal Medicine | Admitting: Internal Medicine

## 2018-03-23 DIAGNOSIS — R600 Localized edema: Secondary | ICD-10-CM | POA: Diagnosis not present

## 2018-03-23 DIAGNOSIS — L03116 Cellulitis of left lower limb: Secondary | ICD-10-CM | POA: Diagnosis not present

## 2018-03-27 ENCOUNTER — Encounter: Payer: Self-pay | Admitting: Podiatry

## 2018-03-27 NOTE — Progress Notes (Signed)
Subjective: Jessica Hawkins presents today with history of neuropathy with cc of painful, mycotic toenails.  Pain is aggravated when wearing enclosed shoe gear and relieved with periodic professional debridement.  Merri Brunette, MD is her PCP.   Current Outpatient Medications:  .  acetaminophen (TYLENOL) 325 MG tablet, Take 650 mg by mouth every 6 (six) hours as needed for mild pain., Disp: , Rfl:  .  allopurinol (ZYLOPRIM) 100 MG tablet, TK 1 T PO  D, Disp: , Rfl: 1 .  amLODipine (NORVASC) 5 MG tablet, Take 5 mg by mouth daily., Disp: , Rfl:  .  aspirin EC 81 MG tablet, Take 81 mg by mouth daily., Disp: , Rfl:  .  benzonatate (TESSALON) 100 MG capsule, TK 1 C PO TID PRN, Disp: , Rfl:  .  CHILDRENS IBUPROFEN 100 PO, Take 2 tablets by mouth as needed., Disp: , Rfl:  .  Emollient (UDDERLY SMOOTH) CREA, Apply 1 application topically 2 (two) times daily as needed (for dry skin related to healing cellulitis)., Disp: , Rfl:  .  ferrous sulfate 325 (65 FE) MG tablet, Take 3 mg by mouth. Take 3 tablets daily., Disp: , Rfl:  .  Fluticasone-Salmeterol (ADVAIR DISKUS) 100-50 MCG/DOSE AEPB, Inhale 1 puff into the lungs 2 (two) times daily., Disp: 14 each, Rfl: 0 .  folic acid (FOLVITE) 1 MG tablet, Take 1 tablet (1 mg total) by mouth daily., Disp: 90 tablet, Rfl: 2 .  furosemide (LASIX) 40 MG tablet, Take one tablet by mouth once daily for edema (Patient taking differently: Take 40 mg by mouth daily. for edema), Disp: 90 tablet, Rfl: 2 .  glucose blood test strip, One Touch Verio Test Strips. Use to test blood sugar once daily. Dx:E11.9, Disp: 100 each, Rfl: 12 .  levothyroxine (SYNTHROID, LEVOTHROID) 25 MCG tablet, take 1 tablet by mouth once daily for THYROID SUPPLEMENT, Disp: 90 tablet, Rfl: 1 .  lidocaine (ASPERCREME W/LIDOCAINE) 4 % cream, Apply 4 times daily to right shoulder (Patient taking differently: Apply 1 application topically 4 (four) times daily as needed. Apply 4 times daily to right  shoulder), Disp: 133 g, Rfl: 5 .  Multiple Vitamin (MULTIVITAMIN WITH MINERALS) TABS tablet, Take 1 tablet by mouth daily. Centrum, Disp: , Rfl:  .  NON FORMULARY, , Disp: , Rfl:  .  omeprazole (PRILOSEC) 20 MG capsule, TK 1 C PO QD, Disp: , Rfl: 2 .  ONETOUCH DELICA LANCETS FINE MISC, Use to test sugar once daily. Dx:E11.9, Disp: 100 each, Rfl: 0 .  sodium chloride (OCEAN) 0.65 % SOLN nasal spray, Place 2 sprays into both nostrils as needed for congestion., Disp: , Rfl:   Allergies  Allergen Reactions  . No Known Allergies     Objective:  Vascular Examination: Capillary refill time immediate x 10 digits Dorsalis pedis and Posterior tibial pulses palpable b/l Digital hair x 10 digits was sparse Skin temperature gradient WNL b/l  Dermatological Examination: Skin with normal turgor, texture and tone b/l  Toenails 1-5 b/l discolored, thick, dystrophic with subungual debris and pain with palpation to nailbeds due to thickness of nails.  Musculoskeletal: Muscle strength 5/5 to all muscle groups b/l  Neurological: Sensation with 10 gram monofilament is intact  Assessment: 1. Painful onychomycosis toenails 1-5 b/l  Plan: 1. Toenails 1-5 b/l were debrided in length and girth without iatrogenic bleeding. 2. Patient to continue soft, supportive shoe gear 3. Patient to report any pedal injuries to medical professional  4. Follow up 3 months. Patient/POA  to call should there be a concern in the interim.

## 2018-04-01 DIAGNOSIS — E039 Hypothyroidism, unspecified: Secondary | ICD-10-CM | POA: Diagnosis not present

## 2018-04-01 DIAGNOSIS — I10 Essential (primary) hypertension: Secondary | ICD-10-CM | POA: Diagnosis not present

## 2018-04-01 DIAGNOSIS — R600 Localized edema: Secondary | ICD-10-CM | POA: Diagnosis not present

## 2018-04-01 DIAGNOSIS — E875 Hyperkalemia: Secondary | ICD-10-CM | POA: Diagnosis not present

## 2018-04-01 DIAGNOSIS — N189 Chronic kidney disease, unspecified: Secondary | ICD-10-CM | POA: Diagnosis not present

## 2018-04-08 DIAGNOSIS — E875 Hyperkalemia: Secondary | ICD-10-CM | POA: Diagnosis not present

## 2018-04-08 DIAGNOSIS — L0390 Cellulitis, unspecified: Secondary | ICD-10-CM | POA: Diagnosis not present

## 2018-04-10 DIAGNOSIS — Z96651 Presence of right artificial knee joint: Secondary | ICD-10-CM | POA: Diagnosis not present

## 2018-04-10 DIAGNOSIS — Z96652 Presence of left artificial knee joint: Secondary | ICD-10-CM | POA: Diagnosis not present

## 2018-04-14 DIAGNOSIS — R3121 Asymptomatic microscopic hematuria: Secondary | ICD-10-CM | POA: Diagnosis not present

## 2018-04-20 ENCOUNTER — Ambulatory Visit (INDEPENDENT_AMBULATORY_CARE_PROVIDER_SITE_OTHER): Payer: Medicare Other | Admitting: Neurology

## 2018-04-20 ENCOUNTER — Encounter: Payer: Self-pay | Admitting: Neurology

## 2018-04-20 VITALS — BP 175/87 | HR 77 | Ht <= 58 in | Wt 134.8 lb

## 2018-04-20 DIAGNOSIS — F0390 Unspecified dementia without behavioral disturbance: Secondary | ICD-10-CM
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
## 2018-04-21 NOTE — Progress Notes (Signed)
I have reviewed and agreed above plan. 

## 2018-04-24 ENCOUNTER — Ambulatory Visit (INDEPENDENT_AMBULATORY_CARE_PROVIDER_SITE_OTHER): Payer: Medicare Other | Admitting: Podiatry

## 2018-04-24 DIAGNOSIS — L84 Corns and callosities: Secondary | ICD-10-CM

## 2018-04-24 DIAGNOSIS — E1142 Type 2 diabetes mellitus with diabetic polyneuropathy: Secondary | ICD-10-CM

## 2018-04-24 DIAGNOSIS — B351 Tinea unguium: Secondary | ICD-10-CM | POA: Diagnosis not present

## 2018-04-24 DIAGNOSIS — M79676 Pain in unspecified toe(s): Secondary | ICD-10-CM | POA: Diagnosis not present

## 2018-04-24 NOTE — Patient Instructions (Signed)

## 2018-05-02 ENCOUNTER — Encounter: Payer: Self-pay | Admitting: Podiatry

## 2018-05-02 NOTE — Progress Notes (Signed)
Subjective: Jessica Hawkins presents with history of diabetic neuropathy and cc of painful, discolored, thick toenails and painful corn distal tip right third toe. Both interfere with activities of daily living. Pain is aggravated when wearing enclosed shoe gear and is relieved with periodic professional debridement.  Merri Brunette, MD PCP.   Current Outpatient Medications:  .  acetaminophen (TYLENOL) 325 MG tablet, Take 650 mg by mouth every 6 (six) hours as needed for mild pain., Disp: , Rfl:  .  allopurinol (ZYLOPRIM) 100 MG tablet, TK 1 T PO  D, Disp: , Rfl: 1 .  amLODipine (NORVASC) 5 MG tablet, Take 5 mg by mouth daily., Disp: , Rfl:  .  aspirin EC 81 MG tablet, Take 81 mg by mouth daily., Disp: , Rfl:  .  benzonatate (TESSALON) 100 MG capsule, TK 1 C PO TID PRN, Disp: , Rfl:  .  CHILDRENS IBUPROFEN 100 PO, Take 2 tablets by mouth as needed., Disp: , Rfl:  .  Cyanocobalamin (VITAMIN B12 PO), Take 7 drops by mouth daily., Disp: , Rfl:  .  Emollient (UDDERLY SMOOTH) CREA, Apply 1 application topically 2 (two) times daily as needed (for dry skin related to healing cellulitis)., Disp: , Rfl:  .  ferrous sulfate 325 (65 FE) MG tablet, Take 3 mg by mouth. Take 3 tablets daily., Disp: , Rfl:  .  Fluticasone-Salmeterol (ADVAIR DISKUS) 100-50 MCG/DOSE AEPB, Inhale 1 puff into the lungs 2 (two) times daily., Disp: 14 each, Rfl: 0 .  folic acid (FOLVITE) 1 MG tablet, Take 1 tablet (1 mg total) by mouth daily., Disp: 90 tablet, Rfl: 2 .  furosemide (LASIX) 40 MG tablet, Take one tablet by mouth once daily for edema (Patient taking differently: Take 40 mg by mouth daily. for edema), Disp: 90 tablet, Rfl: 2 .  glucose blood test strip, One Touch Verio Test Strips. Use to test blood sugar once daily. Dx:E11.9, Disp: 100 each, Rfl: 12 .  levothyroxine (SYNTHROID, LEVOTHROID) 25 MCG tablet, take 1 tablet by mouth once daily for THYROID SUPPLEMENT, Disp: 90 tablet, Rfl: 1 .  lidocaine (ASPERCREME  W/LIDOCAINE) 4 % cream, Apply 4 times daily to right shoulder (Patient taking differently: Apply 1 application topically 4 (four) times daily as needed. Apply 4 times daily to right shoulder), Disp: 133 g, Rfl: 5 .  Multiple Vitamin (MULTIVITAMIN WITH MINERALS) TABS tablet, Take 1 tablet by mouth daily. Centrum, Disp: , Rfl:  .  NON FORMULARY, , Disp: , Rfl:  .  NON FORMULARY, Take by mouth 2 (two) times daily. Neurstem. 2 capsules in the morning  and 2 capsules in the evening, Disp: , Rfl:  .  Omega-3 Fatty Acids (OMEGA-3 FISH OIL PO), Take by mouth daily. Take one tablespoon, Disp: , Rfl:  .  omeprazole (PRILOSEC) 20 MG capsule, TK 1 C PO QD, Disp: , Rfl: 2 .  ONETOUCH DELICA LANCETS FINE MISC, Use to test sugar once daily. Dx:E11.9, Disp: 100 each, Rfl: 0 .  sodium chloride (OCEAN) 0.65 % SOLN nasal spray, Place 2 sprays into both nostrils as needed for congestion., Disp: , Rfl:  .  UNABLE TO FIND, Med Name: Ultra Bender Powder. Half a teaspon in 4 oz of water., Disp: , Rfl:  .  VITAMIN D, ERGOCALCIFEROL, PO, Take 3 drops by mouth daily., Disp: , Rfl:   Allergies  Allergen Reactions  . No Known Allergies     Vascular Examination: Capillary refill time <3 seconds x 10 digits  Dorsalis pedis and  Posterior tibial pulses present b/l  Sparse digital hair x 10 digits  Skin temperature within normal limits bilaterally  Dermatological Examination: Skin with normal turgor, texture and tone b/l.  Toenails 1-5 b/l discolored, thick, dystrophic with subungual debris and pain with palpation to nailbeds due to thickness of nails.  Hyperkeratotic lesion distal tip right third digit.  There is subdermal hemorrhage noted.  There is tenderness to palpation noted.  There is no erythema, no edema, no drainage, no flocculence noted.  Hyperkeratotic lesion noted medial aspect of right bunion.  There is no erythema, no edema, no drainage, no flocculence noted.  Musculoskeletal: Muscle strength 5/5 to  all LE muscle groups  Neurological: Sensation diminished with 10 gram monofilament.   Assessment: 1. Painful onychomycosis toenails 1-5 b/l 2. Callus right first metatarsal 3. Pre-ulcerative corn noted distal tip right third digit 4. NIDDM with Diabetic neuropathy  Plan: 1. Continue diabetic foot care principles.  Literature dispensed on today 2. Toenails 1-5 b/l were debrided in length and girth without iatrogenic bleeding. 3. Hyperkeratotic lesion pared utilizing sterile scalpel blade distal tip right third digit and right first metatarsal without incident.  Dispensed Silipos toe cap for right third digit protection.  She is to apply daily before placing on her socks and shoe gear and remove every evening.  Patient related understanding. 4.  Patient to continue soft, supportive shoe gear 5. Patient to report any pedal injuries to medical professional  6. She is to follow-up in 10 weeks.   7. Patient/POA to call should there be a concern in the interim.

## 2018-07-03 ENCOUNTER — Encounter: Payer: Self-pay | Admitting: Podiatry

## 2018-07-03 ENCOUNTER — Other Ambulatory Visit: Payer: Self-pay

## 2018-07-03 ENCOUNTER — Ambulatory Visit (INDEPENDENT_AMBULATORY_CARE_PROVIDER_SITE_OTHER): Payer: Medicare Other | Admitting: Podiatry

## 2018-07-03 VITALS — Temp 96.6°F

## 2018-07-03 DIAGNOSIS — E1142 Type 2 diabetes mellitus with diabetic polyneuropathy: Secondary | ICD-10-CM | POA: Diagnosis not present

## 2018-07-03 DIAGNOSIS — B351 Tinea unguium: Secondary | ICD-10-CM | POA: Diagnosis not present

## 2018-07-03 DIAGNOSIS — M79676 Pain in unspecified toe(s): Secondary | ICD-10-CM

## 2018-07-03 DIAGNOSIS — L84 Corns and callosities: Secondary | ICD-10-CM | POA: Diagnosis not present

## 2018-07-03 NOTE — Patient Instructions (Signed)
Diabetes Mellitus and Foot Care Foot care is an important part of your health, especially when you have diabetes. Diabetes may cause you to have problems because of poor blood flow (circulation) to your feet and legs, which can cause your skin to:  Become thinner and drier.  Break more easily.  Heal more slowly.  Peel and crack. You may also have nerve damage (neuropathy) in your legs and feet, causing decreased feeling in them. This means that you may not notice minor injuries to your feet that could lead to more serious problems. Noticing and addressing any potential problems early is the best way to prevent future foot problems. How to care for your feet Foot hygiene  Wash your feet daily with warm water and mild soap. Do not use hot water. Then, pat your feet and the areas between your toes until they are completely dry. Do not soak your feet as this can dry your skin.  Trim your toenails straight across. Do not dig under them or around the cuticle. File the edges of your nails with an emery board or nail file.  Apply a moisturizing lotion or petroleum jelly to the skin on your feet and to dry, brittle toenails. Use lotion that does not contain alcohol and is unscented. Do not apply lotion between your toes. Shoes and socks  Wear clean socks or stockings every day. Make sure they are not too tight. Do not wear knee-high stockings since they may decrease blood flow to your legs.  Wear shoes that fit properly and have enough cushioning. Always look in your shoes before you put them on to be sure there are no objects inside.  To break in new shoes, wear them for just a few hours a day. This prevents injuries on your feet. Wounds, scrapes, corns, and calluses  Check your feet daily for blisters, cuts, bruises, sores, and redness. If you cannot see the bottom of your feet, use a mirror or ask someone for help.  Do not cut corns or calluses or try to remove them with medicine.  If you  find a minor scrape, cut, or break in the skin on your feet, keep it and the skin around it clean and dry. You may clean these areas with mild soap and water. Do not clean the area with peroxide, alcohol, or iodine.  If you have a wound, scrape, corn, or callus on your foot, look at it several times a day to make sure it is healing and not infected. Check for: ? Redness, swelling, or pain. ? Fluid or blood. ? Warmth. ? Pus or a bad smell. General instructions  Do not cross your legs. This may decrease blood flow to your feet.  Do not use heating pads or hot water bottles on your feet. They may burn your skin. If you have lost feeling in your feet or legs, you may not know this is happening until it is too late.  Protect your feet from hot and cold by wearing shoes, such as at the beach or on hot pavement.  Schedule a complete foot exam at least once a year (annually) or more often if you have foot problems. If you have foot problems, report any cuts, sores, or bruises to your health care provider immediately. Contact a health care provider if:  You have a medical condition that increases your risk of infection and you have any cuts, sores, or bruises on your feet.  You have an injury that is not   healing.  You have redness on your legs or feet.  You feel burning or tingling in your legs or feet.  You have pain or cramps in your legs and feet.  Your legs or feet are numb.  Your feet always feel cold.  You have pain around a toenail. Get help right away if:  You have a wound, scrape, corn, or callus on your foot and: ? You have pain, swelling, or redness that gets worse. ? You have fluid or blood coming from the wound, scrape, corn, or callus. ? Your wound, scrape, corn, or callus feels warm to the touch. ? You have pus or a bad smell coming from the wound, scrape, corn, or callus. ? You have a fever. ? You have a red line going up your leg. Summary  Check your feet every day  for cuts, sores, red spots, swelling, and blisters.  Moisturize feet and legs daily.  Wear shoes that fit properly and have enough cushioning.  If you have foot problems, report any cuts, sores, or bruises to your health care provider immediately.  Schedule a complete foot exam at least once a year (annually) or more often if you have foot problems. This information is not intended to replace advice given to you by your health care provider. Make sure you discuss any questions you have with your health care provider. Document Released: 02/16/2000 Document Revised: 04/02/2017 Document Reviewed: 03/22/2016 Elsevier Interactive Patient Education  2019 [employer]  Onychomycosis/Fungal Toenails  WHAT IS IT? An infection that lies within the keratin of your nail plate that is caused by a fungus.  WHY ME? Fungal infections affect all ages, sexes, races, and creeds.  There may be many factors that predispose you to a fungal infection such as age, coexisting medical conditions such as diabetes, or an autoimmune disease; stress, medications, fatigue, genetics, etc.  Bottom line: fungus thrives in a warm, moist environment and your shoes offer such a location.  IS IT CONTAGIOUS? Theoretically, yes.  You do not want to share shoes, nail clippers or files with someone who has fungal toenails.  Walking around barefoot in the same room or sleeping in the same bed is unlikely to transfer the organism.  It is important to realize, however, that fungus can spread easily from one nail to the next on the same foot.  HOW DO WE TREAT THIS?  There are several ways to treat this condition.  Treatment may depend on many factors such as age, medications, pregnancy, liver and kidney conditions, etc.  It is best to ask your doctor which options are available to you.  1. No treatment.   Unlike many other medical concerns, you can live with this condition.  However for many people this can be a painful condition and  may lead to ingrown toenails or a bacterial infection.  It is recommended that you keep the nails cut short to help reduce the amount of fungal nail. 2. Topical treatment.  These range from herbal remedies to prescription strength nail lacquers.  About 40-50% effective, topicals require twice daily application for approximately 9 to 12 months or until an entirely new nail has grown out.  The most effective topicals are medical grade medications available through physicians offices. 3. Oral antifungal medications.  With an 80-90% cure rate, the most common oral medication requires 3 to 4 months of therapy and stays in your system for a year as the new nail grows out.  Oral antifungal medications do require   blood work to make sure it is a safe drug for you.  A liver function panel will be performed prior to starting the medication and after the first month of treatment.  It is important to have the blood work performed to avoid any harmful side effects.  In general, this medication safe but blood work is required. 4. Laser Therapy.  This treatment is performed by applying a specialized laser to the affected nail plate.  This therapy is noninvasive, fast, and non-painful.  It is not covered by insurance and is therefore, out of pocket.  The results have been very good with a 80-95% cure rate.  [facility] is the only practice in the area to offer this therapy. 5. Permanent Nail Avulsion.  Removing the entire nail so that a new nail will not grow back. 

## 2018-07-09 ENCOUNTER — Encounter: Payer: Self-pay | Admitting: Podiatry

## 2018-07-09 NOTE — Progress Notes (Signed)
Subjective: Jessica Hawkins  presents for preventative diabetic foot care. She has h/o diabetic neuropathy with corn and callus which poses a risk due to diabetes.   Deland Pretty, MD is her PCP and last visit was January, 2020.   Current Outpatient Medications:  .  acetaminophen (TYLENOL) 325 MG tablet, Take 650 mg by mouth every 6 (six) hours as needed for mild pain., Disp: , Rfl:  .  allopurinol (ZYLOPRIM) 100 MG tablet, TK 1 T PO  D, Disp: , Rfl: 1 .  amLODipine (NORVASC) 5 MG tablet, Take 5 mg by mouth daily., Disp: , Rfl:  .  aspirin EC 81 MG tablet, Take 81 mg by mouth daily., Disp: , Rfl:  .  benzonatate (TESSALON) 100 MG capsule, TK 1 C PO TID PRN, Disp: , Rfl:  .  CHILDRENS IBUPROFEN 100 PO, Take 2 tablets by mouth as needed., Disp: , Rfl:  .  Cyanocobalamin (VITAMIN B12 PO), Take 7 drops by mouth daily., Disp: , Rfl:  .  Emollient (UDDERLY SMOOTH) CREA, Apply 1 application topically 2 (two) times daily as needed (for dry skin related to healing cellulitis)., Disp: , Rfl:  .  ferrous sulfate 325 (65 FE) MG tablet, Take 3 mg by mouth. Take 3 tablets daily., Disp: , Rfl:  .  Fluticasone-Salmeterol (ADVAIR DISKUS) 100-50 MCG/DOSE AEPB, Inhale 1 puff into the lungs 2 (two) times daily., Disp: 14 each, Rfl: 0 .  folic acid (FOLVITE) 1 MG tablet, Take 1 tablet (1 mg total) by mouth daily., Disp: 90 tablet, Rfl: 2 .  furosemide (LASIX) 40 MG tablet, Take one tablet by mouth once daily for edema (Patient taking differently: Take 40 mg by mouth daily. for edema), Disp: 90 tablet, Rfl: 2 .  glucose blood test strip, One Touch Verio Test Strips. Use to test blood sugar once daily. Dx:E11.9, Disp: 100 each, Rfl: 12 .  levothyroxine (SYNTHROID, LEVOTHROID) 25 MCG tablet, take 1 tablet by mouth once daily for THYROID SUPPLEMENT, Disp: 90 tablet, Rfl: 1 .  lidocaine (ASPERCREME W/LIDOCAINE) 4 % cream, Apply 4 times daily to right shoulder (Patient taking differently: Apply 1 application topically 4  (four) times daily as needed. Apply 4 times daily to right shoulder), Disp: 133 g, Rfl: 5 .  Multiple Vitamin (MULTIVITAMIN WITH MINERALS) TABS tablet, Take 1 tablet by mouth daily. Centrum, Disp: , Rfl:  .  NON FORMULARY, , Disp: , Rfl:  .  NON FORMULARY, Take by mouth 2 (two) times daily. Neurstem. 2 capsules in the morning  and 2 capsules in the evening, Disp: , Rfl:  .  Omega-3 Fatty Acids (OMEGA-3 FISH OIL PO), Take by mouth daily. Take one tablespoon, Disp: , Rfl:  .  omeprazole (PRILOSEC) 20 MG capsule, TK 1 C PO QD, Disp: , Rfl: 2 .  ONETOUCH DELICA LANCETS FINE MISC, Use to test sugar once daily. Dx:E11.9, Disp: 100 each, Rfl: 0 .  sodium chloride (OCEAN) 0.65 % SOLN nasal spray, Place 2 sprays into both nostrils as needed for congestion., Disp: , Rfl:  .  UNABLE TO FIND, Med Name: Ultra Bender Powder. Half a teaspon in 4 oz of water., Disp: , Rfl:  .  VITAMIN D, ERGOCALCIFEROL, PO, Take 3 drops by mouth daily., Disp: , Rfl:   Allergies  Allergen Reactions  . No Known Allergies    Objective: Vitals:   07/03/18 1115  Temp: (!) 96.6 F (35.9 C)   Vascular Examination: Capillary refill time <3 seconds x 10 digits.  Dorsalis pedis pulses palpable b/l.  Posterior tibial pulses palpable b/l.  Dgital hair sparse x 10 digits.  Skin temperature gradient WNL b/l.  Dermatological Examination: Skin with normal turgor, texture and tone b/l.  Toenails 1-5 b/l discolored, thick, dystrophic with subungual debris and pain with palpation to nailbeds due to thickness of nails.  Hyperkeratotic lesion distal tip right 3rd digit medial 1st met head. No erythema, no edema, no drainage, no flocculence noted.   Musculoskeletal: Muscle strength 5/5 to all LE muscle groups  Neurological: Sensation diminished with 10 gram monofilament.  Assessment: 1. Painful onychomycosis toenails 1-5 b/l 2. Callus 1st metatarsal head right foot 3. Corn distal tip right 3rd digit 4. NIDDM with Diabetic  neuropathy  Plan: 1. Continue diabetic foot care principles. Literature dispensed on today. 2. Toenails 1-5 b/l were debrided in length and girth without iatrogenic bleeding. 3. Calluses pared 1st metatarsal head right utilizing sterile scalpel blade without incident. Corn(s) pared distal tip right 3rd digit utilizing sterile scalpel blade without incident.  4. Patient to continue soft, supportive shoe gear 5. Patient to report any pedal injuries to medical professional  6. Follow up 9 weeks. 7. Patient/POA to call should there be a concern in the interim.

## 2018-07-29 IMAGING — DX DG CHEST 2V
2 series · 2 of 2 positions shown · non-contrast
Comparison: Radiographs 05/19/2015

CLINICAL DATA: Epigastric and mid chest pain. Onset this evening
after eating a fish sandwhich.

EXAM:
CHEST  2 VIEW

[chest lat]
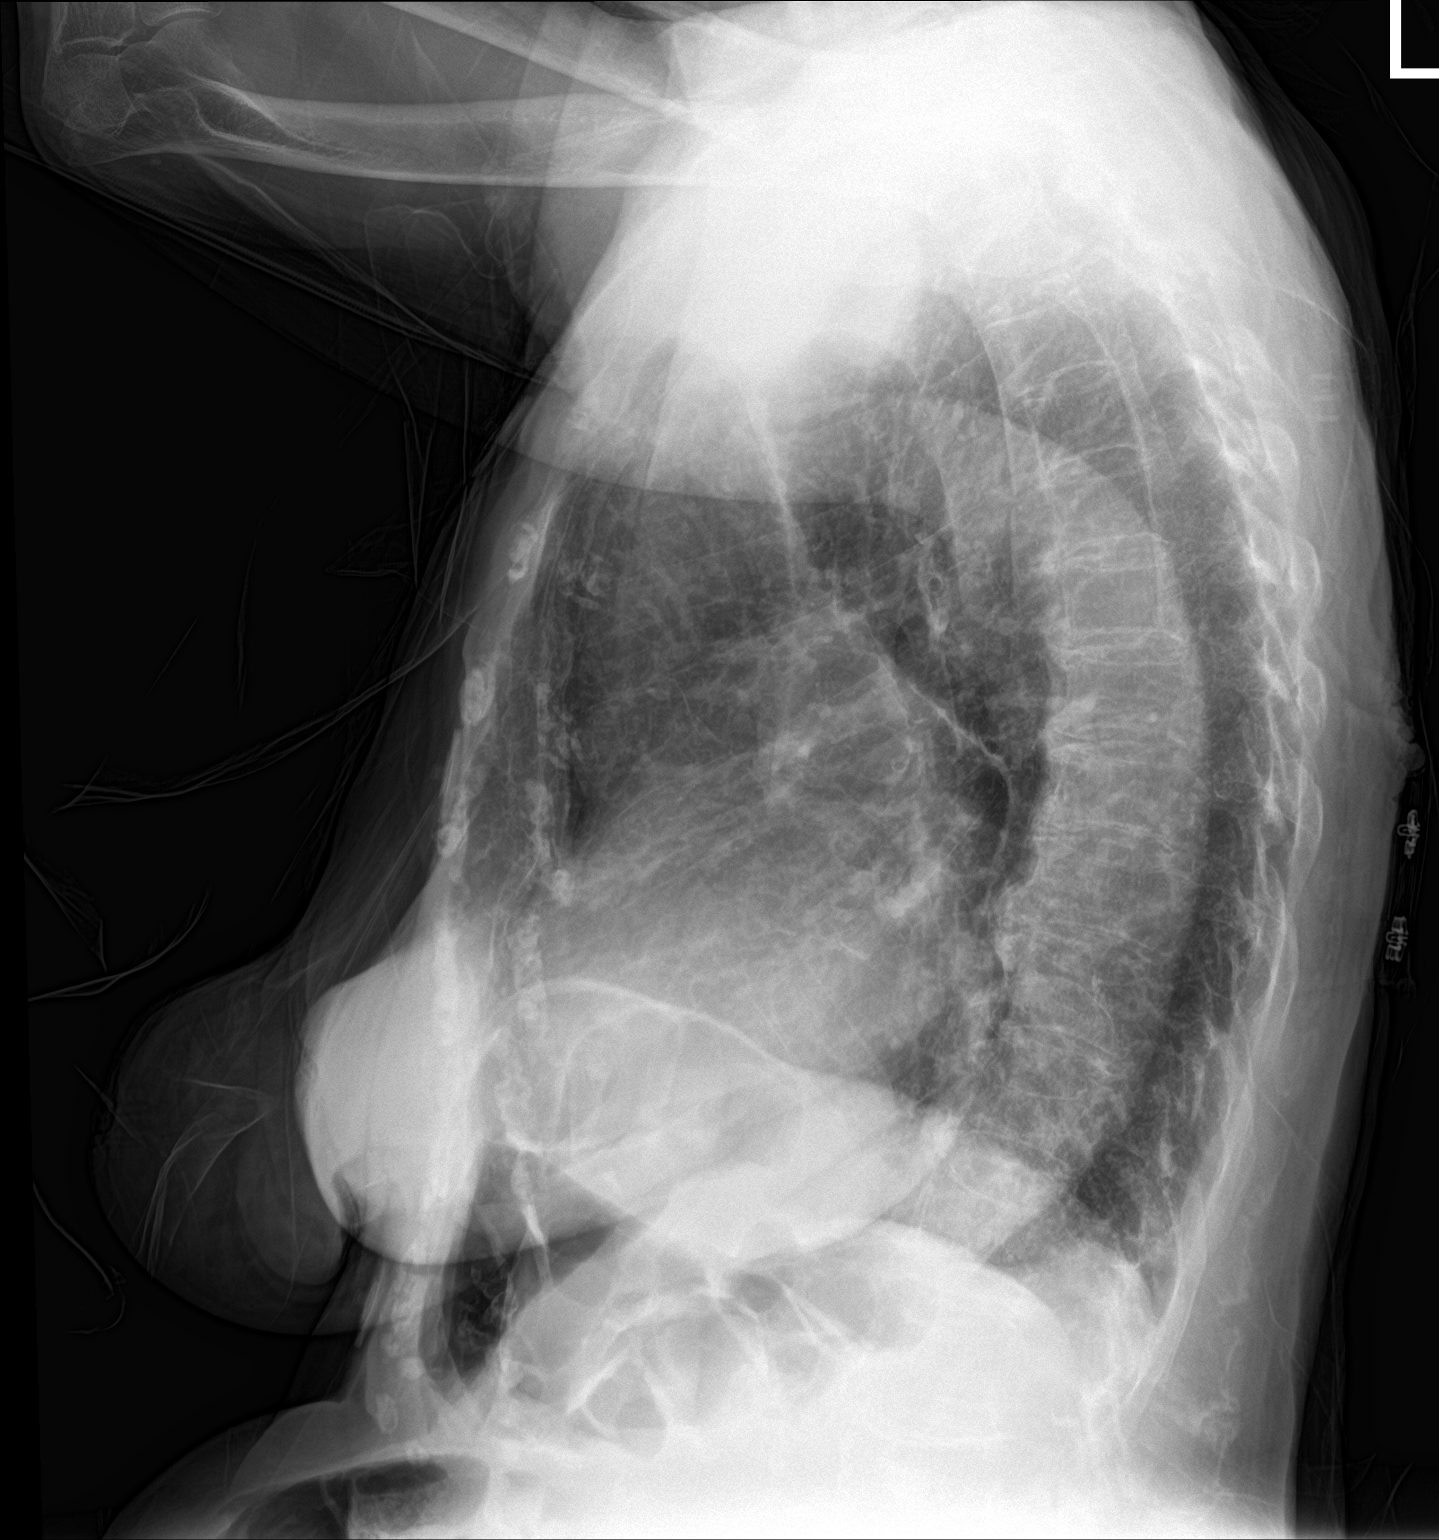

[chest ap]
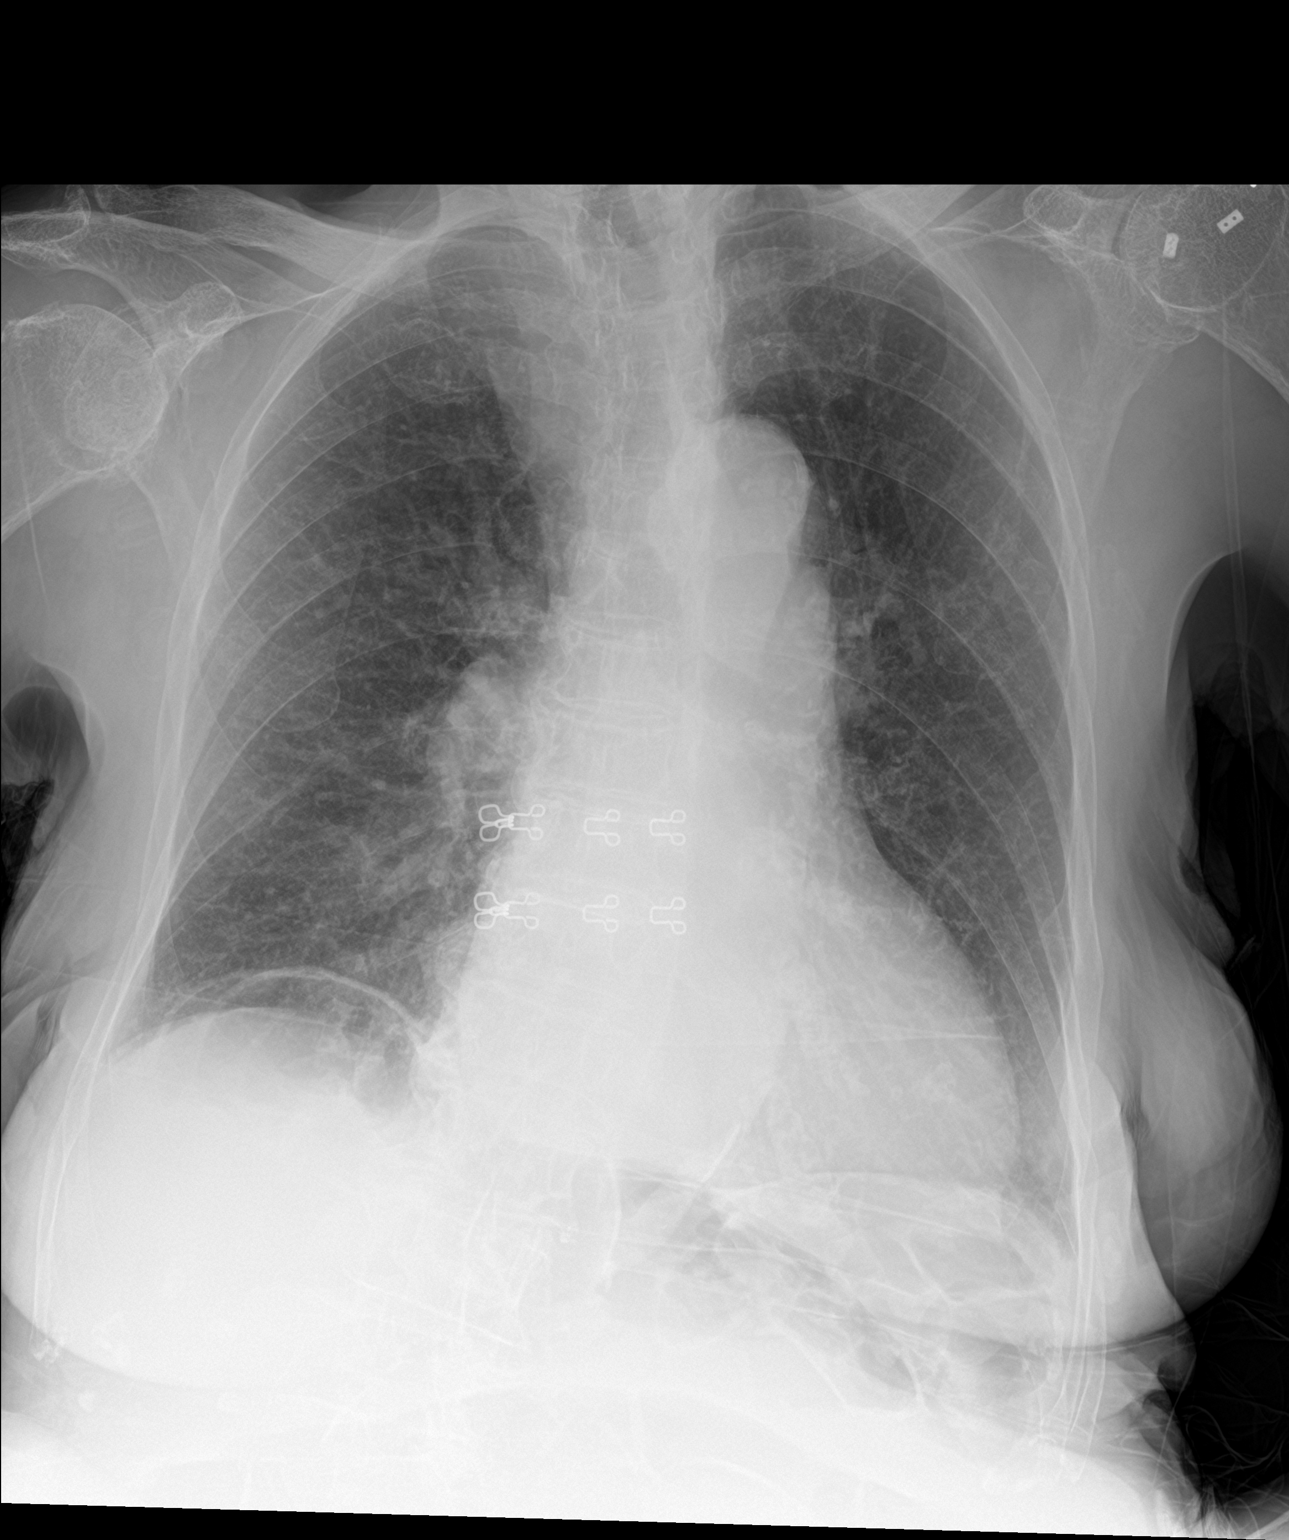

[2 of 2 positions shown; findings below may reference images not displayed]

FINDINGS: Mild cardiomegaly with tortuosity of the thoracic aorta. No
pulmonary edema. No focal airspace disease, pleural effusion or
pneumothorax. The bones are under mineralized. There is scoliotic
curvature in the thoracic spine. Air-filled colon under the right
hemidiaphragm.
IMPRESSION: Mild cardiomegaly and tortuosity of the thoracic aorta. No
localizing pulmonary process.

## 2018-08-17 DIAGNOSIS — K219 Gastro-esophageal reflux disease without esophagitis: Secondary | ICD-10-CM | POA: Diagnosis not present

## 2018-08-17 DIAGNOSIS — E1151 Type 2 diabetes mellitus with diabetic peripheral angiopathy without gangrene: Secondary | ICD-10-CM | POA: Diagnosis not present

## 2018-08-17 DIAGNOSIS — E039 Hypothyroidism, unspecified: Secondary | ICD-10-CM | POA: Diagnosis not present

## 2018-08-17 DIAGNOSIS — I10 Essential (primary) hypertension: Secondary | ICD-10-CM | POA: Diagnosis not present

## 2018-08-17 DIAGNOSIS — Z7982 Long term (current) use of aspirin: Secondary | ICD-10-CM | POA: Diagnosis not present

## 2018-09-11 ENCOUNTER — Other Ambulatory Visit: Payer: Self-pay

## 2018-09-11 ENCOUNTER — Ambulatory Visit (INDEPENDENT_AMBULATORY_CARE_PROVIDER_SITE_OTHER): Payer: Medicare Other | Admitting: Podiatry

## 2018-09-11 ENCOUNTER — Other Ambulatory Visit: Payer: Self-pay | Admitting: Internal Medicine

## 2018-09-11 ENCOUNTER — Encounter: Payer: Self-pay | Admitting: Podiatry

## 2018-09-11 VITALS — Temp 98.0°F

## 2018-09-11 DIAGNOSIS — L84 Corns and callosities: Secondary | ICD-10-CM

## 2018-09-11 DIAGNOSIS — M79676 Pain in unspecified toe(s): Secondary | ICD-10-CM

## 2018-09-11 DIAGNOSIS — B351 Tinea unguium: Secondary | ICD-10-CM

## 2018-09-11 DIAGNOSIS — E1142 Type 2 diabetes mellitus with diabetic polyneuropathy: Secondary | ICD-10-CM | POA: Diagnosis not present

## 2018-09-11 DIAGNOSIS — Z1231 Encounter for screening mammogram for malignant neoplasm of breast: Secondary | ICD-10-CM

## 2018-09-11 NOTE — Patient Instructions (Signed)
Corns and Calluses Corns are small areas of thickened skin that occur on the top, sides, or tip of a toe. They contain a cone-shaped core with a point that can press on a nerve below. This causes pain.  Calluses are areas of thickened skin that can occur anywhere on the body, including the hands, fingers, palms, soles of the feet, and heels. Calluses are usually larger than corns. What are the causes? Corns and calluses are caused by rubbing (friction) or pressure, such as from shoes that are too tight or do not fit properly. What increases the risk? Corns are more likely to develop in people who have misshapen toes (toe deformities), such as hammer toes. Calluses can occur with friction to any area of the skin. They are more likely to develop in people who:  Work with their hands.  Wear shoes that fit poorly, are too tight, or are high-heeled.  Have toe deformities. What are the signs or symptoms? Symptoms of a corn or callus include:  A hard growth on the skin.  Pain or tenderness under the skin.  Redness and swelling.  Increased discomfort while wearing tight-fitting shoes, if your feet are affected. If a corn or callus becomes infected, symptoms may include:  Redness and swelling that gets worse.  Pain.  Fluid, blood, or pus draining from the corn or callus. How is this diagnosed? Corns and calluses may be diagnosed based on your symptoms, your medical history, and a physical exam. How is this treated? Treatment for corns and calluses may include:  Removing the cause of the friction or pressure. This may involve: ? Changing your shoes. ? Wearing shoe inserts (orthotics) or other protective layers in your shoes, such as a corn pad. ? Wearing gloves.  Applying medicine to the skin (topical medicine) to help soften skin in the hardened, thickened areas.  Removing layers of dead skin with a file to reduce the size of the corn or callus.  Removing the corn or callus with a  scalpel or laser.  Taking antibiotic medicines, if your corn or callus is infected.  Having surgery, if a toe deformity is the cause. Follow these instructions at home:   Take over-the-counter and prescription medicines only as told by your health care provider.  If you were prescribed an antibiotic, take it as told by your health care provider. Do not stop taking it even if your condition starts to improve.  Wear shoes that fit well. Avoid wearing high-heeled shoes and shoes that are too tight or too loose.  Wear any padding, protective layers, gloves, or orthotics as told by your health care provider.  Soak your hands or feet and then use a file or pumice stone to soften your corn or callus. Do this as told by your health care provider.  Check your corn or callus every day for symptoms of infection. Contact a health care provider if you:  Notice that your symptoms do not improve with treatment.  Have redness or swelling that gets worse.  Notice that your corn or callus becomes painful.  Have fluid, blood, or pus coming from your corn or callus.  Have new symptoms. Summary  Corns are small areas of thickened skin that occur on the top, sides, or tip of a toe.  Calluses are areas of thickened skin that can occur anywhere on the body, including the hands, fingers, palms, and soles of the feet. Calluses are usually larger than corns.  Corns and calluses are caused by   rubbing (friction) or pressure, such as from shoes that are too tight or do not fit properly.  Treatment may include wearing any padding, protective layers, gloves, or orthotics as told by your health care provider. This information is not intended to replace advice given to you by your health care provider. Make sure you discuss any questions you have with your health care provider. Document Released: 11/25/2003 Document Revised: 06/10/2018 Document Reviewed: 01/01/2017 Elsevier Patient Education  2020 [employer]   Onychomycosis/Fungal Toenails  WHAT IS IT? An infection that lies within the keratin of your nail plate that is caused by a fungus.  WHY ME? Fungal infections affect all ages, sexes, races, and creeds.  There may be many factors that predispose you to a fungal infection such as age, coexisting medical conditions such as diabetes, or an autoimmune disease; stress, medications, fatigue, genetics, etc.  Bottom line: fungus thrives in a warm, moist environment and your shoes offer such a location.  IS IT CONTAGIOUS? Theoretically, yes.  You do not want to share shoes, nail clippers or files with someone who has fungal toenails.  Walking around barefoot in the same room or sleeping in the same bed is unlikely to transfer the organism.  It is important to realize, however, that fungus can spread easily from one nail to the next on the same foot.  HOW DO WE TREAT THIS?  There are several ways to treat this condition.  Treatment may depend on many factors such as age, medications, pregnancy, liver and kidney conditions, etc.  It is best to ask your doctor which options are available to you.  1. No treatment.   Unlike many other medical concerns, you can live with this condition.  However for many people this can be a painful condition and may lead to ingrown toenails or a bacterial infection.  It is recommended that you keep the nails cut short to help reduce the amount of fungal nail. 2. Topical treatment.  These range from herbal remedies to prescription strength nail lacquers.  About 40-50% effective, topicals require twice daily application for approximately 9 to 12 months or until an entirely new nail has grown out.  The most effective topicals are medical grade medications available through physicians offices. 3. Oral antifungal medications.  With an 80-90% cure rate, the most common oral medication requires 3 to 4 months of therapy and stays in your system for a year as the new nail grows out.   Oral antifungal medications do require blood work to make sure it is a safe drug for you.  A liver function panel will be performed prior to starting the medication and after the first month of treatment.  It is important to have the blood work performed to avoid any harmful side effects.  In general, this medication safe but blood work is required. 4. Laser Therapy.  This treatment is performed by applying a specialized laser to the affected nail plate.  This therapy is noninvasive, fast, and non-painful.  It is not covered by insurance and is therefore, out of pocket.  The results have been very good with a 80-95% cure rate.  [facility] is the only practice in the area to offer this therapy. 5. Permanent Nail Avulsion.  Removing the entire nail so that a new nail will not grow back. 

## 2018-09-13 NOTE — Progress Notes (Signed)
Subjective: Jessica Hawkins presents with diabetes, diabetic neuropathy and cc of painful, discolored, thick toenails and painful callus/corn which interfere with activities of daily living. Pain is aggravated when wearing enclosed shoe gear. Pain is relieved with periodic professional debridement.  She is accompanied by her caretaker on today's visit.  Deland Pretty, MD is her PCP.     Current Outpatient Medications:  .  acetaminophen (TYLENOL) 325 MG tablet, Take 650 mg by mouth every 6 (six) hours as needed for mild pain., Disp: , Rfl:  .  allopurinol (ZYLOPRIM) 100 MG tablet, TK 1 T PO  D, Disp: , Rfl: 1 .  amLODipine (NORVASC) 5 MG tablet, Take 5 mg by mouth daily., Disp: , Rfl:  .  aspirin EC 81 MG tablet, Take 81 mg by mouth daily., Disp: , Rfl:  .  benzonatate (TESSALON) 100 MG capsule, TK 1 C PO TID PRN, Disp: , Rfl:  .  CHILDRENS IBUPROFEN 100 PO, Take 2 tablets by mouth as needed., Disp: , Rfl:  .  Cyanocobalamin (VITAMIN B12 PO), Take 7 drops by mouth daily., Disp: , Rfl:  .  Emollient (UDDERLY SMOOTH) CREA, Apply 1 application topically 2 (two) times daily as needed (for dry skin related to healing cellulitis)., Disp: , Rfl:  .  ferrous sulfate 325 (65 FE) MG tablet, Take 3 mg by mouth. Take 3 tablets daily., Disp: , Rfl:  .  Fluticasone-Salmeterol (ADVAIR DISKUS) 100-50 MCG/DOSE AEPB, Inhale 1 puff into the lungs 2 (two) times daily., Disp: 14 each, Rfl: 0 .  folic acid (FOLVITE) 1 MG tablet, Take 1 tablet (1 mg total) by mouth daily., Disp: 90 tablet, Rfl: 2 .  furosemide (LASIX) 40 MG tablet, Take one tablet by mouth once daily for edema (Patient taking differently: Take 40 mg by mouth daily. for edema), Disp: 90 tablet, Rfl: 2 .  glucose blood test strip, One Touch Verio Test Strips. Use to test blood sugar once daily. Dx:E11.9, Disp: 100 each, Rfl: 12 .  levothyroxine (SYNTHROID, LEVOTHROID) 25 MCG tablet, take 1 tablet by mouth once daily for THYROID SUPPLEMENT, Disp: 90  tablet, Rfl: 1 .  lidocaine (ASPERCREME W/LIDOCAINE) 4 % cream, Apply 4 times daily to right shoulder (Patient taking differently: Apply 1 application topically 4 (four) times daily as needed. Apply 4 times daily to right shoulder), Disp: 133 g, Rfl: 5 .  Multiple Vitamin (MULTIVITAMIN WITH MINERALS) TABS tablet, Take 1 tablet by mouth daily. Centrum, Disp: , Rfl:  .  NON FORMULARY, , Disp: , Rfl:  .  NON FORMULARY, Take by mouth 2 (two) times daily. Neurstem. 2 capsules in the morning  and 2 capsules in the evening, Disp: , Rfl:  .  Omega-3 Fatty Acids (OMEGA-3 FISH OIL PO), Take by mouth daily. Take one tablespoon, Disp: , Rfl:  .  omeprazole (PRILOSEC) 20 MG capsule, TK 1 C PO QD, Disp: , Rfl: 2 .  ONETOUCH DELICA LANCETS FINE MISC, Use to test sugar once daily. Dx:E11.9, Disp: 100 each, Rfl: 0 .  sodium chloride (OCEAN) 0.65 % SOLN nasal spray, Place 2 sprays into both nostrils as needed for congestion., Disp: , Rfl:  .  UNABLE TO FIND, Med Name: Ultra Bender Powder. Half a teaspon in 4 oz of water., Disp: , Rfl:  .  VITAMIN D, ERGOCALCIFEROL, PO, Take 3 drops by mouth daily., Disp: , Rfl:   Allergies  Allergen Reactions  . No Known Allergies     Objective: Vitals:   09/11/18 1118  Temp: 98 F (36.7 C)    Vascular Examination: Capillary refill time <3 seconds x 10 digits.  Dorsalis pedis pulses palpable b/l.  Posterior tibial pulses palpable b/l.  Digital hair sparse x 10 digits.  Skin temperature gradient WNL b/l.  Dermatological Examination: Skin with normal turgor, texture and tone b/l.  Toenails 1-5 b/l discolored, thick, dystrophic with subungual debris and pain with palpation to nailbeds due to thickness of nails.  Hyperkeratotic lesion(s) distal tip right 3rd digit, right 1st met head. No erythema, no edema, no drainage, no flocculence noted.   Musculoskeletal: Muscle strength 5/5 to all LE muscle groups.  No pain, crepitus or joint limitation with  passive/active ROM.  Neurological: Sensation diminished with 10 gram monofilament.  Assessment: 1. Painful onychomycosis toenails 1-5 b/l 2. Callus 1st met head right foot 3. Corn distal tip right 3rd digit 4. NIDDM with Diabetic neuropathy  Plan: 1. Continue diabetic foot care principles. Literature dispensed on today. 2. Toenails 1-5 b/l were debrided in length and girth without iatrogenic bleeding. 3. Calluses pared right 1st met head utilizing sterile scalpel blade without incident. Corn(s) pared right 3rd digit utilizing sterile scalpel blade without incident.  4. Patient to continue soft, supportive shoe gear 5. Patient to report any pedal injuries to medical professional  6. Follow up 3 months.  7. Patient/POA to call should there be a concern in the interim.

## 2018-09-17 DIAGNOSIS — Z79899 Other long term (current) drug therapy: Secondary | ICD-10-CM | POA: Diagnosis not present

## 2018-09-17 DIAGNOSIS — M1990 Unspecified osteoarthritis, unspecified site: Secondary | ICD-10-CM | POA: Diagnosis not present

## 2018-09-17 DIAGNOSIS — M25431 Effusion, right wrist: Secondary | ICD-10-CM | POA: Diagnosis not present

## 2018-09-17 DIAGNOSIS — M1180 Other specified crystal arthropathies, unspecified site: Secondary | ICD-10-CM | POA: Diagnosis not present

## 2018-09-17 DIAGNOSIS — M25539 Pain in unspecified wrist: Secondary | ICD-10-CM | POA: Diagnosis not present

## 2018-09-17 DIAGNOSIS — M109 Gout, unspecified: Secondary | ICD-10-CM | POA: Diagnosis not present

## 2018-09-21 DIAGNOSIS — R32 Unspecified urinary incontinence: Secondary | ICD-10-CM | POA: Diagnosis not present

## 2018-09-21 DIAGNOSIS — R300 Dysuria: Secondary | ICD-10-CM | POA: Diagnosis not present

## 2018-10-07 DIAGNOSIS — R350 Frequency of micturition: Secondary | ICD-10-CM | POA: Diagnosis not present

## 2018-10-07 DIAGNOSIS — I10 Essential (primary) hypertension: Secondary | ICD-10-CM | POA: Diagnosis not present

## 2018-10-20 DIAGNOSIS — N3946 Mixed incontinence: Secondary | ICD-10-CM | POA: Diagnosis not present

## 2018-10-20 DIAGNOSIS — R3121 Asymptomatic microscopic hematuria: Secondary | ICD-10-CM | POA: Diagnosis not present

## 2018-10-26 ENCOUNTER — Other Ambulatory Visit: Payer: Self-pay

## 2018-10-26 ENCOUNTER — Ambulatory Visit
Admission: RE | Admit: 2018-10-26 | Discharge: 2018-10-26 | Disposition: A | Discharge status: Home / Self Care | Payer: Medicare Other | Source: Ambulatory Visit | Attending: Internal Medicine | Admitting: Internal Medicine

## 2018-10-26 DIAGNOSIS — Z1231 Encounter for screening mammogram for malignant neoplasm of breast: Secondary | ICD-10-CM | POA: Diagnosis not present

## 2018-11-23 DIAGNOSIS — H10022 Other mucopurulent conjunctivitis, left eye: Secondary | ICD-10-CM | POA: Diagnosis not present

## 2018-12-14 ENCOUNTER — Other Ambulatory Visit: Payer: Self-pay

## 2018-12-14 ENCOUNTER — Encounter: Payer: Self-pay | Admitting: Podiatry

## 2018-12-14 ENCOUNTER — Ambulatory Visit (INDEPENDENT_AMBULATORY_CARE_PROVIDER_SITE_OTHER): Payer: Medicare Other | Admitting: Podiatry

## 2018-12-14 DIAGNOSIS — B351 Tinea unguium: Secondary | ICD-10-CM

## 2018-12-14 DIAGNOSIS — M79676 Pain in unspecified toe(s): Secondary | ICD-10-CM

## 2018-12-14 DIAGNOSIS — L84 Corns and callosities: Secondary | ICD-10-CM | POA: Diagnosis not present

## 2018-12-14 DIAGNOSIS — E1142 Type 2 diabetes mellitus with diabetic polyneuropathy: Secondary | ICD-10-CM | POA: Diagnosis not present

## 2018-12-14 NOTE — Patient Instructions (Signed)

## 2018-12-17 NOTE — Progress Notes (Signed)
Subjective: Jessica Hawkins presents to clinic for preventative diabetic foot care. She is accompanied by her caregiver. She is seen for painful, mycotic toenails, corn and callus of right foot. This pain limits her daily activities. Pain symptoms resolve with periodic professional debridement.  Deland Pretty, MD is her PCP.   Current Outpatient Medications on File Prior to Visit  Medication Sig Dispense Refill  . acetaminophen (TYLENOL) 325 MG tablet Take 650 mg by mouth every 6 (six) hours as needed for mild pain.    Marland Kitchen allopurinol (ZYLOPRIM) 100 MG tablet TK 1 T PO  D  1  . amLODipine (NORVASC) 5 MG tablet Take 5 mg by mouth daily.    Marland Kitchen amoxicillin (AMOXIL) 500 MG tablet TK 1 T PO TID UNTIL GONE    . aspirin EC 81 MG tablet Take 81 mg by mouth daily.    . benzonatate (TESSALON) 100 MG capsule TK 1 C PO TID PRN    . CHILDRENS IBUPROFEN 100 PO Take 2 tablets by mouth as needed.    Marland Kitchen CILOXAN 0.3 % ophthalmic ointment APP 1 APPLICATION TO THE LOWER EYELID OF THE AFFECTED EYE BID    . Cyanocobalamin (VITAMIN B12 PO) Take 7 drops by mouth daily.    . Emollient (UDDERLY SMOOTH) CREA Apply 1 application topically 2 (two) times daily as needed (for dry skin related to healing cellulitis).    . ferrous sulfate 325 (65 FE) MG tablet Take 3 mg by mouth. Take 3 tablets daily.    . Fluticasone-Salmeterol (ADVAIR DISKUS) 100-50 MCG/DOSE AEPB Inhale 1 puff into the lungs 2 (two) times daily. 14 each 0  . folic acid (FOLVITE) 1 MG tablet Take 1 tablet (1 mg total) by mouth daily. 90 tablet 2  . furosemide (LASIX) 40 MG tablet Take one tablet by mouth once daily for edema (Patient taking differently: Take 40 mg by mouth daily. for edema) 90 tablet 2  . glucose blood test strip One Touch Verio Test Strips. Use to test blood sugar once daily. Dx:E11.9 100 each 12  . levothyroxine (SYNTHROID, LEVOTHROID) 25 MCG tablet take 1 tablet by mouth once daily for THYROID SUPPLEMENT 90 tablet 1  . lidocaine (ASPERCREME  W/LIDOCAINE) 4 % cream Apply 4 times daily to right shoulder (Patient taking differently: Apply 1 application topically 4 (four) times daily as needed. Apply 4 times daily to right shoulder) 133 g 5  . Multiple Vitamin (MULTIVITAMIN WITH MINERALS) TABS tablet Take 1 tablet by mouth daily. Centrum    . NON FORMULARY     . NON FORMULARY Take by mouth 2 (two) times daily. Neurstem. 2 capsules in the morning  and 2 capsules in the evening    . Omega-3 Fatty Acids (OMEGA-3 FISH OIL PO) Take by mouth daily. Take one tablespoon    . omeprazole (PRILOSEC) 20 MG capsule TK 1 C PO QD  2  . ONETOUCH DELICA LANCETS FINE MISC Use to test sugar once daily. Dx:E11.9 100 each 0  . sodium chloride (OCEAN) 0.65 % SOLN nasal spray Place 2 sprays into both nostrils as needed for congestion.    Marland Kitchen UNABLE TO FIND Med Name: Ultra Bender Powder. Half a teaspon in 4 oz of water.    Marland Kitchen VITAMIN D, ERGOCALCIFEROL, PO Take 3 drops by mouth daily.     No current facility-administered medications on file prior to visit.      Allergies  Allergen Reactions  . No Known Allergies     Objective: There  were no vitals filed for this visit.  Physical Examination:  Vascular  Examination: Capillary refill time <3 seconds x 10 digits.  Palpable DP/PT pulses b/l.  Digital hair sparse b/l.  No edema noted b/l.  Skin temperature gradient WNL b/l.  Dermatological Examination: Skin with normal turgor, texture and tone b/l.  No open wounds b/l.  No interdigital macerations noted b/l.  Elongated, thick, discolored brittle toenails with subungual debris and pain on dorsal palpation of nailbeds 1-5 b/l.  Hyperkeratotic lesion submet head 1 right and distal tip right 3rd digit with tenderness to palpation. No edema, no erythema, no drainage, no flocculence.   Musculoskeletal Examination: Muscle strength 5/5 to all muscle groups b/l.  No pain, crepitus or joint discomfort with active/passive ROM.  Neurological  Examination: Sensation diminished b/l with 10 gram monofilament.  Assessment: 1. Mycotic nail infection with pain 1-5 b/l 2.   Corn right 3rd digit 3.   Callus submet head 1 right foot 4.   NIDDM with neuropathy  Plan: 1. Toenails 1-5 b/l were debrided in length and girth without iatrogenic laceration. 2. Corn(s) right 3rd digit pared utilizing sterile scalpel blade without incident.Callus pared submetatarsal head(s) 1 right foot utilizing sterile scalpel blade without incident. 3. Continue soft, supportive shoe gear daily. 4. Report any pedal injuries to medical professional. 5. Follow up 3 months. 6. Patient/POA to call should there be a question/concern in there interim.

## 2018-12-23 DIAGNOSIS — R1013 Epigastric pain: Secondary | ICD-10-CM | POA: Diagnosis not present

## 2019-02-11 DIAGNOSIS — E039 Hypothyroidism, unspecified: Secondary | ICD-10-CM | POA: Diagnosis not present

## 2019-02-11 DIAGNOSIS — E785 Hyperlipidemia, unspecified: Secondary | ICD-10-CM | POA: Diagnosis not present

## 2019-02-11 DIAGNOSIS — E1151 Type 2 diabetes mellitus with diabetic peripheral angiopathy without gangrene: Secondary | ICD-10-CM | POA: Diagnosis not present

## 2019-02-11 DIAGNOSIS — I10 Essential (primary) hypertension: Secondary | ICD-10-CM | POA: Diagnosis not present

## 2019-02-11 DIAGNOSIS — N189 Chronic kidney disease, unspecified: Secondary | ICD-10-CM | POA: Diagnosis not present

## 2019-03-19 DIAGNOSIS — M109 Gout, unspecified: Secondary | ICD-10-CM | POA: Diagnosis not present

## 2019-03-19 DIAGNOSIS — M1180 Other specified crystal arthropathies, unspecified site: Secondary | ICD-10-CM | POA: Diagnosis not present

## 2019-03-19 DIAGNOSIS — M1990 Unspecified osteoarthritis, unspecified site: Secondary | ICD-10-CM | POA: Diagnosis not present

## 2019-03-19 DIAGNOSIS — Z79899 Other long term (current) drug therapy: Secondary | ICD-10-CM | POA: Diagnosis not present

## 2019-03-22 ENCOUNTER — Ambulatory Visit (INDEPENDENT_AMBULATORY_CARE_PROVIDER_SITE_OTHER): Payer: Medicare Other | Admitting: Podiatry

## 2019-03-22 ENCOUNTER — Other Ambulatory Visit: Payer: Self-pay

## 2019-03-22 ENCOUNTER — Encounter: Payer: Self-pay | Admitting: Podiatry

## 2019-03-22 DIAGNOSIS — B351 Tinea unguium: Secondary | ICD-10-CM | POA: Diagnosis not present

## 2019-03-22 DIAGNOSIS — E1142 Type 2 diabetes mellitus with diabetic polyneuropathy: Secondary | ICD-10-CM

## 2019-03-22 DIAGNOSIS — L84 Corns and callosities: Secondary | ICD-10-CM | POA: Diagnosis not present

## 2019-03-22 DIAGNOSIS — M79676 Pain in unspecified toe(s): Secondary | ICD-10-CM | POA: Diagnosis not present

## 2019-03-22 NOTE — Patient Instructions (Signed)

## 2019-03-27 ENCOUNTER — Ambulatory Visit: Payer: Medicare Other | Attending: Internal Medicine

## 2019-03-27 DIAGNOSIS — Z23 Encounter for immunization: Secondary | ICD-10-CM | POA: Insufficient documentation

## 2019-03-27 NOTE — Progress Notes (Signed)
Subjective: Jessica Hawkins presents today for follow up of preventative diabetic foot care: and painful mycotic nails b/l.  Pain interferes with ambulation. Aggravating factors include wearing enclosed shoe gear. Pt also presents with painful corn(s) and callus(es) which interfere with ambulation.   Allergies  Allergen Reactions  . No Known Allergies      Objective: There were no vitals filed for this visit.  Vascular Examination:  capillary fill time to digits <3s b/l, palpable DP pulses b/l, palpable PT pulses b/l, pedal hair sparse b/l and skin temperature gradient within normal limits b/l  Dermatological Examination: Pedal skin with normal turgor, texture and tone bilaterally., No open wounds bilaterally., No interdigital macerations bilaterally. and hyperkeratotic lesion(s) distal tip right 3rd digit and submet head 1 R. No erythema, no edema, no drainage, no flocculence.  onychomycosis of the toenails 1-5 b/l which exhibit, elongation, dystrophy, discoloration, thickening, subungual debris, pain to palpation of nail beds and subungual hematoma  Musculoskeletal: normal muscle strength 5/5 to all lower extremity muscle groups bilaterally and no pain crepitus or joint limitation noted with ROM  Neurological: sensation absent with 10g monofilament  Assessment: 1. Pain due to onychomycosis of toenail   2. Corns and callosities   3. Diabetic peripheral neuropathy associated with type 2 diabetes mellitus (HCC)      Plan: Continue diabetic foot care principles. Literature dispensed on today.  -Toenails 1-5 b/l were debrided in length and girth without iatrogenic bleeding. -The above corns and calluses were debrided without complication or incident. Total number debrided 2 -Patient to continue soft, supportive shoe gear daily. -Patient to report any pedal injuries to medical professional immediately. -Patient/POA to call should there be question/concern in the interim.  Return in  about 3 months (around 06/20/2019).

## 2019-03-27 NOTE — Progress Notes (Signed)
   Covid-19 Vaccination Clinic  Name:  Jessica Hawkins    MRN: [id number] DOB: March 13, 1922  03/27/2019  [name] was observed post Covid-19 immunization for 15 minutes without incidence. She was provided with Vaccine Information Sheet and instruction to access the V-Safe system.   [name] was instructed to call 911 with any severe reactions post vaccine: Marland Kitchen Difficulty breathing  . Swelling of your face and throat  . A fast heartbeat  . A bad rash all over your body  . Dizziness and weakness    Immunizations Administered    Name Date Dose VIS Date Route   Pfizer COVID-19 Vaccine 03/27/2019  2:46 PM 0.3 mL 02/12/2019 Intramuscular   Manufacturer: [employer], [employer]   Lot: [id number]   NDC: [address]-8

## 2019-04-18 ENCOUNTER — Ambulatory Visit: Payer: Medicare Other | Attending: Internal Medicine

## 2019-04-18 DIAGNOSIS — Z23 Encounter for immunization: Secondary | ICD-10-CM | POA: Insufficient documentation

## 2019-04-18 NOTE — Progress Notes (Signed)
   Covid-19 Vaccination Clinic  Name:  Jessica Hawkins    MRN: [id number] DOB: [birth year]  04/18/2019  [name] was observed post Covid-19 immunization for 15 minutes without incidence. She was provided with Vaccine Information Sheet and instruction to access the V-Safe system.   [name] was instructed to call 911 with any severe reactions post vaccine: Marland Kitchen Difficulty breathing  . Swelling of your face and throat  . A fast heartbeat  . A bad rash all over your body  . Dizziness and weakness    Immunizations Administered    Name Date Dose VIS Date Route   Pfizer COVID-19 Vaccine 04/18/2019 10:50 AM 0.3 mL 02/12/2019 Intramuscular   Manufacturer: [employer], [employer]   Lot: [id number]   NDC: [address]-1

## 2019-04-27 ENCOUNTER — Encounter: Payer: Self-pay | Admitting: Neurology

## 2019-04-27 ENCOUNTER — Ambulatory Visit (INDEPENDENT_AMBULATORY_CARE_PROVIDER_SITE_OTHER): Payer: Medicare Other | Admitting: Neurology

## 2019-04-27 ENCOUNTER — Other Ambulatory Visit: Payer: Self-pay

## 2019-04-27 VITALS — BP 137/63 | HR 68 | Temp 97.2°F | Ht 59.0 in | Wt 133.0 lb

## 2019-04-27 DIAGNOSIS — F0390 Unspecified dementia without behavioral disturbance: Secondary | ICD-10-CM | POA: Diagnosis not present
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
## 2019-04-27 NOTE — Patient Instructions (Signed)
It was great to see you guys today! Consider Aricept, Namenda for memory. See you guys back in 1 year or sooner if needed, continue follow-up with PCP.

## 2019-05-04 DIAGNOSIS — H02055 Trichiasis without entropian left lower eyelid: Secondary | ICD-10-CM | POA: Diagnosis not present

## 2019-05-05 NOTE — Progress Notes (Signed)
I have reviewed and agreed above plan. 

## 2019-05-24 DIAGNOSIS — H02055 Trichiasis without entropian left lower eyelid: Secondary | ICD-10-CM | POA: Diagnosis not present

## 2019-06-22 ENCOUNTER — Other Ambulatory Visit: Payer: Self-pay

## 2019-06-22 ENCOUNTER — Encounter: Payer: Self-pay | Admitting: Podiatry

## 2019-06-22 ENCOUNTER — Ambulatory Visit (INDEPENDENT_AMBULATORY_CARE_PROVIDER_SITE_OTHER): Payer: Medicare Other | Admitting: Podiatry

## 2019-06-22 DIAGNOSIS — M79676 Pain in unspecified toe(s): Secondary | ICD-10-CM

## 2019-06-22 DIAGNOSIS — L84 Corns and callosities: Secondary | ICD-10-CM | POA: Diagnosis not present

## 2019-06-22 DIAGNOSIS — E1142 Type 2 diabetes mellitus with diabetic polyneuropathy: Secondary | ICD-10-CM | POA: Diagnosis not present

## 2019-06-22 DIAGNOSIS — B351 Tinea unguium: Secondary | ICD-10-CM | POA: Diagnosis not present

## 2019-06-22 NOTE — Patient Instructions (Addendum)
Apply antibiotic ointment to left 3rd toe once daily for one week.   Diabetes Mellitus and Foot Care Foot care is an important part of your health, especially when you have diabetes. Diabetes may cause you to have problems because of poor blood flow (circulation) to your feet and legs, which can cause your skin to:  Become thinner and drier.  Break more easily.  Heal more slowly.  Peel and crack. You may also have nerve damage (neuropathy) in your legs and feet, causing decreased feeling in them. This means that you may not notice minor injuries to your feet that could lead to more serious problems. Noticing and addressing any potential problems early is the best way to prevent future foot problems. How to care for your feet Foot hygiene  Wash your feet daily with warm water and mild soap. Do not use hot water. Then, pat your feet and the areas between your toes until they are completely dry. Do not soak your feet as this can dry your skin.  Trim your toenails straight across. Do not dig under them or around the cuticle. File the edges of your nails with an emery board or nail file.  Apply a moisturizing lotion or petroleum jelly to the skin on your feet and to dry, brittle toenails. Use lotion that does not contain alcohol and is unscented. Do not apply lotion between your toes. Shoes and socks  Wear clean socks or stockings every day. Make sure they are not too tight. Do not wear knee-high stockings since they may decrease blood flow to your legs.  Wear shoes that fit properly and have enough cushioning. Always look in your shoes before you put them on to be sure there are no objects inside.  To break in new shoes, wear them for just a few hours a day. This prevents injuries on your feet. Wounds, scrapes, corns, and calluses  Check your feet daily for blisters, cuts, bruises, sores, and redness. If you cannot see the bottom of your feet, use a mirror or ask someone for help.  Do  not cut corns or calluses or try to remove them with medicine.  If you find a minor scrape, cut, or break in the skin on your feet, keep it and the skin around it clean and dry. You may clean these areas with mild soap and water. Do not clean the area with peroxide, alcohol, or iodine.  If you have a wound, scrape, corn, or callus on your foot, look at it several times a day to make sure it is healing and not infected. Check for: ? Redness, swelling, or pain. ? Fluid or blood. ? Warmth. ? Pus or a bad smell. General instructions  Do not cross your legs. This may decrease blood flow to your feet.  Do not use heating pads or hot water bottles on your feet. They may burn your skin. If you have lost feeling in your feet or legs, you may not know this is happening until it is too late.  Protect your feet from hot and cold by wearing shoes, such as at the beach or on hot pavement.  Schedule a complete foot exam at least once a year (annually) or more often if you have foot problems. If you have foot problems, report any cuts, sores, or bruises to your health care provider immediately. Contact a health care provider if:  You have a medical condition that increases your risk of infection and you have any cuts,  sores, or bruises on your feet.  You have an injury that is not healing.  You have redness on your legs or feet.  You feel burning or tingling in your legs or feet.  You have pain or cramps in your legs and feet.  Your legs or feet are numb.  Your feet always feel cold.  You have pain around a toenail. Get help right away if:  You have a wound, scrape, corn, or callus on your foot and: ? You have pain, swelling, or redness that gets worse. ? You have fluid or blood coming from the wound, scrape, corn, or callus. ? Your wound, scrape, corn, or callus feels warm to the touch. ? You have pus or a bad smell coming from the wound, scrape, corn, or callus. ? You have a fever. ? You  have a red line going up your leg. Summary  Check your feet every day for cuts, sores, red spots, swelling, and blisters.  Moisturize feet and legs daily.  Wear shoes that fit properly and have enough cushioning.  If you have foot problems, report any cuts, sores, or bruises to your health care provider immediately.  Schedule a complete foot exam at least once a year (annually) or more often if you have foot problems. This information is not intended to replace advice given to you by your health care provider. Make sure you discuss any questions you have with your health care provider. Document Revised: 11/11/2018 Document Reviewed: 03/22/2016 Elsevier Patient Education  [place].  Peripheral Neuropathy Peripheral neuropathy is a type of nerve damage. It affects nerves that carry signals between the spinal cord and the arms, legs, and the rest of the body (peripheral nerves). It does not affect nerves in the spinal cord or brain. In peripheral neuropathy, one nerve or a group of nerves may be damaged. Peripheral neuropathy is a broad category that includes many specific nerve disorders, like diabetic neuropathy, hereditary neuropathy, and carpal tunnel syndrome. What are the causes? This condition may be caused by:  Diabetes. This is the most common cause of peripheral neuropathy.  Nerve injury.  Pressure or stress on a nerve that lasts a long time.  Lack (deficiency) of B vitamins. This can result from alcoholism, poor diet, or a restricted diet.  Infections.  Autoimmune diseases, such as rheumatoid arthritis and systemic lupus erythematosus.  Nerve diseases that are passed from parent to child (inherited).  Some medicines, such as cancer medicines (chemotherapy).  Poisonous (toxic) substances, such as lead and mercury.  Too little blood flowing to the legs.  Kidney disease.  Thyroid disease. In some cases, the cause of this condition is not known. What are the  signs or symptoms? Symptoms of this condition depend on which of your nerves is damaged. Common symptoms include:  Loss of feeling (numbness) in the feet, hands, or both.  Tingling in the feet, hands, or both.  Burning pain.  Very sensitive skin.  Weakness.  Not being able to move a part of the body (paralysis).  Muscle twitching.  Clumsiness or poor coordination.  Loss of balance.  Not being able to control your bladder.  Feeling dizzy.  Sexual problems. How is this diagnosed? Diagnosing and finding the cause of peripheral neuropathy can be difficult. Your health care provider will take your medical history and do a physical exam. A neurological exam will also be done. This involves checking things that are affected by your brain, spinal cord, and nerves (nervous system).  For example, your health care provider will check your reflexes, how you move, and what you can feel. You may have other tests, such as:  Blood tests.  Electromyogram (EMG) and nerve conduction tests. These tests check nerve function and how well the nerves are controlling the muscles.  Imaging tests, such as CT scans or MRI to rule out other causes of your symptoms.  Removing a small piece of nerve to be examined in a lab (nerve biopsy). This is rare.  Removing and examining a small amount of the fluid that surrounds the brain and spinal cord (lumbar puncture). This is rare. How is this treated? Treatment for this condition may involve:  Treating the underlying cause of the neuropathy, such as diabetes, kidney disease, or vitamin deficiencies.  Stopping medicines that can cause neuropathy, such as chemotherapy.  Medicine to relieve pain. Medicines may include: ? Prescription or over-the-counter pain medicine. ? Antiseizure medicine. ? Antidepressants. ? Pain-relieving patches that are applied to painful areas of skin.  Surgery to relieve pressure on a nerve or to destroy a nerve that is causing  pain.  Physical therapy to help improve movement and balance.  Devices to help you move around (assistive devices). Follow these instructions at home: Medicines  Take over-the-counter and prescription medicines only as told by your health care provider. Do not take any other medicines without first asking your health care provider.  Do not drive or use heavy machinery while taking prescription pain medicine. Lifestyle   Do not use any products that contain nicotine or tobacco, such as cigarettes and e-cigarettes. Smoking keeps blood from reaching damaged nerves. If you need help quitting, ask your health care provider.  Avoid or limit alcohol. Too much alcohol can cause a vitamin B deficiency, and vitamin B is needed for healthy nerves.  Eat a healthy diet. This includes: ? Eating foods that are high in fiber, such as fresh fruits and vegetables, whole grains, and beans. ? Limiting foods that are high in fat and processed sugars, such as fried or sweet foods. General instructions   If you have diabetes, work closely with your health care provider to keep your blood sugar under control.  If you have numbness in your feet: ? Check every day for signs of injury or infection. Watch for redness, warmth, and swelling. ? Wear padded socks and comfortable shoes. These help protect your feet.  Develop a good support system. Living with peripheral neuropathy can be stressful. Consider talking with a mental health specialist or joining a support group.  Use assistive devices and attend physical therapy as told by your health care provider. This may include using a walker or a cane.  Keep all follow-up visits as told by your health care provider. This is important. Contact a health care provider if:  You have new signs or symptoms of peripheral neuropathy.  You are struggling emotionally from dealing with peripheral neuropathy.  Your pain is not well-controlled. Get help right away  if:  You have an injury or infection that is not healing normally.  You develop new weakness in an arm or leg.  You fall frequently. Summary  Peripheral neuropathy is when the nerves in the arms, or legs are damaged, resulting in numbness, weakness, or pain.  There are many causes of peripheral neuropathy, including diabetes, pinched nerves, vitamin deficiencies, autoimmune disease, and hereditary conditions.  Diagnosing and finding the cause of peripheral neuropathy can be difficult. Your health care provider will take your medical history,  do a physical exam, and do tests, including blood tests and nerve function tests.  Treatment involves treating the underlying cause of the neuropathy and taking medicines to help control pain. Physical therapy and assistive devices may also help. This information is not intended to replace advice given to you by your health care provider. Make sure you discuss any questions you have with your health care provider. Document Revised: 01/31/2017 Document Reviewed: 04/29/2016 Elsevier Patient Education  2020 [employer]  Corns and Calluses Corns are small areas of thickened skin that occur on the top, sides, or tip of a toe. They contain a cone-shaped core with a point that can press on a nerve below. This causes pain.  Calluses are areas of thickened skin that can occur anywhere on the body, including the hands, fingers, palms, soles of the feet, and heels. Calluses are usually larger than corns. What are the causes? Corns and calluses are caused by rubbing (friction) or pressure, such as from shoes that are too tight or do not fit properly. What increases the risk? Corns are more likely to develop in people who have misshapen toes (toe deformities), such as hammer toes. Calluses can occur with friction to any area of the skin. They are more likely to develop in people who:  Work with their hands.  Wear shoes that fit poorly, are too tight, or are  high-heeled.  Have toe deformities. What are the signs or symptoms? Symptoms of a corn or callus include:  A hard growth on the skin.  Pain or tenderness under the skin.  Redness and swelling.  Increased discomfort while wearing tight-fitting shoes, if your feet are affected. If a corn or callus becomes infected, symptoms may include:  Redness and swelling that gets worse.  Pain.  Fluid, blood, or pus draining from the corn or callus. How is this diagnosed? Corns and calluses may be diagnosed based on your symptoms, your medical history, and a physical exam. How is this treated? Treatment for corns and calluses may include:  Removing the cause of the friction or pressure. This may involve: ? Changing your shoes. ? Wearing shoe inserts (orthotics) or other protective layers in your shoes, such as a corn pad. ? Wearing gloves.  Applying medicine to the skin (topical medicine) to help soften skin in the hardened, thickened areas.  Removing layers of dead skin with a file to reduce the size of the corn or callus.  Removing the corn or callus with a scalpel or laser.  Taking antibiotic medicines, if your corn or callus is infected.  Having surgery, if a toe deformity is the cause. Follow these instructions at home:   Take over-the-counter and prescription medicines only as told by your health care provider.  If you were prescribed an antibiotic, take it as told by your health care provider. Do not stop taking it even if your condition starts to improve.  Wear shoes that fit well. Avoid wearing high-heeled shoes and shoes that are too tight or too loose.  Wear any padding, protective layers, gloves, or orthotics as told by your health care provider.  Soak your hands or feet and then use a file or pumice stone to soften your corn or callus. Do this as told by your health care provider.  Check your corn or callus every day for symptoms of infection. Contact a health care  provider if you:  Notice that your symptoms do not improve with treatment.  Have redness or swelling that gets  worse.  Notice that your corn or callus becomes painful.  Have fluid, blood, or pus coming from your corn or callus.  Have new symptoms. Summary  Corns are small areas of thickened skin that occur on the top, sides, or tip of a toe.  Calluses are areas of thickened skin that can occur anywhere on the body, including the hands, fingers, palms, and soles of the feet. Calluses are usually larger than corns.  Corns and calluses are caused by rubbing (friction) or pressure, such as from shoes that are too tight or do not fit properly.  Treatment may include wearing any padding, protective layers, gloves, or orthotics as told by your health care provider. This information is not intended to replace advice given to you by your health care provider. Make sure you discuss any questions you have with your health care provider. Document Revised: 06/10/2018 Document Reviewed: 01/01/2017 Elsevier Patient Education  2020 [employer].  Onychomycosis/Fungal Toenails  WHAT IS IT? An infection that lies within the keratin of your nail plate that is caused by a fungus.  WHY ME? Fungal infections affect all ages, sexes, races, and creeds.  There may be many factors that predispose you to a fungal infection such as age, coexisting medical conditions such as diabetes, or an autoimmune disease; stress, medications, fatigue, genetics, etc.  Bottom line: fungus thrives in a warm, moist environment and your shoes offer such a location.  IS IT CONTAGIOUS? Theoretically, yes.  You do not want to share shoes, nail clippers or files with someone who has fungal toenails.  Walking around barefoot in the same room or sleeping in the same bed is unlikely to transfer the organism.  It is important to realize, however, that fungus can spread easily from one nail to the next on the same foot.  HOW DO WE TREAT  THIS?  There are several ways to treat this condition.  Treatment may depend on many factors such as age, medications, pregnancy, liver and kidney conditions, etc.  It is best to ask your doctor which options are available to you.  20. No treatment.   Unlike many other medical concerns, you can live with this condition.  However for many people this can be a painful condition and may lead to ingrown toenails or a bacterial infection.  It is recommended that you keep the nails cut short to help reduce the amount of fungal nail. 21. Topical treatment.  These range from herbal remedies to prescription strength nail lacquers.  About 40-50% effective, topicals require twice daily application for approximately 9 to 12 months or until an entirely new nail has grown out.  The most effective topicals are medical grade medications available through physicians offices. 22. Oral antifungal medications.  With an 80-90% cure rate, the most common oral medication requires 3 to 4 months of therapy and stays in your system for a year as the new nail grows out.  Oral antifungal medications do require blood work to make sure it is a safe drug for you.  A liver function panel will be performed prior to starting the medication and after the first month of treatment.  It is important to have the blood work performed to avoid any harmful side effects.  In general, this medication safe but blood work is required. 23. Laser Therapy.  This treatment is performed by applying a specialized laser to the affected nail plate.  This therapy is noninvasive, fast, and non-painful.  It is not covered by insurance  and is therefore, out of pocket.  The results have been very good with a 80-95% cure rate.  [facility] is the only practice in the area to offer this therapy. 24. Permanent Nail Avulsion.  Removing the entire nail so that a new nail will not grow back.

## 2019-06-24 NOTE — Progress Notes (Addendum)
Subjective: Jessica Hawkins presents today for follow up of at risk foot care with history of diabetic neuropathy and corn(s) right 3rd digit and callus(es) plantar aspect right foot and painful mycotic nails b/l.  Pain interferes with ambulation. Aggravating factors include wearing enclosed shoe gear.   She is accompanied by her caregiver on today's visit. They voice no new pedal concerns on today's visit.   Allergies  Allergen Reactions  . No Known Allergies      Objective: There were no vitals filed for this visit.  Pt is a pleasant 86 y.o. year old AA female  in NAD. AAO x 3.   Vascular Examination:  Capillary fill time to digits <3 seconds b/l. Palpable DP pulses b/l. Palpable PT pulses b/l. Pedal hair sparse b/l. Skin temperature gradient within normal limits b/l. No edema noted b/l.  Dermatological Examination: Pedal skin with normal turgor, texture and tone bilaterally. No open wounds bilaterally. No interdigital macerations bilaterally. Toenails 1-5 b/l elongated, dystrophic, thickened, crumbly with subungual debris and tenderness to dorsal palpation. Hyperkeratotic lesion(s) submet head 1 right foot.  No erythema, no edema, no drainage, no flocculence. R 3rd toe with hyperkeratosis and visible subdermal hemorrhage. No erythema, no edema, no drainage, no flocculence.  Musculoskeletal: Normal muscle strength 5/5 to all lower extremity muscle groups bilaterally, no pain crepitus or joint limitation noted with ROM b/l and hammertoes noted to the  2-5 bilaterally  Neurological: Protective sensation intact 5/5 intact bilaterally with 10g monofilament b/l. Vibratory sensation intact b/l. Babinski reflex negative b/l. Clonus negative b/l.   Assessment: 1. Pain due to onychomycosis of toenail   2. Corns and callosities   3. Diabetic peripheral neuropathy associated with type 2 diabetes mellitus (HCC)    Plan: -Continue diabetic foot care principles. Literature dispensed on today.   -Toenails 1-5 b/l were debrided in length and girth with sterile nail nippers and dremel without iatrogenic bleeding.  -Corn(s) R 3rd toe and callus(es) submet head 1 right foot were debrided without complication or incident. Total number debrided 2. -Caregiver instructed to apply antibiotic ointment to right 3rd digit once daily for one week. She related understanding. -Patient to continue soft, supportive shoe gear daily. -Patient to report any pedal injuries to medical professional immediately. -Patient/POA to call should there be question/concern in the interim.  Return in about 3 months (around 09/21/2019) for diabetic nail and callus trim.

## 2019-07-21 ENCOUNTER — Ambulatory Visit (INDEPENDENT_AMBULATORY_CARE_PROVIDER_SITE_OTHER): Payer: Medicare Other

## 2019-07-21 ENCOUNTER — Other Ambulatory Visit: Payer: Self-pay

## 2019-07-21 ENCOUNTER — Ambulatory Visit (INDEPENDENT_AMBULATORY_CARE_PROVIDER_SITE_OTHER): Payer: Medicare Other | Admitting: Podiatrist

## 2019-07-21 ENCOUNTER — Other Ambulatory Visit: Payer: Self-pay | Admitting: Podiatrist

## 2019-07-21 DIAGNOSIS — M79671 Pain in right foot: Secondary | ICD-10-CM | POA: Diagnosis not present

## 2019-07-21 DIAGNOSIS — M109 Gout, unspecified: Secondary | ICD-10-CM | POA: Diagnosis not present

## 2019-07-21 MED ORDER — COLCHICINE 0.6 MG PO TABS: 0.6000 mg | ORAL_TABLET | Freq: Every day | ORAL | 2 refills | Status: DC

## 2019-07-21 NOTE — Progress Notes (Signed)
  Chief Complaint  Patient presents with  . Foot Pain    Right 1st/medial foot generalized foot pain, unknown duration, no known injuries.     HPI: Patient is 86 y.o. female who presents today for the concerns as listed above. She presents today with her family member who relates she has a history of gout but this is the first time she has experienced similar pain in her feet.  She has pain located at the right first mpj region that came about suddenly and hasn't resolved with rest.     Review of Systems No fevers, chills, nausea, muscle aches, no difficulty breathing, no calf pain, no chest pain or shortness of breath.   Physical Exam  GENERAL APPEARANCE: Alert, conversant. Appropriately groomed. No acute distress.   VASCULAR: Pedal pulses palpable DP and PT bilateral.  Capillary refill time is immediate to all digits,  Proximal to distal cooling it warm to warm.    NEUROLOGIC: sensation is intact epicritically and protectively to 5.07 monofilament at 5/5 sites bilateral.  Light touch is intact bilateral, vibratory sensation intact bilateral, achilles tendon reflex is intact bilateral.   MUSCULOSKELETAL: pain on palpation medial aspect and first mpj right noted with palpation and rom.  Contracture lesser digits noted.    DERMATOLOGIC: skin is warm, supple, and dry.  No open lesions noted.  No rash, no pre ulcerative lesions. Digital nails are asymptomatic.    Radiographic exam: Diffuse osteopenia noted, large bunion deformity present.  No acute fracture or dislocation present. Mild swelling first mpj noted medially.    Assessment     ICD-10-CM   1. Pain in right foot  M79.671 CANCELED: DG Foot Complete Right  2. Acute gout involving toe of right foot, unspecified cause  M10.9      Plan  Discussed etiology, pathology, conservative vs. Surgical therapies and at this time an injection was recommended.  The patient agreed and a sterile skin prep was applied.  An injection  consisting of dexamethasone and marcaine mixture was infiltrated into the first mpj of the right foot.  The patient tolerated this well and was given information on gout and  instructions for aftercare.  rx for colchicine also written as she has taken this in the past for gout flares. She will return if the symptoms fail to resolve in 1-2 weeks.

## 2019-07-21 NOTE — Patient Instructions (Addendum)
Wear your athletic shoes whenever you have on clothes for the day-  Inspect the toes for any redness related to pressure at the end of the day.    Gout  Gout is painful swelling of your joints. Gout is a type of arthritis. It is caused by having too much uric acid in your body. Uric acid is a chemical that is made when your body breaks down substances called purines. If your body has too much uric acid, sharp crystals can form and build up in your joints. This causes pain and swelling. Gout attacks can happen quickly and be very painful (acute gout). Over time, the attacks can affect more joints and happen more often (chronic gout). What are the causes?  Too much uric acid in your blood. This can happen because: ? Your kidneys do not remove enough uric acid from your blood. ? Your body makes too much uric acid. ? You eat too many foods that are high in purines. These foods include organ meats, some seafood, and beer.  Trauma or stress. What increases the risk?  Having a family history of gout.  Being female and middle-aged.  Being female and having gone through menopause.  Being very overweight (obese).  Drinking alcohol, especially beer.  Not having enough water in the body (being dehydrated).  Losing weight too quickly.  Having an organ transplant.  Having lead poisoning.  Taking certain medicines.  Having kidney disease.  Having a skin condition called psoriasis. What are the signs or symptoms? An attack of acute gout usually happens in just one joint. The most common place is the big toe. Attacks often start at night. Other joints that may be affected include joints of the feet, ankle, knee, fingers, wrist, or elbow. Symptoms of an attack may include:  Very bad pain.  Warmth.  Swelling.  Stiffness.  Shiny, red, or purple skin.  Tenderness. The affected joint may be very painful to touch.  Chills and fever. Chronic gout may cause symptoms more often. More  joints may be involved. You may also have white or yellow lumps (tophi) on your hands or feet or in other areas near your joints. How is this treated?  Treatment for this condition has two phases: treating an acute attack and preventing future attacks.  Acute gout treatment may include: ? NSAIDs. ? Steroids. These are taken by mouth or injected into a joint. ? Colchicine. This medicine relieves pain and swelling. It can be given by mouth or through an IV tube.  Preventive treatment may include: ? Taking small doses of NSAIDs or colchicine daily. ? Using a medicine that reduces uric acid levels in your blood. ? Making changes to your diet. You may need to see a food expert (dietitian) about what to eat and drink to prevent gout. Follow these instructions at home: During a gout attack   If told, put ice on the painful area: ? Put ice in a plastic bag. ? Place a towel between your skin and the bag. ? Leave the ice on for 20 minutes, 2-3 times a day.  Raise (elevate) the painful joint above the level of your heart as often as you can.  Rest the joint as much as possible. If the joint is in your leg, you may be given crutches.  Follow instructions from your doctor about what you cannot eat or drink. Avoiding future gout attacks  Eat a low-purine diet. Avoid foods and drinks such as: ? Liver. ? Kidney. ?  Anchovies. ? Asparagus. ? Herring. ? Mushrooms. ? Mussels. ? Beer.  Stay at a healthy weight. If you want to lose weight, talk with your doctor. Do not lose weight too fast.  Start or continue an exercise plan as told by your doctor. Eating and drinking  Drink enough fluids to keep your pee (urine) pale yellow.  If you drink alcohol: ? Limit how much you use to:  0-1 drink a day for women.  0-2 drinks a day for men. ? Be aware of how much alcohol is in your drink. In the [place] one drink equals one 12 oz bottle of beer (355 mL), one 5 oz glass of wine (148 mL), or one 1  oz glass of hard liquor (44 mL). General instructions  Take over-the-counter and prescription medicines only as told by your doctor.  Do not drive or use heavy machinery while taking prescription pain medicine.  Return to your normal activities as told by your doctor. Ask your doctor what activities are safe for you.  Keep all follow-up visits as told by your doctor. This is important. Contact a doctor if:  You have another gout attack.  You still have symptoms of a gout attack after 10 days of treatment.  You have problems (side effects) because of your medicines.  You have chills or a fever.  You have burning pain when you pee (urinate).  You have pain in your lower back or belly. Get help right away if:  You have very bad pain.  Your pain cannot be controlled.  You cannot pee. Summary  Gout is painful swelling of the joints.  The most common site of pain is the big toe, but it can affect other joints.  Medicines and avoiding some foods can help to prevent and treat gout attacks. This information is not intended to replace advice given to you by your health care provider. Make sure you discuss any questions you have with your health care provider. Document Revised: 09/10/2017 Document Reviewed: 09/10/2017 Elsevier Patient Education  2020 [employer].

## 2019-07-22 ENCOUNTER — Telehealth: Payer: Self-pay | Admitting: *Deleted

## 2019-07-22 NOTE — Telephone Encounter (Signed)
Left message informing pt's dtr, Windell Moulding not to take the colchicine 2 tablets daily it could cause severe diarrhea, if pt is able to take regular strength tylenol may take as OTC package directs.

## 2019-07-22 NOTE — Telephone Encounter (Signed)
Pt's dtr, Windell Moulding states Dr. Irving Shows prescribed colchicine yesterday to be taken every other day or daily and pt was also given an injection, but is still in pain and would like to know if can take 2 tablets a day.

## 2019-07-28 ENCOUNTER — Encounter: Payer: Self-pay | Admitting: Podiatrist

## 2019-09-21 ENCOUNTER — Other Ambulatory Visit: Payer: Self-pay

## 2019-09-21 ENCOUNTER — Ambulatory Visit (INDEPENDENT_AMBULATORY_CARE_PROVIDER_SITE_OTHER): Payer: Medicare Other | Admitting: Podiatry

## 2019-09-21 ENCOUNTER — Encounter: Payer: Self-pay | Admitting: Podiatry

## 2019-09-21 DIAGNOSIS — M79676 Pain in unspecified toe(s): Secondary | ICD-10-CM | POA: Diagnosis not present

## 2019-09-21 DIAGNOSIS — E1142 Type 2 diabetes mellitus with diabetic polyneuropathy: Secondary | ICD-10-CM

## 2019-09-21 DIAGNOSIS — E119 Type 2 diabetes mellitus without complications: Secondary | ICD-10-CM | POA: Diagnosis not present

## 2019-09-21 DIAGNOSIS — L89899 Pressure ulcer of other site, unspecified stage: Secondary | ICD-10-CM

## 2019-09-21 DIAGNOSIS — L84 Corns and callosities: Secondary | ICD-10-CM

## 2019-09-21 DIAGNOSIS — B351 Tinea unguium: Secondary | ICD-10-CM | POA: Diagnosis not present

## 2019-09-21 NOTE — Patient Instructions (Signed)
Apply tea tree oil to toenails once daily for nail fungus.  Apply Neosporin to distal tip of left great toe once daily. Try to identify shoes which may cause pressure to tip of toe. If shoes are too big, her foot may slide back and forth and cause this type of pressure. If shoes are too small, it can cause this type of pressure. Please monitor toe for any changes. If you notice any, please call office immediately.

## 2019-09-23 DIAGNOSIS — M1180 Other specified crystal arthropathies, unspecified site: Secondary | ICD-10-CM | POA: Diagnosis not present

## 2019-09-23 DIAGNOSIS — M109 Gout, unspecified: Secondary | ICD-10-CM | POA: Diagnosis not present

## 2019-09-23 DIAGNOSIS — Z79899 Other long term (current) drug therapy: Secondary | ICD-10-CM | POA: Diagnosis not present

## 2019-09-23 DIAGNOSIS — M1990 Unspecified osteoarthritis, unspecified site: Secondary | ICD-10-CM | POA: Diagnosis not present
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
## 2019-11-30 DIAGNOSIS — W19XXXA Unspecified fall, initial encounter: Secondary | ICD-10-CM | POA: Diagnosis not present

## 2019-11-30 DIAGNOSIS — M10041 Idiopathic gout, right hand: Secondary | ICD-10-CM | POA: Diagnosis not present

## 2019-11-30 DIAGNOSIS — T148XXA Other injury of unspecified body region, initial encounter: Secondary | ICD-10-CM | POA: Diagnosis not present

## 2019-12-06 DIAGNOSIS — Z23 Encounter for immunization: Secondary | ICD-10-CM | POA: Diagnosis not present

## 2019-12-09 ENCOUNTER — Other Ambulatory Visit: Payer: Self-pay | Admitting: Podiatrist

## 2019-12-09 MED ORDER — COLCHICINE 0.6 MG PO TABS: 0.6000 mg | ORAL_TABLET | Freq: Every day | ORAL | 2 refills | Status: DC

## 2019-12-15 DIAGNOSIS — M810 Age-related osteoporosis without current pathological fracture: Secondary | ICD-10-CM | POA: Diagnosis not present

## 2019-12-15 DIAGNOSIS — M8538 Osteitis condensans, other site: Secondary | ICD-10-CM | POA: Diagnosis not present

## 2019-12-15 DIAGNOSIS — M545 Low back pain, unspecified: Secondary | ICD-10-CM | POA: Diagnosis not present

## 2019-12-15 DIAGNOSIS — M533 Sacrococcygeal disorders, not elsewhere classified: Secondary | ICD-10-CM | POA: Diagnosis not present

## 2019-12-22 ENCOUNTER — Encounter: Payer: Self-pay | Admitting: Podiatry

## 2019-12-22 ENCOUNTER — Ambulatory Visit (INDEPENDENT_AMBULATORY_CARE_PROVIDER_SITE_OTHER): Payer: Medicare Other | Admitting: Podiatry

## 2019-12-22 ENCOUNTER — Other Ambulatory Visit: Payer: Self-pay

## 2019-12-22 DIAGNOSIS — B351 Tinea unguium: Secondary | ICD-10-CM | POA: Diagnosis not present

## 2019-12-22 DIAGNOSIS — L84 Corns and callosities: Secondary | ICD-10-CM | POA: Diagnosis not present

## 2019-12-22 DIAGNOSIS — M79676 Pain in unspecified toe(s): Secondary | ICD-10-CM

## 2019-12-22 DIAGNOSIS — E119 Type 2 diabetes mellitus without complications: Secondary | ICD-10-CM
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
## 2020-01-11 ENCOUNTER — Other Ambulatory Visit: Payer: Self-pay

## 2020-01-11 ENCOUNTER — Ambulatory Visit: Payer: Medicare Other | Admitting: Orthotics

## 2020-01-11 DIAGNOSIS — M79676 Pain in unspecified toe(s): Secondary | ICD-10-CM

## 2020-01-11 DIAGNOSIS — L84 Corns and callosities: Secondary | ICD-10-CM

## 2020-01-11 DIAGNOSIS — B351 Tinea unguium: Secondary | ICD-10-CM

## 2020-01-11 DIAGNOSIS — N3946 Mixed incontinence: Secondary | ICD-10-CM | POA: Diagnosis not present

## 2020-01-11 DIAGNOSIS — E119 Type 2 diabetes mellitus without complications: Secondary | ICD-10-CM

## 2020-01-11 NOTE — Progress Notes (Signed)
Patient measured and cast to day for shoes and CMFO ([id number]); due to advanced age, daughter will return and pick out size 11 xw shoe.   Patienthas b/l bunions.

## 2020-01-17 ENCOUNTER — Telehealth: Payer: Self-pay | Admitting: Podiatry

## 2020-01-17 NOTE — Telephone Encounter (Signed)
pts daughter Windell Moulding left message stating she dropped off the signed paperwork for her mom to get the diabetic shoes.  I returned call and told pt I did receive the paperwork and have already faxed it to the company.

## 2020-02-23 ENCOUNTER — Encounter: Payer: Self-pay | Admitting: Podiatry

## 2020-02-23 ENCOUNTER — Ambulatory Visit (INDEPENDENT_AMBULATORY_CARE_PROVIDER_SITE_OTHER): Payer: Medicare Other | Admitting: Podiatry

## 2020-02-23 ENCOUNTER — Other Ambulatory Visit: Payer: Self-pay

## 2020-02-23 DIAGNOSIS — M2042 Other hammer toe(s) (acquired), left foot: Secondary | ICD-10-CM | POA: Diagnosis not present

## 2020-02-23 DIAGNOSIS — Z8269 Family history of other diseases of the musculoskeletal system and connective tissue: Secondary | ICD-10-CM | POA: Insufficient documentation

## 2020-02-23 DIAGNOSIS — M109 Gout, unspecified: Secondary | ICD-10-CM | POA: Insufficient documentation

## 2020-02-23 DIAGNOSIS — B351 Tinea unguium: Secondary | ICD-10-CM | POA: Diagnosis not present

## 2020-02-23 DIAGNOSIS — L84 Corns and callosities: Secondary | ICD-10-CM

## 2020-02-23 DIAGNOSIS — M2041 Other hammer toe(s) (acquired), right foot: Secondary | ICD-10-CM

## 2020-02-23 DIAGNOSIS — T148XXA Other injury of unspecified body region, initial encounter: Secondary | ICD-10-CM | POA: Diagnosis not present

## 2020-02-23 DIAGNOSIS — E119 Type 2 diabetes mellitus without complications: Secondary | ICD-10-CM

## 2020-02-23 DIAGNOSIS — M79676 Pain in unspecified toe(s): Secondary | ICD-10-CM | POA: Diagnosis not present

## 2020-02-23 DIAGNOSIS — Z7982 Long term (current) use of aspirin: Secondary | ICD-10-CM | POA: Insufficient documentation

## 2020-02-23 DIAGNOSIS — M1180 Other specified crystal arthropathies, unspecified site: Secondary | ICD-10-CM | POA: Insufficient documentation

## 2020-02-23 DIAGNOSIS — N189 Chronic kidney disease, unspecified: Secondary | ICD-10-CM | POA: Insufficient documentation

## 2020-02-23 DIAGNOSIS — M10041 Idiopathic gout, right hand: Secondary | ICD-10-CM | POA: Insufficient documentation

## 2020-02-23 DIAGNOSIS — Z8781 Personal history of (healed) traumatic fracture: Secondary | ICD-10-CM | POA: Insufficient documentation
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
## 2020-03-01 ENCOUNTER — Other Ambulatory Visit: Payer: Self-pay

## 2020-03-01 ENCOUNTER — Ambulatory Visit (INDEPENDENT_AMBULATORY_CARE_PROVIDER_SITE_OTHER): Payer: Medicare Other | Admitting: Podiatry

## 2020-03-01 DIAGNOSIS — S90424A Blister (nonthermal), right lesser toe(s), initial encounter: Secondary | ICD-10-CM | POA: Diagnosis not present
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
## 2020-04-05 ENCOUNTER — Ambulatory Visit: Payer: Medicare Other | Admitting: Podiatry

## 2020-04-26 ENCOUNTER — Encounter: Payer: Self-pay | Admitting: Podiatry

## 2020-04-26 ENCOUNTER — Other Ambulatory Visit: Payer: Self-pay

## 2020-04-26 ENCOUNTER — Ambulatory Visit (INDEPENDENT_AMBULATORY_CARE_PROVIDER_SITE_OTHER): Payer: Medicare Other | Admitting: Podiatry

## 2020-04-26 DIAGNOSIS — M79676 Pain in unspecified toe(s): Secondary | ICD-10-CM

## 2020-04-26 DIAGNOSIS — L89891 Pressure ulcer of other site, stage 1: Secondary | ICD-10-CM | POA: Diagnosis not present

## 2020-04-26 DIAGNOSIS — B351 Tinea unguium: Secondary | ICD-10-CM | POA: Diagnosis not present

## 2020-04-26 DIAGNOSIS — L84 Corns and callosities: Secondary | ICD-10-CM

## 2020-04-26 DIAGNOSIS — M2042 Other hammer toe(s) (acquired), left foot: Secondary | ICD-10-CM

## 2020-04-26 DIAGNOSIS — M2041 Other hammer toe(s) (acquired), right foot: Secondary | ICD-10-CM

## 2020-04-26 DIAGNOSIS — E1142 Type 2 diabetes mellitus with diabetic polyneuropathy: Secondary | ICD-10-CM

## 2020-04-30 NOTE — Progress Notes (Signed)
Subjective: Jessica Hawkins is a pleasant 88 y.o. female patient seen today preventative diabetic foot care and painful corn(s) right foot and callus(es) right foot and painful mycotic nails.  Pain interferes with ambulation. Aggravating factors include wearing enclosed shoe gear.   Jessica Hawkins likes her new diabetic shoes.   Her daughter, Jessica Hawkins, and caregiver are present during today's visit. Daughter, Jessica Hawkins, is concerned about new development of painful right 2nd and right 3rd toes. She states Jessica Hawkins has pain when these two toes are touched. Denies any redness, swelling, drainage or odor.  Dr. Merri Brunette is her PCP. Last visit was 12/15/2019.   Allergies  Allergen Reactions  . No Known Allergies     Objective: Physical Exam  General: Jessica Hawkins is a pleasant 87 y.o. African American female, WD, WN in NAD. AAO x 3.   Vascular:  Capillary fill time to digits <3 seconds b/l lower extremities. Palpable pedal pulses b/l LE. Pedal hair sparse. Lower extremity skin temperature gradient within normal limits. No pain with calf compression b/l. No ischemia or gangrene noted b/l lower extremities.  Dermatological:  Pedal skin with normal turgor, texture and tone bilaterally. No interdigital macerations bilaterally. Toenails 1-5 b/l elongated, discolored, dystrophic, thickened, crumbly with subungual debris and tenderness to dorsal palpation. Hyperkeratotic lesion(s) distal tip b/l 3rd toes and distal tip left hallux.  No erythema, no edema, no drainage, no fluctuance, no blistering.   Right 2nd digit medial PIPJ blister has resolved and is completely healed. The lateral aspect of the right 2nd digit and medial aspect of the 3rd digit are noted to have signs of pressure sores at the PIPJs interdigitally. No blister formation, but there is erythema consistent with stage 1 pressure sore. There is tenderness to palpation. No edema, no drainage, no odor. Areas remain closed and no indication for  xray today.  Musculoskeletal:  Normal muscle strength 5/5 to all lower extremity muscle groups bilaterally. No pain crepitus or joint limitation noted with ROM b/l. Hammertoes noted to the 2-5 bilaterally.  Neurological:  Protective sensation intact 5/5 intact bilaterally with 10g monofilament b/l. Vibratory sensation intact b/l. Proprioception intact bilaterally. Clonus negative b/l.  Assessment and Plan:  1. Pain due to onychomycosis of toenail   2. Pressure injury of toe of right foot, stage 1   3. Corns and callosities   4. Acquired hammertoes of both feet   5. Diabetic peripheral neuropathy associated with type 2 diabetes mellitus (HCC)     -Examined patient. -For pressure ulcers of right 2nd, 3rd digits, goal is to keep pressure off of toe. Dispensed foam toe separators to keep pressure off of digit. Educated daughter Jessica Hawkins and caregiver on signs of infection: redness, drainage, swelling of digit, pain. They were instructed to call office if any concerns arise.  -Continue diabetic foot care principles. -Instructed daughter to purchase diabetic house slippers. Also purchase toe separators from [employer] and apply daily between affected toes. -Advised Jessica Hawkins she cannot wear any leather shoes nor pointed toe shoes. She must wear wide shoes due to bunion/hammertoe deformities. -Toenails 1-5 b/l were debrided in length and girth with sterile nail nippers and dremel without iatrogenic bleeding.  -Corn(s) b/l  3rd toes and callus(es) distal tip left hallux were pared utilizing sterile scalpel blade without incident. Total number debrided =3. -Patient to report any pedal injuries to medical professional immediately. -Daughter, Jessica Hawkins, is to call me or contact me through Mychart should there be question/concern in the interim.  Return  in about 10 weeks (around 07/05/2020).  Freddie Breech, DPM

## 2020-05-04 DIAGNOSIS — M109 Gout, unspecified: Secondary | ICD-10-CM | POA: Diagnosis not present

## 2020-05-04 DIAGNOSIS — Z79899 Other long term (current) drug therapy: Secondary | ICD-10-CM | POA: Diagnosis not present

## 2020-05-04 DIAGNOSIS — N1831 Chronic kidney disease, stage 3a: Secondary | ICD-10-CM | POA: Diagnosis not present

## 2020-05-04 DIAGNOSIS — M1180 Other specified crystal arthropathies, unspecified site: Secondary | ICD-10-CM | POA: Diagnosis not present

## 2020-05-04 DIAGNOSIS — M1990 Unspecified osteoarthritis, unspecified site: Secondary | ICD-10-CM | POA: Diagnosis not present

## 2020-05-17 ENCOUNTER — Ambulatory Visit: Payer: BC Managed Care – PPO | Admitting: Podiatry

## 2020-05-17 DIAGNOSIS — I10 Essential (primary) hypertension: Secondary | ICD-10-CM | POA: Diagnosis not present

## 2020-05-17 DIAGNOSIS — E1151 Type 2 diabetes mellitus with diabetic peripheral angiopathy without gangrene: Secondary | ICD-10-CM | POA: Diagnosis not present

## 2020-05-17 DIAGNOSIS — E785 Hyperlipidemia, unspecified: Secondary | ICD-10-CM | POA: Diagnosis not present

## 2020-05-17 DIAGNOSIS — E039 Hypothyroidism, unspecified: Secondary | ICD-10-CM | POA: Diagnosis not present

## 2020-05-18 DIAGNOSIS — E039 Hypothyroidism, unspecified: Secondary | ICD-10-CM | POA: Diagnosis not present

## 2020-05-18 DIAGNOSIS — E1151 Type 2 diabetes mellitus with diabetic peripheral angiopathy without gangrene: Secondary | ICD-10-CM | POA: Diagnosis not present

## 2020-05-18 DIAGNOSIS — I10 Essential (primary) hypertension: Secondary | ICD-10-CM | POA: Diagnosis not present

## 2020-05-18 DIAGNOSIS — E785 Hyperlipidemia, unspecified: Secondary | ICD-10-CM | POA: Diagnosis not present

## 2020-05-23 DIAGNOSIS — E039 Hypothyroidism, unspecified: Secondary | ICD-10-CM | POA: Diagnosis not present

## 2020-05-23 DIAGNOSIS — E785 Hyperlipidemia, unspecified: Secondary | ICD-10-CM | POA: Diagnosis not present

## 2020-05-23 DIAGNOSIS — Z7982 Long term (current) use of aspirin: Secondary | ICD-10-CM | POA: Diagnosis not present

## 2020-05-23 DIAGNOSIS — I10 Essential (primary) hypertension: Secondary | ICD-10-CM | POA: Diagnosis not present

## 2020-05-23 DIAGNOSIS — K219 Gastro-esophageal reflux disease without esophagitis: Secondary | ICD-10-CM | POA: Diagnosis not present

## 2020-05-23 DIAGNOSIS — N1831 Chronic kidney disease, stage 3a: Secondary | ICD-10-CM | POA: Diagnosis not present

## 2020-05-23 DIAGNOSIS — E1151 Type 2 diabetes mellitus with diabetic peripheral angiopathy without gangrene: Secondary | ICD-10-CM | POA: Diagnosis not present

## 2020-05-23 DIAGNOSIS — Z0000 Encounter for general adult medical examination without abnormal findings: Secondary | ICD-10-CM | POA: Diagnosis not present

## 2020-06-12 DIAGNOSIS — M1180 Other specified crystal arthropathies, unspecified site: Secondary | ICD-10-CM | POA: Diagnosis not present

## 2020-06-12 DIAGNOSIS — M109 Gout, unspecified: Secondary | ICD-10-CM | POA: Diagnosis not present

## 2020-06-12 DIAGNOSIS — M1990 Unspecified osteoarthritis, unspecified site: Secondary | ICD-10-CM | POA: Diagnosis not present

## 2020-06-12 DIAGNOSIS — N1831 Chronic kidney disease, stage 3a: Secondary | ICD-10-CM | POA: Diagnosis not present

## 2020-06-12 DIAGNOSIS — Z79899 Other long term (current) drug therapy: Secondary | ICD-10-CM | POA: Diagnosis not present

## 2020-06-28 ENCOUNTER — Ambulatory Visit: Payer: Medicare Other | Admitting: Podiatry

## 2020-07-03 ENCOUNTER — Encounter: Payer: Self-pay | Admitting: Podiatry

## 2020-07-03 ENCOUNTER — Other Ambulatory Visit: Payer: Self-pay

## 2020-07-03 ENCOUNTER — Ambulatory Visit (INDEPENDENT_AMBULATORY_CARE_PROVIDER_SITE_OTHER): Payer: PRIVATE HEALTH INSURANCE | Admitting: Podiatry

## 2020-07-03 DIAGNOSIS — B351 Tinea unguium: Secondary | ICD-10-CM

## 2020-07-03 DIAGNOSIS — E119 Type 2 diabetes mellitus without complications: Secondary | ICD-10-CM

## 2020-07-03 DIAGNOSIS — L89892 Pressure ulcer of other site, stage 2: Secondary | ICD-10-CM | POA: Diagnosis not present

## 2020-07-03 DIAGNOSIS — L84 Corns and callosities: Secondary | ICD-10-CM

## 2020-07-03 DIAGNOSIS — M79676 Pain in unspecified toe(s): Secondary | ICD-10-CM | POA: Diagnosis not present

## 2020-07-03 NOTE — Patient Instructions (Signed)
DRESSING CHANGES R 3rd toe:   1. KEEP R 3rd toe DRY AT ALL TIMES!!!!  2. APPLY A LIGHT AMOUNT OF triple antibiotic ointment TO RIGHT 3RD TOE.  3. APPLY FABRIC BAND-AID  4. APPLY SILICONE TOE PAD TO RIGHT 3RD TOE  5.  IF YOU EXPERIENCE ANY FEVER, CHILLS, NIGHTSWEATS, NAUSEA OR VOMITING, ELEVATED OR LOW BLOOD SUGARS, REPORT TO EMERGENCY ROOM.  6. IF YOU EXPERIENCE INCREASED REDNESS, PAIN, SWELLING, DISCOLORATION, ODOR, PUS, DRAINAGE OR WARMTH OF YOUR FOOT, REPORT TO EMERGENCY ROOM.

## 2020-07-04 ENCOUNTER — Telehealth: Payer: Self-pay | Admitting: *Deleted

## 2020-07-04 NOTE — Telephone Encounter (Signed)
Once daily. However, if she refuses at least every other day.

## 2020-07-04 NOTE — Telephone Encounter (Signed)
Patient's daughter is wanting to know how frequently should the  bandage,toe coverings be changed on patient's toe.

## 2020-07-04 NOTE — Telephone Encounter (Signed)
Called and informed patient's daughter per Dr Eloy End to change bandages daily, if patient will not allow,every other day,verbalized understanding of the recommendations.

## 2020-07-06 NOTE — Progress Notes (Signed)
Subjective: Jessica Hawkins is a pleasant 87 y.o. female patient seen today preventative diabetic foot care and painful corn(s) right foot and callus(es) right foot and painful mycotic nails.  Pain is relieved with periodic professional debridement.  Jessica Hawkins likes Jessica new diabetic shoes.   Jessica daughter, Jessica Hawkins is present during today's visit.  Jessica Hawkins is concerned about Jessica Hawkins about painful right 3rd toe. She states Jessica Hawkins often refuses to let caretakers placed toe separators between right 3rd and 4th digits. She states Jessica Hawkins has pain when these two toes are touched. Denies any redness, swelling, drainage or odor.  Dr. Merri Brunette is Jessica PCP. Last visit was 12/15/2019.   Allergies  Allergen Reactions  . No Known Allergies     Objective: Physical Exam  General: Jessica Hawkins is a pleasant 87 y.o. African American female, WD, WN in NAD. AAO x 3.   Vascular:  Capillary fill time to digits <3 seconds b/l lower extremities. Palpable pedal pulses b/l LE. Pedal hair sparse. Lower extremity skin temperature gradient within normal limits. No pain with calf compression b/l. No ischemia or gangrene noted b/l lower extremities.  Dermatological:  Pedal skin with normal turgor, texture and tone bilaterally. No interdigital macerations bilaterally. Toenails 1-5 b/l elongated, discolored, dystrophic, thickened, crumbly with subungual debris and tenderness to dorsal palpation. Hyperkeratotic lesion(s) distal tip b/l 3rd toes and distal tip left hallux.  No erythema, no edema, no drainage, no fluctuance, no blistering.   Right 3rd digit lateral PIPJ has a lifted scab and stage 2 pressure injury. Base is granular and depth is planar. There is tenderness to palpation. No edema, no drainage, no odor.   Musculoskeletal:  Normal muscle strength 5/5 to all lower extremity muscle groups bilaterally. No pain crepitus or joint limitation noted with ROM b/l. Hammertoes noted to the 2-5 bilaterally.  Jessica toes are tightly clenched.  Neurological:  Protective sensation intact 5/5 intact bilaterally with 10g monofilament b/l. Vibratory sensation intact b/l. Proprioception intact bilaterally. Clonus negative b/l.  Assessment and Plan:  1. Pain due to onychomycosis of toenail   2. Pressure injury of toe of right foot, stage 2 (HCC)   3. Corns and callosities   4. Controlled type 2 diabetes mellitus without complication, without long-term current use of insulin (HCC)     -Examined patient. -Cleansed pressure ulcer right 2nd digit. Applied triple antibiotic ointment and dot fabric band-aid. Daughter educated on daily application of triple antibiotic ointment and dot band-aid. May continue toe pads over band-aid daily. If condition worsens, they are to schedule appointment with Dr. Logan Bores. -Daughter given written instructions on daily dressing changes.  -For pressure ulcers of right 3rd digit, goal is to keep pressure off of toes. Use foam toe separators to keep pressure off of digit. Educated daughter Jessica Hawkins and caregiver on signs of infection: redness, drainage, swelling of digit, pain. They were instructed to call office if any concerns arise.  -Toenails 1-5 b/l were debrided in length and girth with sterile nail nippers and dremel without iatrogenic bleeding.  -Corn(s) b/l  3rd toes and callus(es) distal tip left hallux were pared utilizing sterile scalpel blade without incident. Total number debrided =3. -Patient to report any pedal injuries to medical professional immediately. -Daughter, Jessica Hawkins, is to call me or contact me through Mychart should there be question/concern in the interim.  Return in about 10 weeks (around 09/11/2020).  Freddie Breech, DPM

## 2020-07-12 DIAGNOSIS — R32 Unspecified urinary incontinence: Secondary | ICD-10-CM | POA: Diagnosis not present

## 2020-07-12 DIAGNOSIS — Z4689 Encounter for fitting and adjustment of other specified devices: Secondary | ICD-10-CM | POA: Diagnosis not present

## 2020-08-14 ENCOUNTER — Ambulatory Visit: Payer: Medicare Other | Admitting: Podiatry

## 2020-08-15 ENCOUNTER — Encounter: Payer: Self-pay | Admitting: Hematology

## 2020-08-16 DIAGNOSIS — N8111 Cystocele, midline: Secondary | ICD-10-CM | POA: Diagnosis not present

## 2020-08-29 ENCOUNTER — Ambulatory Visit: Payer: Medicare Other | Admitting: Podiatry

## 2020-09-11 ENCOUNTER — Ambulatory Visit (INDEPENDENT_AMBULATORY_CARE_PROVIDER_SITE_OTHER): Payer: PRIVATE HEALTH INSURANCE | Admitting: Podiatry

## 2020-09-11 ENCOUNTER — Encounter: Payer: Self-pay | Admitting: Hematology

## 2020-09-11 ENCOUNTER — Ambulatory Visit: Payer: Medicare Other | Admitting: Podiatry

## 2020-09-11 ENCOUNTER — Other Ambulatory Visit: Payer: Self-pay

## 2020-09-11 DIAGNOSIS — M2041 Other hammer toe(s) (acquired), right foot: Secondary | ICD-10-CM

## 2020-09-11 DIAGNOSIS — M79676 Pain in unspecified toe(s): Secondary | ICD-10-CM

## 2020-09-11 DIAGNOSIS — M2042 Other hammer toe(s) (acquired), left foot: Secondary | ICD-10-CM

## 2020-09-11 DIAGNOSIS — L89891 Pressure ulcer of other site, stage 1: Secondary | ICD-10-CM | POA: Diagnosis not present

## 2020-09-11 DIAGNOSIS — E119 Type 2 diabetes mellitus without complications: Secondary | ICD-10-CM

## 2020-09-11 DIAGNOSIS — L84 Corns and callosities: Secondary | ICD-10-CM | POA: Diagnosis not present

## 2020-09-11 DIAGNOSIS — B351 Tinea unguium: Secondary | ICD-10-CM

## 2020-09-11 NOTE — Progress Notes (Signed)
Patient presented for foam casting for 3 pair custom diabetic shoe inserts. Patient is measured with a brannock device to be a size 11 x-wide  Diabetic shoes are chosen from the [employer] catalog. The shoes chosen are [id number]  The patient will be contacted when the shoes and inserts are ready to be picked up

## 2020-09-12 DIAGNOSIS — R300 Dysuria: Secondary | ICD-10-CM | POA: Diagnosis not present

## 2020-09-12 DIAGNOSIS — R350 Frequency of micturition: Secondary | ICD-10-CM | POA: Diagnosis not present

## 2020-09-12 DIAGNOSIS — N390 Urinary tract infection, site not specified: Secondary | ICD-10-CM | POA: Diagnosis not present

## 2020-09-12 DIAGNOSIS — Z4689 Encounter for fitting and adjustment of other specified devices: Secondary | ICD-10-CM | POA: Diagnosis not present

## 2020-09-12 DIAGNOSIS — N939 Abnormal uterine and vaginal bleeding, unspecified: Secondary | ICD-10-CM | POA: Diagnosis not present

## 2020-09-14 ENCOUNTER — Encounter: Payer: Self-pay | Admitting: Podiatry

## 2020-09-14 NOTE — Progress Notes (Signed)
Subjective: Jessica Hawkins is a pleasant 87 y.o. female patient seen today for preventative diabetic foot care. Patient has h/o interdigital pressure sores that are manageable with padding. She also has painful thick toenails that are difficult to trim. Pain interferes with ambulation. Aggravating factors include wearing enclosed shoe gear. Pain is relieved with periodic professional debridement.  Her daughter, Windell Moulding, is present during today's visit. She is interested in obtaining an additional pair of diabetic shoes for her Mom. Windell Moulding also states Mom's right 3rd and 4th toes are tender.  PCP is Merri Brunette, MD. Last visit was: 12/15/2019.  Allergies  Allergen Reactions   No Known Allergies     Objective: Physical Exam  General: Jessica Hawkins is a pleasant 87 y.o. African American female, in NAD. AAO x 3.   Vascular:  Capillary fill time to digits <3 seconds b/l lower extremities. Palpable DP pulse(s) b/l lower extremities Palpable PT pulse(s) b/l lower extremities Pedal hair absent. Lower extremity skin temperature gradient within normal limits. No ischemia or gangrene noted b/l lower extremities.  Dermatological:  Pedal skin with normal turgor, texture and tone b/l lower extremities No interdigital macerations b/l lower extremities Toenails 1-5 b/l elongated, discolored, dystrophic, thickened, crumbly with subungual debris and tenderness to dorsal palpation. Stage 1 pressure sores noted interdigitally lateral aspect right 3rd digit and medial aspect of right 4th digit. No erythema, no edema, no active drainage, no flucutance. Flaky, loose, dried scab noted lateral aspet right 3rd digit. Hyperkeratotic lesion(s) distal tip b/l 3rd toes and distal tip left hallux.  No erythema, no edema, no drainage, no fluctuance.  Musculoskeletal:  Normal muscle strength 5/5 to all lower extremity muscle groups bilaterally. Severe hammertoe deformity noted 2-5 bilaterally. Utilizes wheelchair for  mobility assistance.  Neurological:  Protective sensation intact 5/5 intact bilaterally with 10g monofilament b/l. Vibratory sensation intact b/l.  Assessment and Plan:  1. Pain due to onychomycosis of toenail   2. Corns and callosities   3. Pressure injury of toe of right foot, stage 1   4. Controlled type 2 diabetes mellitus without complication, without long-term current use of insulin (HCC)     -Examined patient. -Right 3rd and 4th digits cleansed with alcohol. Keep each toe protected with band-aid to prevent pressure. May use cotton between toes Call office should either toe become red, swollen, get worse or start draining. If toes get worse, schedule an appointment with Dr. Gala Lewandowsky . -Patient to continue soft, supportive shoe gear daily. -Toenails 1-5 b/l were debrided in length and girth with sterile nail nippers and dremel without iatrogenic bleeding.  -Corn(s) L 3rd toe and R 3rd toe and callus(es) L hallux were pared utilizing sterile scalpel blade without incident. Total number debrided =3. -Patient to report any pedal injuries to medical professional immediately. -Explained to daughter we cannot charge cash for shoes when Ms. Randle hasn't used her benefits for her shoes this year. She related understanding. A cast of her feet were taken and shoe choice was made. Details noted in separate note on this date. -Patient/POA to call should there be question/concern in the interim.  Return in about 9 weeks (around 11/13/2020).  Freddie Breech, DPM

## 2020-09-18 DIAGNOSIS — N939 Abnormal uterine and vaginal bleeding, unspecified: Secondary | ICD-10-CM | POA: Diagnosis not present

## 2020-09-18 DIAGNOSIS — R309 Painful micturition, unspecified: Secondary | ICD-10-CM | POA: Diagnosis not present

## 2020-09-20 DIAGNOSIS — R350 Frequency of micturition: Secondary | ICD-10-CM | POA: Diagnosis not present

## 2020-09-20 DIAGNOSIS — R451 Restlessness and agitation: Secondary | ICD-10-CM | POA: Diagnosis not present

## 2020-09-20 DIAGNOSIS — R600 Localized edema: Secondary | ICD-10-CM | POA: Diagnosis not present

## 2020-09-20 DIAGNOSIS — T501X5A Adverse effect of loop [high-ceiling] diuretics, initial encounter: Secondary | ICD-10-CM | POA: Diagnosis not present

## 2020-09-25 ENCOUNTER — Encounter: Payer: Self-pay | Admitting: Hematology

## 2020-09-27 DIAGNOSIS — R7309 Other abnormal glucose: Secondary | ICD-10-CM | POA: Diagnosis not present

## 2020-10-10 DIAGNOSIS — E1151 Type 2 diabetes mellitus with diabetic peripheral angiopathy without gangrene: Secondary | ICD-10-CM | POA: Diagnosis not present

## 2020-10-12 ENCOUNTER — Telehealth: Payer: Self-pay | Admitting: Podiatry

## 2020-10-12 NOTE — Telephone Encounter (Signed)
Pts daughter called upset because when pt was in to be measured for diabetic shoes and daughter was with her she specifically asked if they needed documents from Dr Renne Crigler again and she was told no. Last year the daughter took them to the Dr to have them filled out to speed up the process. I apologized for her being given the wrong information and explained that it is a medicare policy that has to be done every year when pt get shoes. She said we need to make sure the people working are educated.  She then called back and asked if the shoes were ordered and how long would it take once we get the documents. I told her the shoes are ordered but they do not make the inserts until we get the documents and after that it takes 2 to 3 wks , she was worried it would be 6 wks.

## 2020-11-09 DIAGNOSIS — Z79899 Other long term (current) drug therapy: Secondary | ICD-10-CM | POA: Diagnosis not present

## 2020-11-09 DIAGNOSIS — M1990 Unspecified osteoarthritis, unspecified site: Secondary | ICD-10-CM | POA: Diagnosis not present

## 2020-11-09 DIAGNOSIS — M1180 Other specified crystal arthropathies, unspecified site: Secondary | ICD-10-CM | POA: Diagnosis not present

## 2020-11-09 DIAGNOSIS — N1831 Chronic kidney disease, stage 3a: Secondary | ICD-10-CM | POA: Diagnosis not present

## 2020-11-09 DIAGNOSIS — M109 Gout, unspecified: Secondary | ICD-10-CM | POA: Diagnosis not present

## 2020-11-15 ENCOUNTER — Other Ambulatory Visit: Payer: Self-pay

## 2020-11-15 ENCOUNTER — Encounter: Payer: Self-pay | Admitting: Podiatry

## 2020-11-15 ENCOUNTER — Encounter: Payer: Self-pay | Admitting: Hematology

## 2020-11-15 ENCOUNTER — Ambulatory Visit (INDEPENDENT_AMBULATORY_CARE_PROVIDER_SITE_OTHER): Payer: Medicare HMO | Admitting: Podiatry

## 2020-11-15 DIAGNOSIS — E119 Type 2 diabetes mellitus without complications: Secondary | ICD-10-CM

## 2020-11-15 DIAGNOSIS — M79676 Pain in unspecified toe(s): Secondary | ICD-10-CM

## 2020-11-15 DIAGNOSIS — L84 Corns and callosities: Secondary | ICD-10-CM | POA: Diagnosis not present

## 2020-11-15 DIAGNOSIS — L89892 Pressure ulcer of other site, stage 2: Secondary | ICD-10-CM

## 2020-11-15 DIAGNOSIS — R451 Restlessness and agitation: Secondary | ICD-10-CM | POA: Insufficient documentation

## 2020-11-15 DIAGNOSIS — B351 Tinea unguium: Secondary | ICD-10-CM

## 2020-11-15 NOTE — Patient Instructions (Signed)
  MS. Wentz, PLEASE ALLOW CAREGIVERS TO APPLY BAND-AIDS TO TOES TO REDUCE PRESSURE ON YOUR TOES. THIS MUST BE DONE EVERYDAY.  IF TOE WORSENS, SCHEDULE APPOINTMENT TO SEE DR. BRENT EVANS.  DRESSING CHANGES RIGHT 3RD TOE:   PHARMACY SHOPPING LIST: Saline or Wound Cleanser for cleaning wound 2 x 2 inch sterile gauze for cleaning wound Neosporin with Pain Relief   1. KEEP R 3rd toe DRY AT ALL TIMES!!!!  2. CLEANSE RIGHT 3RD TOE WITH SALINE OR WOUND CLEANSER.  3. DAB DRY WITH GAUZE SPONGE.  4. APPLY A LIGHT AMOUNT OF  NEOSPORIN WITH PAIN RELIEF  TO BASE OF ULCER.  5. APPLY FABRIC DOT BAND-AID AS INSTRUCTED.  6. IF YOU EXPERIENCE ANY FEVER, CHILLS, NIGHTSWEATS, NAUSEA OR VOMITING, ELEVATED OR LOW BLOOD SUGARS, REPORT TO EMERGENCY ROOM.  7. IF YOU EXPERIENCE INCREASED REDNESS, PAIN, SWELLING, DISCOLORATION, ODOR, PUS, DRAINAGE OR WARMTH OF YOUR FOOT, REPORT TO EMERGENCY ROOM.

## 2020-11-20 DIAGNOSIS — E785 Hyperlipidemia, unspecified: Secondary | ICD-10-CM | POA: Diagnosis not present

## 2020-11-20 DIAGNOSIS — E1151 Type 2 diabetes mellitus with diabetic peripheral angiopathy without gangrene: Secondary | ICD-10-CM | POA: Diagnosis not present

## 2020-11-20 DIAGNOSIS — I10 Essential (primary) hypertension: Secondary | ICD-10-CM | POA: Diagnosis not present

## 2020-11-20 NOTE — Progress Notes (Signed)
Subjective: Jessica Hawkins is a pleasant 87 y.o. female patient seen today for at-risk diabetic foot care. She has interdigital pressure ulcers right 3rd and right 4th digits due to chronic toe clenching.  Daughter, Rinaldo Cloud, is present during today's visit. Rinaldo Cloud states [name] at times does not allow caregivers to place padding between toes nor apply dressing to affected toes. They do not relate any redness, drainage or swelling of digits today.  Daughter Rinaldo Cloud states they have been applying Eumiaid to toes on right foot.  [name] states she will comply with whatever instructions we give her for her toes.  Patient also has painful thick toenails that are difficult to trim. Aggravating factors include wearing enclosed shoe gear. Pain is relieved with periodic professional debridement.  PCP is Merri Brunette, MD. Last visit was: 10/10/2020  Allergies  Allergen Reactions   No Known Allergies     Objective: Physical Exam  General: Jessica Hawkins is a pleasant 87 y.o. African American female, in NAD. AAO x 3.   Vascular:  Capillary fill time to digits <3 seconds b/l lower extremities. Palpable DP pulse(s) b/l lower extremities Palpable PT pulse(s) b/l lower extremities Pedal hair absent. Lower extremity skin temperature gradient within normal limits. No ischemia or gangrene noted b/l lower extremities.  Dermatological:  Pedal skin with normal turgor, texture and tone b/l lower extremities No interdigital macerations b/l lower extremities Toenails 1-5 b/l elongated, discolored, dystrophic, thickened, crumbly with subungual debris and tenderness to dorsal palpation. Stage 2 pressure sore noted interdigitally lateral aspect right 3rd digit PIPJ, annular in shape. Measures 0.3 x 0.3 x 0.1 cm with pink base.  No erythema, no edema, no active drainage, no fluctuance. No odor. Hyperkeratotic lesion(s) distal tip b/l 3rd toes and distal tip left hallux.  No erythema, no edema, no drainage, no  fluctuance.  Musculoskeletal:  Normal muscle strength 5/5 to all lower extremity muscle groups bilaterally. Severe hammertoe deformity noted 2-5 bilaterally. Utilizes wheelchair for mobility assistance.  Neurological:  Protective sensation intact 5/5 intact bilaterally with 10g monofilament b/l. Vibratory sensation intact b/l.  Assessment and Plan:  1. Pressure injury of toe of right foot, stage 2 (HCC)   2. Pain due to onychomycosis of toenail   3. Corns and callosities   4. Diet-controlled diabetes mellitus (HCC)   -Examined patient. -Right 3rd  cleansed with wound cleanser. Triple antibiotic ointment and light dressing applied.Marland Kitchen Keep each toe protected with band-aid to prevent pressure. Call office should either toe become red, swollen, get worse or start draining. If toes get worse, schedule an appointment with Dr. Gala Lewandowsky . -Patient to continue soft, supportive shoe gear daily. -Toenails 1-5 b/l were debrided in length and girth with sterile nail nippers and dremel without iatrogenic bleeding.  -Corn(s) L 3rd toe and R 3rd toe and callus(es) L hallux were pared utilizing sterile scalpel blade without incident. Total number debrided =3. -Instructions printed instructing Ms. Cooperman to allow caregivers to apply band-aids to toes to reduce pressure.  -Discontinue Emuaid Ointment. Instructions for dressing changes right 3rd toe dispensed today. -Patient to report any pedal injuries to medical professional immediately. -Patient/POA to call should there be question/concern in the interim.  Return in about 9 weeks (around 01/17/2021).  Freddie Breech, DPM

## 2020-11-23 ENCOUNTER — Telehealth: Payer: Self-pay | Admitting: Podiatry

## 2020-11-23 DIAGNOSIS — E1151 Type 2 diabetes mellitus with diabetic peripheral angiopathy without gangrene: Secondary | ICD-10-CM | POA: Diagnosis not present

## 2020-11-23 DIAGNOSIS — I10 Essential (primary) hypertension: Secondary | ICD-10-CM | POA: Diagnosis not present

## 2020-11-23 NOTE — Telephone Encounter (Signed)
Pts daughter(Ruth Yetta Barre) called checking on status of diabetic shoes and upon checking it looks like they shipped but not to the right location. I have messaged the company to check on it and told pt daughter I would call when I get more information.

## 2020-12-04 ENCOUNTER — Telehealth: Payer: Self-pay | Admitting: Podiatry

## 2020-12-04 NOTE — Telephone Encounter (Signed)
Pts daughter Jessica Hawkins left 2 messages this morning (909am/ 1004 am)wanting information on pts diabetic shoes. She stated she was to get a call back and has not received it.  Upon checking I never received any updated information from the company. It does look like they were releasing but not for sure. I did message [employer] again about it this am.  In the mean time pts daughter comes into the office and was asking about it and I told the front desk girl I was still waiting for an update from the company. She asked if they could cancel it and I told her the inserts were already produced so it is too late to cancel. She asked the front desk if there was a number she could call and I told them there was not a number for [place] to give out.

## 2020-12-06 ENCOUNTER — Telehealth: Payer: Self-pay | Admitting: *Deleted

## 2020-12-06 NOTE — Telephone Encounter (Signed)
Patient's daughter is calling and said that her mother continues to complain of the pressure sore on feet but she wanted to discuss with Dr Eloy End before scheduling with someone else. Please advise.

## 2020-12-08 ENCOUNTER — Telehealth (INDEPENDENT_AMBULATORY_CARE_PROVIDER_SITE_OTHER): Payer: Medicare HMO | Admitting: Podiatry

## 2020-12-08 DIAGNOSIS — E119 Type 2 diabetes mellitus without complications: Secondary | ICD-10-CM

## 2020-12-08 DIAGNOSIS — L89892 Pressure ulcer of other site, stage 2: Secondary | ICD-10-CM

## 2020-12-08 NOTE — Telephone Encounter (Signed)
Phoned patient's daughter, Jessica Hawkins. She would like to know if it is permissible to video her Mom's next visit with [place]. This is to have documentation as reminder cues of what was said during the visit with the doctor, as her Mom has dementia and does not remember what was said during her doctor visits. This video will help aid her family and caregivers when addressing the care plan for Jessica Hawkins toes. Ruthie states Mom's toes are better with band-aids to protect her toes. I informed Ruthie I would present to our office manager to determine [facility]'s policy and we will contact her with an answer.

## 2020-12-08 NOTE — Telephone Encounter (Signed)
I spoke to daughter, Elese Rane, and call is documented. Thanks Ammie!

## 2020-12-11 ENCOUNTER — Telehealth: Payer: Self-pay | Admitting: Podiatry

## 2020-12-11 NOTE — Telephone Encounter (Signed)
Diabetic shoes/inserts in.. lvm for pts daughter to call to discuss picking up the shoes on 10.17 when at appt or I could possibly do 10.13 this week

## 2020-12-18 ENCOUNTER — Other Ambulatory Visit: Payer: Self-pay

## 2020-12-18 ENCOUNTER — Ambulatory Visit: Payer: Medicare HMO | Admitting: Podiatry

## 2020-12-18 DIAGNOSIS — M2041 Other hammer toe(s) (acquired), right foot: Secondary | ICD-10-CM | POA: Diagnosis not present

## 2020-12-18 DIAGNOSIS — E119 Type 2 diabetes mellitus without complications: Secondary | ICD-10-CM | POA: Diagnosis not present

## 2020-12-18 DIAGNOSIS — M2042 Other hammer toe(s) (acquired), left foot: Secondary | ICD-10-CM | POA: Diagnosis not present

## 2020-12-18 DIAGNOSIS — L97511 Non-pressure chronic ulcer of other part of right foot limited to breakdown of skin: Secondary | ICD-10-CM | POA: Diagnosis not present

## 2020-12-18 DIAGNOSIS — L84 Corns and callosities: Secondary | ICD-10-CM | POA: Diagnosis not present
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
## 2020-12-20 ENCOUNTER — Telehealth: Payer: Self-pay | Admitting: Neurology

## 2020-12-20 NOTE — Telephone Encounter (Signed)
Pt's daughter, Fredericka Bottcher (on [place]) called, dementia worsening; becoming more forgetful, unresponsive at times, confused. Would like a call from the nurse to discuss sooner appt.

## 2020-12-20 NOTE — Telephone Encounter (Signed)
When patient's family calls back, please schedule a follow up with Dr. Terrace Arabia or Maralyn Sago.  ___________________________________  We last evaluated this patient on 04/27/2019.  Notes from that appt:  Dementia -Decline in memory score, 19/30 today, seems to be fairly stable, has a good support system with family and home health aides, able to do her ADLs with minimal assistance  -Laboratory evaluation showed no treatable etiology -MRI of the brain showed generalized atrophy, supratentorium small vessel disease -She has refused Aricept or Namenda, still not interested at this time -She will follow up with her PCP going forward, our office as needed. ____________________________________ I returned the call to the patient's dgt/POA, Jessica Hawkins and left a detailed message (ok per DPR). We are happy to see her mother again for worsening memory, if she feels neurology care is needed. However, we will need to get an appt scheduled since it has been so long since her last evaluation.  ___________________________________

## 2020-12-25 DIAGNOSIS — R410 Disorientation, unspecified: Secondary | ICD-10-CM | POA: Diagnosis not present

## 2020-12-25 DIAGNOSIS — F0390 Unspecified dementia without behavioral disturbance: Secondary | ICD-10-CM | POA: Diagnosis not present

## 2020-12-26 DIAGNOSIS — R410 Disorientation, unspecified: Secondary | ICD-10-CM | POA: Diagnosis not present

## 2021-01-17 DIAGNOSIS — L03115 Cellulitis of right lower limb: Secondary | ICD-10-CM | POA: Diagnosis not present

## 2021-01-30 ENCOUNTER — Telehealth (INDEPENDENT_AMBULATORY_CARE_PROVIDER_SITE_OTHER): Payer: Medicare HMO | Admitting: Neurology

## 2021-01-30 ENCOUNTER — Encounter: Payer: Self-pay | Admitting: Neurology

## 2021-01-30 DIAGNOSIS — F02B0 Dementia in other diseases classified elsewhere, moderate, without behavioral disturbance, psychotic disturbance, mood disturbance, and anxiety: Secondary | ICD-10-CM | POA: Diagnosis not present

## 2021-01-30 DIAGNOSIS — G301 Alzheimer's disease with late onset: Secondary | ICD-10-CM
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
## 2021-01-31 ENCOUNTER — Telehealth: Payer: Self-pay | Admitting: *Deleted

## 2021-01-31 DIAGNOSIS — R2241 Localized swelling, mass and lump, right lower limb: Secondary | ICD-10-CM | POA: Diagnosis not present

## 2021-01-31 DIAGNOSIS — L03115 Cellulitis of right lower limb: Secondary | ICD-10-CM | POA: Diagnosis not present

## 2021-01-31 NOTE — Telephone Encounter (Signed)
Patient's daughter(Ruth) is calling because her mother has been having right foot excruciating pain.  Patient has been scheduled for upcoming appointment w/ Dr Logan Bores. 02/07/21@10 :45.

## 2021-02-07 ENCOUNTER — Other Ambulatory Visit: Payer: Self-pay

## 2021-02-07 ENCOUNTER — Ambulatory Visit: Payer: Medicare HMO | Admitting: Podiatry

## 2021-02-07 DIAGNOSIS — M79676 Pain in unspecified toe(s): Secondary | ICD-10-CM | POA: Diagnosis not present

## 2021-02-07 DIAGNOSIS — L97511 Non-pressure chronic ulcer of other part of right foot limited to breakdown of skin: Secondary | ICD-10-CM | POA: Diagnosis not present

## 2021-02-07 DIAGNOSIS — B351 Tinea unguium: Secondary | ICD-10-CM
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
## 2021-02-14 ENCOUNTER — Ambulatory Visit: Payer: Medicare HMO | Admitting: Podiatry

## 2021-03-02 DIAGNOSIS — N1831 Chronic kidney disease, stage 3a: Secondary | ICD-10-CM | POA: Diagnosis not present

## 2021-03-02 DIAGNOSIS — N390 Urinary tract infection, site not specified: Secondary | ICD-10-CM | POA: Diagnosis not present

## 2021-03-02 DIAGNOSIS — E785 Hyperlipidemia, unspecified: Secondary | ICD-10-CM | POA: Diagnosis not present

## 2021-03-02 DIAGNOSIS — I10 Essential (primary) hypertension: Secondary | ICD-10-CM | POA: Diagnosis not present

## 2021-03-02 DIAGNOSIS — E1151 Type 2 diabetes mellitus with diabetic peripheral angiopathy without gangrene: Secondary | ICD-10-CM | POA: Diagnosis not present

## 2021-04-05 ENCOUNTER — Ambulatory Visit: Payer: Medicare HMO | Admitting: Neurology

## 2021-05-02 ENCOUNTER — Encounter: Payer: Self-pay | Admitting: Hematology

## 2021-05-05 DIAGNOSIS — Z03818 Encounter for observation for suspected exposure to other biological agents ruled out: Secondary | ICD-10-CM | POA: Diagnosis not present

## 2021-05-05 DIAGNOSIS — B349 Viral infection, unspecified: Secondary | ICD-10-CM | POA: Diagnosis not present

## 2021-05-05 DIAGNOSIS — J029 Acute pharyngitis, unspecified: Secondary | ICD-10-CM | POA: Diagnosis not present

## 2021-05-05 DIAGNOSIS — J189 Pneumonia, unspecified organism: Secondary | ICD-10-CM | POA: Diagnosis not present

## 2021-05-05 DIAGNOSIS — R059 Cough, unspecified: Secondary | ICD-10-CM | POA: Diagnosis not present

## 2021-05-09 ENCOUNTER — Other Ambulatory Visit: Payer: Self-pay

## 2021-05-09 ENCOUNTER — Ambulatory Visit (INDEPENDENT_AMBULATORY_CARE_PROVIDER_SITE_OTHER): Payer: Medicare PPO | Admitting: Podiatry

## 2021-05-09 ENCOUNTER — Encounter: Payer: Self-pay | Admitting: Podiatry

## 2021-05-09 DIAGNOSIS — M79676 Pain in unspecified toe(s): Secondary | ICD-10-CM

## 2021-05-09 DIAGNOSIS — E1142 Type 2 diabetes mellitus with diabetic polyneuropathy: Secondary | ICD-10-CM | POA: Diagnosis not present

## 2021-05-09 DIAGNOSIS — R1013 Epigastric pain: Secondary | ICD-10-CM | POA: Insufficient documentation

## 2021-05-09 DIAGNOSIS — B351 Tinea unguium: Secondary | ICD-10-CM

## 2021-05-09 DIAGNOSIS — D500 Iron deficiency anemia secondary to blood loss (chronic): Secondary | ICD-10-CM | POA: Insufficient documentation

## 2021-05-09 DIAGNOSIS — R198 Other specified symptoms and signs involving the digestive system and abdomen: Secondary | ICD-10-CM | POA: Insufficient documentation

## 2021-05-09 DIAGNOSIS — R109 Unspecified abdominal pain: Secondary | ICD-10-CM | POA: Insufficient documentation

## 2021-05-09 MED ORDER — SILVER SULFADIAZINE 1 % EX CREA: TOPICAL_CREAM | CUTANEOUS | 1 refills | Status: AC

## 2021-05-09 NOTE — Patient Instructions (Signed)
Discontinue diabetic socks. ? ?Start wearing thin socks daily. ? ?Apply Silvadene Cream to any wounds once daily as needed. ?

## 2021-05-10 DIAGNOSIS — M1990 Unspecified osteoarthritis, unspecified site: Secondary | ICD-10-CM | POA: Diagnosis not present

## 2021-05-10 DIAGNOSIS — Z79899 Other long term (current) drug therapy: Secondary | ICD-10-CM | POA: Diagnosis not present

## 2021-05-10 DIAGNOSIS — M1180 Other specified crystal arthropathies, unspecified site: Secondary | ICD-10-CM | POA: Diagnosis not present

## 2021-05-10 DIAGNOSIS — N1831 Chronic kidney disease, stage 3a: Secondary | ICD-10-CM | POA: Diagnosis not present

## 2021-05-10 DIAGNOSIS — M109 Gout, unspecified: Secondary | ICD-10-CM | POA: Diagnosis not present

## 2021-05-16 NOTE — Progress Notes (Signed)
?  Subjective:  ?Patient ID: Jessica Hawkins, female    DOB: Nov 30, 1922,  MRN: [id number] ? ?Jessica Hawkins presents to clinic today for at risk foot care with history of diabetic neuropathy and painful elongated mycotic toenails 1-5 bilaterally which are tender when wearing enclosed shoe gear. Pain is relieved with periodic professional debridement. ? ?Patient states blood glucose was 113 mg/dl today.   ? ?Patient is accompanied by her daughter, Jessica Hawkins, and caregiver, Jessica Hawkins, on today's visit. Jessica Hawkins has been monitoring her feet daily. Silicone toe spacers were found to cause more discomfort for [name], and those have been discontinued. Family also feels diabetic socks may be too thick and causing her feet to sweat and would like her to wear thinner socks. They are also requesting a refill on Silvadene Cream for any interdigital lesions that may appear. All wounds are healed presently. ? ?PCP is Jessica Pretty, MD , and last visit was March 02, 2021. ? ?Allergies  ?Allergen Reactions  ? Penicillins   ?  Other reaction(s): yeast infection  ? ? ?Review of Systems: Negative except as noted in the HPI. ? ?Objective: No changes noted in today's physical examination. ?General: Jessica Hawkins is a pleasant 88 y.o. African American female, in NAD. AAO x 3.  ? ?Vascular:  ?Capillary fill time to digits <3 seconds b/l lower extremities. Palpable DP pulse(s) b/l lower extremities. Palpable PT pulse(s) b/l lower extremities Pedal hair absent. Trace edema BLE. Lower extremity skin temperature gradient within normal limits. No ischemia or gangrene noted b/l lower extremities. ? ?Dermatological:  ?Pedal skin thin, shiny and atrophic b/l lower extremities.  No interdigital macerations b/l lower extremities Toenails 1-5 b/l elongated, discolored, dystrophic, thickened, crumbly with subungual debris and tenderness to dorsal palpation. No open wounds. No interdigital macerations b/l.  ? ?Musculoskeletal:  ?Normal muscle strength 5/5  to all lower extremity muscle groups bilaterally. Severe hammertoe deformity noted 2-5 bilaterally. Utilizes wheelchair for mobility assistance. ? ?Neurological:  ?Protective sensation intact 5/5 intact bilaterally with 10g monofilament b/l. Vibratory sensation intact b/l. Patient has subjective symptoms of neuropathy. ? ?Assessment/Plan: ?1. Pain due to onychomycosis of toenail   ?2. Diabetic peripheral neuropathy associated with type 2 diabetes mellitus ([place])   ?-Patient was evaluated and treated. All patient's and/or POA's questions/concerns answered on today's visit. ?-Patient to discontinue diabetic socks. She is to start wearing thin socks daily. Refilled Silvadene Cream to apply to any interdigital wounds once daily as needed. ?-Mycotic toenails 1-5 bilaterally were debrided in length and girth with sterile nail nippers and dremel without incident. ?-Patient/POA to call should there be question/concern in the interim.  ? ?Return in about 9 weeks (around 07/11/2021). ? ?Marzetta Board, DPM  ?

## 2021-05-23 DIAGNOSIS — N952 Postmenopausal atrophic vaginitis: Secondary | ICD-10-CM | POA: Diagnosis not present

## 2021-05-24 DIAGNOSIS — Z0000 Encounter for general adult medical examination without abnormal findings: Secondary | ICD-10-CM | POA: Diagnosis not present

## 2021-05-24 DIAGNOSIS — N390 Urinary tract infection, site not specified: Secondary | ICD-10-CM | POA: Diagnosis not present

## 2021-05-24 DIAGNOSIS — E039 Hypothyroidism, unspecified: Secondary | ICD-10-CM | POA: Diagnosis not present

## 2021-05-24 DIAGNOSIS — E785 Hyperlipidemia, unspecified: Secondary | ICD-10-CM | POA: Diagnosis not present

## 2021-05-24 DIAGNOSIS — E1151 Type 2 diabetes mellitus with diabetic peripheral angiopathy without gangrene: Secondary | ICD-10-CM | POA: Diagnosis not present

## 2021-05-28 DIAGNOSIS — J189 Pneumonia, unspecified organism: Secondary | ICD-10-CM | POA: Diagnosis not present

## 2021-05-28 DIAGNOSIS — N1831 Chronic kidney disease, stage 3a: Secondary | ICD-10-CM | POA: Diagnosis not present

## 2021-05-28 DIAGNOSIS — Z0000 Encounter for general adult medical examination without abnormal findings: Secondary | ICD-10-CM | POA: Diagnosis not present

## 2021-05-28 DIAGNOSIS — I10 Essential (primary) hypertension: Secondary | ICD-10-CM | POA: Diagnosis not present

## 2021-05-28 DIAGNOSIS — K921 Melena: Secondary | ICD-10-CM | POA: Diagnosis not present

## 2021-05-28 DIAGNOSIS — Z23 Encounter for immunization: Secondary | ICD-10-CM | POA: Diagnosis not present

## 2021-05-28 DIAGNOSIS — E039 Hypothyroidism, unspecified: Secondary | ICD-10-CM | POA: Diagnosis not present

## 2021-05-28 DIAGNOSIS — I872 Venous insufficiency (chronic) (peripheral): Secondary | ICD-10-CM | POA: Diagnosis not present

## 2021-05-28 DIAGNOSIS — D631 Anemia in chronic kidney disease: Secondary | ICD-10-CM | POA: Diagnosis not present

## 2021-05-28 DIAGNOSIS — M1990 Unspecified osteoarthritis, unspecified site: Secondary | ICD-10-CM | POA: Diagnosis not present

## 2021-05-28 DIAGNOSIS — R799 Abnormal finding of blood chemistry, unspecified: Secondary | ICD-10-CM | POA: Diagnosis not present

## 2021-06-01 DIAGNOSIS — K921 Melena: Secondary | ICD-10-CM | POA: Diagnosis not present

## 2021-06-01 DIAGNOSIS — R198 Other specified symptoms and signs involving the digestive system and abdomen: Secondary | ICD-10-CM | POA: Diagnosis not present

## 2021-06-17 DIAGNOSIS — S0101XA Laceration without foreign body of scalp, initial encounter: Secondary | ICD-10-CM | POA: Diagnosis not present

## 2021-06-17 DIAGNOSIS — S0990XA Unspecified injury of head, initial encounter: Secondary | ICD-10-CM | POA: Diagnosis not present

## 2021-07-11 ENCOUNTER — Ambulatory Visit: Payer: Medicare PPO | Admitting: Podiatry

## 2021-07-11 DIAGNOSIS — M2041 Other hammer toe(s) (acquired), right foot: Secondary | ICD-10-CM

## 2021-07-11 DIAGNOSIS — M2042 Other hammer toe(s) (acquired), left foot: Secondary | ICD-10-CM | POA: Diagnosis not present

## 2021-07-11 DIAGNOSIS — M2012 Hallux valgus (acquired), left foot: Secondary | ICD-10-CM | POA: Diagnosis not present

## 2021-07-11 DIAGNOSIS — M79676 Pain in unspecified toe(s): Secondary | ICD-10-CM

## 2021-07-11 DIAGNOSIS — B351 Tinea unguium: Secondary | ICD-10-CM

## 2021-07-11 DIAGNOSIS — M2011 Hallux valgus (acquired), right foot: Secondary | ICD-10-CM | POA: Diagnosis not present

## 2021-07-11 DIAGNOSIS — E1142 Type 2 diabetes mellitus with diabetic polyneuropathy: Secondary | ICD-10-CM | POA: Diagnosis not present

## 2021-07-11 DIAGNOSIS — E119 Type 2 diabetes mellitus without complications: Secondary | ICD-10-CM | POA: Diagnosis not present

## 2021-07-11 DIAGNOSIS — K921 Melena: Secondary | ICD-10-CM | POA: Insufficient documentation

## 2021-07-11 NOTE — Patient Instructions (Signed)
Ms. Twersky, please wear your compression hose everyday. Apply them in the morning. Remove them before bedtime. ? ?Your feet look great! ? ?Have a Happy Mother's Day! ? ?It was my pleasure serving you today! You had your annual diabetic foot examination performed today. You will notice a charge for an office visit on today's billing and this is for your annual diabetic foot exam. It is done once yearly.  ?

## 2021-07-21 ENCOUNTER — Encounter: Payer: Self-pay | Admitting: Podiatry
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
## 2021-08-24 DIAGNOSIS — M545 Low back pain, unspecified: Secondary | ICD-10-CM | POA: Diagnosis not present

## 2021-09-12 ENCOUNTER — Ambulatory Visit: Payer: Medicare PPO | Admitting: Podiatry

## 2021-09-12 ENCOUNTER — Encounter: Payer: Self-pay | Admitting: Podiatry

## 2021-09-12 DIAGNOSIS — B351 Tinea unguium: Secondary | ICD-10-CM | POA: Diagnosis not present

## 2021-09-12 DIAGNOSIS — M79676 Pain in unspecified toe(s): Secondary | ICD-10-CM | POA: Diagnosis not present

## 2021-09-12 DIAGNOSIS — E1142 Type 2 diabetes mellitus with diabetic polyneuropathy: Secondary | ICD-10-CM | POA: Diagnosis not present

## 2021-09-16 NOTE — Progress Notes (Signed)
  Subjective:  Patient ID: Jessica Hawkins, female    DOB: [birth year],  MRN: [id number]  Jessica Hawkins presents to clinic today for at risk foot care with history of diabetic neuropathy and painful elongated mycotic toenails 1-5 bilaterally which are tender when wearing enclosed shoe gear. Pain is relieved with periodic professional debridement.  Patient states blood glucose was 101 mg/dl today.  Last A1c was in the 5% range.  Jessica Hawkins is accompanied by her caregiver, Jessica Hawkins, on today's visit.  New problem(s): None.   PCP is Merri Brunette, MD , and last visit was  March 02, 2021  Allergies  Allergen Reactions   Penicillins     Other reaction(s): yeast infection    Review of Systems: Negative except as noted in the HPI.  Objective:  KINSEY KARCH is a pleasant 88 y.o. female in NAD. AAO X 3.  Vascular Examination: CFT <3 seconds b/l LE. Palpable DP pulse(s) b/l LE. Faintly palpable PT pulse(s) b/l LE. Pedal hair absent. No pain with calf compression b/l. Lower extremity skin temperature gradient within normal limits. Patient wearing compression hose on today's visit. +1Edema noted BLE. No ischemia or gangrene noted b/l LE. No cyanosis or clubbing noted b/l LE.  Dermatological Examination: Pedal skin thin, shiny and atrophic b/l LE.  No open wounds b/l. No interdigital macerations b/l. Toenails 1-5 b/l elongated, thickened, discolored with subungual debris. +Tenderness with dorsal palpation of nailplates. No hyperkeratotic or porokeratotic lesions present.  Neurological Examination: Pt has subjective symptoms of neuropathy. Protective sensation intact 5/5 intact bilaterally with 10g monofilament b/l. Vibratory sensation intact b/l.  Musculoskeletal Examination: Noted disuse atrophy bilaterally. No pain, crepitus or joint limitation noted with ROM bilateral LE. HAV with bunion deformity noted b/l LE. Severe hammertoe deformity noted 1-5 bilaterally. Utilizes transport chair for  ambulation assistance.  Assessment/Plan: 1. Pain due to onychomycosis of toenail   2. Diabetic peripheral neuropathy associated with type 2 diabetes mellitus (HCC)   -Examined patient. -Patient to continue soft, supportive shoe gear daily. -Mycotic toenails 1-5 bilaterally were debrided in length and girth with sterile nail nippers and dremel without incident. -Patient/POA to call should there be question/concern in the interim.   Return in about 3 months (around 12/13/2021).  Freddie Breech, DPM

## 2021-09-17 DIAGNOSIS — R4189 Other symptoms and signs involving cognitive functions and awareness: Secondary | ICD-10-CM | POA: Diagnosis not present

## 2021-09-17 DIAGNOSIS — R195 Other fecal abnormalities: Secondary | ICD-10-CM | POA: Diagnosis not present

## 2021-09-17 DIAGNOSIS — E039 Hypothyroidism, unspecified: Secondary | ICD-10-CM | POA: Diagnosis not present

## 2021-09-22 DIAGNOSIS — L03115 Cellulitis of right lower limb: Secondary | ICD-10-CM | POA: Diagnosis not present

## 2021-09-26 DIAGNOSIS — G309 Alzheimer's disease, unspecified: Secondary | ICD-10-CM | POA: Diagnosis not present

## 2021-09-26 DIAGNOSIS — I10 Essential (primary) hypertension: Secondary | ICD-10-CM | POA: Diagnosis not present

## 2021-09-26 DIAGNOSIS — M109 Gout, unspecified: Secondary | ICD-10-CM | POA: Diagnosis not present

## 2021-09-26 DIAGNOSIS — E11620 Type 2 diabetes mellitus with diabetic dermatitis: Secondary | ICD-10-CM | POA: Diagnosis not present

## 2021-09-26 DIAGNOSIS — R32 Unspecified urinary incontinence: Secondary | ICD-10-CM | POA: Diagnosis not present

## 2021-09-26 DIAGNOSIS — E1142 Type 2 diabetes mellitus with diabetic polyneuropathy: Secondary | ICD-10-CM | POA: Diagnosis not present

## 2021-09-26 DIAGNOSIS — L0390 Cellulitis, unspecified: Secondary | ICD-10-CM | POA: Diagnosis not present

## 2021-09-26 DIAGNOSIS — E039 Hypothyroidism, unspecified: Secondary | ICD-10-CM | POA: Diagnosis not present

## 2021-09-26 DIAGNOSIS — R195 Other fecal abnormalities: Secondary | ICD-10-CM | POA: Diagnosis not present

## 2021-09-26 DIAGNOSIS — K5900 Constipation, unspecified: Secondary | ICD-10-CM | POA: Diagnosis not present

## 2021-10-07 ENCOUNTER — Other Ambulatory Visit: Payer: Self-pay

## 2021-10-07 ENCOUNTER — Encounter: Payer: Self-pay | Admitting: Hematology

## 2021-10-07 ENCOUNTER — Emergency Department (HOSPITAL_COMMUNITY): Payer: Medicare PPO

## 2021-10-07 ENCOUNTER — Encounter (HOSPITAL_COMMUNITY): Payer: Self-pay | Admitting: Emergency Medicine

## 2021-10-07 ENCOUNTER — Inpatient Hospital Stay (HOSPITAL_COMMUNITY)
Admission: EM | Admit: 2021-10-07 | Discharge: 2021-10-11 | DRG: 065 | Disposition: A | Discharge status: Home Health | Payer: Medicare PPO | Attending: Internal Medicine | Admitting: Internal Medicine

## 2021-10-07 DIAGNOSIS — I2510 Atherosclerotic heart disease of native coronary artery without angina pectoris: Secondary | ICD-10-CM | POA: Diagnosis present

## 2021-10-07 DIAGNOSIS — Z7982 Long term (current) use of aspirin: Secondary | ICD-10-CM

## 2021-10-07 DIAGNOSIS — F0390 Unspecified dementia without behavioral disturbance: Secondary | ICD-10-CM | POA: Diagnosis present

## 2021-10-07 DIAGNOSIS — K219 Gastro-esophageal reflux disease without esophagitis: Secondary | ICD-10-CM | POA: Diagnosis present

## 2021-10-07 DIAGNOSIS — Z20822 Contact with and (suspected) exposure to covid-19: Secondary | ICD-10-CM | POA: Diagnosis not present

## 2021-10-07 DIAGNOSIS — I63411 Cerebral infarction due to embolism of right middle cerebral artery: Secondary | ICD-10-CM | POA: Diagnosis not present

## 2021-10-07 DIAGNOSIS — Z811 Family history of alcohol abuse and dependence: Secondary | ICD-10-CM

## 2021-10-07 DIAGNOSIS — I129 Hypertensive chronic kidney disease with stage 1 through stage 4 chronic kidney disease, or unspecified chronic kidney disease: Secondary | ICD-10-CM | POA: Diagnosis present

## 2021-10-07 DIAGNOSIS — Z8052 Family history of malignant neoplasm of bladder: Secondary | ICD-10-CM

## 2021-10-07 DIAGNOSIS — Z79899 Other long term (current) drug therapy: Secondary | ICD-10-CM

## 2021-10-07 DIAGNOSIS — R29818 Other symptoms and signs involving the nervous system: Secondary | ICD-10-CM | POA: Diagnosis not present

## 2021-10-07 DIAGNOSIS — Z9842 Cataract extraction status, left eye: Secondary | ICD-10-CM | POA: Diagnosis not present

## 2021-10-07 DIAGNOSIS — E1122 Type 2 diabetes mellitus with diabetic chronic kidney disease: Secondary | ICD-10-CM | POA: Diagnosis not present

## 2021-10-07 DIAGNOSIS — F03918 Unspecified dementia, unspecified severity, with other behavioral disturbance: Secondary | ICD-10-CM | POA: Diagnosis present

## 2021-10-07 DIAGNOSIS — I63441 Cerebral infarction due to embolism of right cerebellar artery: Secondary | ICD-10-CM | POA: Diagnosis present

## 2021-10-07 DIAGNOSIS — R251 Tremor, unspecified: Secondary | ICD-10-CM | POA: Diagnosis present

## 2021-10-07 DIAGNOSIS — Z8673 Personal history of transient ischemic attack (TIA), and cerebral infarction without residual deficits: Secondary | ICD-10-CM

## 2021-10-07 DIAGNOSIS — F05 Delirium due to known physiological condition: Secondary | ICD-10-CM | POA: Diagnosis not present

## 2021-10-07 DIAGNOSIS — Z880 Allergy status to penicillin: Secondary | ICD-10-CM

## 2021-10-07 DIAGNOSIS — R29702 NIHSS score 2: Secondary | ICD-10-CM | POA: Diagnosis present

## 2021-10-07 DIAGNOSIS — Z825 Family history of asthma and other chronic lower respiratory diseases: Secondary | ICD-10-CM

## 2021-10-07 DIAGNOSIS — Z974 Presence of external hearing-aid: Secondary | ICD-10-CM

## 2021-10-07 DIAGNOSIS — R4182 Altered mental status, unspecified: Secondary | ICD-10-CM | POA: Diagnosis not present

## 2021-10-07 DIAGNOSIS — D631 Anemia in chronic kidney disease: Secondary | ICD-10-CM | POA: Diagnosis present

## 2021-10-07 DIAGNOSIS — N183 Chronic kidney disease, stage 3 unspecified: Secondary | ICD-10-CM | POA: Diagnosis not present

## 2021-10-07 DIAGNOSIS — E890 Postprocedural hypothyroidism: Secondary | ICD-10-CM | POA: Diagnosis present

## 2021-10-07 DIAGNOSIS — Z8249 Family history of ischemic heart disease and other diseases of the circulatory system: Secondary | ICD-10-CM

## 2021-10-07 DIAGNOSIS — H9190 Unspecified hearing loss, unspecified ear: Secondary | ICD-10-CM | POA: Diagnosis present

## 2021-10-07 DIAGNOSIS — Z800 Family history of malignant neoplasm of digestive organs: Secondary | ICD-10-CM

## 2021-10-07 DIAGNOSIS — Z79890 Hormone replacement therapy (postmenopausal): Secondary | ICD-10-CM

## 2021-10-07 DIAGNOSIS — R7881 Bacteremia: Secondary | ICD-10-CM | POA: Diagnosis present

## 2021-10-07 DIAGNOSIS — I082 Rheumatic disorders of both aortic and tricuspid valves: Secondary | ICD-10-CM | POA: Diagnosis present

## 2021-10-07 DIAGNOSIS — Z96653 Presence of artificial knee joint, bilateral: Secondary | ICD-10-CM | POA: Diagnosis present

## 2021-10-07 DIAGNOSIS — Z9841 Cataract extraction status, right eye: Secondary | ICD-10-CM

## 2021-10-07 DIAGNOSIS — R4701 Aphasia: Secondary | ICD-10-CM | POA: Diagnosis not present

## 2021-10-07 DIAGNOSIS — N179 Acute kidney failure, unspecified: Secondary | ICD-10-CM | POA: Diagnosis present

## 2021-10-07 DIAGNOSIS — R509 Fever, unspecified: Secondary | ICD-10-CM | POA: Diagnosis present

## 2021-10-07 DIAGNOSIS — M810 Age-related osteoporosis without current pathological fracture: Secondary | ICD-10-CM | POA: Diagnosis present

## 2021-10-07 DIAGNOSIS — E785 Hyperlipidemia, unspecified: Secondary | ICD-10-CM | POA: Diagnosis present

## 2021-10-07 DIAGNOSIS — G4700 Insomnia, unspecified: Secondary | ICD-10-CM | POA: Diagnosis present

## 2021-10-07 DIAGNOSIS — Z8042 Family history of malignant neoplasm of prostate: Secondary | ICD-10-CM

## 2021-10-07 DIAGNOSIS — B965 Pseudomonas (aeruginosa) (mallei) (pseudomallei) as the cause of diseases classified elsewhere: Secondary | ICD-10-CM | POA: Diagnosis not present

## 2021-10-07 DIAGNOSIS — I639 Cerebral infarction, unspecified: Secondary | ICD-10-CM | POA: Diagnosis not present

## 2021-10-07 DIAGNOSIS — L03115 Cellulitis of right lower limb: Secondary | ICD-10-CM | POA: Diagnosis not present

## 2021-10-07 DIAGNOSIS — Z841 Family history of disorders of kidney and ureter: Secondary | ICD-10-CM

## 2021-10-07 DIAGNOSIS — R000 Tachycardia, unspecified: Secondary | ICD-10-CM | POA: Diagnosis not present

## 2021-10-07 DIAGNOSIS — G4733 Obstructive sleep apnea (adult) (pediatric): Secondary | ICD-10-CM | POA: Diagnosis present

## 2021-10-07 DIAGNOSIS — I10 Essential (primary) hypertension: Secondary | ICD-10-CM | POA: Diagnosis not present

## 2021-10-07 DIAGNOSIS — L03116 Cellulitis of left lower limb: Secondary | ICD-10-CM | POA: Diagnosis present

## 2021-10-07 DIAGNOSIS — R4020 Unspecified coma: Secondary | ICD-10-CM | POA: Diagnosis not present

## 2021-10-07 DIAGNOSIS — R778 Other specified abnormalities of plasma proteins: Secondary | ICD-10-CM | POA: Diagnosis present

## 2021-10-07 DIAGNOSIS — I6389 Other cerebral infarction: Secondary | ICD-10-CM | POA: Diagnosis not present

## 2021-10-07 DIAGNOSIS — Z7401 Bed confinement status: Secondary | ICD-10-CM

## 2021-10-07 LAB — COMPREHENSIVE METABOLIC PANEL
ALT: 20 U/L (ref 0–44)
AST: 33 U/L (ref 15–41)
Albumin: 3.8 g/dL (ref 3.5–5.0)
Alkaline Phosphatase: 73 U/L (ref 38–126)
Anion gap: 9 (ref 5–15)
BUN: 53 mg/dL — ABNORMAL HIGH (ref 8–23)
CO2: 26 mmol/L (ref 22–32)
Calcium: 10.2 mg/dL (ref 8.9–10.3)
Chloride: 101 mmol/L (ref 98–111)
Creatinine, Ser: 1.25 mg/dL — ABNORMAL HIGH (ref 0.44–1.00)
GFR, Estimated: 39 mL/min — ABNORMAL LOW (ref >60)
Glucose, Bld: 149 mg/dL — ABNORMAL HIGH (ref 70–99)
Potassium: 4.4 mmol/L (ref 3.5–5.1)
Sodium: 136 mmol/L (ref 135–145)
Total Bilirubin: 0.8 mg/dL (ref 0.3–1.2)
Total Protein: 7.1 g/dL (ref 6.5–8.1)

## 2021-10-07 LAB — TROPONIN I (HIGH SENSITIVITY): Troponin I (High Sensitivity): 58 ng/L — ABNORMAL HIGH (ref <18)

## 2021-10-07 LAB — URINALYSIS, ROUTINE W REFLEX MICROSCOPIC
Bacteria, UA: NONE SEEN
Bilirubin Urine: NEGATIVE
Glucose, UA: NEGATIVE mg/dL
Ketones, ur: NEGATIVE mg/dL
Leukocytes,Ua: NEGATIVE
Nitrite: NEGATIVE
Protein, ur: 100 mg/dL — AB
Specific Gravity, Urine: 1.013 (ref 1.005–1.030)
pH: 6 (ref 5.0–8.0)

## 2021-10-07 LAB — CBC WITH DIFFERENTIAL/PLATELET
Abs Immature Granulocytes: 0.11 10*3/uL — ABNORMAL HIGH (ref 0.00–0.07)
Basophils Absolute: 0 10*3/uL (ref 0.0–0.1)
Basophils Relative: 0 %
Eosinophils Absolute: 0 10*3/uL (ref 0.0–0.5)
Eosinophils Relative: 0 %
HCT: 36.5 % (ref 36.0–46.0)
Hemoglobin: 11.8 g/dL — ABNORMAL LOW (ref 12.0–15.0)
Immature Granulocytes: 1 %
Lymphocytes Relative: 3 %
Lymphs Abs: 0.5 10*3/uL — ABNORMAL LOW (ref 0.7–4.0)
MCH: 27.6 pg (ref 26.0–34.0)
MCHC: 32.3 g/dL (ref 30.0–36.0)
MCV: 85.5 fL (ref 80.0–100.0)
Monocytes Absolute: 0.7 10*3/uL (ref 0.1–1.0)
Monocytes Relative: 4 %
Neutro Abs: 15.9 10*3/uL — ABNORMAL HIGH (ref 1.7–7.7)
Neutrophils Relative %: 92 %
Platelets: 219 10*3/uL (ref 150–400)
RBC: 4.27 MIL/uL (ref 3.87–5.11)
RDW: 14.5 % (ref 11.5–15.5)
WBC: 17.2 10*3/uL — ABNORMAL HIGH (ref 4.0–10.5)
nRBC: 0 % (ref 0.0–0.2)

## 2021-10-07 LAB — SARS CORONAVIRUS 2 BY RT PCR: SARS Coronavirus 2 by RT PCR: NEGATIVE

## 2021-10-07 LAB — LACTIC ACID, PLASMA: Lactic Acid, Venous: 1.1 mmol/L (ref 0.5–1.9)

## 2021-10-07 MED ORDER — VANCOMYCIN HCL IN DEXTROSE 1-5 GM/200ML-% IV SOLN
1000.0000 mg | Freq: Once | INTRAVENOUS | Status: AC
  Administered 2021-10-08: 1000 mg via INTRAVENOUS
  Filled 2021-10-07: qty 200

## 2021-10-07 MED ORDER — SODIUM CHLORIDE 0.9 % IV SOLN
2.0000 g | INTRAVENOUS | Status: DC
  Administered 2021-10-08 – 2021-10-10 (×4): 2 g via INTRAVENOUS
  Filled 2021-10-07 (×4): qty 12.5

## 2021-10-07 MED ORDER — SODIUM CHLORIDE 0.9 % IV BOLUS
500.0000 mL | Freq: Once | INTRAVENOUS | Status: AC
  Administered 2021-10-07: 500 mL via INTRAVENOUS

## 2021-10-07 MED ORDER — LORAZEPAM 2 MG/ML IJ SOLN
0.5000 mg | Freq: Once | INTRAMUSCULAR | Status: AC | PRN
  Administered 2021-10-07: 0.5 mg via INTRAVENOUS
  Filled 2021-10-07: qty 1

## 2021-10-07 MED ORDER — ACETAMINOPHEN 325 MG PO TABS
650.0000 mg | ORAL_TABLET | Freq: Once | ORAL | Status: AC
Start: 2021-10-07 — End: 2021-10-07
  Administered 2021-10-07: 650 mg via ORAL
  Filled 2021-10-07: qty 2

## 2021-10-07 NOTE — ED Provider Notes (Signed)
[facility] EMERGENCY DEPARTMENT Provider Note   CSN: [id number] Arrival date & time: 10/07/21  1958     History  Chief Complaint  Patient presents with   Tremors    VANNESSA GODOWN is a 88 y.o. female hx of CKD, dementia, DM, here with tremors.  Patient lives at home with daughter.  Patient was noted to be tremulous per the daughter.  Patient stands up from chair and uses walker at baseline.  Patient has a history of dementia unable to give much history.   The history is provided by the patient and the EMS personnel.   Level V caveat- dementia      Home Medications Prior to Admission medications   Medication Sig Start Date End Date Taking? Authorizing Provider  acetaminophen (TYLENOL) 325 MG tablet Take 650 mg by mouth every 6 (six) hours as needed for mild pain.    [provider]  acetaminophen (TYLENOL) 325 MG tablet 1 tablet as needed    [provider]  allopurinol (ZYLOPRIM) 100 MG tablet TK 1 T PO  D 12/02/17   [provider]  allopurinol (ZYLOPRIM) 100 MG tablet Take 1 tablet by mouth daily.    [provider]  amLODipine (NORVASC) 5 MG tablet Take 5 mg by mouth daily.    [provider]  amLODipine (NORVASC) 5 MG tablet 1 tablet    [provider]  aspirin EC 81 MG tablet Take 81 mg by mouth daily.    [provider]  celecoxib (CELEBREX) 200 MG capsule 1 capsule with food    [provider]  CHILDRENS IBUPROFEN 100 PO Take 2 tablets by mouth as needed.    [provider]  clotrimazole (LOTRIMIN) 1 % cream 1 application to affected area 12/09/16   [provider]  coconut oil OIL 1 application as needed. 1 tsp    [provider]  colchicine 0.6 MG tablet Take 1 tablet (0.6 mg total) by mouth daily. Take 1 tablet by mouth daily for gout flare. 12/09/19   Delories Heinz, DPM  Cyanocobalamin (VITAMIN B12 PO) Take 7 drops by mouth daily.    [provider]  docusate sodium (COLACE) 100 MG capsule 1 capsule as needed    [provider]  Emollient (UDDERLY SMOOTH) CREA Apply 1 application topically 2 (two) times daily as needed (for dry skin related to healing cellulitis).    [provider]  estradiol (ESTRACE) 0.1 MG/GM vaginal cream SMARTSIG:1 Applicator Vaginal Twice a Week 06/26/21   [provider]  ferrous sulfate 325 (65 FE) MG tablet Take 3 mg by mouth. Take 3 tablets daily.    [provider]  folic acid (FOLVITE) 1 MG tablet Take 1 tablet (1 mg total) by mouth daily. 04/03/16   Kimber Relic, MD  furosemide (LASIX) 40 MG tablet Take one tablet by mouth once daily for edema Patient taking differently: Take 40 mg by mouth daily. for edema 04/03/16   Kimber Relic, MD  gabapentin (NEURONTIN) 100 MG capsule Take 2 capsules by mouth daily.    [provider]  gentamicin ointment (GARAMYCIN) 0.1 % Apply topically 3 (three) times daily. 02/01/21   [provider]  glucose blood test strip One Touch Verio Test Strips. Use to test blood sugar once daily. Dx:E11.9 05/01/16   Kimber Relic, MD  HYDROcodone-acetaminophen (NORCO) 7.5-325 MG tablet 1 tablet as needed    [provider]  levothyroxine (SYNTHROID)  25 MCG tablet 1 tablet every morning on an empty stomach    [provider]  levothyroxine (SYNTHROID, LEVOTHROID) 25 MCG tablet take 1 tablet by mouth once daily for THYROID SUPPLEMENT 07/11/16   Kimber Relic, MD  lidocaine (ASPERCREME W/LIDOCAINE) 4 % cream Apply 4 times daily to right shoulder Patient taking differently: Apply 1 application topically 4 (four) times daily as needed. Apply 4 times daily to right shoulder 06/19/16   Kimber Relic, MD  mirtazapine (REMERON) 15 MG tablet 1 tablet at bedtime. 12/23/19   [provider]  Multiple Vitamin (MULTIVITAMIN WITH MINERALS) TABS tablet Take 1 tablet by mouth daily. Centrum    [provider]  NON FORMULARY     [provider]  NON FORMULARY Take by mouth 2 (two) times daily. Neurstem. 2 capsules in the morning  and 2 capsules in the evening    [provider]  Omega-3 Fatty Acids (OMEGA-3 FISH OIL PO) Take by mouth daily. Take one tablespoon    [provider]  omeprazole (PRILOSEC OTC) 20 MG tablet 1 tablet    [provider]  omeprazole (PRILOSEC) 20 MG capsule TK 1 C PO QD 12/10/17   [provider]  [facility] DELICA LANCETS FINE MISC Use to test sugar once daily. Dx:E11.9 09/05/16   Reed, Tiffany L, DO  predniSONE (DELTASONE) 5 MG tablet Take by mouth. 06/12/20   [provider]  saccharomyces boulardii (FLORASTOR) 250 MG capsule 1 capsule    [provider]  silver sulfADIAZINE (SILVADENE) 1 % cream Apply to toes once dialy as needed for any wounds. 05/09/21   Freddie Breech, DPM  sodium chloride (OCEAN) 0.65 % SOLN nasal spray Place 2 sprays into both nostrils as needed for congestion.    [provider]  tobramycin-dexamethasone Wallene Dales) ophthalmic solution Place 1 drop into the left eye 4 (four) times daily. 05/26/19   [provider]  triamterene-hydrochlorothiazide (MAXZIDE-25) 37.5-25 MG tablet 1 tablet in the morning 09/20/20   [provider]  UNABLE TO FIND Med Name: Ruffin Frederick Powder. Half a teaspon in 4 oz of water.    [provider]  Vibegron (GEMTESA) 75 MG TABS 1 tablet 09/20/20   [provider]  VITAMIN D, ERGOCALCIFEROL, PO Take 3 drops by mouth daily.    [provider]      Allergies    Penicillins    Review of Systems   Review of Systems  Neurological:  Positive for tremors.  All other systems reviewed and are negative.   Physical Exam Updated Vital Signs BP (!) 112/52   Pulse 79   Temp (!) 102.2 F (39 C) (Rectal)   Resp 18   SpO2 98%  Physical Exam Vitals and nursing note reviewed.  Constitutional:       Appearance: Normal appearance.     Comments: Demented but well-appearing  HENT:     Head: Normocephalic.     Nose: Nose normal.     Mouth/Throat:     Mouth: Mucous membranes are moist.  Eyes:     Extraocular Movements: Extraocular movements intact.     Pupils: Pupils are equal, round, and reactive to light.  Cardiovascular:     Rate and Rhythm: Normal rate and regular rhythm.     Pulses: Normal pulses.     Heart sounds: Normal heart sounds.  Pulmonary:     Effort: Pulmonary effort is normal.     Breath sounds: Normal breath sounds.  Abdominal:     General: Abdomen is flat.     Palpations: Abdomen is soft.  Musculoskeletal:        General: Normal range of motion.     Cervical back: Normal range of motion and neck supple.  Skin:    General: Skin is warm.     Capillary Refill: Capillary refill takes less than 2 seconds.  Neurological:     General: No focal deficit present.     Mental Status: She is alert.     Comments: Demented but moving all extremities. Patient has some resting tremors  Psychiatric:        Mood and Affect: Mood normal.        Behavior: Behavior normal.     ED Results / Procedures / Treatments   Labs (all labs ordered are listed, but only abnormal results are displayed) Labs Reviewed  CBC WITH DIFFERENTIAL/PLATELET - Abnormal; Notable for the following components:      Result Value   WBC 17.2 (*)    Hemoglobin 11.8 (*)    Neutro Abs 15.9 (*)    Lymphs Abs 0.5 (*)    Abs Immature Granulocytes 0.11 (*)    All other components within normal limits  COMPREHENSIVE METABOLIC PANEL - Abnormal; Notable for the following components:   Glucose, Bld 149 (*)    BUN 53 (*)    Creatinine, Ser 1.25 (*)    GFR, Estimated 39 (*)    All other components within normal limits  URINALYSIS, ROUTINE W REFLEX MICROSCOPIC - Abnormal; Notable for the following components:   Hgb urine dipstick MODERATE (*)    Protein, ur 100 (*)    All other components within normal  limits  TROPONIN I (HIGH SENSITIVITY) - Abnormal; Notable for the following components:   Troponin I (High Sensitivity) 58 (*)    All other components within normal limits  SARS CORONAVIRUS 2 BY RT PCR  URINE CULTURE  CULTURE, BLOOD (ROUTINE X 2)  CULTURE, BLOOD (ROUTINE X 2)  LACTIC ACID, PLASMA  LACTIC ACID, PLASMA  TROPONIN I (HIGH SENSITIVITY)    EKG EKG Interpretation  Date/Time:  Sunday October 07 2021 20:11:58 EDT Ventricular Rate:  107 PR Interval:  178 QRS Duration: 140 QT Interval:  386 QTC Calculation: 515 R Axis:   -72 Text Interpretation: Sinus tachycardia with irregular rate IVCD, consider atypical RBBB LVH with IVCD and secondary repol abnrm Inferior infarct, acute Lateral leads are also involved Prolonged QT interval Artifact in lead(s) I III aVR aVL V1 V2 Since last tracing rate faster Confirmed by Richardean Canal [phone]) on 10/07/2021 8:25:13 PM  Radiology CT HEAD WO CONTRAST ( )  Result Date: 10/07/2021 CLINICAL DATA:  Mental status change. EXAM: CT HEAD WITHOUT CONTRAST TECHNIQUE: Contiguous axial images were obtained from the base of the skull through the vertex without intravenous contrast. RADIATION DOSE REDUCTION: This exam was performed according to the departmental dose-optimization program which includes automated exposure control, adjustment of the mA and/or kV according to patient size and/or use of iterative reconstruction technique. COMPARISON:  None Available. FINDINGS: Brain: Area of hypoattenuation in the left frontoparietal region spends the cortex and the subcortical white matter no evidence of hydrocephalus or mass effect. Mild brain parenchymal volume loss and deep white matter microangiopathy for age. Vascular: No hyperdense vessel or unexpected calcification. Skull: Normal. Negative for fracture or focal lesion. Sinuses/Orbits: No acute finding. Other: None. IMPRESSION: 1. Area of hypoattenuation in the left frontoparietal region spends the cortex and  the subcortical white matter, which may represent an acute or subacute infarct. Further evaluation with MRI of the brain may be considered, if found clinically appropriate. 2. Mild brain parenchymal volume loss and deep white matter microangiopathy for age. Electronically Signed   By: Ted Mcalpine M.D.   On: 10/07/2021 21:09   DG Chest Port 1 View  Result Date: 10/07/2021 CLINICAL DATA:  Altered mental status EXAM: PORTABLE CHEST 1 VIEW COMPARISON:  05/05/2021 FINDINGS: Heart is borderline in size. Aortic atherosclerosis. No confluent airspace opacities or effusions. No acute bony abnormality. IMPRESSION: No active disease. Electronically Signed   By: Charlett Nose M.D.   On: 10/07/2021 21:00    Procedures Procedures    Medications Ordered in ED Medications  vancomycin (VANCOCIN) IVPB 1000 mg/200 mL premix (has no administration in time range)  ceFEPIme (MAXIPIME) 2 g in sodium chloride 0.9 % 100 mL IVPB (has no administration in time range)  acetaminophen (TYLENOL) tablet 650 mg (650 mg Oral Given 10/07/21 2138)  LORazepam (ATIVAN) injection 0.5 mg (0.5 mg Intravenous Given 10/07/21 2244)  sodium chloride 0.9 % bolus 500 mL (0 mLs Intravenous Stopped 10/07/21 2244)    ED Course/ Medical Decision Making/ A&P                           Medical Decision Making RESHMA HOEY is a 88 y.o. female here presenting with tremors.  Patient is febrile in triage.  I think the tremors likely secondary to fever.  Consider UTI versus pneumonia versus COVID.  Patient demented so we will get a CT head to rule out a bleed. Will get cbc, cmp, lactate, cultures, CT head.   11 pm I reviewed patient's labs and independently interpreted imaging studies.  Patient white blood cell count is 17.  Patient CT showed possible acute to subacute left frontal parietal stroke.  I discussed case with Dr. Leeroy Bock.  He recommend MRI brain.  Patient likely needs admission for sepsis with unclear etiology.  If MRI showed a  stroke, patient will need formal neurology consult.  Signed out to Dr. Pilar Plate  Problems Addressed: Fever, unspecified fever cause: acute illness or injury Tremor: acute illness or injury  Amount and/or Complexity of Data Reviewed Labs: ordered. Decision-making details documented in ED Course. Radiology: ordered and independent interpretation performed. Decision-making details documented in ED Course. ECG/medicine tests: ordered and independent interpretation performed. Decision-making details documented in ED Course.  Risk OTC drugs. Prescription drug management. Decision regarding hospitalization.    Final Clinical Impression(s) / ED Diagnoses Final diagnoses:  Fever, unspecified fever cause  Tremor    Rx / DC Orders ED Discharge Orders     None         Charlynne Pander, MD 10/07/21 2332

## 2021-10-07 NOTE — ED Provider Notes (Signed)
  Provider Note MRN:  [id number]  Arrival date & time: 10/07/21    ED Course and Medical Decision Making  Assumed care from Dr. Silverio Lay at shift change.  Fever unclear source, advanced dementia possible stroke will need admission.  11:30 PM update: On my evaluation patient is sleeping comfortably, likely still with ativan on board from MRI.  Tremors earlier today with fever of 102 raises concern for rigors and bacteremia.  Recent diarrheal illness per daughter at bedside.  Benign abdomen at this time.  MRI positive for acute stroke.  Neurology is consulted, will admit to medicine.  .Critical Care  Performed by: Sabas Sous, MD Authorized by: Sabas Sous, MD   Critical care provider statement:    Critical care time (minutes):  35   Critical care was necessary to treat or prevent imminent or life-threatening deterioration of the following conditions: acute ischemic stroke.   Critical care was time spent personally by me on the following activities:  Development of treatment plan with patient or surrogate, discussions with consultants, evaluation of patient's response to treatment, examination of patient, ordering and review of laboratory studies, ordering and review of radiographic studies, ordering and performing treatments and interventions, pulse oximetry, re-evaluation of patient's condition and review of old charts   Final Clinical Impressions(s) / ED Diagnoses     ICD-10-CM   1. Fever, unspecified fever cause  R50.9     2. Tremor  R25.1     3. Cerebrovascular accident (CVA), unspecified mechanism (HCC)  I63.9       ED Discharge Orders     None       Discharge Instructions   None     Elmer Sow. Pilar Plate, MD [facility] Emergency [facility] [email or web address]    Sabas Sous, MD 10/07/21 [phone]

## 2021-10-07 NOTE — ED Notes (Signed)
Pt transported to MRI 
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
## 2021-10-07 NOTE — ED Triage Notes (Signed)
Pt brought to ED by [facility] with x/o "tremors" while trying to stand up from chair using walker. EMS reports that family member felt concerned d/t this being abnormal for patient. Has hx of dementia.

## 2021-10-07 NOTE — Progress Notes (Signed)
Pharmacy Antibiotic Note  Jessica Hawkins is a 88 y.o. female admitted on 10/07/2021 with  infection of unknown source .  Pharmacy has been consulted for cefepime dosing. SCr 1.25 on presentation.  Plan: Cefepime 2g IV q24h Monitor clinical progress, c/s, renal function F/u de-escalation plan/LOT    Temp (24hrs), Avg:102.2 F (39 C), Min:102.2 F (39 C), Max:102.2 F (39 C)  Recent Labs  Lab 10/07/21 2040  WBC 17.2*  CREATININE 1.25*  LATICACIDVEN 1.1    CrCl cannot be calculated (Unknown ideal weight.).    Allergies  Allergen Reactions   Penicillins Other (See Comments)    Other reaction(s): yeast infection    Leia Alf, PharmD, BCPS Please check AMION for all [facility] contact numbers Clinical Pharmacist 10/07/2021 11:09 PM

## 2021-10-08 ENCOUNTER — Inpatient Hospital Stay (HOSPITAL_COMMUNITY): Payer: Medicare PPO

## 2021-10-08 ENCOUNTER — Encounter (HOSPITAL_COMMUNITY): Payer: Self-pay | Admitting: Internal Medicine

## 2021-10-08 DIAGNOSIS — R509 Fever, unspecified: Secondary | ICD-10-CM | POA: Diagnosis not present

## 2021-10-08 DIAGNOSIS — I10 Essential (primary) hypertension: Secondary | ICD-10-CM

## 2021-10-08 DIAGNOSIS — F0390 Unspecified dementia without behavioral disturbance: Secondary | ICD-10-CM

## 2021-10-08 DIAGNOSIS — R4182 Altered mental status, unspecified: Secondary | ICD-10-CM

## 2021-10-08 DIAGNOSIS — I639 Cerebral infarction, unspecified: Secondary | ICD-10-CM | POA: Diagnosis not present

## 2021-10-08 DIAGNOSIS — I6389 Other cerebral infarction: Secondary | ICD-10-CM | POA: Diagnosis not present

## 2021-10-08 LAB — BLOOD CULTURE ID PANEL (REFLEXED) - BCID2

## 2021-10-08 LAB — COMPREHENSIVE METABOLIC PANEL
ALT: 17 U/L (ref 0–44)
AST: 26 U/L (ref 15–41)
Albumin: 2.9 g/dL — ABNORMAL LOW (ref 3.5–5.0)
Alkaline Phosphatase: 53 U/L (ref 38–126)
Anion gap: 5 (ref 5–15)
BUN: 45 mg/dL — ABNORMAL HIGH (ref 8–23)
CO2: 27 mmol/L (ref 22–32)
Calcium: 9.1 mg/dL (ref 8.9–10.3)
Chloride: 105 mmol/L (ref 98–111)
Creatinine, Ser: 1.08 mg/dL — ABNORMAL HIGH (ref 0.44–1.00)
GFR, Estimated: 47 mL/min — ABNORMAL LOW (ref >60)
Glucose, Bld: 130 mg/dL — ABNORMAL HIGH (ref 70–99)
Potassium: 4.3 mmol/L (ref 3.5–5.1)
Sodium: 137 mmol/L (ref 135–145)
Total Bilirubin: 1 mg/dL (ref 0.3–1.2)
Total Protein: 6.2 g/dL — ABNORMAL LOW (ref 6.5–8.1)

## 2021-10-08 LAB — ECHOCARDIOGRAM COMPLETE
AR max vel: 2.15 cm2
AV Area VTI: 2.38 cm2
AV Area mean vel: 2.04 cm2
AV Mean grad: 11 mmHg
AV Peak grad: 19.8 mmHg
Ao pk vel: 2.23 m/s
Area-P 1/2: 4.57 cm2
Height: 56.25 in
P 1/2 time: 318 ms
S' Lateral: 3 cm
Weight: 1920 [oz_av]

## 2021-10-08 LAB — URINE CULTURE: Culture: NO GROWTH

## 2021-10-08 LAB — CBC
HCT: 33.4 % — ABNORMAL LOW (ref 36.0–46.0)
Hemoglobin: 10.4 g/dL — ABNORMAL LOW (ref 12.0–15.0)
MCH: 26.9 pg (ref 26.0–34.0)
MCHC: 31.1 g/dL (ref 30.0–36.0)
MCV: 86.3 fL (ref 80.0–100.0)
Platelets: 189 10*3/uL (ref 150–400)
RBC: 3.87 MIL/uL (ref 3.87–5.11)
RDW: 14.6 % (ref 11.5–15.5)
WBC: 20.3 10*3/uL — ABNORMAL HIGH (ref 4.0–10.5)
nRBC: 0 % (ref 0.0–0.2)

## 2021-10-08 LAB — HEMOGLOBIN A1C
Hgb A1c MFr Bld: 5.7 % — ABNORMAL HIGH (ref 4.8–5.6)
Mean Plasma Glucose: 116.89 mg/dL

## 2021-10-08 LAB — LIPID PANEL
Cholesterol: 154 mg/dL (ref 0–200)
HDL: 67 mg/dL
LDL Cholesterol: 82 mg/dL (ref 0–99)
Total CHOL/HDL Ratio: 2.3 ratio
Triglycerides: 26 mg/dL
VLDL: 5 mg/dL (ref 0–40)

## 2021-10-08 LAB — TROPONIN I (HIGH SENSITIVITY)
Troponin I (High Sensitivity): 88 ng/L — ABNORMAL HIGH (ref <18)
Troponin I (High Sensitivity): 101 ng/L
Troponin I (High Sensitivity): 86 ng/L — ABNORMAL HIGH

## 2021-10-08 LAB — LACTIC ACID, PLASMA: Lactic Acid, Venous: 1 mmol/L (ref 0.5–1.9)

## 2021-10-08 MED ORDER — SODIUM CHLORIDE 0.9 % IV SOLN: INTRAVENOUS | Status: AC

## 2021-10-08 MED ORDER — CLOPIDOGREL BISULFATE 75 MG PO TABS
75.0000 mg | ORAL_TABLET | Freq: Every day | ORAL | Status: DC
  Administered 2021-10-08 – 2021-10-11 (×4): 75 mg via ORAL
  Filled 2021-10-08 (×4): qty 1

## 2021-10-08 MED ORDER — ACETAMINOPHEN 325 MG PO TABS: 650.0000 mg | ORAL_TABLET | ORAL | Status: DC | PRN

## 2021-10-08 MED ORDER — ASPIRIN 300 MG RE SUPP
300.0000 mg | Freq: Every day | RECTAL | Status: DC
  Filled 2021-10-08: qty 1

## 2021-10-08 MED ORDER — STROKE: EARLY STAGES OF RECOVERY BOOK
Freq: Once | Status: AC
  Filled 2021-10-08: qty 1

## 2021-10-08 MED ORDER — VANCOMYCIN HCL 500 MG/100ML IV SOLN: 500.0000 mg | INTRAVENOUS | Status: DC

## 2021-10-08 MED ORDER — HYDRALAZINE HCL 20 MG/ML IJ SOLN: 5.0000 mg | INTRAMUSCULAR | Status: DC | PRN

## 2021-10-08 MED ORDER — ENOXAPARIN SODIUM 30 MG/0.3ML IJ SOSY
30.0000 mg | PREFILLED_SYRINGE | INTRAMUSCULAR | Status: DC
  Administered 2021-10-08 – 2021-10-11 (×4): 30 mg via SUBCUTANEOUS
  Filled 2021-10-08 (×4): qty 0.3

## 2021-10-08 MED ORDER — ASPIRIN 81 MG PO CHEW
81.0000 mg | CHEWABLE_TABLET | Freq: Every day | ORAL | Status: DC
  Administered 2021-10-08 – 2021-10-11 (×4): 81 mg via ORAL
  Filled 2021-10-08 (×4): qty 1

## 2021-10-08 MED ORDER — PRAVASTATIN SODIUM 40 MG PO TABS
20.0000 mg | ORAL_TABLET | Freq: Every day | ORAL | Status: DC
  Administered 2021-10-08 – 2021-10-10 (×3): 20 mg via ORAL
  Filled 2021-10-08 (×3): qty 1

## 2021-10-08 MED ORDER — ACETAMINOPHEN 650 MG RE SUPP: 650.0000 mg | RECTAL | Status: DC | PRN

## 2021-10-08 MED ORDER — ACETAMINOPHEN 160 MG/5ML PO SOLN: 650.0000 mg | ORAL | Status: DC | PRN
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
## 2021-10-08 NOTE — Progress Notes (Signed)
  Echocardiogram 2D Echocardiogram has been performed.  Jessica Hawkins 10/08/2021, 11:47 AM

## 2021-10-08 NOTE — Progress Notes (Signed)
OT Cancellation Note  Patient Details Name: Jessica Hawkins MRN: [id number] DOB: [birth year]   Cancelled Treatment:    Reason Eval/Treat Not Completed: Patient at procedure or test/ unavailable (pt off unit, will follow up as schedule permits)  Alfonzo Beers, OTD, OTR/L Acute Rehab [phone]) [phone]   Mayer Masker 10/08/2021, 9:35 AM

## 2021-10-08 NOTE — Progress Notes (Signed)
VASCULAR LAB    Bilateral lower extremity venous duplex has been performed.  See CV proc for preliminary results.   Geordie Nooney, RVT 10/08/2021, 10:11 AM

## 2021-10-08 NOTE — Evaluation (Signed)
Occupational Therapy Evaluation Patient Details Name: Jessica Hawkins MRN: [id number] DOB: 01/13/1923 Today's Date: 10/08/2021   History of Present Illness Pt is a 88 y/o female who presents 10/07/2021 with chills and rigors. 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 PMH significant for R BBB, CKD, HTN, hypothyroidism, OA, osteoporosis, DM II, memory deficits, B IM nail, ORIF R hip 2002, L rotator cuff repair 1999, B THR 2017/ 2018.   Clinical Impression   Pt needing assist at baseline for ADLs and mobility, lives with grandson and has 24/7 caregiver that assists her. Pt currently needing min-max A for ADLs, mod A +2 for bed mobility and mod-max A +2 for transfers with RW. Pt with BUE weakness, and posterior bias with transfers, however per caregiver pt appears to be at baseline. Pt presenting with impairments listed below, will follow acutely. Recommend HHOT d/c and 24/7 assistance.      Recommendations for follow up therapy are one component of a multi-disciplinary discharge planning process, led by the attending physician.  Recommendations may be updated based on patient status, additional functional criteria and insurance authorization.   Follow Up Recommendations  Home health OT    Assistance Recommended at Discharge Frequent or constant Supervision/Assistance  Patient can return home with the following Assistance with cooking/housework;Direct supervision/assist for medications management;Direct supervision/assist for financial management;Help with stairs or ramp for entrance;Assist for transportation;A lot of help with bathing/dressing/bathroom;A little help with walking and/or transfers    Functional Status Assessment  Patient has had a recent decline in their functional status and demonstrates the ability to make significant improvements in function in a reasonable and predictable amount of time.  Equipment Recommendations  None recommended by OT;Other (comment) (pt has all needed  DME)    Recommendations for Other Services PT consult     Precautions / Restrictions Precautions Precautions: Fall Precaution Comments: Heavy posterior bias initially upon stand. Restrictions Weight Bearing Restrictions: No      Mobility Bed Mobility Overal bed mobility: Needs Assistance Bed Mobility: Supine to Sit     Supine to sit: Mod assist, +2 for physical assistance     General bed mobility comments: VC's for use of railing. Assist for LE advancement towards EOB and bed pad utilized for transition to full sitting position. Increased time to scoot out and get feet on the floor.    Transfers Overall transfer level: Needs assistance Equipment used: Rolling walker (2 wheels) Transfers: Sit to/from Stand, Bed to chair/wheelchair/BSC Sit to Stand: Max assist, +2 physical assistance     Step pivot transfers: Mod assist, +2 physical assistance, +2 safety/equipment     General transfer comment: Heavy +2 assist to power up to full stand. Posterior bias, and increased time to gain/maintain standing balance. +2 mod assist for transition to recliner for balance support, safety, cues, and walker management.      Balance Overall balance assessment: Needs assistance Sitting-balance support: Feet supported, Bilateral upper extremity supported Sitting balance-Leahy Scale: Poor     Standing balance support: Bilateral upper extremity supported, During functional activity, Reliant on assistive device for balance Standing balance-Leahy Scale: Zero Standing balance comment: +2 assist required                           ADL either performed or assessed with clinical judgement   ADL Overall ADL's : Needs assistance/impaired Eating/Feeding: Minimal assistance   Grooming: Minimal assistance   Upper Body Bathing: Minimal assistance  Lower Body Bathing: Moderate assistance   Upper Body Dressing : Minimal assistance   Lower Body Dressing: Moderate assistance    Toilet Transfer: Moderate assistance;+2 for safety/equipment;+2 for physical assistance   Toileting- Clothing Manipulation and Hygiene: Maximal assistance;Sitting/lateral lean;Sit to/from stand       Functional mobility during ADLs: Moderate assistance;+2 for physical assistance       Vision   Vision Assessment?: No apparent visual deficits Additional Comments: pt able to read paragraph without difficutly, no noted deficits at this time, will further assess     Perception     Praxis      Pertinent Vitals/Pain Pain Assessment Pain Assessment: No/denies pain     Hand Dominance Right   Extremity/Trunk Assessment Upper Extremity Assessment Upper Extremity Assessment: Generalized weakness   Lower Extremity Assessment Lower Extremity Assessment: Defer to PT evaluation RLE Deficits / Details: Bilaterally pt with good strength during MMT but during OOB mobility pt demonstrating quick muscular fatigue.   Cervical / Trunk Assessment Cervical / Trunk Assessment: Kyphotic   Communication Communication Communication: HOH   Cognition Arousal/Alertness: Awake/alert Behavior During Therapy: Flat affect Overall Cognitive Status: History of cognitive impairments - at baseline Area of Impairment: Orientation, Attention, Memory, Following commands, Safety/judgement, Awareness, Problem solving                 Orientation Level: Disoriented to, Place, Time, Situation Current Attention Level: Focused Memory: Decreased short-term memory Following Commands: Follows one step commands inconsistently, Follows one step commands with increased time Safety/Judgement: Decreased awareness of deficits, Decreased awareness of safety Awareness: Intellectual Problem Solving: Slow processing, Decreased initiation, Difficulty sequencing, Requires verbal cues, Requires tactile cues       General Comments  VSS on RA, caregiver in room    Exercises     Shoulder Instructions      Home  Living Family/patient expects to be discharged to:: Private residence Living Arrangements: Children Available Help at Discharge: Family;Personal care attendant;Available 24 hours/day Type of Home: House Home Access: Ramped entrance     Home Layout: Two level;Able to live on main level with bedroom/bathroom     Bathroom Shower/Tub: Other (comment)         Home Equipment: Rolling Walker (2 wheels);Toilet riser;Shower seat - built in;Grab bars - tub/shower;Grab bars - toilet;Transport chair (adjustable bed)          Prior Functioning/Environment Prior Level of Function : Needs assist             Mobility Comments: Moves very slowly at home, using the RW ADLs Comments: Caregivers assist with all ADL's; present 24/7        OT Problem List: Decreased strength;Decreased range of motion;Decreased activity tolerance;Impaired balance (sitting and/or standing);Decreased cognition;Decreased coordination;Decreased safety awareness      OT Treatment/Interventions: Self-care/ADL training;Therapeutic exercise;Energy conservation;DME and/or AE instruction;Therapeutic activities;Cognitive remediation/compensation;Visual/perceptual remediation/compensation;Patient/family education;Balance training    OT Goals(Current goals can be found in the care plan section) Acute Rehab OT Goals Patient Stated Goal: none stated OT Goal Formulation: With patient Time For Goal Achievement: 10/22/21 Potential to Achieve Goals: Good  OT Frequency: Min 2X/week    Co-evaluation PT/OT/SLP Co-Evaluation/Treatment: Yes Reason for Co-Treatment: Complexity of the patient's impairments (multi-system involvement);For patient/therapist safety;To address functional/ADL transfers          AM-PAC OT "6 Clicks" Daily Activity     Outcome Measure Help from another person eating meals?: A Little Help from another person taking care of personal grooming?: A Little Help from another person toileting,  which  includes using toliet, bedpan, or urinal?: A Little Help from another person bathing (including washing, rinsing, drying)?: A Lot Help from another person to put on and taking off regular upper body clothing?: A Little Help from another person to put on and taking off regular lower body clothing?: A Lot 6 Click Score: 16   End of Session Equipment Utilized During Treatment: Gait belt;Rolling walker (2 wheels) Nurse Communication: Mobility status  Activity Tolerance: Patient tolerated treatment well Patient left: in chair;with call bell/phone within reach;with chair alarm set;with family/visitor present  OT Visit Diagnosis: Unsteadiness on feet (R26.81);Other abnormalities of gait and mobility (R26.89);Muscle weakness (generalized) (M62.81)                Time: 0539-7673 OT Time Calculation (min): 28 min Charges:  OT General Charges $OT Visit: 1 Visit OT Evaluation $OT Eval Moderate Complexity: 1 [address] OTD, OTR/L Acute Rehab [phone]) [phone]  Mayer Masker 10/08/2021, 3:22 PM

## 2021-10-08 NOTE — Progress Notes (Signed)
VASCULAR LAB    Carotid duplex has been performed.  See CV proc for preliminary results.   Phyllis Abelson, RVT 10/08/2021, 10:10 AM

## 2021-10-08 NOTE — Evaluation (Signed)
Physical Therapy Evaluation  Patient Details Name: Jessica Hawkins MRN: [id number] DOB: 1922/09/20 Today's Date: 10/08/2021  History of Present Illness  Pt is a 88 y/o female who presents 10/07/2021 with chills and rigors. 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 PMH significant for R BBB, CKD, HTN, hypothyroidism, OA, osteoporosis, DM II, memory deficits, B IM nail, ORIF R hip 2002, L rotator cuff repair 1999, B THR 2017/ 2018.   Clinical Impression  Pt admitted with above diagnosis. Pt currently with functional limitations due to the deficits listed below (see PT Problem List). At the time of PT eval pt was able to perform transfers with +2 mod-max assist for balance support, safety, walker management, and frequent cues. Caregiver present and reports pt is near baseline of function. Recommending HH PT follow up at d/c to maximize functional independence, decrease caregiver burden, and decrease risk for falls at home. Acutely, pt will benefit from skilled PT to increase their independence and safety with mobility to allow discharge to the venue listed below.          Recommendations for follow up therapy are one component of a multi-disciplinary discharge planning process, led by the attending physician.  Recommendations may be updated based on patient status, additional functional criteria and insurance authorization.  Follow Up Recommendations Home health PT      Assistance Recommended at Discharge Frequent or constant Supervision/Assistance  Patient can return home with the following  Two people to help with walking and/or transfers;Two people to help with bathing/dressing/bathroom;Assistance with cooking/housework;Direct supervision/assist for medications management;Help with stairs or ramp for entrance;Assist for transportation    Equipment Recommendations None recommended by PT  Recommendations for Other Services       Functional Status Assessment Patient has had a recent  decline in their functional status and demonstrates the ability to make significant improvements in function in a reasonable and predictable amount of time.     Precautions / Restrictions Precautions Precautions: Fall Precaution Comments: Heavy posterior bias initially upon stand. Restrictions Weight Bearing Restrictions: No      Mobility  Bed Mobility Overal bed mobility: Needs Assistance Bed Mobility: Supine to Sit     Supine to sit: Mod assist, +2 for physical assistance     General bed mobility comments: VC's for use of railing. Assist for LE advancement towards EOB and bed pad utilized for transition to full sitting position. Increased time to scoot out and get feet on the floor.    Transfers Overall transfer level: Needs assistance Equipment used: Rolling walker (2 wheels) Transfers: Sit to/from Stand, Bed to chair/wheelchair/BSC Sit to Stand: Max assist, +2 physical assistance   Step pivot transfers: Mod assist, +2 physical assistance, +2 safety/equipment       General transfer comment: Heavy +2 assist to power up to full stand. Posterior bias, and increased time to gain/maintain standing balance. +2 mod assist for transition to recliner for balance support, safety, cues, and walker management.    Ambulation/Gait               General Gait Details: Not assessed this session.  Stairs            Wheelchair Mobility    Modified Rankin (Stroke Patients Only) Modified Rankin (Stroke Patients Only) Pre-Morbid Rankin Score: Moderately severe disability Modified Rankin: Moderately severe disability     Balance Overall balance assessment: Needs assistance Sitting-balance support: Feet supported, Bilateral upper extremity supported Sitting balance-Leahy Scale: Poor  Standing balance support: Bilateral upper extremity supported, During functional activity, Reliant on assistive device for balance Standing balance-Leahy Scale: Zero Standing balance  comment: +2 assist required                             Pertinent Vitals/Pain Pain Assessment Pain Assessment: No/denies pain    Home Living Family/patient expects to be discharged to:: Private residence Living Arrangements: Children Available Help at Discharge: Family;Personal care attendant;Available 24 hours/day Type of Home: House Home Access: Ramped entrance       Home Layout: Two level;Able to live on main level with bedroom/bathroom Home Equipment: Rolling Walker (2 wheels);Toilet riser;Shower seat - built in;Grab bars - tub/shower;Grab bars - toilet;Transport chair (Adjustable bed)      Prior Function Prior Level of Function : Needs assist             Mobility Comments: Moves very slowly at home, using the RW ADLs Comments: Caregivers assist with all ADL's     Hand Dominance   Dominant Hand: Right    Extremity/Trunk Assessment   Upper Extremity Assessment Upper Extremity Assessment: Defer to OT evaluation    Lower Extremity Assessment Lower Extremity Assessment: Generalized weakness RLE Deficits / Details: Bilaterally pt with good strength during MMT but during OOB mobility pt demonstrating quick muscular fatigue.    Cervical / Trunk Assessment Cervical / Trunk Assessment: Kyphotic  Communication   Communication: HOH  Cognition Arousal/Alertness: Awake/alert Behavior During Therapy: Flat affect Overall Cognitive Status: History of cognitive impairments - at baseline Area of Impairment: Orientation, Attention, Memory, Following commands, Safety/judgement, Awareness, Problem solving                 Orientation Level: Disoriented to, Place, Time, Situation Current Attention Level: Focused Memory: Decreased short-term memory Following Commands: Follows one step commands inconsistently, Follows one step commands with increased time Safety/Judgement: Decreased awareness of deficits, Decreased awareness of safety Awareness:  Intellectual Problem Solving: Slow processing, Decreased initiation, Difficulty sequencing, Requires verbal cues, Requires tactile cues          General Comments      Exercises     Assessment/Plan    PT Assessment Patient needs continued PT services  PT Problem List Decreased strength;Decreased activity tolerance;Decreased balance;Decreased mobility;Decreased cognition;Decreased knowledge of use of DME;Decreased safety awareness;Decreased knowledge of precautions;Decreased coordination       PT Treatment Interventions DME instruction;Gait training;Functional mobility training;Therapeutic exercise;Therapeutic activities;Balance training;Neuromuscular re-education;Cognitive remediation;Patient/family education    PT Goals (Current goals can be found in the Care Plan section)  Acute Rehab PT Goals Patient Stated Goal: None stated. PT Goal Formulation: Patient unable to participate in goal setting Time For Goal Achievement: 10/22/21 Potential to Achieve Goals: Good    Frequency Min 3X/week     Co-evaluation               AM-PAC PT "6 Clicks" Mobility  Outcome Measure Help needed turning from your back to your side while in a flat bed without using bedrails?: A Lot Help needed moving from lying on your back to sitting on the side of a flat bed without using bedrails?: Total Help needed moving to and from a bed to a chair (including a wheelchair)?: Total Help needed standing up from a chair using your arms (e.g., wheelchair or bedside chair)?: Total Help needed to walk in hospital room?: Total Help needed climbing 3-5 steps with a railing? : Total 6 Click Score: 7  End of Session Equipment Utilized During Treatment: Gait belt Activity Tolerance: Patient tolerated treatment well Patient left: in chair;with call bell/phone within reach;with family/visitor present Nurse Communication: Mobility status PT Visit Diagnosis: Unsteadiness on feet (R26.81);Muscle weakness  (generalized) (M62.81);Other symptoms and signs involving the nervous system (R29.898)    Time: 4492-0100 PT Time Calculation (min) (ACUTE ONLY): 29 min   Charges:   PT Evaluation $PT Eval Moderate Complexity: 1 Mod          Conni Slipper, PT, DPT Acute Rehabilitation Services Secure Chat Preferred Office: [phone]   Marylynn Pearson 10/08/2021, 3:12 PM

## 2021-10-08 NOTE — Progress Notes (Signed)
PHARMACY - PHYSICIAN COMMUNICATION CRITICAL VALUE ALERT - BLOOD CULTURE IDENTIFICATION (BCID)  Jessica Hawkins is an 88 y.o. female who presented to [facility] on 10/07/2021 with a chief complaint of stroke and fever.  Assessment:  Pt with pseudomonas bacteremia (cellulitis suspected source)  Name of physician (or Provider) Contacted: Dr. Jerral Ralph  Current antibiotics: Cefepime and Vancomycin  Changes to prescribed antibiotics recommended:  D/c Vancomycin. Continue the Cefepime  Results for orders placed or performed during the hospital encounter of 10/07/21  Blood Culture ID Panel (Reflexed) (Collected: 10/07/2021  8:40 PM)  Result Value Ref Range   Enterococcus faecalis NOT DETECTED NOT DETECTED   Enterococcus Faecium NOT DETECTED NOT DETECTED   Listeria monocytogenes NOT DETECTED NOT DETECTED   Staphylococcus species NOT DETECTED NOT DETECTED   Staphylococcus aureus (BCID) NOT DETECTED NOT DETECTED   Staphylococcus epidermidis NOT DETECTED NOT DETECTED   Staphylococcus lugdunensis NOT DETECTED NOT DETECTED   Streptococcus species NOT DETECTED NOT DETECTED   Streptococcus agalactiae NOT DETECTED NOT DETECTED   Streptococcus pneumoniae NOT DETECTED NOT DETECTED   Streptococcus pyogenes NOT DETECTED NOT DETECTED   A.calcoaceticus-baumannii NOT DETECTED NOT DETECTED   Bacteroides fragilis NOT DETECTED NOT DETECTED   Enterobacterales NOT DETECTED NOT DETECTED   Enterobacter cloacae complex NOT DETECTED NOT DETECTED   Escherichia coli NOT DETECTED NOT DETECTED   Klebsiella aerogenes NOT DETECTED NOT DETECTED   Klebsiella oxytoca NOT DETECTED NOT DETECTED   Klebsiella pneumoniae NOT DETECTED NOT DETECTED   Proteus species NOT DETECTED NOT DETECTED   Salmonella species NOT DETECTED NOT DETECTED   Serratia marcescens NOT DETECTED NOT DETECTED   Haemophilus influenzae NOT DETECTED NOT DETECTED   Neisseria meningitidis NOT DETECTED NOT DETECTED   Pseudomonas aeruginosa DETECTED (A) NOT  DETECTED   Stenotrophomonas maltophilia NOT DETECTED NOT DETECTED   Candida albicans NOT DETECTED NOT DETECTED   Candida auris NOT DETECTED NOT DETECTED   Candida glabrata NOT DETECTED NOT DETECTED   Candida krusei NOT DETECTED NOT DETECTED   Candida parapsilosis NOT DETECTED NOT DETECTED   Candida tropicalis NOT DETECTED NOT DETECTED   Cryptococcus neoformans/gattii NOT DETECTED NOT DETECTED   CTX-M ESBL NOT DETECTED NOT DETECTED   Carbapenem resistance IMP NOT DETECTED NOT DETECTED   Carbapenem resistance KPC NOT DETECTED NOT DETECTED   Carbapenem resistance NDM NOT DETECTED NOT DETECTED   Carbapenem resistance VIM NOT DETECTED NOT DETECTED    Christoper Fabian, PharmD, BCPS Please see amion for complete clinical pharmacist phone list 10/08/2021  6:07 PM

## 2021-10-08 NOTE — Progress Notes (Signed)
PT Cancellation Note  Patient Details Name: Jessica Hawkins MRN: [id number] DOB: 1922/03/14   Cancelled Treatment:    Reason Eval/Treat Not Completed: Patient at procedure or test/unavailable. Pt currently off unit for testing. Will check back as schedule allows to initiate PT evaluation.    Marylynn Pearson 10/08/2021, 9:35 AM  Conni Slipper, PT, DPT Acute Rehabilitation Services Secure Chat Preferred Office: [phone]

## 2021-10-08 NOTE — Progress Notes (Addendum)
STROKE TEAM PROGRESS NOTE   SUBJECTIVE (INTERVAL HISTORY) Her daughter and caregiver are at the bedside.  Overall her condition is stable.  Patient is awake alert, orientated to place and people, no focal neurologic deficits seen.  MRI showed right cerebellum small cortical infarct, and old left frontal infarct.  Febrile on admission Temp 102.2.  Put on cefepime and vancomycin, fever resolved.  Blood culture pending.   OBJECTIVE Temp:  [97.8 F (36.6 C)-102.2 F (39 C)] 98.6 F (37 C) (08/07 1235) Pulse Rate:  [70-109] 70 (08/07 1235) Cardiac Rhythm: Normal sinus rhythm;Bundle branch block (08/07 0700) Resp:  [16-26] 18 (08/07 1235) BP: (102-143)/(46-74) 112/46 (08/07 1235) SpO2:  [96 %-100 %] 99 % (08/07 1235) Weight:  [54.4 kg] 54.4 kg (08/07 0135)  No results for input(s): "GLUCAP" in the last 168 hours. Recent Labs  Lab 10/07/21 2040 10/08/21 0545  NA 136 137  K 4.4 4.3  CL 101 105  CO2 26 27  GLUCOSE 149* 130*  BUN 53* 45*  CREATININE 1.25* 1.08*  CALCIUM 10.2 9.1   Recent Labs  Lab 10/07/21 2040 10/08/21 0545  AST 33 26  ALT 20 17  ALKPHOS 73 53  BILITOT 0.8 1.0  PROT 7.1 6.2*  ALBUMIN 3.8 2.9*   Recent Labs  Lab 10/07/21 2040 10/08/21 0545  WBC 17.2* 20.3*  NEUTROABS 15.9*  --   HGB 11.8* 10.4*  HCT 36.5 33.4*  MCV 85.5 86.3  PLT 219 189   No results for input(s): "CKTOTAL", "CKMB", "CKMBINDEX", "TROPONINI" in the last 168 hours. No results for input(s): "LABPROT", "INR" in the last 72 hours. Recent Labs    10/07/21 2022  COLORURINE YELLOW  LABSPEC 1.013  PHURINE 6.0  GLUCOSEU NEGATIVE  HGBUR MODERATE*  BILIRUBINUR NEGATIVE  KETONESUR NEGATIVE  PROTEINUR 100*  NITRITE NEGATIVE  LEUKOCYTESUR NEGATIVE       Component Value Date/Time   CHOL 154 10/08/2021 0545   CHOL 143 05/05/2014 1037   TRIG 26 10/08/2021 0545   HDL 67 10/08/2021 0545   HDL 72 05/05/2014 1037   CHOLHDL 2.3 10/08/2021 0545   VLDL 5 10/08/2021 0545   LDLCALC 82  10/08/2021 0545   LDLCALC 65 05/05/2014 1037   Lab Results  Component Value Date   HGBA1C 5.7 (H) 10/08/2021      Component Value Date/Time   LABOPIA NONE DETECTED 06/29/2011 1441   COCAINSCRNUR NONE DETECTED 06/29/2011 1441   LABBENZ NONE DETECTED 06/29/2011 1441   AMPHETMU NONE DETECTED 06/29/2011 1441   THCU NONE DETECTED 06/29/2011 1441   LABBARB NONE DETECTED 06/29/2011 1441    No results for input(s): "ETH" in the last 168 hours.  I have personally reviewed the radiological images below and agree with the radiology interpretations.  VAS [place] LOWER EXTREMITY VENOUS (DVT)  Result Date: 10/08/2021  Lower Venous DVT Study Patient Name:  ALUNA WHISTON  Date of Exam:   10/08/2021 Medical Rec #: [id number]        Accession #:    [id number] Date of Birth: [birth year]        Patient Gender: F Patient Age:   44 years Exam Location:  [facility] Procedure:      VAS [place] LOWER EXTREMITY VENOUS (DVT) Referring Phys: Scheryl Marten Ceaira Ernster --------------------------------------------------------------------------------  Indications: Stroke.  Limitations: Altered mental status, unable to position patient's legs adequately and body habitus. Comparison Study: Prior negative left LEV done 03/23/18 Performing Technologist: Sherren Kerns RVS  Examination Guidelines: A complete evaluation includes B-mode imaging, spectral  Doppler, color Doppler, and power Doppler as needed of all accessible portions of each vessel. Bilateral testing is considered an integral part of a complete examination. Limited examinations for reoccurring indications may be performed as noted. The reflux portion of the exam is performed with the patient in reverse Trendelenburg.  +---------+---------------+---------+-----------+----------+-------------------+ RIGHT    CompressibilityPhasicitySpontaneityPropertiesThrombus Aging      +---------+---------------+---------+-----------+----------+-------------------+ CFV      Full           Yes       Yes                                      +---------+---------------+---------+-----------+----------+-------------------+ SFJ      Full                                                             +---------+---------------+---------+-----------+----------+-------------------+ FV Prox  Full                                                             +---------+---------------+---------+-----------+----------+-------------------+ FV Mid   Full                                                             +---------+---------------+---------+-----------+----------+-------------------+ FV DistalFull                                                             +---------+---------------+---------+-----------+----------+-------------------+ PFV      Full                                                             +---------+---------------+---------+-----------+----------+-------------------+ POP                     Yes      Yes                  patent by color and                                                       Doppler             +---------+---------------+---------+-----------+----------+-------------------+ PTV  Not well visualized +---------+---------------+---------+-----------+----------+-------------------+ PERO                                                  Not well visualized +---------+---------------+---------+-----------+----------+-------------------+   +---------+---------------+---------+-----------+----------+-------------------+ LEFT     CompressibilityPhasicitySpontaneityPropertiesThrombus Aging      +---------+---------------+---------+-----------+----------+-------------------+ CFV      Full                                                             +---------+---------------+---------+-----------+----------+-------------------+ SFJ      Full                                                              +---------+---------------+---------+-----------+----------+-------------------+ FV Prox  Full           Yes      Yes                                      +---------+---------------+---------+-----------+----------+-------------------+ FV Mid   Full                                                             +---------+---------------+---------+-----------+----------+-------------------+ FV DistalFull                                                             +---------+---------------+---------+-----------+----------+-------------------+ PFV      Full           Yes      Yes                                      +---------+---------------+---------+-----------+----------+-------------------+ POP                                                   patent by color and                                                       Doppler             +---------+---------------+---------+-----------+----------+-------------------+ PTV  Not well visualized +---------+---------------+---------+-----------+----------+-------------------+ PERO                                                  Not well visualized +---------+---------------+---------+-----------+----------+-------------------+     Summary: RIGHT: - There is no evidence of deep vein thrombosis in the lower extremity. However, portions of this examination were limited- see technologist comments above.  LEFT: - There is no evidence of deep vein thrombosis in the lower extremity. However, portions of this examination were limited- see technologist comments above.  *See table(s) above for measurements and observations. Electronically signed by Servando Snare MD on 10/08/2021 at 1:35:46 PM.    Final    VAS US CAROTID  Result Date: 10/08/2021 Carotid Arterial Duplex Study Patient Name:  UZMA TROLLINGER  Date of Exam:   10/08/2021  Medical Rec #: [id number]        Accession #:    [id number] Date of Birth: June 10, 1922        Patient Gender: F Patient Age:   65 years Exam Location:  [facility] Procedure:      VAS US CAROTID Referring Phys: Gean Birchwood --------------------------------------------------------------------------------  Indications:      CVA and Speech disturbance. Risk Factors:     Hypertension, Diabetes. Other Factors:    Dementia, CKD. Limitations       Today's exam was limited due to altered mental status,                   tortuosity. Comparison Study: No prior study on file Performing Technologist: Sharion Dove RVS  Examination Guidelines: A complete evaluation includes B-mode imaging, spectral Doppler, color Doppler, and power Doppler as needed of all accessible portions of each vessel. Bilateral testing is considered an integral part of a complete examination. Limited examinations for reoccurring indications may be performed as noted.  Right Carotid Findings: +----------+--------+--------+--------+------------------+------------------+           PSV cm/sEDV cm/sStenosisPlaque DescriptionComments           +----------+--------+--------+--------+------------------+------------------+ CCA Prox  24      3                                 intimal thickening +----------+--------+--------+--------+------------------+------------------+ CCA Distal40      6                                 intimal thickening +----------+--------+--------+--------+------------------+------------------+ ICA Prox  53      13              heterogenous                         +----------+--------+--------+--------+------------------+------------------+ ICA Mid   71      8                                 tortuous           +----------+--------+--------+--------+------------------+------------------+ ICA Distal70      12  tortuous            +----------+--------+--------+--------+------------------+------------------+ ECA       63      9                                                    +----------+--------+--------+--------+------------------+------------------+ +----------+--------+-------+--------+-------------------+           PSV cm/sEDV cmsDescribeArm Pressure (mmHG) +----------+--------+-------+--------+-------------------+ Subclavian50                                         +----------+--------+-------+--------+-------------------+ +---------+--------+--+--------+-+ VertebralPSV cm/s41EDV cm/s1 +---------+--------+--+--------+-+  Left Carotid Findings: +----------+--------+--------+--------+------------------+------------------+           PSV cm/sEDV cm/sStenosisPlaque DescriptionComments           +----------+--------+--------+--------+------------------+------------------+ CCA Prox  50      4                                 intimal thickening +----------+--------+--------+--------+------------------+------------------+ CCA Distal50      12                                intimal thickening +----------+--------+--------+--------+------------------+------------------+ ICA Prox  44      8               heterogenous      tortuous           +----------+--------+--------+--------+------------------+------------------+ ICA Mid   42      7                                 tortuous           +----------+--------+--------+--------+------------------+------------------+ ICA Distal44      5                                 tortuous           +----------+--------+--------+--------+------------------+------------------+ ECA       65      4                                                    +----------+--------+--------+--------+------------------+------------------+ +----------+--------+--------+--------+-------------------+           PSV cm/sEDV cm/sDescribeArm Pressure (mmHG)  +----------+--------+--------+--------+-------------------+ [id number]                                          +----------+--------+--------+--------+-------------------+ +---------+--------+--+--------+-+ VertebralPSV cm/s48EDV cm/s4 +---------+--------+--+--------+-+   Summary: Right Carotid: The extracranial vessels were near-normal with only minimal wall                thickening or plaque. Left Carotid: The extracranial vessels were near-normal with only minimal wall               thickening or  plaque. Vertebrals:  Bilateral vertebral arteries demonstrate antegrade flow. Subclavians: Normal flow hemodynamics were seen in bilateral subclavian              arteries. *See table(s) above for measurements and observations.     Preliminary    ECHOCARDIOGRAM COMPLETE  Result Date: 10/08/2021    ECHOCARDIOGRAM REPORT   Patient Name:   MARSI TURVEY Date of Exam: 10/08/2021 Medical Rec #:  [id number]       Height:       56.3 in Accession #:    [id number]      Weight:       120.0 lb Date of Birth:  Jul 29, 1922       BSA:          1.434 m Patient Age:    89 years        BP:           133/68 mmHg Patient Gender: F               HR:           75 bpm. Exam Location:  Inpatient Procedure: 2D Echo Indications:    stroke  History:        Patient has no prior history of Echocardiogram examinations.                 Chronic kidney disease, Signs/Symptoms:Fever; Risk                 Factors:Hypertension, Dyslipidemia and Diabetes.  Sonographer:    Delcie Roch RDCS Sonographer#2:  Aron Baba Referring Phys: [id number] Eduard Clos IMPRESSIONS  1. Left ventricular ejection fraction, by estimation, is 65 to 70%. The left ventricle has normal function. The left ventricle has no regional wall motion abnormalities. Left ventricular diastolic parameters are consistent with Grade I diastolic dysfunction (impaired relaxation).  2. Right ventricular systolic function is normal. The right ventricular size is normal.  There is mildly elevated pulmonary artery systolic pressure. The estimated right ventricular systolic pressure is 44.0 mmHg.  3. Left atrial size was moderately dilated.  4. Right atrial size was mildly dilated.  5. The mitral valve is normal in structure. No evidence of mitral valve regurgitation. No evidence of mitral stenosis.  6. Tricuspid valve regurgitation is moderate.  7. The aortic valve is tricuspid. There is moderate calcification of the aortic valve. There is moderate thickening of the aortic valve. Aortic valve regurgitation is moderate. Aortic valve sclerosis is present, with no evidence of aortic valve stenosis.  8. The inferior vena cava is dilated in size with >50% respiratory variability, suggesting right atrial pressure of 8 mmHg. Conclusion(s)/Recommendation(s): No intracardiac source of embolism detected on this transthoracic study. Consider a transesophageal echocardiogram to exclude cardiac source of embolism if clinically indicated. FINDINGS  Left Ventricle: Left ventricular ejection fraction, by estimation, is 65 to 70%. The left ventricle has normal function. The left ventricle has no regional wall motion abnormalities. The left ventricular internal cavity size was normal in size. There is  no left ventricular hypertrophy. Left ventricular diastolic parameters are consistent with Grade I diastolic dysfunction (impaired relaxation). Right Ventricle: The right ventricular size is normal. No increase in right ventricular wall thickness. Right ventricular systolic function is normal. There is mildly elevated pulmonary artery systolic pressure. The tricuspid regurgitant velocity is 3.00  m/s, and with an assumed right atrial pressure of 8 mmHg, the estimated right ventricular systolic pressure is 44.0 mmHg. Left Atrium: Left atrial size  was moderately dilated. Right Atrium: Right atrial size was mildly dilated. Pericardium: There is no evidence of pericardial effusion. Mitral Valve: The mitral  valve is normal in structure. No evidence of mitral valve regurgitation. No evidence of mitral valve stenosis. Tricuspid Valve: The tricuspid valve is normal in structure. Tricuspid valve regurgitation is moderate . No evidence of tricuspid stenosis. Aortic Valve: The aortic valve is tricuspid. There is moderate calcification of the aortic valve. There is moderate thickening of the aortic valve. Aortic valve regurgitation is moderate. Aortic regurgitation PHT measures 318 msec. Aortic valve sclerosis  is present, with no evidence of aortic valve stenosis. Aortic valve mean gradient measures 11.0 mmHg. Aortic valve peak gradient measures 19.8 mmHg. Aortic valve area, by VTI measures 2.38 cm. Pulmonic Valve: The pulmonic valve was normal in structure. Pulmonic valve regurgitation is mild. No evidence of pulmonic stenosis. Aorta: The aortic root is normal in size and structure. Venous: The inferior vena cava is dilated in size with greater than 50% respiratory variability, suggesting right atrial pressure of 8 mmHg. IAS/Shunts: No atrial level shunt detected by color flow Doppler.  LEFT VENTRICLE PLAX 2D LVIDd:         4.50 cm   Diastology LVIDs:         3.00 cm   LV e' medial:    5.55 cm/s LV PW:         1.00 cm   LV E/e' medial:  17.8 LV IVS:        0.90 cm   LV e' lateral:   8.38 cm/s LVOT diam:     2.00 cm   LV E/e' lateral: 11.8 LV SV:         94 LV SV Index:   66 LVOT Area:     3.14 cm  RIGHT VENTRICLE             IVC RV Basal diam:  2.80 cm     IVC diam: 2.00 cm RV S prime:     14.50 cm/s TAPSE (M-mode): 2.9 cm LEFT ATRIUM             Index        RIGHT ATRIUM           Index LA diam:        3.20 cm 2.23 cm/m   RA Area:     15.90 cm LA Vol (A2C):   68.3 ml 47.63 ml/m  RA Volume:   35.90 ml  25.04 ml/m LA Vol (A4C):   50.3 ml 35.08 ml/m LA Biplane Vol: 62.8 ml 43.80 ml/m  AORTIC VALVE AV Area (Vmax):    2.15 cm AV Area (Vmean):   2.04 cm AV Area (VTI):     2.38 cm AV Vmax:           222.50 cm/s AV  Vmean:          151.000 cm/s AV VTI:            0.395 m AV Peak Grad:      19.8 mmHg AV Mean Grad:      11.0 mmHg LVOT Vmax:         152.00 cm/s LVOT Vmean:        98.100 cm/s LVOT VTI:          0.299 m LVOT/AV VTI ratio: 0.76 AI PHT:            318 msec  AORTA Ao Asc diam: 3.20 cm MITRAL VALVE  TRICUSPID VALVE MV Area (PHT): 4.57 cm     TR Peak grad:   36.0 mmHg MV Decel Time: 166 msec     TR Vmax:        300.00 cm/s MV E velocity: 99.00 cm/s MV A velocity: 119.00 cm/s  SHUNTS MV E/A ratio:  0.83         Systemic VTI:  0.30 m                             Systemic Diam: 2.00 cm Candee Furbish MD Electronically signed by Candee Furbish MD Signature Date/Time: 10/08/2021/12:07:00 PM    Final    MR ANGIO HEAD WO CONTRAST  Result Date: 10/08/2021 CLINICAL DATA:  Acute neurologic deficit EXAM: MRA HEAD WITHOUT CONTRAST TECHNIQUE: Angiographic images of the Circle of Willis were acquired using MRA technique without intravenous contrast. COMPARISON:  None Available. FINDINGS: POSTERIOR CIRCULATION: --Vertebral arteries: Normal --Inferior cerebellar arteries: Normal. --Basilar artery: Normal. --Superior cerebellar arteries: Normal. --Posterior cerebral arteries: Normal. ANTERIOR CIRCULATION: --Intracranial internal carotid arteries: Normal. --Anterior cerebral arteries (ACA): Normal. --Middle cerebral arteries (MCA): Normal. ANATOMIC VARIANTS: Both P comms are patent. IMPRESSION: Normal intracranial MRA. Electronically Signed   By: Ulyses Jarred M.D.   On: 10/08/2021 02:58   MR BRAIN WO CONTRAST  Result Date: 10/07/2021 CLINICAL DATA:  Aphasia EXAM: MRI HEAD WITHOUT CONTRAST TECHNIQUE: Multiplanar, multiecho pulse sequences of the brain and surrounding structures were obtained without intravenous contrast. COMPARISON:  04/25/2017 FINDINGS: Brain: Small focus of acute ischemia in the right cerebellar hemisphere. Old left frontal infarct. Chronic hemo siderosis at the site of the left frontal remote infarct. There  is multifocal hyperintense T2-weighted signal within the white matter. Generalized volume loss. The midline structures are normal. Vascular: Major flow voids are preserved. Skull and upper cervical spine: Normal calvarium and skull base. Visualized upper cervical spine and soft tissues are normal. Sinuses/Orbits:No paranasal sinus fluid levels or advanced mucosal thickening. No mastoid or middle ear effusion. Normal orbits. IMPRESSION: 1. Small focus of acute ischemia in the right cerebellar hemisphere. No hemorrhage or mass effect. 2. 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 Electronically Signed   By: Ulyses Jarred M.D.   On: 10/07/2021 23:32   CT HEAD WO CONTRAST (5MM)  Result Date: 10/07/2021 CLINICAL DATA:  Mental status change. EXAM: CT HEAD WITHOUT CONTRAST TECHNIQUE: Contiguous axial images were obtained from the base of the skull through the vertex without intravenous contrast. RADIATION DOSE REDUCTION: This exam was performed according to the departmental dose-optimization program which includes automated exposure control, adjustment of the mA and/or kV according to patient size and/or use of iterative reconstruction technique. COMPARISON:  None Available. FINDINGS: Brain: Area of hypoattenuation in the left frontoparietal region spends the cortex and the subcortical white matter no evidence of hydrocephalus or mass effect. Mild brain parenchymal volume loss and deep white matter microangiopathy for age. Vascular: No hyperdense vessel or unexpected calcification. Skull: Normal. Negative for fracture or focal lesion. Sinuses/Orbits: No acute finding. Other: None. IMPRESSION: 1. Area of hypoattenuation in the left frontoparietal region spends the cortex and the subcortical white matter, which may represent an acute or subacute infarct. Further evaluation with MRI of the brain may be considered, if found clinically appropriate. 2. Mild brain parenchymal volume loss and  deep white matter microangiopathy for age. Electronically Signed   By: Fidela Salisbury M.D.   On: 10/07/2021 21:09   DG Chest  Port 1 View  Result Date: 10/07/2021 CLINICAL DATA:  Altered mental status EXAM: PORTABLE CHEST 1 VIEW COMPARISON:  05/05/2021 FINDINGS: Heart is borderline in size. Aortic atherosclerosis. No confluent airspace opacities or effusions. No acute bony abnormality. IMPRESSION: No active disease. Electronically Signed   By: Rolm Baptise M.D.   On: 10/07/2021 21:00     PHYSICAL EXAM  Temp:  [97.8 F (36.6 C)-102.2 F (39 C)] 98.6 F (37 C) (08/07 1235) Pulse Rate:  [70-109] 70 (08/07 1235) Resp:  [16-26] 18 (08/07 1235) BP: (102-143)/(46-74) 112/46 (08/07 1235) SpO2:  [96 %-100 %] 99 % (08/07 1235) Weight:  [54.4 kg] 54.4 kg (08/07 0135)  General - Well nourished, well developed, in no apparent distress.  Ophthalmologic - fundi not visualized due to noncooperation.  Cardiovascular - Regular rhythm and rate.  Neuro - awake, alert, eyes open, orientated to people and place, not orientated to time or age. No aphasia, paucity of speech, hard of hearing but answer questions appropriately, following all simple commands. Able to name 2/3 and repeat simple sentences. No gaze palsy, tracking bilaterally, visual field full. No facial droop. Tongue midline. Bilateral UEs 4/5, no drift. Bilaterally LEs 4/5, no drift. Sensation symmetrical bilaterally, b/l FTN intact grossly, gait not tested.     ASSESSMENT/PLAN Ms. DEEPA MORPHIS is a 88 y.o. female with history of hypertension, hyperlipidemia, diabetes, dementia, OSA admitted for fever with shivering and tremors. No tPA given due to outside window.    Stroke: Right acute small subdural cortical infarct, embolic pattern, etiology unclear  CT no acute abnormality, old left frontal infarct MRI right small cerebellum cortical infarct, old left frontal infarct MRA unremarkable Carotid Doppler unremarkable EEG pending 2D  Echo EF 65 to 70% LE venous Doppler negative for DVT Recommend 30-day CardioNet monitoring to rule out A-fib as outpatient If blood culture positive for bacteremia, will recommend TEE to rule out endocarditis LDL 82 HgbA1c 5.7 Lovenox for VTE prophylaxis aspirin 81 mg daily prior to admission, now on aspirin 81 mg daily and clopidogrel 75 mg daily DAPT for 3 weeks and then plavix alone. Patient counseled to be compliant with her antithrombotic medications Ongoing aggressive stroke risk factor management Therapy recommendations: Pending Disposition: Pending  Fever Leukocytosis Temp 102.2-> afebrile WBC 17.2-20.3 On cefepime and vancomycin UA negative CXR negative Source unclear Blood culture pending  Diabetes HgbA1c 5.7 goal < 7.0 Controlled CBG monitoring SSI DM education and close PCP follow up  Hypertension Stable Long term BP goal normotensive  Hyperlipidemia Home meds: None LDL 82, goal < 70 Now on pravastatin 20 No high intensity statin needed given advanced age and LDL near goal Continue statin at discharge  Other Stroke Risk Factors Advanced age Hx stroke/TIA on imaging Obstructive sleep apnea, on CPAP at home  Other Active Problems Dementia with home caregiver 24/7  Hospital day # 1    Rosalin Hawking, MD PhD Stroke Neurology 10/08/2021 2:33 PM    To contact Stroke Continuity provider, please refer to [email or web address] After hours, contact General Neurology

## 2021-10-08 NOTE — Progress Notes (Signed)
EEG complete - results pending 

## 2021-10-08 NOTE — Procedures (Signed)
Patient Name: Jessica Hawkins  MRN: [id number]  Epilepsy Attending: Charlsie Quest  Referring Physician/Provider: Marvel Plan, MD  Date: 10/08/2021 Duration: 22.12 mins  Patient history:  88yo F with acute small subdural cortical infarct. EEG to evaluate for seizure  Level of alertness: Awake  AEDs during EEG study: None  Technical aspects: This EEG study was done with scalp electrodes positioned according to the 10-20 International system of electrode placement. Electrical activity was reviewed with band pass filter of 1-70Hz , sensitivity of 7 uV/mm, display speed of 53mm/sec with a 60Hz  notched filter applied as appropriate. EEG data were recorded continuously and digitally stored.  Video monitoring was available and reviewed as appropriate.  Description: The posterior dominant rhythm consists of 8 Hz activity of moderate voltage (25-35 uV) seen predominantly in posterior head regions, symmetric and reactive to eye opening and eye closing. EEG showed intermittent generalized 3 to 6 Hz theta-delta slowing.  Hyperventilation and photic stimulation were not performed.     ABNORMALITY - Intermittent slow, generalized  IMPRESSION: This study is suggestive of mild diffuse encephalopathy, nonspecific etiology. No seizures or epileptiform discharges were seen throughout the recording.  [name] 

## 2021-10-08 NOTE — Progress Notes (Signed)
Pharmacy Antibiotic Note  Jessica Hawkins is a 88 y.o. female admitted on 10/07/2021 with sepsis.  Pharmacy has been consulted for vancomycin dosing.  Plan: Vancomycin 1000mg  x1 then 500mg  IV Q 36 hrs. Goal AUC 400-550.  Expected AUC 500.  Height: 4' 8.25" (142.9 cm) Weight: 54.4 kg (120 lb) IBW/kg (Calculated) : 36.88  Temp (24hrs), Avg:99.6 F (37.6 C), Min:98.2 F (36.8 C), Max:102.2 F (39 C)  Recent Labs  Lab 10/07/21 2040 10/08/21 0000  WBC 17.2*  --   CREATININE 1.25*  --   LATICACIDVEN 1.1 1.0    Estimated Creatinine Clearance: 18.2 mL/min (A) (by C-G formula based on SCr of 1.25 mg/dL (H)).    Allergies  Allergen Reactions   Penicillins Other (See Comments)    Other reaction(s): yeast infection   Thank you for allowing pharmacy to be a part of this patient's care.  12/07/21, PharmD, BCPS  10/08/2021 1:45 AM

## 2021-10-08 NOTE — Care Plan (Signed)
Patient seen and examined.  Admitted early morning hours by nighttime hospitalist.  Patient's caretaker at bedside.  Her daughter and son-in-law at the bedside.  In brief, 88 year old lives at home with family and 24/7 caretaker support, has history of hypertension, hypothyroidism, dementia, type 2 diabetes and chronic anemia brought to the ER when she started having chills and rigors yesterday.  Recently suffered from right lower leg cellulitis and was treated with doxycycline and improved.  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  Fever, source unknown.  UA normal.  Respiratory viral panel negative.  Chest x-ray normal.  Blood cultures drawn.  Started on broad-spectrum antibiotics with vancomycin and cefepime that we will continue.  Afebrile today.  Recheck WBC count tomorrow morning.  Right MCA territory stroke: Suspected small vessel disease.  Stroke workup in progress.  Currently no neurological deficits.  Mental status has improved back to her usual self. Work with PT OT.  Recommend transition home after completing stroke workup.  Anticipate discharge home tomorrow.  Family updated at the bedside.  Patient is very pleasant, answers appropriately with some delay that is anticipated from her age and debility.   Same-day admit.  No charge visit.

## 2021-10-09 DIAGNOSIS — I10 Essential (primary) hypertension: Secondary | ICD-10-CM | POA: Diagnosis not present

## 2021-10-09 DIAGNOSIS — F0390 Unspecified dementia without behavioral disturbance: Secondary | ICD-10-CM | POA: Diagnosis not present

## 2021-10-09 DIAGNOSIS — R509 Fever, unspecified: Secondary | ICD-10-CM | POA: Diagnosis not present

## 2021-10-09 DIAGNOSIS — I639 Cerebral infarction, unspecified: Secondary | ICD-10-CM | POA: Diagnosis not present

## 2021-10-09 LAB — PHOSPHORUS: Phosphorus: 2.7 mg/dL (ref 2.5–4.6)

## 2021-10-09 LAB — CBC WITH DIFFERENTIAL/PLATELET
Abs Immature Granulocytes: 0.05 10*3/uL (ref 0.00–0.07)
Basophils Absolute: 0 10*3/uL (ref 0.0–0.1)
Basophils Relative: 0 %
Eosinophils Absolute: 0 10*3/uL (ref 0.0–0.5)
Eosinophils Relative: 0 %
HCT: 30.2 % — ABNORMAL LOW (ref 36.0–46.0)
Hemoglobin: 9.8 g/dL — ABNORMAL LOW (ref 12.0–15.0)
Immature Granulocytes: 0 %
Lymphocytes Relative: 9 %
Lymphs Abs: 1.1 10*3/uL (ref 0.7–4.0)
MCH: 27.3 pg (ref 26.0–34.0)
MCHC: 32.5 g/dL (ref 30.0–36.0)
MCV: 84.1 fL (ref 80.0–100.0)
Monocytes Absolute: 0.6 10*3/uL (ref 0.1–1.0)
Monocytes Relative: 5 %
Neutro Abs: 9.7 10*3/uL — ABNORMAL HIGH (ref 1.7–7.7)
Neutrophils Relative %: 86 %
Platelets: 171 10*3/uL (ref 150–400)
RBC: 3.59 MIL/uL — ABNORMAL LOW (ref 3.87–5.11)
RDW: 14.6 % (ref 11.5–15.5)
WBC: 11.4 10*3/uL — ABNORMAL HIGH (ref 4.0–10.5)
nRBC: 0 % (ref 0.0–0.2)

## 2021-10-09 LAB — BASIC METABOLIC PANEL
Anion gap: 8 (ref 5–15)
BUN: 34 mg/dL — ABNORMAL HIGH (ref 8–23)
CO2: 24 mmol/L (ref 22–32)
Calcium: 9.1 mg/dL (ref 8.9–10.3)
Chloride: 105 mmol/L (ref 98–111)
Creatinine, Ser: 1 mg/dL (ref 0.44–1.00)
GFR, Estimated: 52 mL/min — ABNORMAL LOW (ref >60)
Glucose, Bld: 112 mg/dL — ABNORMAL HIGH (ref 70–99)
Potassium: 3.7 mmol/L (ref 3.5–5.1)
Sodium: 137 mmol/L (ref 135–145)

## 2021-10-09 LAB — MAGNESIUM: Magnesium: 1.9 mg/dL (ref 1.7–2.4)

## 2021-10-09 MED ORDER — MELATONIN 3 MG PO TABS
3.0000 mg | ORAL_TABLET | Freq: Every evening | ORAL | Status: DC | PRN
  Administered 2021-10-09 – 2021-10-10 (×2): 3 mg via ORAL
  Filled 2021-10-09 (×2): qty 1

## 2021-10-09 MED ORDER — MELATONIN 3 MG PO TABS: 3.0000 mg | ORAL_TABLET | Freq: Every day | ORAL | Status: DC

## 2021-10-09 NOTE — Progress Notes (Signed)
PROGRESS NOTE    ZADAYA RELIFORD  NWG:[id number] DOB: 07-09-1922 DOA: 10/07/2021 PCP: Merri Brunette, MD    Brief Narrative:  88 year old lives at home with family and 24/7 caretaker support, has history of hypertension, hypothyroidism, dementia, type 2 diabetes and chronic anemia brought to the ER when she started having chills and rigors yesterday.  Recently suffered from right lower leg cellulitis and was treated with doxycycline and improved.  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 Patient was febrile on presentation, found to have Pseudomonas bacteremia.  Assessment & Plan:   Pseudomonas bacteremia: UA normal.  Respiratory viral panel negative.  Chest x-ray normal.   Blood cultures on admission, Pseudomonas Blood cultures after initial antibiotics, negative so far.  Final cultures pending. Does have mild diffuse cellulitis of legs, no abscess. Initially treated with vancomycin and cefepime.  Continue cefepime. Afebrile last 24 hours.  WBC trending down. Given frailty and debility, will not pursue TEE if repeat cultures negative.  Acute right MCA territory stroke: Clinical findings, chills rigors and weakness at home. CT head findings, no acute findings.  Old left frontal infarct. MRI of the brain, small right cerebellum cortical infarct. MRA of the brain, no large vessel occlusion.  No blood clot. Carotid duplexes, no hemodynamically significant obstruction. 2D echocardiogram, normal ejection fraction.  No vegetations.  No thrombus. Antiplatelet therapy, dual antiplatelet therapy for 3 weeks and will continue Plavix alone. LDL, 82.  Low-dose statin. Hemoglobin A1c, 5.7.  No indication for treatment. With Pseudomonas bacteremia, may benefit with TEE, however TTE not showing any vegetation as well no evidence of persistent bacteremia.  Will not pursue TEE given frailty, debility and age.  If repeat cultures negative, will treat with 2 weeks of oral antibiotics on  discharge. Neurology following. PT/OT.  At home with 24/7 care.  Will resume home health care.  Essential hypertension: Blood pressure stable.       DVT prophylaxis: enoxaparin (LOVENOX) injection 30 mg Start: 10/08/21 0230   Code Status: Full code Family Communication: Daughter at the bedside.  Caregiver at the bedside. Disposition Plan: Status is: Inpatient Remains inpatient appropriate because: Bacteremia, acute to stroke     Consultants:  Neurology  Procedures:  EEG, normal  Antimicrobials:  Vancomycin 8/6-8/7 Cefepime 8/6---   Subjective: Patient seen and examined.  No overnight events.  Pleasantly confused.  Reports same words again again.  At baseline mental status as per family and caretaker. Afebrile last 24 hours.  Has some pain on the legs.  Denies any other complaints.  Objective: Vitals:   10/08/21 1545 10/08/21 2022 10/09/21 0512 10/09/21 0752  BP: (!) 141/51 (!) 119/52 (!) 137/54 (!) 123/59  Pulse: 75 85 85 75  Resp: 18 20 18    Temp: 98.4 F (36.9 C) 98.5 F (36.9 C) 98.5 F (36.9 C) 98.4 F (36.9 C)  TempSrc:  Oral Oral Oral  SpO2: 94% 100% 98% 97%  Weight:      Height:       No intake or output data in the 24 hours ending 10/09/21 1142 Filed Weights   10/08/21 0135  Weight: 54.4 kg    Examination:  General exam: Frail and debilitated.  Age appropriate.  Mostly bedbound. Respiratory system: Clear to auscultation. Respiratory effort normal.  No added sounds. Cardiovascular system: S1 & S2 heard, RRR.  Gastrointestinal system: Abdomen is nondistended, soft and nontender. No organomegaly or masses felt. Normal bowel sounds heard. Central nervous system: Alert and awake.  Oriented to herself and her  close family members.  Not oriented to time and place.  Not oriented to situation.  Can move all extremities. No focal neurological deficits. Extremities: Symmetric 5 x 5 power. Patient has mild nodular swelling on the right medial aspect of  the leg, nontender. Mild induration and superficial tenderness along the left calf, circumferential.  No fluctuation.  No redness or erythema.  Distal neurovascular status intact.    Data Reviewed: I have personally reviewed following labs and imaging studies  CBC: Recent Labs  Lab 10/07/21 2040 10/08/21 0545 10/09/21 0539  WBC 17.2* 20.3* 11.4*  NEUTROABS 15.9*  --  9.7*  HGB 11.8* 10.4* 9.8*  HCT 36.5 33.4* 30.2*  MCV 85.5 86.3 84.1  PLT 219 189 [id number]   Basic Metabolic Panel: Recent Labs  Lab 10/07/21 2040 10/08/21 0545 10/09/21 0539  NA 136 137 137  K 4.4 4.3 3.7  CL 101 105 105  CO2 26 27 24   GLUCOSE 149* 130* 112*  BUN 53* 45* 34*  CREATININE 1.25* 1.08* 1.00  CALCIUM 10.2 9.1 9.1  MG  --   --  1.9  PHOS  --   --  2.7   GFR: Estimated Creatinine Clearance: 22.8 mL/min (by C-G formula based on SCr of 1 mg/dL). Liver Function Tests: Recent Labs  Lab 10/07/21 2040 10/08/21 0545  AST 33 26  ALT 20 17  ALKPHOS 73 53  BILITOT 0.8 1.0  PROT 7.1 6.2*  ALBUMIN 3.8 2.9*   No results for input(s): "LIPASE", "AMYLASE" in the last 168 hours. No results for input(s): "AMMONIA" in the last 168 hours. Coagulation Profile: No results for input(s): "INR", "PROTIME" in the last 168 hours. Cardiac Enzymes: No results for input(s): "CKTOTAL", "CKMB", "CKMBINDEX", "TROPONINI" in the last 168 hours. BNP (last 3 results) No results for input(s): "PROBNP" in the last 8760 hours. HbA1C: Recent Labs    10/08/21 0545  HGBA1C 5.7*   CBG: No results for input(s): "GLUCAP" in the last 168 hours. Lipid Profile: Recent Labs    10/08/21 0545  CHOL 154  HDL 67  LDLCALC 82  TRIG 26  CHOLHDL 2.3   Thyroid Function Tests: No results for input(s): "TSH", "T4TOTAL", "FREET4", "T3FREE", "THYROIDAB" in the last 72 hours. Anemia Panel: No results for input(s): "VITAMINB12", "FOLATE", "FERRITIN", "TIBC", "IRON", "RETICCTPCT" in the last 72 hours. Sepsis Labs: Recent Labs   Lab 10/07/21 2040 10/08/21 0000  LATICACIDVEN 1.1 1.0    Recent Results (from the past 240 hour(s))  Urine Culture     Status: None   Collection Time: 10/07/21  8:22 PM   Specimen: In/Out Cath Urine  Result Value Ref Range Status   Specimen Description IN/OUT CATH URINE  Final   Special Requests NONE  Final   Culture   Final    NO GROWTH Performed at [facility], [place] [address] [place], [place] [address]    Report Status 10/08/2021 FINAL  Final  SARS Coronavirus 2 by RT PCR (hospital order, performed in [facility] hospital lab) *cepheid single result test* Anterior Nasal Swab     Status: None   Collection Time: 10/07/21  8:40 PM   Specimen: Anterior Nasal Swab  Result Value Ref Range Status   SARS Coronavirus 2 by RT PCR NEGATIVE NEGATIVE Final    Comment: (NOTE) SARS-CoV-2 target nucleic acids are NOT DETECTED.  The SARS-CoV-2 RNA is generally detectable in upper and lower respiratory specimens during the acute phase of infection. The lowest concentration of SARS-CoV-2 viral copies  this assay can detect is 250 copies / mL. A negative result does not preclude SARS-CoV-2 infection and should not be used as the sole basis for treatment or other patient management decisions.  A negative result may occur with improper specimen collection / handling, submission of specimen other than nasopharyngeal swab, presence of viral mutation(s) within the areas targeted by this assay, and inadequate number of viral copies (<250 copies / mL). A negative result must be combined with clinical observations, patient history, and epidemiological information.  Fact Sheet for Patients:   https://www.patel.info/  Fact Sheet for Healthcare Providers: https://hall.com/  This test is not yet approved or  cleared by the [place] FDA and has been authorized for detection and/or diagnosis of SARS-CoV-2 by FDA under an Emergency Use  Authorization (EUA).  This EUA will remain in effect (meaning this test can be used) for the duration of the COVID-19 declaration under Section 564(b)(1) of the Act, 21 U.S.C. section 360bbb-3(b)(1), unless the authorization is terminated or revoked sooner.  Performed at [facility], [place] [address], [place], [place] [address]   Blood culture (routine x 2)     Status: Abnormal (Preliminary result)   Collection Time: 10/07/21  8:40 PM   Specimen: BLOOD  Result Value Ref Range Status   Specimen Description BLOOD RIGHT ANTECUBITAL  Final   Special Requests   Final    BOTTLES DRAWN AEROBIC AND ANAEROBIC Blood Culture results may not be optimal due to an inadequate volume of blood received in culture bottles   Culture  Setup Time   Final    GRAM NEGATIVE RODS BOTTLES DRAWN AEROBIC ONLY CRITICAL RESULT CALLED TO, READ BACK BY AND VERIFIED WITH: PHARMD C. AMEND [id number] @1806  FH    Culture (A)  Final    PSEUDOMONAS AERUGINOSA SUSCEPTIBILITIES TO FOLLOW Performed at [facility], [place] [address], [place], [place] [address]    Report Status PENDING  Incomplete  Blood Culture ID Panel (Reflexed)     Status: Abnormal   Collection Time: 10/07/21  8:40 PM  Result Value Ref Range Status   Enterococcus faecalis NOT DETECTED NOT DETECTED Final   Enterococcus Faecium NOT DETECTED NOT DETECTED Final   Listeria monocytogenes NOT DETECTED NOT DETECTED Final   Staphylococcus species NOT DETECTED NOT DETECTED Final   Staphylococcus aureus (BCID) NOT DETECTED NOT DETECTED Final   Staphylococcus epidermidis NOT DETECTED NOT DETECTED Final   Staphylococcus lugdunensis NOT DETECTED NOT DETECTED Final   Streptococcus species NOT DETECTED NOT DETECTED Final   Streptococcus agalactiae NOT DETECTED NOT DETECTED Final   Streptococcus pneumoniae NOT DETECTED NOT DETECTED Final   Streptococcus pyogenes NOT DETECTED NOT DETECTED Final   A.calcoaceticus-baumannii NOT DETECTED NOT DETECTED Final    Bacteroides fragilis NOT DETECTED NOT DETECTED Final   Enterobacterales NOT DETECTED NOT DETECTED Final   Enterobacter cloacae complex NOT DETECTED NOT DETECTED Final   Escherichia coli NOT DETECTED NOT DETECTED Final   Klebsiella aerogenes NOT DETECTED NOT DETECTED Final   Klebsiella oxytoca NOT DETECTED NOT DETECTED Final   Klebsiella pneumoniae NOT DETECTED NOT DETECTED Final   Proteus species NOT DETECTED NOT DETECTED Final   Salmonella species NOT DETECTED NOT DETECTED Final   Serratia marcescens NOT DETECTED NOT DETECTED Final   Haemophilus influenzae NOT DETECTED NOT DETECTED Final   Neisseria meningitidis NOT DETECTED NOT DETECTED Final   Pseudomonas aeruginosa DETECTED (A) NOT DETECTED Final    Comment: CRITICAL RESULT CALLED TO, READ BACK BY AND VERIFIED WITH:  PHARMD C. AMEND [phone] @1806  FH    Stenotrophomonas maltophilia NOT DETECTED NOT DETECTED Final   Candida albicans NOT DETECTED NOT DETECTED Final   Candida auris NOT DETECTED NOT DETECTED Final   Candida glabrata NOT DETECTED NOT DETECTED Final   Candida krusei NOT DETECTED NOT DETECTED Final   Candida parapsilosis NOT DETECTED NOT DETECTED Final   Candida tropicalis NOT DETECTED NOT DETECTED Final   Cryptococcus neoformans/gattii NOT DETECTED NOT DETECTED Final   CTX-M ESBL NOT DETECTED NOT DETECTED Final   Carbapenem resistance IMP NOT DETECTED NOT DETECTED Final   Carbapenem resistance KPC NOT DETECTED NOT DETECTED Final   Carbapenem resistance NDM NOT DETECTED NOT DETECTED Final   Carbapenem resistance VIM NOT DETECTED NOT DETECTED Final    Comment: Performed at [facility], [address] [place], [place]  Blood culture (routine x 2)     Status: None (Preliminary result)   Collection Time: 10/08/21 12:01 AM   Specimen: BLOOD RIGHT WRIST  Result Value Ref Range Status   Specimen Description BLOOD RIGHT WRIST  Final   Special Requests   Final    BOTTLES DRAWN AEROBIC AND ANAEROBIC Blood  Culture adequate volume   Culture   Final    NO GROWTH 1 DAY Performed at [facility], [address] [place], [place]    Report Status PENDING  Incomplete         Radiology Studies: VAS [id number] CAROTID  Result Date: 10/09/2021 Carotid Arterial Duplex Study Patient Name:  LEONTYNE MANVILLE  Date of Exam:   10/08/2021 Medical Rec #: 12/08/2021        Accession #:    [id number] Date of Birth: 03-03-1923        Patient Gender: F Patient Age:   89 years Exam Location:  [facility] Procedure:      VAS [facility] CAROTID Referring Phys: [place] --------------------------------------------------------------------------------  Indications:      CVA and Speech disturbance. Risk Factors:     Hypertension, Diabetes. Other Factors:    Dementia, CKD. Limitations       Today's exam was limited due to altered mental status,                   tortuosity. Comparison Study: No prior study on file Performing Technologist: Midge Minium RVS  Examination Guidelines: A complete evaluation includes B-mode imaging, spectral Doppler, color Doppler, and power Doppler as needed of all accessible portions of each vessel. Bilateral testing is considered an integral part of a complete examination. Limited examinations for reoccurring indications may be performed as noted.  Right Carotid Findings: +----------+--------+--------+--------+------------------+------------------+           PSV cm/sEDV cm/sStenosisPlaque DescriptionComments           +----------+--------+--------+--------+------------------+------------------+ CCA Prox  24      3                                 intimal thickening +----------+--------+--------+--------+------------------+------------------+ CCA Distal40      6                                 intimal thickening +----------+--------+--------+--------+------------------+------------------+ ICA Prox  53      13              heterogenous                          +----------+--------+--------+--------+------------------+------------------+  ICA Mid   71      8                                 tortuous           +----------+--------+--------+--------+------------------+------------------+ ICA Distal70      12                                tortuous           +----------+--------+--------+--------+------------------+------------------+ ECA       63      9                                                    +----------+--------+--------+--------+------------------+------------------+ +----------+--------+-------+--------+-------------------+           PSV cm/sEDV cmsDescribeArm Pressure (mmHG) +----------+--------+-------+--------+-------------------+ Subclavian50                                         +----------+--------+-------+--------+-------------------+ +---------+--------+--+--------+-+ VertebralPSV cm/s41EDV cm/s1 +---------+--------+--+--------+-+  Left Carotid Findings: +----------+--------+--------+--------+------------------+------------------+           PSV cm/sEDV cm/sStenosisPlaque DescriptionComments           +----------+--------+--------+--------+------------------+------------------+ CCA Prox  50      4                                 intimal thickening +----------+--------+--------+--------+------------------+------------------+ CCA Distal50      12                                intimal thickening +----------+--------+--------+--------+------------------+------------------+ ICA Prox  44      8               heterogenous      tortuous           +----------+--------+--------+--------+------------------+------------------+ ICA Mid   42      7                                 tortuous           +----------+--------+--------+--------+------------------+------------------+ ICA Distal44      5                                 tortuous            +----------+--------+--------+--------+------------------+------------------+ ECA       65      4                                                    +----------+--------+--------+--------+------------------+------------------+ +----------+--------+--------+--------+-------------------+           PSV cm/sEDV cm/sDescribeArm Pressure (mmHG) +----------+--------+--------+--------+-------------------+ [id number]                                          +----------+--------+--------+--------+-------------------+ +---------+--------+--+--------+-+  VertebralPSV cm/s48EDV cm/s4 +---------+--------+--+--------+-+   Summary: Right Carotid: The extracranial vessels were near-normal with only minimal wall                thickening or plaque. Left Carotid: The extracranial vessels were near-normal with only minimal wall               thickening or plaque. Vertebrals:  Bilateral vertebral arteries demonstrate antegrade flow. Subclavians: Normal flow hemodynamics were seen in bilateral subclavian              arteries. *See table(s) above for measurements and observations.  Electronically signed by Antony Contras MD on 10/09/2021 at 8:18:28 AM.    Final    EEG adult  Result Date: 10/08/2021 Lora Havens, MD     10/08/2021  4:50 PM Patient Name: MALLISSA ANEZ MRN: [id number] Epilepsy Attending: Lora Havens Referring Physician/Provider: Rosalin Hawking, MD Date: 10/08/2021 Duration: 22.12 mins Patient history:  88yo F with acute small subdural cortical infarct. EEG to evaluate for seizure Level of alertness: Awake AEDs during EEG study: None Technical aspects: This EEG study was done with scalp electrodes positioned according to the 10-20 International system of electrode placement. Electrical activity was reviewed with band pass filter of 1-70Hz , sensitivity of 7 uV/mm, display speed of 49mm/sec with a 60Hz  notched filter applied as appropriate. EEG data were recorded continuously and digitally stored.   Video monitoring was available and reviewed as appropriate. Description: The posterior dominant rhythm consists of 8 Hz activity of moderate voltage (25-35 uV) seen predominantly in posterior head regions, symmetric and reactive to eye opening and eye closing. EEG showed intermittent generalized 3 to 6 Hz theta-delta slowing.  Hyperventilation and photic stimulation were not performed.   ABNORMALITY - Intermittent slow, generalized IMPRESSION: This study is suggestive of mild diffuse encephalopathy, nonspecific etiology. No seizures or epileptiform discharges were seen throughout the recording. Priyanka O Yadav   VAS [place] LOWER EXTREMITY VENOUS (DVT)  Result Date: 10/08/2021  Lower Venous DVT Study Patient Name:  KAILIANA HARRIGAN  Date of Exam:   10/08/2021 Medical Rec #: [id number]        Accession #:    [id number] Date of Birth: 12/01/1922        Patient Gender: F Patient Age:   60 years Exam Location:  [facility] Procedure:      VAS [place] LOWER EXTREMITY VENOUS (DVT) Referring Phys: Cornelius Moras XU --------------------------------------------------------------------------------  Indications: Stroke.  Limitations: Altered mental status, unable to position patient's legs adequately and body habitus. Comparison Study: Prior negative left LEV done 03/23/18 Performing Technologist: Sharion Dove RVS  Examination Guidelines: A complete evaluation includes B-mode imaging, spectral Doppler, color Doppler, and power Doppler as needed of all accessible portions of each vessel. Bilateral testing is considered an integral part of a complete examination. Limited examinations for reoccurring indications may be performed as noted. The reflux portion of the exam is performed with the patient in reverse Trendelenburg.  +---------+---------------+---------+-----------+----------+-------------------+ RIGHT    CompressibilityPhasicitySpontaneityPropertiesThrombus Aging       +---------+---------------+---------+-----------+----------+-------------------+ CFV      Full           Yes      Yes                                      +---------+---------------+---------+-----------+----------+-------------------+ SFJ      Full                                                             +---------+---------------+---------+-----------+----------+-------------------+  FV Prox  Full                                                             +---------+---------------+---------+-----------+----------+-------------------+ FV Mid   Full                                                             +---------+---------------+---------+-----------+----------+-------------------+ FV DistalFull                                                             +---------+---------------+---------+-----------+----------+-------------------+ PFV      Full                                                             +---------+---------------+---------+-----------+----------+-------------------+ POP                     Yes      Yes                  patent by color and                                                       Doppler             +---------+---------------+---------+-----------+----------+-------------------+ PTV                                                   Not well visualized +---------+---------------+---------+-----------+----------+-------------------+ PERO                                                  Not well visualized +---------+---------------+---------+-----------+----------+-------------------+   +---------+---------------+---------+-----------+----------+-------------------+ LEFT     CompressibilityPhasicitySpontaneityPropertiesThrombus Aging      +---------+---------------+---------+-----------+----------+-------------------+ CFV      Full                                                              +---------+---------------+---------+-----------+----------+-------------------+ SFJ      Full                                                             +---------+---------------+---------+-----------+----------+-------------------+  FV Prox  Full           Yes      Yes                                      +---------+---------------+---------+-----------+----------+-------------------+ FV Mid   Full                                                             +---------+---------------+---------+-----------+----------+-------------------+ FV DistalFull                                                             +---------+---------------+---------+-----------+----------+-------------------+ PFV      Full           Yes      Yes                                      +---------+---------------+---------+-----------+----------+-------------------+ POP                                                   patent by color and                                                       Doppler             +---------+---------------+---------+-----------+----------+-------------------+ PTV                                                   Not well visualized +---------+---------------+---------+-----------+----------+-------------------+ PERO                                                  Not well visualized +---------+---------------+---------+-----------+----------+-------------------+     Summary: RIGHT: - There is no evidence of deep vein thrombosis in the lower extremity. However, portions of this examination were limited- see technologist comments above.  LEFT: - There is no evidence of deep vein thrombosis in the lower extremity. However, portions of this examination were limited- see technologist comments above.  *See table(s) above for measurements and observations. Electronically signed by Servando Snare MD on 10/08/2021 at 1:35:46 PM.    Final    ECHOCARDIOGRAM  COMPLETE  Result Date: 10/08/2021    ECHOCARDIOGRAM REPORT   Patient Name:   JENNALYNN MCCREE Date of Exam: 10/08/2021 Medical Rec #:  [id number]  Height:       56.3 in Accession #:    [id number]      Weight:       120.0 lb Date of Birth:  11-02-1922       BSA:          1.434 m Patient Age:    43 years        BP:           133/68 mmHg Patient Gender: F               HR:           75 bpm. Exam Location:  Inpatient Procedure: 2D Echo Indications:    stroke  History:        Patient has no prior history of Echocardiogram examinations.                 Chronic kidney disease, Signs/Symptoms:Fever; Risk                 Factors:Hypertension, Dyslipidemia and Diabetes.  Sonographer:    Johny Chess RDCS Sonographer#2:  Greer Pickerel Referring Phys: [place]  1. Left ventricular ejection fraction, by estimation, is 65 to 70%. The left ventricle has normal function. The left ventricle has no regional wall motion abnormalities. Left ventricular diastolic parameters are consistent with Grade I diastolic dysfunction (impaired relaxation).  2. Right ventricular systolic function is normal. The right ventricular size is normal. There is mildly elevated pulmonary artery systolic pressure. The estimated right ventricular systolic pressure is 123XX123 mmHg.  3. Left atrial size was moderately dilated.  4. Right atrial size was mildly dilated.  5. The mitral valve is normal in structure. No evidence of mitral valve regurgitation. No evidence of mitral stenosis.  6. Tricuspid valve regurgitation is moderate.  7. The aortic valve is tricuspid. There is moderate calcification of the aortic valve. There is moderate thickening of the aortic valve. Aortic valve regurgitation is moderate. Aortic valve sclerosis is present, with no evidence of aortic valve stenosis.  8. The inferior vena cava is dilated in size with >50% respiratory variability, suggesting right atrial pressure of 8 mmHg.  Conclusion(s)/Recommendation(s): No intracardiac source of embolism detected on this transthoracic study. Consider a transesophageal echocardiogram to exclude cardiac source of embolism if clinically indicated. FINDINGS  Left Ventricle: Left ventricular ejection fraction, by estimation, is 65 to 70%. The left ventricle has normal function. The left ventricle has no regional wall motion abnormalities. The left ventricular internal cavity size was normal in size. There is  no left ventricular hypertrophy. Left ventricular diastolic parameters are consistent with Grade I diastolic dysfunction (impaired relaxation). Right Ventricle: The right ventricular size is normal. No increase in right ventricular wall thickness. Right ventricular systolic function is normal. There is mildly elevated pulmonary artery systolic pressure. The tricuspid regurgitant velocity is 3.00  m/s, and with an assumed right atrial pressure of 8 mmHg, the estimated right ventricular systolic pressure is 123XX123 mmHg. Left Atrium: Left atrial size was moderately dilated. Right Atrium: Right atrial size was mildly dilated. Pericardium: There is no evidence of pericardial effusion. Mitral Valve: The mitral valve is normal in structure. No evidence of mitral valve regurgitation. No evidence of mitral valve stenosis. Tricuspid Valve: The tricuspid valve is normal in structure. Tricuspid valve regurgitation is moderate . No evidence of tricuspid stenosis. Aortic Valve: The aortic valve is tricuspid. There is moderate calcification of the aortic valve. There is moderate thickening of the aortic  valve. Aortic valve regurgitation is moderate. Aortic regurgitation PHT measures 318 msec. Aortic valve sclerosis  is present, with no evidence of aortic valve stenosis. Aortic valve mean gradient measures 11.0 mmHg. Aortic valve peak gradient measures 19.8 mmHg. Aortic valve area, by VTI measures 2.38 cm. Pulmonic Valve: The pulmonic valve was normal in structure.  Pulmonic valve regurgitation is mild. No evidence of pulmonic stenosis. Aorta: The aortic root is normal in size and structure. Venous: The inferior vena cava is dilated in size with greater than 50% respiratory variability, suggesting right atrial pressure of 8 mmHg. IAS/Shunts: No atrial level shunt detected by color flow Doppler.  LEFT VENTRICLE PLAX 2D LVIDd:         4.50 cm   Diastology LVIDs:         3.00 cm   LV e' medial:    5.55 cm/s LV PW:         1.00 cm   LV E/e' medial:  17.8 LV IVS:        0.90 cm   LV e' lateral:   8.38 cm/s LVOT diam:     2.00 cm   LV E/e' lateral: 11.8 LV SV:         94 LV SV Index:   66 LVOT Area:     3.14 cm  RIGHT VENTRICLE             IVC RV Basal diam:  2.80 cm     IVC diam: 2.00 cm RV S prime:     14.50 cm/s TAPSE (M-mode): 2.9 cm LEFT ATRIUM             Index        RIGHT ATRIUM           Index LA diam:        3.20 cm 2.23 cm/m   RA Area:     15.90 cm LA Vol (A2C):   68.3 ml 47.63 ml/m  RA Volume:   35.90 ml  25.04 ml/m LA Vol (A4C):   50.3 ml 35.08 ml/m LA Biplane Vol: 62.8 ml 43.80 ml/m  AORTIC VALVE AV Area (Vmax):    2.15 cm AV Area (Vmean):   2.04 cm AV Area (VTI):     2.38 cm AV Vmax:           222.50 cm/s AV Vmean:          151.000 cm/s AV VTI:            0.395 m AV Peak Grad:      19.8 mmHg AV Mean Grad:      11.0 mmHg LVOT Vmax:         152.00 cm/s LVOT Vmean:        98.100 cm/s LVOT VTI:          0.299 m LVOT/AV VTI ratio: 0.76 AI PHT:            318 msec  AORTA Ao Asc diam: 3.20 cm MITRAL VALVE                TRICUSPID VALVE MV Area (PHT): 4.57 cm     TR Peak grad:   36.0 mmHg MV Decel Time: 166 msec     TR Vmax:        300.00 cm/s MV E velocity: 99.00 cm/s MV A velocity: 119.00 cm/s  SHUNTS MV E/A ratio:  0.83         Systemic VTI:  0.30 m  Systemic Diam: 2.00 cm Donato Schultz MD Electronically signed by Donato Schultz MD Signature Date/Time: 10/08/2021/12:07:00 PM    Final    MR ANGIO HEAD WO CONTRAST  Result Date:  10/08/2021 CLINICAL DATA:  Acute neurologic deficit EXAM: MRA HEAD WITHOUT CONTRAST TECHNIQUE: Angiographic images of the Circle of Willis were acquired using MRA technique without intravenous contrast. COMPARISON:  None Available. FINDINGS: POSTERIOR CIRCULATION: --Vertebral arteries: Normal --Inferior cerebellar arteries: Normal. --Basilar artery: Normal. --Superior cerebellar arteries: Normal. --Posterior cerebral arteries: Normal. ANTERIOR CIRCULATION: --Intracranial internal carotid arteries: Normal. --Anterior cerebral arteries (ACA): Normal. --Middle cerebral arteries (MCA): Normal. ANATOMIC VARIANTS: Both P comms are patent. IMPRESSION: Normal intracranial MRA. Electronically Signed   By: Deatra Robinson M.D.   On: 10/08/2021 02:58   MR BRAIN WO CONTRAST  Result Date: 10/07/2021 CLINICAL DATA:  Aphasia EXAM: MRI HEAD WITHOUT CONTRAST TECHNIQUE: Multiplanar, multiecho pulse sequences of the brain and surrounding structures were obtained without intravenous contrast. COMPARISON:  04/25/2017 FINDINGS: Brain: Small focus of acute ischemia in the right cerebellar hemisphere. Old left frontal infarct. Chronic hemo siderosis at the site of the left frontal remote infarct. There is multifocal hyperintense T2-weighted signal within the white matter. Generalized volume loss. The midline structures are normal. Vascular: Major flow voids are preserved. Skull and upper cervical spine: Normal calvarium and skull base. Visualized upper cervical spine and soft tissues are normal. Sinuses/Orbits:No paranasal sinus fluid levels or advanced mucosal thickening. No mastoid or middle ear effusion. Normal orbits. IMPRESSION: 1. Small focus of acute ischemia in the right cerebellar hemisphere. No hemorrhage or mass effect. 2. 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 Electronically Signed   By: Deatra Robinson M.D.   On: 10/07/2021 23:32   CT HEAD WO CONTRAST ( )  Result Date: 10/07/2021 CLINICAL  DATA:  Mental status change. EXAM: CT HEAD WITHOUT CONTRAST TECHNIQUE: Contiguous axial images were obtained from the base of the skull through the vertex without intravenous contrast. RADIATION DOSE REDUCTION: This exam was performed according to the departmental dose-optimization program which includes automated exposure control, adjustment of the mA and/or kV according to patient size and/or use of iterative reconstruction technique. COMPARISON:  None Available. FINDINGS: Brain: Area of hypoattenuation in the left frontoparietal region spends the cortex and the subcortical white matter no evidence of hydrocephalus or mass effect. Mild brain parenchymal volume loss and deep white matter microangiopathy for age. Vascular: No hyperdense vessel or unexpected calcification. Skull: Normal. Negative for fracture or focal lesion. Sinuses/Orbits: No acute finding. Other: None. IMPRESSION: 1. Area of hypoattenuation in the left frontoparietal region spends the cortex and the subcortical white matter, which may represent an acute or subacute infarct. Further evaluation with MRI of the brain may be considered, if found clinically appropriate. 2. Mild brain parenchymal volume loss and deep white matter microangiopathy for age. Electronically Signed   By: Ted Mcalpine M.D.   On: 10/07/2021 21:09   DG Chest Port 1 View  Result Date: 10/07/2021 CLINICAL DATA:  Altered mental status EXAM: PORTABLE CHEST 1 VIEW COMPARISON:  05/05/2021 FINDINGS: Heart is borderline in size. Aortic atherosclerosis. No confluent airspace opacities or effusions. No acute bony abnormality. IMPRESSION: No active disease. Electronically Signed   By: Charlett Nose M.D.   On: 10/07/2021 21:00        Scheduled Meds:  aspirin  81 mg Oral Daily   Or   aspirin  300 mg Rectal Daily   clopidogrel  75 mg Oral Daily   enoxaparin (LOVENOX)  injection  30 mg Subcutaneous Q24H   pravastatin  20 mg Oral q1800   Continuous Infusions:  ceFEPime  (MAXIPIME) IV 2 g (10/08/21 2321)     LOS: 2 days    Time spent: 35 minutes    Barb Merino, MD [facility] Pager [phone]

## 2021-10-09 NOTE — Progress Notes (Signed)
STROKE TEAM PROGRESS NOTE   SUBJECTIVE (INTERVAL HISTORY) Caregiver is at the bedside. Pt lying in bed, initially sleepy but easily arousable. Overnight no acute issue. 1/2 blood culture showed pseudomonas. Now on cefepime.    OBJECTIVE Temp:  [98.4 F (36.9 C)-98.6 F (37 C)] 98.4 F (36.9 C) (08/08 0752) Pulse Rate:  [70-85] 75 (08/08 0752) Cardiac Rhythm: Normal sinus rhythm;Bundle branch block (08/08 0748) Resp:  [18-20] 18 (08/08 0512) BP: (112-141)/(46-59) 123/59 (08/08 0752) SpO2:  [94 %-100 %] 97 % (08/08 0752)  No results for input(s): "GLUCAP" in the last 168 hours. Recent Labs  Lab 10/07/21 2040 10/08/21 0545 10/09/21 0539  NA 136 137 137  K 4.4 4.3 3.7  CL 101 105 105  CO2 26 27 24   GLUCOSE 149* 130* 112*  BUN 53* 45* 34*  CREATININE 1.25* 1.08* 1.00  CALCIUM 10.2 9.1 9.1  MG  --   --  1.9  PHOS  --   --  2.7   Recent Labs  Lab 10/07/21 2040 10/08/21 0545  AST 33 26  ALT 20 17  ALKPHOS 73 53  BILITOT 0.8 1.0  PROT 7.1 6.2*  ALBUMIN 3.8 2.9*   Recent Labs  Lab 10/07/21 2040 10/08/21 0545 10/09/21 0539  WBC 17.2* 20.3* 11.4*  NEUTROABS 15.9*  --  9.7*  HGB 11.8* 10.4* 9.8*  HCT 36.5 33.4* 30.2*  MCV 85.5 86.3 84.1  PLT 219 189 171   No results for input(s): "CKTOTAL", "CKMB", "CKMBINDEX", "TROPONINI" in the last 168 hours. No results for input(s): "LABPROT", "INR" in the last 72 hours. Recent Labs    10/07/21 2022  COLORURINE YELLOW  LABSPEC 1.013  PHURINE 6.0  GLUCOSEU NEGATIVE  HGBUR MODERATE*  BILIRUBINUR NEGATIVE  KETONESUR NEGATIVE  PROTEINUR 100*  NITRITE NEGATIVE  LEUKOCYTESUR NEGATIVE       Component Value Date/Time   CHOL 154 10/08/2021 0545   CHOL 143 05/05/2014 1037   TRIG 26 10/08/2021 0545   HDL 67 10/08/2021 0545   HDL 72 05/05/2014 1037   CHOLHDL 2.3 10/08/2021 0545   VLDL 5 10/08/2021 0545   LDLCALC 82 10/08/2021 0545   LDLCALC 65 05/05/2014 1037   Lab Results  Component Value Date   HGBA1C 5.7 (H)  10/08/2021      Component Value Date/Time   LABOPIA NONE DETECTED 06/29/2011 1441   COCAINSCRNUR NONE DETECTED 06/29/2011 1441   LABBENZ NONE DETECTED 06/29/2011 1441   AMPHETMU NONE DETECTED 06/29/2011 1441   THCU NONE DETECTED 06/29/2011 1441   LABBARB NONE DETECTED 06/29/2011 1441    No results for input(s): "ETH" in the last 168 hours.  I have personally reviewed the radiological images below and agree with the radiology interpretations.  VAS US CAROTID  Result Date: 10/09/2021 Carotid Arterial Duplex Study Patient Name:  SHALLYN FELTNER  Date of Exam:   10/08/2021 Medical Rec #: [id number]        Accession #:    [id number] Date of Birth: [birth year]        Patient Gender: F Patient Age:   88 years Exam Location:  [facility] Procedure:      VAS US CAROTID Referring Phys: Gean Birchwood --------------------------------------------------------------------------------  Indications:      CVA and Speech disturbance. Risk Factors:     Hypertension, Diabetes. Other Factors:    Dementia, CKD. Limitations       Today's exam was limited due to altered mental status,  tortuosity. Comparison Study: No prior study on file Performing Technologist: Sharion Dove RVS  Examination Guidelines: A complete evaluation includes B-mode imaging, spectral Doppler, color Doppler, and power Doppler as needed of all accessible portions of each vessel. Bilateral testing is considered an integral part of a complete examination. Limited examinations for reoccurring indications may be performed as noted.  Right Carotid Findings: +----------+--------+--------+--------+------------------+------------------+           PSV cm/sEDV cm/sStenosisPlaque DescriptionComments           +----------+--------+--------+--------+------------------+------------------+ CCA Prox  24      3                                 intimal thickening  +----------+--------+--------+--------+------------------+------------------+ CCA Distal40      6                                 intimal thickening +----------+--------+--------+--------+------------------+------------------+ ICA Prox  53      13              heterogenous                         +----------+--------+--------+--------+------------------+------------------+ ICA Mid   71      8                                 tortuous           +----------+--------+--------+--------+------------------+------------------+ ICA Distal70      12                                tortuous           +----------+--------+--------+--------+------------------+------------------+ ECA       63      9                                                    +----------+--------+--------+--------+------------------+------------------+ +----------+--------+-------+--------+-------------------+           PSV cm/sEDV cmsDescribeArm Pressure (mmHG) +----------+--------+-------+--------+-------------------+ Subclavian50                                         +----------+--------+-------+--------+-------------------+ +---------+--------+--+--------+-+ VertebralPSV cm/s41EDV cm/s1 +---------+--------+--+--------+-+  Left Carotid Findings: +----------+--------+--------+--------+------------------+------------------+           PSV cm/sEDV cm/sStenosisPlaque DescriptionComments           +----------+--------+--------+--------+------------------+------------------+ CCA Prox  50      4                                 intimal thickening +----------+--------+--------+--------+------------------+------------------+ CCA Distal50      12                                intimal thickening +----------+--------+--------+--------+------------------+------------------+ ICA Prox  44      8  heterogenous      tortuous            +----------+--------+--------+--------+------------------+------------------+ ICA Mid   42      7                                 tortuous           +----------+--------+--------+--------+------------------+------------------+ ICA Distal44      5                                 tortuous           +----------+--------+--------+--------+------------------+------------------+ ECA       65      4                                                    +----------+--------+--------+--------+------------------+------------------+ +----------+--------+--------+--------+-------------------+           PSV cm/sEDV cm/sDescribeArm Pressure (mmHG) +----------+--------+--------+--------+-------------------+ [id number]                                          +----------+--------+--------+--------+-------------------+ +---------+--------+--+--------+-+ VertebralPSV cm/s48EDV cm/s4 +---------+--------+--+--------+-+   Summary: Right Carotid: The extracranial vessels were near-normal with only minimal wall                thickening or plaque. Left Carotid: The extracranial vessels were near-normal with only minimal wall               thickening or plaque. Vertebrals:  Bilateral vertebral arteries demonstrate antegrade flow. Subclavians: Normal flow hemodynamics were seen in bilateral subclavian              arteries. *See table(s) above for measurements and observations.  Electronically signed by Delia Heady MD on 10/09/2021 at 8:18:28 AM.    Final    EEG adult  Result Date: 10/08/2021 Charlsie Quest, MD     10/08/2021  4:50 PM Patient Name: FATEN FRIESON MRN: [id number] Epilepsy Attending: Charlsie Quest Referring Physician/Provider: Marvel Plan, MD Date: 10/08/2021 Duration: 22.12 mins Patient history:  88yo F with acute small subdural cortical infarct. EEG to evaluate for seizure Level of alertness: Awake AEDs during EEG study: None Technical aspects: This EEG study was done with scalp  electrodes positioned according to the 10-20 International system of electrode placement. Electrical activity was reviewed with band pass filter of 1-70Hz , sensitivity of 7 uV/mm, display speed of 60mm/sec with a 60Hz  notched filter applied as appropriate. EEG data were recorded continuously and digitally stored.  Video monitoring was available and reviewed as appropriate. Description: The posterior dominant rhythm consists of 8 Hz activity of moderate voltage (25-35 uV) seen predominantly in posterior head regions, symmetric and reactive to eye opening and eye closing. EEG showed intermittent generalized 3 to 6 Hz theta-delta slowing.  Hyperventilation and photic stimulation were not performed.   ABNORMALITY - Intermittent slow, generalized IMPRESSION: This study is suggestive of mild diffuse encephalopathy, nonspecific etiology. No seizures or epileptiform discharges were seen throughout the recording. Priyanka O Yadav   VAS LOWER EXTREMITY VENOUS (DVT)  Result Date: 10/08/2021  Lower Venous DVT Study Patient  Name:  JANIEYA CHINCHILLA  Date of Exam:   10/08/2021 Medical Rec #: [id number]        Accession #:    [id number] Date of Birth: 1923-01-29        Patient Gender: F Patient Age:   24 years Exam Location:  [facility] Procedure:      VAS [place] LOWER EXTREMITY VENOUS (DVT) Referring Phys: Cornelius Moras Lynzie Cliburn --------------------------------------------------------------------------------  Indications: Stroke.  Limitations: Altered mental status, unable to position patient's legs adequately and body habitus. Comparison Study: Prior negative left LEV done 03/23/18 Performing Technologist: Sharion Dove RVS  Examination Guidelines: A complete evaluation includes B-mode imaging, spectral Doppler, color Doppler, and power Doppler as needed of all accessible portions of each vessel. Bilateral testing is considered an integral part of a complete examination. Limited examinations for reoccurring indications may be performed  as noted. The reflux portion of the exam is performed with the patient in reverse Trendelenburg.  +---------+---------------+---------+-----------+----------+-------------------+ RIGHT    CompressibilityPhasicitySpontaneityPropertiesThrombus Aging      +---------+---------------+---------+-----------+----------+-------------------+ CFV      Full           Yes      Yes                                      +---------+---------------+---------+-----------+----------+-------------------+ SFJ      Full                                                             +---------+---------------+---------+-----------+----------+-------------------+ FV Prox  Full                                                             +---------+---------------+---------+-----------+----------+-------------------+ FV Mid   Full                                                             +---------+---------------+---------+-----------+----------+-------------------+ FV DistalFull                                                             +---------+---------------+---------+-----------+----------+-------------------+ PFV      Full                                                             +---------+---------------+---------+-----------+----------+-------------------+ POP                     Yes      Yes  patent by color and                                                       Doppler             +---------+---------------+---------+-----------+----------+-------------------+ PTV                                                   Not well visualized +---------+---------------+---------+-----------+----------+-------------------+ PERO                                                  Not well visualized +---------+---------------+---------+-----------+----------+-------------------+    +---------+---------------+---------+-----------+----------+-------------------+ LEFT     CompressibilityPhasicitySpontaneityPropertiesThrombus Aging      +---------+---------------+---------+-----------+----------+-------------------+ CFV      Full                                                             +---------+---------------+---------+-----------+----------+-------------------+ SFJ      Full                                                             +---------+---------------+---------+-----------+----------+-------------------+ FV Prox  Full           Yes      Yes                                      +---------+---------------+---------+-----------+----------+-------------------+ FV Mid   Full                                                             +---------+---------------+---------+-----------+----------+-------------------+ FV DistalFull                                                             +---------+---------------+---------+-----------+----------+-------------------+ PFV      Full           Yes      Yes                                      +---------+---------------+---------+-----------+----------+-------------------+ POP  patent by color and                                                       Doppler             +---------+---------------+---------+-----------+----------+-------------------+ PTV                                                   Not well visualized +---------+---------------+---------+-----------+----------+-------------------+ PERO                                                  Not well visualized +---------+---------------+---------+-----------+----------+-------------------+     Summary: RIGHT: - There is no evidence of deep vein thrombosis in the lower extremity. However, portions of this examination were limited- see technologist  comments above.  LEFT: - There is no evidence of deep vein thrombosis in the lower extremity. However, portions of this examination were limited- see technologist comments above.  *See table(s) above for measurements and observations. Electronically signed by Servando Snare MD on 10/08/2021 at 1:35:46 PM.    Final    ECHOCARDIOGRAM COMPLETE  Result Date: 10/08/2021    ECHOCARDIOGRAM REPORT   Patient Name:   JAZARIAH ROSCH Date of Exam: 10/08/2021 Medical Rec #:  [id number]       Height:       56.3 in Accession #:    [id number]      Weight:       120.0 lb Date of Birth:  [birth year]       BSA:          1.434 m Patient Age:    61 years        BP:           133/68 mmHg Patient Gender: F               HR:           75 bpm. Exam Location:  Inpatient Procedure: 2D Echo Indications:    stroke  History:        Patient has no prior history of Echocardiogram examinations.                 Chronic kidney disease, Signs/Symptoms:Fever; Risk                 Factors:Hypertension, Dyslipidemia and Diabetes.  Sonographer:    Johny Chess RDCS Sonographer#2:  Greer Pickerel Referring Phys: [facility]  1. Left ventricular ejection fraction, by estimation, is 65 to 70%. The left ventricle has normal function. The left ventricle has no regional wall motion abnormalities. Left ventricular diastolic parameters are consistent with Grade I diastolic dysfunction (impaired relaxation).  2. Right ventricular systolic function is normal. The right ventricular size is normal. There is mildly elevated pulmonary artery systolic pressure. The estimated right ventricular systolic pressure is 123XX123 mmHg.  3. Left atrial size was moderately dilated.  4. Right atrial size was mildly dilated.  5. The mitral valve is normal in structure. No evidence of mitral valve regurgitation. No  evidence of mitral stenosis.  6. Tricuspid valve regurgitation is moderate.  7. The aortic valve is tricuspid. There is moderate calcification of the  aortic valve. There is moderate thickening of the aortic valve. Aortic valve regurgitation is moderate. Aortic valve sclerosis is present, with no evidence of aortic valve stenosis.  8. The inferior vena cava is dilated in size with >50% respiratory variability, suggesting right atrial pressure of 8 mmHg. Conclusion(s)/Recommendation(s): No intracardiac source of embolism detected on this transthoracic study. Consider a transesophageal echocardiogram to exclude cardiac source of embolism if clinically indicated. FINDINGS  Left Ventricle: Left ventricular ejection fraction, by estimation, is 65 to 70%. The left ventricle has normal function. The left ventricle has no regional wall motion abnormalities. The left ventricular internal cavity size was normal in size. There is  no left ventricular hypertrophy. Left ventricular diastolic parameters are consistent with Grade I diastolic dysfunction (impaired relaxation). Right Ventricle: The right ventricular size is normal. No increase in right ventricular wall thickness. Right ventricular systolic function is normal. There is mildly elevated pulmonary artery systolic pressure. The tricuspid regurgitant velocity is 3.00  m/s, and with an assumed right atrial pressure of 8 mmHg, the estimated right ventricular systolic pressure is 123XX123 mmHg. Left Atrium: Left atrial size was moderately dilated. Right Atrium: Right atrial size was mildly dilated. Pericardium: There is no evidence of pericardial effusion. Mitral Valve: The mitral valve is normal in structure. No evidence of mitral valve regurgitation. No evidence of mitral valve stenosis. Tricuspid Valve: The tricuspid valve is normal in structure. Tricuspid valve regurgitation is moderate . No evidence of tricuspid stenosis. Aortic Valve: The aortic valve is tricuspid. There is moderate calcification of the aortic valve. There is moderate thickening of the aortic valve. Aortic valve regurgitation is moderate. Aortic  regurgitation PHT measures 318 msec. Aortic valve sclerosis  is present, with no evidence of aortic valve stenosis. Aortic valve mean gradient measures 11.0 mmHg. Aortic valve peak gradient measures 19.8 mmHg. Aortic valve area, by VTI measures 2.38 cm. Pulmonic Valve: The pulmonic valve was normal in structure. Pulmonic valve regurgitation is mild. No evidence of pulmonic stenosis. Aorta: The aortic root is normal in size and structure. Venous: The inferior vena cava is dilated in size with greater than 50% respiratory variability, suggesting right atrial pressure of 8 mmHg. IAS/Shunts: No atrial level shunt detected by color flow Doppler.  LEFT VENTRICLE PLAX 2D LVIDd:         4.50 cm   Diastology LVIDs:         3.00 cm   LV e' medial:    5.55 cm/s LV PW:         1.00 cm   LV E/e' medial:  17.8 LV IVS:        0.90 cm   LV e' lateral:   8.38 cm/s LVOT diam:     2.00 cm   LV E/e' lateral: 11.8 LV SV:         94 LV SV Index:   66 LVOT Area:     3.14 cm  RIGHT VENTRICLE             IVC RV Basal diam:  2.80 cm     IVC diam: 2.00 cm RV S prime:     14.50 cm/s TAPSE (M-mode): 2.9 cm LEFT ATRIUM             Index        RIGHT ATRIUM  Index LA diam:        3.20 cm 2.23 cm/m   RA Area:     15.90 cm LA Vol (A2C):   68.3 ml 47.63 ml/m  RA Volume:   35.90 ml  25.04 ml/m LA Vol (A4C):   50.3 ml 35.08 ml/m LA Biplane Vol: 62.8 ml 43.80 ml/m  AORTIC VALVE AV Area (Vmax):    2.15 cm AV Area (Vmean):   2.04 cm AV Area (VTI):     2.38 cm AV Vmax:           222.50 cm/s AV Vmean:          151.000 cm/s AV VTI:            0.395 m AV Peak Grad:      19.8 mmHg AV Mean Grad:      11.0 mmHg LVOT Vmax:         152.00 cm/s LVOT Vmean:        98.100 cm/s LVOT VTI:          0.299 m LVOT/AV VTI ratio: 0.76 AI PHT:            318 msec  AORTA Ao Asc diam: 3.20 cm MITRAL VALVE                TRICUSPID VALVE MV Area (PHT): 4.57 cm     TR Peak grad:   36.0 mmHg MV Decel Time: 166 msec     TR Vmax:        300.00 cm/s MV E  velocity: 99.00 cm/s MV A velocity: 119.00 cm/s  SHUNTS MV E/A ratio:  0.83         Systemic VTI:  0.30 m                             Systemic Diam: 2.00 cm Donato Schultz MD Electronically signed by Donato Schultz MD Signature Date/Time: 10/08/2021/12:07:00 PM    Final    MR ANGIO HEAD WO CONTRAST  Result Date: 10/08/2021 CLINICAL DATA:  Acute neurologic deficit EXAM: MRA HEAD WITHOUT CONTRAST TECHNIQUE: Angiographic images of the Circle of Willis were acquired using MRA technique without intravenous contrast. COMPARISON:  None Available. FINDINGS: POSTERIOR CIRCULATION: --Vertebral arteries: Normal --Inferior cerebellar arteries: Normal. --Basilar artery: Normal. --Superior cerebellar arteries: Normal. --Posterior cerebral arteries: Normal. ANTERIOR CIRCULATION: --Intracranial internal carotid arteries: Normal. --Anterior cerebral arteries (ACA): Normal. --Middle cerebral arteries (MCA): Normal. ANATOMIC VARIANTS: Both P comms are patent. IMPRESSION: Normal intracranial MRA. Electronically Signed   By: Deatra Robinson M.D.   On: 10/08/2021 02:58   MR BRAIN WO CONTRAST  Result Date: 10/07/2021 CLINICAL DATA:  Aphasia EXAM: MRI HEAD WITHOUT CONTRAST TECHNIQUE: Multiplanar, multiecho pulse sequences of the brain and surrounding structures were obtained without intravenous contrast. COMPARISON:  04/25/2017 FINDINGS: Brain: Small focus of acute ischemia in the right cerebellar hemisphere. Old left frontal infarct. Chronic hemo siderosis at the site of the left frontal remote infarct. There is multifocal hyperintense T2-weighted signal within the white matter. Generalized volume loss. The midline structures are normal. Vascular: Major flow voids are preserved. Skull and upper cervical spine: Normal calvarium and skull base. Visualized upper cervical spine and soft tissues are normal. Sinuses/Orbits:No paranasal sinus fluid levels or advanced mucosal thickening. No mastoid or middle ear effusion. Normal orbits. IMPRESSION:  1. Small focus of acute ischemia in the right cerebellar hemisphere. No hemorrhage or mass effect. 2. Old left frontal infarct  and findings of chronic ischemic microangiopathy. Electronically Signed   By: Ulyses Jarred M.D.   On: 10/07/2021 23:32   CT HEAD WO CONTRAST (5MM)  Result Date: 10/07/2021 CLINICAL DATA:  Mental status change. EXAM: CT HEAD WITHOUT CONTRAST TECHNIQUE: Contiguous axial images were obtained from the base of the skull through the vertex without intravenous contrast. RADIATION DOSE REDUCTION: This exam was performed according to the departmental dose-optimization program which includes automated exposure control, adjustment of the mA and/or kV according to patient size and/or use of iterative reconstruction technique. COMPARISON:  None Available. FINDINGS: Brain: Area of hypoattenuation in the left frontoparietal region spends the cortex and the subcortical white matter no evidence of hydrocephalus or mass effect. Mild brain parenchymal volume loss and deep white matter microangiopathy for age. Vascular: No hyperdense vessel or unexpected calcification. Skull: Normal. Negative for fracture or focal lesion. Sinuses/Orbits: No acute finding. Other: None. IMPRESSION: 1. Area of hypoattenuation in the left frontoparietal region spends the cortex and the subcortical white matter, which may represent an acute or subacute infarct. Further evaluation with MRI of the brain may be considered, if found clinically appropriate. 2. Mild brain parenchymal volume loss and deep white matter microangiopathy for age. Electronically Signed   By: Fidela Salisbury M.D.   On: 10/07/2021 21:09   DG Chest Port 1 View  Result Date: 10/07/2021 CLINICAL DATA:  Altered mental status EXAM: PORTABLE CHEST 1 VIEW COMPARISON:  05/05/2021 FINDINGS: Heart is borderline in size. Aortic atherosclerosis. No confluent airspace opacities or effusions. No acute bony abnormality. IMPRESSION: No active disease. Electronically  Signed   By: Rolm Baptise M.D.   On: 10/07/2021 21:00     PHYSICAL EXAM  Temp:  [98.4 F (36.9 C)-98.6 F (37 C)] 98.4 F (36.9 C) (08/08 0752) Pulse Rate:  [70-85] 75 (08/08 0752) Resp:  [18-20] 18 (08/08 0512) BP: (112-141)/(46-59) 123/59 (08/08 0752) SpO2:  [94 %-100 %] 97 % (08/08 0752)  General - Well nourished, well developed, in no apparent distress.  Ophthalmologic - fundi not visualized due to noncooperation.  Cardiovascular - Regular rhythm and rate.  Neuro - awake, alert, eyes open, orientated to people and place, not orientated to time or age. No aphasia, paucity of speech, hard of hearing but answer questions appropriately, following all simple commands. Able to name 2/3 and repeat simple sentences. No gaze palsy, tracking bilaterally, visual field full. No facial droop. Tongue midline. Bilateral UEs 4/5, no drift. Bilaterally LEs 4/5, no drift. Sensation symmetrical bilaterally, b/l FTN intact grossly, gait not tested.     ASSESSMENT/PLAN Ms. LAYAAN QUIST is a 88 y.o. female with history of hypertension, hyperlipidemia, diabetes, dementia, OSA admitted for fever with shivering and tremors. No tPA given due to outside window.    Stroke: Right acute small subdural cortical infarct, embolic pattern, etiology unclear  CT no acute abnormality, old left frontal infarct MRI right small cerebellum cortical infarct, old left frontal infarct MRA unremarkable Carotid Doppler unremarkable EEG mild diffuse encephalopathy, no seizure 2D Echo EF 65 to 70% LE venous Doppler negative for DVT Recommend 30-day CardioNet monitoring to rule out A-fib as outpatient LDL 82 HgbA1c 5.7 Lovenox for VTE prophylaxis aspirin 81 mg daily prior to admission, now on aspirin 81 mg daily and clopidogrel 75 mg daily DAPT for 3 weeks and then plavix alone. Patient counseled to be compliant with her antithrombotic medications Ongoing aggressive stroke risk factor management Therapy  recommendations: Pending Disposition: Pending  Bacteremia  Fever Leukocytosis Temp 102.2->  afebrile WBC 17.2-20.3 Blood culture 8/6 positive for Pseudomonas Blood culture 8/7 pending On cefepime and vancomycin -> cefepime UA negative CXR negative Per Dr. Sloan Leiter, will consider TEE if the 2nd blood culture also positive.   Diabetes HgbA1c 5.7 goal < 7.0 Controlled CBG monitoring SSI DM education and close PCP follow up  Hypertension Stable Long term BP goal normotensive  Hyperlipidemia Home meds: None LDL 82, goal < 70 Now on pravastatin 20 No high intensity statin needed given advanced age and LDL near goal Continue statin at discharge  Other Stroke Risk Factors Advanced age Hx stroke/TIA on imaging Obstructive sleep apnea, on CPAP at home  Other Active Problems Dementia with home caregiver 24/7  Hospital day # 2    Rosalin Hawking, MD PhD Stroke Neurology 10/09/2021 10:58 AM    To contact Stroke Continuity provider, please refer to [email or web address] After hours, contact General Neurology
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
## 2021-10-09 NOTE — TOC Initial Note (Signed)
Transition of Care [facility]) - Initial/Assessment Note    Patient Details  Name: Jessica Hawkins MRN: [id number] Date of Birth: 04/30/1922  [facility] CM/SW Contact:    Pollie Friar, RN Phone Number: 10/09/2021, 11:47 AM  Clinical Narrative:                 CM met with the patient and one of her caregivers at the bedside. Pt slept through visit. Caregiver states that the patient has 24 hour caregiving coverage. They provide her her medications and any transportation needs.  Caregiver called pts daughter, [name] [facility]) and she is interested in Home health therapy at d/c. She states they have used [facility] in the past and would like to use them again.  Kelly with [facility] has accepted the referral and information on the AVS.  TOC following.  Expected Discharge Plan: [place] Barriers to Discharge: Continued Medical Work up   Patient Goals and CMS Choice   CMS Medicare.gov Compare Post Acute Care list provided to:: Patient Represenative (must comment) Choice offered to / list presented to : Adult Children, HC POA / Guardian  Expected Discharge Plan and Services Expected Discharge Plan: [place]   Discharge Planning Services: CM Consult Post Acute Care Choice: [place] arrangements for the past 2 months: [place]: PT, OT HH Agency: [place]: 10/09/21   Representative spoke with at [place]: Claiborne Billings  Prior Living Arrangements/Services Living arrangements for the past 2 months: [place] Lives with:: Self, Other (Comment) Patient language and need for interpreter reviewed:: Yes Do you feel safe going back to the place where you live?: Yes      Need for Family Participation in Patient Care: Yes (Comment) Care giver support system in place?: Yes (comment) (Pt has 24 hour caregivers) Current home services: DME (walk in  tub/ walker/ cane/ wheelchair) Criminal Activity/Legal Involvement Pertinent to Current Situation/Hospitalization: No - Comment as needed  Activities of Daily Living Home Assistive Devices/Equipment: Environmental consultant (specify type), Wheelchair ADL Screening (condition at time of admission) Patient's cognitive ability adequate to safely complete daily activities?: No Is the patient deaf or have difficulty hearing?: Yes Does the patient have difficulty seeing, even when wearing glasses/contacts?: Yes Does the patient have difficulty concentrating, remembering, or making decisions?: Yes Patient able to express need for assistance with ADLs?: Yes Does the patient have difficulty dressing or bathing?: Yes Independently performs ADLs?: No Communication: Independent Dressing (OT): Needs assistance Is this a change from baseline?: Pre-admission baseline Grooming: Needs assistance Is this a change from baseline?: Pre-admission baseline Feeding: Appropriate for developmental age Bathing: Needs assistance Is this a change from baseline?: Pre-admission baseline Toileting: Needs assistance Is this a change from baseline?: Pre-admission baseline In/Out Bed: Needs assistance Is this a change from baseline?: Pre-admission baseline Walks in Home: Independent with device (comment) Does the patient have difficulty walking or climbing stairs?: Yes Weakness of Legs: Both Weakness of Arms/Hands: None  Permission Sought/Granted                  Emotional Assessment Appearance:: Appears stated age         Psych Involvement: No (comment)  Admission diagnosis:  Tremor [R25.1] Acute CVA (cerebrovascular accident) ([place]) [I63.9] Fever, unspecified fever cause [  R50.9] Cerebrovascular accident (CVA), unspecified mechanism ([place]) [I63.9] Patient Active Problem List   Diagnosis Date Noted   Fever 10/08/2021   Acute CVA (cerebrovascular accident) ([place]) 10/07/2021   Hematochezia 07/11/2021   Abdominal pain  05/09/2021   Abnormal bowel movement 05/09/2021   Epigastric pain 05/09/2021   Iron deficiency anemia due to chronic blood loss 05/09/2021   Moderate late onset Alzheimer's dementia without behavioral disturbance, psychotic disturbance, mood disturbance, or anxiety ([place]) 01/30/2021   Agitation 11/15/2020   Acute idiopathic gout of right hand 02/23/2020   Calcium pyrophosphate arthropathy 02/23/2020   Chronic kidney disease 02/23/2020   Family history of gout 02/23/2020   Gout 02/23/2020   History of hip fracture 02/23/2020   Long term (current) use of aspirin 02/23/2020   Dementia ([place]) 04/08/2017   Anemia of chronic disease 06/18/2016   Anemia of chronic kidney failure 06/18/2016   Cognitive impairment 03/19/2016   Recurrent cellulitis of lower extremity 01/31/2016   Self-care deficit in patient living alone 11/08/2015   Foot drop, left 07/18/2015   S/P total knee arthroplasty 05/29/2015   Osteoarthritis 05/04/2015   Venous insufficiency (chronic) (peripheral) 02/02/2013   Urinary incontinence 07/23/2012   Hyperlipidemia 07/23/2012   Controlled type 2 DM with peripheral circulatory disorder ([place]) 07/23/2012   GERD (gastroesophageal reflux disease) 06/29/2011   HTN (hypertension) 06/29/2011   Intertrochanteric fracture of left femur ([place]) 06/29/2011   Hypothyroid 06/29/2011   PCP:  Deland Pretty, MD Pharmacy:   [facility], [place] [address] Phone: [phone] Fax: [place], [place] - [place] [name] [address] [place] [address] Phone: [phone] Fax: [phone]     Social Determinants of Health (SDOH) Interventions    Readmission Risk Interventions     No data to display

## 2021-10-10 DIAGNOSIS — R7881 Bacteremia: Secondary | ICD-10-CM

## 2021-10-10 DIAGNOSIS — I639 Cerebral infarction, unspecified: Secondary | ICD-10-CM | POA: Diagnosis not present

## 2021-10-10 DIAGNOSIS — B965 Pseudomonas (aeruginosa) (mallei) (pseudomallei) as the cause of diseases classified elsewhere: Secondary | ICD-10-CM | POA: Diagnosis not present

## 2021-10-10 DIAGNOSIS — L03116 Cellulitis of left lower limb: Secondary | ICD-10-CM | POA: Diagnosis not present

## 2021-10-10 DIAGNOSIS — F0390 Unspecified dementia without behavioral disturbance: Secondary | ICD-10-CM | POA: Diagnosis not present

## 2021-10-10 DIAGNOSIS — N183 Chronic kidney disease, stage 3 unspecified: Secondary | ICD-10-CM

## 2021-10-10 DIAGNOSIS — N179 Acute kidney failure, unspecified: Secondary | ICD-10-CM

## 2021-10-10 DIAGNOSIS — R509 Fever, unspecified: Secondary | ICD-10-CM | POA: Diagnosis not present

## 2021-10-10 LAB — CULTURE, BLOOD (ROUTINE X 2)

## 2021-10-10 MED ORDER — LEVOTHYROXINE SODIUM 25 MCG PO TABS
25.0000 ug | ORAL_TABLET | Freq: Every day | ORAL | Status: DC
  Administered 2021-10-11: 25 ug via ORAL
  Filled 2021-10-10: qty 1

## 2021-10-10 MED ORDER — MIRABEGRON ER 25 MG PO TB24
25.0000 mg | ORAL_TABLET | Freq: Every day | ORAL | Status: DC
  Administered 2021-10-10 – 2021-10-11 (×2): 25 mg via ORAL
  Filled 2021-10-10 (×2): qty 1

## 2021-10-10 MED ORDER — GABAPENTIN 100 MG PO CAPS
100.0000 mg | ORAL_CAPSULE | Freq: Every day | ORAL | Status: DC
  Administered 2021-10-10 – 2021-10-11 (×2): 100 mg via ORAL
  Filled 2021-10-10 (×2): qty 1

## 2021-10-10 MED ORDER — ALLOPURINOL 100 MG PO TABS
100.0000 mg | ORAL_TABLET | Freq: Every day | ORAL | Status: DC
  Administered 2021-10-10 – 2021-10-11 (×2): 100 mg via ORAL
  Filled 2021-10-10 (×2): qty 1

## 2021-10-10 MED ORDER — HALOPERIDOL 0.5 MG PO TABS
2.0000 mg | ORAL_TABLET | Freq: Every evening | ORAL | Status: DC | PRN
  Administered 2021-10-10: 2 mg via ORAL
  Filled 2021-10-10: qty 4

## 2021-10-10 NOTE — Plan of Care (Signed)
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
## 2021-10-10 NOTE — Progress Notes (Signed)
PROGRESS NOTE    Jessica Hawkins  ZGY:[id number] DOB: 08-27-1922 DOA: 10/07/2021 PCP: Merri Brunette, MD    Brief Narrative:  88 year old lives at home with family and 24/7 caretaker support, has history of hypertension, hypothyroidism, dementia, type 2 diabetes and chronic anemia brought to the ER when she started having chills and rigors.  Recently suffered from right lower leg cellulitis and was treated with doxycycline and improved.  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 Patient was febrile on presentation, found to have Pseudomonas bacteremia that quickly defervesced and blood cultures also cleared. Remains in the hospital on IV antibiotics pending clinical improvement. Intermittently gets confused, has developed  Assessment & Plan:   Pseudomonas bacteremia: Primary source unknown. UA normal.  Respiratory viral panel negative.  Chest x-ray normal.   Blood cultures on admission, Pseudomonas Blood cultures after initial antibiotics, negative so far.   Urine culture on admission was negative.  Repeat urine culture done on 8/8 due to change in color of urine.  Does have mild diffuse cellulitis of legs, no abscess. Initially treated with vancomycin and cefepime.  Continue cefepime. WBC count improving.  Fever improving. With new onset of stroke, there is possibility of bacterial endocarditis however with negative repeat culture and quick improvement of symptoms, given her frailty and developing delirium subjecting her through TEE may not be beneficial.  Discussed in detail with family.  Will discuss with infectious disease team.  Acute right MCA territory stroke: Clinical findings, chills rigors and weakness at home. CT head findings, no acute findings.  Old left frontal infarct. MRI of the brain, small right cerebellum cortical infarct. MRA of the brain, no large vessel occlusion.  No blood clot. Carotid duplexes, no hemodynamically significant obstruction. 2D echocardiogram,  normal ejection fraction.  No vegetations.  No thrombus. Antiplatelet therapy, dual antiplatelet therapy for 3 weeks and will continue Plavix alone. LDL, 82.  Low-dose statin. Hemoglobin A1c, 5.7.  No indication for treatment. With Pseudomonas bacteremia, may benefit with TEE, however TTE not showing any vegetation as well no evidence of persistent bacteremia.  Will not pursue TEE given frailty, debility and age. Neurology following. PT/OT.  At home with 24/7 care.  Will resume home health care.  Essential hypertension: Blood pressure stable.  Hospital-acquired delirium in a patient with dementia: Fall precautions.  Delirium precautions.  Day and night orientation.  Allow family member and or caregiver to stay in the hospital. Will use small dose of haloperidol 2 mg at night as needed.  Avoid benzodiazepines and narcotics.  Called and discussed with infectious disease team.     DVT prophylaxis: enoxaparin (LOVENOX) injection 30 mg Start: 10/08/21 0230   Code Status: Full code Family Communication: Daughter at the bedside.  Caregiver at the bedside. Disposition Plan: Status is: Inpatient Remains inpatient appropriate because: Bacteremia, acute stroke     Consultants:  Neurology  Procedures:  EEG, normal  Antimicrobials:  Vancomycin 8/6-8/7 Cefepime 8/6---   Subjective: Seen and examined.  Overnight with restlessness and agitation.  Today morning forgetful and repeated to but mostly quite calm, full of sense of humor and also very respectful in conversation. Repeatedly asks same questions which is not new for her. Remains afebrile. Discussed in detail with patient's daughter Windell Moulding and her caregiver about managing delirium, investigations ongoing, infection treatment etc.  Objective: Vitals:   10/09/21 1500 10/09/21 2015 10/09/21 2322 10/10/21 0730  BP: 120/78 (!) 157/64 (!) 144/69 (!) 134/54  Pulse: 89 84 96 69  Resp: 16 17 20  18  Temp: 98.8 F (37.1 C) 97.7 F (36.5  C) 97.9 F (36.6 C) 97.8 F (36.6 C)  TempSrc: Oral Oral Oral Oral  SpO2: 99% 100% 97% 100%  Weight:      Height:        Intake/Output Summary (Last 24 hours) at 10/10/2021 1139 Last data filed at 10/10/2021 0536 Gross per 24 hour  Intake 100 ml  Output 450 ml  Net -350 ml   Filed Weights   10/08/21 0135  Weight: 54.4 kg    Examination:  Physical Exam Constitutional:      Comments: Frail debilitated.  She is appropriate for her age.  Forgetful and keeps repeating same sentences as well as questions.  Follows commands.  Very pleasant in conversation.  HENT:     Head: Normocephalic and atraumatic.     Mouth/Throat:     Mouth: Mucous membranes are moist.  Eyes:     Pupils: Pupils are equal, round, and reactive to light.  Cardiovascular:     Rate and Rhythm: Normal rate and regular rhythm.  Pulmonary:     Effort: Pulmonary effort is normal.     Breath sounds: Normal breath sounds.  Abdominal:     General: Abdomen is flat. Bowel sounds are normal.     Palpations: Abdomen is soft.  Musculoskeletal:        General: Normal range of motion.     Cervical back: Neck supple.     Comments: Right leg with some nodular swelling, no tenderness. Left leg with mild swelling and erythema, subcutaneous edema.  No fluctuation.  No evidence of spreading inflammation.   Skin:    General: Skin is warm.  Neurological:     General: No focal deficit present.     Mental Status: She is alert.     Comments: Alert awake and oriented x 1.  Easily reoriented.  Moves all extremities equally.  Psychiatric:     Comments: Slightly impulsive and repetitive.        Data Reviewed: I have personally reviewed following labs and imaging studies  CBC: Recent Labs  Lab 10/07/21 2040 10/08/21 0545 10/09/21 0539  WBC 17.2* 20.3* 11.4*  NEUTROABS 15.9*  --  9.7*  HGB 11.8* 10.4* 9.8*  HCT 36.5 33.4* 30.2*  MCV 85.5 86.3 84.1  PLT 219 189 [id number]   Basic Metabolic Panel: Recent Labs  Lab  10/07/21 2040 10/08/21 0545 10/09/21 0539  NA 136 137 137  K 4.4 4.3 3.7  CL 101 105 105  CO2 26 27 24   GLUCOSE 149* 130* 112*  BUN 53* 45* 34*  CREATININE 1.25* 1.08* 1.00  CALCIUM 10.2 9.1 9.1  MG  --   --  1.9  PHOS  --   --  2.7   GFR: Estimated Creatinine Clearance: 22.8 mL/min (by C-G formula based on SCr of 1 mg/dL). Liver Function Tests: Recent Labs  Lab 10/07/21 2040 10/08/21 0545  AST 33 26  ALT 20 17  ALKPHOS 73 53  BILITOT 0.8 1.0  PROT 7.1 6.2*  ALBUMIN 3.8 2.9*   No results for input(s): "LIPASE", "AMYLASE" in the last 168 hours. No results for input(s): "AMMONIA" in the last 168 hours. Coagulation Profile: No results for input(s): "INR", "PROTIME" in the last 168 hours. Cardiac Enzymes: No results for input(s): "CKTOTAL", "CKMB", "CKMBINDEX", "TROPONINI" in the last 168 hours. BNP (last 3 results) No results for input(s): "PROBNP" in the last 8760 hours. HbA1C: Recent Labs    10/08/21 0545  HGBA1C 5.7*   CBG: No results for input(s): "GLUCAP" in the last 168 hours. Lipid Profile: Recent Labs    10/08/21 0545  CHOL 154  HDL 67  LDLCALC 82  TRIG 26  CHOLHDL 2.3   Thyroid Function Tests: No results for input(s): "TSH", "T4TOTAL", "FREET4", "T3FREE", "THYROIDAB" in the last 72 hours. Anemia Panel: No results for input(s): "VITAMINB12", "FOLATE", "FERRITIN", "TIBC", "IRON", "RETICCTPCT" in the last 72 hours. Sepsis Labs: Recent Labs  Lab 10/07/21 2040 10/08/21 0000  LATICACIDVEN 1.1 1.0    Recent Results (from the past 240 hour(s))  Urine Culture     Status: None   Collection Time: 10/07/21  8:22 PM   Specimen: In/Out Cath Urine  Result Value Ref Range Status   Specimen Description IN/OUT CATH URINE  Final   Special Requests NONE  Final   Culture   Final    NO GROWTH Performed at [facility], [place] [address], [place], [place] [address]    Report Status 10/08/2021 FINAL  Final  SARS Coronavirus 2 by RT PCR (hospital order,  performed in [facility] hospital lab) *cepheid single result test* Anterior Nasal Swab     Status: None   Collection Time: 10/07/21  8:40 PM   Specimen: Anterior Nasal Swab  Result Value Ref Range Status   SARS Coronavirus 2 by RT PCR NEGATIVE NEGATIVE Final    Comment: (NOTE) SARS-CoV-2 target nucleic acids are NOT DETECTED.  The SARS-CoV-2 RNA is generally detectable in upper and lower respiratory specimens during the acute phase of infection. The lowest concentration of SARS-CoV-2 viral copies this assay can detect is 250 copies / mL. A negative result does not preclude SARS-CoV-2 infection and should not be used as the sole basis for treatment or other patient management decisions.  A negative result may occur with improper specimen collection / handling, submission of specimen other than nasopharyngeal swab, presence of viral mutation(s) within the areas targeted by this assay, and inadequate number of viral copies (<250 copies / mL). A negative result must be combined with clinical observations, patient history, and epidemiological information.  Fact Sheet for Patients:   https://www.patel.info/  Fact Sheet for Healthcare Providers: https://hall.com/  This test is not yet approved or  cleared by the [place] FDA and has been authorized for detection and/or diagnosis of SARS-CoV-2 by FDA under an Emergency Use Authorization (EUA).  This EUA will remain in effect (meaning this test can be used) for the duration of the COVID-19 declaration under Section 564(b)(1) of the Act, 21 U.S.C. section 360bbb-3(b)(1), unless the authorization is terminated or revoked sooner.  Performed at [facility], [place] [address] [place], [place] [address]   Blood culture (routine x 2)     Status: Abnormal   Collection Time: 10/07/21  8:40 PM   Specimen: BLOOD  Result Value Ref Range Status   Specimen Description BLOOD RIGHT ANTECUBITAL   Final   Special Requests   Final    BOTTLES DRAWN AEROBIC AND ANAEROBIC Blood Culture results may not be optimal due to an inadequate volume of blood received in culture bottles   Culture  Setup Time   Final    GRAM NEGATIVE RODS BOTTLES DRAWN AEROBIC ONLY CRITICAL RESULT CALLED TO, READ BACK BY AND VERIFIED WITH: PHARMD C. AMEND [phone] @1806  FH Performed at [facility], [place] [address] [place], [place] [address]    Culture PSEUDOMONAS AERUGINOSA (A)  Final   Report Status 10/10/2021 FINAL  Final   Organism ID, Bacteria PSEUDOMONAS AERUGINOSA  Final      Susceptibility   Pseudomonas aeruginosa - MIC*    CEFTAZIDIME 4 SENSITIVE Sensitive     CIPROFLOXACIN <=0.25 SENSITIVE Sensitive     GENTAMICIN <=1 SENSITIVE Sensitive     IMIPENEM 2 SENSITIVE Sensitive     PIP/TAZO 8 SENSITIVE Sensitive     CEFEPIME 2 SENSITIVE Sensitive     * PSEUDOMONAS AERUGINOSA  Blood Culture ID Panel (Reflexed)     Status: Abnormal   Collection Time: 10/07/21  8:40 PM  Result Value Ref Range Status   Enterococcus faecalis NOT DETECTED NOT DETECTED Final   Enterococcus Faecium NOT DETECTED NOT DETECTED Final   Listeria monocytogenes NOT DETECTED NOT DETECTED Final   Staphylococcus species NOT DETECTED NOT DETECTED Final   Staphylococcus aureus (BCID) NOT DETECTED NOT DETECTED Final   Staphylococcus epidermidis NOT DETECTED NOT DETECTED Final   Staphylococcus lugdunensis NOT DETECTED NOT DETECTED Final   Streptococcus species NOT DETECTED NOT DETECTED Final   Streptococcus agalactiae NOT DETECTED NOT DETECTED Final   Streptococcus pneumoniae NOT DETECTED NOT DETECTED Final   Streptococcus pyogenes NOT DETECTED NOT DETECTED Final   A.calcoaceticus-baumannii NOT DETECTED NOT DETECTED Final   Bacteroides fragilis NOT DETECTED NOT DETECTED Final   Enterobacterales NOT DETECTED NOT DETECTED Final   Enterobacter cloacae complex NOT DETECTED NOT DETECTED Final   Escherichia coli NOT DETECTED NOT DETECTED  Final   Klebsiella aerogenes NOT DETECTED NOT DETECTED Final   Klebsiella oxytoca NOT DETECTED NOT DETECTED Final   Klebsiella pneumoniae NOT DETECTED NOT DETECTED Final   Proteus species NOT DETECTED NOT DETECTED Final   Salmonella species NOT DETECTED NOT DETECTED Final   Serratia marcescens NOT DETECTED NOT DETECTED Final   Haemophilus influenzae NOT DETECTED NOT DETECTED Final   Neisseria meningitidis NOT DETECTED NOT DETECTED Final   Pseudomonas aeruginosa DETECTED (A) NOT DETECTED Final    Comment: CRITICAL RESULT CALLED TO, READ BACK BY AND VERIFIED WITH: PHARMD C. AMEND [phone] @1806  FH    Stenotrophomonas maltophilia NOT DETECTED NOT DETECTED Final   Candida albicans NOT DETECTED NOT DETECTED Final   Candida auris NOT DETECTED NOT DETECTED Final   Candida glabrata NOT DETECTED NOT DETECTED Final   Candida krusei NOT DETECTED NOT DETECTED Final   Candida parapsilosis NOT DETECTED NOT DETECTED Final   Candida tropicalis NOT DETECTED NOT DETECTED Final   Cryptococcus neoformans/gattii NOT DETECTED NOT DETECTED Final   CTX-M ESBL NOT DETECTED NOT DETECTED Final   Carbapenem resistance IMP NOT DETECTED NOT DETECTED Final   Carbapenem resistance KPC NOT DETECTED NOT DETECTED Final   Carbapenem resistance NDM NOT DETECTED NOT DETECTED Final   Carbapenem resistance VIM NOT DETECTED NOT DETECTED Final    Comment: Performed at [facility], [place] [address], [place], [place] [address]  Blood culture (routine x 2)     Status: None (Preliminary result)   Collection Time: 10/08/21 12:01 AM   Specimen: BLOOD RIGHT WRIST  Result Value Ref Range Status   Specimen Description BLOOD RIGHT WRIST  Final   Special Requests   Final    BOTTLES DRAWN AEROBIC AND ANAEROBIC Blood Culture adequate volume   Culture   Final    NO GROWTH 2 DAYS Performed at [facility], [place] [address], [place],  [address]    Report Status PENDING  Incomplete         Radiology Studies: EEG  adult  Result Date: 10/08/2021 Hortense Ramal,  Kristopher Oppenheim, MD     10/08/2021  4:50 PM Patient Name: Jessica Hawkins MRN: [id number] Epilepsy Attending: Charlsie Quest Referring Physician/Provider: Marvel Plan, MD Date: 10/08/2021 Duration: 22.12 mins Patient history:  88yo F with acute small subdural cortical infarct. EEG to evaluate for seizure Level of alertness: Awake AEDs during EEG study: None Technical aspects: This EEG study was done with scalp electrodes positioned according to the 10-20 International system of electrode placement. Electrical activity was reviewed with band pass filter of 1-70Hz , sensitivity of 7 uV/mm, display speed of 89mm/sec with a 60Hz  notched filter applied as appropriate. EEG data were recorded continuously and digitally stored.  Video monitoring was available and reviewed as appropriate. Description: The posterior dominant rhythm consists of 8 Hz activity of moderate voltage (25-35 uV) seen predominantly in posterior head regions, symmetric and reactive to eye opening and eye closing. EEG showed intermittent generalized 3 to 6 Hz theta-delta slowing.  Hyperventilation and photic stimulation were not performed.   ABNORMALITY - Intermittent slow, generalized IMPRESSION: This study is suggestive of mild diffuse encephalopathy, nonspecific etiology. No seizures or epileptiform discharges were seen throughout the recording.   ECHOCARDIOGRAM COMPLETE  Result Date: 10/08/2021    ECHOCARDIOGRAM REPORT   Patient Name:   Jessica Hawkins Date of Exam: 10/08/2021 Medical Rec #:  12/08/2021       Height:       56.3 in Accession #:    [id number]      Weight:       120.0 lb Date of Birth:  [birth year]       BSA:          1.434 m Patient Age:    89 years        BP:           133/68 mmHg Patient Gender: F               HR:           75 bpm. Exam Location:  Inpatient Procedure: 2D Echo Indications:    stroke  History:        Patient has no prior history of Echocardiogram examinations.                  Chronic kidney disease, Signs/Symptoms:Fever; Risk                 Factors:Hypertension, Dyslipidemia and Diabetes.  Sonographer:    12/13/1922 RDCS Sonographer#2:  Delcie Roch Referring Phys: Aron Baba [id number] IMPRESSIONS  1. Left ventricular ejection fraction, by estimation, is 65 to 70%. The left ventricle has normal function. The left ventricle has no regional wall motion abnormalities. Left ventricular diastolic parameters are consistent with Grade I diastolic dysfunction (impaired relaxation).  2. Right ventricular systolic function is normal. The right ventricular size is normal. There is mildly elevated pulmonary artery systolic pressure. The estimated right ventricular systolic pressure is 44.0 mmHg.  3. Left atrial size was moderately dilated.  4. Right atrial size was mildly dilated.  5. The mitral valve is normal in structure. No evidence of mitral valve regurgitation. No evidence of mitral stenosis.  6. Tricuspid valve regurgitation is moderate.  7. The aortic valve is tricuspid. There is moderate calcification of the aortic valve. There is moderate thickening of the aortic valve. Aortic valve regurgitation is moderate. Aortic valve sclerosis is present, with no evidence of aortic valve stenosis.  8. The inferior vena cava is dilated in size  with >50% respiratory variability, suggesting right atrial pressure of 8 mmHg. Conclusion(s)/Recommendation(s): No intracardiac source of embolism detected on this transthoracic study. Consider a transesophageal echocardiogram to exclude cardiac source of embolism if clinically indicated. FINDINGS  Left Ventricle: Left ventricular ejection fraction, by estimation, is 65 to 70%. The left ventricle has normal function. The left ventricle has no regional wall motion abnormalities. The left ventricular internal cavity size was normal in size. There is  no left ventricular hypertrophy. Left ventricular diastolic parameters are consistent with Grade I  diastolic dysfunction (impaired relaxation). Right Ventricle: The right ventricular size is normal. No increase in right ventricular wall thickness. Right ventricular systolic function is normal. There is mildly elevated pulmonary artery systolic pressure. The tricuspid regurgitant velocity is 3.00  m/s, and with an assumed right atrial pressure of 8 mmHg, the estimated right ventricular systolic pressure is 44.0 mmHg. Left Atrium: Left atrial size was moderately dilated. Right Atrium: Right atrial size was mildly dilated. Pericardium: There is no evidence of pericardial effusion. Mitral Valve: The mitral valve is normal in structure. No evidence of mitral valve regurgitation. No evidence of mitral valve stenosis. Tricuspid Valve: The tricuspid valve is normal in structure. Tricuspid valve regurgitation is moderate . No evidence of tricuspid stenosis. Aortic Valve: The aortic valve is tricuspid. There is moderate calcification of the aortic valve. There is moderate thickening of the aortic valve. Aortic valve regurgitation is moderate. Aortic regurgitation PHT measures 318 msec. Aortic valve sclerosis  is present, with no evidence of aortic valve stenosis. Aortic valve mean gradient measures 11.0 mmHg. Aortic valve peak gradient measures 19.8 mmHg. Aortic valve area, by VTI measures 2.38 cm. Pulmonic Valve: The pulmonic valve was normal in structure. Pulmonic valve regurgitation is mild. No evidence of pulmonic stenosis. Aorta: The aortic root is normal in size and structure. Venous: The inferior vena cava is dilated in size with greater than 50% respiratory variability, suggesting right atrial pressure of 8 mmHg. IAS/Shunts: No atrial level shunt detected by color flow Doppler.  LEFT VENTRICLE PLAX 2D LVIDd:         4.50 cm   Diastology LVIDs:         3.00 cm   LV e' medial:    5.55 cm/s LV PW:         1.00 cm   LV E/e' medial:  17.8 LV IVS:        0.90 cm   LV e' lateral:   8.38 cm/s LVOT diam:     2.00 cm   LV  E/e' lateral: 11.8 LV SV:         94 LV SV Index:   66 LVOT Area:     3.14 cm  RIGHT VENTRICLE             IVC RV Basal diam:  2.80 cm     IVC diam: 2.00 cm RV S prime:     14.50 cm/s TAPSE (M-mode): 2.9 cm LEFT ATRIUM             Index        RIGHT ATRIUM           Index LA diam:        3.20 cm 2.23 cm/m   RA Area:     15.90 cm LA Vol (A2C):   68.3 ml 47.63 ml/m  RA Volume:   35.90 ml  25.04 ml/m LA Vol (A4C):   50.3 ml 35.08 ml/m LA Biplane Vol: 62.8 ml 43.80  ml/m  AORTIC VALVE AV Area (Vmax):    2.15 cm AV Area (Vmean):   2.04 cm AV Area (VTI):     2.38 cm AV Vmax:           222.50 cm/s AV Vmean:          151.000 cm/s AV VTI:            0.395 m AV Peak Grad:      19.8 mmHg AV Mean Grad:      11.0 mmHg LVOT Vmax:         152.00 cm/s LVOT Vmean:        98.100 cm/s LVOT VTI:          0.299 m LVOT/AV VTI ratio: 0.76 AI PHT:            318 msec  AORTA Ao Asc diam: 3.20 cm MITRAL VALVE                TRICUSPID VALVE MV Area (PHT): 4.57 cm     TR Peak grad:   36.0 mmHg MV Decel Time: 166 msec     TR Vmax:        300.00 cm/s MV E velocity: 99.00 cm/s MV A velocity: 119.00 cm/s  SHUNTS MV E/A ratio:  0.83         Systemic VTI:  0.30 m                             Systemic Diam: 2.00 cm Candee Furbish MD Electronically signed by Candee Furbish MD Signature Date/Time: 10/08/2021/12:07:00 PM    Final         Scheduled Meds:  allopurinol  100 mg Oral Daily   aspirin  81 mg Oral Daily   Or   aspirin  300 mg Rectal Daily   clopidogrel  75 mg Oral Daily   enoxaparin (LOVENOX) injection  30 mg Subcutaneous Q24H   gabapentin  100 mg Oral Daily   [START ON 10/11/2021] levothyroxine  25 mcg Oral QAC breakfast   mirabegron ER  25 mg Oral Daily   pravastatin  20 mg Oral q1800   Continuous Infusions:  ceFEPime (MAXIPIME) IV Stopped (10/09/21 2254)     LOS: 3 days    Time spent: 35 minutes    Barb Merino, MD [facility] Pager [phone]

## 2021-10-10 NOTE — Progress Notes (Signed)
Physical Therapy Treatment Patient Details Name: Jessica Hawkins MRN: [id number] DOB: Mar 05, 1922 Today's Date: 10/10/2021   History of Present Illness Pt is a 88 y/o female who presents 10/07/2021 with chills and rigors. 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 PMH significant for R BBB, CKD, HTN, hypothyroidism, OA, osteoporosis, DM II, memory deficits, B IM nail, ORIF R hip 2002, L rotator cuff repair 1999, B THR 2017/ 2018.    PT Comments    Pt progressing towards physical therapy goals. Was able to perform transfers and ambulation with up to +2 max assist, progressing to less assist as session progressed. Pt motivated to ambulate and was able to achieve ~75 feet with the RW and a chair follow. Pt somewhat internally distracted by wanting to sit on the [facility] to void, but was able to be redirected after initially attempting without success. Pt was left on the [facility] for second try with caregiver present. NT updated that pt on BSC. Family/caregiver educated that pt was seen by PT/OT on Monday and is currently a 3x/week frequency for PT. Educated on recommendations for home health recommendations, and encouraged them to try and schedule therapy sessions on staggered days to increase opportunity for daily mobility. Will continue to follow and progress as able per POC.    Recommendations for follow up therapy are one component of a multi-disciplinary discharge planning process, led by the attending physician.  Recommendations may be updated based on patient status, additional functional criteria and insurance authorization.  Follow Up Recommendations  Home health PT     Assistance Recommended at Discharge Frequent or constant Supervision/Assistance  Patient can return home with the following Two people to help with walking and/or transfers;Two people to help with bathing/dressing/bathroom;Assistance with cooking/housework;Direct supervision/assist for medications management;Help with stairs or ramp  for entrance;Assist for transportation   Equipment Recommendations  None recommended by PT    Recommendations for Other Services       Precautions / Restrictions Precautions Precautions: Fall Precaution Comments: Heavy posterior bias initially upon stand. Restrictions Weight Bearing Restrictions: No     Mobility  Bed Mobility Overal bed mobility: Needs Assistance Bed Mobility: Supine to Sit     Supine to sit: Mod assist     General bed mobility comments: Assist for transition fully to EOB. Increased time to scoot out and get feet fully on the floor.    Transfers Overall transfer level: Needs assistance Equipment used: Rolling walker (2 wheels) Transfers: Sit to/from Stand, Bed to chair/wheelchair/BSC Sit to Stand: Max assist, Mod assist, +2 physical assistance   Step pivot transfers: Mod assist, +2 physical assistance, +2 safety/equipment       General transfer comment: Initially requiring +2 max assist to power up to full stand and facilitate anterior lean for balance. Progressed to +2 mod assist by end of session however still with increased time to gain/maintain standing balance.    Ambulation/Gait Ambulation/Gait assistance: Min assist, +2 safety/equipment Gait Distance (Feet): 75 Feet Assistive device: Rolling walker (2 wheels) Gait Pattern/deviations: Step-through pattern, Decreased stride length, Trunk flexed, Narrow base of support Gait velocity: Decreased Gait velocity interpretation: <1.31 ft/sec, indicative of household ambulator   General Gait Details: Very slow gait speed with flexed trunk, however able to make corrective changes with frequent multimodal cues. Occasional min assist for balance support and walker management. Chair follow utilized.   Stairs             Wheelchair Mobility    Modified Rankin (Stroke  Patients Only) Modified Rankin (Stroke Patients Only) Pre-Morbid Rankin Score: Moderately severe disability Modified Rankin:  Moderately severe disability     Balance Overall balance assessment: Needs assistance Sitting-balance support: Feet supported, Bilateral upper extremity supported Sitting balance-Leahy Scale: Poor     Standing balance support: Bilateral upper extremity supported, During functional activity, Reliant on assistive device for balance Standing balance-Leahy Scale: Poor                              Cognition Arousal/Alertness: Awake/alert Behavior During Therapy: Flat affect Overall Cognitive Status: History of cognitive impairments - at baseline Area of Impairment: Orientation, Attention, Memory, Following commands, Safety/judgement, Awareness, Problem solving                 Orientation Level: Disoriented to, Time, Situation Current Attention Level: Focused Memory: Decreased short-term memory Following Commands: Follows one step commands inconsistently, Follows one step commands with increased time Safety/Judgement: Decreased awareness of deficits, Decreased awareness of safety Awareness: Intellectual Problem Solving: Slow processing, Decreased initiation, Difficulty sequencing, Requires verbal cues, Requires tactile cues          Exercises      General Comments        Pertinent Vitals/Pain Pain Assessment Pain Assessment: No/denies pain    Home Living                          Prior Function            PT Goals (current goals can now be found in the care plan section) Acute Rehab PT Goals Patient Stated Goal: Be able to go home. Eager for d/c. PT Goal Formulation: Patient unable to participate in goal setting Time For Goal Achievement: 10/22/21 Potential to Achieve Goals: Good Progress towards PT goals: Progressing toward goals    Frequency    Min 3X/week      PT Plan Current plan remains appropriate    Co-evaluation PT/OT/SLP Co-Evaluation/Treatment: Yes Reason for Co-Treatment: For patient/therapist safety;To address  functional/ADL transfers PT goals addressed during session: Mobility/safety with mobility;Balance;Proper use of DME        AM-PAC PT "6 Clicks" Mobility   Outcome Measure  Help needed turning from your back to your side while in a flat bed without using bedrails?: A Lot Help needed moving from lying on your back to sitting on the side of a flat bed without using bedrails?: A Lot Help needed moving to and from a bed to a chair (including a wheelchair)?: Total Help needed standing up from a chair using your arms (e.g., wheelchair or bedside chair)?: Total Help needed to walk in hospital room?: Total Help needed climbing 3-5 steps with a railing? : Total 6 Click Score: 8    End of Session Equipment Utilized During Treatment: Gait belt Activity Tolerance: Patient tolerated treatment well Patient left: in chair;with call bell/phone within reach;with family/visitor present Nurse Communication: Mobility status PT Visit Diagnosis: Unsteadiness on feet (R26.81);Muscle weakness (generalized) (M62.81);Other symptoms and signs involving the nervous system (R29.898)     Time: 4481-8563 PT Time Calculation (min) (ACUTE ONLY): 45 min  Charges:  $Gait Training: 23-37 mins                     Conni Slipper, PT, DPT Acute Rehabilitation Services Secure Chat Preferred Office: [phone]    Marylynn Pearson 10/10/2021, 11:54 AM

## 2021-10-10 NOTE — Progress Notes (Signed)
STROKE TEAM PROGRESS NOTE   SUBJECTIVE (INTERVAL HISTORY) Caregiver is at the bedside. Pt lying in bed, initially sleepy but easily arousable. Overnight no acute issue. 1/2 blood culture showed pseudomonas. Now on cefepime. ID will be consulted   OBJECTIVE Temp:  [97.7 F (36.5 C)-98.8 F (37.1 C)] 97.9 F (36.6 C) (08/09 1146) Pulse Rate:  [69-96] 74 (08/09 1146) Cardiac Rhythm: Normal sinus rhythm;Bundle branch block (08/09 0804) Resp:  [16-20] 18 (08/09 1146) BP: (120-157)/(54-78) 138/56 (08/09 1146) SpO2:  [97 %-100 %] 98 % (08/09 1146)  No results for input(s): "GLUCAP" in the last 168 hours. Recent Labs  Lab 10/07/21 2040 10/08/21 0545 10/09/21 0539  NA 136 137 137  K 4.4 4.3 3.7  CL 101 105 105  CO2 26 27 24   GLUCOSE 149* 130* 112*  BUN 53* 45* 34*  CREATININE 1.25* 1.08* 1.00  CALCIUM 10.2 9.1 9.1  MG  --   --  1.9  PHOS  --   --  2.7   Recent Labs  Lab 10/07/21 2040 10/08/21 0545  AST 33 26  ALT 20 17  ALKPHOS 73 53  BILITOT 0.8 1.0  PROT 7.1 6.2*  ALBUMIN 3.8 2.9*   Recent Labs  Lab 10/07/21 2040 10/08/21 0545 10/09/21 0539  WBC 17.2* 20.3* 11.4*  NEUTROABS 15.9*  --  9.7*  HGB 11.8* 10.4* 9.8*  HCT 36.5 33.4* 30.2*  MCV 85.5 86.3 84.1  PLT 219 189 171   No results for input(s): "CKTOTAL", "CKMB", "CKMBINDEX", "TROPONINI" in the last 168 hours. No results for input(s): "LABPROT", "INR" in the last 72 hours. Recent Labs    10/07/21 2022  COLORURINE YELLOW  LABSPEC 1.013  PHURINE 6.0  GLUCOSEU NEGATIVE  HGBUR MODERATE*  BILIRUBINUR NEGATIVE  KETONESUR NEGATIVE  PROTEINUR 100*  NITRITE NEGATIVE  LEUKOCYTESUR NEGATIVE       Component Value Date/Time   CHOL 154 10/08/2021 0545   CHOL 143 05/05/2014 1037   TRIG 26 10/08/2021 0545   HDL 67 10/08/2021 0545   HDL 72 05/05/2014 1037   CHOLHDL 2.3 10/08/2021 0545   VLDL 5 10/08/2021 0545   LDLCALC 82 10/08/2021 0545   LDLCALC 65 05/05/2014 1037   Lab Results  Component Value Date    HGBA1C 5.7 (H) 10/08/2021      Component Value Date/Time   LABOPIA NONE DETECTED 06/29/2011 1441   COCAINSCRNUR NONE DETECTED 06/29/2011 1441   LABBENZ NONE DETECTED 06/29/2011 1441   AMPHETMU NONE DETECTED 06/29/2011 1441   THCU NONE DETECTED 06/29/2011 1441   LABBARB NONE DETECTED 06/29/2011 1441    No results for input(s): "ETH" in the last 168 hours.  I have personally reviewed the radiological images below and agree with the radiology interpretations.  VAS US CAROTID  Result Date: 10/09/2021 Carotid Arterial Duplex Study Patient Name:  Jessica Hawkins  Date of Exam:   10/08/2021 Medical Rec #: [id number]        Accession #:    [id number] Date of Birth: October 28, 1922        Patient Gender: F Patient Age:   34 years Exam Location:  [facility] Procedure:      VAS US CAROTID Referring Phys: Gean Birchwood --------------------------------------------------------------------------------  Indications:      CVA and Speech disturbance. Risk Factors:     Hypertension, Diabetes. Other Factors:    Dementia, CKD. Limitations       Today's exam was limited due to altered mental status,  tortuosity. Comparison Study: No prior study on file Performing Technologist: Sharion Dove RVS  Examination Guidelines: A complete evaluation includes B-mode imaging, spectral Doppler, color Doppler, and power Doppler as needed of all accessible portions of each vessel. Bilateral testing is considered an integral part of a complete examination. Limited examinations for reoccurring indications may be performed as noted.  Right Carotid Findings: +----------+--------+--------+--------+------------------+------------------+           PSV cm/sEDV cm/sStenosisPlaque DescriptionComments           +----------+--------+--------+--------+------------------+------------------+ CCA Prox  24      3                                 intimal thickening  +----------+--------+--------+--------+------------------+------------------+ CCA Distal40      6                                 intimal thickening +----------+--------+--------+--------+------------------+------------------+ ICA Prox  53      13              heterogenous                         +----------+--------+--------+--------+------------------+------------------+ ICA Mid   71      8                                 tortuous           +----------+--------+--------+--------+------------------+------------------+ ICA Distal70      12                                tortuous           +----------+--------+--------+--------+------------------+------------------+ ECA       63      9                                                    +----------+--------+--------+--------+------------------+------------------+ +----------+--------+-------+--------+-------------------+           PSV cm/sEDV cmsDescribeArm Pressure (mmHG) +----------+--------+-------+--------+-------------------+ Subclavian50                                         +----------+--------+-------+--------+-------------------+ +---------+--------+--+--------+-+ VertebralPSV cm/s41EDV cm/s1 +---------+--------+--+--------+-+  Left Carotid Findings: +----------+--------+--------+--------+------------------+------------------+           PSV cm/sEDV cm/sStenosisPlaque DescriptionComments           +----------+--------+--------+--------+------------------+------------------+ CCA Prox  50      4                                 intimal thickening +----------+--------+--------+--------+------------------+------------------+ CCA Distal50      12                                intimal thickening +----------+--------+--------+--------+------------------+------------------+ ICA Prox  44      8  heterogenous      tortuous            +----------+--------+--------+--------+------------------+------------------+ ICA Mid   42      7                                 tortuous           +----------+--------+--------+--------+------------------+------------------+ ICA Distal44      5                                 tortuous           +----------+--------+--------+--------+------------------+------------------+ ECA       65      4                                                    +----------+--------+--------+--------+------------------+------------------+ +----------+--------+--------+--------+-------------------+           PSV cm/sEDV cm/sDescribeArm Pressure (mmHG) +----------+--------+--------+--------+-------------------+ [id number]                                          +----------+--------+--------+--------+-------------------+ +---------+--------+--+--------+-+ VertebralPSV cm/s48EDV cm/s4 +---------+--------+--+--------+-+   Summary: Right Carotid: The extracranial vessels were near-normal with only minimal wall                thickening or plaque. Left Carotid: The extracranial vessels were near-normal with only minimal wall               thickening or plaque. Vertebrals:  Bilateral vertebral arteries demonstrate antegrade flow. Subclavians: Normal flow hemodynamics were seen in bilateral subclavian              arteries. *See table(s) above for measurements and observations.  Electronically signed by Delia Heady MD on 10/09/2021 at 8:18:28 AM.    Final    EEG adult  Result Date: 10/08/2021 Charlsie Quest, MD     10/08/2021  4:50 PM Patient Name: Jessica Hawkins MRN: [id number] Epilepsy Attending: Charlsie Quest Referring Physician/Provider: Marvel Plan, MD Date: 10/08/2021 Duration: 22.12 mins Patient history:  88yo F with acute small subdural cortical infarct. EEG to evaluate for seizure Level of alertness: Awake AEDs during EEG study: None Technical aspects: This EEG study was done with scalp  electrodes positioned according to the 10-20 International system of electrode placement. Electrical activity was reviewed with band pass filter of 1-70Hz , sensitivity of 7 uV/mm, display speed of 60mm/sec with a 60Hz  notched filter applied as appropriate. EEG data were recorded continuously and digitally stored.  Video monitoring was available and reviewed as appropriate. Description: The posterior dominant rhythm consists of 8 Hz activity of moderate voltage (25-35 uV) seen predominantly in posterior head regions, symmetric and reactive to eye opening and eye closing. EEG showed intermittent generalized 3 to 6 Hz theta-delta slowing.  Hyperventilation and photic stimulation were not performed.   ABNORMALITY - Intermittent slow, generalized IMPRESSION: This study is suggestive of mild diffuse encephalopathy, nonspecific etiology. No seizures or epileptiform discharges were seen throughout the recording. Priyanka O Yadav   VAS LOWER EXTREMITY VENOUS (DVT)  Result Date: 10/08/2021  Lower Venous DVT Study Patient  Name:  TELESHIA AMMONS  Date of Exam:   10/08/2021 Medical Rec #: [id number]        Accession #:    [id number] Date of Birth: [birth year]        Patient Gender: F Patient Age:   71 years Exam Location:  [facility] Procedure:      VAS [place] LOWER EXTREMITY VENOUS (DVT) Referring Phys: Cornelius Moras Jahmire Ruffins --------------------------------------------------------------------------------  Indications: Stroke.  Limitations: Altered mental status, unable to position patient's legs adequately and body habitus. Comparison Study: Prior negative left LEV done 03/23/18 Performing Technologist: Sharion Dove RVS  Examination Guidelines: A complete evaluation includes B-mode imaging, spectral Doppler, color Doppler, and power Doppler as needed of all accessible portions of each vessel. Bilateral testing is considered an integral part of a complete examination. Limited examinations for reoccurring indications may be performed  as noted. The reflux portion of the exam is performed with the patient in reverse Trendelenburg.  +---------+---------------+---------+-----------+----------+-------------------+ RIGHT    CompressibilityPhasicitySpontaneityPropertiesThrombus Aging      +---------+---------------+---------+-----------+----------+-------------------+ CFV      Full           Yes      Yes                                      +---------+---------------+---------+-----------+----------+-------------------+ SFJ      Full                                                             +---------+---------------+---------+-----------+----------+-------------------+ FV Prox  Full                                                             +---------+---------------+---------+-----------+----------+-------------------+ FV Mid   Full                                                             +---------+---------------+---------+-----------+----------+-------------------+ FV DistalFull                                                             +---------+---------------+---------+-----------+----------+-------------------+ PFV      Full                                                             +---------+---------------+---------+-----------+----------+-------------------+ POP                     Yes      Yes  patent by color and                                                       Doppler             +---------+---------------+---------+-----------+----------+-------------------+ PTV                                                   Not well visualized +---------+---------------+---------+-----------+----------+-------------------+ PERO                                                  Not well visualized +---------+---------------+---------+-----------+----------+-------------------+    +---------+---------------+---------+-----------+----------+-------------------+ LEFT     CompressibilityPhasicitySpontaneityPropertiesThrombus Aging      +---------+---------------+---------+-----------+----------+-------------------+ CFV      Full                                                             +---------+---------------+---------+-----------+----------+-------------------+ SFJ      Full                                                             +---------+---------------+---------+-----------+----------+-------------------+ FV Prox  Full           Yes      Yes                                      +---------+---------------+---------+-----------+----------+-------------------+ FV Mid   Full                                                             +---------+---------------+---------+-----------+----------+-------------------+ FV DistalFull                                                             +---------+---------------+---------+-----------+----------+-------------------+ PFV      Full           Yes      Yes                                      +---------+---------------+---------+-----------+----------+-------------------+ POP  patent by color and                                                       Doppler             +---------+---------------+---------+-----------+----------+-------------------+ PTV                                                   Not well visualized +---------+---------------+---------+-----------+----------+-------------------+ PERO                                                  Not well visualized +---------+---------------+---------+-----------+----------+-------------------+     Summary: RIGHT: - There is no evidence of deep vein thrombosis in the lower extremity. However, portions of this examination were limited- see technologist  comments above.  LEFT: - There is no evidence of deep vein thrombosis in the lower extremity. However, portions of this examination were limited- see technologist comments above.  *See table(s) above for measurements and observations. Electronically signed by Servando Snare MD on 10/08/2021 at 1:35:46 PM.    Final    ECHOCARDIOGRAM COMPLETE  Result Date: 10/08/2021    ECHOCARDIOGRAM REPORT   Patient Name:   Jessica Hawkins Date of Exam: 10/08/2021 Medical Rec #:  [id number]       Height:       56.3 in Accession #:    [id number]      Weight:       120.0 lb Date of Birth:  [birth year]       BSA:          1.434 m Patient Age:    61 years        BP:           133/68 mmHg Patient Gender: F               HR:           75 bpm. Exam Location:  Inpatient Procedure: 2D Echo Indications:    stroke  History:        Patient has no prior history of Echocardiogram examinations.                 Chronic kidney disease, Signs/Symptoms:Fever; Risk                 Factors:Hypertension, Dyslipidemia and Diabetes.  Sonographer:    Johny Chess RDCS Sonographer#2:  Greer Pickerel Referring Phys: [facility]  1. Left ventricular ejection fraction, by estimation, is 65 to 70%. The left ventricle has normal function. The left ventricle has no regional wall motion abnormalities. Left ventricular diastolic parameters are consistent with Grade I diastolic dysfunction (impaired relaxation).  2. Right ventricular systolic function is normal. The right ventricular size is normal. There is mildly elevated pulmonary artery systolic pressure. The estimated right ventricular systolic pressure is 123XX123 mmHg.  3. Left atrial size was moderately dilated.  4. Right atrial size was mildly dilated.  5. The mitral valve is normal in structure. No evidence of mitral valve regurgitation. No  evidence of mitral stenosis.  6. Tricuspid valve regurgitation is moderate.  7. The aortic valve is tricuspid. There is moderate calcification of the  aortic valve. There is moderate thickening of the aortic valve. Aortic valve regurgitation is moderate. Aortic valve sclerosis is present, with no evidence of aortic valve stenosis.  8. The inferior vena cava is dilated in size with >50% respiratory variability, suggesting right atrial pressure of 8 mmHg. Conclusion(s)/Recommendation(s): No intracardiac source of embolism detected on this transthoracic study. Consider a transesophageal echocardiogram to exclude cardiac source of embolism if clinically indicated. FINDINGS  Left Ventricle: Left ventricular ejection fraction, by estimation, is 65 to 70%. The left ventricle has normal function. The left ventricle has no regional wall motion abnormalities. The left ventricular internal cavity size was normal in size. There is  no left ventricular hypertrophy. Left ventricular diastolic parameters are consistent with Grade I diastolic dysfunction (impaired relaxation). Right Ventricle: The right ventricular size is normal. No increase in right ventricular wall thickness. Right ventricular systolic function is normal. There is mildly elevated pulmonary artery systolic pressure. The tricuspid regurgitant velocity is 3.00  m/s, and with an assumed right atrial pressure of 8 mmHg, the estimated right ventricular systolic pressure is 123XX123 mmHg. Left Atrium: Left atrial size was moderately dilated. Right Atrium: Right atrial size was mildly dilated. Pericardium: There is no evidence of pericardial effusion. Mitral Valve: The mitral valve is normal in structure. No evidence of mitral valve regurgitation. No evidence of mitral valve stenosis. Tricuspid Valve: The tricuspid valve is normal in structure. Tricuspid valve regurgitation is moderate . No evidence of tricuspid stenosis. Aortic Valve: The aortic valve is tricuspid. There is moderate calcification of the aortic valve. There is moderate thickening of the aortic valve. Aortic valve regurgitation is moderate. Aortic  regurgitation PHT measures 318 msec. Aortic valve sclerosis  is present, with no evidence of aortic valve stenosis. Aortic valve mean gradient measures 11.0 mmHg. Aortic valve peak gradient measures 19.8 mmHg. Aortic valve area, by VTI measures 2.38 cm. Pulmonic Valve: The pulmonic valve was normal in structure. Pulmonic valve regurgitation is mild. No evidence of pulmonic stenosis. Aorta: The aortic root is normal in size and structure. Venous: The inferior vena cava is dilated in size with greater than 50% respiratory variability, suggesting right atrial pressure of 8 mmHg. IAS/Shunts: No atrial level shunt detected by color flow Doppler.  LEFT VENTRICLE PLAX 2D LVIDd:         4.50 cm   Diastology LVIDs:         3.00 cm   LV e' medial:    5.55 cm/s LV PW:         1.00 cm   LV E/e' medial:  17.8 LV IVS:        0.90 cm   LV e' lateral:   8.38 cm/s LVOT diam:     2.00 cm   LV E/e' lateral: 11.8 LV SV:         94 LV SV Index:   66 LVOT Area:     3.14 cm  RIGHT VENTRICLE             IVC RV Basal diam:  2.80 cm     IVC diam: 2.00 cm RV S prime:     14.50 cm/s TAPSE (M-mode): 2.9 cm LEFT ATRIUM             Index        RIGHT ATRIUM  Index LA diam:        3.20 cm 2.23 cm/m   RA Area:     15.90 cm LA Vol (A2C):   68.3 ml 47.63 ml/m  RA Volume:   35.90 ml  25.04 ml/m LA Vol (A4C):   50.3 ml 35.08 ml/m LA Biplane Vol: 62.8 ml 43.80 ml/m  AORTIC VALVE AV Area (Vmax):    2.15 cm AV Area (Vmean):   2.04 cm AV Area (VTI):     2.38 cm AV Vmax:           222.50 cm/s AV Vmean:          151.000 cm/s AV VTI:            0.395 m AV Peak Grad:      19.8 mmHg AV Mean Grad:      11.0 mmHg LVOT Vmax:         152.00 cm/s LVOT Vmean:        98.100 cm/s LVOT VTI:          0.299 m LVOT/AV VTI ratio: 0.76 AI PHT:            318 msec  AORTA Ao Asc diam: 3.20 cm MITRAL VALVE                TRICUSPID VALVE MV Area (PHT): 4.57 cm     TR Peak grad:   36.0 mmHg MV Decel Time: 166 msec     TR Vmax:        300.00 cm/s MV E  velocity: 99.00 cm/s MV A velocity: 119.00 cm/s  SHUNTS MV E/A ratio:  0.83         Systemic VTI:  0.30 m                             Systemic Diam: 2.00 cm Donato Schultz MD Electronically signed by Donato Schultz MD Signature Date/Time: 10/08/2021/12:07:00 PM    Final    MR ANGIO HEAD WO CONTRAST  Result Date: 10/08/2021 CLINICAL DATA:  Acute neurologic deficit EXAM: MRA HEAD WITHOUT CONTRAST TECHNIQUE: Angiographic images of the Circle of Willis were acquired using MRA technique without intravenous contrast. COMPARISON:  None Available. FINDINGS: POSTERIOR CIRCULATION: --Vertebral arteries: Normal --Inferior cerebellar arteries: Normal. --Basilar artery: Normal. --Superior cerebellar arteries: Normal. --Posterior cerebral arteries: Normal. ANTERIOR CIRCULATION: --Intracranial internal carotid arteries: Normal. --Anterior cerebral arteries (ACA): Normal. --Middle cerebral arteries (MCA): Normal. ANATOMIC VARIANTS: Both P comms are patent. IMPRESSION: Normal intracranial MRA. Electronically Signed   By: Deatra Robinson M.D.   On: 10/08/2021 02:58   MR BRAIN WO CONTRAST  Result Date: 10/07/2021 CLINICAL DATA:  Aphasia EXAM: MRI HEAD WITHOUT CONTRAST TECHNIQUE: Multiplanar, multiecho pulse sequences of the brain and surrounding structures were obtained without intravenous contrast. COMPARISON:  04/25/2017 FINDINGS: Brain: Small focus of acute ischemia in the right cerebellar hemisphere. Old left frontal infarct. Chronic hemo siderosis at the site of the left frontal remote infarct. There is multifocal hyperintense T2-weighted signal within the white matter. Generalized volume loss. The midline structures are normal. Vascular: Major flow voids are preserved. Skull and upper cervical spine: Normal calvarium and skull base. Visualized upper cervical spine and soft tissues are normal. Sinuses/Orbits:No paranasal sinus fluid levels or advanced mucosal thickening. No mastoid or middle ear effusion. Normal orbits. IMPRESSION:  1. Small focus of acute ischemia in the right cerebellar hemisphere. No hemorrhage or mass effect. 2. Old left frontal infarct  and findings of chronic ischemic microangiopathy. Electronically Signed   By: Deatra Robinson M.D.   On: 10/07/2021 23:32   CT HEAD WO CONTRAST ( )  Result Date: 10/07/2021 CLINICAL DATA:  Mental status change. EXAM: CT HEAD WITHOUT CONTRAST TECHNIQUE: Contiguous axial images were obtained from the base of the skull through the vertex without intravenous contrast. RADIATION DOSE REDUCTION: This exam was performed according to the departmental dose-optimization program which includes automated exposure control, adjustment of the mA and/or kV according to patient size and/or use of iterative reconstruction technique. COMPARISON:  None Available. FINDINGS: Brain: Area of hypoattenuation in the left frontoparietal region spends the cortex and the subcortical white matter no evidence of hydrocephalus or mass effect. Mild brain parenchymal volume loss and deep white matter microangiopathy for age. Vascular: No hyperdense vessel or unexpected calcification. Skull: Normal. Negative for fracture or focal lesion. Sinuses/Orbits: No acute finding. Other: None. IMPRESSION: 1. Area of hypoattenuation in the left frontoparietal region spends the cortex and the subcortical white matter, which may represent an acute or subacute infarct. Further evaluation with MRI of the brain may be considered, if found clinically appropriate. 2. Mild brain parenchymal volume loss and deep white matter microangiopathy for age. Electronically Signed   By: Ted Mcalpine M.D.   On: 10/07/2021 21:09   DG Chest Port 1 View  Result Date: 10/07/2021 CLINICAL DATA:  Altered mental status EXAM: PORTABLE CHEST 1 VIEW COMPARISON:  05/05/2021 FINDINGS: Heart is borderline in size. Aortic atherosclerosis. No confluent airspace opacities or effusions. No acute bony abnormality. IMPRESSION: No active disease. Electronically  Signed   By: Charlett Nose M.D.   On: 10/07/2021 21:00     PHYSICAL EXAM  Temp:  [97.7 F (36.5 C)-98.8 F (37.1 C)] 97.9 F (36.6 C) (08/09 1146) Pulse Rate:  [69-96] 74 (08/09 1146) Resp:  [16-20] 18 (08/09 1146) BP: (120-157)/(54-78) 138/56 (08/09 1146) SpO2:  [97 %-100 %] 98 % (08/09 1146)  General - Well nourished, well developed, in no apparent distress.  Ophthalmologic - fundi not visualized due to noncooperation.  Cardiovascular - Regular rhythm and rate.  Neuro - awake, alert, eyes open, orientated to people and place, not orientated to time or age. No aphasia, paucity of speech, hard of hearing but answer questions appropriately, following all simple commands. Able to name 2/3 and repeat simple sentences. No gaze palsy, tracking bilaterally, visual field full. No facial droop. Tongue midline. Bilateral UEs 4/5, no drift. Bilaterally LEs 4/5, no drift. Sensation symmetrical bilaterally, b/l FTN intact grossly, gait not tested.     ASSESSMENT/PLAN Ms. SHANTY GINTY is a 88 y.o. female with history of hypertension, hyperlipidemia, diabetes, dementia, OSA admitted for fever with shivering and tremors. No tPA given due to outside window.    Stroke: Right acute small subdural cortical infarct, embolic pattern, etiology unclear  CT no acute abnormality, old left frontal infarct MRI right small cerebellum cortical infarct, old left frontal infarct MRA unremarkable Carotid Doppler unremarkable EEG mild diffuse encephalopathy, no seizure 2D Echo EF 65 to 70% LE venous Doppler negative for DVT Recommend 30-day CardioNet monitoring to rule out A-fib as outpatient LDL 82 HgbA1c 5.7 Lovenox for VTE prophylaxis aspirin 81 mg daily prior to admission, now on aspirin 81 mg daily and clopidogrel 75 mg daily DAPT for 3 weeks and then plavix alone. Patient counseled to be compliant with her antithrombotic medications Ongoing aggressive stroke risk factor management Therapy  recommendations: HH PT/OT Disposition: Pending  Bacteremia  Spiking fever Leukocytosis  Temp 102.2-> afebrile WBC 17.2->20.3->11.4 Blood culture 8/6 positive for Pseudomonas Blood culture 8/7 NGTD On cefepime and vancomycin -> cefepime UA negative CXR negative ID consulted  Diabetes HgbA1c 5.7, goal < 7.0 Controlled CBG monitoring SSI DM education and close PCP follow up  Hypertension Stable Long term BP goal normotensive  Hyperlipidemia Home meds: None LDL 82, goal < 70 Now on pravastatin 20 No high intensity statin needed given advanced age and LDL near goal Continue statin at discharge  Other Stroke Risk Factors Advanced age Hx stroke/TIA on imaging Obstructive sleep apnea, on CPAP at home  Other Active Problems Dementia with home caregiver 24/7  Hospital day # 3    Marvel Plan, MD PhD Stroke Neurology 10/10/2021 2:19 PM    To contact Stroke Continuity provider, please refer to [email or web address]. After hours, contact General Neurology

## 2021-10-10 NOTE — Progress Notes (Signed)
Occupational Therapy Treatment Patient Details Name: Jessica Hawkins MRN: [id number] DOB: 1922-05-15 Today's Date: 10/10/2021   History of present illness Pt is a 88 y/o female who presents 10/07/2021 with chills and rigors. 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 PMH significant for R BBB, CKD, HTN, hypothyroidism, OA, osteoporosis, DM II, memory deficits, B IM nail, ORIF R hip 2002, L rotator cuff repair 1999, B THR 2017/ 2018.   OT comments  Pt progressing towards goals this session, completed standing ADLs at sink with min A. Pt able to complete transfers with mod A+2, pt with posterior lean, mildly impulsive, needing cues for safety to attain balance before progressing mobility. Pt presenting with impairments listed below, will follow acutely. Continue to recommend HHOT at d/c.   Recommendations for follow up therapy are one component of a multi-disciplinary discharge planning process, led by the attending physician.  Recommendations may be updated based on patient status, additional functional criteria and insurance authorization.    Follow Up Recommendations  Home health OT    Assistance Recommended at Discharge Frequent or constant Supervision/Assistance  Patient can return home with the following  Assistance with cooking/housework;Direct supervision/assist for medications management;Direct supervision/assist for financial management;Help with stairs or ramp for entrance;Assist for transportation;A lot of help with bathing/dressing/bathroom;A little help with walking and/or transfers   Equipment Recommendations  Other (comment);None recommended by OT (pt has all needed DME)    Recommendations for Other Services PT consult    Precautions / Restrictions Precautions Precautions: Fall Precaution Comments: Heavy posterior bias initially upon stand. Restrictions Weight Bearing Restrictions: No       Mobility Bed Mobility Overal bed mobility: Needs Assistance Bed Mobility:  Supine to Sit     Supine to sit: Mod assist     General bed mobility comments: Assist for transition fully to EOB. Increased time to scoot out and get feet fully on the floor.    Transfers Overall transfer level: Needs assistance Equipment used: Rolling walker (2 wheels) Transfers: Sit to/from Stand, Bed to chair/wheelchair/BSC Sit to Stand: Max assist, Mod assist, +2 physical assistance     Step pivot transfers: Mod assist, +2 physical assistance, +2 safety/equipment           Balance Overall balance assessment: Needs assistance Sitting-balance support: Feet supported, Bilateral upper extremity supported Sitting balance-Leahy Scale: Poor     Standing balance support: Bilateral upper extremity supported, During functional activity, Reliant on assistive device for balance Standing balance-Leahy Scale: Poor Standing balance comment: +2 assist required                           ADL either performed or assessed with clinical judgement   ADL Overall ADL's : Needs assistance/impaired     Grooming: Standing;Minimal assistance                   Toilet Transfer: Moderate assistance;+2 for physical assistance;BSC/3in1;Stand-pivot;Rolling walker (2 wheels) Toilet Transfer Details (indicate cue type and reason): completed x2         Functional mobility during ADLs: Moderate assistance;+2 for physical assistance      Extremity/Trunk Assessment Upper Extremity Assessment Upper Extremity Assessment: Generalized weakness   Lower Extremity Assessment Lower Extremity Assessment: Defer to PT evaluation        Vision   Vision Assessment?: No apparent visual deficits   Perception Perception Perception: Not tested   Praxis Praxis Praxis: Not tested    Cognition Arousal/Alertness:  Awake/alert Behavior During Therapy: Flat affect Overall Cognitive Status: History of cognitive impairments - at baseline Area of Impairment: Orientation, Attention,  Memory, Following commands, Safety/judgement, Awareness, Problem solving                 Orientation Level: Disoriented to, Time, Situation Current Attention Level: Focused Memory: Decreased short-term memory Following Commands: Follows one step commands inconsistently, Follows one step commands with increased time Safety/Judgement: Decreased awareness of deficits, Decreased awareness of safety Awareness: Intellectual Problem Solving: Slow processing, Decreased initiation, Difficulty sequencing, Requires verbal cues, Requires tactile cues General Comments: pt needing increased cuing for safety with tasks, can be impulsive, wanting to "walk" before getting standing balance; baseline hx of dementia        Exercises      Shoulder Instructions       General Comments VSS on RA, caregiver present throughout session, daughter present at start of session    Pertinent Vitals/ Pain       Pain Assessment Pain Assessment: No/denies pain  Home Living                                          Prior Functioning/Environment              Frequency  Min 2X/week        Progress Toward Goals  OT Goals(current goals can now be found in the care plan section)  Progress towards OT goals: Progressing toward goals  Acute Rehab OT Goals Patient Stated Goal: none stated OT Goal Formulation: With patient Time For Goal Achievement: 10/22/21 Potential to Achieve Goals: Good ADL Goals Pt Will Perform Upper Body Dressing: with caregiver independent in assisting;sitting;standing;with min assist Pt Will Perform Lower Body Dressing: with min assist;sit to/from stand;sitting/lateral leans;with caregiver independent in assisting Pt Will Transfer to Toilet: with min assist;ambulating;regular height toilet Additional ADL Goal #1: pt will complete bed mobility min A in prep for ADLs  Plan Discharge plan remains appropriate;Frequency remains appropriate     Co-evaluation    PT/OT/SLP Co-Evaluation/Treatment: Yes Reason for Co-Treatment: For patient/therapist safety;To address functional/ADL transfers PT goals addressed during session: Mobility/safety with mobility;Balance;Proper use of DME OT goals addressed during session: ADL's and self-care      AM-PAC OT "6 Clicks" Daily Activity     Outcome Measure   Help from another person eating meals?: A Little Help from another person taking care of personal grooming?: A Little Help from another person toileting, which includes using toliet, bedpan, or urinal?: A Little Help from another person bathing (including washing, rinsing, drying)?: A Lot Help from another person to put on and taking off regular upper body clothing?: A Little Help from another person to put on and taking off regular lower body clothing?: A Lot 6 Click Score: 16    End of Session Equipment Utilized During Treatment: Gait belt;Rolling walker (2 wheels)  OT Visit Diagnosis: Unsteadiness on feet (R26.81);Other abnormalities of gait and mobility (R26.89);Muscle weakness (generalized) (M62.81)   Activity Tolerance Patient tolerated treatment well   Patient Left Other (comment);with call bell/phone within reach;with family/visitor present (on [facility] with charegiver present, NT notified to assist pt back to chair when ready)   Nurse Communication Mobility status        Time: 0086-7619 OT Time Calculation (min): 45 min  Charges: OT General Charges $OT Visit: 1 Visit OT Treatments $Self Care/Home Management :  8-22 mins  Alfonzo Beers, [place], OTR/L Acute Rehab [phone]   Mayer Masker 10/10/2021, 12:13 PM

## 2021-10-10 NOTE — Care Management Important Message (Signed)
Important Message  Patient Details  Name: Jessica Hawkins MRN: [id number] Date of Birth: [birth year]   Medicare Important Message Given:  Yes     Dorena Bodo 10/10/2021, 12:33 PM

## 2021-10-11 DIAGNOSIS — L03116 Cellulitis of left lower limb: Secondary | ICD-10-CM | POA: Diagnosis not present

## 2021-10-11 DIAGNOSIS — F0390 Unspecified dementia without behavioral disturbance: Secondary | ICD-10-CM | POA: Diagnosis not present

## 2021-10-11 DIAGNOSIS — I639 Cerebral infarction, unspecified: Secondary | ICD-10-CM | POA: Diagnosis not present

## 2021-10-11 DIAGNOSIS — I10 Essential (primary) hypertension: Secondary | ICD-10-CM | POA: Diagnosis not present

## 2021-10-11 DIAGNOSIS — R7881 Bacteremia: Secondary | ICD-10-CM | POA: Diagnosis not present

## 2021-10-11 DIAGNOSIS — B965 Pseudomonas (aeruginosa) (mallei) (pseudomallei) as the cause of diseases classified elsewhere: Secondary | ICD-10-CM | POA: Diagnosis present

## 2021-10-11 LAB — CBC WITH DIFFERENTIAL/PLATELET
Abs Immature Granulocytes: 0.06 10*3/uL (ref 0.00–0.07)
Basophils Absolute: 0 10*3/uL (ref 0.0–0.1)
Basophils Relative: 0 %
Eosinophils Absolute: 0.1 10*3/uL (ref 0.0–0.5)
Eosinophils Relative: 2 %
HCT: 31.7 % — ABNORMAL LOW (ref 36.0–46.0)
Hemoglobin: 9.9 g/dL — ABNORMAL LOW (ref 12.0–15.0)
Immature Granulocytes: 1 %
Lymphocytes Relative: 17 %
Lymphs Abs: 1.1 10*3/uL (ref 0.7–4.0)
MCH: 27.2 pg (ref 26.0–34.0)
MCHC: 31.2 g/dL (ref 30.0–36.0)
MCV: 87.1 fL (ref 80.0–100.0)
Monocytes Absolute: 0.6 10*3/uL (ref 0.1–1.0)
Monocytes Relative: 10 %
Neutro Abs: 4.5 10*3/uL (ref 1.7–7.7)
Neutrophils Relative %: 70 %
Platelets: 186 10*3/uL (ref 150–400)
RBC: 3.64 MIL/uL — ABNORMAL LOW (ref 3.87–5.11)
RDW: 14.5 % (ref 11.5–15.5)
WBC: 6.5 10*3/uL (ref 4.0–10.5)
nRBC: 0 % (ref 0.0–0.2)

## 2021-10-11 LAB — COMPREHENSIVE METABOLIC PANEL
ALT: 18 U/L (ref 0–44)
AST: 29 U/L (ref 15–41)
Albumin: 2.5 g/dL — ABNORMAL LOW (ref 3.5–5.0)
Alkaline Phosphatase: 51 U/L (ref 38–126)
Anion gap: 7 (ref 5–15)
BUN: 45 mg/dL — ABNORMAL HIGH (ref 8–23)
CO2: 22 mmol/L (ref 22–32)
Calcium: 8.9 mg/dL (ref 8.9–10.3)
Chloride: 107 mmol/L (ref 98–111)
Creatinine, Ser: 1.26 mg/dL — ABNORMAL HIGH (ref 0.44–1.00)
GFR, Estimated: 39 mL/min — ABNORMAL LOW (ref >60)
Glucose, Bld: 106 mg/dL — ABNORMAL HIGH (ref 70–99)
Potassium: 4.2 mmol/L (ref 3.5–5.1)
Sodium: 136 mmol/L (ref 135–145)
Total Bilirubin: 0.3 mg/dL (ref 0.3–1.2)
Total Protein: 5.9 g/dL — ABNORMAL LOW (ref 6.5–8.1)

## 2021-10-11 LAB — MAGNESIUM: Magnesium: 1.8 mg/dL (ref 1.7–2.4)

## 2021-10-11 LAB — URINE CULTURE: Culture: NO GROWTH

## 2021-10-11 LAB — PHOSPHORUS: Phosphorus: 3.8 mg/dL (ref 2.5–4.6)

## 2021-10-11 MED ORDER — GABAPENTIN 100 MG PO CAPS: 100.0000 mg | ORAL_CAPSULE | Freq: Every day | ORAL | 0 refills | Status: AC

## 2021-10-11 MED ORDER — ASPIRIN 81 MG PO CHEW: 81.0000 mg | CHEWABLE_TABLET | Freq: Every day | ORAL | 0 refills | Status: AC

## 2021-10-11 MED ORDER — LEVOFLOXACIN 500 MG PO TABS: 500.0000 mg | ORAL_TABLET | ORAL | 0 refills | Status: AC

## 2021-10-11 MED ORDER — LEVOFLOXACIN 500 MG PO TABS: 500.0000 mg | ORAL_TABLET | ORAL | Status: DC

## 2021-10-11 MED ORDER — LEVOFLOXACIN 750 MG PO TABS
750.0000 mg | ORAL_TABLET | Freq: Once | ORAL | Status: AC
  Administered 2021-10-11: 750 mg via ORAL
  Filled 2021-10-11: qty 1

## 2021-10-11 MED ORDER — CLOPIDOGREL BISULFATE 75 MG PO TABS: 75.0000 mg | ORAL_TABLET | Freq: Every day | ORAL | 2 refills | Status: AC

## 2021-10-11 NOTE — Progress Notes (Signed)
Occupational Therapy Treatment Patient Details Name: Jessica Hawkins MRN: [id number] DOB: [birth year] Today's Date: 10/11/2021   History of present illness Pt is a 88 y/o female who presents 10/07/2021 with chills and rigors. 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 PMH significant for R BBB, CKD, HTN, hypothyroidism, OA, osteoporosis, DM II, memory deficits, B IM nail, ORIF R hip 2002, L rotator cuff repair 1999, B THR 2017/ 2018.   OT comments  Pt progressing towards established OT goals. Pt performing UB bathing/dressing with min guard-min A, and LB bathing/dressing with max A. Pt caregiver present and participative. Pt performing transfers with mod-max A+2, and functional mobility with min guard-min A after initial assist for upright position. Following commands with min cues, but requiring up to max cues for problem solving. Pt continues to present with generalized weakness, decreased balance, awareness of body in space, and cognition. Pt discharging home with caregiver following session. Pt left with caregiver and nurse tech. Continue to recommend HHOT.   Recommendations for follow up therapy are one component of a multi-disciplinary discharge planning process, led by the attending physician.  Recommendations may be updated based on patient status, additional functional criteria and insurance authorization.    Follow Up Recommendations  Home health OT    Assistance Recommended at Discharge Frequent or constant Supervision/Assistance  Patient can return home with the following  Assistance with cooking/housework;Direct supervision/assist for medications management;Direct supervision/assist for financial management;Help with stairs or ramp for entrance;Assist for transportation;A lot of help with bathing/dressing/bathroom;A little help with walking and/or transfers   Equipment Recommendations  None recommended by OT    Recommendations for Other Services      Precautions / Restrictions  Precautions Precautions: Fall Precaution Comments: Heavy posterior bias initially upon stand. Restrictions Weight Bearing Restrictions: No       Mobility Bed Mobility                    Transfers     Transfers: Sit to/from Stand, Bed to chair/wheelchair/BSC     Squat pivot transfers: +2 physical assistance, Min assist       General transfer comment: Max verbal/tactile cues for squat pivot; min physical A     Balance Overall balance assessment: Needs assistance Sitting-balance support: Feet supported, Bilateral upper extremity supported Sitting balance-Leahy Scale: Poor Sitting balance - Comments: BUE bracing on bed and up to mod A without BUE support   Standing balance support: Bilateral upper extremity supported, During functional activity, Reliant on assistive device for balance Standing balance-Leahy Scale: Poor Standing balance comment: +2 assist required                           ADL either performed or assessed with clinical judgement   ADL Overall ADL's : Needs assistance/impaired         Upper Body Bathing: Min guard;Sitting   Lower Body Bathing: Maximal assistance;Sit to/from stand Lower Body Bathing Details (indicate cue type and reason): Max for anterior and posterior pericare Upper Body Dressing : Minimal assistance Upper Body Dressing Details (indicate cue type and reason): Caregiver independent assisting Lower Body Dressing: Maximal assistance Lower Body Dressing Details (indicate cue type and reason): Pt performing sit<>stand and requiring mod A for balance. Caregiver donning skirt Toilet Transfer: Moderate assistance;+2 for physical assistance;BSC/3in1;Stand-pivot;Rolling walker (2 wheels) Toilet Transfer Details (indicate cue type and reason): mod-max from [facility] Toileting- Clothing Manipulation and Hygiene: Total assistance Toileting - Clothing Manipulation  Details (indicate cue type and reason): RN wiping on entry      Functional mobility during ADLs: Minimal assistance;+2 for physical assistance General ADL Comments: Pt with strong posterior bias first several sit<>stands, max cues to achieve upright position    Extremity/Trunk Assessment Upper Extremity Assessment Upper Extremity Assessment: Generalized weakness   Lower Extremity Assessment Lower Extremity Assessment: Defer to PT evaluation        Vision   Vision Assessment?: No apparent visual deficits Additional Comments: Occasional cues to scan to R side   Perception Perception Perception: Not tested   Praxis Praxis Praxis: Not tested    Cognition       Area of Impairment: Orientation, Attention, Memory, Following commands, Safety/judgement, Awareness, Problem solving                 Orientation Level: Disoriented to, Time, Situation Current Attention Level: Focused Memory: Decreased short-term memory Following Commands: Follows one step commands inconsistently, Follows one step commands with increased time Safety/Judgement: Decreased awareness of deficits, Decreased awareness of safety Awareness: Intellectual Problem Solving: Slow processing, Decreased initiation, Difficulty sequencing, Requires verbal cues, Requires tactile cues General Comments: h/o dementia, has caregivers at baseline. Very short term memory. Pt pleasantly confused. Following commands with incresed time        Exercises      Shoulder Instructions       General Comments VSS on RA. Caregiver present and assisting throughout session    Pertinent Vitals/ Pain       Pain Assessment Pain Assessment: Faces Faces Pain Scale: Hurts a little bit Pain Location: L hip discomfort with stance in L during ambulation Pain Descriptors / Indicators: Guarding Pain Intervention(s): Monitored during session  Home Living                                          Prior Functioning/Environment              Frequency  Min 2X/week         Progress Toward Goals  OT Goals(current goals can now be found in the care plan section)  Progress towards OT goals: Progressing toward goals  Acute Rehab OT Goals Patient Stated Goal: Go home OT Goal Formulation: With patient Time For Goal Achievement: 10/22/21 Potential to Achieve Goals: Good ADL Goals Pt Will Perform Upper Body Dressing: with caregiver independent in assisting;sitting;standing;with min assist Pt Will Perform Lower Body Dressing: with min assist;sit to/from stand;sitting/lateral leans;with caregiver independent in assisting Pt Will Transfer to Toilet: with min assist;ambulating;regular height toilet Additional ADL Goal #1: pt will complete bed mobility min A in prep for ADLs  Plan Discharge plan remains appropriate;Frequency remains appropriate    Co-evaluation    PT/OT/SLP Co-Evaluation/Treatment: Yes Reason for Co-Treatment: For patient/therapist safety;To address functional/ADL transfers PT goals addressed during session: Mobility/safety with mobility OT goals addressed during session: ADL's and self-care      AM-PAC OT "6 Clicks" Daily Activity     Outcome Measure   Help from another person eating meals?: A Little Help from another person taking care of personal grooming?: A Little Help from another person toileting, which includes using toliet, bedpan, or urinal?: A Lot Help from another person bathing (including washing, rinsing, drying)?: A Lot Help from another person to put on and taking off regular upper body clothing?: A Little Help from another person to put on and taking  off regular lower body clothing?: A Lot 6 Click Score: 15    End of Session Equipment Utilized During Treatment: Gait belt;Rolling walker (2 wheels)  OT Visit Diagnosis: Unsteadiness on feet (R26.81);Other abnormalities of gait and mobility (R26.89);Muscle weakness (generalized) (M62.81)   Activity Tolerance Patient tolerated treatment well   Patient Left Other  (comment) (Nurse tech taking to car with caregiver for discharge)   Nurse Communication Mobility status        Time: 5631-4970 OT Time Calculation (min): 45 min  Charges: OT General Charges $OT Visit: 1 Visit OT Treatments $Self Care/Home Management : 23-37 mins  Ladene Artist, OTR/L [facility] Acute Rehabilitation Office: [phone]   Drue Novel 10/11/2021, 11:39 AM

## 2021-10-11 NOTE — Progress Notes (Signed)
STROKE TEAM PROGRESS NOTE   SUBJECTIVE (INTERVAL HISTORY) Pt was seen while being discharged. Her caregiver and RN were with her. She is sitting in wheelchair, awake alert and neuro unchanged. Excited to go home. ID consulted yesterday and not concern about endocarditis. Recommended Abx course for 7 days.    OBJECTIVE Temp:  [97.6 F (36.4 C)-98.4 F (36.9 C)] 97.6 F (36.4 C) (08/10 0806) Pulse Rate:  [61-84] 61 (08/10 0806) Cardiac Rhythm: Normal sinus rhythm;Bundle branch block (08/10 0730) Resp:  [14-20] 14 (08/10 0806) BP: (117-136)/(47-72) 117/47 (08/10 0806) SpO2:  [97 %-100 %] 99 % (08/10 0806)  No results for input(s): "GLUCAP" in the last 168 hours. Recent Labs  Lab 10/07/21 2040 10/08/21 0545 10/09/21 0539 10/11/21 0327  NA 136 137 137 136  K 4.4 4.3 3.7 4.2  CL 101 105 105 107  CO2 26 27 24 22   GLUCOSE 149* 130* 112* 106*  BUN 53* 45* 34* 45*  CREATININE 1.25* 1.08* 1.00 1.26*  CALCIUM 10.2 9.1 9.1 8.9  MG  --   --  1.9 1.8  PHOS  --   --  2.7 3.8   Recent Labs  Lab 10/07/21 2040 10/08/21 0545 10/11/21 0327  AST 33 26 29  ALT 20 17 18   ALKPHOS 73 53 51  BILITOT 0.8 1.0 0.3  PROT 7.1 6.2* 5.9*  ALBUMIN 3.8 2.9* 2.5*   Recent Labs  Lab 10/07/21 2040 10/08/21 0545 10/09/21 0539 10/11/21 0327  WBC 17.2* 20.3* 11.4* 6.5  NEUTROABS 15.9*  --  9.7* 4.5  HGB 11.8* 10.4* 9.8* 9.9*  HCT 36.5 33.4* 30.2* 31.7*  MCV 85.5 86.3 84.1 87.1  PLT 219 189 171 186   No results for input(s): "CKTOTAL", "CKMB", "CKMBINDEX", "TROPONINI" in the last 168 hours. No results for input(s): "LABPROT", "INR" in the last 72 hours. No results for input(s): "COLORURINE", "LABSPEC", "PHURINE", "GLUCOSEU", "HGBUR", "BILIRUBINUR", "KETONESUR", "PROTEINUR", "UROBILINOGEN", "NITRITE", "LEUKOCYTESUR" in the last 72 hours.  Invalid input(s): "APPERANCEUR"      Component Value Date/Time   CHOL 154 10/08/2021 0545   CHOL 143 05/05/2014 1037   TRIG 26 10/08/2021 0545   HDL 67  10/08/2021 0545   HDL 72 05/05/2014 1037   CHOLHDL 2.3 10/08/2021 0545   VLDL 5 10/08/2021 0545   LDLCALC 82 10/08/2021 0545   LDLCALC 65 05/05/2014 1037   Lab Results  Component Value Date   HGBA1C 5.7 (H) 10/08/2021      Component Value Date/Time   LABOPIA NONE DETECTED 06/29/2011 1441   COCAINSCRNUR NONE DETECTED 06/29/2011 1441   LABBENZ NONE DETECTED 06/29/2011 1441   AMPHETMU NONE DETECTED 06/29/2011 1441   THCU NONE DETECTED 06/29/2011 1441   LABBARB NONE DETECTED 06/29/2011 1441    No results for input(s): "ETH" in the last 168 hours.  I have personally reviewed the radiological images below and agree with the radiology interpretations.  VAS US CAROTID  Result Date: 10/09/2021 Carotid Arterial Duplex Study Patient Name:  Jessica Hawkins  Date of Exam:   10/08/2021 Medical Rec #: [id number]        Accession #:    [id number] Date of Birth: 86-Jun-1924        Patient Gender: F Patient Age:   88 years Exam Location:  [facility] Procedure:      VAS US CAROTID Referring Phys: Gean Birchwood --------------------------------------------------------------------------------  Indications:      CVA and Speech disturbance. Risk Factors:     Hypertension, Diabetes. Other Factors:  Dementia, CKD. Limitations       Today's exam was limited due to altered mental status,                   tortuosity. Comparison Study: No prior study on file Performing Technologist: Sharion Dove RVS  Examination Guidelines: A complete evaluation includes B-mode imaging, spectral Doppler, color Doppler, and power Doppler as needed of all accessible portions of each vessel. Bilateral testing is considered an integral part of a complete examination. Limited examinations for reoccurring indications may be performed as noted.  Right Carotid Findings: +----------+--------+--------+--------+------------------+------------------+           PSV cm/sEDV cm/sStenosisPlaque DescriptionComments            +----------+--------+--------+--------+------------------+------------------+ CCA Prox  24      3                                 intimal thickening +----------+--------+--------+--------+------------------+------------------+ CCA Distal40      6                                 intimal thickening +----------+--------+--------+--------+------------------+------------------+ ICA Prox  53      13              heterogenous                         +----------+--------+--------+--------+------------------+------------------+ ICA Mid   71      8                                 tortuous           +----------+--------+--------+--------+------------------+------------------+ ICA Distal70      12                                tortuous           +----------+--------+--------+--------+------------------+------------------+ ECA       63      9                                                    +----------+--------+--------+--------+------------------+------------------+ +----------+--------+-------+--------+-------------------+           PSV cm/sEDV cmsDescribeArm Pressure (mmHG) +----------+--------+-------+--------+-------------------+ Subclavian50                                         +----------+--------+-------+--------+-------------------+ +---------+--------+--+--------+-+ VertebralPSV cm/s41EDV cm/s1 +---------+--------+--+--------+-+  Left Carotid Findings: +----------+--------+--------+--------+------------------+------------------+           PSV cm/sEDV cm/sStenosisPlaque DescriptionComments           +----------+--------+--------+--------+------------------+------------------+ CCA Prox  50      4                                 intimal thickening +----------+--------+--------+--------+------------------+------------------+ CCA Distal50      12  intimal thickening  +----------+--------+--------+--------+------------------+------------------+ ICA Prox  44      8               heterogenous      tortuous           +----------+--------+--------+--------+------------------+------------------+ ICA Mid   42      7                                 tortuous           +----------+--------+--------+--------+------------------+------------------+ ICA Distal44      5                                 tortuous           +----------+--------+--------+--------+------------------+------------------+ ECA       65      4                                                    +----------+--------+--------+--------+------------------+------------------+ +----------+--------+--------+--------+-------------------+           PSV cm/sEDV cm/sDescribeArm Pressure (mmHG) +----------+--------+--------+--------+-------------------+ [id number]                                          +----------+--------+--------+--------+-------------------+ +---------+--------+--+--------+-+ VertebralPSV cm/s48EDV cm/s4 +---------+--------+--+--------+-+   Summary: Right Carotid: The extracranial vessels were near-normal with only minimal wall                thickening or plaque. Left Carotid: The extracranial vessels were near-normal with only minimal wall               thickening or plaque. Vertebrals:  Bilateral vertebral arteries demonstrate antegrade flow. Subclavians: Normal flow hemodynamics were seen in bilateral subclavian              arteries. *See table(s) above for measurements and observations.  Electronically signed by Antony Contras MD on 10/09/2021 at 8:18:28 AM.    Final    EEG adult  Result Date: 10/08/2021 Lora Havens, MD     10/08/2021  4:50 PM Patient Name: Jessica Hawkins MRN: [id number] Epilepsy Attending: Lora Havens Referring Physician/Provider: Rosalin Hawking, MD Date: 10/08/2021 Duration: 22.12 mins Patient history:  88yo F with acute small subdural  cortical infarct. EEG to evaluate for seizure Level of alertness: Awake AEDs during EEG study: None Technical aspects: This EEG study was done with scalp electrodes positioned according to the 10-20 International system of electrode placement. Electrical activity was reviewed with band pass filter of 1-70Hz , sensitivity of 7 uV/mm, display speed of 93mm/sec with a 60Hz  notched filter applied as appropriate. EEG data were recorded continuously and digitally stored.  Video monitoring was available and reviewed as appropriate. Description: The posterior dominant rhythm consists of 8 Hz activity of moderate voltage (25-35 uV) seen predominantly in posterior head regions, symmetric and reactive to eye opening and eye closing. EEG showed intermittent generalized 3 to 6 Hz theta-delta slowing.  Hyperventilation and photic stimulation were not performed.   ABNORMALITY - Intermittent slow, generalized IMPRESSION: This study is suggestive of mild diffuse encephalopathy, nonspecific etiology. No seizures or epileptiform  discharges were seen throughout the recording. Priyanka O Yadav   VAS [place] LOWER EXTREMITY VENOUS (DVT)  Result Date: 10/08/2021  Lower Venous DVT Study Patient Name:  KATALIYA JANG  Date of Exam:   10/08/2021 Medical Rec #: [id number]        Accession #:    [id number] Date of Birth: Jul 19, 1922        Patient Gender: F Patient Age:   11 years Exam Location:  [facility] Procedure:      VAS [place] LOWER EXTREMITY VENOUS (DVT) Referring Phys: Cornelius Moras Brycelyn Gambino --------------------------------------------------------------------------------  Indications: Stroke.  Limitations: Altered mental status, unable to position patient's legs adequately and body habitus. Comparison Study: Prior negative left LEV done 03/23/18 Performing Technologist: Sharion Dove RVS  Examination Guidelines: A complete evaluation includes B-mode imaging, spectral Doppler, color Doppler, and power Doppler as needed of all accessible portions of  each vessel. Bilateral testing is considered an integral part of a complete examination. Limited examinations for reoccurring indications may be performed as noted. The reflux portion of the exam is performed with the patient in reverse Trendelenburg.  +---------+---------------+---------+-----------+----------+-------------------+ RIGHT    CompressibilityPhasicitySpontaneityPropertiesThrombus Aging      +---------+---------------+---------+-----------+----------+-------------------+ CFV      Full           Yes      Yes                                      +---------+---------------+---------+-----------+----------+-------------------+ SFJ      Full                                                             +---------+---------------+---------+-----------+----------+-------------------+ FV Prox  Full                                                             +---------+---------------+---------+-----------+----------+-------------------+ FV Mid   Full                                                             +---------+---------------+---------+-----------+----------+-------------------+ FV DistalFull                                                             +---------+---------------+---------+-----------+----------+-------------------+ PFV      Full                                                             +---------+---------------+---------+-----------+----------+-------------------+ POP  Yes      Yes                  patent by color and                                                       Doppler             +---------+---------------+---------+-----------+----------+-------------------+ PTV                                                   Not well visualized +---------+---------------+---------+-----------+----------+-------------------+ PERO                                                  Not well  visualized +---------+---------------+---------+-----------+----------+-------------------+   +---------+---------------+---------+-----------+----------+-------------------+ LEFT     CompressibilityPhasicitySpontaneityPropertiesThrombus Aging      +---------+---------------+---------+-----------+----------+-------------------+ CFV      Full                                                             +---------+---------------+---------+-----------+----------+-------------------+ SFJ      Full                                                             +---------+---------------+---------+-----------+----------+-------------------+ FV Prox  Full           Yes      Yes                                      +---------+---------------+---------+-----------+----------+-------------------+ FV Mid   Full                                                             +---------+---------------+---------+-----------+----------+-------------------+ FV DistalFull                                                             +---------+---------------+---------+-----------+----------+-------------------+ PFV      Full           Yes      Yes                                      +---------+---------------+---------+-----------+----------+-------------------+  POP                                                   patent by color and                                                       Doppler             +---------+---------------+---------+-----------+----------+-------------------+ PTV                                                   Not well visualized +---------+---------------+---------+-----------+----------+-------------------+ PERO                                                  Not well visualized +---------+---------------+---------+-----------+----------+-------------------+     Summary: RIGHT: - There is no evidence of deep vein thrombosis in  the lower extremity. However, portions of this examination were limited- see technologist comments above.  LEFT: - There is no evidence of deep vein thrombosis in the lower extremity. However, portions of this examination were limited- see technologist comments above.  *See table(s) above for measurements and observations. Electronically signed by Servando Snare MD on 10/08/2021 at 1:35:46 PM.    Final    ECHOCARDIOGRAM COMPLETE  Result Date: 10/08/2021    ECHOCARDIOGRAM REPORT   Patient Name:   Jessica Hawkins Date of Exam: 10/08/2021 Medical Rec #:  [id number]       Height:       56.3 in Accession #:    [id number]      Weight:       120.0 lb Date of Birth:  06-06-1922       BSA:          1.434 m Patient Age:    9 years        BP:           133/68 mmHg Patient Gender: F               HR:           75 bpm. Exam Location:  Inpatient Procedure: 2D Echo Indications:    stroke  History:        Patient has no prior history of Echocardiogram examinations.                 Chronic kidney disease, Signs/Symptoms:Fever; Risk                 Factors:Hypertension, Dyslipidemia and Diabetes.  Sonographer:    Johny Chess RDCS Sonographer#2:  Greer Pickerel Referring Phys: [place]  1. Left ventricular ejection fraction, by estimation, is 65 to 70%. The left ventricle has normal function. The left ventricle has no regional wall motion abnormalities. Left ventricular diastolic parameters are consistent with Grade I diastolic dysfunction (impaired relaxation).  2. Right ventricular systolic function is normal. The right ventricular size  is normal. There is mildly elevated pulmonary artery systolic pressure. The estimated right ventricular systolic pressure is 44.0 mmHg.  3. Left atrial size was moderately dilated.  4. Right atrial size was mildly dilated.  5. The mitral valve is normal in structure. No evidence of mitral valve regurgitation. No evidence of mitral stenosis.  6. Tricuspid valve  regurgitation is moderate.  7. The aortic valve is tricuspid. There is moderate calcification of the aortic valve. There is moderate thickening of the aortic valve. Aortic valve regurgitation is moderate. Aortic valve sclerosis is present, with no evidence of aortic valve stenosis.  8. The inferior vena cava is dilated in size with >50% respiratory variability, suggesting right atrial pressure of 8 mmHg. Conclusion(s)/Recommendation(s): No intracardiac source of embolism detected on this transthoracic study. Consider a transesophageal echocardiogram to exclude cardiac source of embolism if clinically indicated. FINDINGS  Left Ventricle: Left ventricular ejection fraction, by estimation, is 65 to 70%. The left ventricle has normal function. The left ventricle has no regional wall motion abnormalities. The left ventricular internal cavity size was normal in size. There is  no left ventricular hypertrophy. Left ventricular diastolic parameters are consistent with Grade I diastolic dysfunction (impaired relaxation). Right Ventricle: The right ventricular size is normal. No increase in right ventricular wall thickness. Right ventricular systolic function is normal. There is mildly elevated pulmonary artery systolic pressure. The tricuspid regurgitant velocity is 3.00  m/s, and with an assumed right atrial pressure of 8 mmHg, the estimated right ventricular systolic pressure is 44.0 mmHg. Left Atrium: Left atrial size was moderately dilated. Right Atrium: Right atrial size was mildly dilated. Pericardium: There is no evidence of pericardial effusion. Mitral Valve: The mitral valve is normal in structure. No evidence of mitral valve regurgitation. No evidence of mitral valve stenosis. Tricuspid Valve: The tricuspid valve is normal in structure. Tricuspid valve regurgitation is moderate . No evidence of tricuspid stenosis. Aortic Valve: The aortic valve is tricuspid. There is moderate calcification of the aortic valve.  There is moderate thickening of the aortic valve. Aortic valve regurgitation is moderate. Aortic regurgitation PHT measures 318 msec. Aortic valve sclerosis  is present, with no evidence of aortic valve stenosis. Aortic valve mean gradient measures 11.0 mmHg. Aortic valve peak gradient measures 19.8 mmHg. Aortic valve area, by VTI measures 2.38 cm. Pulmonic Valve: The pulmonic valve was normal in structure. Pulmonic valve regurgitation is mild. No evidence of pulmonic stenosis. Aorta: The aortic root is normal in size and structure. Venous: The inferior vena cava is dilated in size with greater than 50% respiratory variability, suggesting right atrial pressure of 8 mmHg. IAS/Shunts: No atrial level shunt detected by color flow Doppler.  LEFT VENTRICLE PLAX 2D LVIDd:         4.50 cm   Diastology LVIDs:         3.00 cm   LV e' medial:    5.55 cm/s LV PW:         1.00 cm   LV E/e' medial:  17.8 LV IVS:        0.90 cm   LV e' lateral:   8.38 cm/s LVOT diam:     2.00 cm   LV E/e' lateral: 11.8 LV SV:         94 LV SV Index:   66 LVOT Area:     3.14 cm  RIGHT VENTRICLE             IVC RV Basal diam:  2.80 cm  IVC diam: 2.00 cm RV S prime:     14.50 cm/s TAPSE (M-mode): 2.9 cm LEFT ATRIUM             Index        RIGHT ATRIUM           Index LA diam:        3.20 cm 2.23 cm/m   RA Area:     15.90 cm LA Vol (A2C):   68.3 ml 47.63 ml/m  RA Volume:   35.90 ml  25.04 ml/m LA Vol (A4C):   50.3 ml 35.08 ml/m LA Biplane Vol: 62.8 ml 43.80 ml/m  AORTIC VALVE AV Area (Vmax):    2.15 cm AV Area (Vmean):   2.04 cm AV Area (VTI):     2.38 cm AV Vmax:           222.50 cm/s AV Vmean:          151.000 cm/s AV VTI:            0.395 m AV Peak Grad:      19.8 mmHg AV Mean Grad:      11.0 mmHg LVOT Vmax:         152.00 cm/s LVOT Vmean:        98.100 cm/s LVOT VTI:          0.299 m LVOT/AV VTI ratio: 0.76 AI PHT:            318 msec  AORTA Ao Asc diam: 3.20 cm MITRAL VALVE                TRICUSPID VALVE MV Area (PHT): 4.57 cm      TR Peak grad:   36.0 mmHg MV Decel Time: 166 msec     TR Vmax:        300.00 cm/s MV E velocity: 99.00 cm/s MV A velocity: 119.00 cm/s  SHUNTS MV E/A ratio:  0.83         Systemic VTI:  0.30 m                             Systemic Diam: 2.00 cm Candee Furbish MD Electronically signed by Candee Furbish MD Signature Date/Time: 10/08/2021/12:07:00 PM    Final    MR ANGIO HEAD WO CONTRAST  Result Date: 10/08/2021 CLINICAL DATA:  Acute neurologic deficit EXAM: MRA HEAD WITHOUT CONTRAST TECHNIQUE: Angiographic images of the Circle of Willis were acquired using MRA technique without intravenous contrast. COMPARISON:  None Available. FINDINGS: POSTERIOR CIRCULATION: --Vertebral arteries: Normal --Inferior cerebellar arteries: Normal. --Basilar artery: Normal. --Superior cerebellar arteries: Normal. --Posterior cerebral arteries: Normal. ANTERIOR CIRCULATION: --Intracranial internal carotid arteries: Normal. --Anterior cerebral arteries (ACA): Normal. --Middle cerebral arteries (MCA): Normal. ANATOMIC VARIANTS: Both P comms are patent. IMPRESSION: Normal intracranial MRA. Electronically Signed   By: Ulyses Jarred M.D.   On: 10/08/2021 02:58   MR BRAIN WO CONTRAST  Result Date: 10/07/2021 CLINICAL DATA:  Aphasia EXAM: MRI HEAD WITHOUT CONTRAST TECHNIQUE: Multiplanar, multiecho pulse sequences of the brain and surrounding structures were obtained without intravenous contrast. COMPARISON:  04/25/2017 FINDINGS: Brain: Small focus of acute ischemia in the right cerebellar hemisphere. Old left frontal infarct. Chronic hemo siderosis at the site of the left frontal remote infarct. There is multifocal hyperintense T2-weighted signal within the white matter. Generalized volume loss. The midline structures are normal. Vascular: Major flow voids are preserved. Skull and upper cervical spine: Normal calvarium and  skull base. Visualized upper cervical spine and soft tissues are normal. Sinuses/Orbits:No paranasal sinus fluid levels or  advanced mucosal thickening. No mastoid or middle ear effusion. Normal orbits. IMPRESSION: 1. Small focus of acute ischemia in the right cerebellar hemisphere. No hemorrhage or mass effect. 2. 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 Electronically Signed   By: Ulyses Jarred M.D.   On: 10/07/2021 23:32   CT HEAD WO CONTRAST (5MM)  Result Date: 10/07/2021 CLINICAL DATA:  Mental status change. EXAM: CT HEAD WITHOUT CONTRAST TECHNIQUE: Contiguous axial images were obtained from the base of the skull through the vertex without intravenous contrast. RADIATION DOSE REDUCTION: This exam was performed according to the departmental dose-optimization program which includes automated exposure control, adjustment of the mA and/or kV according to patient size and/or use of iterative reconstruction technique. COMPARISON:  None Available. FINDINGS: Brain: Area of hypoattenuation in the left frontoparietal region spends the cortex and the subcortical white matter no evidence of hydrocephalus or mass effect. Mild brain parenchymal volume loss and deep white matter microangiopathy for age. Vascular: No hyperdense vessel or unexpected calcification. Skull: Normal. Negative for fracture or focal lesion. Sinuses/Orbits: No acute finding. Other: None. IMPRESSION: 1. Area of hypoattenuation in the left frontoparietal region spends the cortex and the subcortical white matter, which may represent an acute or subacute infarct. Further evaluation with MRI of the brain may be considered, if found clinically appropriate. 2. Mild brain parenchymal volume loss and deep white matter microangiopathy for age. Electronically Signed   By: Fidela Salisbury M.D.   On: 10/07/2021 21:09   DG Chest Port 1 View  Result Date: 10/07/2021 CLINICAL DATA:  Altered mental status EXAM: PORTABLE CHEST 1 VIEW COMPARISON:  05/05/2021 FINDINGS: Heart is borderline in size. Aortic atherosclerosis. No confluent airspace  opacities or effusions. No acute bony abnormality. IMPRESSION: No active disease. Electronically Signed   By: Rolm Baptise M.D.   On: 10/07/2021 21:00     PHYSICAL EXAM  Temp:  [97.6 F (36.4 C)-98.4 F (36.9 C)] 97.6 F (36.4 C) (08/10 0806) Pulse Rate:  [61-84] 61 (08/10 0806) Resp:  [14-20] 14 (08/10 0806) BP: (117-136)/(47-72) 117/47 (08/10 0806) SpO2:  [97 %-100 %] 99 % (08/10 0806)  General - Well nourished, well developed, in no apparent distress.  Ophthalmologic - fundi not visualized due to noncooperation.  Cardiovascular - Regular rhythm and rate.  Neuro - awake, alert, eyes open, orientated to people and place, not orientated to time or age. No aphasia, paucity of speech, hard of hearing but answer questions appropriately, following all simple commands. Able to name 2/3 and repeat simple sentences. No gaze palsy, tracking bilaterally, visual field full. No facial droop. Tongue midline. Bilateral UEs 4/5, no drift. Bilaterally LEs 4/5, no drift. Sensation symmetrical bilaterally, b/l FTN intact grossly, gait not tested.     ASSESSMENT/PLAN Ms. CERIYAH NEARING is a 88 y.o. female with history of hypertension, hyperlipidemia, diabetes, dementia, OSA admitted for fever with shivering and tremors. No tPA given due to outside window.    Stroke: Right acute small subdural cortical infarct, embolic pattern, etiology unclear  CT no acute abnormality, old left frontal infarct MRI right small cerebellum cortical infarct, old left frontal infarct MRA unremarkable Carotid Doppler unremarkable EEG mild diffuse encephalopathy, no seizure 2D Echo EF 65 to 70% LE venous Doppler negative for DVT Recommend 30-day Cardiac event monitoring to rule out A-fib as outpatient LDL 82 HgbA1c 5.7 Lovenox for VTE prophylaxis aspirin 81  mg daily prior to admission, now on aspirin 81 mg daily and clopidogrel 75 mg daily DAPT for 3 weeks and then plavix alone. Patient counseled to be compliant with  her antithrombotic medications Ongoing aggressive stroke risk factor management Therapy recommendations: HH PT/OT Disposition: Pending  Bacteremia  Spiking fever Leukocytosis Temp 102.2-> afebrile WBC 17.2->20.3->11.4 Blood culture 1 out of 4 positive for Pseudomonas On cefepime and vancomycin -> cefepime -> levaquin UA negative CXR negative ID consulted recommend Abx course of 7 days  Diabetes HgbA1c 5.7, goal < 7.0 Controlled CBG monitoring SSI DM education and close PCP follow up  Hypertension Stable Long term BP goal normotensive  Hyperlipidemia Home meds: None LDL 82, goal < 70 Now on pravastatin 20 No high intensity statin needed given advanced age and LDL near goal Continue statin at discharge  Other Stroke Risk Factors Advanced age Hx stroke/TIA on imaging Obstructive sleep apnea, on CPAP at home  Other Active Problems Dementia with home caregiver 24/7  Hospital day # 4  Neurology will sign off. Please call with questions. Pt will follow up with Dr Terrace Arabia at [facility] in about 4 weeks. Thanks for the consult.   Marvel Plan, MD PhD Stroke Neurology 10/11/2021 12:20 PM    To contact Stroke Continuity provider, please refer to [email or web address]. After hours, contact General Neurology

## 2021-10-11 NOTE — Plan of Care (Signed)
°  Problem: Education: °Goal: Knowledge of General Education information will improve °Description: Including pain rating scale, medication(s)/side effects and non-pharmacologic comfort measures °Outcome: Adequate for Discharge °  °Problem: Health Behavior/Discharge Planning: °Goal: Ability to manage health-related needs will improve °Outcome: Adequate for Discharge °  °Problem: Clinical Measurements: °Goal: Ability to maintain clinical measurements within normal limits will improve °Outcome: Adequate for Discharge °Goal: Will remain free from infection °Outcome: Adequate for Discharge °Goal: Diagnostic test results will improve °Outcome: Adequate for Discharge °Goal: Respiratory complications will improve °Outcome: Adequate for Discharge °Goal: Cardiovascular complication will be avoided °Outcome: Adequate for Discharge °  °Problem: Activity: °Goal: Risk for activity intolerance will decrease °Outcome: Adequate for Discharge °  °Problem: Nutrition: °Goal: Adequate nutrition will be maintained °Outcome: Adequate for Discharge °  °Problem: Coping: °Goal: Level of anxiety will decrease °Outcome: Adequate for Discharge °  °Problem: Elimination: °Goal: Will not experience complications related to bowel motility °Outcome: Adequate for Discharge °Goal: Will not experience complications related to urinary retention °Outcome: Adequate for Discharge °  °Problem: Pain Managment: °Goal: General experience of comfort will improve °Outcome: Adequate for Discharge °  °Problem: Safety: °Goal: Ability to remain free from injury will improve °Outcome: Adequate for Discharge °  °Problem: Skin Integrity: °Goal: Risk for impaired skin integrity will decrease °Outcome: Adequate for Discharge °  °Problem: Education: °Goal: Knowledge of disease or condition will improve °Outcome: Adequate for Discharge °Goal: Knowledge of secondary prevention will improve (SELECT ALL) °Outcome: Adequate for Discharge °Goal: Knowledge of patient specific  risk factors will improve (INDIVIDUALIZE FOR PATIENT) °Outcome: Adequate for Discharge °Goal: Individualized Educational Video(s) °Outcome: Adequate for Discharge °  °Problem: Coping: °Goal: Will verbalize positive feelings about self °Outcome: Adequate for Discharge °Goal: Will identify appropriate support needs °Outcome: Adequate for Discharge °  °Problem: Health Behavior/Discharge Planning: °Goal: Ability to manage health-related needs will improve °Outcome: Adequate for Discharge °  °Problem: Self-Care: °Goal: Ability to participate in self-care as condition permits will improve °Outcome: Adequate for Discharge °Goal: Verbalization of feelings and concerns over difficulty with self-care will improve °Outcome: Adequate for Discharge °Goal: Ability to communicate needs accurately will improve °Outcome: Adequate for Discharge °  °Problem: Nutrition: °Goal: Risk of aspiration will decrease °Outcome: Adequate for Discharge °Goal: Dietary intake will improve °Outcome: Adequate for Discharge °  °Problem: Ischemic Stroke/TIA Tissue Perfusion: °Goal: Complications of ischemic stroke/TIA will be minimized °Outcome: Adequate for Discharge °  °

## 2021-10-11 NOTE — Progress Notes (Signed)
Physical Therapy Treatment Patient Details Name: Jessica Hawkins MRN: [id number] DOB: Mar 19, 1922 Today's Date: 10/11/2021   History of Present Illness Pt is a 88 y/o female who presents 10/07/2021 with chills and rigors. 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 PMH significant for R BBB, CKD, HTN, hypothyroidism, OA, osteoporosis, DM II, memory deficits, B IM nail, ORIF R hip 2002, L rotator cuff repair 1999, B THR 2017/ 2018.    PT Comments    Patient seen while in prep for d/c.  Patient progressively able to get upright posture as initially with posterior bias.  She will have capable assistance at home as caregiver present and assisting throughout.  Continue to recommend HHPT at d/c.    Recommendations for follow up therapy are one component of a multi-disciplinary discharge planning process, led by the attending physician.  Recommendations may be updated based on patient status, additional functional criteria and insurance authorization.  Follow Up Recommendations  Home health PT     Assistance Recommended at Discharge Frequent or constant Supervision/Assistance  Patient can return home with the following A lot of help with walking and/or transfers;A lot of help with bathing/dressing/bathroom;Assist for transportation;Help with stairs or ramp for entrance   Equipment Recommendations  None recommended by PT    Recommendations for Other Services       Precautions / Restrictions Precautions Precautions: Fall Precaution Comments: Heavy posterior bias initially upon stand. Restrictions Weight Bearing Restrictions: No     Mobility  Bed Mobility               General bed mobility comments: on BSC upon PT entry    Transfers Overall transfer level: Needs assistance Equipment used: Rolling walker (2 wheels) Transfers: Sit to/from Stand, Bed to chair/wheelchair/BSC Sit to Stand: Max assist, Mod assist, +2 physical assistance     Squat pivot transfers: +2 physical  assistance, Min assist     General transfer comment: Max verbal/tactile cues for squat pivot; min physical A    Ambulation/Gait Ambulation/Gait assistance: Min assist, +2 safety/equipment Gait Distance (Feet): 30 Feet Assistive device: Rolling walker (2 wheels) Gait Pattern/deviations: Step-to pattern, Step-through pattern, Decreased stride length, Leaning posteriorly, Shuffle       General Gait Details: very posterior initially leaning back and needing cues for foot position and anterior weight shift.  Improved after prolonged time working on bathing and dressing with OT and allowed to pull up on sink; during ambulation to wheelchair for d/c patient min A of 2 for safety   Stairs             Wheelchair Mobility    Modified Rankin (Stroke Patients Only) Modified Rankin (Stroke Patients Only) Pre-Morbid Rankin Score: Moderately severe disability Modified Rankin: Moderately severe disability     Balance Overall balance assessment: Needs assistance Sitting-balance support: Feet supported Sitting balance-Leahy Scale: Poor Sitting balance - Comments: BUE bracing on bed and up to mod A without BUE support     Standing balance-Leahy Scale: Poor                              Cognition Arousal/Alertness: Awake/alert Behavior During Therapy: WFL for tasks assessed/performed Overall Cognitive Status: History of cognitive impairments - at baseline Area of Impairment: Orientation, Attention, Memory, Following commands, Safety/judgement, Awareness, Problem solving                 Orientation Level: Disoriented to, Time, Situation Current Attention  Level: Focused Memory: Decreased short-term memory Following Commands: Follows one step commands inconsistently, Follows one step commands with increased time Safety/Judgement: Decreased awareness of deficits, Decreased awareness of safety   Problem Solving: Slow processing, Decreased initiation, Difficulty  sequencing, Requires verbal cues, Requires tactile cues General Comments: h/o dementia, has caregivers at baseline.        Exercises      General Comments General comments (skin integrity, edema, etc.): Caregiver present and assisting as pt discharging home and toileting, bathing and dressing with OT assistance; PT assisting to obtain items and walk to wheelchair prior to d/c      Pertinent Vitals/Pain Pain Assessment Pain Assessment: Faces Faces Pain Scale: Hurts a little bit Pain Location: L hip discomfort with stance in L during ambulation Pain Descriptors / Indicators: Guarding Pain Intervention(s): Monitored during session    Home Living                          Prior Function            PT Goals (current goals can now be found in the care plan section) Progress towards PT goals: Progressing toward goals    Frequency    Min 3X/week      PT Plan Current plan remains appropriate    Co-evaluation   Reason for Co-Treatment: For patient/therapist safety;To address functional/ADL transfers PT goals addressed during session: Mobility/safety with mobility;Proper use of DME OT goals addressed during session: ADL's and self-care      AM-PAC PT "6 Clicks" Mobility   Outcome Measure  Help needed turning from your back to your side while in a flat bed without using bedrails?: A Lot Help needed moving from lying on your back to sitting on the side of a flat bed without using bedrails?: A Lot Help needed moving to and from a bed to a chair (including a wheelchair)?: Total Help needed standing up from a chair using your arms (e.g., wheelchair or bedside chair)?: Total Help needed to walk in hospital room?: Total Help needed climbing 3-5 steps with a railing? : Total 6 Click Score: 8    End of Session Equipment Utilized During Treatment: Gait belt Activity Tolerance: Patient tolerated treatment well Patient left: in chair;with family/visitor present   PT  Visit Diagnosis: Unsteadiness on feet (R26.81);Muscle weakness (generalized) (M62.81);Other symptoms and signs involving the nervous system (R29.898)     Time: 1010-1030 PT Time Calculation (min) (ACUTE ONLY): 20 min  Charges:  $Gait Training: 8-22 mins                     Sheran Lawless, PT Acute Rehabilitation Services [phone] 10/11/2021    Elray Mcgregor 10/11/2021, 2:31 PM

## 2021-10-11 NOTE — TOC Transition Note (Signed)
Transition of Care [facility]) - CM/SW Discharge Note   Patient Details  Name: VON INSCOE MRN: [id number] Date of Birth: 07/29/1922  [facility] CM/SW Contact:  Kermit Balo, RN Phone Number: 10/11/2021, 9:57 AM   Clinical Narrative:    Pt discharging home with home health services through [facility] home health. Information on the AVS.  Pt has needed DME at home.  Pt has 24 hour supervision at home and transportation home.    Final next level of care: Home w Home Health Services Barriers to Discharge: No Barriers Identified   Patient Goals and CMS Choice   CMS Medicare.gov Compare Post Acute Care list provided to:: Patient Represenative (must comment) Choice offered to / list presented to : Adult Children, HC POA / Guardian  Discharge Placement                       Discharge Plan and Services   Discharge Planning Services: CM Consult Post Acute Care Choice: Home Health                    HH Arranged: PT, OT HH Agency: [facility] Date [facility] Agency Contacted: 10/09/21   Representative spoke with at [facility] Agency: Tresa Endo  Social Determinants of Health (SDOH) Interventions     Readmission Risk Interventions     No data to display

## 2021-10-11 NOTE — Discharge Summary (Signed)
Physician Discharge Summary  Jessica Hawkins [id number] DOB: 1922/08/07 DOA: 10/07/2021  PCP: Deland Pretty, MD  Admit date: 10/07/2021 Discharge date: 10/11/2021  Admitted From: Home Disposition: Home with home health, 24/7 caretaker support  Recommendations for Outpatient Follow-up:  Follow up with PCP in 1-2 weeks Please obtain BMP/CBC in one week   Home Health: PT/OT Equipment/Devices: None  Discharge Condition: Fair CODE STATUS: Full code Diet recommendation: Low-salt, regular diet  Discharge summary: Soon to be 88 year old who lives at home with family and 24/7 caretaker support, has history of hypertension, hypothyroidism, dementia, type 2 diabetes and chronic anemia brought to the ER when she started having chills and rigors.  Recently suffered from right lower leg cellulitis and was treated with doxycycline and improved.  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 Patient was febrile on presentation, found to have Pseudomonas bacteremia that quickly defervesced and blood cultures also cleared. Remained in the hospital on IV antibiotics pending clinical improvement.  She developed some hospital-acquired delirium but ultimately improved.  Treated for following conditions.    Pseudomonas bacteremia: Primary source unknown. UA normal.  Urine cultures x 2 without growth.  Respiratory viral panel negative.  Chest x-ray normal.   Blood cultures on admission, Pseudomonas Blood cultures after initial antibiotics, drawn after first dose of antibiotics were negative. Does have mild diffuse cellulitis of legs, no abscess. Treated with cefepime, day 4 today.   WBC count improving.  Fever improving. With new onset of stroke, there is possibility of bacterial endocarditis however with negative repeat culture and quick improvement of symptoms, given her frailty and developing delirium subjecting her through TEE may not be beneficial (88).  Discussed in detail with family and decided to pursue  more conservative approach. Levaquin 750 mg once today. Levaquin 500 mg once on 8/12, once on 8/14.   Acute right MCA territory stroke: Presented with chills rigors and weakness at home. CT head findings, no acute findings.  Old left frontal infarct. MRI of the brain, small right cerebellum cortical infarct. MRA of the brain, no large vessel occlusion.  No blood clot. Carotid duplexes, no hemodynamically significant obstruction. 2D echocardiogram, normal ejection fraction.  No vegetations.  No thrombus. Antiplatelet therapy, patient was on aspirin at home.  Neurology recommended aspirin and Plavix for 3 weeks then Plavix alone.   LDL, 82.  With poor appetite and frailty, statin will add more drug interactions, complications than long-term benefits.  This was discussed with family.  Will stay away from statins. Hemoglobin A1c, 5.7.  No indication for treatment.  Encouraged regular diet and nutritional supplements. With Pseudomonas bacteremia, may benefit with TEE, however TTE not showing any vegetation as well no evidence of persistent bacteremia.  Will not pursue TEE given frailty, debility and age (88).  Seen by neurology in the hospital. PT/OT.  At home with 24/7 care.  Will resume home health care.   Essential hypertension: Blood pressure stable.  Renal functions fluctuates.  Blood pressures are normal without use of any antihypertensives.  Will discontinue her Maxizide (triamterene hydrochlorothiazide ) to avoid further renal compromise.   Hospital-acquired delirium in a patient with dementia: No more events last night.  Mental status remains at about her usual self as evidenced by caretakers and family.  AKI: Her creatinine fluctuates but mostly on her usual levels.  Please recheck in 1 week to ensure stabilization.  Urine output is adequate.  Chronically sick, frail or debilitated.  Fairly stable today to go home.  She has very  good support system at home.   Discharge Diagnoses:   Principal Problem:   Acute CVA (cerebrovascular accident) [facility]) Active Problems:   HTN (hypertension)   Left leg cellulitis   Dementia ([place])   Fever   Bacteremia due to Pseudomonas    Discharge Instructions  Discharge Instructions     Diet general   Complete by: As directed    Increase activity slowly   Complete by: As directed       Allergies as of 10/11/2021       Reactions   Penicillins Other (See Comments)   Other reaction(s): yeast infection        Medication List     STOP taking these medications    aspirin EC 81 MG tablet Replaced by: aspirin 81 MG chewable tablet   triamterene-hydrochlorothiazide 37.5-25 MG tablet Commonly known as: MAXZIDE-25       TAKE these medications    acetaminophen 325 MG tablet Commonly known as: TYLENOL Take 650 mg by mouth every 6 (six) hours as needed for mild pain.   allopurinol 100 MG tablet Commonly known as: ZYLOPRIM Take 100 mg by mouth daily.   amLODipine 5 MG tablet Commonly known as: NORVASC Take 5 mg by mouth daily as needed (bystolic blood pressure).   aspirin 81 MG chewable tablet Chew 1 tablet (81 mg total) by mouth daily for 21 days. Replaces: aspirin EC 81 MG tablet   clopidogrel 75 MG tablet Commonly known as: PLAVIX Take 1 tablet (75 mg total) by mouth daily.   coconut oil Oil 1 application as needed. 1 tsp   ferrous sulfate 325 (65 FE) MG tablet Take 3 mg by mouth. Take 3 tablets daily.   Florastor 250 MG capsule Generic drug: saccharomyces boulardii Take 250 mg by mouth 2 (two) times daily.   folic acid 1 MG tablet Commonly known as: FOLVITE Take 1 tablet (1 mg total) by mouth daily.   gabapentin 100 MG capsule Commonly known as: NEURONTIN Take 1 capsule (100 mg total) by mouth daily for 7 days. What changed: how much to take   Gemtesa 75 MG Tabs Generic drug: Vibegron Take 75 mg by mouth every morning.   glucose blood test strip One Touch Verio Test Strips. Use to test  blood sugar once daily. Dx:E11.9   levofloxacin 500 MG tablet Commonly known as: LEVAQUIN Take 1 tablet (500 mg total) by mouth every other day for 4 days. Start taking on: October 13, 2021   levothyroxine 25 MCG tablet Commonly known as: SYNTHROID take 1 tablet by mouth once daily for THYROID SUPPLEMENT What changed: See the new instructions.   lidocaine 4 % cream Commonly known as: Aspercreme w/Lidocaine Apply 4 times daily to right shoulder What changed:  how much to take how to take this when to take this reasons to take this   multivitamin with minerals Tabs tablet Take 1 tablet by mouth daily. Centrum   NON FORMULARY Take 2 capsules by mouth 2 (two) times daily. Neurstem. 2 capsules in the morning  and 2 capsules in the evening   OMEGA-3 FISH OIL PO Take 1 tablet by mouth daily.   OneTouch Delica Lancets Fine Misc Use to test sugar once daily. Dx:E11.9   silver sulfADIAZINE 1 % cream Commonly known as: Silvadene Apply to toes once dialy as needed for any wounds. What changed:  how much to take how to take this when to take this reasons to take this additional instructions   sodium chloride 0.65 %  Soln nasal spray Commonly known as: OCEAN Place 2 sprays into both nostrils as needed for congestion.   SYSTANE CONTACTS OP Place 1 drop into both eyes 2 (two) times daily.   Udderly Smooth Crea Apply 1 application topically 2 (two) times daily as needed (for dry skin related to healing cellulitis).   VITAMIN B12 PO Take 7 drops by mouth daily.   VITAMIN D (ERGOCALCIFEROL) PO Take 3 drops by mouth daily.        Follow-up Information     [facility]   Specialty: Home Health Services Why: The home health agency will contact you for the first home visit Contact information: [place] [address] [phone]                Allergies  Allergen Reactions   Penicillins Other (See Comments)    Other  reaction(s): yeast infection    Consultations: Neurology Infectious disease   Procedures/Studies: VAS US CAROTID  Result Date: 10/09/2021 Carotid Arterial Duplex Study Patient Name:  Jessica Hawkins  Date of Exam:   10/08/2021 Medical Rec #: [id number]        Accession #:    [id number] Date of Birth: 10-Apr-1922        Patient Gender: F Patient Age:   30 years Exam Location:  [facility] Procedure:      VAS US CAROTID Referring Phys: Gean Birchwood --------------------------------------------------------------------------------  Indications:      CVA and Speech disturbance. Risk Factors:     Hypertension, Diabetes. Other Factors:    Dementia, CKD. Limitations       Today's exam was limited due to altered mental status,                   tortuosity. Comparison Study: No prior study on file Performing Technologist: Sharion Dove RVS  Examination Guidelines: A complete evaluation includes B-mode imaging, spectral Doppler, color Doppler, and power Doppler as needed of all accessible portions of each vessel. Bilateral testing is considered an integral part of a complete examination. Limited examinations for reoccurring indications may be performed as noted.  Right Carotid Findings: +----------+--------+--------+--------+------------------+------------------+           PSV cm/sEDV cm/sStenosisPlaque DescriptionComments           +----------+--------+--------+--------+------------------+------------------+ CCA Prox  24      3                                 intimal thickening +----------+--------+--------+--------+------------------+------------------+ CCA Distal40      6                                 intimal thickening +----------+--------+--------+--------+------------------+------------------+ ICA Prox  53      13              heterogenous                         +----------+--------+--------+--------+------------------+------------------+ ICA Mid   71      8                                  tortuous           +----------+--------+--------+--------+------------------+------------------+ ICA Distal70      12  tortuous           +----------+--------+--------+--------+------------------+------------------+ ECA       63      9                                                    +----------+--------+--------+--------+------------------+------------------+ +----------+--------+-------+--------+-------------------+           PSV cm/sEDV cmsDescribeArm Pressure (mmHG) +----------+--------+-------+--------+-------------------+ Subclavian50                                         +----------+--------+-------+--------+-------------------+ +---------+--------+--+--------+-+ VertebralPSV cm/s41EDV cm/s1 +---------+--------+--+--------+-+  Left Carotid Findings: +----------+--------+--------+--------+------------------+------------------+           PSV cm/sEDV cm/sStenosisPlaque DescriptionComments           +----------+--------+--------+--------+------------------+------------------+ CCA Prox  50      4                                 intimal thickening +----------+--------+--------+--------+------------------+------------------+ CCA Distal50      12                                intimal thickening +----------+--------+--------+--------+------------------+------------------+ ICA Prox  44      8               heterogenous      tortuous           +----------+--------+--------+--------+------------------+------------------+ ICA Mid   42      7                                 tortuous           +----------+--------+--------+--------+------------------+------------------+ ICA Distal44      5                                 tortuous           +----------+--------+--------+--------+------------------+------------------+ ECA       65      4                                                     +----------+--------+--------+--------+------------------+------------------+ +----------+--------+--------+--------+-------------------+           PSV cm/sEDV cm/sDescribeArm Pressure (mmHG) +----------+--------+--------+--------+-------------------+ [id number]                                          +----------+--------+--------+--------+-------------------+ +---------+--------+--+--------+-+ VertebralPSV cm/s48EDV cm/s4 +---------+--------+--+--------+-+   Summary: Right Carotid: The extracranial vessels were near-normal with only minimal wall                thickening or plaque. Left Carotid: The extracranial vessels were near-normal with only minimal wall               thickening or plaque.  Vertebrals:  Bilateral vertebral arteries demonstrate antegrade flow. Subclavians: Normal flow hemodynamics were seen in bilateral subclavian              arteries. *See table(s) above for measurements and observations.  Electronically signed by Antony Contras MD on 10/09/2021 at 8:18:28 AM.    Final    EEG adult  Result Date: 10/08/2021 Lora Havens, MD     10/08/2021  4:50 PM Patient Name: Jessica Hawkins MRN: [id number] Epilepsy Attending: Lora Havens Referring Physician/Provider: Rosalin Hawking, MD Date: 10/08/2021 Duration: 22.12 mins Patient history:  88yo F with acute small subdural cortical infarct. EEG to evaluate for seizure Level of alertness: Awake AEDs during EEG study: None Technical aspects: This EEG study was done with scalp electrodes positioned according to the 10-20 International system of electrode placement. Electrical activity was reviewed with band pass filter of 1-70Hz , sensitivity of 7 uV/mm, display speed of 30mm/sec with a 60Hz  notched filter applied as appropriate. EEG data were recorded continuously and digitally stored.  Video monitoring was available and reviewed as appropriate. Description: The posterior dominant rhythm consists of 8 Hz activity of moderate voltage (25-35  uV) seen predominantly in posterior head regions, symmetric and reactive to eye opening and eye closing. EEG showed intermittent generalized 3 to 6 Hz theta-delta slowing.  Hyperventilation and photic stimulation were not performed.   ABNORMALITY - Intermittent slow, generalized IMPRESSION: This study is suggestive of mild diffuse encephalopathy, nonspecific etiology. No seizures or epileptiform discharges were seen throughout the recording. Priyanka O Yadav   VAS [place] LOWER EXTREMITY VENOUS (DVT)  Result Date: 10/08/2021  Lower Venous DVT Study Patient Name:  Jessica Hawkins  Date of Exam:   10/08/2021 Medical Rec #: [id number]        Accession #:    [id number] Date of Birth: Nov 25, 1922        Patient Gender: F Patient Age:   68 years Exam Location:  [facility] Procedure:      VAS [place] LOWER EXTREMITY VENOUS (DVT) Referring Phys: Cornelius Moras XU --------------------------------------------------------------------------------  Indications: Stroke.  Limitations: Altered mental status, unable to position patient's legs adequately and body habitus. Comparison Study: Prior negative left LEV done 03/23/18 Performing Technologist: Sharion Dove RVS  Examination Guidelines: A complete evaluation includes B-mode imaging, spectral Doppler, color Doppler, and power Doppler as needed of all accessible portions of each vessel. Bilateral testing is considered an integral part of a complete examination. Limited examinations for reoccurring indications may be performed as noted. The reflux portion of the exam is performed with the patient in reverse Trendelenburg.  +---------+---------------+---------+-----------+----------+-------------------+ RIGHT    CompressibilityPhasicitySpontaneityPropertiesThrombus Aging      +---------+---------------+---------+-----------+----------+-------------------+ CFV      Full           Yes      Yes                                       +---------+---------------+---------+-----------+----------+-------------------+ SFJ      Full                                                             +---------+---------------+---------+-----------+----------+-------------------+ FV Prox  Full                                                             +---------+---------------+---------+-----------+----------+-------------------+  FV Mid   Full                                                             +---------+---------------+---------+-----------+----------+-------------------+ FV DistalFull                                                             +---------+---------------+---------+-----------+----------+-------------------+ PFV      Full                                                             +---------+---------------+---------+-----------+----------+-------------------+ POP                     Yes      Yes                  patent by color and                                                       Doppler             +---------+---------------+---------+-----------+----------+-------------------+ PTV                                                   Not well visualized +---------+---------------+---------+-----------+----------+-------------------+ PERO                                                  Not well visualized +---------+---------------+---------+-----------+----------+-------------------+   +---------+---------------+---------+-----------+----------+-------------------+ LEFT     CompressibilityPhasicitySpontaneityPropertiesThrombus Aging      +---------+---------------+---------+-----------+----------+-------------------+ CFV      Full                                                             +---------+---------------+---------+-----------+----------+-------------------+ SFJ      Full                                                              +---------+---------------+---------+-----------+----------+-------------------+ FV Prox  Full           Yes      Yes                                      +---------+---------------+---------+-----------+----------+-------------------+  FV Mid   Full                                                             +---------+---------------+---------+-----------+----------+-------------------+ FV DistalFull                                                             +---------+---------------+---------+-----------+----------+-------------------+ PFV      Full           Yes      Yes                                      +---------+---------------+---------+-----------+----------+-------------------+ POP                                                   patent by color and                                                       Doppler             +---------+---------------+---------+-----------+----------+-------------------+ PTV                                                   Not well visualized +---------+---------------+---------+-----------+----------+-------------------+ PERO                                                  Not well visualized +---------+---------------+---------+-----------+----------+-------------------+     Summary: RIGHT: - There is no evidence of deep vein thrombosis in the lower extremity. However, portions of this examination were limited- see technologist comments above.  LEFT: - There is no evidence of deep vein thrombosis in the lower extremity. However, portions of this examination were limited- see technologist comments above.  *See table(s) above for measurements and observations. Electronically signed by Servando Snare MD on 10/08/2021 at 1:35:46 PM.    Final    ECHOCARDIOGRAM COMPLETE  Result Date: 10/08/2021    ECHOCARDIOGRAM REPORT   Patient Name:   Jessica Hawkins Date of Exam: 10/08/2021 Medical Rec #:  [id number]       Height:        56.3 in Accession #:    [id number]      Weight:       120.0 lb Date of Birth:  11-18-1922       BSA:          1.434 m Patient Age:    57 years  BP:           133/68 mmHg Patient Gender: F               HR:           75 bpm. Exam Location:  Inpatient Procedure: 2D Echo Indications:    stroke  History:        Patient has no prior history of Echocardiogram examinations.                 Chronic kidney disease, Signs/Symptoms:Fever; Risk                 Factors:Hypertension, Dyslipidemia and Diabetes.  Sonographer:    Johny Chess RDCS Sonographer#2:  Greer Pickerel Referring Phys: [place]  1. Left ventricular ejection fraction, by estimation, is 65 to 70%. The left ventricle has normal function. The left ventricle has no regional wall motion abnormalities. Left ventricular diastolic parameters are consistent with Grade I diastolic dysfunction (impaired relaxation).  2. Right ventricular systolic function is normal. The right ventricular size is normal. There is mildly elevated pulmonary artery systolic pressure. The estimated right ventricular systolic pressure is 123XX123 mmHg.  3. Left atrial size was moderately dilated.  4. Right atrial size was mildly dilated.  5. The mitral valve is normal in structure. No evidence of mitral valve regurgitation. No evidence of mitral stenosis.  6. Tricuspid valve regurgitation is moderate.  7. The aortic valve is tricuspid. There is moderate calcification of the aortic valve. There is moderate thickening of the aortic valve. Aortic valve regurgitation is moderate. Aortic valve sclerosis is present, with no evidence of aortic valve stenosis.  8. The inferior vena cava is dilated in size with >50% respiratory variability, suggesting right atrial pressure of 8 mmHg. Conclusion(s)/Recommendation(s): No intracardiac source of embolism detected on this transthoracic study. Consider a transesophageal echocardiogram to exclude cardiac source of embolism  if clinically indicated. FINDINGS  Left Ventricle: Left ventricular ejection fraction, by estimation, is 65 to 70%. The left ventricle has normal function. The left ventricle has no regional wall motion abnormalities. The left ventricular internal cavity size was normal in size. There is  no left ventricular hypertrophy. Left ventricular diastolic parameters are consistent with Grade I diastolic dysfunction (impaired relaxation). Right Ventricle: The right ventricular size is normal. No increase in right ventricular wall thickness. Right ventricular systolic function is normal. There is mildly elevated pulmonary artery systolic pressure. The tricuspid regurgitant velocity is 3.00  m/s, and with an assumed right atrial pressure of 8 mmHg, the estimated right ventricular systolic pressure is 123XX123 mmHg. Left Atrium: Left atrial size was moderately dilated. Right Atrium: Right atrial size was mildly dilated. Pericardium: There is no evidence of pericardial effusion. Mitral Valve: The mitral valve is normal in structure. No evidence of mitral valve regurgitation. No evidence of mitral valve stenosis. Tricuspid Valve: The tricuspid valve is normal in structure. Tricuspid valve regurgitation is moderate . No evidence of tricuspid stenosis. Aortic Valve: The aortic valve is tricuspid. There is moderate calcification of the aortic valve. There is moderate thickening of the aortic valve. Aortic valve regurgitation is moderate. Aortic regurgitation PHT measures 318 msec. Aortic valve sclerosis  is present, with no evidence of aortic valve stenosis. Aortic valve mean gradient measures 11.0 mmHg. Aortic valve peak gradient measures 19.8 mmHg. Aortic valve area, by VTI measures 2.38 cm. Pulmonic Valve: The pulmonic valve was normal in structure. Pulmonic valve regurgitation is mild. No evidence of pulmonic stenosis.  Aorta: The aortic root is normal in size and structure. Venous: The inferior vena cava is dilated in size with  greater than 50% respiratory variability, suggesting right atrial pressure of 8 mmHg. IAS/Shunts: No atrial level shunt detected by color flow Doppler.  LEFT VENTRICLE PLAX 2D LVIDd:         4.50 cm   Diastology LVIDs:         3.00 cm   LV e' medial:    5.55 cm/s LV PW:         1.00 cm   LV E/e' medial:  17.8 LV IVS:        0.90 cm   LV e' lateral:   8.38 cm/s LVOT diam:     2.00 cm   LV E/e' lateral: 11.8 LV SV:         94 LV SV Index:   66 LVOT Area:     3.14 cm  RIGHT VENTRICLE             IVC RV Basal diam:  2.80 cm     IVC diam: 2.00 cm RV S prime:     14.50 cm/s TAPSE (M-mode): 2.9 cm LEFT ATRIUM             Index        RIGHT ATRIUM           Index LA diam:        3.20 cm 2.23 cm/m   RA Area:     15.90 cm LA Vol (A2C):   68.3 ml 47.63 ml/m  RA Volume:   35.90 ml  25.04 ml/m LA Vol (A4C):   50.3 ml 35.08 ml/m LA Biplane Vol: 62.8 ml 43.80 ml/m  AORTIC VALVE AV Area (Vmax):    2.15 cm AV Area (Vmean):   2.04 cm AV Area (VTI):     2.38 cm AV Vmax:           222.50 cm/s AV Vmean:          151.000 cm/s AV VTI:            0.395 m AV Peak Grad:      19.8 mmHg AV Mean Grad:      11.0 mmHg LVOT Vmax:         152.00 cm/s LVOT Vmean:        98.100 cm/s LVOT VTI:          0.299 m LVOT/AV VTI ratio: 0.76 AI PHT:            318 msec  AORTA Ao Asc diam: 3.20 cm MITRAL VALVE                TRICUSPID VALVE MV Area (PHT): 4.57 cm     TR Peak grad:   36.0 mmHg MV Decel Time: 166 msec     TR Vmax:        300.00 cm/s MV E velocity: 99.00 cm/s MV A velocity: 119.00 cm/s  SHUNTS MV E/A ratio:  0.83         Systemic VTI:  0.30 m                             Systemic Diam: 2.00 cm Candee Furbish MD Electronically signed by Candee Furbish MD Signature Date/Time: 10/08/2021/12:07:00 PM    Final    MR ANGIO HEAD WO CONTRAST  Result Date: 10/08/2021 CLINICAL DATA:  Acute neurologic deficit  EXAM: MRA HEAD WITHOUT CONTRAST TECHNIQUE: Angiographic images of the Circle of Willis were acquired using MRA technique without intravenous  contrast. COMPARISON:  None Available. FINDINGS: POSTERIOR CIRCULATION: --Vertebral arteries: Normal --Inferior cerebellar arteries: Normal. --Basilar artery: Normal. --Superior cerebellar arteries: Normal. --Posterior cerebral arteries: Normal. ANTERIOR CIRCULATION: --Intracranial internal carotid arteries: Normal. --Anterior cerebral arteries (ACA): Normal. --Middle cerebral arteries (MCA): Normal. ANATOMIC VARIANTS: Both P comms are patent. IMPRESSION: Normal intracranial MRA. Electronically Signed   By: Ulyses Jarred M.D.   On: 10/08/2021 02:58   MR BRAIN WO CONTRAST  Result Date: 10/07/2021 CLINICAL DATA:  Aphasia EXAM: MRI HEAD WITHOUT CONTRAST TECHNIQUE: Multiplanar, multiecho pulse sequences of the brain and surrounding structures were obtained without intravenous contrast. COMPARISON:  04/25/2017 FINDINGS: Brain: Small focus of acute ischemia in the right cerebellar hemisphere. Old left frontal infarct. Chronic hemo siderosis at the site of the left frontal remote infarct. There is multifocal hyperintense T2-weighted signal within the white matter. Generalized volume loss. The midline structures are normal. Vascular: Major flow voids are preserved. Skull and upper cervical spine: Normal calvarium and skull base. Visualized upper cervical spine and soft tissues are normal. Sinuses/Orbits:No paranasal sinus fluid levels or advanced mucosal thickening. No mastoid or middle ear effusion. Normal orbits. IMPRESSION: 1. Small focus of acute ischemia in the right cerebellar hemisphere. No hemorrhage or mass effect. 2. 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 Electronically Signed   By: Ulyses Jarred M.D.   On: 10/07/2021 23:32   CT HEAD WO CONTRAST (5MM)  Result Date: 10/07/2021 CLINICAL DATA:  Mental status change. EXAM: CT HEAD WITHOUT CONTRAST TECHNIQUE: Contiguous axial images were obtained from the base of the skull through the vertex without intravenous contrast.  RADIATION DOSE REDUCTION: This exam was performed according to the departmental dose-optimization program which includes automated exposure control, adjustment of the mA and/or kV according to patient size and/or use of iterative reconstruction technique. COMPARISON:  None Available. FINDINGS: Brain: Area of hypoattenuation in the left frontoparietal region spends the cortex and the subcortical white matter no evidence of hydrocephalus or mass effect. Mild brain parenchymal volume loss and deep white matter microangiopathy for age. Vascular: No hyperdense vessel or unexpected calcification. Skull: Normal. Negative for fracture or focal lesion. Sinuses/Orbits: No acute finding. Other: None. IMPRESSION: 1. Area of hypoattenuation in the left frontoparietal region spends the cortex and the subcortical white matter, which may represent an acute or subacute infarct. Further evaluation with MRI of the brain may be considered, if found clinically appropriate. 2. Mild brain parenchymal volume loss and deep white matter microangiopathy for age. Electronically Signed   By: Fidela Salisbury M.D.   On: 10/07/2021 21:09   DG Chest Port 1 View  Result Date: 10/07/2021 CLINICAL DATA:  Altered mental status EXAM: PORTABLE CHEST 1 VIEW COMPARISON:  05/05/2021 FINDINGS: Heart is borderline in size. Aortic atherosclerosis. No confluent airspace opacities or effusions. No acute bony abnormality. IMPRESSION: No active disease. Electronically Signed   By: Rolm Baptise M.D.   On: 10/07/2021 21:00   (Echo, Carotid, EGD, Colonoscopy, ERCP)    Subjective: Patient seen and examined.  Daughter and caretaker at the bedside.  Cheerful and asking for her dress to go home.  Patient denies any complaints.  Pleasantly confused but looks fairly comfortable today.  Forgetful but mentation at her usual self as per family.  Remains afebrile. No more agitation or impulsiveness last night.  Slept well.    Discharge Exam: Vitals:  10/11/21 0331 10/11/21 0806  BP: (!) 130/50 (!) 117/47  Pulse: 63 61  Resp: 20 14  Temp: 98.4 F (36.9 C) 97.6 F (36.4 C)  SpO2: 97% 99%   Vitals:   10/10/21 1951 10/11/21 0002 10/11/21 0331 10/11/21 0806  BP: 136/72 (!) 118/48 (!) 130/50 (!) 117/47  Pulse: 78 63 63 61  Resp: 18 16 20 14   Temp: 98.2 F (36.8 C) 97.7 F (36.5 C) 98.4 F (36.9 C) 97.6 F (36.4 C)  TempSrc: Oral Oral Oral Oral  SpO2: 100% 98% 97% 99%  Weight:      Height:        General: Pt is alert, awake, not in acute distress Frail and debilitated.  Appropriate for age.  Forgetful and keeps asking same questions which is normal for her.  Pleasant and confused. Cardiovascular: RRR, S1/S2 +, no rubs, no gallops Respiratory: CTA bilaterally, no wheezing, no rhonchi Abdominal: Soft, NT, ND, bowel sounds + Extremities: Right leg with nontender small nodule, left leg with mild swelling and edema, some subcutaneous edema without fluctuation, without evidence of spreading inflammation.    The results of significant diagnostics from this hospitalization (including imaging, microbiology, ancillary and laboratory) are listed below for reference.     Microbiology: Recent Results (from the past 240 hour(s))  Urine Culture     Status: None   Collection Time: 10/07/21  8:22 PM   Specimen: In/Out Cath Urine  Result Value Ref Range Status   Specimen Description IN/OUT CATH URINE  Final   Special Requests NONE  Final   Culture   Final    NO GROWTH Performed at [facility], [address], [place], [place]    Report Status 10/08/2021 FINAL  Final  SARS Coronavirus 2 by RT PCR (hospital order, performed in [facility] hospital lab) *cepheid single result test* Anterior Nasal Swab     Status: None   Collection Time: 10/07/21  8:40 PM   Specimen: Anterior Nasal Swab  Result Value Ref Range Status   SARS Coronavirus 2 by RT PCR NEGATIVE NEGATIVE Final    Comment: (NOTE) SARS-CoV-2 target nucleic  acids are NOT DETECTED.  The SARS-CoV-2 RNA is generally detectable in upper and lower respiratory specimens during the acute phase of infection. The lowest concentration of SARS-CoV-2 viral copies this assay can detect is 250 copies / mL. A negative result does not preclude SARS-CoV-2 infection and should not be used as the sole basis for treatment or other patient management decisions.  A negative result may occur with improper specimen collection / handling, submission of specimen other than nasopharyngeal swab, presence of viral mutation(s) within the areas targeted by this assay, and inadequate number of viral copies (<250 copies / mL). A negative result must be combined with clinical observations, patient history, and epidemiological information.  Fact Sheet for Patients:   12/07/21  Fact Sheet for Healthcare Providers: RoadLapTop.co.za  This test is not yet approved or  cleared by the http://kim-miller.com/ FDA and has been authorized for detection and/or diagnosis of SARS-CoV-2 by FDA under an Emergency Use Authorization (EUA).  This EUA will remain in effect (meaning this test can be used) for the duration of the COVID-19 declaration under Section 564(b)(1) of the Act, 21 U.S.C. section 360bbb-3(b)(1), unless the authorization is terminated or revoked sooner.  Performed at [facility], [address], [place], [place]   Blood culture (routine x 2)     Status: Abnormal  Collection Time: 10/07/21  8:40 PM   Specimen: BLOOD  Result Value Ref Range Status   Specimen Description BLOOD RIGHT ANTECUBITAL  Final   Special Requests   Final    BOTTLES DRAWN AEROBIC AND ANAEROBIC Blood Culture results may not be optimal due to an inadequate volume of blood received in culture bottles   Culture  Setup Time   Final    GRAM NEGATIVE RODS BOTTLES DRAWN AEROBIC ONLY CRITICAL RESULT CALLED TO, READ BACK BY AND  VERIFIED WITH: PHARMD C. AMEND [id number] @1806  FH Performed at [facility], [place] [address], [place], [place] [address]    Culture PSEUDOMONAS AERUGINOSA (A)  Final   Report Status 10/10/2021 FINAL  Final   Organism ID, Bacteria PSEUDOMONAS AERUGINOSA  Final      Susceptibility   Pseudomonas aeruginosa - MIC*    CEFTAZIDIME 4 SENSITIVE Sensitive     CIPROFLOXACIN <=0.25 SENSITIVE Sensitive     GENTAMICIN <=1 SENSITIVE Sensitive     IMIPENEM 2 SENSITIVE Sensitive     PIP/TAZO 8 SENSITIVE Sensitive     CEFEPIME 2 SENSITIVE Sensitive     * PSEUDOMONAS AERUGINOSA  Blood Culture ID Panel (Reflexed)     Status: Abnormal   Collection Time: 10/07/21  8:40 PM  Result Value Ref Range Status   Enterococcus faecalis NOT DETECTED NOT DETECTED Final   Enterococcus Faecium NOT DETECTED NOT DETECTED Final   Listeria monocytogenes NOT DETECTED NOT DETECTED Final   Staphylococcus species NOT DETECTED NOT DETECTED Final   Staphylococcus aureus (BCID) NOT DETECTED NOT DETECTED Final   Staphylococcus epidermidis NOT DETECTED NOT DETECTED Final   Staphylococcus lugdunensis NOT DETECTED NOT DETECTED Final   Streptococcus species NOT DETECTED NOT DETECTED Final   Streptococcus agalactiae NOT DETECTED NOT DETECTED Final   Streptococcus pneumoniae NOT DETECTED NOT DETECTED Final   Streptococcus pyogenes NOT DETECTED NOT DETECTED Final   A.calcoaceticus-baumannii NOT DETECTED NOT DETECTED Final   Bacteroides fragilis NOT DETECTED NOT DETECTED Final   Enterobacterales NOT DETECTED NOT DETECTED Final   Enterobacter cloacae complex NOT DETECTED NOT DETECTED Final   Escherichia coli NOT DETECTED NOT DETECTED Final   Klebsiella aerogenes NOT DETECTED NOT DETECTED Final   Klebsiella oxytoca NOT DETECTED NOT DETECTED Final   Klebsiella pneumoniae NOT DETECTED NOT DETECTED Final   Proteus species NOT DETECTED NOT DETECTED Final   Salmonella species NOT DETECTED NOT DETECTED Final   Serratia marcescens NOT  DETECTED NOT DETECTED Final   Haemophilus influenzae NOT DETECTED NOT DETECTED Final   Neisseria meningitidis NOT DETECTED NOT DETECTED Final   Pseudomonas aeruginosa DETECTED (A) NOT DETECTED Final    Comment: CRITICAL RESULT CALLED TO, READ BACK BY AND VERIFIED WITH: PHARMD C. AMEND [phone] @1806  FH    Stenotrophomonas maltophilia NOT DETECTED NOT DETECTED Final   Candida albicans NOT DETECTED NOT DETECTED Final   Candida auris NOT DETECTED NOT DETECTED Final   Candida glabrata NOT DETECTED NOT DETECTED Final   Candida krusei NOT DETECTED NOT DETECTED Final   Candida parapsilosis NOT DETECTED NOT DETECTED Final   Candida tropicalis NOT DETECTED NOT DETECTED Final   Cryptococcus neoformans/gattii NOT DETECTED NOT DETECTED Final   CTX-M ESBL NOT DETECTED NOT DETECTED Final   Carbapenem resistance IMP NOT DETECTED NOT DETECTED Final   Carbapenem resistance KPC NOT DETECTED NOT DETECTED Final   Carbapenem resistance NDM NOT DETECTED NOT DETECTED Final   Carbapenem resistance VIM NOT DETECTED NOT DETECTED Final    Comment: Performed at [facility], [place] [address], [place], [place] [address]  Blood culture (routine x 2)     Status: None (Preliminary result)   Collection Time: 10/08/21 12:01 AM   Specimen: BLOOD RIGHT WRIST  Result Value Ref Range Status   Specimen Description BLOOD RIGHT WRIST  Final   Special Requests   Final    BOTTLES DRAWN AEROBIC AND ANAEROBIC Blood Culture adequate volume   Culture   Final    NO GROWTH 3 DAYS Performed at [facility], [place] [address], [place], [place] [address]    Report Status PENDING  Incomplete  Urine Culture     Status: None   Collection Time: 10/09/21  6:29 PM   Specimen: Urine, Clean Catch  Result Value Ref Range Status   Specimen Description URINE, CLEAN CATCH  Final   Special Requests NONE  Final   Culture   Final    NO GROWTH Performed at [facility], [place] [address], [place], [place] [address]    Report Status  10/11/2021 FINAL  Final     Labs: BNP (last 3 results) No results for input(s): "BNP" in the last 8760 hours. Basic Metabolic Panel: Recent Labs  Lab 10/07/21 2040 10/08/21 0545 10/09/21 0539 10/11/21 0327  NA 136 137 137 136  K 4.4 4.3 3.7 4.2  CL 101 105 105 107  CO2 26 27 24 22   GLUCOSE 149* 130* 112* 106*  BUN 53* 45* 34* 45*  CREATININE 1.25* 1.08* 1.00 1.26*  CALCIUM 10.2 9.1 9.1 8.9  MG  --   --  1.9 1.8  PHOS  --   --  2.7 3.8   Liver Function Tests: Recent Labs  Lab 10/07/21 2040 10/08/21 0545 10/11/21 0327  AST 33 26 29  ALT 20 17 18   ALKPHOS 73 53 51  BILITOT 0.8 1.0 0.3  PROT 7.1 6.2* 5.9*  ALBUMIN 3.8 2.9* 2.5*   No results for input(s): "LIPASE", "AMYLASE" in the last 168 hours. No results for input(s): "AMMONIA" in the last 168 hours. CBC: Recent Labs  Lab 10/07/21 2040 10/08/21 0545 10/09/21 0539 10/11/21 0327  WBC 17.2* 20.3* 11.4* 6.5  NEUTROABS 15.9*  --  9.7* 4.5  HGB 11.8* 10.4* 9.8* 9.9*  HCT 36.5 33.4* 30.2* 31.7*  MCV 85.5 86.3 84.1 87.1  PLT 219 189 171 186   Cardiac Enzymes: No results for input(s): "CKTOTAL", "CKMB", "CKMBINDEX", "TROPONINI" in the last 168 hours. BNP: Invalid input(s): "POCBNP" CBG: No results for input(s): "GLUCAP" in the last 168 hours. D-Dimer No results for input(s): "DDIMER" in the last 72 hours. Hgb A1c No results for input(s): "HGBA1C" in the last 72 hours. Lipid Profile No results for input(s): "CHOL", "HDL", "LDLCALC", "TRIG", "CHOLHDL", "LDLDIRECT" in the last 72 hours. Thyroid function studies No results for input(s): "TSH", "T4TOTAL", "T3FREE", "THYROIDAB" in the last 72 hours.  Invalid input(s): "FREET3" Anemia work up No results for input(s): "VITAMINB12", "FOLATE", "FERRITIN", "TIBC", "IRON", "RETICCTPCT" in the last 72 hours. Urinalysis    Component Value Date/Time   COLORURINE YELLOW 10/07/2021 2022   APPEARANCEUR CLEAR 10/07/2021 2022   LABSPEC 1.013 10/07/2021 2022   PHURINE  6.0 10/07/2021 2022   GLUCOSEU NEGATIVE 10/07/2021 2022   HGBUR MODERATE (A) 10/07/2021 2022   BILIRUBINUR NEGATIVE 10/07/2021 2022   BILIRUBINUR Pos 02/24/2015 1151   KETONESUR NEGATIVE 10/07/2021 2022   PROTEINUR 100 (A) 10/07/2021 2022   UROBILINOGEN 0.2 02/24/2015 1151   UROBILINOGEN 1.0 08/25/2012 1929   NITRITE  NEGATIVE 10/07/2021 2022   LEUKOCYTESUR NEGATIVE 10/07/2021 2022   Sepsis Labs Recent Labs  Lab 10/07/21 2040 10/08/21 0545 10/09/21 0539 10/11/21 0327  WBC 17.2* 20.3* 11.4* 6.5   Microbiology Recent Results (from the past 240 hour(s))  Urine Culture     Status: None   Collection Time: 10/07/21  8:22 PM   Specimen: In/Out Cath Urine  Result Value Ref Range Status   Specimen Description IN/OUT CATH URINE  Final   Special Requests NONE  Final   Culture   Final    NO GROWTH Performed at [facility], [address], [place], [place] [address]    Report Status 10/08/2021 FINAL  Final  SARS Coronavirus 2 by RT PCR (hospital order, performed in [facility] hospital lab) *cepheid single result test* Anterior Nasal Swab     Status: None   Collection Time: 10/07/21  8:40 PM   Specimen: Anterior Nasal Swab  Result Value Ref Range Status   SARS Coronavirus 2 by RT PCR NEGATIVE NEGATIVE Final    Comment: (NOTE) SARS-CoV-2 target nucleic acids are NOT DETECTED.  The SARS-CoV-2 RNA is generally detectable in upper and lower respiratory specimens during the acute phase of infection. The lowest concentration of SARS-CoV-2 viral copies this assay can detect is 250 copies / mL. A negative result does not preclude SARS-CoV-2 infection and should not be used as the sole basis for treatment or other patient management decisions.  A negative result may occur with improper specimen collection / handling, submission of specimen other than nasopharyngeal swab, presence of viral mutation(s) within the areas targeted by this assay, and inadequate number of viral  copies (<250 copies / mL). A negative result must be combined with clinical observations, patient history, and epidemiological information.  Fact Sheet for Patients:   RoadLapTop.co.za  Fact Sheet for Healthcare Providers: http://kim-miller.com/  This test is not yet approved or  cleared by the [place] FDA and has been authorized for detection and/or diagnosis of SARS-CoV-2 by FDA under an Emergency Use Authorization (EUA).  This EUA will remain in effect (meaning this test can be used) for the duration of the COVID-19 declaration under Section 564(b)(1) of the Act, 21 U.S.C. section 360bbb-3(b)(1), unless the authorization is terminated or revoked sooner.  Performed at [facility], [address], [place], [place] [address]   Blood culture (routine x 2)     Status: Abnormal   Collection Time: 10/07/21  8:40 PM   Specimen: BLOOD  Result Value Ref Range Status   Specimen Description BLOOD RIGHT ANTECUBITAL  Final   Special Requests   Final    BOTTLES DRAWN AEROBIC AND ANAEROBIC Blood Culture results may not be optimal due to an inadequate volume of blood received in culture bottles   Culture  Setup Time   Final    GRAM NEGATIVE RODS BOTTLES DRAWN AEROBIC ONLY CRITICAL RESULT CALLED TO, READ BACK BY AND VERIFIED WITH: PHARMD C. AMEND [phone] @1806  FH Performed at [facility], [address] [place], [place]    Culture PSEUDOMONAS AERUGINOSA (A)  Final   Report Status 10/10/2021 FINAL  Final   Organism ID, Bacteria PSEUDOMONAS AERUGINOSA  Final      Susceptibility   Pseudomonas aeruginosa - MIC*    CEFTAZIDIME 4 SENSITIVE Sensitive     CIPROFLOXACIN <=0.25 SENSITIVE Sensitive     GENTAMICIN <=1 SENSITIVE Sensitive     IMIPENEM 2 SENSITIVE Sensitive     PIP/TAZO 8  SENSITIVE Sensitive     CEFEPIME 2 SENSITIVE Sensitive     * PSEUDOMONAS AERUGINOSA  Blood Culture ID Panel (Reflexed)     Status: Abnormal    Collection Time: 10/07/21  8:40 PM  Result Value Ref Range Status   Enterococcus faecalis NOT DETECTED NOT DETECTED Final   Enterococcus Faecium NOT DETECTED NOT DETECTED Final   Listeria monocytogenes NOT DETECTED NOT DETECTED Final   Staphylococcus species NOT DETECTED NOT DETECTED Final   Staphylococcus aureus (BCID) NOT DETECTED NOT DETECTED Final   Staphylococcus epidermidis NOT DETECTED NOT DETECTED Final   Staphylococcus lugdunensis NOT DETECTED NOT DETECTED Final   Streptococcus species NOT DETECTED NOT DETECTED Final   Streptococcus agalactiae NOT DETECTED NOT DETECTED Final   Streptococcus pneumoniae NOT DETECTED NOT DETECTED Final   Streptococcus pyogenes NOT DETECTED NOT DETECTED Final   A.calcoaceticus-baumannii NOT DETECTED NOT DETECTED Final   Bacteroides fragilis NOT DETECTED NOT DETECTED Final   Enterobacterales NOT DETECTED NOT DETECTED Final   Enterobacter cloacae complex NOT DETECTED NOT DETECTED Final   Escherichia coli NOT DETECTED NOT DETECTED Final   Klebsiella aerogenes NOT DETECTED NOT DETECTED Final   Klebsiella oxytoca NOT DETECTED NOT DETECTED Final   Klebsiella pneumoniae NOT DETECTED NOT DETECTED Final   Proteus species NOT DETECTED NOT DETECTED Final   Salmonella species NOT DETECTED NOT DETECTED Final   Serratia marcescens NOT DETECTED NOT DETECTED Final   Haemophilus influenzae NOT DETECTED NOT DETECTED Final   Neisseria meningitidis NOT DETECTED NOT DETECTED Final   Pseudomonas aeruginosa DETECTED (A) NOT DETECTED Final    Comment: CRITICAL RESULT CALLED TO, READ BACK BY AND VERIFIED WITH: PHARMD C. AMEND [phone] @1806  FH    Stenotrophomonas maltophilia NOT DETECTED NOT DETECTED Final   Candida albicans NOT DETECTED NOT DETECTED Final   Candida auris NOT DETECTED NOT DETECTED Final   Candida glabrata NOT DETECTED NOT DETECTED Final   Candida krusei NOT DETECTED NOT DETECTED Final   Candida parapsilosis NOT DETECTED NOT DETECTED Final   Candida  tropicalis NOT DETECTED NOT DETECTED Final   Cryptococcus neoformans/gattii NOT DETECTED NOT DETECTED Final   CTX-M ESBL NOT DETECTED NOT DETECTED Final   Carbapenem resistance IMP NOT DETECTED NOT DETECTED Final   Carbapenem resistance KPC NOT DETECTED NOT DETECTED Final   Carbapenem resistance NDM NOT DETECTED NOT DETECTED Final   Carbapenem resistance VIM NOT DETECTED NOT DETECTED Final    Comment: Performed at [facility], [place] [address] [place], [place] [address]  Blood culture (routine x 2)     Status: None (Preliminary result)   Collection Time: 10/08/21 12:01 AM   Specimen: BLOOD RIGHT WRIST  Result Value Ref Range Status   Specimen Description BLOOD RIGHT WRIST  Final   Special Requests   Final    BOTTLES DRAWN AEROBIC AND ANAEROBIC Blood Culture adequate volume   Culture   Final    NO GROWTH 3 DAYS Performed at [facility], [place] [address], [place], [place] [address]    Report Status PENDING  Incomplete  Urine Culture     Status: None   Collection Time: 10/09/21  6:29 PM   Specimen: Urine, Clean Catch  Result Value Ref Range Status   Specimen Description URINE, CLEAN CATCH  Final   Special Requests NONE  Final   Culture   Final    NO GROWTH Performed at [facility], [place] [address], [place], [place] [address]    Report Status 10/11/2021 FINAL  Final     Time coordinating discharge:  35 minutes  SIGNED:   Barb Merino, MD  [facility] 10/11/2021, 11:28 AM

## 2021-10-13 LAB — CULTURE, BLOOD (ROUTINE X 2)
Culture: NO GROWTH
Special Requests: ADEQUATE

## 2021-10-17 DIAGNOSIS — I639 Cerebral infarction, unspecified: Secondary | ICD-10-CM | POA: Diagnosis not present

## 2021-10-17 DIAGNOSIS — B965 Pseudomonas (aeruginosa) (mallei) (pseudomallei) as the cause of diseases classified elsewhere: Secondary | ICD-10-CM | POA: Diagnosis not present

## 2021-10-17 DIAGNOSIS — N179 Acute kidney failure, unspecified: Secondary | ICD-10-CM | POA: Diagnosis not present

## 2021-10-17 DIAGNOSIS — R600 Localized edema: Secondary | ICD-10-CM | POA: Diagnosis not present

## 2021-10-17 DIAGNOSIS — Z09 Encounter for follow-up examination after completed treatment for conditions other than malignant neoplasm: Secondary | ICD-10-CM | POA: Diagnosis not present

## 2021-10-17 DIAGNOSIS — R7881 Bacteremia: Secondary | ICD-10-CM | POA: Diagnosis not present

## 2021-10-24 ENCOUNTER — Emergency Department (HOSPITAL_COMMUNITY): Payer: Medicare PPO

## 2021-10-24 ENCOUNTER — Other Ambulatory Visit: Payer: Self-pay

## 2021-10-24 ENCOUNTER — Encounter (HOSPITAL_COMMUNITY): Payer: Self-pay

## 2021-10-24 ENCOUNTER — Emergency Department (HOSPITAL_COMMUNITY)
Admission: EM | Admit: 2021-10-24 | Discharge: 2021-11-02 | Disposition: E | Payer: Medicare PPO | Attending: Emergency Medicine | Admitting: Emergency Medicine

## 2021-10-24 DIAGNOSIS — Z7982 Long term (current) use of aspirin: Secondary | ICD-10-CM | POA: Insufficient documentation

## 2021-10-24 DIAGNOSIS — I7101 Dissection of ascending aorta: Secondary | ICD-10-CM | POA: Diagnosis not present

## 2021-10-24 DIAGNOSIS — N189 Chronic kidney disease, unspecified: Secondary | ICD-10-CM | POA: Diagnosis not present

## 2021-10-24 DIAGNOSIS — Z7902 Long term (current) use of antithrombotics/antiplatelets: Secondary | ICD-10-CM | POA: Insufficient documentation

## 2021-10-24 DIAGNOSIS — I314 Cardiac tamponade: Secondary | ICD-10-CM | POA: Diagnosis not present

## 2021-10-24 DIAGNOSIS — R197 Diarrhea, unspecified: Secondary | ICD-10-CM | POA: Diagnosis not present

## 2021-10-24 DIAGNOSIS — E1151 Type 2 diabetes mellitus with diabetic peripheral angiopathy without gangrene: Secondary | ICD-10-CM | POA: Diagnosis not present

## 2021-10-24 DIAGNOSIS — I129 Hypertensive chronic kidney disease with stage 1 through stage 4 chronic kidney disease, or unspecified chronic kidney disease: Secondary | ICD-10-CM | POA: Insufficient documentation

## 2021-10-24 DIAGNOSIS — D631 Anemia in chronic kidney disease: Secondary | ICD-10-CM | POA: Diagnosis not present

## 2021-10-24 DIAGNOSIS — F0390 Unspecified dementia without behavioral disturbance: Secondary | ICD-10-CM | POA: Diagnosis not present

## 2021-10-24 DIAGNOSIS — I959 Hypotension, unspecified: Secondary | ICD-10-CM | POA: Diagnosis not present

## 2021-10-24 DIAGNOSIS — R0602 Shortness of breath: Secondary | ICD-10-CM | POA: Insufficient documentation

## 2021-10-24 DIAGNOSIS — R079 Chest pain, unspecified: Secondary | ICD-10-CM | POA: Diagnosis not present

## 2021-10-24 DIAGNOSIS — R61 Generalized hyperhidrosis: Secondary | ICD-10-CM | POA: Diagnosis not present

## 2021-10-24 DIAGNOSIS — R0789 Other chest pain: Secondary | ICD-10-CM | POA: Diagnosis not present

## 2021-10-24 DIAGNOSIS — E1122 Type 2 diabetes mellitus with diabetic chronic kidney disease: Secondary | ICD-10-CM | POA: Insufficient documentation

## 2021-10-24 LAB — I-STAT CHEM 8, ED
BUN: 60 mg/dL — ABNORMAL HIGH (ref 8–23)
Calcium, Ion: 1.08 mmol/L — ABNORMAL LOW (ref 1.15–1.40)
Chloride: 104 mmol/L (ref 98–111)
Creatinine, Ser: 1.3 mg/dL — ABNORMAL HIGH (ref 0.44–1.00)
Glucose, Bld: 161 mg/dL — ABNORMAL HIGH (ref 70–99)
HCT: 32 % — ABNORMAL LOW (ref 36.0–46.0)
Hemoglobin: 10.9 g/dL — ABNORMAL LOW (ref 12.0–15.0)
Potassium: 5 mmol/L (ref 3.5–5.1)
Sodium: 134 mmol/L — ABNORMAL LOW (ref 135–145)
TCO2: 24 mmol/L (ref 22–32)

## 2021-10-24 LAB — CBC
HCT: 31.5 % — ABNORMAL LOW (ref 36.0–46.0)
Hemoglobin: 9.8 g/dL — ABNORMAL LOW (ref 12.0–15.0)
MCH: 27.4 pg (ref 26.0–34.0)
MCHC: 31.1 g/dL (ref 30.0–36.0)
MCV: 88 fL (ref 80.0–100.0)
Platelets: 133 10*3/uL — ABNORMAL LOW (ref 150–400)
RBC: 3.58 MIL/uL — ABNORMAL LOW (ref 3.87–5.11)
RDW: 15.3 % (ref 11.5–15.5)
WBC: 10.2 10*3/uL (ref 4.0–10.5)
nRBC: 0 % (ref 0.0–0.2)

## 2021-10-24 LAB — COMPREHENSIVE METABOLIC PANEL
ALT: 292 U/L — ABNORMAL HIGH (ref 0–44)
AST: 424 U/L — ABNORMAL HIGH (ref 15–41)
Albumin: 3.1 g/dL — ABNORMAL LOW (ref 3.5–5.0)
Alkaline Phosphatase: 74 U/L (ref 38–126)
Anion gap: 11 (ref 5–15)
BUN: 46 mg/dL — ABNORMAL HIGH (ref 8–23)
CO2: 21 mmol/L — ABNORMAL LOW (ref 22–32)
Calcium: 9.1 mg/dL (ref 8.9–10.3)
Chloride: 103 mmol/L (ref 98–111)
Creatinine, Ser: 1.39 mg/dL — ABNORMAL HIGH (ref 0.44–1.00)
GFR, Estimated: 35 mL/min — ABNORMAL LOW (ref >60)
Glucose, Bld: 167 mg/dL — ABNORMAL HIGH (ref 70–99)
Potassium: 5.1 mmol/L (ref 3.5–5.1)
Sodium: 135 mmol/L (ref 135–145)
Total Bilirubin: 1.1 mg/dL (ref 0.3–1.2)
Total Protein: 6 g/dL — ABNORMAL LOW (ref 6.5–8.1)

## 2021-10-24 LAB — MAGNESIUM: Magnesium: 2 mg/dL (ref 1.7–2.4)

## 2021-10-24 LAB — PROTIME-INR
INR: 1.9 — ABNORMAL HIGH (ref 0.8–1.2)
Prothrombin Time: 21.3 seconds — ABNORMAL HIGH (ref 11.4–15.2)

## 2021-10-24 LAB — BRAIN NATRIURETIC PEPTIDE: B Natriuretic Peptide: 285.8 pg/mL — ABNORMAL HIGH (ref 0.0–100.0)

## 2021-10-24 LAB — TROPONIN I (HIGH SENSITIVITY): Troponin I (High Sensitivity): 44 ng/L — ABNORMAL HIGH (ref <18)

## 2021-10-24 MED ORDER — NOREPINEPHRINE 4 MG/250ML-% IV SOLN
0.0000 ug/min | INTRAVENOUS | Status: DC
  Administered 2021-10-24: 2 ug/min via INTRAVENOUS
  Filled 2021-10-24: qty 250

## 2021-10-25 LAB — ECHOCARDIOGRAM LIMITED
Height: 56 in
Weight: 1918.88 oz
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
## 2021-11-02 NOTE — Progress Notes (Signed)
   2021/11/22 2010  Clinical Encounter Type  Visited With Family;Patient not available  Visit Type Death;Initial;Spiritual support  Referral From Other (Comment) (Security)  Consult/Referral To Chaplain Gelene Mink [employer])  Recommendations Family Grief Support  Spiritual Encounters  Spiritual Needs Emotional;Grief support;Prayer   Chaplain responded to page to provide Emotional and Spiritual Grief Support to family of Tanaya Dunigan. Chaplain provided meaningful presence and reflective listening as family shared stories of the life of Ms. Granville as devoted mother, spiritual leader, and Christella Scheuermann of her family. At families request we shared in intercessory prayer for comfort, mercy, and peace for the family as mourn their loss. [address] [place], [place] [phone]

## 2021-11-02 NOTE — ED Notes (Addendum)
Cardiologist at bedside report that the patient is expired at [employer]

## 2021-11-02 NOTE — ED Notes (Signed)
Family at bedside at this time

## 2021-11-02 NOTE — ED Notes (Addendum)
Provider Dr Durwin Nora notified of hypotension and reports to start levophed

## 2021-11-02 NOTE — Progress Notes (Signed)
   06-Nov-2021 1826  Clinical Encounter Type  Visited With Patient not available;Health care provider  Visit Type Death;Initial  Referral From Physician;Nurse (Dr. Gloris Manchester, MD; Lona Millard, RN)  Consult/Referral To Chaplain Benetta Spar)  Recommendations Patient Deceased   Chaplain paged to [facility] E.D. Room 22. Arrived at room 1836 and informed by nurse that patient expired at 63 with family at bedside. Family had already departed. [address] [place], [place] [phone]
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
## 2021-11-02 NOTE — Progress Notes (Signed)
  Echocardiogram 2D Echocardiogram has been performed.  Gerda Diss 10/26/2021, 6:37 PM

## 2021-11-02 NOTE — ED Notes (Signed)
Tech at bedside to preform EKG and place purewick at this time

## 2021-11-02 NOTE — ED Notes (Addendum)
Family at bedside during the time that the cardiologist is at bedside preforming bedside Echo....  patient expired with 3 cardiologist at bedside who were preforming echocardiogram. Provider Dr Durwin Nora notified and daughter and son in law at the bedside. Daughter and son in law very aware of the situation and acknowledge and provided this RN with the information to get the patient to the nursing home

## 2021-11-02 NOTE — ED Notes (Signed)
Cardiologist and provider at bedside at this time
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
## 2021-11-02 NOTE — ED Provider Notes (Signed)
[facility] EMERGENCY DEPARTMENT Provider Note   CSN: [id number] Arrival date & time: 11/06/21  1628     History  Chief Complaint  Patient presents with   Shortness of Breath    Jessica Hawkins is a 88 y.o. female.   Shortness of Breath Associated symptoms: chest pain and diaphoresis   Patient presents for chest pain and shortness of breath.  Medical history includes GERD, HTN, HLD, T2DM, anemia, dementia, CKD, gout, CVA.  Symptoms occurred suddenly at home while she was having a verbal argument with her caretaker.  EMS was called to the scene.  They report that, on their arrival, she appeared pale and diaphoretic.  Vital signs were normal.  She was given 324 of ASA.  During transit, patient had progressive resolution of symptoms.  She had a recent hospitalization earlier this month.  During that time, she was found to have Pseudomonas bacteremia and CVA.  Currently, patient reports no chest pain but does endorse some mild shortness of breath.     Home Medications Prior to Admission medications   Medication Sig Start Date End Date Taking? Authorizing Provider  acetaminophen (TYLENOL) 325 MG tablet Take 650 mg by mouth every 6 (six) hours as needed for mild pain.    [provider]  allopurinol (ZYLOPRIM) 100 MG tablet Take 100 mg by mouth daily.    [provider]  amLODipine (NORVASC) 5 MG tablet Take 5 mg by mouth daily as needed (bystolic blood pressure).    [provider]  Artificial Tear Solution (SYSTANE CONTACTS OP) Place 1 drop into both eyes 2 (two) times daily.    [provider]  aspirin 81 MG chewable tablet Chew 1 tablet (81 mg total) by mouth daily for 21 days. 10/11/21 11/01/21  Dorcas Carrow, MD  clopidogrel (PLAVIX) 75 MG tablet Take 1 tablet (75 mg total) by mouth daily. 10/11/21 01/09/22  Dorcas Carrow, MD  coconut oil OIL 1 application as needed. 1 tsp    [provider]  Cyanocobalamin (VITAMIN B12  PO) Take 7 drops by mouth daily.    [provider]  Emollient (UDDERLY SMOOTH) CREA Apply 1 application topically 2 (two) times daily as needed (for dry skin related to healing cellulitis).    [provider]  ferrous sulfate 325 (65 FE) MG tablet Take 3 mg by mouth. Take 3 tablets daily.    [provider]  folic acid (FOLVITE) 1 MG tablet Take 1 tablet (1 mg total) by mouth daily. 04/03/16   Kimber Relic, MD  gabapentin (NEURONTIN) 100 MG capsule Take 1 capsule (100 mg total) by mouth daily for 7 days. 10/11/21 10/18/21  Dorcas Carrow, MD  glucose blood test strip One Touch Verio Test Strips. Use to test blood sugar once daily. Dx:E11.9 05/01/16   Kimber Relic, MD  levothyroxine (SYNTHROID, LEVOTHROID) 25 MCG tablet take 1 tablet by mouth once daily for THYROID SUPPLEMENT Patient taking differently: Take 25 mcg by mouth daily before breakfast. 07/11/16   Kimber Relic, MD  lidocaine (ASPERCREME W/LIDOCAINE) 4 % cream Apply 4 times daily to right shoulder Patient taking differently: Apply 1 application  topically 4 (four) times daily as needed. Apply 4 times daily to right shoulder 06/19/16   Kimber Relic, MD  Multiple Vitamin (MULTIVITAMIN WITH MINERALS) TABS tablet Take 1 tablet by mouth daily. Centrum    [provider]  NON FORMULARY Take 2 capsules by mouth 2 (two) times daily. Neurstem. 2  capsules in the morning  and 2 capsules in the evening    [provider]  Omega-3 Fatty Acids (OMEGA-3 FISH OIL PO) Take 1 tablet by mouth daily.    [provider]  [facility] DELICA LANCETS FINE MISC Use to test sugar once daily. Dx:E11.9 09/05/16   Reed, Tiffany L, DO  saccharomyces boulardii (FLORASTOR) 250 MG capsule Take 250 mg by mouth 2 (two) times daily.    [provider]  silver sulfADIAZINE (SILVADENE) 1 % cream Apply to toes once dialy as needed for any wounds. Patient taking differently: Apply 1 Application topically daily as  needed (wound care). 05/09/21   Freddie Breech, DPM  sodium chloride (OCEAN) 0.65 % SOLN nasal spray Place 2 sprays into both nostrils as needed for congestion.    [provider]  Vibegron (GEMTESA) 75 MG TABS Take 75 mg by mouth every morning. 09/20/20   [provider]  VITAMIN D, ERGOCALCIFEROL, PO Take 3 drops by mouth daily.    [provider]      Allergies    Penicillins    Review of Systems   Review of Systems  Constitutional:  Positive for diaphoresis.  Respiratory:  Positive for shortness of breath.   Cardiovascular:  Positive for chest pain.  Skin:  Positive for pallor.    Physical Exam Updated Vital Signs BP (!) 86/58   Pulse (!) 103   Temp 99.2 F (37.3 C) (Rectal)   Resp (!) 30   Ht 4\' 8"  (1.422 m)   Wt 54.4 kg   SpO2 98%   BMI 26.89 kg/m  Physical Exam Vitals and nursing note reviewed.  Constitutional:      General: She is not in acute distress.    Appearance: She is well-developed and normal weight. She is ill-appearing and diaphoretic.  HENT:     Head: Normocephalic and atraumatic.     Mouth/Throat:     Mouth: Mucous membranes are moist.     Pharynx: Oropharynx is clear.  Eyes:     Conjunctiva/sclera: Conjunctivae normal.  Neck:     Vascular: JVD present.  Cardiovascular:     Rate and Rhythm: Normal rate and regular rhythm.     Heart sounds: No murmur heard. Pulmonary:     Effort: Pulmonary effort is normal. Tachypnea present. No respiratory distress.     Breath sounds: Normal breath sounds. No decreased breath sounds, wheezing, rhonchi or rales.  Chest:     Chest wall: No tenderness.  Abdominal:     Palpations: Abdomen is soft.     Tenderness: There is no abdominal tenderness.  Musculoskeletal:        General: No swelling. Normal range of motion.     Cervical back: Normal range of motion and neck supple.     Right lower leg: No edema.     Left lower leg: No edema.  Skin:    General: Skin is warm.      Coloration: Skin is pale. Skin is not cyanotic.  Neurological:     General: No focal deficit present.     Mental Status: She is alert and oriented to person, place, and time.  Psychiatric:        Mood and Affect: Mood normal.        Behavior: Behavior normal.     ED Results / Procedures / Treatments   Labs (all labs ordered are listed, but only abnormal results are displayed) Labs Reviewed  CBC - Abnormal; Notable for  the following components:      Result Value   RBC 3.58 (*)    Hemoglobin 9.8 (*)    HCT 31.5 (*)    Platelets 133 (*)    All other components within normal limits  COMPREHENSIVE METABOLIC PANEL - Abnormal; Notable for the following components:   CO2 21 (*)    Glucose, Bld 167 (*)    BUN 46 (*)    Creatinine, Ser 1.39 (*)    Total Protein 6.0 (*)    Albumin 3.1 (*)    AST 424 (*)    ALT 292 (*)    GFR, Estimated 35 (*)    All other components within normal limits  BRAIN NATRIURETIC PEPTIDE - Abnormal; Notable for the following components:   B Natriuretic Peptide 285.8 (*)    All other components within normal limits  PROTIME-INR - Abnormal; Notable for the following components:   Prothrombin Time 21.3 (*)    INR 1.9 (*)    All other components within normal limits  I-STAT CHEM 8, ED - Abnormal; Notable for the following components:   Sodium 134 (*)    BUN 60 (*)    Creatinine, Ser 1.30 (*)    Glucose, Bld 161 (*)    Calcium, Ion 1.08 (*)    Hemoglobin 10.9 (*)    HCT 32.0 (*)    All other components within normal limits  TROPONIN I (HIGH SENSITIVITY) - Abnormal; Notable for the following components:   Troponin I (High Sensitivity) 44 (*)    All other components within normal limits  MAGNESIUM  TROPONIN I (HIGH SENSITIVITY)    EKG EKG Interpretation  Date/Time:  Wednesday October 24 2021 17:28:00 EDT Ventricular Rate:  92 PR Interval:  166 QRS Duration: 131 QT Interval:  404 QTC Calculation: 500 R Axis:   -74 Text Interpretation: Sinus  rhythm IVCD LVH with IVCD and secondary repol abnrm Borderline prolonged QT interval Confirmed by Gloris Manchesterixon, Julene Rahn ([id number]) on 11/20/2021 6:12:03 PM  Radiology DG Chest Portable 1 View  Result Date: 11/20/2021 CLINICAL DATA:  Chest pain and shortness of breath. EXAM: PORTABLE CHEST 1 VIEW COMPARISON:  Chest x-ray dated October 07, 2021. FINDINGS: The patient is rotated to the right. Stable cardiomediastinal silhouette with prominent central pulmonary arteries. No focal consolidation, pleural effusion, or pneumothorax. No acute osseous abnormality. IMPRESSION: No active disease. Electronically Signed   By: Obie DredgeWilliam T Derry M.D.   On: 009/19/2023 17:09    Procedures Procedures    Medications Ordered in ED Medications  norepinephrine (LEVOPHED) 4mg  in 250mL (0.016 mg/mL) premix infusion (0 mcg/min Intravenous Stopped 08/05/2021 1818)    ED Course/ Medical Decision Making/ A&P                           Medical Decision Making Amount and/or Complexity of Data Reviewed Labs: ordered. Radiology: ordered.  Risk Prescription drug management.   This patient presents to the ED for concern of chest pain and shortness of breath, this involves an extensive number of treatment options, and is a complaint that carries with it a high risk of complications and morbidity.  The differential diagnosis includes ACS, PE, pneumonia, aortic dissection, pericarditis, pericardial effusion   Co morbidities that complicate the patient evaluation  GERD, HTN, HLD, T2DM, anemia, dementia, CKD, gout, CVA   Additional history obtained:  Additional history obtained from patient's daughter External records from outside source obtained and reviewed including EMR   Lab Tests:  I Ordered, and personally interpreted labs.  The pertinent results include: Creatinine slightly increased from baseline, anemia is baseline, there is no leukocytosis, troponin mildly elevated, transaminases are elevated,   Imaging Studies  ordered:  I ordered imaging studies including chest x-ray I independently visualized and interpreted imaging which showed no acute findings I agree with the radiologist interpretation   Cardiac Monitoring: / EKG:  The patient was maintained on a cardiac monitor.  I personally viewed and interpreted the cardiac monitored which showed an underlying rhythm of: Sinus rhythm   Consultations Obtained:  I requested consultation with the cardiologist, Dr. Gery Pray and cardiothoracic surgeon, Dr. Dorris Fetch,  and discussed lab and imaging findings as well as pertinent plan - they recommend: Bedside evaluation by both services with stat echocardiogram   Problem List / ED Course / Critical interventions / Medication management  Patient is a pleasant 88 year old female presenting from home for sudden onset chest pain and shortness of breath.  This occurred shortly prior to arrival when she was in a verbal argument with her caregiver.  Although patient has diagnosed dementia, she is alert and oriented on arrival.  She states that her chest pain has resolved, however, she does have continued shortness of breath that she describes as mild.  Vital signs are notable for tachypnea.  On arrival, patient is normotensive with normal heart rate.  Her lungs are clear to auscultation.  EKG shows normal sinus rhythm.  Patient was given 324 ASA prior to arrival.  Laboratory work-up was initiated.  On reassessment, patient had recurrence of diaphoresis.  Bedside echocardiogram showed pericardial effusion with tamponade physiology.  At this point, patient's daughter and son-in-law were at bedside.  I relayed this new information to them.  I had a goals of care discussion.  Patient's daughter asked her mother if she would want aggressive treatment.  Patient stated that she would.  I discussed this patient with cardiothoracic surgeon on-call, Dr. Dorris Fetch and cardiologist on-call, Dr. Gery Pray.  They stated that they would order  a stat echocardiogram and come evaluate the patient at bedside.  Patient became hypotensive and Levophed was initiated.  Dr. Dorris Fetch and Dr. Benay Spice colleagues, Dr. Excell Seltzer and Dr. Izora Ribas, came to the ED.  They performed a bedside echocardiogram.  They did note pericardial effusion with tamponade physiology.  The also noted a ascending aortic dissection, which would explain the new pericardial effusion.  While they were at the bedside, patient expired.  They noted PEA at time of death.  This was followed by asystole.  Given the ultrasound findings of acute dissection, any attempts at bedside pericardiocentesis or CPR were deemed to be futile.  Family was understanding of this.  No CPR was performed.  Patient was declared dead at 18:16. I ordered medication including Levophed for potential Reevaluation of the patient after these medicines showed that the patient improved I have reviewed the patients home medicines and have made adjustments as needed  CRITICAL CARE Performed by: Gloris Manchester   Total critical care time: 34 minutes  Critical care time was exclusive of separately billable procedures and treating other patients.  Critical care was necessary to treat or prevent imminent or life-threatening deterioration.  Critical care was time spent personally by me on the following activities: development of treatment plan with patient and/or surrogate as well as nursing, discussions with consultants, evaluation of patient's response to treatment, examination of patient, obtaining history from patient or surrogate, ordering and performing treatments and interventions, ordering and review of laboratory  studies, ordering and review of radiographic studies, pulse oximetry and re-evaluation of patient's condition.         Final Clinical Impression(s) / ED Diagnoses Final diagnoses:  Cardiac tamponade    Rx / DC Orders ED Discharge Orders     None         Gloris Manchester, MD 01-Nov-2021  1909

## 2021-11-02 DEATH — deceased

## 2021-12-11 ENCOUNTER — Inpatient Hospital Stay: Payer: Medicare PPO | Admitting: Neurology

## 2021-12-18 ENCOUNTER — Ambulatory Visit: Payer: Medicare PPO | Admitting: Podiatry
